# Patient Record
Sex: Male | Born: 1965 | Race: Black or African American | Hispanic: No | Marital: Single | Smoking: Current every day smoker
Health system: Southern US, Community
[De-identification: ages and names within clinical notes are randomized; demographics above are authoritative.]

## PROBLEM LIST (undated history)

## (undated) ENCOUNTER — Emergency Department (HOSPITAL_COMMUNITY): Admission: EM | Payer: Medicare Other | Source: Home / Self Care

## (undated) DIAGNOSIS — J40 Bronchitis, not specified as acute or chronic: Secondary | ICD-10-CM

## (undated) DIAGNOSIS — F1910 Other psychoactive substance abuse, uncomplicated: Secondary | ICD-10-CM

## (undated) DIAGNOSIS — L309 Dermatitis, unspecified: Secondary | ICD-10-CM

## (undated) DIAGNOSIS — K219 Gastro-esophageal reflux disease without esophagitis: Secondary | ICD-10-CM

## (undated) DIAGNOSIS — M1990 Unspecified osteoarthritis, unspecified site: Secondary | ICD-10-CM

## (undated) DIAGNOSIS — L0390 Cellulitis, unspecified: Secondary | ICD-10-CM

## (undated) DIAGNOSIS — A159 Respiratory tuberculosis unspecified: Secondary | ICD-10-CM

## (undated) DIAGNOSIS — R0602 Shortness of breath: Secondary | ICD-10-CM

## (undated) DIAGNOSIS — R05 Cough: Secondary | ICD-10-CM

## (undated) DIAGNOSIS — I82409 Acute embolism and thrombosis of unspecified deep veins of unspecified lower extremity: Secondary | ICD-10-CM

## (undated) DIAGNOSIS — F319 Bipolar disorder, unspecified: Secondary | ICD-10-CM

## (undated) DIAGNOSIS — Z720 Tobacco use: Secondary | ICD-10-CM

## (undated) DIAGNOSIS — F1010 Alcohol abuse, uncomplicated: Secondary | ICD-10-CM

## (undated) DIAGNOSIS — R011 Cardiac murmur, unspecified: Secondary | ICD-10-CM

## (undated) DIAGNOSIS — F32A Depression, unspecified: Secondary | ICD-10-CM

## (undated) DIAGNOSIS — M2550 Pain in unspecified joint: Secondary | ICD-10-CM

## (undated) DIAGNOSIS — I2699 Other pulmonary embolism without acute cor pulmonale: Secondary | ICD-10-CM

## (undated) DIAGNOSIS — R059 Cough, unspecified: Secondary | ICD-10-CM

## (undated) DIAGNOSIS — F329 Major depressive disorder, single episode, unspecified: Secondary | ICD-10-CM

## (undated) HISTORY — DX: Acute embolism and thrombosis of unspecified deep veins of unspecified lower extremity: I82.409

## (undated) HISTORY — DX: Other psychoactive substance abuse, uncomplicated: F19.10

## (undated) HISTORY — DX: Tobacco use: Z72.0

## (undated) HISTORY — DX: Gastro-esophageal reflux disease without esophagitis: K21.9

## (undated) HISTORY — DX: Depression, unspecified: F32.A

## (undated) HISTORY — DX: Major depressive disorder, single episode, unspecified: F32.9

---
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
## 1998-10-08 ENCOUNTER — Emergency Department (HOSPITAL_COMMUNITY): Admission: EM | Admit: 1998-10-08 | Discharge: 1998-10-08 | Payer: Self-pay

## 1999-01-28 ENCOUNTER — Emergency Department (HOSPITAL_COMMUNITY): Admission: EM | Admit: 1999-01-28 | Discharge: 1999-01-28 | Payer: Self-pay | Admitting: Emergency Medicine

## 1999-08-13 ENCOUNTER — Emergency Department (HOSPITAL_COMMUNITY): Admission: EM | Admit: 1999-08-13 | Discharge: 1999-08-13 | Payer: Self-pay | Admitting: Emergency Medicine

## 1999-08-13 ENCOUNTER — Encounter: Payer: Self-pay | Admitting: Emergency Medicine

## 1999-12-09 ENCOUNTER — Emergency Department (HOSPITAL_COMMUNITY): Admission: EM | Admit: 1999-12-09 | Discharge: 1999-12-09 | Payer: Self-pay | Admitting: Emergency Medicine

## 2000-01-31 ENCOUNTER — Emergency Department (HOSPITAL_COMMUNITY): Admission: EM | Admit: 2000-01-31 | Discharge: 2000-01-31 | Payer: Self-pay | Admitting: *Deleted

## 2000-08-23 ENCOUNTER — Emergency Department (HOSPITAL_COMMUNITY): Admission: EM | Admit: 2000-08-23 | Discharge: 2000-08-23 | Payer: Self-pay | Admitting: Emergency Medicine

## 2001-05-29 ENCOUNTER — Emergency Department (HOSPITAL_COMMUNITY): Admission: EM | Admit: 2001-05-29 | Discharge: 2001-05-29 | Payer: Self-pay | Admitting: Emergency Medicine

## 2001-06-10 ENCOUNTER — Emergency Department (HOSPITAL_COMMUNITY): Admission: EM | Admit: 2001-06-10 | Discharge: 2001-06-10 | Payer: Self-pay | Admitting: Emergency Medicine

## 2001-08-14 ENCOUNTER — Emergency Department (HOSPITAL_COMMUNITY): Admission: EM | Admit: 2001-08-14 | Discharge: 2001-08-14 | Payer: Self-pay

## 2002-03-18 ENCOUNTER — Emergency Department (HOSPITAL_COMMUNITY): Admission: EM | Admit: 2002-03-18 | Discharge: 2002-03-18 | Payer: Self-pay | Admitting: Emergency Medicine

## 2002-03-26 ENCOUNTER — Emergency Department (HOSPITAL_COMMUNITY): Admission: EM | Admit: 2002-03-26 | Discharge: 2002-03-26 | Payer: Self-pay | Admitting: Emergency Medicine

## 2003-05-21 ENCOUNTER — Emergency Department (HOSPITAL_COMMUNITY): Admission: EM | Admit: 2003-05-21 | Discharge: 2003-05-21 | Payer: Self-pay | Admitting: Emergency Medicine

## 2004-11-25 ENCOUNTER — Emergency Department (HOSPITAL_COMMUNITY): Admission: EM | Admit: 2004-11-25 | Discharge: 2004-11-25 | Payer: Self-pay | Admitting: Emergency Medicine

## 2005-07-26 ENCOUNTER — Emergency Department (HOSPITAL_COMMUNITY): Admission: EM | Admit: 2005-07-26 | Discharge: 2005-07-26 | Payer: Self-pay | Admitting: Emergency Medicine

## 2005-08-05 ENCOUNTER — Emergency Department (HOSPITAL_COMMUNITY): Admission: EM | Admit: 2005-08-05 | Discharge: 2005-08-06 | Payer: Self-pay | Admitting: Emergency Medicine

## 2006-02-06 ENCOUNTER — Ambulatory Visit: Payer: Self-pay | Admitting: *Deleted

## 2006-02-06 ENCOUNTER — Ambulatory Visit: Payer: Self-pay | Admitting: Internal Medicine

## 2006-02-06 ENCOUNTER — Ambulatory Visit (HOSPITAL_COMMUNITY): Admission: RE | Admit: 2006-02-06 | Discharge: 2006-02-06 | Payer: Self-pay | Admitting: *Deleted

## 2006-02-06 ENCOUNTER — Encounter: Payer: Self-pay | Admitting: Vascular Surgery

## 2006-02-06 ENCOUNTER — Inpatient Hospital Stay (HOSPITAL_COMMUNITY): Admission: EM | Admit: 2006-02-06 | Discharge: 2006-02-14 | Payer: Self-pay | Admitting: *Deleted

## 2006-02-16 ENCOUNTER — Ambulatory Visit: Payer: Self-pay | Admitting: Hospitalist

## 2006-02-21 ENCOUNTER — Ambulatory Visit: Payer: Self-pay | Admitting: Hospitalist

## 2006-03-19 ENCOUNTER — Emergency Department (HOSPITAL_COMMUNITY): Admission: EM | Admit: 2006-03-19 | Discharge: 2006-03-20 | Payer: Self-pay | Admitting: Emergency Medicine

## 2006-03-20 ENCOUNTER — Inpatient Hospital Stay (HOSPITAL_COMMUNITY): Admission: EM | Admit: 2006-03-20 | Discharge: 2006-03-24 | Payer: Self-pay | Admitting: Psychiatry

## 2006-03-20 ENCOUNTER — Ambulatory Visit: Payer: Self-pay | Admitting: Psychiatry

## 2006-03-24 ENCOUNTER — Inpatient Hospital Stay (HOSPITAL_COMMUNITY): Admission: EM | Admit: 2006-03-24 | Discharge: 2006-03-28 | Payer: Self-pay | Admitting: Emergency Medicine

## 2006-04-01 ENCOUNTER — Emergency Department (HOSPITAL_COMMUNITY): Admission: EM | Admit: 2006-04-01 | Discharge: 2006-04-01 | Payer: Self-pay | Admitting: *Deleted

## 2006-04-09 ENCOUNTER — Ambulatory Visit: Payer: Self-pay | Admitting: Hospitalist

## 2006-04-16 ENCOUNTER — Ambulatory Visit: Payer: Self-pay | Admitting: Internal Medicine

## 2006-05-21 ENCOUNTER — Ambulatory Visit: Payer: Self-pay | Admitting: Internal Medicine

## 2006-07-16 ENCOUNTER — Ambulatory Visit: Payer: Self-pay | Admitting: Internal Medicine

## 2006-08-06 ENCOUNTER — Ambulatory Visit: Payer: Self-pay | Admitting: Internal Medicine

## 2006-11-26 ENCOUNTER — Ambulatory Visit: Payer: Self-pay | Admitting: Internal Medicine

## 2006-11-26 DIAGNOSIS — I82409 Acute embolism and thrombosis of unspecified deep veins of unspecified lower extremity: Secondary | ICD-10-CM | POA: Insufficient documentation

## 2006-12-05 ENCOUNTER — Ambulatory Visit: Payer: Self-pay | Admitting: Internal Medicine

## 2006-12-05 DIAGNOSIS — F319 Bipolar disorder, unspecified: Secondary | ICD-10-CM

## 2006-12-10 ENCOUNTER — Ambulatory Visit: Payer: Self-pay | Admitting: Internal Medicine

## 2007-01-07 ENCOUNTER — Encounter (INDEPENDENT_AMBULATORY_CARE_PROVIDER_SITE_OTHER): Payer: Self-pay | Admitting: *Deleted

## 2007-01-07 ENCOUNTER — Ambulatory Visit: Payer: Self-pay | Admitting: Internal Medicine

## 2007-01-07 ENCOUNTER — Ambulatory Visit (HOSPITAL_COMMUNITY): Admission: RE | Admit: 2007-01-07 | Discharge: 2007-01-07 | Payer: Self-pay | Admitting: Internal Medicine

## 2007-01-07 ENCOUNTER — Ambulatory Visit: Payer: Self-pay | Admitting: Vascular Surgery

## 2007-01-07 ENCOUNTER — Encounter: Payer: Self-pay | Admitting: Internal Medicine

## 2007-01-07 DIAGNOSIS — M79609 Pain in unspecified limb: Secondary | ICD-10-CM | POA: Insufficient documentation

## 2007-01-07 LAB — CONVERTED CEMR LAB
Hemoglobin: 16.7 g/dL (ref 13.0–17.0)
INR: 2.2
Lymphocytes Relative: 33 % (ref 12–46)
Lymphs Abs: 1.9 10*3/uL (ref 0.7–3.3)
Monocytes Absolute: 0.6 10*3/uL (ref 0.2–0.7)
Monocytes Relative: 10 % (ref 3–11)
Neutro Abs: 3.2 10*3/uL (ref 1.7–7.7)
RBC: 5.24 M/uL (ref 4.22–5.81)

## 2007-01-14 ENCOUNTER — Ambulatory Visit: Payer: Self-pay | Admitting: Hospitalist

## 2007-02-04 ENCOUNTER — Ambulatory Visit: Payer: Self-pay | Admitting: Hospitalist

## 2007-02-25 ENCOUNTER — Ambulatory Visit: Payer: Self-pay | Admitting: Internal Medicine

## 2007-02-25 LAB — CONVERTED CEMR LAB: INR: 2.9

## 2007-03-28 ENCOUNTER — Ambulatory Visit: Payer: Self-pay | Admitting: Hospitalist

## 2007-03-29 LAB — CONVERTED CEMR LAB: INR: 2.6

## 2007-06-04 ENCOUNTER — Telehealth: Payer: Self-pay | Admitting: Internal Medicine

## 2007-06-10 ENCOUNTER — Ambulatory Visit: Payer: Self-pay | Admitting: Hospitalist

## 2007-06-10 LAB — CONVERTED CEMR LAB

## 2007-07-08 ENCOUNTER — Ambulatory Visit: Payer: Self-pay | Admitting: Internal Medicine

## 2007-07-09 ENCOUNTER — Encounter (INDEPENDENT_AMBULATORY_CARE_PROVIDER_SITE_OTHER): Payer: Self-pay | Admitting: Internal Medicine

## 2007-07-09 ENCOUNTER — Ambulatory Visit: Payer: Self-pay | Admitting: Hospitalist

## 2007-07-29 ENCOUNTER — Ambulatory Visit: Payer: Self-pay | Admitting: Internal Medicine

## 2007-07-29 LAB — CONVERTED CEMR LAB

## 2007-10-07 ENCOUNTER — Ambulatory Visit: Payer: Self-pay | Admitting: Internal Medicine

## 2007-10-07 LAB — CONVERTED CEMR LAB: INR: 2.6

## 2007-12-23 ENCOUNTER — Ambulatory Visit: Payer: Self-pay | Admitting: Internal Medicine

## 2007-12-23 LAB — CONVERTED CEMR LAB

## 2008-02-03 ENCOUNTER — Ambulatory Visit: Payer: Self-pay | Admitting: Internal Medicine

## 2008-02-03 LAB — CONVERTED CEMR LAB: INR: 1.5

## 2008-06-01 ENCOUNTER — Ambulatory Visit: Payer: Self-pay | Admitting: *Deleted

## 2008-06-29 ENCOUNTER — Ambulatory Visit: Payer: Self-pay | Admitting: Infectious Diseases

## 2008-06-29 LAB — CONVERTED CEMR LAB: INR: 1.5

## 2008-07-01 ENCOUNTER — Emergency Department (HOSPITAL_COMMUNITY): Admission: EM | Admit: 2008-07-01 | Discharge: 2008-07-01 | Payer: Self-pay | Admitting: Emergency Medicine

## 2008-07-13 ENCOUNTER — Ambulatory Visit: Payer: Self-pay | Admitting: Internal Medicine

## 2008-07-13 LAB — CONVERTED CEMR LAB: INR: 2.5

## 2008-10-19 ENCOUNTER — Ambulatory Visit: Payer: Self-pay | Admitting: Internal Medicine

## 2008-10-19 ENCOUNTER — Encounter: Payer: Self-pay | Admitting: Internal Medicine

## 2008-10-19 LAB — CONVERTED CEMR LAB: INR: 2.4

## 2008-11-16 ENCOUNTER — Emergency Department (HOSPITAL_COMMUNITY): Admission: EM | Admit: 2008-11-16 | Discharge: 2008-11-16 | Payer: Self-pay | Admitting: Emergency Medicine

## 2008-11-23 ENCOUNTER — Ambulatory Visit: Payer: Self-pay | Admitting: Internal Medicine

## 2009-01-11 ENCOUNTER — Emergency Department (HOSPITAL_COMMUNITY): Admission: EM | Admit: 2009-01-11 | Discharge: 2009-01-12 | Payer: Self-pay | Admitting: Emergency Medicine

## 2009-01-12 ENCOUNTER — Ambulatory Visit: Payer: Self-pay | Admitting: Vascular Surgery

## 2009-01-12 ENCOUNTER — Ambulatory Visit: Payer: Self-pay | Admitting: Infectious Diseases

## 2009-01-12 ENCOUNTER — Encounter (INDEPENDENT_AMBULATORY_CARE_PROVIDER_SITE_OTHER): Payer: Self-pay | Admitting: Emergency Medicine

## 2009-01-12 ENCOUNTER — Ambulatory Visit (HOSPITAL_COMMUNITY): Admission: RE | Admit: 2009-01-12 | Discharge: 2009-01-12 | Payer: Self-pay | Admitting: Emergency Medicine

## 2009-01-12 ENCOUNTER — Telehealth (INDEPENDENT_AMBULATORY_CARE_PROVIDER_SITE_OTHER): Payer: Self-pay | Admitting: Internal Medicine

## 2009-01-14 ENCOUNTER — Encounter: Payer: Self-pay | Admitting: Internal Medicine

## 2009-01-25 ENCOUNTER — Ambulatory Visit: Payer: Self-pay | Admitting: Internal Medicine

## 2009-01-25 LAB — CONVERTED CEMR LAB: INR: 3.6

## 2009-02-16 ENCOUNTER — Emergency Department (HOSPITAL_COMMUNITY): Admission: EM | Admit: 2009-02-16 | Discharge: 2009-02-17 | Payer: Self-pay | Admitting: Emergency Medicine

## 2009-02-22 ENCOUNTER — Ambulatory Visit: Payer: Self-pay | Admitting: Internal Medicine

## 2009-02-22 LAB — CONVERTED CEMR LAB: INR: 5.6

## 2009-03-02 ENCOUNTER — Emergency Department (HOSPITAL_COMMUNITY): Admission: EM | Admit: 2009-03-02 | Discharge: 2009-03-02 | Payer: Self-pay | Admitting: Emergency Medicine

## 2009-03-22 ENCOUNTER — Ambulatory Visit: Payer: Self-pay | Admitting: Infectious Diseases

## 2009-03-22 LAB — CONVERTED CEMR LAB: INR: 3.3

## 2009-04-05 ENCOUNTER — Ambulatory Visit: Payer: Self-pay | Admitting: Infectious Diseases

## 2009-04-05 LAB — CONVERTED CEMR LAB

## 2009-04-26 ENCOUNTER — Ambulatory Visit: Payer: Self-pay | Admitting: Internal Medicine

## 2009-07-12 ENCOUNTER — Emergency Department (HOSPITAL_COMMUNITY): Admission: EM | Admit: 2009-07-12 | Discharge: 2009-07-12 | Payer: Self-pay | Admitting: Emergency Medicine

## 2009-08-02 ENCOUNTER — Ambulatory Visit: Payer: Self-pay | Admitting: Internal Medicine

## 2009-08-23 ENCOUNTER — Ambulatory Visit: Payer: Self-pay | Admitting: Internal Medicine

## 2009-08-23 LAB — CONVERTED CEMR LAB: INR: 3.3

## 2009-09-03 ENCOUNTER — Ambulatory Visit: Payer: Self-pay | Admitting: Internal Medicine

## 2009-09-03 LAB — CONVERTED CEMR LAB
Cholesterol: 241 mg/dL — ABNORMAL HIGH (ref 0–200)
HDL: 40 mg/dL (ref >39)
Triglycerides: 281 mg/dL — ABNORMAL HIGH (ref <150)

## 2009-10-31 ENCOUNTER — Emergency Department (HOSPITAL_COMMUNITY): Admission: EM | Admit: 2009-10-31 | Discharge: 2009-10-31 | Payer: Self-pay | Admitting: Emergency Medicine

## 2009-11-08 ENCOUNTER — Ambulatory Visit: Payer: Self-pay | Admitting: Internal Medicine

## 2009-11-23 ENCOUNTER — Emergency Department (HOSPITAL_COMMUNITY): Admission: EM | Admit: 2009-11-23 | Discharge: 2009-11-24 | Payer: Self-pay | Admitting: Emergency Medicine

## 2009-12-06 ENCOUNTER — Emergency Department (HOSPITAL_COMMUNITY): Admission: EM | Admit: 2009-12-06 | Discharge: 2009-12-06 | Payer: Self-pay | Admitting: Emergency Medicine

## 2009-12-14 ENCOUNTER — Emergency Department (HOSPITAL_COMMUNITY): Admission: EM | Admit: 2009-12-14 | Discharge: 2009-12-14 | Payer: Self-pay | Admitting: Emergency Medicine

## 2010-01-19 ENCOUNTER — Emergency Department (HOSPITAL_COMMUNITY): Admission: EM | Admit: 2010-01-19 | Discharge: 2010-01-19 | Payer: Self-pay | Admitting: Emergency Medicine

## 2010-01-31 ENCOUNTER — Telehealth (INDEPENDENT_AMBULATORY_CARE_PROVIDER_SITE_OTHER): Payer: Self-pay | Admitting: *Deleted

## 2010-02-11 ENCOUNTER — Emergency Department (HOSPITAL_COMMUNITY): Admission: EM | Admit: 2010-02-11 | Discharge: 2010-02-11 | Payer: Self-pay | Admitting: Emergency Medicine

## 2010-02-13 ENCOUNTER — Emergency Department (HOSPITAL_COMMUNITY): Admission: EM | Admit: 2010-02-13 | Discharge: 2010-02-14 | Payer: Self-pay | Admitting: Emergency Medicine

## 2010-02-14 ENCOUNTER — Ambulatory Visit: Payer: Self-pay | Admitting: Internal Medicine

## 2010-02-14 LAB — CONVERTED CEMR LAB: INR: 2.1

## 2010-02-20 ENCOUNTER — Emergency Department (HOSPITAL_COMMUNITY): Admission: EM | Admit: 2010-02-20 | Discharge: 2010-02-20 | Payer: Self-pay | Admitting: Emergency Medicine

## 2010-04-03 ENCOUNTER — Emergency Department (HOSPITAL_COMMUNITY): Admission: EM | Admit: 2010-04-03 | Discharge: 2010-04-03 | Payer: Self-pay | Admitting: Emergency Medicine

## 2010-04-11 ENCOUNTER — Emergency Department (HOSPITAL_COMMUNITY): Admission: EM | Admit: 2010-04-11 | Discharge: 2010-04-11 | Payer: Self-pay | Admitting: Family Medicine

## 2010-04-18 ENCOUNTER — Emergency Department (HOSPITAL_COMMUNITY): Admission: EM | Admit: 2010-04-18 | Discharge: 2010-04-19 | Payer: Self-pay | Admitting: Emergency Medicine

## 2010-04-19 ENCOUNTER — Ambulatory Visit: Payer: Self-pay | Admitting: Internal Medicine

## 2010-04-19 ENCOUNTER — Inpatient Hospital Stay (HOSPITAL_COMMUNITY): Admission: AD | Admit: 2010-04-19 | Discharge: 2010-04-23 | Payer: Self-pay | Admitting: Internal Medicine

## 2010-04-19 ENCOUNTER — Encounter: Payer: Self-pay | Admitting: Internal Medicine

## 2010-04-19 ENCOUNTER — Telehealth: Payer: Self-pay | Admitting: Internal Medicine

## 2010-04-23 ENCOUNTER — Encounter: Payer: Self-pay | Admitting: Internal Medicine

## 2010-04-23 DIAGNOSIS — J45901 Unspecified asthma with (acute) exacerbation: Secondary | ICD-10-CM | POA: Insufficient documentation

## 2010-04-23 DIAGNOSIS — J40 Bronchitis, not specified as acute or chronic: Secondary | ICD-10-CM

## 2010-05-02 ENCOUNTER — Ambulatory Visit: Payer: Self-pay | Admitting: Internal Medicine

## 2010-05-02 DIAGNOSIS — J4540 Moderate persistent asthma, uncomplicated: Secondary | ICD-10-CM

## 2010-05-02 DIAGNOSIS — K219 Gastro-esophageal reflux disease without esophagitis: Secondary | ICD-10-CM | POA: Insufficient documentation

## 2010-05-02 LAB — CONVERTED CEMR LAB: INR: 1.8

## 2010-05-05 ENCOUNTER — Emergency Department (HOSPITAL_COMMUNITY): Admission: EM | Admit: 2010-05-05 | Discharge: 2010-05-05 | Payer: Self-pay | Admitting: Family Medicine

## 2010-05-06 ENCOUNTER — Ambulatory Visit (HOSPITAL_COMMUNITY): Admission: RE | Admit: 2010-05-06 | Discharge: 2010-05-06 | Payer: Self-pay | Admitting: Internal Medicine

## 2010-05-06 ENCOUNTER — Encounter: Payer: Self-pay | Admitting: Internal Medicine

## 2010-05-09 ENCOUNTER — Ambulatory Visit: Payer: Self-pay | Admitting: Internal Medicine

## 2010-05-09 LAB — CONVERTED CEMR LAB: INR: 2.3

## 2010-05-14 ENCOUNTER — Emergency Department (HOSPITAL_COMMUNITY)
Admission: EM | Admit: 2010-05-14 | Discharge: 2010-05-15 | Payer: Self-pay | Source: Home / Self Care | Admitting: Emergency Medicine

## 2010-05-23 ENCOUNTER — Ambulatory Visit: Payer: Self-pay | Admitting: Internal Medicine

## 2010-06-07 ENCOUNTER — Emergency Department (HOSPITAL_COMMUNITY)
Admission: EM | Admit: 2010-06-07 | Discharge: 2010-06-07 | Payer: Self-pay | Source: Home / Self Care | Admitting: Emergency Medicine

## 2010-06-09 ENCOUNTER — Emergency Department (HOSPITAL_COMMUNITY)
Admission: EM | Admit: 2010-06-09 | Discharge: 2010-06-09 | Payer: Self-pay | Source: Home / Self Care | Admitting: Emergency Medicine

## 2010-06-16 ENCOUNTER — Emergency Department (HOSPITAL_COMMUNITY)
Admission: EM | Admit: 2010-06-16 | Discharge: 2010-06-16 | Payer: Self-pay | Source: Home / Self Care | Admitting: Family Medicine

## 2010-06-20 ENCOUNTER — Emergency Department (HOSPITAL_COMMUNITY)
Admission: EM | Admit: 2010-06-20 | Discharge: 2010-06-20 | Payer: Self-pay | Source: Home / Self Care | Admitting: Emergency Medicine

## 2010-06-23 ENCOUNTER — Emergency Department (HOSPITAL_COMMUNITY)
Admission: EM | Admit: 2010-06-23 | Discharge: 2010-06-23 | Payer: Self-pay | Source: Home / Self Care | Admitting: Emergency Medicine

## 2010-06-27 ENCOUNTER — Ambulatory Visit: Payer: Self-pay | Admitting: Internal Medicine

## 2010-06-27 LAB — CONVERTED CEMR LAB

## 2010-06-28 ENCOUNTER — Telehealth: Payer: Self-pay | Admitting: *Deleted

## 2010-06-28 ENCOUNTER — Emergency Department (HOSPITAL_COMMUNITY)
Admission: EM | Admit: 2010-06-28 | Discharge: 2010-06-28 | Payer: Self-pay | Source: Home / Self Care | Admitting: Emergency Medicine

## 2010-06-28 ENCOUNTER — Encounter (INDEPENDENT_AMBULATORY_CARE_PROVIDER_SITE_OTHER): Payer: Self-pay | Admitting: Emergency Medicine

## 2010-07-01 ENCOUNTER — Emergency Department (HOSPITAL_COMMUNITY)
Admission: EM | Admit: 2010-07-01 | Discharge: 2010-07-01 | Payer: Self-pay | Source: Home / Self Care | Admitting: Emergency Medicine

## 2010-07-04 ENCOUNTER — Emergency Department (HOSPITAL_COMMUNITY)
Admission: EM | Admit: 2010-07-04 | Discharge: 2010-07-05 | Disposition: A | Discharge status: Other Acute Inpatient Hospital | Payer: Self-pay | Source: Home / Self Care | Admitting: Emergency Medicine

## 2010-07-05 ENCOUNTER — Ambulatory Visit: Payer: Self-pay

## 2010-07-05 ENCOUNTER — Encounter: Payer: Self-pay | Admitting: Internal Medicine

## 2010-07-05 ENCOUNTER — Observation Stay (HOSPITAL_COMMUNITY)
Admission: EM | Admit: 2010-07-05 | Discharge: 2010-07-09 | Payer: Self-pay | Attending: Internal Medicine | Admitting: Internal Medicine

## 2010-07-09 ENCOUNTER — Encounter: Payer: Self-pay | Admitting: Ophthalmology

## 2010-07-12 ENCOUNTER — Telehealth: Payer: Self-pay | Admitting: Internal Medicine

## 2010-07-13 ENCOUNTER — Ambulatory Visit: Admission: RE | Admit: 2010-07-13 | Discharge: 2010-07-13 | Payer: Self-pay | Source: Home / Self Care

## 2010-07-13 DIAGNOSIS — F1910 Other psychoactive substance abuse, uncomplicated: Secondary | ICD-10-CM | POA: Insufficient documentation

## 2010-07-18 ENCOUNTER — Ambulatory Visit: Admission: RE | Admit: 2010-07-18 | Discharge: 2010-07-18 | Payer: Self-pay | Source: Home / Self Care

## 2010-07-18 LAB — CONVERTED CEMR LAB: INR: 1.5

## 2010-07-29 DIAGNOSIS — Z7901 Long term (current) use of anticoagulants: Secondary | ICD-10-CM | POA: Insufficient documentation

## 2010-07-29 DIAGNOSIS — I82409 Acute embolism and thrombosis of unspecified deep veins of unspecified lower extremity: Secondary | ICD-10-CM

## 2010-08-01 ENCOUNTER — Ambulatory Visit: Admit: 2010-08-01 | Payer: Self-pay

## 2010-08-03 ENCOUNTER — Emergency Department (HOSPITAL_COMMUNITY)
Admission: EM | Admit: 2010-08-03 | Discharge: 2010-08-03 | Payer: Self-pay | Source: Home / Self Care | Admitting: Emergency Medicine

## 2010-08-09 NOTE — Assessment & Plan Note (Signed)
Summary: [name]  Anticoagulant Therapy Managed by: [name]. [name] [name]  PharmD CACP PCP: [name]  DO [facility] Attending: [name] MD, [name] Indication 1: Deep vein thrombus Indication 2: Aftercare long term use Anticoagulants V58.61,V58.83 Start date: 03/21/2006 Duration: 1 year  Patient Assessment Reviewed by: [name] PharmD  August 23, 2009 Medication review: verified warfarin dosage & schedule,verified previous prescription medications, verified doses & any changes, verified new medications, reviewed OTC medications, reviewed OTC health products-vitamins supplements etc Complications: none Dietary changes: none   Health status changes: none   Lifestyle changes: none   Recent/future hospitalizations: none   Recent/future procedures: none   Recent/future dental: none Patient Assessment Part 2:  Have you MISSED ANY DOSES or CHANGED TABLETS?  No missed Warfarin doses or changed tablets.  Have you had any BRUISING or BLEEDING ( nose or gum bleeds,blood in urine or stool)?  No reported bruising or bleeding in nose, gums, urine, stool.  Have you STARTED or STOPPED any MEDICATIONS, including OTC meds,herbals or supplements?  No other medications or herbal supplements were started or stopped.  Have you CHANGED your DIET, especially green vegetables,or ALCOHOL intake?  No changes in diet or alcohol intake.  Have you had any ILLNESSES or HOSPITALIZATIONS?  No reported illnesses or hospitalizations  Have you had any signs of CLOTTING?(chest discomfort,dizziness,shortness of breath,arms tingling,slurred speech,swelling or redness in leg)    No chest discomfort, dizziness, shortness of breath, tingling in arm, slurred speech, swelling, or redness in leg.     Treatment  Target INR: 2.0-3.0 INR: 3.3  Date: 08/23/2009 Regimen In:  95.0mg /week INR reflects regimen in: 3.3  New  Tablet strength: : 5mg  Regimen Out:     Sunday: 2 & 1/2 Tablet     Monday: 2 & 1/2 Tablet  Tuesday: 2 & 1/2 Tablet     Wednesday: 3 Tablet     Thursday: 2 & 1/2 Tablet      Friday: 2 & 1/2 Tablet     Saturday: 2 & 1/2 Tablet Total Weekly: 90.0mg /week mg  Next INR Due: 09/20/2009 Adjusted by: [name]. Alexandria Lodge III PharmD CACP   Return to anticoagulation clinic:  09/20/2009 Time of next visit: 1130    Allergies: No Known Drug Allergies

## 2010-08-09 NOTE — Progress Notes (Signed)
Summary: Refill/gh  Phone Note Refill Request Message from:  Patient on January 31, 2010 3:03 PM  Refills Requested: Medication #1:  WARFARIN SODIUM 5 MG TABS Take as directed. Last vivit to [facility] was 11/08/2009   Method Requested: Electronic Initial call taken by: Angelina Ok RN,  January 31, 2010 3:03 PM    Prescriptions: WARFARIN SODIUM 5 MG TABS (WARFARIN SODIUM) Take as directed.  #150 Each x 1   Entered and Authorized by:   Zoila Shutter MD   Signed by:   Zoila Shutter MD on 01/31/2010   Method used:   Electronically to        [facility] [phone]* (retail)       [address]       [place], [place]  [address]       Ph: [phone]       Fax: [phone]   RxID:   [id number]

## 2010-08-09 NOTE — Assessment & Plan Note (Signed)
Summary: FU VISIT/DS   Vital Signs:  Patient profile:   45 year old male Height:      76 inches (193.04 cm) Weight:      235.8 pounds (107.18 kg) BMI:     28.81 Temp:     97.3 degrees F (36.28 degrees C) Pulse rate:   82 / minute BP sitting:   119 / 71  (left arm) Cuff size:   regular  Vitals Entered By: Krystal Eaton Duncan Dull) (September 03, 2009 2:48 PM) Is Patient Diabetic? No Pain Assessment Patient in pain? yes     Location: right leg Intensity: 3 Type: sharp Onset of pain  Intermittent s/p dvt 43yrs ago  Have you ever been in a relationship where you felt threatened, hurt or afraid?No   Does patient need assistance? Functional Status Self care Ambulation Normal   Primary Care Provider:  [name]  DO   History of Present Illness: This is a 45 year old mane with past medical history of   Anticoagulation therapy DVT x 2 Hx of depression  He is here for check up.  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  No other complaitns.     Depression History:      The patient denies a depressed mood most of the day and a diminished interest in his usual daily activities.        Preventive Screening-Counseling & Management  Alcohol-Tobacco     Alcohol drinks/day: 0     Smoking Status: current     Smoking Cessation Counseling: yes     Packs/Day: 1/2     Year Started: 1990  Current Medications (verified): 1)  Warfarin Sodium 5 Mg Tabs (Warfarin Sodium) .... Take As Directed. 2)  Compression Stockings. . Knee Length  Allergies (verified): No Known Drug Allergies  Social History: Occupation: detailing cars separated Regular exercise-no (leg pain prevents)  Review of Systems       per hpi  Physical Exam  General:  alert and well-developed.   Lungs:  normal respiratory effort and normal breath sounds.   Heart:  normal rate, regular rhythm, and no murmur.   Pulses:  +1 Extremities:  no  edema Neurologic:  alert & oriented X3, cranial nerves II-XII intact, and strength normal in all extremities.     Impression & Recommendations:  Problem # 1:  LEG PAIN, RIGHT (ICD-729.5) post phlebitic syndrome. will write again for compression stockings as these helped in the past. no medications needed.  encouraged as much physical activity as possible.  Problem # 2:  DEPRESSION (ICD-311) no current concerns with this.  Problem # 3:  ANTICOAGULATION THERAPY (ICD-V58.61) follows with dr gross  Complete Medication List: 1)  Warfarin Sodium 5 Mg Tabs (Warfarin sodium) .... Take as directed. 2)  Compression Stockings. . Knee Length   Other Orders: T-Lipid Profile (28315-17616) Admin 1st Vaccine (07371) Flu Vaccine 57yrs + (06269)   Patient Instructions: 1)  You should wear compression socks when standing to prevent leg swelling and pain. 2)  You had labwork done today, we will call you if there is anything that needs to be addressed. 3)  Tobacco is very bad for your health and your loved ones! You Should stop smoking!. Process Orders Check Orders Results:     [facility]: ABN not required for this insurance Tests Sent for requisitioning (September 15, 2009 2:09 PM):     09/03/2009: [facility] -- T-Lipid Profile [phone] (  signed)    Prevention & Chronic Care Immunizations   Influenza vaccine: Fluvax 3+  (09/03/2009)    Tetanus booster: Not documented    Pneumococcal vaccine: Not documented  Other Screening   Smoking status: current  (09/03/2009)   Smoking cessation counseling: yes  (09/03/2009)  Lipids   Total Cholesterol: Not documented   Lipid panel action/deferral: Lipid Panel ordered   LDL: Not documented   LDL Direct: Not documented   HDL: Not documented   Triglycerides: Not documented   Nursing Instructions: Give Flu vaccine today  Flu Vaccine Consent Questions     Do you have a history of severe allergic  reactions to this vaccine? no    Any prior history of allergic reactions to egg and/or gelatin? no    Do you have a sensitivity to the preservative Thimersol? no    Do you have a past history of Guillan-Barre Syndrome? no    Do you currently have an acute febrile illness? no    Have you ever had a severe reaction to latex? no    Vaccine information given and explained to patient? yes    Are you currently pregnant? no    Lot [id number]  Exp Date:10/2009   Manufacturer: [employer]    Site Given  rightDeltoid IM.Krystal Eaton Duncan Dull)  September 03, 2009 3:48 PM    Triglycerides: Not documented   Nursing Instructions: Give Flu vaccine today   .mchsflu

## 2010-08-09 NOTE — Assessment & Plan Note (Signed)
Summary: 10:00am/cfb  Anticoagulant Therapy Managed by: [name]. Janie Morning  PharmD CACP PCP: [name]  DO [facility] Attending: Josem Kaufmann MD, Lawrence Indication 1: Deep vein thrombus Indication 2: Aftercare long term use Anticoagulants V58.61,V58.83 Start date: 03/21/2006 Duration: 1 year  Patient Assessment Reviewed by: [name] PharmD  February 14, 2010 Medication review: verified warfarin dosage & schedule,verified previous prescription medications, verified doses & any changes, verified new medications, reviewed OTC medications, reviewed OTC health products-vitamins supplements etc Complications: none Dietary changes: none   Health status changes: none   Lifestyle changes: none   Recent/future hospitalizations: none   Recent/future procedures: none   Recent/future dental: none Patient Assessment Part 2:  Have you MISSED ANY DOSES or CHANGED TABLETS?  No missed Warfarin doses or changed tablets.  Have you had any BRUISING or BLEEDING ( nose or gum bleeds,blood in urine or stool)?  No reported bruising or bleeding in nose, gums, urine, stool.  Have you STARTED or STOPPED any MEDICATIONS, including OTC meds,herbals or supplements?  No other medications or herbal supplements were started or stopped.  Have you CHANGED your DIET, especially green vegetables,or ALCOHOL intake?  No changes in diet or alcohol intake.  Have you had any ILLNESSES or HOSPITALIZATIONS?  No reported illnesses or hospitalizations  Have you had any signs of CLOTTING?(chest discomfort,dizziness,shortness of breath,arms tingling,slurred speech,swelling or redness in leg)    No chest discomfort, dizziness, shortness of breath, tingling in arm, slurred speech, swelling, or redness in leg.     Treatment  Target INR: 2.0-3.0 INR: 2.1  Date: 02/14/2010 Regimen In:  90.0mg /week INR reflects regimen in: 2.1  New  Tablet strength: : 5mg  Regimen Out:     Sunday: 2 & 1/2 Tablet     Monday: 3 Tablet  Tuesday: 2 & 1/2 Tablet     Wednesday: 3 Tablet     Thursday: 2 & 1/2 Tablet      Friday: 3 Tablet     Saturday: 2 & 1/2 Tablet Total Weekly: 95.0mg /week mg  Next INR Due: 03/07/2010 Adjusted by: [name]. 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  Thus the duration of therapy should be life long rather than 1 year.

## 2010-08-09 NOTE — Progress Notes (Signed)
Summary: f/u ED visit/ hla  Phone Note Outgoing Call   Summary of Call: i have called the ph# listed, got vmail and left a message, he called back at [phone] and refused to go to [facility] stating he would rather come to clinic, he states he is no better but does refuse ED visit Initial call taken by: Marin Roberts RN,  April 19, 2010 10:22 AM  Follow-up for Phone Call        spoke w/ dr [name], will do direct admit, called bed control, they have no beds at present, will call as soon as they do, spoke w/ pt, he seems relieved, resp status sounds worse than when i first spoke w/ him, again he is ask to go to ED and he says he wants to wait, he is cautioned that he may need to call 911, he voices understanding but conts to want to wait Follow-up by: Marin Roberts RN,  April 19, 2010 10:38 AM  Additional Follow-up for Phone Call Additional follow up Details #1::        Discussed with [facility].  Reviewed ED report.  Pt has had issues obtaining medications and per records will not have access until 10/20.  Was d/c'd from ED after continuous nebs and steroids.  Given course of steroids and zithromax along with recommended albuterol inhaler.  However, pt reports not better and concerned about pt actually being able to obtain necessary meds.  Also has h/o DVT and INR not checked since 8/8.  Was supposed to recheck 8/29 but no new values in systems.    Based on above and pt refusing to re-visit ED, will direct admit for observation, steroids and nebs.  Instructed to call 911 if worsening of symptoms before bed available.  Additional Follow-up by: Mariea Stable MD,  April 19, 2010 10:47 AM     Appended Document: f/u ED visit/ hla pt will be admitted to 5530, pt notified he is leaving now

## 2010-08-09 NOTE — Assessment & Plan Note (Signed)
Summary: ER/FU/ SB.   Vital Signs:  Patient profile:   45 year old male Height:      76 inches Weight:      244 pounds BMI:     29.81 O2 Sat:      99 % on Room air Temp:     97.1 degrees F Pulse rate:   83 / minute BP sitting:   114 / 75  (right arm) Cuff size:   regular  Vitals Entered By: Angelina Ok RN (May 23, 2010 9:24 AM)  O2 Flow:  Room air CC: Depression Is Patient Diabetic? No Pain Assessment Patient in pain? yes     Location: neck Intensity: 4 Type: sore Onset of pain  With activity Nutritional Status BMI of 25 - 29 = overweight  Have you ever been in a relationship where you felt threatened, hurt or afraid?No   Does patient need assistance? Functional Status Self care Ambulation Normal Comments ER follow up.  Went for shortness of breath.  Still has some.  Not as bad.  Needs presciption for Advair.   Primary Care Provider:  Whitney Post MD  CC:  Depression.  History of Present Illness: 45 yr old man with pmh of DVT, asthma and tobacco abuse who came here for f/u for recent ED visit. He had nonrpoductive cough and sob a week ago and went ED. He was given azithromycin for 4 days. His symptoms had resolved in 2 days. Now he has been doing well, no c/o, including fever, SOB, CP or active bleeding or dark stool. Current smoker, about 2 cig per day.  His acid refulx also resloved. He has been taking his meds as instructed.   Depression History:      The patient denies a depressed mood most of the day and a diminished interest in his usual daily activities.         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 Take 10mg  Tonight (10/15). Then Take 12.5mg  On M/w/f/sun and 15mg  On T/th/sat. 2)  Compression Stockings. . Knee Length 3)  Pepcid Ac Maximum Strength 20 Mg Tabs (Famotidine) .... Take 2 Tablets By Mouth Two Times A Day 4)  Albuterol Sulfate (2.5 Mg/12ml) 0.083% Nebu (Albuterol Sulfate) .... Inhale Three Times A Day and Every 3 Hours As Needed For Wheezing or Shortness of Breath 5)  Ventolin Hfa 108 (90 Base) Mcg/act Aers (Albuterol Sulfate) .... Inhale 2 Puffs Every 4 To 6 Hours As Needed 6)  Advair Diskus 250-50 Mcg/dose Aepb (Fluticasone-Salmeterol) .Marland Kitchen.. 1 Puff Inhaled Two Times A Day For Asthma 7)  Prilosec Otc 20 Mg Tbec (Omeprazole Magnesium) .... Take 1 Tablet By Mouth Two Times A Day  Current Medications (verified): 1)  Warfarin Sodium 5 Mg Tabs (Warfarin Sodium) .... Take 10mg  Tonight (10/15). Then Take 12.5mg  On M/w/f/sun and 15mg  On T/th/sat. 2)  Compression Stockings. . Knee Length 3)  Pepcid Ac Maximum Strength 20 Mg Tabs (Famotidine) .... Take 2 Tablets By Mouth Two Times A Day 4)  Albuterol  Sulfate (2.5 Mg/14ml) 0.083% Nebu (Albuterol Sulfate) .... Inhale Three Times A Day and Every 3 Hours As Needed For Wheezing or Shortness of Breath 5)  Ventolin Hfa 108 (90 Base) Mcg/act Aers (Albuterol Sulfate) .... Inhale 2 Puffs Every 4 To 6 Hours As Needed 6)  Advair Diskus 250-50 Mcg/dose Aepb (Fluticasone-Salmeterol) .Marland Kitchen.. 1 Puff Inhaled Two Times A Day For Asthma  Allergies (verified): No Known Drug Allergies  Past History:  Past Medical History: Last updated: 07/09/2007 Anticoagulation therapy DVT x 2 Hx of depression  Social History: Last updated: 09/03/2009 Occupation: detailing cars separated Regular exercise-no (leg pain prevents)  Risk Factors: Smoking Status: quit (05/02/2010) Packs/Day: 1/2 (05/02/2010)  Family  History: Reviewed history from 01/12/2009 and no changes required. no family members with clots. no one with CAD, DM or stroke. multiple members with HTN.  Social History: Reviewed history from 09/03/2009 and no changes required. Occupation: detailing cars separated Regular exercise-no (leg pain prevents)  Review of Systems  The patient denies fever, hoarseness, chest pain, syncope, dyspnea on exertion, peripheral edema, prolonged cough, headaches, abdominal pain, and melena.    Physical Exam  General:  alert, well-developed, well-nourished, and well-hydrated.   Head:  normocephalic.   Ears:  ear piercing(s) noted.   Nose:  no nasal discharge.   Mouth:  pharynx pink and moist.   Neck:  supple.   Lungs:  normal respiratory effort, normal breath sounds, no crackles, and no wheezes.   Heart:  normal rate, regular rhythm, no murmur, and no JVD.   Abdomen:  soft, non-tender, normal bowel sounds, and no distention.   Msk:  normal ROM, no joint tenderness, no joint swelling, no joint warmth, and no redness over joints.   Pulses:  2+ Extremities:  No edema.  Neurologic:  alert & oriented X3, cranial nerves II-XII intact, strength normal in all extremities, sensation intact to light touch, and gait normal.     Impression & Recommendations:  Problem # 1:  ASTHMA (ICD-493.90) Assessment Improved He has no SOB, good O2Sat. Will continue current regimen. Advised him to quit smoking, which will decrease recurrence.  His updated medication list for this problem includes:    Albuterol Sulfate (2.5 Mg/76ml) 0.083% Nebu (Albuterol sulfate) ..... Inhale three times a day and every 3 hours as needed for wheezing or shortness of breath    Ventolin Hfa 108 (90 Base) Mcg/act Aers (Albuterol sulfate) ..... Inhale 2 puffs every 4 to 6 hours as needed    Advair Diskus 250-50 Mcg/dose Aepb (Fluticasone-salmeterol) .Marland Kitchen... 1 puff inhaled two times a day for asthma  Pulmonary Functions Reviewed: O2 sat:  99 (05/23/2010)  Problem # 2:  GERD (ICD-530.81) Assessment: Improved Resolved. Continue pepcid.  The following medications were removed from the medication list:    Prilosec Otc 20 Mg Tbec (Omeprazole magnesium) .Marland Kitchen... Take 1 tablet by mouth two times a day His updated medication list for this problem includes:    Pepcid Ac Maximum Strength 20 Mg Tabs (Famotidine) .Marland Kitchen... Take 2 tablets by mouth two times a day  Problem # 3:  DVT (ICD-453.40) Assessment: Comment Only Recent INR 2.3 and at goal on coumadin, has been f/u by Dr. Alexandria Lodge.  Complete Medication List: 1)  Warfarin Sodium 5 Mg Tabs (Warfarin sodium) .... Take 10mg  tonight (10/15). then take 12.5mg  on m/w/f/sun and 15mg  on t/th/sat. 2)  Compression Stockings. . Knee Length  3)  Pepcid Ac Maximum Strength 20 Mg Tabs (Famotidine) .... Take 2 tablets by mouth two times a day  4)  Albuterol Sulfate (2.5 Mg/39ml) 0.083% Nebu (Albuterol sulfate) .... Inhale three times a day and every 3 hours as needed for wheezing or shortness of breath 5)  Ventolin Hfa 108 (90 Base) Mcg/act Aers (Albuterol sulfate) .... Inhale 2 puffs every 4 to 6 hours as needed 6)  Advair Diskus 250-50 Mcg/dose Aepb (Fluticasone-salmeterol) .Marland Kitchen.. 1 puff inhaled two times a day for asthma  Patient Instructions: 1)  Please schedule a follow-up appointment in 4-5 months. 2)  Tobacco is very bad for your health and your loved ones! You Should stop smoking!. 3)  It is important that you exercise regularly at least 20 minutes 5 times a week. If you develop chest pain, have severe difficulty breathing, or feel very tired , stop exercising immediately and seek medical attention.   Orders Added: 1)  Est. Patient Level III [44034]   Immunization History:  Influenza Immunization History:    Influenza:  historical (04/20/2010)   Immunization History:  Influenza Immunization History:    Influenza:  Historical (04/20/2010)  Prevention & Chronic Care Immunizations    Influenza vaccine: Historical  (04/20/2010)    Tetanus booster: Not documented    Pneumococcal vaccine: Not documented  Other Screening   Smoking status: quit  (05/02/2010)  Lipids   Total Cholesterol: 241  (09/03/2009)   Lipid panel action/deferral: Lipid Panel ordered   LDL: 145  (09/03/2009)   LDL Direct: Not documented   HDL: 40  (09/03/2009)   Triglycerides: 281  (09/03/2009)    Vital Signs:  Patient profile:   45 year old male Height:      76 inches Weight:      244 pounds BMI:     29.81 O2 Sat:      99 % Temp:     97.1 degrees F Pulse rate:   83 / minute BP sitting:   114 / 75  (right arm) Cuff size:   regular  Vitals Entered By: Angelina Ok RN (May 23, 2010 9:24 AM)  O2 Flow:  Room air

## 2010-08-09 NOTE — Assessment & Plan Note (Signed)
Summary: 261/cfb  Anticoagulant Therapy Managed by: [name]. [name] [name]  PharmD CACP PCP: [name]  DO [facility] Attending: Rogelia Boga MD, Lanora Manis Indication 1: Deep vein thrombus Indication 2: Aftercare long term use Anticoagulants V58.61,V58.83 Start date: 03/21/2006 Duration: 1 year  Patient Assessment Reviewed by: [name] PharmD  August 02, 2009 Medication review: verified warfarin dosage & schedule,verified previous prescription medications, verified doses & any changes, verified new medications, reviewed OTC medications, reviewed OTC health products-vitamins supplements etc Complications: none Dietary changes: none   Health status changes: none   Lifestyle changes: none   Recent/future hospitalizations: none   Recent/future procedures: none   Recent/future dental: none Patient Assessment Part 2:  Have you MISSED ANY DOSES or CHANGED TABLETS?  YES. States he missed Saturday's dose of last week. Suspect he may have missed more doses as well--as he indicates today that he is going to get his Rx refilled.  Have you had any BRUISING or BLEEDING ( nose or gum bleeds,blood in urine or stool)?  No reported bruising or bleeding in nose, gums, urine, stool.  Have you STARTED or STOPPED any MEDICATIONS, including OTC meds,herbals or supplements?  No other medications or herbal supplements were started or stopped.  Have you CHANGED your DIET, especially green vegetables,or ALCOHOL intake?  No changes in diet or alcohol intake.  Have you had any ILLNESSES or HOSPITALIZATIONS?  No reported illnesses or hospitalizations  Have you had any signs of CLOTTING?(chest discomfort,dizziness,shortness of breath,arms tingling,slurred speech,swelling or redness in leg)    No chest discomfort, dizziness, shortness of breath, tingling in arm, slurred speech, swelling, or redness in leg.     Treatment  Target INR: 2.0-3.0 INR: 1.4  Date: 08/02/2009 Regimen In:  92.5mg /week INR reflects  regimen in: 1.4  New  Tablet strength: : 5mg  Regimen Out:     Sunday: 2 & 1/2 Tablet     Monday: 3 Tablet     Tuesday: 2 & 1/2 Tablet     Wednesday: 3 Tablet     Thursday: 2 & 1/2 Tablet      Friday: 3 Tablet     Saturday: 2 & 1/2 Tablet Total Weekly: 95.0mg /week mg  Next INR Due: 08/23/2009 Adjusted by: [name]. Alexandria Lodge III PharmD CACP   Return to anticoagulation clinic:  08/23/2009 Time of next visit: 1130    Allergies: No Known Drug Allergies

## 2010-08-09 NOTE — Miscellaneous (Signed)
Summary: Hospital admission  INTERNAL MEDICINE ADMISSION HISTORY AND PHYSICAL ***Place in progress notes section of chart***  Attending: Dr. Phillips [name] 1st contact: Dr. Narda [name] [phone] 2nd contact: Dr. Arvilla [name] [phone]  Weekends, holidays and after 5pm weekdays: 1st contact: [phone] 2nd contact: [phone]  PCP: Dr. Odis [name]  CC: Cough and wheezing  HPI:[name] is a 45 y/o M with PMH outlined below who presented to the [facility] ED early on the morning of 04/19/10 with c/o worsening cough and increased wheezing for several months. He reports several episodes of daily coughing spells productive of greenish to white sputum, increasing sob, wheezing, occasional shaking chills, chest pain and lightheadedness with coughing. He denies fevers, n/v, abd pain or urinary symptoms. He also reports a several month history of watery diarrhea, not associated with n/v, abd pain, melena or hematochezia and has not been worked up for this.  Apparently he had initially presented to [facility] last night, was given scripts for Prednisone and Z-Pack, which he couldn't afford to fill. However, was advised to be transfered to the inpatient service today due to worsening of symptoms and his refusal to go back to the ED. He reports using and Albuterol MDI q2 hours without relief, however there is no documentation of this medication in his outpt record. He states he carries an Asthma diagnosis and there are no PFTs on file. He said this diagnosis was made about one year ago in the ED and he has been getting prescriptiosn for Albuterol and Flovent in the ED for the past one year. He states his symptoms improve with short courses of steroids and cough syrup, +/- antibiotics but always recur after completion of his steroid taper.  He reports using Flovent in the past, but has been out of this for months 2/2 inability to afford rx; this medication is also not present in his outpt record.  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 Alexandria [name] - chronic leg pain - last INR 2.1 in 02/2010 Depression  - hx SI Hx asthma: no PFTs available in EMR or E-chart Hx COPD/bronchitis: states he was diagnosed about 2 years ago in [place]   MEDICATIONS: WARFARIN SODIUM 5 MG TABS (WARFARIN SODIUM) Take as directed. * COMPRESSION STOCKINGS. . KNEE LENGTH    SOCIAL HISTORY: Occupation: He is a full time Consulting civil engineer at [employer], [place] where he is learning substance abuse counseling. He has been enrolled since this summer. He is currently separated from his wife but lives with his girlfriend. Uninsured. Smoker: 1/2ppd x 8yrs. Still smokes occassionally but states he is trying to quit. Hx PSA: cocaine, crack. Quit in 2007.  Hx EtOH abuse: started drinking heavily at 16, beer, wine, hard liquor, quit in 2007.    FAMILY HISTORY: No family hx of clotting disorder, CAD, DM or stroke. Multiple members with HTN and hyperlipidemia.   ROS: as per HPI, all other systems reviewed and negative   VITALS: T: 98.2  P: 80  BP: 115/80  R: 16  O2SAT: 92-95%  ON: RA  PHYSICAL EXAM: General:  alert, well-developed, NAD, breathing fine on room air, cooperative, A&Ox3 Head:  normocephalic and atraumatic.   Eyes:  PERRLA, EOMI, vision grossly intact, conjuctive and sclerae within normal limits.   Mouth:  MMM, no erythema, no exudates, or lesions.   Neck:  supple, full ROM, trachea midline, no palp masses, no JVD, no carotid bruits.   Lungs:  diffuse expiratory wheezes and  rhonci  Heart: RRR, no M/R/G Abdomen:  soft, NT, ND, BS present and normoactive, no palpable masses  Msk:  no joint swelling, warmth, or erythema  Neurologic:  CN II-XII intact,+5 strength globally, sensation grossly intact, gait normal.   Skin:  turgor normal and no rashes.   Psych: memory intact for recent and remote, normally interactive, good eye contact, affect as expected  LABS: Pending  IMAGING: CXR: Findings: The heart  size is normal.  Mild perihilar bronchitic changes stable.  No focal airspace disease is evident.  The visualized soft tissues and bony thorax are unremarkable. IMPRESSION: 1.  Stable chronic perihilar bronchitic change. 2.  No acute cardiopulmonary disease.   ASSESSMENT AND PLAN:  (1) Cough and increased wheezing - likely secondary to asthma flare vs mild COPD exercabation. He has not been well treated due to his inability to afford medications. CXR without infiltrates and is not concerning for a pneumonia at this point and he does not appear to be hypoxemic.  - will admit to regular floor  - start him on bronchodilators for symptomatic relief and steroid taper - Albuterol 2.5mg  nebs, Atrovent 553mcg/2.5ml nebs and by mouth Prednisone taper for mild COPD exercabation. - empiric treatment with Doxycycline by mouth for 5 days for CAP in a patient with mild COPD exercabation. - Tussionex 5ml by mouth for cough. - Mucinex for symptomatic relief of congestion. - CCM/Social work for financial counseling and medication assistance. SW for smoking cessation counseling. He declined nicotine patch. - Close outpatient followup with referral to pulmonology for PFTs. - f/u am labs.  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

## 2010-08-09 NOTE — Discharge Summary (Signed)
Summary: Hospital Discharge Update    Hospital Discharge Update:  Date of Admission: 04/19/2010 Date of Discharge: 04/23/2010  Brief Summary:  Pt admitted for asthma exercabation/acute bronchitis. Lung exam significant for diffuse wheezing for most of his hospitalization. He received IV Solumedrol 125mg  x1 dose, Prednisone taper, bronchodilators and nebs. He was afebrile throughout and CXR negative for infiltrates. However, he completed a 5 day course of empiric abx rx with Doxycycline.  He is unable to fill his scripts due to financial difficulty and so was given 2 boxes of Ventolin and 1 box of Advair in the meantime.  Labs needed at follow-up: PT/INR  Other labs needed at follow-up: PFTs  Other follow-up issues:  He needs PFTs. He has a diagnosis of Asthma but no PFTs on file. Pt needs NEB machine. CCM helping arrange payment plan with [employer], pls see how this is going. He also has financial difficulty and may need sample medications from the clinic. Pls ensure that he filled his prescriptions, most of his meds are on the $4 [facility] list.   Problem list changes:  Added new problem of Hospitalized for  ASTHMA UNSPECIFIED WITH EXACERBATION [phone]) - Signed Added new problem of Hospitalized for  BRONCHITIS (ICD-490) - Signed  Medication list changes:  Changed medication from WARFARIN SODIUM 5 MG TABS (WARFARIN SODIUM) Take as directed. to WARFARIN SODIUM 5 MG TABS (WARFARIN SODIUM) Take 10mg  tonight (10/15). Then take 12.5mg  on M/W/F/Sun and 15mg  on T/Th/Sat. - Signed Added new medication of PREDNISONE 20 MG TABS (PREDNISONE) take 3 tablets (60mg ) by mouth daily for 6 days - Signed Added new medication of PEPCID AC MAXIMUM STRENGTH 20 MG TABS (FAMOTIDINE) take 2 tablets by mouth two times a day - Signed Added new medication of ALBUTEROL SULFATE (2.5 MG/3ML) 0.083% NEBU (ALBUTEROL SULFATE) inhale three times a day and every 3 hours as needed for wheezing or shortness of  breath - Signed Added new medication of VENTOLIN HFA 108 (90 BASE) MCG/ACT AERS (ALBUTEROL SULFATE) inhale 2 puffs every 4 to 6 hours as needed - Signed Added new medication of DOXYCYCLINE HYCLATE 100 MG CAPS (DOXYCYCLINE HYCLATE) take one cap by mouth two times a day for 2 days - Signed Added new medication of ADVAIR DISKUS 250-50 MCG/DOSE AEPB (FLUTICASONE-SALMETEROL) 1 puff inhaled two times a day for asthma - Signed Rx of PREDNISONE 20 MG TABS (PREDNISONE) take 3 tablets (60mg ) by mouth daily for 6 days;  #18 x 0;  Signed;  Entered by: Jaci Lazier MD;  Authorized by: Jaci Lazier MD;  Method used: Print then Give to Patient Rx of PEPCID AC MAXIMUM STRENGTH 20 MG TABS (FAMOTIDINE) take 2 tablets by mouth two times a day;  #60 x 3;  Signed;  Entered by: Jaci Lazier MD;  Authorized by: Jaci Lazier MD;  Method used: Print then Give to Patient Rx of ALBUTEROL SULFATE (2.5 MG/3ML) 0.083% NEBU (ALBUTEROL SULFATE) inhale three times a day and every 3 hours as needed for wheezing or shortness of breath;  #3 x 3;  Signed;  Entered by: Jaci Lazier MD;  Authorized by: Jaci Lazier MD;  Method used: Print then Give to Patient Rx of VENTOLIN HFA 108 (90 BASE) MCG/ACT AERS (ALBUTEROL SULFATE) inhale 2 puffs every 4 to 6 hours as needed;  #3 x 3;  Signed;  Entered by: Jaci Lazier MD;  Authorized by: Jaci Lazier MD;  Method used: Print then Give to Patient Rx of DOXYCYCLINE HYCLATE 100 MG CAPS (DOXYCYCLINE HYCLATE) take one  cap by mouth two times a day for 2 days;  #4 x 0;  Signed;  Entered by: Jaci Lazier MD;  Authorized by: Jaci Lazier MD;  Method used: Print then Give to Patient Rx of ADVAIR DISKUS 250-50 MCG/DOSE AEPB (FLUTICASONE-SALMETEROL) 1 puff inhaled two times a day for asthma;  #3 x 3;  Signed;  Entered by: Jaci Lazier MD;  Authorized by: Jaci Lazier MD;  Method used: Print then Give to Patient  The medication, problem, and allergy lists have been updated.  Please see the dictated discharge  summary for details.  Discharge medications:  WARFARIN SODIUM 5 MG TABS (WARFARIN SODIUM) Take 10mg  tonight (10/15). Then take 12.5mg  on M/W/F/Sun and 15mg  on T/Th/Sat. * COMPRESSION STOCKINGS. . KNEE LENGTH  PREDNISONE 20 MG TABS (PREDNISONE) take 3 tablets (60mg ) by mouth daily for 6 days PEPCID AC MAXIMUM STRENGTH 20 MG TABS (FAMOTIDINE) take 2 tablets by mouth two times a day ALBUTEROL SULFATE (2.5 MG/3ML) 0.083% NEBU (ALBUTEROL SULFATE) inhale three times a day and every 3 hours as needed for wheezing or shortness of breath VENTOLIN HFA 108 (90 BASE) MCG/ACT AERS (ALBUTEROL SULFATE) inhale 2 puffs every 4 to 6 hours as needed DOXYCYCLINE HYCLATE 100 MG CAPS (DOXYCYCLINE HYCLATE) take one cap by mouth two times a day for 2 days ADVAIR DISKUS 250-50 MCG/DOSE AEPB (FLUTICASONE-SALMETEROL) 1 puff inhaled two times a day for asthma  Other patient instructions:  Pls take all your medications as prescribed and fill your prescriptions as soon as you leave the hospital. Pls followup with Dr. Cena Benton on the 24th.  Call the clinic or go to your nearest ER if you are having worsening shortness of breath, chest pain, fever to 101 or higher.  Note: Hospital Discharge Medications & Other Instructions handout was printed, one copy for patient and a second copy to be placed in hospital chart.   Appended Document: Hospital Discharge Update Pt was not given scripts for Coumadin, stated he had enough pills at home and has a refill script.

## 2010-08-09 NOTE — Assessment & Plan Note (Signed)
Summary: 261/cfb  Anticoagulant Therapy Managed by: [name]. Janie Morning  PharmD CACP PCP: [name]  DO [facility] Attending: Darl Pikes, Beth Indication 1: Deep vein thrombus Indication 2: Aftercare long term use Anticoagulants V58.61,V58.83 Start date: 03/21/2006 Duration: 1 year  Patient Assessment Reviewed by: [name] PharmD  Nov 08, 2009 Medication review: verified warfarin dosage & schedule,verified previous prescription medications, verified doses & any changes, verified new medications, reviewed OTC medications, reviewed OTC health products-vitamins supplements etc Complications: none Dietary changes: none   Health status changes: none   Lifestyle changes: none   Recent/future hospitalizations: none   Recent/future procedures: none   Recent/future dental: none Patient Assessment Part 2:  Have you MISSED ANY DOSES or CHANGED TABLETS?  No missed Warfarin doses or changed tablets.  Have you had any BRUISING or BLEEDING ( nose or gum bleeds,blood in urine or stool)?  No reported bruising or bleeding in nose, gums, urine, stool.  Have you STARTED or STOPPED any MEDICATIONS, including OTC meds,herbals or supplements?  YES. Seen in ED on approximately 23-Apr-11 with suspicion for CAP for which he was commenced upon a 7 day course of doxycyline 100mg  by mouth two times a day and oral prednisone. He is OFF both of these now.  Have you CHANGED your DIET, especially green vegetables,or ALCOHOL intake?  No changes in diet or alcohol intake.  Have you had any ILLNESSES or HOSPITALIZATIONS?  YES. Seen in ED 23-Apr-11.  Have you had any signs of CLOTTING?(chest discomfort,dizziness,shortness of breath,arms tingling,slurred speech,swelling or redness in leg)    No chest discomfort, dizziness, shortness of breath, tingling in arm, slurred speech, swelling, or redness in leg.     Treatment  Target INR: 2.0-3.0 INR: 2.6  Date: 11/08/2009 Regimen In:  90.0mg /week INR reflects  regimen in: 2.6  New  Tablet strength: : 5mg  Regimen Out:     Sunday: 2 & 1/2 Tablet     Monday: 2 & 1/2 Tablet     Tuesday: 2 & 1/2 Tablet     Wednesday: 3 Tablet     Thursday: 2 & 1/2 Tablet      Friday: 2 & 1/2 Tablet     Saturday: 2 & 1/2 Tablet Total Weekly: 90.0mg /week mg  Next INR Due: 11/29/2009 Adjusted by: [name]. Alexandria Lodge III PharmD CACP   Return to anticoagulation clinic:  11/29/2009 Time of next visit: 1445    Allergies: No Known Drug Allergies

## 2010-08-09 NOTE — Assessment & Plan Note (Signed)
Summary: COU/CH  Anticoagulant Therapy Managed by: [name]. Janie Morning  PharmD CACP PCP: Whitney Post MD [facility] Attending: Lowella Bandy MD Indication 1: Deep vein thrombus Indication 2: Aftercare long term use Anticoagulants V58.61,V58.83 Start date: 03/21/2006 Duration: 1 year  Patient Assessment Reviewed by: [name] PharmD  May 23, 2010 Medication review: verified warfarin dosage & schedule,verified previous prescription medications, verified doses & any changes, verified new medications, reviewed OTC medications, reviewed OTC health products-vitamins supplements etc Complications: none Dietary changes: none   Health status changes: none   Lifestyle changes: none   Recent/future hospitalizations: none   Recent/future procedures: none   Recent/future dental: none Patient Assessment Part 2:  Have you MISSED ANY DOSES or CHANGED TABLETS?  No missed Warfarin doses or changed tablets.  Have you had any BRUISING or BLEEDING ( nose or gum bleeds,blood in urine or stool)?  No reported bruising or bleeding in nose, gums, urine, stool.  Have you STARTED or STOPPED any MEDICATIONS, including OTC meds,herbals or supplements?  No other medications or herbal supplements were started or stopped.  Have you CHANGED your DIET, especially green vegetables,or ALCOHOL intake?  No changes in diet or alcohol intake.  Have you had any ILLNESSES or HOSPITALIZATIONS?  No reported illnesses or hospitalizations  Have you had any signs of CLOTTING?(chest discomfort,dizziness,shortness of breath,arms tingling,slurred speech,swelling or redness in leg)    No chest discomfort, dizziness, shortness of breath, tingling in arm, slurred speech, swelling, or redness in leg.     Treatment  Target INR: 2.0-3.0 INR: 2.1  Date: 05/23/2010 Regimen In:  92.5mg /week INR reflects regimen in: 2.1  New  Tablet strength: : 5mg  Regimen Out:     Sunday: 3 Tablet     Monday: 2 & 1/2 Tablet     Tuesday: 3  Tablet     Wednesday: 2 & 1/2 Tablet     Thursday: 3 Tablet      Friday: 2 & 1/2 Tablet     Saturday: 3 Tablet Total Weekly: 97.5mg /week mg  Next INR Due: 06/13/2010 Adjusted by: [name]. Alexandria Lodge III PharmD CACP   Return to anticoagulation clinic:  06/13/2010 Time of next visit: 0945    Allergies: No Known Drug Allergies

## 2010-08-09 NOTE — Assessment & Plan Note (Signed)
Summary: hfu-per dr isamah/cfb   Vital Signs:  Patient profile:   45 year old male Height:      76 inches (193.04 cm) Weight:      244.0 pounds (107.18 kg) BMI:     28.81 Temp:     96.6 degrees F (35.89 degrees C) oral Pulse rate:   62 / minute BP sitting:   120 / 84  (left arm) Cuff size:   regular  Vitals Entered By: Theotis Barrio NT II (May 02, 2010 9:26 AM) CC: REFLUX -BURNING-SHARP PAIN STARTED THIS MORNING /  HOSPITAL FOLLOW UP APPT Pain Assessment Patient in pain? yes     Location: CHEST/THROAT Intensity:          6 Type: SHARP/BURNF Onset of pain  REFLUS - THIS MORNING Nutritional Status BMI of 25 - 29 = overweight  Have you ever been in a relationship where you felt threatened, hurt or afraid?No   Does patient need assistance? Functional Status Self care Ambulation Normal   Primary Care Provider:  [name]  DO  CC:  REFLUX -BURNING-SHARP PAIN STARTED THIS MORNING /  HOSPITAL FOLLOW UP APPT.  History of Present Illness: 45 yr old man with pmhx as described below comes to clinic for hospital follow up. Patient reports that asthma is well controlled. He received his nebulizer machine. Has also his medications and is awaiting for the Advair to get to him.  Patient is taking coumadin as prescribed. He is supposed to see Dr. Alexandria Lodge today.  Patient describes severe reflux today. He would like to change the pepcid.  Depression History:      The patient denies a depressed mood most of the day and a diminished interest in his usual daily activities.         Preventive Screening-Counseling & Management  Alcohol-Tobacco     Alcohol drinks/day: 0     Smoking Status: quit     Smoking Cessation Counseling: yes     Packs/Day: 1/2     Year Started: 1990     Year Quit: 2011  / OCTOBER  Caffeine-Diet-Exercise     Does Patient Exercise: yes     Type of exercise: WALKING  Problems Prior to Update: 1)  H/F Bronchitis  (ICD-490) 2)  H/F Asthma Unspecified  With Exacerbation  (ICD-493.92) 3)  Leg Pain, Right  (ICD-729.5) 4)  Abuse, Other/mixed/unspecified Drug, Unspc  (ICD-305.90) 5)  Depression  (ICD-311) 6)  Dvt  (ICD-453.40) 7)  Anticoagulation Therapy  (ICD-V58.61)  Medications Prior to Update: 1)  Warfarin Sodium 5 Mg Tabs (Warfarin Sodium) .... Take 10mg  Tonight (10/15). Then Take 12.5mg  On M/w/f/sun and 15mg  On T/th/sat. 2)  Compression Stockings. . Knee Length 3)  Pepcid Ac Maximum Strength 20 Mg Tabs (Famotidine) .... Take 2 Tablets By Mouth Two Times A Day 4)  Albuterol Sulfate (2.5 Mg/1ml) 0.083% Nebu (Albuterol Sulfate) .... Inhale Three Times A Day and Every 3 Hours As Needed For Wheezing or Shortness of Breath 5)  Ventolin Hfa 108 (90 Base) Mcg/act Aers (Albuterol Sulfate) .... Inhale 2 Puffs Every 4 To 6 Hours As Needed 6)  Doxycycline Hyclate 100 Mg Caps (Doxycycline Hyclate) .... Take One Cap By Mouth Two Times A Day For 2 Days 7)  Advair Diskus 250-50 Mcg/dose Aepb (Fluticasone-Salmeterol) .Marland Kitchen.. 1 Puff Inhaled Two Times A Day For Asthma  Current Medications (verified): 1)  Warfarin Sodium 5 Mg Tabs (Warfarin Sodium) .... Take 10mg  Tonight (10/15). Then Take 12.5mg  On M/w/f/sun and 15mg  On T/th/sat.  2)  Compression Stockings. . Knee Length 3)  Pepcid Ac Maximum Strength 20 Mg Tabs (Famotidine) .... Take 2 Tablets By Mouth Two Times A Day 4)  Albuterol Sulfate (2.5 Mg/36ml) 0.083% Nebu (Albuterol Sulfate) .... Inhale Three Times A Day and Every 3 Hours As Needed For Wheezing or Shortness of Breath 5)  Ventolin Hfa 108 (90 Base) Mcg/act Aers (Albuterol Sulfate) .... Inhale 2 Puffs Every 4 To 6 Hours As Needed 6)  Advair Diskus 250-50 Mcg/dose Aepb (Fluticasone-Salmeterol) .Marland Kitchen.. 1 Puff Inhaled Two Times A Day For Asthma  Allergies: No Known Drug Allergies  Past History:  Past Medical History: Last updated: 07/09/2007 Anticoagulation therapy DVT x 2 Hx of depression  Family History: Last updated: 01/12/2009 no  family members with clots. no one with CAD, DM or stroke. multiple members with HTN.  Social History: Last updated: 09/03/2009 Occupation: detailing cars separated Regular exercise-no (leg pain prevents)  Risk Factors: Alcohol Use: 0 (05/02/2010) Exercise: yes (05/02/2010)  Risk Factors: Smoking Status: quit (05/02/2010) Packs/Day: 1/2 (05/02/2010)  Family History: Reviewed history from 01/12/2009 and no changes required. no family members with clots. no one with CAD, DM or stroke. multiple members with HTN.  Social History: Reviewed history from 09/03/2009 and no changes required. Occupation: detailing cars separated Regular exercise-no (leg pain prevents) Smoking Status:  quit Does Patient Exercise:  yes  Review of Systems  The patient denies fever, chest pain, dyspnea on exertion, hemoptysis, abdominal pain, melena, hematochezia, hematuria, muscle weakness, and difficulty walking.    Physical Exam  General:  NAD Mouth:  MMM Neck:  supple.   Lungs:  normal respiratory effort, no intercostal retractions, no accessory muscle use, and normal breath sounds.   Heart:  normal rate, regular rhythm, and no murmur.   Abdomen:  soft, non-tender, and normal bowel sounds.   Msk:  normal ROM.   Extremities:  no edema Neurologic:  Nonfocal    Impression & Recommendations:  Problem # 1:  ASTHMA (ICD-493.90) Stable. No wheezes on exam.  Instructed to continue current regimen. Will order PFTs for formal diagnosis of Asthma.   His updated medication list for this problem includes:    Albuterol Sulfate (2.5 Mg/67ml) 0.083% Nebu (Albuterol sulfate) ..... Inhale three times a day and every 3 hours as needed for wheezing or shortness of breath    Ventolin Hfa 108 (90 Base) Mcg/act Aers (Albuterol sulfate) ..... Inhale 2 puffs every 4 to 6 hours as needed    Advair Diskus 250-50 Mcg/dose Aepb (Fluticasone-salmeterol) .Marland Kitchen... 1 puff inhaled two times a day for asthma  Problem # 2:   DVT (ICD-453.40) On coumadin, reports to be compliant. Patient has appointment scheduled with Dr. Alexandria Lodge today.   Problem # 3:  GERD (ICD-530.81) Not responding well to Pepcid. Will start patient on omeprazole. Will leave pepcid on medication list just in case patient can not afford the ppi.   His updated medication list for this problem includes:    Pepcid Ac Maximum Strength 20 Mg Tabs (Famotidine) .Marland Kitchen... Take 2 tablets by mouth two times a day    Prilosec Otc 20 Mg Tbec (Omeprazole magnesium) .Marland Kitchen... Take 1 tablet by mouth two times a day  Complete Medication List: 1)  Warfarin Sodium 5 Mg Tabs (Warfarin sodium) .... Take 10mg  tonight (10/15). then take 12.5mg  on m/w/f/sun and 15mg  on t/th/sat. 2)  Compression Stockings. . Knee Length  3)  Pepcid Ac Maximum Strength 20 Mg Tabs (Famotidine) .... Take 2 tablets by mouth two times a  day 4)  Albuterol Sulfate (2.5 Mg/83ml) 0.083% Nebu (Albuterol sulfate) .... Inhale three times a day and every 3 hours as needed for wheezing or shortness of breath 5)  Ventolin Hfa 108 (90 Base) Mcg/act Aers (Albuterol sulfate) .... Inhale 2 puffs every 4 to 6 hours as needed 6)  Advair Diskus 250-50 Mcg/dose Aepb (Fluticasone-salmeterol) .Marland Kitchen.. 1 puff inhaled two times a day for asthma 7)  Prilosec Otc 20 Mg Tbec (Omeprazole magnesium) .... Take 1 tablet by mouth two times a day  Other Orders: PFT Baseline-Pre/Post Bronchodiolator (PFT Baseline-Pre/Pos)  Patient Instructions: 1)  Please schedule a follow-up appointment in 2 months. 2)  Take all medication as directed. Prescriptions: PRILOSEC OTC 20 MG TBEC (OMEPRAZOLE MAGNESIUM) Take 1 tablet by mouth two times a day  #60 x 3   Entered and Authorized by:   Laren Everts MD   Signed by:   Laren Everts MD on 05/02/2010   Method used:   Print then Give to Patient   RxID:   [id number]    Orders Added: 1)  PFT Baseline-Pre/Post Bronchodiolator [PFT Baseline-Pre/Pos] 2)  Est.  Patient Level III [17616]    Prevention & Chronic Care Immunizations   Influenza vaccine: Fluvax 3+  (09/03/2009)    Tetanus booster: Not documented    Pneumococcal vaccine: Not documented  Other Screening   Smoking status: quit  (05/02/2010)  Lipids   Total Cholesterol: 241  (09/03/2009)   Lipid panel action/deferral: Lipid Panel ordered   LDL: 145  (09/03/2009)   LDL Direct: Not documented   HDL: 40  (09/03/2009)   Triglycerides: 281  (09/03/2009)

## 2010-08-09 NOTE — Assessment & Plan Note (Addendum)
Summary: 261/cfb  Anticoagulant Therapy Managed by: [name]. [name] [name]  PharmD CACP PCP: [name]  DO [facility] Attending: [name] MD, [name] Indication 1: Deep vein thrombus Indication 2: Aftercare long term use Anticoagulants V58.61,V58.83 Start date: 03/21/2006 Duration: 1 year  Patient Assessment Reviewed by: [name] PharmD  May 02, 2010 Medication review: verified warfarin dosage & schedule,verified previous prescription medications, verified doses & any changes, verified new medications, reviewed OTC medications, reviewed OTC health products-vitamins supplements etc Complications: none Dietary changes: none   Health status changes: none   Lifestyle changes: none   Recent/future hospitalizations: none   Recent/future procedures: none   Recent/future dental: none Patient Assessment Part 2:  Have you MISSED ANY DOSES or CHANGED TABLETS?  No missed Warfarin doses or changed tablets.  Have you had any BRUISING or BLEEDING ( nose or gum bleeds,blood in urine or stool)?  No reported bruising or bleeding in nose, gums, urine, stool.  Have you STARTED or STOPPED any MEDICATIONS, including OTC meds,herbals or supplements?  No other medications or herbal supplements were started or stopped.  Have you CHANGED your DIET, especially green vegetables,or ALCOHOL intake?  No changes in diet or alcohol intake.  Have you had any ILLNESSES or HOSPITALIZATIONS?  No reported illnesses or hospitalizations  Have you had any signs of CLOTTING?(chest discomfort,dizziness,shortness of breath,arms tingling,slurred speech,swelling or redness in leg)    No chest discomfort, dizziness, shortness of breath, tingling in arm, slurred speech, swelling, or redness in leg.     Treatment  Target INR: 2.0-3.0 INR: 1.8  Date: 05/02/2010 Regimen In:  95.0mg /week INR reflects regimen in: 1.8  New  Tablet strength: : 5mg  Regimen Out:     Sunday: 2 & 1/2 Tablet     Monday: 2 & 1/2 Tablet  Tuesday: 2 & 1/2 Tablet     Wednesday: 2 & 1/2 Tablet     Thursday: 2 & 1/2 Tablet      Friday: 2 & 1/2 Tablet     Saturday: 2 & 1/2 Tablet Total Weekly: 87.5mg /week mg  Next INR Due: 05/09/2010 Adjusted by: [name]. Alexandria Lodge III PharmD CACP   Return to anticoagulation clinic:  05/09/2010 Time of next visit: 0930   Comments: Recently hospitalized/discharged with acute bronchitis/exacerbation of reactive airway disease. Completed course of doxcycline and prednisone. Was given 10mg  by mouth warfarin once daily he states. He had been on much higher regimen prior to hospitalization. Will increase back to slightly below previous dosing requirements and recheck INR in 1 week.  Allergies: No Known Drug Allergies  Appended Document: 261/cfb    Clinical Lists Changes        Problem # 13:  DVT (ICD-453.40) Agree with Dr. Alexandria Lodge above.  I am not sure based on breif review of the records if pt truly needs indefinite anticoagulation vs reassessment and stopping at this point.  Please review Dr. [name] note from 01/14/2007 that states he may only need defined time period vs Dr. Gaspar Cola note who felt he may benefit from long term anticoagulation.    Will need to revisit at future visit in more detail and determine risk/benefit of continued treatment.  Complete Medication List: 1)  Warfarin Sodium 5 Mg Tabs (Warfarin sodium) .... Take 10mg  tonight (10/15). then take 12.5mg  on m/w/f/sun and 15mg  on t/th/sat. 2)  Compression Stockings. . Knee Length  3)  Pepcid Ac Maximum Strength 20 Mg Tabs (Famotidine) .... Take 2 tablets by mouth two times a day 4)  Albuterol Sulfate (2.5  Mg/35ml) 0.083% Nebu (Albuterol sulfate) .... Inhale three times a day and every 3 hours as needed for wheezing or shortness of breath 5)  Ventolin Hfa 108 (90 Base) Mcg/act Aers (Albuterol sulfate) .... Inhale 2 puffs every 4 to 6 hours as needed 6)  Advair Diskus 250-50 Mcg/dose Aepb (Fluticasone-salmeterol) .Marland Kitchen.. 1  puff inhaled two times a day for asthma 7)  Prilosec Otc 20 Mg Tbec (Omeprazole magnesium) .... Take 1 tablet by mouth two times a day

## 2010-08-09 NOTE — Assessment & Plan Note (Signed)
Summary: 930 AM COU/CH  Anticoagulant Therapy Managed by: [name]. Janie Morning  PharmD CACP PCP: [name]  DO [facility] Attending: Rogelia Boga MD, Lanora Manis Indication 1: Deep vein thrombus Indication 2: Aftercare long term use Anticoagulants V58.61,V58.83 Start date: 03/21/2006 Duration: 1 year  Patient Assessment Reviewed by: [name] PharmD  May 09, 2010 Medication review: verified warfarin dosage & schedule,verified previous prescription medications, verified doses & any changes, verified new medications, reviewed OTC medications, reviewed OTC health products-vitamins supplements etc Complications: none Dietary changes: none   Health status changes: none   Lifestyle changes: none   Recent/future hospitalizations: none   Recent/future procedures: none   Recent/future dental: none Patient Assessment Part 2:  Have you MISSED ANY DOSES or CHANGED TABLETS?  No missed Warfarin doses or changed tablets.  Have you had any BRUISING or BLEEDING ( nose or gum bleeds,blood in urine or stool)?  No reported bruising or bleeding in nose, gums, urine, stool.  Have you STARTED or STOPPED any MEDICATIONS, including OTC meds,herbals or supplements?  No other medications or herbal supplements were started or stopped.  Have you CHANGED your DIET, especially green vegetables,or ALCOHOL intake?  No changes in diet or alcohol intake.  Have you had any ILLNESSES or HOSPITALIZATIONS?  No reported illnesses or hospitalizations  Have you had any signs of CLOTTING?(chest discomfort,dizziness,shortness of breath,arms tingling,slurred speech,swelling or redness in leg)    No chest discomfort, dizziness, shortness of breath, tingling in arm, slurred speech, swelling, or redness in leg.     Treatment  Target INR: 2.0-3.0 INR: 2.3  Date: 05/09/2010 Regimen In:  87.5mg /week INR reflects regimen in: 2.3  New  Tablet strength: : 5mg  Regimen Out:     Sunday: 2 & 1/2 Tablet     Monday: 3 Tablet  Tuesday: 2 & 1/2 Tablet     Wednesday: 2 & 1/2 Tablet     Thursday: 3 Tablet      Friday: 2 & 1/2 Tablet     Saturday: 2 & 1/2 Tablet Total Weekly: 92.5mg /week mg  Next INR Due: 05/23/2010 Adjusted by: [name]. Alexandria Lodge III PharmD CACP   Return to anticoagulation clinic:  05/23/2010 Time of next visit: 0945    Allergies: No Known Drug Allergies

## 2010-08-09 NOTE — Miscellaneous (Signed)
Summary: ED visit   I got paged from the ED physician assistant Carollee Herter). She reports that [name] [name] presented to [facility] with "Asthma Exacerbation". She reports that they have been treating him with nebulisation and planning to discharge from the ED. She requested an ED follow up appointment on 04/19/2010. I reported that if his breathing does not improve, we are OK to admit him overnight observation. We will make an appointment for 04/19/2010 if possible and inform the patient on his cell phone [phone].

## 2010-08-11 NOTE — Assessment & Plan Note (Addendum)
Summary: COU/CH  Anticoagulant Therapy Managed by: [name]. Janie Morning  PharmD CACP PCP: Whitney Post MD [facility] Attending: Rogelia Boga MD, Lanora Manis Indication 1: Deep vein thrombus Indication 2: Aftercare long term use Anticoagulants V58.61,V58.83 Start date: 03/21/2006 Duration: 1 year  Patient Assessment Reviewed by: [name] PharmD  July 18, 2010 Medication review: verified warfarin dosage & schedule,verified previous prescription medications, verified doses & any changes, verified new medications, reviewed OTC medications, reviewed OTC health products-vitamins supplements etc Complications: none Dietary changes: none   Health status changes: none   Lifestyle changes: none   Recent/future hospitalizations: none   Recent/future procedures: none   Recent/future dental: none Patient Assessment Part 2:  Have you MISSED ANY DOSES or CHANGED TABLETS?  No missed Warfarin doses or changed tablets.  Have you had any BRUISING or BLEEDING ( nose or gum bleeds,blood in urine or stool)?  No reported bruising or bleeding in nose, gums, urine, stool.  Have you STARTED or STOPPED any MEDICATIONS, including OTC meds,herbals or supplements?  No other medications or herbal supplements were started or stopped.  Have you CHANGED your DIET, especially green vegetables,or ALCOHOL intake?  No changes in diet or alcohol intake.  Have you had any ILLNESSES or HOSPITALIZATIONS?  YES. Recent hospitalization for reactive airway exacerbation-treated in hospital with antibiotics and placed on a steroid taper. Additionally, they adjusted his warfarin dose DOWN and he continued to follow these instructions since discharge from hospital.  Have you had any signs of CLOTTING?(chest discomfort,dizziness,shortness of breath,arms tingling,slurred speech,swelling or redness in leg)    No chest discomfort, dizziness, shortness of breath, tingling in arm, slurred speech, swelling, or redness in leg.      Treatment  Target INR: 2.0-3.0 INR: 1.5  Date: 07/18/2010 Regimen In:  105.0mg /week INR reflects regimen in: 1.5  New  Tablet strength: : 5mg  Regimen Out:     Sunday: 3 Tablet     Monday: 3 Tablet     Tuesday: 3 Tablet     Wednesday: 3 Tablet     Thursday: 3 Tablet      Friday: 3 Tablet     Saturday: 3 Tablet Total Weekly: 105.0mg /week mg  Next INR Due: 08/01/2010 Adjusted by: [name]. Alexandria Lodge III PharmD CACP   Return to anticoagulation clinic:  08/01/2010 Time of next visit: 1045    Allergies: No Known Drug Allergies

## 2010-08-11 NOTE — Progress Notes (Signed)
Summary: refill/ hla  Phone Note Refill Request Message from:  Patient on July 12, 2010 2:03 PM  Refills Requested: Medication #1:  WARFARIN SODIUM 5 MG TABS Take 10mg  tonight (10/15). Then take 12.5mg  on M/W/F/Sun and 15mg  on T/Th/Sat.   Dosage confirmed as above?Dosage Confirmed Initial call taken by: Marin Roberts RN,  July 12, 2010 2:03 PM  Follow-up for Phone Call        refilled at appt on 07/13/2010 Follow-up by: Whitney Post MD,  July 14, 2010 9:55 PM

## 2010-08-11 NOTE — Assessment & Plan Note (Signed)
Summary: 261/cfb  Anticoagulant Therapy Managed by: [name]. Janie Morning  PharmD CACP PCP: Whitney Post MD [facility] Attending: Darl Pikes, Beth Indication 1: Deep vein thrombus Indication 2: Aftercare long term use Anticoagulants V58.61,V58.83 Start date: 03/21/2006 Duration: 1 year  Patient Assessment Reviewed by: [name] PharmD  June 27, 2010 Medication review: verified warfarin dosage & schedule,verified previous prescription medications, verified doses & any changes, verified new medications, reviewed OTC medications, reviewed OTC health products-vitamins supplements etc Complications: none Dietary changes: none   Health status changes: none   Lifestyle changes: none   Recent/future hospitalizations: none   Recent/future procedures: none   Recent/future dental: none Patient Assessment Part 2:  Have you MISSED ANY DOSES or CHANGED TABLETS?  No missed Warfarin doses or changed tablets.  Have you had any BRUISING or BLEEDING ( nose or gum bleeds,blood in urine or stool)?  No reported bruising or bleeding in nose, gums, urine, stool.  Have you STARTED or STOPPED any MEDICATIONS, including OTC meds,herbals or supplements?  No other medications or herbal supplements were started or stopped.  Have you CHANGED your DIET, especially green vegetables,or ALCOHOL intake?  No changes in diet or alcohol intake.  Have you had any ILLNESSES or HOSPITALIZATIONS?  No reported illnesses or hospitalizations  Have you had any signs of CLOTTING?(chest discomfort,dizziness,shortness of breath,arms tingling,slurred speech,swelling or redness in leg)    No chest discomfort, dizziness, shortness of breath, tingling in arm, slurred speech, swelling, or redness in leg.     Treatment  Target INR: 2.0-3.0 INR: 1.6  Date: 06/27/2010 Regimen In:  97.5mg /week INR reflects regimen in: 1.6  New  Tablet strength: : 5mg  Regimen Out:     Sunday: 3 Tablet     Monday: 3 Tablet     Tuesday: 3  Tablet     Wednesday: 3 Tablet     Thursday: 3 Tablet      Friday: 3 Tablet     Saturday: 3 Tablet Total Weekly: 105.0mg /week mg  Next INR Due: 07/18/2010 Adjusted by: [name]. Alexandria Lodge III PharmD CACP   Return to anticoagulation clinic:  07/18/2010 Time of next visit: 0915    Allergies: No Known Drug Allergies

## 2010-08-11 NOTE — Assessment & Plan Note (Signed)
Summary: HFU/CH   Vital Signs:  Patient profile:   45 year old male Height:      76 inches (193.04 cm) Weight:      258.3 pounds (117.41 kg) BMI:     31.55 O2 Sat:      97 % on Room air Temp:     97.5 degrees F (36.39 degrees C) oral Pulse rate:   94 / minute BP sitting:   116 / 78  (left arm)  Vitals Entered By: Stanton Kidney Ditzler RN (July 13, 2010 1:47 PM)  O2 Flow:  Room air CC: hospital f/u Is Patient Diabetic? No Pain Assessment Patient in pain? no      Nutritional Status BMI of > 30 = obese Nutritional Status Detail appetite good  Have you ever been in a relationship where you felt threatened, hurt or afraid?denies   Does patient need assistance? Functional Status Self care Ambulation Normal Comments HFU - needs refill on Coumadin - out about 2-3 days. Appt with Dr Alexandria Lodge 07/18/10. Has wheezing and clear cough at night.   Primary Care Provider:  Whitney Post MD  CC:  hospital f/u.  History of Present Illness: 45yo M with PMH of asthma, GERD, and DVT recently admitted on 12/27-31 for acute bronchitis with asthma exacerbation. He is still on prednisone taper (currently taking 50mg  daily). He uses his albuterol nebulizer every 4 hours while at home and his albuterol inhaler when out of the house. He is still wheezing with mild cough but he feels improved since discharge. He still smokes but has cut down to about 6 cigarettes/day. He denies fever/chills, nausea/vomiting, chest pain, or other systemic symptoms.   Depression History:      The patient denies a depressed mood most of the day and a diminished interest in his usual daily activities.         Preventive Screening-Counseling & Management  Alcohol-Tobacco     Alcohol drinks/day: 0     Smoking Status: current     Smoking Cessation Counseling: yes     Packs/Day: 6 cigs per day     Year Started: 1990     Year Quit: 2011  / OCTOBER  Caffeine-Diet-Exercise     Does Patient Exercise: yes     Type of exercise:  WALKING  Current Medications (verified): 1)  Warfarin Sodium 5 Mg Tabs (Warfarin Sodium) .... Take 10mg  Tonight (10/15). Then Take 12.5mg  On M/w/f/sun and 15mg  On T/th/sat. 2)  Compression Stockings. . Knee Length 3)  Pepcid Ac Maximum Strength 20 Mg Tabs (Famotidine) .... Take 2 Tablets By Mouth Two Times A Day 4)  Albuterol Sulfate (2.5 Mg/62ml) 0.083% Nebu (Albuterol Sulfate) .... Inhale Three Times A Day and Every 3 Hours As Needed For Wheezing or Shortness of Breath 5)  Ventolin Hfa 108 (90 Base) Mcg/act Aers (Albuterol Sulfate) .... Inhale 2 Puffs Every 4 To 6 Hours As Needed 6)  Advair Diskus 250-50 Mcg/dose Aepb (Fluticasone-Salmeterol) .Marland Kitchen.. 1 Puff Inhaled Two Times A Day For Asthma 7)  Prednisone 10 Mg Tabs (Prednisone) .... Take 6 Tabs By Mouth Daily X 3 Days, 5 Tabs Daily X 3 Days, 4 Tabs Daily X 3 Days, 3 Tabs Daily X 3 Days, 2 Tabs Daily X 3 Days, 1 Tab Daily X 3 Days Stop. 8)  Tessalon Perles 100 Mg Caps (Benzonatate) .... Take 2 Tabs By Mouth Every 6 Hours As Needed For Cough.  Allergies: No Known Drug Allergies  Past History:  Past Medical History: Last updated:  07/09/2007 Anticoagulation therapy DVT x 2 Hx of depression  Family History: Last updated: 01/12/2009 no family members with clots. no one with CAD, DM or stroke. multiple members with HTN.  Social History: Last updated: 07/13/2010 Occupation: detailing cars separated Regular exercise-no (leg pain prevents) Smokes 6 cigarettes/day  Social History: Occupation: detailing cars separated Regular exercise-no (leg pain prevents) Smokes 6 cigarettes/day Smoking Status:  current Packs/Day:  6 cigs per day  Review of Systems      See HPI General:  Denies chills and fever. ENT:  Denies nasal congestion and sore throat. CV:  Denies chest pain or discomfort and lightheadness. Resp:  Complains of cough and wheezing. GI:  Denies abdominal pain. Heme:  Denies bleeding.  Physical Exam  General:   alert and cooperative to examination.   Head:  normocephalic and atraumatic.   Eyes:  vision grossly intact, pupils equal, pupils round, and pupils reactive to light.   Mouth:  pharynx pink and moist.   Lungs:  Fair to good air movement bilaterally with rare wheezes.  Heart:  normal rate, regular rhythm, no murmur, no gallop, and no rub.   Abdomen:  soft and non-tender.   Msk:  no joint tenderness, no joint swelling, and no joint warmth.   Extremities:  No edema.  Neurologic:  alert & oriented X3 and cranial nerves grossly intact.   Skin:  turgor normal.   Psych:  Oriented X3, memory intact for recent and remote, normally interactive, good eye contact, not anxious appearing, and not depressed appearing.     Impression & Recommendations:  Problem # 1:  Hosp for ASTHMA UNSPECIFIED WITH EXACERBATION (ICD-493.92) Continues to improve since discharge. Continue prednisone taper and inhalers.   His updated medication list for this problem includes:    Albuterol Sulfate (2.5 Mg/74ml) 0.083% Nebu (Albuterol sulfate) ..... Inhale three times a day and every 3 hours as needed for wheezing or shortness of breath    Ventolin Hfa 108 (90 Base) Mcg/act Aers (Albuterol sulfate) ..... Inhale 2 puffs every 4 to 6 hours as needed    Advair Diskus 250-50 Mcg/dose Aepb (Fluticasone-salmeterol) .Marland Kitchen... 1 puff inhaled two times a day for asthma    Prednisone 10 Mg Tabs (Prednisone) .Marland Kitchen... Take 6 tabs by mouth daily x 3 days, 5 tabs daily x 3 days, 4 tabs daily x 3 days, 3 tabs daily x 3 days, 2 tabs daily x 3 days, 1 tab daily x 3 days stop.  Problem # 2:  DVT (ICD-453.40) Ran out of warfarin 2 days ago. Will refill. Has appt to follow with Dr Alexandria Lodge on Monday.   Problem # 3:  TOBACCO ABUSE (ICD-305.1) Counseled on the importance of smoking cessation. Patient says that he is cutting down and has successfully decreased from 1/2 pack daily to 6 cigarettes daily.   Complete Medication List: 1)  Warfarin Sodium 5 Mg  Tabs (Warfarin sodium) .... Take 12.5mg  daily. 2)  Compression Stockings. . Knee Length  3)  Pepcid Ac Maximum Strength 20 Mg Tabs (Famotidine) .... Take 2 tablets by mouth two times a day 4)  Albuterol Sulfate (2.5 Mg/34ml) 0.083% Nebu (Albuterol sulfate) .... Inhale three times a day and every 3 hours as needed for wheezing or shortness of breath 5)  Ventolin Hfa 108 (90 Base) Mcg/act Aers (Albuterol sulfate) .... Inhale 2 puffs every 4 to 6 hours as needed 6)  Advair Diskus 250-50 Mcg/dose Aepb (Fluticasone-salmeterol) .Marland Kitchen.. 1 puff inhaled two times a day for asthma 7)  Prednisone  10 Mg Tabs (Prednisone) .... Take 6 tabs by mouth daily x 3 days, 5 tabs daily x 3 days, 4 tabs daily x 3 days, 3 tabs daily x 3 days, 2 tabs daily x 3 days, 1 tab daily x 3 days stop. 8)  Tessalon Perles 100 Mg Caps (Benzonatate) .... Take 2 tabs by mouth every 6 hours as needed for cough.  Patient Instructions: 1)  Please schedule a follow-up appointment in 1 month. 2)  Tobacco is very bad for your health and your loved ones! You Should stop smoking!. 3)  Stop Smoking Tips: Choose a Quit date. Cut down before the Quit date. decide what you will do as a substitute when you feel the urge to smoke(gum,toothpick,exercise). Prescriptions: WARFARIN SODIUM 5 MG TABS (WARFARIN SODIUM) Take 10mg  tonight (10/15). Then take 12.5mg  on M/W/F/Sun and 15mg  on T/Th/Sat.  #90 x 1   Entered and Authorized by:   Whitney Post MD   Signed by:   Whitney Post MD on 07/13/2010   Method used:   Electronically to        [facility] [phone]* (retail)       [address]       [place], [place]  [address]       Ph: [phone]       Fax: [phone]   RxID:   [id number]    Orders Added: 1)  Est. Patient Level IV [27253]     Prevention & Chronic Care Immunizations   Influenza vaccine: Historical  (04/20/2010)    Tetanus booster: Not documented    Pneumococcal vaccine: Not documented  Other  Screening   Smoking status: current  (07/13/2010)   Smoking cessation counseling: yes  (07/13/2010)  Lipids   Total Cholesterol: 241  (09/03/2009)   Lipid panel action/deferral: Lipid Panel ordered   LDL: 145  (09/03/2009)   LDL Direct: Not documented   HDL: 40  (09/03/2009)   Triglycerides: 281  (09/03/2009)      Resource handout printed.
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
## 2010-08-11 NOTE — Progress Notes (Signed)
Summary: Samples  Phone Note Call from Patient   Caller: Patient Call For: Whitney Post MD Summary of Call: Pt called said that he was recently discharged.  Was given Lovenox went to the pharmacy.  Can not afford the Lovenox wants to know if we have samples for him. Angelina Ok RN  June 28, 2010 11:39 AM  Initial call taken by: Angelina Ok RN,  June 28, 2010 11:39 AM  Follow-up for Phone Call        Pt was given samples of the Lovenox by. Juleen Starr. Follow-up by: Angelina Ok RN,  June 28, 2010 12:56 PM

## 2010-08-11 NOTE — Discharge Summary (Signed)
Summary: Hospital Discharge Update    Hospital Discharge Update:  Date of Admission: 07/04/2010 Date of Discharge: 07/09/2010  Brief Summary:  This is a 45 year old male who presented with acute bronchitis with asthma exacerbation.  Pt was placed on a steroid taper and recieved 5 days of doxy while being treated with Q4 bronchodialators. On the day of discharge, pt was in improved condition though he continued to have significant bilateral wheezes.  Pt agreed that he felt comfortable continueing his treatment at home.   Other follow-up issues:  Ensure that pts clinical condition is improving and that his wheezing is stable.   Pt must meet with Jaynee Eagles to get [employer]. Pt has hx of DVT for which he is on coumadin and should follow up with Dr. Alexandria Lodge.  Medication list changes:  Changed medication from WARFARIN SODIUM 5 MG TABS (WARFARIN SODIUM) Take 10mg  tonight (10/15). Then take 12.5mg  on M/W/F/Sun and 15mg  on T/Th/Sat. to WARFARIN SODIUM 5 MG TABS (WARFARIN SODIUM) Take 12.5mg  daily. Added new medication of PREDNISONE 10 MG TABS (PREDNISONE) Take 6 tabs by mouth daily x 3 days, 5 tabs daily x 3 days, 4 tabs daily x 3 days, 3 tabs daily x 3 days, 2 tabs daily x 3 days, 1 tab daily x 3 days stop. - Signed Added new medication of TESSALON PERLES 100 MG CAPS (BENZONATATE) Take 2 tabs by mouth every 6 hours as needed for cough. - Signed Rx of PREDNISONE 10 MG TABS (PREDNISONE) Take 6 tabs by mouth daily x 3 days, 5 tabs daily x 3 days, 4 tabs daily x 3 days, 3 tabs daily x 3 days, 2 tabs daily x 3 days, 1 tab daily x 3 days stop.;  #63 x 0;  Signed;  Entered by: Sinda Du MD;  Authorized by: Sinda Du MD;  Method used: Print then Give to Patient Rx of TESSALON PERLES 100 MG CAPS (BENZONATATE) Take 2 tabs by mouth every 6 hours as needed for cough.;  #42 x 1;  Signed;  Entered by: Sinda Du MD;  Authorized by: Sinda Du MD;  Method used: Print then Give to Patient  The  medication, problem, and allergy lists have been updated.  Please see the dictated discharge summary for details.  Discharge medications:  WARFARIN SODIUM 5 MG TABS (WARFARIN SODIUM) Take 12.5mg  daily. * COMPRESSION STOCKINGS. . KNEE LENGTH  PEPCID AC MAXIMUM STRENGTH 20 MG TABS (FAMOTIDINE) take 2 tablets by mouth two times a day ALBUTEROL SULFATE (2.5 MG/3ML) 0.083% NEBU (ALBUTEROL SULFATE) inhale three times a day and every 3 hours as needed for wheezing or shortness of breath VENTOLIN HFA 108 (90 BASE) MCG/ACT AERS (ALBUTEROL SULFATE) inhale 2 puffs every 4 to 6 hours as needed ADVAIR DISKUS 250-50 MCG/DOSE AEPB (FLUTICASONE-SALMETEROL) 1 puff inhaled two times a day for asthma PREDNISONE 10 MG TABS (PREDNISONE) Take 6 tabs by mouth daily x 3 days, 5 tabs daily x 3 days, 4 tabs daily x 3 days, 3 tabs daily x 3 days, 2 tabs daily x 3 days, 1 tab daily x 3 days stop. TESSALON PERLES 100 MG CAPS (BENZONATATE) Take 2 tabs by mouth every 6 hours as needed for cough.  Other patient instructions:  You wil be called to schedule a hospital follow up appointment at the outpatient clinic at [facility] for next week.  If you are not called by Monday afternoon, please call [phone] to set up an appointment.   You will also need to  set up a time to see Dr. Alexandria Lodge next week for a coumadin check.  Please inform the receptionist of this when you call to schedule your hospital follow up appointment.   Remember that you must meet with Jaynee Eagles to get an orange card.   Please take your medication as prescribed below.  If you have any problem, Please call the clinic.   In case of an emergency  dial 911 or go to the emergency department.   Note: Hospital Discharge Medications & Other Instructions handout was printed, one copy for patient and a second copy to be placed in hospital chart.

## 2010-08-14 ENCOUNTER — Emergency Department (HOSPITAL_COMMUNITY)
Admission: EM | Admit: 2010-08-14 | Discharge: 2010-08-14 | Disposition: A | Discharge status: Home / Self Care | Payer: Self-pay | Attending: Emergency Medicine | Admitting: Emergency Medicine

## 2010-08-14 ENCOUNTER — Emergency Department (HOSPITAL_COMMUNITY): Payer: Self-pay

## 2010-08-14 DIAGNOSIS — F17200 Nicotine dependence, unspecified, uncomplicated: Secondary | ICD-10-CM | POA: Insufficient documentation

## 2010-08-14 DIAGNOSIS — J45901 Unspecified asthma with (acute) exacerbation: Secondary | ICD-10-CM | POA: Insufficient documentation

## 2010-08-14 DIAGNOSIS — R0602 Shortness of breath: Secondary | ICD-10-CM | POA: Insufficient documentation

## 2010-08-14 DIAGNOSIS — R059 Cough, unspecified: Secondary | ICD-10-CM | POA: Insufficient documentation

## 2010-08-14 DIAGNOSIS — R05 Cough: Secondary | ICD-10-CM | POA: Insufficient documentation

## 2010-08-16 ENCOUNTER — Other Ambulatory Visit: Payer: Self-pay | Admitting: *Deleted

## 2010-08-16 MED ORDER — FLUTICASONE-SALMETEROL 250-50 MCG/DOSE IN AEPB: 1.0000 | INHALATION_SPRAY | Freq: Two times a day (BID) | RESPIRATORY_TRACT | Status: DC

## 2010-09-19 LAB — CBC
HCT: 46 % (ref 39.0–52.0)
HCT: 47.8 % (ref 39.0–52.0)
Hemoglobin: 16.5 g/dL (ref 13.0–17.0)
MCHC: 34.5 g/dL (ref 30.0–36.0)
MCV: 93.5 fL (ref 78.0–100.0)
MCV: 94.1 fL (ref 78.0–100.0)
MCV: 93.9 fL (ref 78.0–100.0)
Platelets: 196 10*3/uL (ref 150–400)
Platelets: 207 10*3/uL (ref 150–400)
Platelets: 221 10*3/uL (ref 150–400)
RBC: 4.74 MIL/uL (ref 4.22–5.81)
RBC: 4.92 MIL/uL (ref 4.22–5.81)
RBC: 5.07 MIL/uL (ref 4.22–5.81)
RDW: 13.5 % (ref 11.5–15.5)
RDW: 13.8 % (ref 11.5–15.5)
WBC: 6.3 10*3/uL (ref 4.0–10.5)
WBC: 9.3 10*3/uL (ref 4.0–10.5)
WBC: 9.5 10*3/uL (ref 4.0–10.5)

## 2010-09-19 LAB — BASIC METABOLIC PANEL
BUN: 12 mg/dL (ref 6–23)
BUN: 16 mg/dL (ref 6–23)
CO2: 23 mEq/L (ref 19–32)
Chloride: 104 mEq/L (ref 96–112)
Chloride: 106 mEq/L (ref 96–112)
Creatinine, Ser: 0.9 mg/dL (ref 0.4–1.5)
Creatinine, Ser: 0.99 mg/dL (ref 0.4–1.5)
GFR calc Af Amer: >60 mL/min (ref >60)
GFR calc Af Amer: >60 mL/min (ref >60)
GFR calc non Af Amer: >60 mL/min (ref >60)
GFR calc non Af Amer: >60 mL/min (ref >60)
GFR calc non Af Amer: >60 mL/min (ref >60)
Glucose, Bld: 120 mg/dL — ABNORMAL HIGH (ref 70–99)
Potassium: 3.9 mEq/L (ref 3.5–5.1)
Potassium: 4.2 mEq/L (ref 3.5–5.1)
Sodium: 139 mEq/L (ref 135–145)

## 2010-09-19 LAB — DIFFERENTIAL
Basophils Absolute: 0 10*3/uL (ref 0.0–0.1)
Basophils Relative: 1 % (ref 0–1)
Basophils Relative: 0 % (ref 0–1)
Eosinophils Relative: 0 % (ref 0–5)
Eosinophils Relative: 1 % (ref 0–5)
Lymphocytes Relative: 35 % (ref 12–46)
Lymphocytes Relative: 7 % — ABNORMAL LOW (ref 12–46)
Lymphs Abs: 0.7 10*3/uL (ref 0.7–4.0)
Monocytes Absolute: 0.2 10*3/uL (ref 0.1–1.0)
Monocytes Absolute: 0.6 10*3/uL (ref 0.1–1.0)
Monocytes Absolute: 0.4 10*3/uL (ref 0.1–1.0)
Monocytes Relative: 9 % (ref 3–12)
Monocytes Relative: 3 % (ref 3–12)
Neutro Abs: 3.2 10*3/uL (ref 1.7–7.7)
Neutro Abs: 8.6 10*3/uL — ABNORMAL HIGH (ref 1.7–7.7)
Neutro Abs: 10 10*3/uL — ABNORMAL HIGH (ref 1.7–7.7)
Neutrophils Relative %: 50 % (ref 43–77)

## 2010-09-19 LAB — PROTIME-INR
INR: 2.16 — ABNORMAL HIGH (ref 0.00–1.49)
INR: 2.2 — ABNORMAL HIGH (ref 0.00–1.49)
INR: 2.96 — ABNORMAL HIGH (ref 0.00–1.49)
INR: 3.84 — ABNORMAL HIGH (ref 0.00–1.49)
Prothrombin Time: 24.2 seconds — ABNORMAL HIGH (ref 11.6–15.2)
Prothrombin Time: 28 seconds — ABNORMAL HIGH (ref 11.6–15.2)
Prothrombin Time: 30.9 seconds — ABNORMAL HIGH (ref 11.6–15.2)
Prothrombin Time: 37.7 seconds — ABNORMAL HIGH (ref 11.6–15.2)

## 2010-09-21 LAB — BASIC METABOLIC PANEL
BUN: 17 mg/dL (ref 6–23)
CO2: 23 mEq/L (ref 19–32)
CO2: 26 mEq/L (ref 19–32)
Calcium: 9 mg/dL (ref 8.4–10.5)
Calcium: 9.1 mg/dL (ref 8.4–10.5)
Chloride: 104 mEq/L (ref 96–112)
Creatinine, Ser: 1.04 mg/dL (ref 0.4–1.5)
Creatinine, Ser: 1.26 mg/dL (ref 0.4–1.5)
GFR calc Af Amer: >60 mL/min (ref >60)
GFR calc Af Amer: >60 mL/min (ref >60)
GFR calc non Af Amer: >60 mL/min (ref >60)
Glucose, Bld: 78 mg/dL (ref 70–99)
Potassium: 4 mEq/L (ref 3.5–5.1)
Sodium: 139 mEq/L (ref 135–145)

## 2010-09-21 LAB — CBC
HCT: 45.3 % (ref 39.0–52.0)
HCT: 46 % (ref 39.0–52.0)
Hemoglobin: 16.1 g/dL (ref 13.0–17.0)
MCH: 32.5 pg (ref 26.0–34.0)
MCHC: 34.2 g/dL (ref 30.0–36.0)
MCHC: 35.5 g/dL (ref 30.0–36.0)
MCV: 91.5 fL (ref 78.0–100.0)
MCV: 94.4 fL (ref 78.0–100.0)
RDW: 13.6 % (ref 11.5–15.5)

## 2010-09-21 LAB — DIFFERENTIAL
Basophils Absolute: 0 10*3/uL (ref 0.0–0.1)
Basophils Relative: 1 % (ref 0–1)
Eosinophils Relative: 5 % (ref 0–5)
Monocytes Absolute: 0.8 10*3/uL (ref 0.1–1.0)

## 2010-09-21 LAB — HIV ANTIBODY (ROUTINE TESTING W REFLEX): HIV: NONREACTIVE

## 2010-09-21 LAB — COMPREHENSIVE METABOLIC PANEL
ALT: 29 U/L (ref 0–53)
AST: 23 U/L (ref 0–37)
Albumin: 3.9 g/dL (ref 3.5–5.2)
Alkaline Phosphatase: 71 U/L (ref 39–117)
BUN: 9 mg/dL (ref 6–23)
CO2: 27 mEq/L (ref 19–32)
Calcium: 9.3 mg/dL (ref 8.4–10.5)
Chloride: 104 mEq/L (ref 96–112)
Creatinine, Ser: 0.86 mg/dL (ref 0.4–1.5)
GFR calc Af Amer: >60 mL/min (ref >60)
GFR calc non Af Amer: >60 mL/min (ref >60)
Glucose, Bld: 90 mg/dL (ref 70–99)
Potassium: 3.4 mEq/L — ABNORMAL LOW (ref 3.5–5.1)
Sodium: 138 mEq/L (ref 135–145)
Total Bilirubin: 0.6 mg/dL (ref 0.3–1.2)
Total Protein: 7.1 g/dL (ref 6.0–8.3)

## 2010-09-21 LAB — LIPID PANEL
Cholesterol: 225 mg/dL — ABNORMAL HIGH (ref 0–200)
HDL: 40 mg/dL (ref >39)
LDL Cholesterol: 155 mg/dL — ABNORMAL HIGH (ref 0–99)
Total CHOL/HDL Ratio: 5.6 RATIO
Triglycerides: 149 mg/dL (ref <150)
VLDL: 30 mg/dL (ref 0–40)

## 2010-09-21 LAB — PROTIME-INR
INR: 1.88 — ABNORMAL HIGH (ref 0.00–1.49)
INR: 2.28 — ABNORMAL HIGH (ref 0.00–1.49)
INR: 2.81 — ABNORMAL HIGH (ref 0.00–1.49)
Prothrombin Time: 25.1 seconds — ABNORMAL HIGH (ref 11.6–15.2)
Prothrombin Time: 25.3 seconds — ABNORMAL HIGH (ref 11.6–15.2)

## 2010-09-23 LAB — PROTIME-INR
INR: 1.61 — ABNORMAL HIGH (ref 0.00–1.49)
Prothrombin Time: 19.3 seconds — ABNORMAL HIGH (ref 11.6–15.2)

## 2010-09-26 LAB — PROTIME-INR
INR: 1.15 (ref 0.00–1.49)
Prothrombin Time: 14.6 seconds (ref 11.6–15.2)

## 2010-10-15 LAB — URINALYSIS, ROUTINE W REFLEX MICROSCOPIC
Glucose, UA: NEGATIVE mg/dL
Protein, ur: NEGATIVE mg/dL
Specific Gravity, Urine: 1.022 (ref 1.005–1.030)
Urobilinogen, UA: 1 mg/dL (ref 0.0–1.0)

## 2010-10-15 LAB — RAPID URINE DRUG SCREEN, HOSP PERFORMED
Barbiturates: NOT DETECTED
Benzodiazepines: NOT DETECTED
Opiates: NOT DETECTED

## 2010-10-15 LAB — DIFFERENTIAL
Basophils Relative: 1 % (ref 0–1)
Lymphocytes Relative: 40 % (ref 12–46)
Monocytes Absolute: 0.6 10*3/uL (ref 0.1–1.0)
Monocytes Relative: 8 % (ref 3–12)
Neutro Abs: 3.8 10*3/uL (ref 1.7–7.7)
Neutrophils Relative %: 50 % (ref 43–77)

## 2010-10-15 LAB — POCT CARDIAC MARKERS
CKMB, poc: <1 ng/mL — ABNORMAL LOW (ref 1.0–8.0)
Myoglobin, poc: 58.7 ng/mL (ref 12–200)
Troponin i, poc: <0.05 ng/mL (ref 0.00–0.09)

## 2010-10-15 LAB — BASIC METABOLIC PANEL
CO2: 27 mEq/L (ref 19–32)
Calcium: 9.1 mg/dL (ref 8.4–10.5)
Creatinine, Ser: 1.41 mg/dL (ref 0.4–1.5)
GFR calc Af Amer: >60 mL/min (ref >60)
GFR calc non Af Amer: 55 mL/min — ABNORMAL LOW (ref >60)
Sodium: 142 mEq/L (ref 135–145)

## 2010-10-15 LAB — CBC
Hemoglobin: 16.4 g/dL (ref 13.0–17.0)
MCHC: 34.8 g/dL (ref 30.0–36.0)
RBC: 4.94 MIL/uL (ref 4.22–5.81)
WBC: 7.7 10*3/uL (ref 4.0–10.5)

## 2010-10-15 LAB — PROTIME-INR: INR: 1.3 (ref 0.00–1.49)

## 2010-10-15 LAB — APTT: aPTT: 31 seconds (ref 24–37)

## 2010-10-16 LAB — DIFFERENTIAL
Basophils Absolute: 0 10*3/uL (ref 0.0–0.1)
Basophils Relative: 1 % (ref 0–1)
Neutro Abs: 3.3 10*3/uL (ref 1.7–7.7)
Neutrophils Relative %: 49 % (ref 43–77)

## 2010-10-16 LAB — PROTIME-INR
INR: 1.7 — ABNORMAL HIGH (ref 0.00–1.49)
Prothrombin Time: 20.6 seconds — ABNORMAL HIGH (ref 11.6–15.2)

## 2010-10-16 LAB — CBC
MCHC: 33.7 g/dL (ref 30.0–36.0)
Platelets: 183 10*3/uL (ref 150–400)
RDW: 13.6 % (ref 11.5–15.5)

## 2010-11-03 ENCOUNTER — Other Ambulatory Visit: Payer: Self-pay | Admitting: Internal Medicine

## 2010-11-03 MED ORDER — ALBUTEROL SULFATE HFA 108 (90 BASE) MCG/ACT IN AERS: 2.0000 | INHALATION_SPRAY | Freq: Four times a day (QID) | RESPIRATORY_TRACT | Status: DC | PRN

## 2010-11-03 MED ORDER — ALBUTEROL SULFATE (2.5 MG/3ML) 0.083% IN NEBU: 2.5000 mg | INHALATION_SOLUTION | Freq: Three times a day (TID) | RESPIRATORY_TRACT | Status: DC

## 2010-11-25 NOTE — Consult Note (Signed)
[name] [name], SALEK                 ACCOUNT NO.:  [id number]   MEDICAL RECORD NO.:  [id number]          PATIENT TYPE:  INP   LOCATION:  1603                         FACILITY:  [facility]   PHYSICIAN:  Antonietta Breach, M.D.  DATE OF BIRTH:  01-24-1966   DATE OF CONSULTATION:  03/28/2006  DATE OF DISCHARGE:  03/28/2006                                   CONSULTATION   REQUESTING PHYSICIAN:  Isidor Holts, M.D.   REASON FOR CONSULTATION:  Depression.   HISTORY OF PRESENT ILLNESS:  [name] is a 45 year old male admitted  to the [facility] on September 15 due to deep venous  thrombosis.   The patient was initially admitted to the [facility] on September 11 due to depression.   The patient developed noncompliance with his Coumadin.  He developed a  depressed mood and low energy as well as decreased concentration.  He was  treated at the [facility] but had to be transferred over to the  [facility] due to deep vein thrombosis  complications.   He has been started on Wellbutrin 150 mg p.o. b.i.d. for antidepression.  On  the day of evaluation, [name] is already showing improvement.  His  Wellbutrin was initially 150 mg and this was increased this week.  He now  reports hope for the future and there are no suicidal thoughts.  He has no  thoughts of harming others, no delusions, no hallucinations.  He has  interests intact.  His appetite is stable.  He does have physical pain which  can disrupt his sleep.  He has intention of being compliant with his  medication.  He also reports that his sister will allow him to stay with her  and he has been in contact with his old job.  He states they will hire him  back.   PAST PSYCHIATRIC HISTORY:  The patient has a history of a prior admission to  [facility].  He also has a history of cocaine and  polysubstance abuse.   The patient's  depression does include a history of suicidal planned  overdose; however, this has resolved, as mentioned above.   The patient first used cocaine at age 89.  He first used alcohol at age 52.  The patient's alcohol has involved 6 beers per week.  He has attained 5  years of sobriety and abstinence during his efforts to remain abstinent.   SOCIAL HISTORY:  Occupation:  Unemployed but reports that his old employer  is planning to take him back.  He has been living homeless but has plans now  to live with is sister.  Marital:  Separated.  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   GENERAL MEDICAL PROBLEMS:  1. Deep venous thrombosis.  2. Hypertension.   LABORATORY DATA:  TSH 0.696.  INR 2.9.  His chest x-ray on September 10 was  negative.  CBC within normal limits.  Chemical panel unremarkable.   REVIEW OF SYSTEMS:  CONSTITUTIONAL:  Afebrile.  HEAD:  No trauma.  NEUROLOGIC:  Unremarkable.  PSYCHIATRIC:  As above.  CARDIOVASCULAR:  No  chest pain, palpitations, or edema.  RESPIRATORY:  No coughing or wheezing.  GASTROINTESTINAL:  No nausea, vomiting, or diarrhea.  GENITOURINARY:  No  dysuria.  SKIN:  Unremarkable.  MUSCULOSKELETAL:  There is chronic right leg  pain at this time, consistent with his DVT.  ENDOCRINE/METABOLIC:  Unremarkable.  HEMATOLOGIC/LYMPHATIC:  Unremarkable.   EXAMINATION:  VITAL SIGNS:  Temperature 98.1.  Pulse 88.  Respirations 20.  Blood pressure 114/84.  Oxygen saturation on room air is 98%.   MENTAL STATUS EXAM:  [name] is an alert male appearing his stated age,  who demonstrates normal reciprocity and good eye contact.  He is oriented to  all spheres.  His memory is intact to immediate, recent, remote.  Thought  process logical, coherent, goal-directed.  No looseness of association  thought content.  No thoughts of harming himself.  No thoughts of harming  others.  No delusions.  No hallucinations.  Fund of knowledge and  intelligence are average.   Mood is within normal limits.  Affect is mildly  constricted at baseline but with a broad, appropriate response.  His  concentration is within normal limits.  Judgment is within normal limits.  Insight is partial.   ASSESSMENT:  Axis I:  1. Mood disorder, not otherwise specified, 293.83, depressed, now stable      (the patient has a history of functional as well as substance abuse and      general medical factors).  2. Polysubstance dependence.  Axis II: Deferred.  Axis III:  See general medical problems.  Axis IV: General medical and primary support group as well as economic.  Axis V: 55.   [name] is not at risk to harm himself or others.  He agrees to use  Emergency Services for any psychiatric emergency symptoms.   RECOMMENDATION:  1. The patient will continue with his Wellbutrin 150 mg b.i.d. for      antidepression.  2. Would recommend 12-Step groups and alcohol drug services downtown.      Other alternatives for relapse prevention include the intensive      outpatient programs at [facility], [facility], or [facility].   Recommended the patient have individual psychotherapy set up as part of his  outpatient psychiatric treatment.      Antonietta Breach, M.D.  Electronically Signed     JW/MEDQ  D:  04/01/2006  T:  04/02/2006  Job:  [id number]

## 2010-11-25 NOTE — H&P (Signed)
[name] [name], [name] [name]                 ACCOUNT NO.:  [id number]   MEDICAL RECORD NO.:  [id number]          PATIENT TYPE:  INP   LOCATION:  0306                          FACILITY:  [facility]   PHYSICIAN:  Anselm Jungling, MD  DATE OF BIRTH:  1965-08-27   DATE OF ADMISSION:  03/20/2006  DATE OF DISCHARGE:                         PSYCHIATRIC ADMISSION ASSESSMENT   IDENTIFYING INFORMATION:  This is a 45 year old African-American male who is  married and currently separated.  This is a voluntary admission.   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  Relapsed on alcohol about 1-2 weeks ago and has been drinking a 6-  pack daily.  He has also resumed abusing cocaine which he has been using  daily and his urine drug screen was positive for cocaine.  His alcohol level  was less than 15 in the emergency room.  He endorses shame, worthlessness  and depressed mood over his relapse on drugs and alcohol, is ashamed of his  failure to abstain from substances.  He is currently homeless and has been  staying in a motel room for the last several days, denies any homicidal  thoughts, denies any IV drug use, denies any auditory hallucinations.   PAST PSYCHIATRIC HISTORY:  This is the patient's second admission to [facility] with his previous discharge being February 16, 2006.  At that time he was discharged after being detoxed also from drugs  and alcohol and went back to live with his wife and pursued outpatient  followup.  Was able to stay clean and sober about 3 weeks.  The patient has  a history of substance abuse, cocaine, alcohol and marijuana to which he  partially attributes failure of his marriage.   SOCIAL HISTORY:   The patient previously was a truck driver for a delivery  company, has currently separated from his wife, has a son and some  stepchildren, had previously been married for 3 years.   FAMILY HISTORY:  Unclear.   ALCOHOL AND DRUG HISTORY:  Is noted above.  The patient's longest period  clean was for 5 years until he initially relapsed on drugs and alcohol  approximately 7 months ago.   PAST MEDICAL HISTORY:  The patient was diagnosed on his last stay here at  [facility] with DVT in his right calf.  Internal  medicine was consulted.  The patient was placed on Lovenox and Coumadin and  reports that he was compliant with his Coumadin 12 mg and outpatient  followup until he relapsed on drugs about a week ago.  Last time he took  Coumadin was about 1 week ago.   MEDICATIONS:  Amantadine 100 mg p.o. b.i.d., Wellbutrin XL 150 mg daily,  Protonix 40 mg daily, Coumadin 12 mg daily, and Lovenox previously at 95  mg  per day, had been discontinued when he became therapeutic on Coumadin.   DRUG ALLERGIES:  None.   POSITIVE PHYSICAL FINDINGS:  The patient's full physical examination was  done in the emergency room.  It is noted in the record.  Generally this is a  healthy-appearing African-American male who is in no acute distress.  On  admission to the unit, he weighed 193 pounds, 6 feet 2.5 inches tall,  temperature 97.9, pulse 74, respirations 109/78, pulse 74, respirations 18.   DIAGNOSTIC STUDIES:  The patient's urine drug screen was positive for  cocaine.  CBC: WBC 7.4, hemoglobin 16.9, hematocrit 48.4, and platelets  175,000.  Chemistries: Sodium 139, potassium 3.2, chloride 102, CO2 30, BUN  7, creatinine 1.2, random glucose was 128.  Liver enzymes: SGOT 15, SGPT 15,  alkaline phosphatase 58, and total bilirubin 1.0.  Calcium was within normal  limits at 9.1.  Alcohol level was less than 5.  His routine urinalysis was  unremarkable although he did have slightly  elevated specific gravity at  1.038.  Urine glucose was 100, and he did show trace of ketones.  The  patient's INR in the emergency room was 1.1.   MENTAL STATUS EXAM:  Disheveled male who is huddled under the covers in bed.  He is in no distress.  He is fully alert, very poor eye contact.  Affect is  blunted.  He is cooperative.  Speech reveals minimal production.  He is  barely audible, can hardly understand anything he says.  He just mumbles.  Offers very little.  Mood is hopeless and helpless.  He is quite ashamed of  his drug use, says he has nowhere to go, nowhere to live, is not sure what  to do next.  Very much ashamed of his failure to be able to abstain from  substances.  Thought process is positive for suicidal ideation with a plan  to overdose, no homicidal thoughts, no psychosis.  Cognitively he is intact  and oriented x3.   ADMISSION DIAGNOSIS:  AXIS I:  Depressive disorder not otherwise specified,  ethyl alcohol abuse rule out dependence, cocaine dependence as evidenced by  his drug cravings and inability to stay clean.  AXIS II:  No diagnosis.  AXIS III:  DVT right leg.  AXIS IV:  Severe problems, homeless and jobless.  The patient's asset is  that he does have some supportive family and does have job skills for re-  employment.  AXIS V:  Current 30, past year 77.   INITIAL PLAN OF CARE:  Plan is to voluntarily admit the patient with q.15  minute checks in place, to safely detox him from alcohol within 5 days and  to alleviate his suicidal thought.  We have enrolled him in dual diagnosis  program and he has been cooperative with staff and peers.  We are going to  ask pharmacy to manage his coag protocol and they will start today.  We will  resume his Lovenox until he is at a therapeutic level, and we will schedule  him for followup in the internal medicine clinic.  We will also start him on Wellbutrin 150 mg XL daily and he is on Librium 25 mg q.6h p.r.n.   withdrawal.   ESTIMATED LENGTH OF STAY:  5 days.      Margaret A. Lorin Picket, N.P.      Anselm Jungling, MD  Electronically Signed    MAS/MEDQ  D:  03/20/2006  T:  03/20/2006  Job:  [id number]

## 2010-11-25 NOTE — H&P (Signed)
[name] [name], [name] [name]                 ACCOUNT NO.:  [id number]   MEDICAL RECORD NO.:  [id number]          PATIENT TYPE:  EMS   LOCATION:  ED                           FACILITY:  [facility]   PHYSICIAN:  Merlene Laughter. Renae Gloss, M.D.DATE OF BIRTH:  Dec 05, 1965   DATE OF ADMISSION:  03/24/2006  DATE OF DISCHARGE:                                HISTORY & PHYSICAL   PRIMARY CARE PHYSICIAN:  He is unassigned.   PSYCHIATRIST:  Anselm Jungling, M.D.   CODE STATUS:  This patient is a full code.   CHIEF COMPLAINT:  Right leg DVT.   HISTORY OF PRESENT ILLNESS:  [name] [name] is a 45 year old gentleman who  presented with depression a month ago and was treated as an inpatient at  [facility].  During that time, he was noted to have a right leg DVT,  which was treated while he was an inpatient at behavioral health with  Lovenox followed by Coumadin.  After discharge, however, he did not take his  Coumadin as directed.  He presented back to [facility] on March 20, 2006 with acute major depression and was again admitted.  His Coumadin  had been restarted; however, over the last 3 days, he has had increasing  right leg pain.  A Doppler study that was done today reveals a right near  occlusive DVT of the proximal mid popliteal vein.  He is admitted to the  medical service for further treatment of and observation of right leg DVT.   PAST MEDICAL HISTORY:  1. Depression.  2. Polysubstance abuse.   FAMILY HISTORY:  Significant for hypertension in mother.   SOCIAL HISTORY:  [name] [name] is unemployed and homeless.  He does report a  long history of tobacco and cocaine use.  He has been abstinent for long  periods of time in the past from cocaine; however, he has recently had a  relapse.  He also has occasional alcohol use, and reports drinking about a 6-  pack per week.   DRUG ALLERGIES:  No known drug allergies.   MEDICATIONS:  1. Nicotine patch.  2. Bupropion 150 mg p.o. daily.  3.  Amantadine 100 mg p.o. b.i.d.  4. Protonix 40 mg p.o. daily.  5. Ambien 10 mg p.o. q.h.s. p.r.n.  6. Librium 25 mg q.6h. p.r.n.   REVIEW OF SYSTEMS:  [name] [name] denies chest pain or shortness of breath,  nausea, vomiting, fever or chills.  The remainder of his review of systems  is as per HPI.  Greater than 10 systems have been reviewed.   PHYSICAL EXAMINATION:  GENERAL APPEARANCE:  Well-developed, well-nourished  male in no acute distress.  VITAL SIGNS:  Blood pressure 125/91, respirations 16, pulse 56, temperature  98.2.  HEENT:  No oropharyngeal lesions.  NECK:  Supple.  No masses.  There were 2+ carotids.  No bruits.  LUNGS:  Clear to auscultation.  HEART:  S1 and S2.  Regular rate and rhythm.  No murmurs, rubs, or gallops.  ABDOMEN:  Soft, nontender, nondistended.  Positive bowel sounds.  EXTREMITIES:  Right calf swelling and  mild erythema.  SKIN:  Warm, intact.  NEUROLOGIC:  Alert and oriented x3.  Cranial nerves intact.   ASSESSMENT AND PLAN:  1. Right leg deep vein thrombosis.  [name] [name]'s symptoms have evidently      progressed due to his noncompliance with Coumadin.  Coumadin has been      restarted and is almost therapeutic with an INR of 1.9 today.  We will      continue medical management.  2. Major depression.  [name] [name] will return to [facility] inpatient      evaluation after he has been deemed medically stable.           ______________________________  Merlene Laughter. Renae Gloss, M.D.     KRS/MEDQ  D:  03/24/2006  T:  03/24/2006  Job:  [id number]

## 2010-11-25 NOTE — Discharge Summary (Signed)
[name], [name]                 ACCOUNT NO.:  [id number]   MEDICAL RECORD NO.:  [id number]          PATIENT TYPE:  INP   LOCATION:  1603                         FACILITY:  [facility]   PHYSICIAN:  Michaelyn Barter, M.D. DATE OF BIRTH:  31-Jan-1966   DATE OF ADMISSION:  03/24/2006  DATE OF DISCHARGE:                                 DISCHARGE SUMMARY   This is an interim discharge summary.   FINAL DIAGNOSES:  1. Right leg deep venous thrombosis.  2. Depression.  3. History of substance abuse.  4. Urine drug screen positive for cocaine.   PRIMARY CARE PHYSICIAN:  Unassigned.   PROCEDURES:  Right lower extremity venous Doppler, completed on March 24, 2006.   HISTORY OF PRESENT ILLNESS:  [name] [name] is a 45 year old gentleman with a  past medical history of right leg DVT, which was diagnosed approximately a  month ago.  He indicated that he had not been compliant with his medications  regarding his DVT.  He presented to behavioral health on March 20, 2006  for treatment of acute onset, major depression.  Over the three days leading  up to this admission, he developed increasing right leg pain.   For past medical history, please see that dictated by Dr. Merlene Laughter.  [place].   ASSESSMENT/PLAN:  1. Right leg deep venous thrombosis.  The patient had a Doppler study      completed on September 15th.  The final impression was that there was      right near occlusive deep venous thrombosis of the proximal mid      popliteal vein, nonocclusive deep venous thrombosis of the calf veins.      All other deep vessels were free of deep venous thrombosis.  No      superficial vein thrombosis or baker's cyst.  As a result of the      finding, Coumadin was started, and the patient was provided p.r.n. pain      medication.  Over the past two days, the patient indicates that his leg      still has pain; however, it appears that at rest, the pain is much more      tolerable than with  activity.  Physical therapy has been consulted      secondary to the patient having some difficulties ambulating secondary      to the pain in his right lower extremity.  It appears that when the      patient partakes in physical therapy his pain becomes more pronounced.      Attempts will be made to try to better control the patient's level of      pain before he is discharged.  2. History of depression:  Again, the patient was transferred from      [facility] to [facility] for admission.  The patient has been      maintained on his antidepressant medications.  The plan will be for the      patient to transfer back to [facility] once his right leg pain  is optimally controlled.  3. Urine positive for cocaine:  The patient does have a history for      substance abuse.  This has been monitored during the course of his      hospitalization.  Hopefully, once he returns to [facility], they may be able to give him some help with regards to his      substance abuse.      Michaelyn Barter, M.D.  Electronically Signed     OR/MEDQ  D:  03/27/2006  T:  03/27/2006  Job:  [id number]

## 2010-11-25 NOTE — Discharge Summary (Signed)
[name] [name], [name] [name]                 ACCOUNT NO.:  [id number]   MEDICAL RECORD NO.:  [id number]          PATIENT TYPE:  INP   LOCATION:  1603                         FACILITY:  [facility]   PHYSICIAN:  Isidor Holts, M.D.  DATE OF BIRTH:  11-Mar-1966   DATE OF ADMISSION:  03/24/2006  DATE OF DISCHARGE:  03/28/2006                                 DISCHARGE SUMMARY   For discharge diagnoses, refer to interim summary dated March 27, 2006  by Dr. Michaelyn Barter.   DISCHARGE MEDICATIONS:  1. Bupropion 150 mg p.o. b.i.d.  2. Amantidine 100 mg p.o. b.i.d.  3. Ambien 10 mg p.o. p.r.n. at bedtime for insomnia.  4. Vicodin 5/500 one p.o. p.r.n. q. 6 hours for pain.  A total of 42 pills      have been dispensed.  5. Coumadin 10 mg daily.  Patient has been instructed to have INR checked      on March 30, 2006 at [facility].   CONSULTATIONS:  None.   For procedures, admission history, and detailed clinical course, refer to  above-mentioned interim discharge summary.  As of March 28, 2006, the  patient's clinical condition remained stable.  There were no new issues.  Right lower extremity swelling had markedly subsided and patient had TED  stocking.  He was still experiencing some difficulty with ambulation,  however, he has been seen by physical therapist/occupational therapy and  walker has been recommended as well as continued PT/OT.  INR is therapeutic  at 2.9.   DISPOSITION:  Patient was considered clinically stable for discharge on  March 28, 2006.   Diet:  No restrictions.  Activity:  Instructed to increase activity slowly.  Also utilize walker.  Otherwise per PT/OT.  Wound care:  Not applicable.  Pain management.  Refer to discharge medication list.   FOLLOWUP INSTRUCTIONS:  Patient is instructed to have his  INR checked, and  Coumadin dosage adjusted appropriately, on March 30, 2006 at the [facility].  Also,  patient appears not have a PMD at  present.  He has therefore been instructed to establish a PMD.  Appropriate  information has been supplied.  Patient was seen by a psychiatrist on  March 28, 2006.  He was deemed clinically stable to be discharged back  home and to follow up with the [facility].  This  has been implemented accordingly.   SPECIAL INSTRUCTIONS:  Home health, PT/OT and walker.  Also, patient  psychiatric followup at [facility].      Isidor Holts, M.D.  Electronically Signed     CO/MEDQ  D:  03/28/2006  T:  03/28/2006  Job:  [id number]

## 2010-11-25 NOTE — Discharge Summary (Signed)
NAMEDELMER, KOWALSKI                 ACCOUNT NO.:  [id number]   MEDICAL RECORD NO.:  [id number]          PATIENT TYPE:  IPS   LOCATION:  0303                          FACILITY:  [facility]   PHYSICIAN:  Jasmine Pang, M.D. DATE OF BIRTH:  [birth year]   DATE OF ADMISSION:  02/06/2006  DATE OF DISCHARGE:  02/14/2006                                 DISCHARGE SUMMARY   IDENTIFYING INFORMATION:  The patient is a 45 year old married African-  American male who was admitted on a voluntary basis to my service on  02/06/2006.   HISTORY OF PRESENT ILLNESS:  The patient presented in the ED after calling  the emergency hot line.  He states he had strong suicidal urge to overdose  on pills the evening prior to admission. He has a history of prior overdose  in 1991. The patient relapsed on cocaine six months ago and after having  five years clean.  He also uses alcohol intermittently.  His alcohol level  in the ED was 147.  He states he is unable to stay sober again. He is  feeling under a lot of stress and anxiety.  Upon admission the patient  stated I felt like dying.  He stated he has been having problems with his  wife.  He was hopeless about being able to stop using drugs and alcohol. He  discussed stressors including job pressures, financial conflict with wife  and his drug and alcohol use.  He has admitted to using alcohol, cocaine and  marijuana.  The patient works at [employer].  He has been married for three years.  He has some step children and a son.   PAST PSYCHIATRIC HISTORY:  There is history of prior overdose in 1991.  At  least one prior detox with rehab at [facility] in the past.  Prior clean for five  years and relapsed six months ago.   SUBSTANCE ABUSE HISTORY:  The patient uses tobacco. The rest of substance  abuse history is above.   PAST MEDICAL HISTORY:  Medical problems:  Right calf pain.   MEDICATIONS:  None.   ALLERGIES:  No known drug allergies.   PHYSICAL EXAMINATION:  The  patient was complaining of right calf pain on  movement. There was edema of his right calf and a positive Homans sign. He  was sent for Doppler and found to have thrombophlebitis in his right leg.  This was managed by our nurse practitioner throughout the hospitalization  with Coumadin.   ADMISSION LABORATORY DATA:  The CBC revealed a WBC of 8.1, hemoglobin 16,  hematocrit 47.7, glucose 161.  The basic chem panel revealed sodium of 141,  potassium of 3.3, chloride 101, CO2 33, BUN 4, creatinine 1.1, glucose 81.  Hepatic profile: SGOT 18, SGPT 20, alkaline phosphatase 43, total bilirubin  0.7.  Urinalysis was negative. Urine drug screen was positive for cocaine.  The TSH was 1.345 (0.35 to 5.5).   HOSPITAL COURSE:  Upon admission the patient was placed on Ambien 2 mg p.o.  q.h.s. p.r.n., may repeat x1 if needed.  On 02/06/2006, the patient  was sent  for Doppler ultrasound to his right calf area.  He was found to have a  thrombophlebitis.  As indicated above this was managed by our nurse  practitioner and pharmacist, who were able to get a PT/INR within normal  limits.  On 02/06/2006, Lovenox 90 mg subcutaneous was started q.12h. for DVT  treatment.  On the same day, warfarin 10 mg p.o. x1 dose was ordered if the  above INR was normal.  On 02/06/2006, the patient was also started on  Protonix 40 mg p.o. daily.  Hemoccult of stools was ordered as well.  On  02/06/2006, multivitamin tablet daily was ordered, thiamine 100 mg p.o. daily  was ordered. On 02/07/2006, Lovenox was changed to 95 mg subcutaneously  q.12h., Coumadin 10 mg today at 6 p.m., daily PT/INR ordered while on  Coumadin. The Coumadin protocol as per the pharmacy was used and his PT/INR  and Coumadin dose were managed by our pharmacist.  On 02/07/2006, the patient  was started on Darvocet-N 100 1 tab p.o. t.i.d. p.r.n. pain of his leg,  first dose now.  On 02/08/2006, the patient was given 8 mg of Coumadin.  The  daily pro times were  ordered to be continued.  On 02/08/2006, the patient was  placed on a nicotine patch 14 mg daily.  On 02/09/2006, Coumadin 15 mg p.o.  was given that day.  On 02/10/2006, Darvocet was discontinued and instead he  was started on Vicoprofen 5/500 1-2 tablets q.6h. p.r.n. pain.  On 02/10/2006,  Coumadin 15 mg x1 was given based on his PT/INR profile.  On 02/10/2006, the  Vicoprofen was changed to 7.5 mg/200 1-2 tabs p.o. q.6h.  p.r.n. pain.  On  02/10/2006, the patient was started on OxyContin 20 mg p.o. q.12h.  On  02/11/2006, OxyContin was decreased to 20 mg p.o. q.h.s.  He was also started  on Percocet 5/325 1-2 tabs p.o. q.6h. p.r.n. breakthrough pain.  The  Vicoprofen was discontinued.  On 02/11/2006, Coumadin 15 mg was ordered as per  the Coumadin protocol.  On 02/11/2006, the patient was given Phenergan 25 mg  now and then q.4h. p.r.n. nausea due to GI upset.  On 02/11/2006, OxyContin  was discontinued.  On 02/12/2006, the patient was given Coumadin 15 mg at 6  p.m.  PT/INR continued to be monitored by the nurse practitioner and our  pharmacist. On 02/13/2006, the patient was given Coumadin 12 mg daily per the  Coumadin protocol.  On 02/14/2006, Lovenox was discontinued, Coumadin was  given 12 mg with continued monitoring of PT/INR.  The patient tolerated his  medications well with no significant side effects.  He was in a fair amount  of pain from his thrombophlebitis.   On 02/06/2006, we met with the patient for the first time. He talked about  his depression and feelings or hopelessness and worthlessness.  He stated  there was conflict with his wife because he could not get off drugs and  alcohol. He stated she was very disappointed and angry with him at this  time. He admitted to using alcohol, cocaine and marijuana.  He stated he had  significant job pressures at [employer], financial pressures and conflict with his wife as well as the stress coming from the drug and alcohol dependence.  On  02/06/2006, an internal  medicine consult was gotten for his right calf DVT.  They helped manage his treatment for the deep venous thrombosis.  On  02/07/2006, the patient talked about  stressors again. He talked about his job,  stating he had just switched jobs making less money. His wife was not happy  with this.  There was less income.  He also stated his wife was very upset  and disappointed about him using drugs.  He stated making payments on bills  was hard.  He was worried about the blood clot in his leg.  He feels like he  has a major ordeal ahead of him because he needs to be very careful on the  Coumadin.  On 02/08/2006, the patient was in a wheelchair due to the blood  clot. He stated his wife was still angry with him and feels that he may have  to go live with his mother. He wants to know when he is going to be  discharged, but feels that the stay here has been beneficial. On 02/09/2006,  the patient was still distraught to some extent because his wife was angry  with him. He stated, She blames my blood clot in my leg on my drug use.  We were still trying to regulate the Coumadin and INR/PT.  I questioned  whether we could get his wife in for a family session but he did not feel  this would be prudent or possible.  On 02/10/2006, the patient was feeling  better. He was cooperative, affect was brighter. On 02/11/2006, there was much  better pain relief on OxyContin which had been started the day before.  His  1+ edema had not changed. He was not having suicidal thoughts.   On 02/12/2006, the patient discussed wanting to go home.  He talked about his  wife visiting.  He stated things are going better with her and he intends to  go home to live with her.  He was disappointed that he could not leave since  his Coumadin and coags were not regulated yet. On 02/13/2006, the patient  talked about the improved relationship with his wife. She is less angry at  him.  His unemployment has started which helps with the bills and  has taken  a lot of stress off of her.  On 02/13/2006, mental status had improved  markedly. The patient was friendly and warm and engaging with good eye  contact, speech normal, rate was slow. Psychomotor activity was somewhat  limited by his need for a wheelchair but overall was within normal limits.  Mood was less depressed and anxious and upon admission affect was wide  ranging. There was no suicidal or homicidal ideations, no self injurious  behavior, no auditory or visual hallucinations, no delusions or paranoia.  Thoughts were logical and goal directed.  Thought content: No predominant  seen.  Cognitive exam was within normal limits.  The patient was felt to be  safe to be discharged home and he left with his wife.   DISCHARGE DIAGNOSES:  Axis I:  Depressive disorder NOS. Cocaine abuse.  Axis II:  None.  Axis III:  Right calf deep venous thrombosis. Axis IV:  Severe (marital conflict, economic problem, occupational problem).  Axis V:  GAF upon discharge was 47.  GAF upon admission was 36.  GAF highest  past year was 70.   DISCHARGE INSTRUCTIONS:  There were no specific dietary restrictions.  His  activity level was restricted by his right calf DVT.  He will see Dr.  Okey Dupre and Dr. Meredith Pel [facility] 02/16/2006  at 1:30 p.m.  They will advise him about  his activity level.   DISCHARGE MEDICATIONS:  1. Amantadine 100 mg p.o. b.i.d.  2. Wellbutrin XL 150 mg daily.  3. Protonix 40 mg daily.  4. Coumadin 12 mg tablets 1 on the day of discharge at 5 p.m. and 1 the      next day at 5 p.m. then he is to return to clinic. The INR upon      discharge was 2.2.  5. Lovenox 95 mg was discontinued.   POST HOSPITAL CARE PLAN:  The patient will see Dr. Lolly Mustache at [facility].  He has an appointment on Wednesday  September 19th at 9 a.m.  He also has an appointment at the [facility]  Wednesday 02/15/2006 at 10 a.m.       Jasmine Pang, M.D.  Electronically Signed     BHS/MEDQ  D:  02/15/2006  T:  02/15/2006  Job:  [id number]

## 2010-11-25 NOTE — Discharge Summary (Signed)
NAMETAIJUAN, [name] [name]                 ACCOUNT NO.:  [id number]   MEDICAL RECORD NO.:  [id number]          PATIENT TYPE:  IPS   LOCATION:  0306                          FACILITY:  [facility]   PHYSICIAN:  Anselm Jungling, MD  DATE OF BIRTH:  03-Sep-1965   DATE OF ADMISSION:  03/20/2006  DATE OF DISCHARGE:  03/24/2006                                 DISCHARGE SUMMARY   IDENTIFYING DATA/REASON FOR ADMISSION:  The patient is a 45 year old married  African American male who was admitted with depression, suicidal ideation,  and cocaine relapse.  He had been admitted to our service one month prior.  He had a history of deep vein thrombosis at that time.  After discharge, he  relapsed on cocaine, two weeks prior to this readmission, and had been  noncompliant with prescribed medications and treatment since then.  His wife  had apparently picked him up at a motel and brought him to [name], and from there he was medically examined at [facility].  He had also been abusing alcohol.  Please refer to the admission  note for further details pertaining to the symptoms, circumstances and  history that led to his hospitalization.   INITIAL DIAGNOSTIC IMPRESSION:  He was given an initial AXIS I diagnoses of  mood disorder not otherwise specified, polysubstance abuse/dependence, and  rule out substance-induced mood disorder.   MEDICAL/LABORATORY:  The patient's psychiatric treatment course this time  was interrupted somewhat by re-emergence of deep vein thrombosis.  On  March 24, 2006, the fifth hospital day, the patient described severe leg  pain, and he had notable lower extremity swelling on that side.  He was sent  to [name] for further evaluation and treatment, and was  admitted medically there, at which time he was administratively discharged  from our psychiatric service.   During his inpatient stay, the patient had been on a Coumadin regimen that  was monitored by the nurse practitioner and the pharmacist.  The patient was  also treated with Protonix 40 mg daily for gastroesophageal reflux disease.   HOSPITAL COURSE:  The patient was admitted to the adult inpatient  psychiatric service.  He presented as a well-nourished, normally developed  [place] male who initially was quite sleepy, having been binging on  cocaine, and having been up all night with the admission process.  He was  fully oriented.  His thoughts and speech were normally organized.  There  were no signs or symptoms of psychosis.  He appeared depressed, and admitted  to suicidal ideation.  He was minimally responsive to efforts at  interviewing him.   The patient was placed on a regimen of Symmetrel 100 mg twice daily to  address cocaine craving.   He was also treated with Wellbutrin XL 150 mg daily to address depressive  symptoms.   On the second hospital day, the patient indicated that he had slept well but  was still tired.  He reported minimal withdrawal symptoms at that time.  He  agreed to attend therapeutic groups and activities.  On the third day, the patient was up, dressed, and well-groomed.  He was  active in the milieu.  He was polite, pleasant, and well-organized.  He  talked of wanting to obtain residential chemical dependency treatment and  was actively pursuing on his own information and necessary applications.  At  that point, he indicated he felt fine physically.  We discussed the  possibility of a family meeting, but he could not identify any family or  friends that he would want to have come in for such a meeting.   On the following day, March 23, 2006, the patient did present with the  above-referenced leg pain, which appeared to be suggestive of a recurrence  of his deep vein thrombosis.  He was transferred to the emergency  department, and administratively discharged the following day.   AFTERCARE:  The patient's aftercare  was to be determined by the medical  social worker at [facility] where he was medically treated for his  deep vein thrombosis.   MEDICATIONS:  Medications at the time of discharge from our service were:  1. Wellbutrin XL 150 mg daily.  2. Symmetrel 100 mg twice daily.  3. Protonix 40 mg daily.  4. Coumadin regimen to be determined by his medical physicians at [facility].   DISCHARGE DIAGNOSES:  AXIS I:  Mood disorder not otherwise specified.  Polysubstance abuse/dependence.  AXIS II:  Deferred.  AXIS III:  History of deep vein thrombosis, gastroesophageal reflux disease.  AXIS IV:  Stressors:  Severe.  AXIS V:  GAF on discharge 60.      Anselm Jungling, MD  Electronically Signed     SPB/MEDQ  D:  04/04/2006  T:  04/05/2006  Job:  [phone]

## 2011-02-16 ENCOUNTER — Encounter: Payer: Self-pay | Admitting: Internal Medicine

## 2011-04-14 LAB — POCT I-STAT, CHEM 8
Glucose, Bld: 92 mg/dL (ref 70–99)
HCT: 54 % — ABNORMAL HIGH (ref 39.0–52.0)
Hemoglobin: 18.4 g/dL — ABNORMAL HIGH (ref 13.0–17.0)
Potassium: 3.7 mEq/L (ref 3.5–5.1)

## 2011-04-14 LAB — DIFFERENTIAL
Basophils Relative: 1 % (ref 0–1)
Eosinophils Absolute: 0.2 10*3/uL (ref 0.0–0.7)
Lymphocytes Relative: 43 % (ref 12–46)
Lymphs Abs: 2.6 10*3/uL (ref 0.7–4.0)
Neutro Abs: 2.8 10*3/uL (ref 1.7–7.7)
Neutrophils Relative %: 46 % (ref 43–77)

## 2011-04-14 LAB — D-DIMER, QUANTITATIVE: D-Dimer, Quant: <0.22 ug/mL-FEU (ref 0.00–0.48)

## 2011-04-14 LAB — CBC
MCV: 95.1 fL (ref 78.0–100.0)
Platelets: 206 10*3/uL (ref 150–400)
WBC: 6.2 10*3/uL (ref 4.0–10.5)

## 2011-04-14 LAB — POCT CARDIAC MARKERS
CKMB, poc: <1 ng/mL — ABNORMAL LOW (ref 1.0–8.0)
Myoglobin, poc: 51.3 ng/mL (ref 12–200)

## 2011-04-14 LAB — PROTIME-INR
INR: 1.4 (ref 0.00–1.49)
Prothrombin Time: 18.1 seconds — ABNORMAL HIGH (ref 11.6–15.2)

## 2011-05-26 ENCOUNTER — Encounter (HOSPITAL_COMMUNITY): Payer: Self-pay | Admitting: Emergency Medicine

## 2011-05-26 ENCOUNTER — Observation Stay (HOSPITAL_COMMUNITY)
Admission: EM | Admit: 2011-05-26 | Discharge: 2011-05-31 | Disposition: A | Discharge status: Home / Self Care | Payer: Medicaid - Out of State | Attending: Internal Medicine | Admitting: Internal Medicine

## 2011-05-26 ENCOUNTER — Emergency Department (HOSPITAL_COMMUNITY): Payer: Medicaid - Out of State

## 2011-05-26 DIAGNOSIS — J45901 Unspecified asthma with (acute) exacerbation: Secondary | ICD-10-CM | POA: Diagnosis present

## 2011-05-26 DIAGNOSIS — F329 Major depressive disorder, single episode, unspecified: Secondary | ICD-10-CM | POA: Insufficient documentation

## 2011-05-26 DIAGNOSIS — F17200 Nicotine dependence, unspecified, uncomplicated: Secondary | ICD-10-CM | POA: Insufficient documentation

## 2011-05-26 DIAGNOSIS — Z7901 Long term (current) use of anticoagulants: Secondary | ICD-10-CM

## 2011-05-26 DIAGNOSIS — J4540 Moderate persistent asthma, uncomplicated: Secondary | ICD-10-CM | POA: Insufficient documentation

## 2011-05-26 DIAGNOSIS — I82409 Acute embolism and thrombosis of unspecified deep veins of unspecified lower extremity: Secondary | ICD-10-CM

## 2011-05-26 DIAGNOSIS — K219 Gastro-esophageal reflux disease without esophagitis: Secondary | ICD-10-CM | POA: Insufficient documentation

## 2011-05-26 DIAGNOSIS — J441 Chronic obstructive pulmonary disease with (acute) exacerbation: Principal | ICD-10-CM | POA: Insufficient documentation

## 2011-05-26 DIAGNOSIS — J45909 Unspecified asthma, uncomplicated: Secondary | ICD-10-CM

## 2011-05-26 DIAGNOSIS — R209 Unspecified disturbances of skin sensation: Secondary | ICD-10-CM | POA: Insufficient documentation

## 2011-05-26 DIAGNOSIS — F1910 Other psychoactive substance abuse, uncomplicated: Secondary | ICD-10-CM | POA: Diagnosis present

## 2011-05-26 DIAGNOSIS — F3289 Other specified depressive episodes: Secondary | ICD-10-CM | POA: Insufficient documentation

## 2011-05-26 DIAGNOSIS — K5900 Constipation, unspecified: Secondary | ICD-10-CM | POA: Insufficient documentation

## 2011-05-26 DIAGNOSIS — Z86718 Personal history of other venous thrombosis and embolism: Secondary | ICD-10-CM | POA: Insufficient documentation

## 2011-05-26 HISTORY — DX: Bronchitis, not specified as acute or chronic: J40

## 2011-05-26 LAB — PROTIME-INR: INR: 1.04 (ref 0.00–1.49)

## 2011-05-26 LAB — COMPREHENSIVE METABOLIC PANEL
AST: 19 U/L (ref 0–37)
BUN: 13 mg/dL (ref 6–23)
CO2: 27 meq/L (ref 19–32)
Calcium: 10 mg/dL (ref 8.4–10.5)
Creatinine, Ser: 1.13 mg/dL (ref 0.50–1.35)
GFR calc Af Amer: 90 mL/min — ABNORMAL LOW (ref >90)
GFR calc non Af Amer: 77 mL/min — ABNORMAL LOW (ref >90)
Glucose, Bld: 116 mg/dL — ABNORMAL HIGH (ref 70–99)
Total Bilirubin: 0.3 mg/dL (ref 0.3–1.2)

## 2011-05-26 LAB — DIFFERENTIAL
Basophils Relative: 0 % (ref 0–1)
Lymphocytes Relative: 16 % (ref 12–46)
Monocytes Absolute: 0.3 10*3/uL (ref 0.1–1.0)
Monocytes Relative: 4 % (ref 3–12)
Neutro Abs: 4.8 10*3/uL (ref 1.7–7.7)

## 2011-05-26 LAB — RAPID URINE DRUG SCREEN, HOSP PERFORMED
Barbiturates: NOT DETECTED
Benzodiazepines: NOT DETECTED
Cocaine: NOT DETECTED
Tetrahydrocannabinol: NOT DETECTED

## 2011-05-26 LAB — CBC
HCT: 49.7 % (ref 39.0–52.0)
Hemoglobin: 17.4 g/dL — ABNORMAL HIGH (ref 13.0–17.0)
MCHC: 35 g/dL (ref 30.0–36.0)
MCV: 94.8 fL (ref 78.0–100.0)

## 2011-05-26 MED ORDER — ALBUTEROL SULFATE (5 MG/ML) 0.5% IN NEBU
INHALATION_SOLUTION | RESPIRATORY_TRACT | Status: AC
  Administered 2011-05-26: 5 mg via RESPIRATORY_TRACT
  Filled 2011-05-26: qty 1

## 2011-05-26 MED ORDER — ALBUTEROL SULFATE (5 MG/ML) 0.5% IN NEBU
10.0000 mg | INHALATION_SOLUTION | Freq: Once | RESPIRATORY_TRACT | Status: AC
  Administered 2011-05-26: 10 mg via RESPIRATORY_TRACT
  Filled 2011-05-26: qty 1

## 2011-05-26 MED ORDER — ALBUTEROL SULFATE (5 MG/ML) 0.5% IN NEBU
2.5000 mg | INHALATION_SOLUTION | Freq: Four times a day (QID) | RESPIRATORY_TRACT | Status: DC
  Administered 2011-05-26: 2.5 mg via RESPIRATORY_TRACT
  Administered 2011-05-26: 5 mg via RESPIRATORY_TRACT
  Administered 2011-05-27 – 2011-05-31 (×18): 2.5 mg via RESPIRATORY_TRACT
  Filled 2011-05-26 (×18): qty 0.5

## 2011-05-26 MED ORDER — PREDNISONE 20 MG PO TABS
40.0000 mg | ORAL_TABLET | Freq: Every day | ORAL | Status: DC
  Administered 2011-05-27: 40 mg via ORAL
  Filled 2011-05-26 (×2): qty 2

## 2011-05-26 MED ORDER — ENOXAPARIN SODIUM 30 MG/0.3ML ~~LOC~~ SOLN: 1.0000 mg/kg | Freq: Two times a day (BID) | SUBCUTANEOUS | Status: DC

## 2011-05-26 MED ORDER — WARFARIN SODIUM 7.5 MG PO TABS
15.0000 mg | ORAL_TABLET | Freq: Once | ORAL | Status: AC
  Administered 2011-05-26: 15 mg via ORAL
  Filled 2011-05-26: qty 2

## 2011-05-26 MED ORDER — IPRATROPIUM BROMIDE 0.02 % IN SOLN
1.0000 mg | Freq: Once | RESPIRATORY_TRACT | Status: AC
  Administered 2011-05-26: 1 mg via RESPIRATORY_TRACT
  Filled 2011-05-26: qty 2.5

## 2011-05-26 MED ORDER — PREDNISONE 20 MG PO TABS
60.0000 mg | ORAL_TABLET | Freq: Once | ORAL | Status: AC
  Administered 2011-05-26: 60 mg via ORAL
  Filled 2011-05-26: qty 3

## 2011-05-26 MED ORDER — ENOXAPARIN SODIUM 100 MG/ML ~~LOC~~ SOLN
100.0000 mg | SUBCUTANEOUS | Status: AC
  Administered 2011-05-26: 100 mg via SUBCUTANEOUS
  Filled 2011-05-26: qty 1

## 2011-05-26 MED ORDER — ENOXAPARIN SODIUM 100 MG/ML ~~LOC~~ SOLN
100.0000 mg | Freq: Two times a day (BID) | SUBCUTANEOUS | Status: DC
Start: 2011-05-27 — End: 2011-05-27
  Administered 2011-05-27: 100 mg via SUBCUTANEOUS
  Filled 2011-05-26 (×4): qty 1

## 2011-05-26 MED ORDER — NICOTINE 14 MG/24HR TD PT24
14.0000 mg | MEDICATED_PATCH | Freq: Every day | TRANSDERMAL | Status: DC
  Administered 2011-05-26 – 2011-05-30 (×5): 14 mg via TRANSDERMAL
  Filled 2011-05-26 (×6): qty 1

## 2011-05-26 MED ORDER — GUAIFENESIN 100 MG/5ML PO SOLN
5.0000 mL | ORAL | Status: DC | PRN
  Administered 2011-05-26: 100 mg via ORAL
  Filled 2011-05-26: qty 5

## 2011-05-26 MED ORDER — ALBUTEROL SULFATE (5 MG/ML) 0.5% IN NEBU
2.5000 mg | INHALATION_SOLUTION | RESPIRATORY_TRACT | Status: DC | PRN
  Administered 2011-05-26: 2.5 mg via RESPIRATORY_TRACT
  Filled 2011-05-26 (×2): qty 0.5

## 2011-05-26 MED ORDER — IPRATROPIUM BROMIDE 0.02 % IN SOLN
RESPIRATORY_TRACT | Status: AC
  Administered 2011-05-26: 0.5 mg
  Filled 2011-05-26: qty 2.5

## 2011-05-26 MED ORDER — SODIUM CHLORIDE 0.9 % IJ SOLN
3.0000 mL | Freq: Two times a day (BID) | INTRAMUSCULAR | Status: DC
  Administered 2011-05-26 – 2011-05-31 (×10): 3 mL via INTRAVENOUS

## 2011-05-26 NOTE — ED Notes (Signed)
Pt states breathing is improved since receiving treatment

## 2011-05-26 NOTE — Progress Notes (Signed)
ANTICOAGULATION CONSULT NOTE - Initial Consult  Pharmacy Consult for warfarin Indication: DVT  No Known Allergies  Patient Measurements: Stated weight of 224lb (101.8kg)  Vital Signs: Temp: 97.7 F (36.5 C) (11/16 0731) Temp src: Oral (11/16 0731) BP: 117/68 mmHg (11/16 1355) Pulse Rate: 71  (11/16 1355)  Labs:  Basename 05/26/11 1312  HGB --  HCT --  PLT --  APTT --  LABPROT 13.8  INR 1.04  HEPARINUNFRC --  CREATININE --  CKTOTAL --  CKMB --  TROPONINI --   The CrCl is unknown because both a height and weight (above a minimum accepted value) are required for this calculation.  Medical History: Past Medical History  Diagnosis Date  . Asthma     hospitalized in past, questionable hx of bronchitis dx  . DVT (deep venous thrombosis)     times 2, on coumadin chronically  . Depression     hx of SI  . Substance abuse     crack, cocaine, last use 2007  . Tobacco abuse   . GERD (gastroesophageal reflux disease)   . DVT (deep venous thrombosis)   . 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 However, Mr. Cauble stopped taking his coumadin, which was his own decision. Admitting MD as ordered coumadin and full-dose lovenox. Today INR is normal at 1.04. Before stopping his coumadin he stated he was taking 12.5mg  PO 6x per week and 15mg  PO 1x per week.   Goal of Therapy:  INR 2-3   Plan:  Coumadin 15mg  PO x 1 at 1800 tonight Lovenox 100mg  SQ x 1 now then will follow-up with inpatient weight and renal function for further dosing  Marcheta Horsey, Drake Leach 05/26/2011,2:12 PM

## 2011-05-26 NOTE — ED Notes (Signed)
Pt c/o SOB, prn albuterol given

## 2011-05-26 NOTE — ED Notes (Signed)
PT. REPORTS PROGRESSING SOB WITH PRODUCTIVE COUGH X4 DAYS , NO FEVER OR CHILLS , NO VOMITTING OR DIARRHEA.

## 2011-05-26 NOTE — ED Notes (Signed)
Pt reports having asthma and having an attack this morning at 1 am and another one PTA.  States that he tried his inhaler and nebulizer without relief.  No extreme wheezing noted.  Pt does have slight wheezing noted in his (R) lobe.  No acute distress.  Pt does report coughing up whitish phlegm recently, having a runny nose and nasal drainage.  Skin warm, dry and intact.  Denies chill/fever.  Neuro intact.

## 2011-05-26 NOTE — ED Provider Notes (Signed)
History     CSN: [id number] Arrival date & time: 05/26/2011  6:31 AM   First MD Initiated Contact with Patient 05/26/11 0715      Chief Complaint  Patient presents with  . Shortness of Breath    HPI Pt was seen at 0720.  Per pt, c/o gradual onset and worsening of persistent SOB, cough and wheezing x4 days, worse since overnight last night.  Pt states he has been using his home MDI/nebs with intermittent relief.  Describes his symptoms as "my asthma attack."  LD prednisone approx 8-10 months ago.  Has had frequent admissions for same.   Denies fevers, no CP/palpitations, no abd pain, no back pain, no fevers, no N/V/D, no rash.     Past Medical History  Diagnosis Date  . Asthma     hospitalized in past, questionable hx of bronchitis dx  . DVT (deep venous thrombosis)     times 2, on coumadin chronically  . Depression     hx of SI  . Substance abuse     crack, cocaine, last use 2007  . Tobacco abuse   . GERD (gastroesophageal reflux disease)   . DVT (deep venous thrombosis)   . Bronchitis     Past Surgical History  Procedure Date  . Skin graft     History  Substance Use Topics  . Smoking status: Current Everyday Smoker  . Smokeless tobacco: Not on file  . Alcohol Use: Yes     OCCASIONAL     Review of Systems ROS: Statement: All systems negative except as marked or noted in the HPI; Constitutional: Negative for fever and chills. ; ; Eyes: Negative for eye pain, redness and discharge. ; ; ENMT: Negative for ear pain, hoarseness, nasal congestion, sinus pressure and sore throat. ; ; Cardiovascular: Negative for chest pain, palpitations, diaphoresis, and peripheral edema. ; ; Respiratory: Positive for cough, wheezing, SOB; and negative for stridor. ; ; Gastrointestinal: Negative for nausea, vomiting, diarrhea and abdominal pain, blood in stool, hematemesis, jaundice and rectal bleeding. . ; ; Genitourinary: Negative for dysuria, flank pain and hematuria. ; ; Musculoskeletal:  Negative for back pain and neck pain. Negative for swelling and trauma.; ; Skin: Negative for pruritus, rash, abrasions, blisters, bruising and skin lesion.; ; Neuro: Negative for headache, lightheadedness and neck stiffness. Negative for weakness, altered level of consciousness , altered mental status, extremity weakness, paresthesias, involuntary movement, seizure and syncope.     Allergies  Review of patient's allergies indicates no known allergies.  Home Medications   Current Outpatient Rx  Name Route Sig Dispense Refill  . ALBUTEROL SULFATE (2.5 MG/3ML) 0.083% IN NEBU Nebulization Take 3 mLs (2.5 mg total) by nebulization 3 (three) times daily. And every 3 hours as needed for wheezing or shortness of breath. 75 mL 11  . ALBUTEROL SULFATE HFA 108 (90 BASE) MCG/ACT IN AERS Inhalation Inhale 2 puffs into the lungs every 6 (six) hours as needed. 1 each 11  . FLUTICASONE-SALMETEROL 250-50 MCG/DOSE IN AEPB Inhalation Inhale 1 puff into the lungs 2 (two) times daily. For asthma 60 each 11  . WARFARIN SODIUM 5 MG PO TABS Oral Take 12.5 mg by mouth as directed.       BP 121/73  Pulse 87  Temp(Src) 97.7 F (36.5 C) (Oral)  Resp 20  SpO2 99%  Physical Exam 0725: Physical examination:  Nursing notes reviewed; Vital signs and O2 SAT reviewed;  Constitutional: Well developed, Well nourished, Well hydrated, In no acute distress;  Head:  Normocephalic, atraumatic; Eyes: EOMI, PERRL, No scleral icterus; ENMT: Mouth and pharynx normal, Mucous membranes moist; Neck: Supple, Full range of motion, No lymphadenopathy; Cardiovascular: Regular rate and rhythm, No murmur, rub, or gallop; Respiratory: Breath sounds coarse with exp wheezes bilat. +frequent non-productive cough during exam. No audible wheezing, Normal respiratory effort/excursion; Chest: Nontender, Movement normal; Abdomen: Soft, Nontender, Nondistended, Normal bowel sounds; Extremities: Pulses normal, No tenderness, No edema, No calf edema or  asymmetry.; Neuro: AA&Ox3, Major CN grossly intact.  No gross focal motor or sensory deficits in extremities.; Skin: Color normal, Warm, Dry, no rash.    ED Course  Procedures    MDM  MDM Reviewed: nursing note and vitals Interpretation: x-ray   Dg Chest 2 View  05/26/2011  *RADIOLOGY REPORT*  Clinical Data: Shortness of breath, productive cough, and left- sided chest pain.  CHEST - 2 VIEW  Comparison: 08/14/2010  Findings: There is peribronchial thickening consistent with bronchitis but the lungs are clear of infiltrates and effusions. Heart size and vascularity are normal.  No osseous abnormality. Nipple shadows at both lung bases.  IMPRESSION: Mild bronchitic changes.  Original Report Authenticated By: Gwynn Burly, M.D.    11:23 AM:  Pt continues to cough, c/o "wheezing."  Lungs coarse bilat with scattered wheeze, and is frequently coughing.  States he "usually has to get admitted when I'm like this."  States he usually "stays for at least 4 or 5 days."  This verified in [place].  T/C to [facility], case discussed, including:  HPI, pertinent PM/SHx, VS/PE, dx testing, ED course and treatment.  Agreeable to admit. They will come to ED for eval.       [facility]       Laray Anger, DO 05/26/11 2132

## 2011-05-26 NOTE — ED Notes (Signed)
Report given to Erin RN

## 2011-05-26 NOTE — H&P (Signed)
Hospital Admission Note Date: 05/26/2011  Patient name: [name] [name] Medical record number: [id number] Date of birth: 27-Feb-1966 Age: 45 y.o. Gender: male PCP: Genella Mech, MD, MD  Medical Service: Internal medicine teaching service - Maurice March  Attending physician:  Dr. Meredith Pel    1st Contact: Dr. Candy Sledge    Pager: [phone] 2nd Contact:  Dr. Tonny Branch    Pager: [phone]  After 5 pm or weekends: 1st Contact:   Pager: [phone] 2nd Contact:   Pager: [phone]  Chief Complaint: "Asthma attack"  History of Present Illness:  Mr. Elk is a 46 year old gentleman with past medical history significant for who presents with 7 days of increasing shortness of breath, wheezing, and cough productive of grey colored phlegm. He notes he has been experiencing 2 asthma attacks per night that are increasingly difficult to control for the past 3 nights; he reports has been experiencing on average 1 asthma exacerbation per night for the past 2 months. Over the past week, he has increased his albuterol use to 6 nebulized treatments daily with at least 10 puffs from his albuterol HFA.  He states he is supposed to be taking Advair however has been out of this medication for the last month after returning to [place] from [place]. He denies fever, chills, not assessed, nausea, vomiting, diarrhea, chest pain, syncope or sick contacts.  He believes his recent attacks have been triggered by his girlfriend increasing the heat to greater than 73 at night as well as using scented aerosol sprays.   He states he is prescribed Coumadin for lifelong anticoagulation after experiencing 2 unprovoked DVTs. He notes he stopped taking this medication 4 months ago; he denies any specific reason for stopping his Coumadin.  He denies any increased leg swelling, unilateral leg pain, or pleuritic chest pain.  Additionally, he reports recent onset of bilateral toe tingling and numbness. He notes the symptoms worsened after her  walking for a prolonged period.  He denies any associated weakness or pain and reports involvement of his first 3 toes on each foot.  He has not noticed any other precipitating or worsening factors and reports the sensation and resolves with rest.   He has no other concerns or complaints and is feeling better after receiving a continuous nebulizer treatment in the emergency room as well as 60 mg of oral prednisone.  Meds: Medications Prior to Admission  Medication Sig Dispense Refill  . albuterol (PROVENTIL) (2.5 MG/3ML) 0.083% nebulizer solution Take 3 mLs (2.5 mg total) by nebulization 3 (three) times daily. And every 3 hours as needed for wheezing or shortness of breath.  75 mL  11  . albuterol (VENTOLIN HFA) 108 (90 BASE) MCG/ACT inhaler Inhale 2 puffs into the lungs every 6 (six) hours as needed.  1 each  11  . Fluticasone-Salmeterol (ADVAIR DISKUS) 250-50 MCG/DOSE AEPB Inhale 1 puff into the lungs 2 (two) times daily. For asthma  60 each  11  . warfarin (COUMADIN) 5 MG tablet Take 12.5 mg by mouth as directed.         Allergies: NKDA  Past Medical History  Diagnosis Date  . Asthma     hospitalized in past, questionable hx of bronchitis dx  . DVT (deep venous thrombosis)     times 2, on coumadin chronically  . Depression     hx of SI  . Substance abuse     crack, cocaine, last use 2007  . Tobacco abuse   . GERD (gastroesophageal reflux disease)   .  DVT (deep venous thrombosis)   . Bronchitis    Past Surgical History  Procedure Date  . Skin graft    Family history: pt denies family history of DM, CAD, HTN, cancer, or other significant meditation.      Social history: Pt lives in [place] with his girlfriend.  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  He is a current smoker and smokes 1/4-1/2 ppd for 22 years. He denies EtOH or illict drug use.   Review of Systems: Pertinent items are noted in HPI.  Physical Exam: Blood  pressure 127/83, pulse 83, temperature 97.7 F (36.5 C), temperature source Oral, resp. rate 20, SpO2 97.00%. GEN: No apparent distress.  Alert and oriented x 3.  Pleasant, conversant, and cooperative to exam. HEENT: head is autraumatic and normocephalic.  Neck is supple without palpable masses or lymphadenopathy.  No JVD or carotid bruits.  Vision intact.  EOMI.  PERRLA.  Sclerae anicteric.  Conjunctivae without pallor or injection. Mucous membranes are moist.  Oropharynx is without erythema, exudates, or other abnormal lesions.  Dentition is fair. RESP:  Minimal wheezing present bilaterally with good air movement.  Prolonged expiratory phase noted.  No accessory muscle use  No  ronchi, or rubs. CARDIOVASCULAR: regular rate, normal rhythm.  Clear S1, S2, no murmurs, gallops, or rubs. ABDOMEN: soft, non-tender, non-distended.  Bowels sounds present in all quadrants and normoactive.  No palpable masses. EXT: warm and dry.  Peripheral pulses equal, intact, and +2 globally.  No clubbing or cyanosis.  No edema in bilateral lower extremities. SKIN: warm and dry with normal turgor.  No rashes or abnormal lesions observed. NEURO: CN II-XII grossly intact.  Muscle strength +5/5 in bilateral upper and lower extremities.  Sensation is grossly intact.  No focal deficit.   Lab results: Coagulation:  Basename 05/26/11 1312  LABPROT 13.8  INR 1.04   Imaging results:  Dg Chest 2 View  05/26/2011  *RADIOLOGY REPORT*  Clinical Data: Shortness of breath, productive cough, and left- sided chest pain.  CHEST - 2 VIEW  Comparison: 08/14/2010  Findings: There is peribronchial thickening consistent with bronchitis but the lungs are clear of infiltrates and effusions. Heart size and vascularity are normal.  No osseous abnormality. Nipple shadows at both lung bases.  IMPRESSION: Mild bronchitic changes.  Original Report Authenticated By: Gwynn Burly, M.D.  .  Assessment & Plan by Problem: #Acute asthma  exacerbation: his symptoms of increased wheezing and sob as well his exam finding are consistent with an acute asthma exacerbation.  The frequency of his symptoms are consistent with severe persistent asthma; ideally he should be maintained on an inhaled long acting beta blocker as well as an inhaled steroid.  We will work on establishing an asthma action plan and optimizing his outpatient regimen during his stay.  Will treat him for an acute asthma exacerbation as outlined below. - Admit to regular bed - Prednisone 40mg  daily with plans to taper - Albuterol nebs q6 scheduled and q6 prn - Will monitor K level and replete if indicated  #Hx of recurrent unprovoked DVT: His revised Geneva scores of 3 placing him at low probability for acute pulmonary embolus.  Initially his symptoms of shortness of breath are much more consistent with acute asthma exacerbation.  He is amenable to resuming Coumadin therapy but will need to be bridged with Lovenox until he is therapeutic. - Check PT/INR - Full dose Lovenox per pharmacy -  Coumadin per pharmacy  #Lower extremity paresthesias: Patient's symptoms of tingling in the first 3 toes each foot is of unclear etiology.  He does not have a diagnosis of diabetes and denies any symptoms suggestive of hyperglycemia.  Will evaluate for other potential causes as outlined below, - Check HIV Ab - Check TSH - Check vitamin B12 - Check A1c  Signed: Aalivia Mcgraw 05/26/2011, 4:19 PM

## 2011-05-27 DIAGNOSIS — J45901 Unspecified asthma with (acute) exacerbation: Secondary | ICD-10-CM | POA: Diagnosis present

## 2011-05-27 LAB — CBC
HCT: 47.5 % (ref 39.0–52.0)
Hemoglobin: 16.1 g/dL (ref 13.0–17.0)
RBC: 4.96 MIL/uL (ref 4.22–5.81)
WBC: 11.7 10*3/uL — ABNORMAL HIGH (ref 4.0–10.5)

## 2011-05-27 LAB — BASIC METABOLIC PANEL
BUN: 14 mg/dL (ref 6–23)
Calcium: 9.3 mg/dL (ref 8.4–10.5)
GFR calc non Af Amer: 84 mL/min — ABNORMAL LOW (ref >90)
Glucose, Bld: 92 mg/dL (ref 70–99)

## 2011-05-27 LAB — EXPECTORATED SPUTUM ASSESSMENT W GRAM STAIN, RFLX TO RESP C: Special Requests: NORMAL

## 2011-05-27 LAB — PROTIME-INR: INR: 1.15 (ref 0.00–1.49)

## 2011-05-27 LAB — HEMOGLOBIN A1C: Mean Plasma Glucose: 117 mg/dL — ABNORMAL HIGH (ref <117)

## 2011-05-27 MED ORDER — DOCUSATE SODIUM 100 MG PO CAPS
100.0000 mg | ORAL_CAPSULE | Freq: Two times a day (BID) | ORAL | Status: DC
  Administered 2011-05-27 – 2011-05-29 (×4): 100 mg via ORAL
  Filled 2011-05-27 (×5): qty 1

## 2011-05-27 MED ORDER — FLUTICASONE-SALMETEROL 250-50 MCG/DOSE IN AEPB
1.0000 | INHALATION_SPRAY | Freq: Two times a day (BID) | RESPIRATORY_TRACT | Status: DC
  Administered 2011-05-28 – 2011-05-31 (×7): 1 via RESPIRATORY_TRACT
  Filled 2011-05-27: qty 14

## 2011-05-27 MED ORDER — HYDROCOD POLST-CHLORPHEN POLST 10-8 MG/5ML PO LQCR
5.0000 mL | Freq: Every evening | ORAL | Status: DC | PRN
  Administered 2011-05-28: 5 mL via ORAL
  Filled 2011-05-27: qty 5

## 2011-05-27 MED ORDER — PREDNISONE 20 MG PO TABS
40.0000 mg | ORAL_TABLET | Freq: Every day | ORAL | Status: DC
  Filled 2011-05-27: qty 2

## 2011-05-27 MED ORDER — METHYLPREDNISOLONE SODIUM SUCC 125 MG IJ SOLR
125.0000 mg | Freq: Four times a day (QID) | INTRAMUSCULAR | Status: AC
  Administered 2011-05-27 – 2011-05-28 (×4): 125 mg via INTRAVENOUS
  Filled 2011-05-27 (×3): qty 2

## 2011-05-27 MED ORDER — GUAIFENESIN-CODEINE 100-10 MG/5ML PO SOLN
5.0000 mL | ORAL | Status: DC | PRN
  Administered 2011-05-27 – 2011-05-28 (×3): 5 mL via ORAL
  Filled 2011-05-27 (×3): qty 5

## 2011-05-27 MED ORDER — ENOXAPARIN SODIUM 120 MG/0.8ML ~~LOC~~ SOLN
105.0000 mg | Freq: Two times a day (BID) | SUBCUTANEOUS | Status: DC
  Administered 2011-05-27 – 2011-05-30 (×6): 105 mg via SUBCUTANEOUS
  Filled 2011-05-27 (×8): qty 0.8

## 2011-05-27 MED ORDER — WARFARIN SODIUM 7.5 MG PO TABS
15.0000 mg | ORAL_TABLET | Freq: Once | ORAL | Status: AC
  Administered 2011-05-27: 15 mg via ORAL
  Filled 2011-05-27: qty 2

## 2011-05-27 MED ORDER — HYDROCOD POLST-CHLORPHEN POLST 10-8 MG/5ML PO LQCR
5.0000 mL | Freq: Once | ORAL | Status: AC
  Administered 2011-05-27: 5 mL via ORAL
  Filled 2011-05-27: qty 5

## 2011-05-27 NOTE — Progress Notes (Signed)
ANTICOAGULATION CONSULT NOTE - Follow Up Consult  Pharmacy Consult for warfarin Indication: DVT  No Known Allergies  Patient Measurements: Stated weight of 224lb (101.8kg)  Vital Signs: Temp: 96.6 F (35.9 C) (11/17 0607) Temp src: Oral (11/17 0607) BP: 109/69 mmHg (11/17 0607) Pulse Rate: 75  (11/17 0607)  Labs:  Basename 05/27/11 0630 05/26/11 1647 05/26/11 1312  HGB 16.1 17.4* --  HCT 47.5 49.7 --  PLT 209 216 --  APTT -- -- --  LABPROT 14.9 -- 13.8  INR 1.15 -- 1.04  HEPARINUNFRC -- -- --  CREATININE 1.06 1.13 --  CKTOTAL -- -- --  CKMB -- -- --  TROPONINI -- -- --   The CrCl is unknown because both a height and weight (above a minimum accepted value) are required for this calculation.  Medical History: Past Medical History  Diagnosis Date  . Asthma     hospitalized in past, questionable hx of bronchitis dx  . DVT (deep venous thrombosis)     times 2, on coumadin chronically  . Depression     hx of SI  . Substance abuse     crack, cocaine, last use 2007  . Tobacco abuse   . GERD (gastroesophageal reflux disease)   . DVT (deep venous thrombosis)   . Bronchitis    Assessment: 45 yo M with h/o DVT. Pt on Coumadin / Lovenox. INR is below-goal.   Goal of Therapy:  INR 2-3   Plan:  1. Coumadin 15 mg PO x 1 2. Lovenox 105 mg SQ Q12H. 3. F/u AM INR.  Emeline Gins 05/27/2011,11:23 AM

## 2011-05-27 NOTE — Progress Notes (Signed)
Subjective: Mr. Beckstead states he is still feeling very tight today. His breathing is improved after he has the albuterol however overall he still feels very tight. He states that he had a lot of coughing last night and only tussionex helped him get to sleep. Denies any fevers or chills. Denies any headache, nausea, vomiting, chest pain abdominal pain or leg pain. He states that his right leg is still chronically swollen to unchanged from baseline. States has not had a bowel movement in 2 days  Objective: Vital signs in last 24 hours: Filed Vitals:   05/26/11 2113 05/27/11 0142 05/27/11 0607 05/27/11 1334  BP: 125/79  109/69 116/76  Pulse: 91  75 70  Temp: 97.9 F (36.6 C)  96.6 F (35.9 C) 97.3 F (36.3 C)  TempSrc: Oral  Oral Oral  Resp: 18  16 18   Height:    6\' 4"  (1.93 m)  Weight:    230 lb 6.4 oz (104.509 kg)  SpO2: 98% 98%  97%   Weight change:  No intake or output data in the 24 hours ending 05/27/11 1614 Physical Exam:  General: NAD sitting on side of hospital in HEENT: PERRL, EOMI, no scleral icterus Cardiac: RRR, no rubs, murmurs or gallops Pulm: Diminished inspiratory capacity. No accessory muscle use. Diffuse expiratory wheezes throughout lung fields. Abd: soft, nontender, nondistended, BS present Ext: warm and well perfused, the right lower extremities is noticeably more swollen than the left however does not have pitting edema. Neuro: alert and oriented X3, cranial nerves II-XII grossly intact, no focal neurologic deficit Lab Results: Basic Metabolic Panel:  Lab 05/27/11 1191 05/26/11 1647  NA 141 139  K 4.4 4.9  CL 102 102  CO2 28 27  GLUCOSE 92 116*  BUN 14 13  CREATININE 1.06 1.13  CALCIUM 9.3 10.0  MG -- --  PHOS -- --   Liver Function Tests:  Lab 05/26/11 1647  AST 19  ALT 24  ALKPHOS 104  BILITOT 0.3  PROT 7.8  ALBUMIN 4.0   CBC:  Lab 05/27/11 0630 05/26/11 1647  WBC 11.7* 6.1  NEUTROABS -- 4.8  HGB 16.1 17.4*  HCT 47.5 49.7  MCV 95.8  94.8  PLT 209 216   Hemoglobin A1C:  Lab 05/26/11 1911  HGBA1C 5.7*   Thyroid Function Tests:  Lab 05/26/11 1647  TSH 0.306*  T4TOTAL --  FREET4 --  T3FREE --  THYROIDAB --   Coagulation:  Lab 05/27/11 0630 05/26/11 1312  LABPROT 14.9 13.8  INR 1.15 1.04   Anemia Panel:  Lab 05/26/11 1647  VITAMINB12 508  FOLATE --  FERRITIN --  TIBC --  IRON --  RETICCTPCT --   Micro Results: 2 sputum cultures inadequate collection.  Studies/Results: Dg Chest 2 View  05/26/2011  *RADIOLOGY REPORT*  Clinical Data: Shortness of breath, productive cough, and left- sided chest pain.  CHEST - 2 VIEW  Comparison: 08/14/2010  Findings: There is peribronchial thickening consistent with bronchitis but the lungs are clear of infiltrates and effusions. Heart size and vascularity are normal.  No osseous abnormality. Nipple shadows at both lung bases.  IMPRESSION: Mild bronchitic changes.  Original Report Authenticated By: Gwynn Burly, M.D.   Medications: I have reviewed the patient's current medications. Scheduled Meds:    . albuterol  2.5 mg Nebulization Q6H  . chlorpheniramine-HYDROcodone  5 mL Oral Once  . enoxaparin (LOVENOX) injection  100 mg Subcutaneous To Minor  . enoxaparin (LOVENOX) injection  105 mg Subcutaneous Q12H  .  Fluticasone-Salmeterol  1 puff Inhalation BID  . methylPREDNISolone (SOLU-MEDROL) injection  125 mg Intravenous Q6H   Followed by  . predniSONE  40 mg Oral QAC breakfast  . nicotine  14 mg Transdermal Daily  . sodium chloride  3 mL Intravenous Q12H  . warfarin  15 mg Oral ONCE-1800  . warfarin  15 mg Oral ONCE-1800  . DISCONTD: enoxaparin  1 mg/kg Subcutaneous Q12H  . DISCONTD: enoxaparin (LOVENOX) injection  100 mg Subcutaneous Q12H  . DISCONTD: predniSONE  40 mg Oral QAC breakfast   Continuous Infusions:  PRN Meds:.albuterol, guaiFENesin-codeine, DISCONTD: guaiFENesin Assessment/Plan:  1) asthma exacerbation: As Mr. Chesnut has not improved with  oral prednisone, we will start Mr. Solu-Medrol 125 mg IV every 6 hours. If he is improved in the morning we will change this to oral prednisone 40 mg by mouth daily for 2 weeks. We will continue with nebulizer treatments and start Advair again. The importance of the oral corticosteroid was again impressed upon Mr. Reetz. -- Solu-Medrol 125 mg IV every 6 hours -- Advair -- Albuterol nebs -- chlorpheniramine-HYDROcodone each bedtime for cough  2) unprovoked deep venous thromboses x2: Mr. Teixeira needs to continue anticoagulation with warfarin as he has had 2 unprovoked DVTs. This was restarted per pharmacy. His goal INR is between 2 and 3. We'll continue with Lovenox bridge.  3) tobacco abuse: Mr. Layson needs to quit smoking otherwise he will only exacerbate his asthma and add emphysema on top of this. -- smoking cessation consultation.  4) constipation: Docusate 100 mg twice a day  5) DVT prophylaxis: Lovenox or heparin.    LOS: 1 day   Lauralynn Loeb 05/27/2011, 4:14 PM

## 2011-05-27 NOTE — H&P (Signed)
Internal Medicine Attending Admission Note Date: 05/27/2011  Patient name: [name] [name] Medical record number: [id number] Date of birth: 26-Oct-1965 Age: 45 y.o. Gender: male  I saw and evaluated the patient. I reviewed the resident's note and I agree with the resident's findings and plan as documented in the resident's note with exceptions as noted below: In the resident's assessment and plan problem #1 beta blocker should read beta agonist.  Chief Complaint(s):one week of increasing shortness of breath wheezing and cough productive of gray sputum.  History - key components related to admission:[name] [name] is a 45 year old man with a history of asthma who presents with a one-week history of wheezing cough and grey colored sputum. He is noted to need to increase his albuterol usage. He admits to being out of Advair for one month. The reason for this is that he could not afford Advair. Fortunately, he just was approved for Medicaid if you days ago and should be able to afford Advair from this point forward. During this exacerbation he denies fevers shakes chills nausea vomiting diarrhea chest pain or recent sick contacts. He has been off of his Coumadin for 4 months. He is a agreeable to restarting the Coumadin. There has been no increase in his chronic right lower extremity swelling or change compared to the left leg. His symptoms were of gradual onset and progressive. As he has had worsening nocturnal cough and difficulty sleeping he decided to present to the emergency department for further evaluation and therapy.  Physical Exam - key components related to admission:  Filed Vitals:   05/26/11 2113 05/27/11 0142 05/27/11 0607 05/27/11 1334  BP: 125/79  109/69 116/76  Pulse: 91  75 70  Temp: 97.9 F (36.6 C)  96.6 F (35.9 C) 97.3 F (36.3 C)  TempSrc: Oral  Oral Oral  Resp: 18  16 18   Height:    6\' 4"  (1.93 m)  Weight:    230 lb 6.4 oz (104.509 kg)  SpO2: 98% 98%  97%   General:  Well-developed, well-nourished, man lying comfortably in bed in no acute distress. Lungs: Diffuse expiratory wheezing with prolonged expiratory phase and no crackles or rhonchi. Heart: Regular rate and rhythm without murmurs, rubs, or gallops. Abdomen: Soft, nontender, active bowel sounds, without masses or hepatosplenomegaly. Extremities: Trace pitting edema to the mid-shin of the right lower extremity.  No edema on the left.  Lab results:   Basic Metabolic Panel:  Basename 05/27/11 0630 05/26/11 1647  NA 141 139  K 4.4 4.9  CL 102 102  CO2 28 27  GLUCOSE 92 116*  BUN 14 13  CREATININE 1.06 1.13  CALCIUM 9.3 10.0  MG -- --  PHOS -- --   Liver Function Tests:  [facility] 05/26/11 1647  AST 19  ALT 24  ALKPHOS 104  BILITOT 0.3  PROT 7.8  ALBUMIN 4.0   CBC:  Basename 05/27/11 0630 05/26/11 1647  WBC 11.7* 6.1  NEUTROABS -- 4.8  HGB 16.1 17.4*  HCT 47.5 49.7  MCV 95.8 94.8  PLT 209 216   Hemoglobin A1C:  Basename 05/26/11 1911  HGBA1C 5.7*   Thyroid Function Tests:  Basename 05/26/11 1647  TSH 0.306*  T4TOTAL --  FREET4 --  T3FREE --  THYROIDAB --   Anemia Panel:  Basename 05/26/11 1647  VITAMINB12 508  FOLATE --  FERRITIN --  TIBC --  IRON --  RETICCTPCT --   Coagulation:  Basename 05/27/11 0630 05/26/11 1312  INR 1.15 1.04  Imaging results:   Chest x-ray: No effusions, infiltrates, or masses.  Assessment & Plan by Problem:  [name] [name] is a 45 year old man with a history of asthma who presents with progressive shortness of breath, cough, and sputum productive of gray phlegm. He has required increased bronchodilator usage. Of note, he has been out of his Advair for one month and has been unable to afford it since then. It is likely that this asthma exacerbation is secondary to a lack of adequate anti-inflammatory therapy with an inhaled steroid. Now that he is on Medicaid he should be able to afford his Advair and take it on a regular  basis.  #1 asthma exacerbation: We will start [name] [name] on Solu-Medrol 125 mg IV every 6 hours. We will continue his aggressive bronchodilator therapy. We will also restart the Advair. If he improves by the morning, we will convert the Solu-Medrol to prednisone 40 mg by mouth daily for 2 weeks. We will also continue the Advair chronically. The importance of chronic Advair therapy was stressed to [name] [name]. He appeared to understand the critical nature of this therapy and chronically controlling his asthma.  #2 unprovoked deep venous thromboses x2: [name] [name] while ice for long-term anticoagulation with Coumadin given his history of 2 unprovoked deep venous thromboses. He is agreeable to restarting the Coumadin. We will begin Coumadin therapy while in the hospital.  #3 tobacco abuse: [name] [name] will be encouraged to quit smoking given his underlying asthma and the likely damage cigarettes or causing his lungs.  #4 disposition: If [name] [name] is symptomatically improved by tomorrow morning, we will discharge him home with follow-up in the outpatient clinic.

## 2011-05-28 LAB — BASIC METABOLIC PANEL
BUN: 15 mg/dL (ref 6–23)
GFR calc non Af Amer: >90 mL/min (ref >90)
Glucose, Bld: 127 mg/dL — ABNORMAL HIGH (ref 70–99)
Potassium: 4.3 mEq/L (ref 3.5–5.1)

## 2011-05-28 LAB — EXPECTORATED SPUTUM ASSESSMENT W REFEX TO RESP CULTURE

## 2011-05-28 LAB — EXPECTORATED SPUTUM ASSESSMENT W GRAM STAIN, RFLX TO RESP C

## 2011-05-28 MED ORDER — WARFARIN SODIUM 2.5 MG PO TABS
12.5000 mg | ORAL_TABLET | Freq: Once | ORAL | Status: AC
  Administered 2011-05-28: 12.5 mg via ORAL
  Filled 2011-05-28: qty 1

## 2011-05-28 MED ORDER — ACETAMINOPHEN 325 MG PO TABS
650.0000 mg | ORAL_TABLET | Freq: Four times a day (QID) | ORAL | Status: DC | PRN
  Administered 2011-05-28 – 2011-05-29 (×2): 650 mg via ORAL
  Filled 2011-05-28 (×2): qty 2

## 2011-05-28 MED ORDER — PREDNISONE 10 MG PO TABS
60.0000 mg | ORAL_TABLET | Freq: Every day | ORAL | Status: DC
  Administered 2011-05-29 – 2011-05-30 (×2): 60 mg via ORAL
  Filled 2011-05-28 (×3): qty 1

## 2011-05-28 NOTE — Progress Notes (Signed)
Subjective: Breathing feels better today.  He is still coughing and bringing up white sputum.  He states he was able to sleep with the cough syrup overnight.  Has been up walking around the room and gets mildly short of breath.  Appetite is good, no BM since admission yet.   Objective: Vital signs in last 24 hours: Filed Vitals:   05/27/11 2315 05/28/11 0209 05/28/11 0506 05/28/11 0822  BP: 115/62  121/70   Pulse: 83  80   Temp: 97 F (36.1 C)  97 F (36.1 C)   TempSrc: Oral  Oral   Resp: 18  18   Height:      Weight:      SpO2: 95% 98% 92% 96%   Weight change:  No intake or output data in the 24 hours ending 05/28/11 1449 Physical Exam: Vitals reviewed. General: resting in bed, NAD HEENT: PERRL, EOMI, no scleral icterus Cardiac: RRR, no rubs, murmurs or gallops Pulm: mild expiratory wheezes in the bases bilaterally. no rales, or rhonchi Abd: soft, nontender, nondistended, BS present Ext: warm and well perfused, no pedal edema Neuro: alert and oriented X3, cranial nerves II-XII grossly intact, strength and sensation to light touch equal in bilateral upper and lower extremities  Lab Results: Basic Metabolic Panel:  Lab 05/28/11 5621 05/27/11 0630  NA 139 141  K 4.3 4.4  CL 102 102  CO2 25 28  GLUCOSE 127* 92  BUN 15 14  CREATININE 0.95 1.06  CALCIUM 9.8 9.3  MG -- --  PHOS -- --   Liver Function Tests:  Lab 05/26/11 1647  AST 19  ALT 24  ALKPHOS 104  BILITOT 0.3  PROT 7.8  ALBUMIN 4.0   CBC:  Lab 05/27/11 0630 05/26/11 1647  WBC 11.7* 6.1  NEUTROABS -- 4.8  HGB 16.1 17.4*  HCT 47.5 49.7  MCV 95.8 94.8  PLT 209 216   Hemoglobin A1C:  Lab 05/26/11 1911  HGBA1C 5.7*   Thyroid Function Tests:  Lab 05/26/11 1647  TSH 0.306*  T4TOTAL --  FREET4 --  T3FREE --  THYROIDAB --   Coagulation:  Lab 05/28/11 0715 05/27/11 0630 05/26/11 1312  LABPROT 19.1* 14.9 13.8  INR 1.57* 1.15 1.04   Anemia Panel:  Lab 05/26/11 1647  VITAMINB12 508  FOLATE  --  FERRITIN --  TIBC --  IRON --  RETICCTPCT --   Micro Results: Recent Results (from the past 240 hour(s))  CULTURE, SPUTUM-ASSESSMENT     Status: Normal   Collection Time   05/26/11  6:32 PM      Component Value Range Status Comment   Specimen Description SPUTUM   Final    Special Requests NONE   Final    Sputum evaluation     Final    Value: MICROSCOPIC FINDINGS SUGGEST THAT THIS SPECIMEN IS NOT REPRESENTATIVE OF LOWER RESPIRATORY SECRETIONS. PLEASE RECOLLECT.     CALLED TO RN S.JAMISON AT 0003 BY L.PITT 05/27/11.   Report Status 05/27/2011 FINAL   Final   CULTURE, SPUTUM-ASSESSMENT     Status: Normal   Collection Time   05/27/11  1:25 AM      Component Value Range Status Comment   Specimen Description SPUTUM   Final    Special Requests Normal   Final    Sputum evaluation     Final    Value: MICROSCOPIC FINDINGS SUGGEST THAT THIS SPECIMEN IS NOT REPRESENTATIVE OF LOWER RESPIRATORY SECRETIONS. PLEASE RECOLLECT.     CALLED TO S.JAMISON,RN  0222 05/28/11 M.CAMPBELL   Report Status 05/27/2011 FINAL   Final   CULTURE, SPUTUM-ASSESSMENT     Status: Normal   Collection Time   05/27/11 10:21 PM      Component Value Range Status Comment   Specimen Description SPUTUM   Final    Special Requests NONE   Final    Sputum evaluation     Final    Value: THIS SPECIMEN IS ACCEPTABLE. RESPIRATORY CULTURE REPORT TO FOLLOW.   Report Status 05/28/2011 FINAL   Final   CULTURE, RESPIRATORY     Status: Normal (Preliminary result)   Collection Time   05/27/11 10:21 PM      Component Value Range Status Comment   Specimen Description SPUTUM   Final    Special Requests NONE   Final    Gram Stain PENDING   Incomplete    Culture Culture reincubated for better growth   Final    Report Status PENDING   Incomplete    Studies/Results: No results found. Medications: I have reviewed the patient's current medications. Scheduled Meds:   . albuterol  2.5 mg Nebulization Q6H  . docusate sodium  100 mg  Oral BID  . enoxaparin (LOVENOX) injection  105 mg Subcutaneous Q12H  . Fluticasone-Salmeterol  1 puff Inhalation BID  . methylPREDNISolone (SOLU-MEDROL) injection  125 mg Intravenous Q6H   Followed by  . predniSONE  60 mg Oral QAC breakfast  . nicotine  14 mg Transdermal Daily  . sodium chloride  3 mL Intravenous Q12H  . warfarin  12.5 mg Oral ONCE-1800  . warfarin  15 mg Oral ONCE-1800  . DISCONTD: predniSONE  40 mg Oral QAC breakfast   Continuous Infusions:  PRN Meds:.acetaminophen, albuterol, chlorpheniramine-HYDROcodone, guaiFENesin-codeine Assessment/Plan: 1) asthma exacerbation: As Mr. Huss is better today after the 24 hours of solumedrol.  Plan to switch to Prednisone 60 mg tomorrow.  We will continue his nebulizers and I stressed the importance again of using his Advair everyday.  -- Prednisone tomorrow  -- Advair  -- Albuterol nebs  -- chlorpheniramine-HYDROcodone each bedtime for cough   2) unprovoked deep venous thromboses x2: Mr. Chiaramonte needs to continue anticoagulation with warfarin as he has had 2 unprovoked DVTs. This was restarted per pharmacy. His goal INR is between 2 and 3. We'll continue with Lovenox bridge. INR 1.57 today.  3) tobacco abuse: Mr. Eckard needs to quit smoking otherwise he will only exacerbate his asthma and add emphysema on top of this.  -- smoking cessation consultation.   4) constipation: Docusate 100 mg twice a day   5) DVT prophylaxis: Lovenox    LOS: 2 days   Daisa Stennis 05/28/2011, 2:49 PM

## 2011-05-28 NOTE — Progress Notes (Signed)
ANTICOAGULATION CONSULT NOTE - Follow Up Consult  Pharmacy Consult for warfarin Indication: DVT  No Known Allergies  Patient Measurements: Stated weight of 224lb (101.8kg)  Vital Signs: Temp: 97 F (36.1 C) (11/18 0506) Temp src: Oral (11/18 0506) BP: 121/70 mmHg (11/18 0506) Pulse Rate: 80  (11/18 0506)  Labs:  Basename 05/28/11 0715 05/27/11 0630 05/26/11 1647 05/26/11 1312  HGB -- 16.1 17.4* --  HCT -- 47.5 49.7 --  PLT -- 209 216 --  APTT -- -- -- --  LABPROT 19.1* 14.9 -- 13.8  INR 1.57* 1.15 -- 1.04  HEPARINUNFRC -- -- -- --  CREATININE 0.95 1.06 1.13 --  CKTOTAL -- -- -- --  CKMB -- -- -- --  TROPONINI -- -- -- --   Estimated Creatinine Clearance: 131.8 ml/min (by C-G formula based on Cr of 0.95).  Medical History: Past Medical History  Diagnosis Date  . Asthma     hospitalized in past, questionable hx of bronchitis dx  . DVT (deep venous thrombosis)     times 2, on coumadin chronically  . Depression     hx of SI  . Substance abuse     crack, cocaine, last use 2007  . Tobacco abuse   . GERD (gastroesophageal reflux disease)   . DVT (deep venous thrombosis)   . Bronchitis    Assessment: 45 yo M with h/o DVT. Pt on Coumadin / Lovenox. INR is below-goal.   Goal of Therapy:  INR 2-3   Plan:  1. Coumadin 12.5 mg PO x 1 2. Lovenox 105 mg SQ Q12H. 3. F/u AM INR.  Thad Ranger, Mellody Drown 05/28/2011,10:52 AM

## 2011-05-29 ENCOUNTER — Inpatient Hospital Stay (HOSPITAL_COMMUNITY): Payer: Medicaid - Out of State

## 2011-05-29 DIAGNOSIS — J45901 Unspecified asthma with (acute) exacerbation: Secondary | ICD-10-CM

## 2011-05-29 LAB — CULTURE, RESPIRATORY W GRAM STAIN

## 2011-05-29 LAB — PROTIME-INR: Prothrombin Time: 22.5 s — ABNORMAL HIGH (ref 11.6–15.2)

## 2011-05-29 LAB — EXPECTORATED SPUTUM ASSESSMENT W GRAM STAIN, RFLX TO RESP C

## 2011-05-29 LAB — EXPECTORATED SPUTUM ASSESSMENT W REFEX TO RESP CULTURE

## 2011-05-29 MED ORDER — PANTOPRAZOLE SODIUM 40 MG PO TBEC
40.0000 mg | DELAYED_RELEASE_TABLET | Freq: Every day | ORAL | Status: DC
  Administered 2011-05-29 – 2011-05-31 (×3): 40 mg via ORAL
  Filled 2011-05-29 (×3): qty 1

## 2011-05-29 MED ORDER — TRAMADOL HCL 50 MG PO TABS
50.0000 mg | ORAL_TABLET | Freq: Four times a day (QID) | ORAL | Status: DC
  Administered 2011-05-29 – 2011-05-31 (×9): 50 mg via ORAL
  Filled 2011-05-29 (×12): qty 1

## 2011-05-29 MED ORDER — IPRATROPIUM BROMIDE 0.02 % IN SOLN
0.5000 mg | Freq: Four times a day (QID) | RESPIRATORY_TRACT | Status: DC | PRN
Start: 2011-05-29 — End: 2011-05-31

## 2011-05-29 MED ORDER — WARFARIN SODIUM 10 MG PO TABS
10.0000 mg | ORAL_TABLET | Freq: Once | ORAL | Status: AC
  Administered 2011-05-29: 10 mg via ORAL
  Filled 2011-05-29: qty 1

## 2011-05-29 NOTE — Progress Notes (Signed)
ANTICOAGULATION CONSULT NOTE - Follow Up Consult  Pharmacy Consult for Coumadin/Lovenox Indication: history of DVT x2  No Known Allergies  Patient Measurements: Height: 6\' 4"  (193 cm) Weight: 230 lb 6.4 oz (104.509 kg) IBW/kg (Calculated) : 86.8   Vital Signs: Temp: 97.7 F (36.5 C) (11/19 0610) Temp src: Oral (11/19 0610) BP: 116/77 mmHg (11/19 0610) Pulse Rate: 82  (11/19 0610)  Labs:  Basename 05/29/11 0725 05/28/11 0715 05/27/11 0630 05/26/11 1647  HGB -- -- 16.1 17.4*  HCT -- -- 47.5 49.7  PLT -- -- 209 216  APTT -- -- -- --  LABPROT 22.5* 19.1* 14.9 --  INR 1.94* 1.57* 1.15 --  HEPARINUNFRC -- -- -- --  CREATININE -- 0.95 1.06 1.13  CKTOTAL -- -- -- --  CKMB -- -- -- --  TROPONINI -- -- -- --   Estimated Creatinine Clearance: 131.8 ml/min (by C-G formula based on Cr of 0.95).   Medications:  Scheduled:    . albuterol  2.5 mg Nebulization Q6H  . docusate sodium  100 mg Oral BID  . enoxaparin (LOVENOX) injection  105 mg Subcutaneous Q12H  . Fluticasone-Salmeterol  1 puff Inhalation BID  . methylPREDNISolone (SOLU-MEDROL) injection  125 mg Intravenous Q6H   Followed by  . predniSONE  60 mg Oral QAC breakfast  . nicotine  14 mg Transdermal Daily  . sodium chloride  3 mL Intravenous Q12H  . warfarin  12.5 mg Oral ONCE-1800  . DISCONTD: predniSONE  40 mg Oral QAC breakfast    Assessment: 44 YOM on coumadin for h/o unprovoked DVT x2.  It appears he stopped taking coumadin on own.  On lovenox bridge to warfarin.  INR is 1.94 today.    Goal of Therapy:  INR 2-3   Plan:  1. Coumadin 10mg  po x 1 tonight 2. INR in am 3. Lovenox remains appropriate for wt and CrCl.  Dannielle Huh 05/29/2011,8:29 AM

## 2011-05-29 NOTE — Progress Notes (Signed)
Subjective: Shortness of breath is unimproved since last I saw him. States cough is increased during the day and is productive of thick sputum.  He was only able to sleep 4 hours overnight. Had a bowel movement this morning x3. States has some acid reflux pain. Denies fevers, chills, nausea, vomiting, chest pain, and abdominal pain other than GERD.  Objective: Vital signs in last 24 hours: Filed Vitals:   05/29/11 0207 05/29/11 0610 05/29/11 0841 05/29/11 0850  BP:  116/77    Pulse: 85 82    Temp:  97.7 F (36.5 C)    TempSrc:  Oral    Resp: 19 18    Height:      Weight:      SpO2: 95% 95% 99% 99%   Weight change:   Intake/Output Summary (Last 24 hours) at 05/29/11 1145 Last data filed at 05/29/11 0500  Gross per 24 hour  Intake    360 ml  Output      0 ml  Net    360 ml   Physical Exam: Vitals reviewed. General: Sitting in bed, NAD HEENT: PERRL, EOMI, no scleral icterus Cardiac: RRR, no rubs, murmurs or gallops Pulm: Very coarse rhonchorous breath sounds throughout left greater than right. Mild tarry wheeze throughout. Abd: soft, nontender, nondistended, BS present Ext: warm and well perfused, right leg is slightly edematous though this is nonpitting. Neuro: alert and oriented X3, cranial nerves II-XII grossly intact, no focal neurologic deficits  Lab Results: Basic Metabolic Panel:  Lab 05/28/11 4098 05/27/11 0630  NA 139 141  K 4.3 4.4  CL 102 102  CO2 25 28  GLUCOSE 127* 92  BUN 15 14  CREATININE 0.95 1.06  CALCIUM 9.8 9.3  MG -- --  PHOS -- --   Liver Function Tests:  Lab 05/26/11 1647  AST 19  ALT 24  ALKPHOS 104  BILITOT 0.3  PROT 7.8  ALBUMIN 4.0   CBC:  Lab 05/27/11 0630 05/26/11 1647  WBC 11.7* 6.1  NEUTROABS -- 4.8  HGB 16.1 17.4*  HCT 47.5 49.7  MCV 95.8 94.8  PLT 209 216   Hemoglobin A1C:  Lab 05/26/11 1911  HGBA1C 5.7*   Thyroid Function Tests:  Lab 05/26/11 1647  TSH 0.306*  T4TOTAL --  FREET4 --  T3FREE --  THYROIDAB  --   Coagulation:  Lab 05/29/11 0725 05/28/11 0715 05/27/11 0630 05/26/11 1312  LABPROT 22.5* 19.1* 14.9 13.8  INR 1.94* 1.57* 1.15 1.04   Anemia Panel:  Lab 05/26/11 1647  VITAMINB12 508  FOLATE --  FERRITIN --  TIBC --  IRON --  RETICCTPCT --   Micro Results: Recent Results (from the past 240 hour(s))  CULTURE, SPUTUM-ASSESSMENT     Status: Normal   Collection Time   05/26/11  6:32 PM      Component Value Range Status Comment   Specimen Description SPUTUM   Final    Special Requests NONE   Final    Sputum evaluation     Final    Value: MICROSCOPIC FINDINGS SUGGEST THAT THIS SPECIMEN IS NOT REPRESENTATIVE OF LOWER RESPIRATORY SECRETIONS. PLEASE RECOLLECT.     CALLED TO RN S.JAMISON AT 0003 BY L.PITT 05/27/11.   Report Status 05/27/2011 FINAL   Final   CULTURE, SPUTUM-ASSESSMENT     Status: Normal   Collection Time   05/27/11  1:25 AM      Component Value Range Status Comment   Specimen Description SPUTUM   Final  Special Requests Normal   Final    Sputum evaluation     Final    Value: MICROSCOPIC FINDINGS SUGGEST THAT THIS SPECIMEN IS NOT REPRESENTATIVE OF LOWER RESPIRATORY SECRETIONS. PLEASE RECOLLECT.     CALLED TO S.JAMISON,RN 0222 05/28/11 M.CAMPBELL   Report Status 05/27/2011 FINAL   Final   CULTURE, SPUTUM-ASSESSMENT     Status: Normal   Collection Time   05/27/11 10:21 PM      Component Value Range Status Comment   Specimen Description SPUTUM   Final    Special Requests NONE   Final    Sputum evaluation     Final    Value: THIS SPECIMEN IS ACCEPTABLE. RESPIRATORY CULTURE REPORT TO FOLLOW.   Report Status 05/28/2011 FINAL   Final   CULTURE, RESPIRATORY     Status: Normal (Preliminary result)   Collection Time   05/27/11 10:21 PM      Component Value Range Status Comment   Specimen Description SPUTUM   Final    Special Requests NONE   Final    Gram Stain PENDING   Incomplete    Culture     Final    Value: NORMAL OROPHARYNGEAL FLORA     Note: FINAL  WITH GS   Report Status PENDING   Incomplete    Studies/Results: No results found. Medications: I have reviewed the patient's current medications. Scheduled Meds:    . albuterol  2.5 mg Nebulization Q6H  . docusate sodium  100 mg Oral BID  . enoxaparin (LOVENOX) injection  105 mg Subcutaneous Q12H  . Fluticasone-Salmeterol  1 puff Inhalation BID  . nicotine  14 mg Transdermal Daily  . pantoprazole  40 mg Oral Q1200  . predniSONE  60 mg Oral QAC breakfast  . sodium chloride  3 mL Intravenous Q12H  . traMADol  50 mg Oral QID  . warfarin  10 mg Oral ONCE-1800  . warfarin  12.5 mg Oral ONCE-1800  . DISCONTD: predniSONE  40 mg Oral QAC breakfast   Continuous Infusions:  PRN Meds:.acetaminophen, albuterol, chlorpheniramine-HYDROcodone, guaiFENesin-codeine Assessment/Plan: 1) asthma exacerbation: Mr. Doane has had a decline in his clinical status, with increased cough and shortness of breath today. I will reorder sputum culture and obtain chest x-ray. I'm concerned that he may have bronchitis. GERD may also be exacerbating cough. -- Prednisone 60 mg this morning -- Advair  -- Albuterol nebs  -- Low-dose tramadol for cough suppression every 6 hours -- Start PPI for GERD -- chlorpheniramine-HYDROcodone each bedtime for cough   2) unprovoked deep venous thromboses x2: Mr. Hobin needs to continue anticoagulation with warfarin as he has had 2 unprovoked DVTs. This was restarted per pharmacy. His goal INR is between 2 and 3. We'll continue with Lovenox bridge. INR 1.57 today. Will need further 2 days of Lovenox.   3) tobacco abuse: Mr. Vasil needs to quit smoking otherwise he will only exacerbate his asthma and add emphysema on top of this.  -- smoking cessation consultation.   4) constipation: Resolved. Though with addition of tramadol may need further stool softeners. -- Docusate 100 mg twice a day   5) DVT prophylaxis: Lovenox    LOS: 3 days   Latrel Szymczak 05/29/2011, 11:45  AM

## 2011-05-30 DIAGNOSIS — J45901 Unspecified asthma with (acute) exacerbation: Secondary | ICD-10-CM

## 2011-05-30 LAB — EXPECTORATED SPUTUM ASSESSMENT W GRAM STAIN, RFLX TO RESP C

## 2011-05-30 LAB — CBC
MCH: 32.5 pg (ref 26.0–34.0)
MCHC: 33.8 g/dL (ref 30.0–36.0)
Platelets: 196 10*3/uL (ref 150–400)
RBC: 4.93 MIL/uL (ref 4.22–5.81)

## 2011-05-30 LAB — PROTIME-INR: Prothrombin Time: 21.1 s — ABNORMAL HIGH (ref 11.6–15.2)

## 2011-05-30 MED ORDER — ENOXAPARIN SODIUM 100 MG/ML ~~LOC~~ SOLN
100.0000 mg | Freq: Two times a day (BID) | SUBCUTANEOUS | Status: DC
  Administered 2011-05-30 – 2011-05-31 (×2): 100 mg via SUBCUTANEOUS
  Filled 2011-05-30 (×4): qty 1

## 2011-05-30 MED ORDER — DOXYCYCLINE HYCLATE 100 MG PO TABS
100.0000 mg | ORAL_TABLET | Freq: Two times a day (BID) | ORAL | Status: DC
  Administered 2011-05-30 – 2011-05-31 (×2): 100 mg via ORAL
  Filled 2011-05-30 (×5): qty 1

## 2011-05-30 MED ORDER — WARFARIN SODIUM 7.5 MG PO TABS
15.0000 mg | ORAL_TABLET | Freq: Once | ORAL | Status: AC
  Administered 2011-05-30: 15 mg via ORAL
  Filled 2011-05-30: qty 2

## 2011-05-30 MED ORDER — PREDNISONE 20 MG PO TABS
40.0000 mg | ORAL_TABLET | Freq: Every day | ORAL | Status: DC
  Administered 2011-05-31: 40 mg via ORAL
  Filled 2011-05-30 (×2): qty 2

## 2011-05-30 NOTE — Progress Notes (Signed)
Internal Medicine Teaching Service Attending Note Date: 05/30/2011  Patient name: BENJAMIM HARNISH  Medical record number: [id number]  Date of birth: Aug 20, 1965    This patient has been seen and discussed with the house staff. Please see their note for complete details. I concur with their findings.  Mr Gettis has been making slow improvement. He has been dx with both COPD and asthma and has a tobacco hx. He has had PFT's but we do not have access to those records. We decided to add an ABX to his regimen since his improvement is slow and he reportedly has a h/o COPD. He now has medicaid and will be able to get Advair as an outpt.   D/C today or tomorrow.   Britnie Colville 05/30/2011, 4:56 PM

## 2011-05-30 NOTE — Progress Notes (Signed)
Subjective: He states his breathing is somewhat improved today. He was able to sleep all night without difficulty. States that he is only coughing a moderate amount during the day. Feels like the albuterol and Atrovent nebs are working. He has gotten 3 doses of Advair. Denies fevers, chills, nausea, vomiting, chest pain, and abdominal pain other than GERD. States had a mild headache this morning it is relieved with Tylenol. He reports 3 bowel movements yesterday.  Objective: Vital signs in last 24 hours: Filed Vitals:   05/29/11 2041 05/29/11 2100 05/30/11 0205 05/30/11 0632  BP:  109/61  115/75  Pulse: 74 63 65 70  Temp:  97.4 F (36.3 C)  97 F (36.1 C)  TempSrc:  Oral  Oral  Resp: 18 18 18 18   Height:      Weight:      SpO2:  100% 93% 96%   Weight change:   Intake/Output Summary (Last 24 hours) at 05/30/11 0829 Last data filed at 05/29/11 1300  Gross per 24 hour  Intake    400 ml  Output      0 ml  Net    400 ml   Physical Exam:  General: Sitting in bed, NAD HEENT: PERRL, EOMI, no scleral icterus.  Posterior oropharynx is slightly erythematous. No exudates appreciated. Cardiac: RRR, no rubs, murmurs or gallops Pulm: End expiratory wheezes present throughout. Coarse rhonchorous breath sounds in the lower left lung field. Otherwise clear to auscultation. Abd: soft, nontender, nondistended, BS present Ext: warm and well perfused, right leg is slightly edematous though this is nonpitting. Neuro: alert and oriented X3, cranial nerves II-XII grossly intact, no focal neurologic deficits  Lab Results: Basic Metabolic Panel:  Lab 05/28/11 4540 05/27/11 0630  NA 139 141  K 4.3 4.4  CL 102 102  CO2 25 28  GLUCOSE 127* 92  BUN 15 14  CREATININE 0.95 1.06  CALCIUM 9.8 9.3  MG -- --  PHOS -- --   Liver Function Tests:  Lab 05/26/11 1647  AST 19  ALT 24  ALKPHOS 104  BILITOT 0.3  PROT 7.8  ALBUMIN 4.0   CBC:  Lab 05/30/11 0620 05/27/11 0630 05/26/11 1647  WBC  15.4* 11.7* --  NEUTROABS -- -- 4.8  HGB 16.0 16.1 --  HCT 47.4 47.5 --  MCV 96.1 95.8 --  PLT 196 209 --   Hemoglobin A1C:  Lab 05/26/11 1911  HGBA1C 5.7*   Thyroid Function Tests:  Lab 05/26/11 1647  TSH 0.306*  T4TOTAL --  FREET4 --  T3FREE --  THYROIDAB --   Coagulation:  Lab 05/30/11 0620 05/29/11 0725 05/28/11 0715 05/27/11 0630  LABPROT 21.1* 22.5* 19.1* 14.9  INR 1.79* 1.94* 1.57* 1.15   Anemia Panel:  Lab 05/26/11 1647  VITAMINB12 508  FOLATE --  FERRITIN --  TIBC --  IRON --  RETICCTPCT --   Micro Results: Sputum cultures have shown either normal oral flora or have been inadequate samples.  Studies/Results: Dg Chest 2 View  05/29/2011  *RADIOLOGY REPORT*  Clinical Data: Shortness of breath, asthma  CHEST - 2 VIEW  Comparison: 05/26/2011  Findings: Normal heart size, mediastinal contours, and pulmonary vascularity. Mild persistent peribronchial thickening. No segmental infiltrate, pleural effusion or pneumothorax. Question right nipple shadow. No acute osseous findings.  IMPRESSION: Mild persistent bronchitic changes. Question right nipple shadow; this is seen on the preceding study but not confirmed on earlier exams. Recommend follow-up upright PA chest radiograph with nipple markers to exclude pulmonary nodule.  Original Report Authenticated By: Lollie Marrow, M.D.   Medications: I have reviewed the patient's current medications. Scheduled Meds:    . albuterol  2.5 mg Nebulization Q6H  . docusate sodium  100 mg Oral BID  . enoxaparin (LOVENOX) injection  105 mg Subcutaneous Q12H  . Fluticasone-Salmeterol  1 puff Inhalation BID  . nicotine  14 mg Transdermal Daily  . pantoprazole  40 mg Oral Q1200  . predniSONE  60 mg Oral QAC breakfast  . sodium chloride  3 mL Intravenous Q12H  . traMADol  50 mg Oral QID  . warfarin  10 mg Oral ONCE-1800   Continuous Infusions:  PRN Meds:.acetaminophen, albuterol, chlorpheniramine-HYDROcodone,  guaiFENesin-codeine, ipratropium Assessment/Plan: 1) asthma exacerbation: [name] [name] is improved with Atrovent, tramadol, and PPI for cough suppression. He states he is able to sleep overnight and his cough is decreased. It does appear that GERD was exacerbating cough. We'll begin prednisone taper today to 40 mg tomorrow morning. -- Prednisone 60 mg this morning with taper to follow. -- Advair twice a day -- Albuterol nebs  -- Low-dose tramadol for cough suppression every 6 hours -- Start PPI for GERD -- chlorpheniramine-HYDROcodone each bedtime for cough  -- Potential discharge this afternoon if clinical status improves  2) unprovoked deep venous thromboses x2: [name] [name] needs to continue anticoagulation with warfarin as he has had 2 unprovoked DVTs. This was restarted per pharmacy. His goal INR is between 2 and 3. We'll continue with Lovenox bridge. INR 1.74 today. Will need further 2 days of Lovenox.  -- Will arrange followup in the Coumadin clinic on Monday  3) tobacco abuse: [name] [name] needs to quit smoking otherwise he will only exacerbate his asthma and add emphysema on top of this.  -- smoking cessation consultation.   4) constipation: Resolved. Though with addition of tramadol may need further stool softeners. -- Docusate 100 mg twice a day   5) DVT prophylaxis: Lovenox    LOS: 4 days   [name] [name] 05/30/2011, 8:29 AM

## 2011-05-30 NOTE — Progress Notes (Signed)
ANTICOAGULATION CONSULT NOTE - Follow Up Consult  Pharmacy Consult for Lovenox->coumadin Indication: DVT  No Known Allergies  Patient Measurements: Height: 6\' 4"  (193 cm) Weight: 230 lb 6.4 oz (104.509 kg) IBW/kg (Calculated) : 86.8    Vital Signs: Temp: 97 F (36.1 C) (11/20 0632) Temp src: Oral (11/20 0632) BP: 115/75 mmHg (11/20 0632) Pulse Rate: 70  (11/20 0632)  Labs:  Basename 05/30/11 0620 05/29/11 0725 05/28/11 0715  HGB 16.0 -- --  HCT 47.4 -- --  PLT 196 -- --  APTT -- -- --  LABPROT 21.1* 22.5* 19.1*  INR 1.79* 1.94* 1.57*  HEPARINUNFRC -- -- --  CREATININE -- -- 0.95  CKTOTAL -- -- --  CKMB -- -- --  TROPONINI -- -- --   Estimated Creatinine Clearance: 131.8 ml/min (by C-G formula based on Cr of 0.95).   Medications:  Scheduled:     . albuterol  2.5 mg Nebulization Q6H  . docusate sodium  100 mg Oral BID  . enoxaparin (LOVENOX) injection  100 mg Subcutaneous Q12H  . Fluticasone-Salmeterol  1 puff Inhalation BID  . nicotine  14 mg Transdermal Daily  . pantoprazole  40 mg Oral Q1200  . predniSONE  40 mg Oral QAC breakfast  . sodium chloride  3 mL Intravenous Q12H  . traMADol  50 mg Oral QID  . warfarin  10 mg Oral ONCE-1800  . warfarin  15 mg Oral ONCE-1800  . DISCONTD: enoxaparin (LOVENOX) injection  105 mg Subcutaneous Q12H  . DISCONTD: predniSONE  60 mg Oral QAC breakfast     Assessment: 44 yom with history of dvt x2, his INR dropped from yesterday and is now 1.7? No bleeding has been noted and he continues on steroid taper. Previous records indicate that he has rather large coumadin requirements between 12.5-15mg  daily.  Goal of Therapy:  INR 2-3   Plan:  Will give coumadin 15mg  tonight Lovenox 100mg  sq q12 hours  [name] [name] 05/30/2011,9:51 AM

## 2011-05-31 MED ORDER — NICOTINE 14 MG/24HR TD PT24: 1.0000 | MEDICATED_PATCH | Freq: Every day | TRANSDERMAL | Status: AC

## 2011-05-31 MED ORDER — OMEPRAZOLE 40 MG PO CPDR: 40.0000 mg | DELAYED_RELEASE_CAPSULE | Freq: Every day | ORAL | Status: DC

## 2011-05-31 MED ORDER — ENOXAPARIN SODIUM 150 MG/ML ~~LOC~~ SOLN: 1.5000 mg/kg | Freq: Two times a day (BID) | SUBCUTANEOUS | Status: DC

## 2011-05-31 MED ORDER — TRAMADOL HCL 50 MG PO TABS: 50.0000 mg | ORAL_TABLET | Freq: Four times a day (QID) | ORAL | Status: AC

## 2011-05-31 MED ORDER — PREDNISONE 10 MG PO TABS: ORAL_TABLET | ORAL | Status: DC

## 2011-05-31 MED ORDER — WARFARIN SODIUM 5 MG PO TABS: ORAL_TABLET | ORAL | Status: DC

## 2011-05-31 MED ORDER — DSS 100 MG PO CAPS: 100.0000 mg | ORAL_CAPSULE | Freq: Two times a day (BID) | ORAL | Status: AC

## 2011-05-31 MED ORDER — WARFARIN SODIUM 7.5 MG PO TABS
15.0000 mg | ORAL_TABLET | Freq: Once | ORAL | Status: DC
  Filled 2011-05-31: qty 2

## 2011-05-31 MED ORDER — DOXYCYCLINE HYCLATE 100 MG PO TABS: 100.0000 mg | ORAL_TABLET | Freq: Two times a day (BID) | ORAL | Status: AC

## 2011-05-31 NOTE — Progress Notes (Signed)
ANTICOAGULATION CONSULT NOTE - Follow Up Consult  Pharmacy Consult for Lovenox/Coumadin Indication: DVT  No Known Allergies  Vital Signs: Temp: 97 F (36.1 C) (11/21 0629) Temp src: Oral (11/21 0629) BP: 110/69 mmHg (11/21 0629) Pulse Rate: 70  (11/21 0629)  Labs INR 1.9 No cbc this am  Estimated Creatinine Clearance: 131.8 ml/min (by C-G formula based on Cr of 0.95).   Assessment: 44yom previous noncompliance with coumadin with hx dvt x 2. Currently on lovenox-coumadin bridge. Pt req'ing large amounts of coumadin in past and again in hospital. INR still subtherapeutic.No bleeding complications have been noted Will repeat high dose with expectations that he will be >2 in am. Doxy should not interfere with INR.  Goal of Therapy:  INR goal 2-3   Plan:  1.Repeat warfarin 15mg  today 2.Continue lovenox 100q12 for now 3.Expect patiet to need 15mg  5-6x/wk and 10-12.5 8-6V per week once stabilized.  [name] [name] 05/31/2011,8:57 AM

## 2011-05-31 NOTE — Progress Notes (Signed)
Wrong patient

## 2011-05-31 NOTE — Progress Notes (Signed)
Discharged to home.  Out via w/c.  Alert, resp unlabored.  No acute distress noted.  D/C instructions given.  Ebony Hail RN

## 2011-05-31 NOTE — Discharge Summary (Signed)
Internal Medicine Teaching [facility] Discharge Note  Name: [name] [name] MRN: [id number] DOB: Apr 25, 1966 45 y.o.  Date of Admission: 05/26/2011  6:31 AM Date of Discharge: 05/31/2011 Attending Physician: Blanch Media  Discharge Diagnosis: *Asthma exacerbation Long-term (current) use of anticoagulants GERD  TOBACCO ABUSE   Discharge Medications: Current Discharge Medication List    START taking these medications   Details  docusate sodium 100 MG CAPS Take 100 mg by mouth 2 (two) times daily. Qty: 20 capsule, Refills: 0    doxycycline (VIBRA-TABS) 100 MG tablet Take 1 tablet (100 mg total) by mouth every 12 (twelve) hours. Qty: 12 tablet, Refills: 0    enoxaparin (LOVENOX) 150 MG/ML injection Inject 1 mL (150 mg total) into the skin every 12 (twelve) hours. Qty: 4 mL, Refills: 0    nicotine (NICODERM CQ - DOSED IN MG/24 HOURS) 14 mg/24hr patch Place 1 patch onto the skin daily. Qty: 28 patch, Refills: 3    omeprazole (PRILOSEC) 40 MG capsule Take 1 capsule (40 mg total) by mouth daily. Qty: 30 capsule, Refills: 0    predniSONE (DELTASONE) 10 MG tablet 3 pills on Thursday and Friday, then take 2 pills on Saturday and Sunday, then take 1 pill on Monday, and Tuesday. Take with breakfast. Qty: 12 tablet, Refills: 0    traMADol (ULTRAM) 50 MG tablet Take 1 tablet (50 mg total) by mouth 4 (four) times daily. Maximum dose= 8 tablets per day Qty: 30 tablet, Refills: 0      CONTINUE these medications which have CHANGED   Details  warfarin (COUMADIN) 5 MG tablet One Month's supply. Take 2.5 tablets daily until Monday, then take as directed by Dr. Alexandria Lodge. Qty: 75 tablet, Refills: 0   Associated Diagnoses: Acute venous embolism and thrombosis of unspecified deep vessels of lower extremity; Long-term (current) use of anticoagulants      CONTINUE these medications which have NOT CHANGED   Details  albuterol (PROVENTIL) (2.5 MG/3ML) 0.083% nebulizer solution Take 3 mLs  (2.5 mg total) by nebulization 3 (three) times daily. And every 3 hours as needed for wheezing or shortness of breath. Qty: 75 mL, Refills: 11    albuterol (VENTOLIN HFA) 108 (90 BASE) MCG/ACT inhaler Inhale 2 puffs into the lungs every 6 (six) hours as needed. Qty: 1 each, Refills: 11    Fluticasone-Salmeterol (ADVAIR DISKUS) 250-50 MCG/DOSE AEPB Inhale 1 puff into the lungs 2 (two) times daily. For asthma Qty: 60 each, Refills: 11        Disposition and follow-up:   [name] [name] was discharged from [facility] in Good condition.    Follow-up Appointments:  at his followup appointment with Dr. Gilford Rile the following item should be addressed: 1) please assess her for resolution of asthma exacerbation  2) please ask about smoking cessation 3) please ask about the patient using his inhaled steroid every day  Discharge Orders    Future Appointments: Provider: Department: Dept Phone: Center:   06/05/2011 9:45 AM [facility] [phone] [facility]   06/13/2011 3:15 PM Darnelle Maffucci [facility] [phone] [facility]     Future Orders Please Complete By Expires   Diet - low sodium heart healthy      Increase activity slowly      Discharge instructions      Comments:   You must stop smoking.   Call MD for:  temperature >100.4      Call MD for:  persistant nausea and  vomiting      Call MD for:  severe uncontrolled pain      Call MD for:  difficulty breathing, headache or visual disturbances      Call MD for:  persistant dizziness or light-headedness      Call MD for:  extreme fatigue         Consultations: NONE  Procedures Performed:  Dg Chest 2 View  05/29/2011  *RADIOLOGY REPORT*  Clinical Data: Shortness of breath, asthma  CHEST - 2 VIEW  Comparison: 05/26/2011  Findings: Normal heart size, mediastinal contours, and pulmonary vascularity. Mild persistent peribronchial thickening. No segmental infiltrate, pleural effusion or  pneumothorax. Question right nipple shadow. No acute osseous findings.  IMPRESSION: Mild persistent bronchitic changes. Question right nipple shadow; this is seen on the preceding study but not confirmed on earlier exams. Recommend follow-up upright PA chest radiograph with nipple markers to exclude pulmonary nodule.  Original Report Authenticated By: Lollie Marrow, M.D.   Dg Chest 2 View  05/26/2011  *RADIOLOGY REPORT*  Clinical Data: Shortness of breath, productive cough, and left- sided chest pain.  CHEST - 2 VIEW  Comparison: 08/14/2010  Findings: There is peribronchial thickening consistent with bronchitis but the lungs are clear of infiltrates and effusions. Heart size and vascularity are normal.  No osseous abnormality. Nipple shadows at both lung bases.  IMPRESSION: Mild bronchitic changes.  Original Report Authenticated By: Gwynn Burly, M.D.    Admission HPI:    [name] [name] is a 46 year old gentleman with past medical history significant for who presents with 7 days of increasing shortness of breath, wheezing, and cough productive of grey colored phlegm. He notes he has been experiencing 2 asthma attacks per night that are increasingly difficult to control for the past 3 nights; he reports has been experiencing on average 1 asthma exacerbation per night for the past 2 months. Over the past week, he has increased his albuterol use to 6 nebulized treatments daily with at least 10 puffs from his albuterol HFA.  He states he is supposed to be taking Advair however has been out of this medication for the last month after returning to [place] from [place]. He denies fever, chills, not assessed, nausea, vomiting, diarrhea, chest pain, syncope or sick contacts.  He believes his recent attacks have been triggered by his girlfriend increasing the heat to greater than 73 at night as well as using scented aerosol sprays.   He states he is prescribed Coumadin for lifelong anticoagulation after  experiencing 2 unprovoked DVTs. He notes he stopped taking this medication 4 months ago; he denies any specific reason for stopping his Coumadin.  He denies any increased leg swelling, unilateral leg pain, or pleuritic chest pain.  Additionally, he reports recent onset of bilateral toe tingling and numbness. He notes the symptoms worsened after her walking for a prolonged period.  He denies any associated weakness or pain and reports involvement of his first 3 toes on each foot.  He has not noticed any other precipitating or worsening factors and reports the sensation and resolves with rest.   He has no other concerns or complaints and is feeling better after receiving a continuous nebulizer treatment in the emergency room as well as 60 mg of oral prednisone.  Admission Physical Exam:  Blood pressure 127/83, pulse 83, temperature 97.7 F (36.5 C), temperature source Oral, resp. rate 20, SpO2 97.00%.  GEN: No apparent distress. Alert and oriented x 3. Pleasant, conversant, and cooperative to exam.  HEENT: head is autraumatic and normocephalic. Neck is supple without palpable masses or lymphadenopathy. No JVD or carotid bruits. Vision intact. EOMI. PERRLA. Sclerae anicteric. Conjunctivae without pallor or injection. Mucous membranes are moist. Oropharynx is without erythema, exudates, or other abnormal lesions. Dentition is fair.  RESP: Minimal wheezing present bilaterally with good air movement. Prolonged expiratory phase noted. No accessory muscle use No ronchi, or rubs.  CARDIOVASCULAR: regular rate, normal rhythm. Clear S1, S2, no murmurs, gallops, or rubs.  ABDOMEN: soft, non-tender, non-distended. Bowels sounds present in all quadrants and normoactive. No palpable masses.  EXT: warm and dry. Peripheral pulses equal, intact, and +2 globally. No clubbing or cyanosis. No edema in bilateral lower extremities.  SKIN: warm and dry with normal turgor. No rashes or abnormal lesions observed.  NEURO: CN  II-XII grossly intact. Muscle strength +5/5 in bilateral upper and lower extremities. Sensation is grossly intact. No focal deficit.   Admission Lab results:  Coagulation:   Basename  05/26/11 1312   LABPROT  13.8   INR  1.04      Hospital Course by problem list: 1) Asthma exacerbation - [name] [name] was treated with IV Solu-Medrol for 2 days. He was then tapered to 60 mg prednisone for 2 days, followed by one day of 40 mg of prednisone. He also was placed on albuterol nebs every 6 hours and restarted on Advair. While he had an initial improvement in his symptoms on hospital day 3 he had increased cough and sputum production as well as persistent shortness of breath. Tramadol and PPI were started for cough suppression as the patient was endorsing epigastric pain consistent with GERD. Hospital day 4 he was started on doxycycline for 7 day course given that he had symptoms consistent with COPD exacerbation. On the date of discharge he states his cough has largely resolved. He still had shortness of breath though this was relieved with albuterol nebs which he states he can provide at home. Blood and sputum cultures were negative.  2) Long-term (current) use of anticoagulants - [name] [name] discontinue warfarin therapy even though he needs lifelong treatment for 2 separate unprovoked DVTs. He was willing to reinitiate warfarin therapy during this hospitalization. His INR was subtherapeutic at discharge. He was given 4 days of Lovenox and an appointment was made in the warfarin clinic with Dr. Alexandria Lodge on November 26.  3) GERD - [name] [name]'s GERD could potentially be causing some of his pulmonary irritation and shortness of breath in addition to abdominal pain. He was maintained on PPI while hospitalized and was discharged with a prescription for omeprazole.  4) TOBACCO ABUSE - [name] [name] absolutely needs to stop smoking. He was given extensive smoking cessation counseling during this hospitalization. He was  discharged with a prescription for nicotine patches.  Discharge Vitals:  BP 110/69  Pulse 70  Temp(Src) 97 F (36.1 C) (Oral)  Resp 20  Ht 6\' 4"  (1.93 m)  Wt 230 lb 6.4 oz (104.509 kg)  BMI 28.05 kg/m2  SpO2 98%  Discharge Labs:  Results for orders placed during the hospital encounter of 05/26/11 (from the past 24 hour(s))  PROTIME-INR     Status: Abnormal   Collection Time   05/31/11  6:35 AM      Component Value Range   Prothrombin Time 22.1 (*) 11.6 - 15.2 (seconds)   INR 1.90 (*) 0.00 - 1.49    Results for orders placed during the hospital encounter of 05/26/11 (from the past 48 hour(s))  PROTIME-INR  Status: Abnormal   Collection Time   05/30/11  6:20 AM      Component Value Range Comment   Prothrombin Time 21.1 (*) 11.6 - 15.2 (seconds)    INR 1.79 (*) 0.00 - 1.49    CBC     Status: Abnormal   Collection Time   05/30/11  6:20 AM      Component Value Range Comment   WBC 15.4 (*) 4.0 - 10.5 (K/uL)    RBC 4.93  4.22 - 5.81 (MIL/uL)    Hemoglobin 16.0  13.0 - 17.0 (g/dL)    HCT 16.1  09.6 - 04.5 (%)    MCV 96.1  78.0 - 100.0 (fL)    MCH 32.5  26.0 - 34.0 (pg)    MCHC 33.8  30.0 - 36.0 (g/dL)    RDW 40.9  81.1 - 91.4 (%)    Platelets 196  150 - 400 (K/uL)   CULTURE, SPUTUM-ASSESSMENT     Status: Normal   Collection Time   05/30/11  7:02 AM      Component Value Range Comment   Specimen Description SPUTUM      Special Requests NONE      Sputum evaluation        Value: MICROSCOPIC FINDINGS SUGGEST THAT THIS SPECIMEN IS NOT REPRESENTATIVE OF LOWER RESPIRATORY SECRETIONS. PLEASE RECOLLECT.     CALLED AND NOTIFIED BROWN,T RN 05/30/2011 0901 [place]ORDANS   Report Status 05/30/2011 FINAL     PROTIME-INR     Status: Abnormal   Collection Time   05/31/11  6:35 AM      Component Value Range Comment   Prothrombin Time 22.1 (*) 11.6 - 15.2 (seconds)    INR 1.90 (*) 0.00 - 1.49     Signed: Baylin Gamblin 05/31/2011, 2:18 PM

## 2011-05-31 NOTE — Progress Notes (Signed)
Subjective: Felling a little better today. States  He was able to sleep all night. Has a slight cough. Mostly had cough when waking up. SOB is improved. Denies fevers, chills, nausea, vomiting, chest pain, and abdominal pain. States had a mild headache this morning it is relieved with Tylenol. He reports last BM was yesterday. Has nebulizer at home and can use it.   Objective: Vital signs in last 24 hours: Filed Vitals:   05/30/11 2129 05/31/11 0241 05/31/11 0629 05/31/11 0920  BP:   110/69   Pulse:   70   Temp:   97 F (36.1 C)   TempSrc:   Oral   Resp:   20   Height:      Weight:      SpO2: 97% 96% 96% 98%   Weight change:   Intake/Output Summary (Last 24 hours) at 05/31/11 0959 Last data filed at 05/30/11 1800  Gross per 24 hour  Intake    340 ml  Output      3 ml  Net    337 ml   Physical Exam:  General: Sitting in bed, NAD HEENT: PERRL, EOMI, no scleral icterus.   Cardiac: RRR, no rubs, murmurs or gallops Pulm: End expiratory wheezes present throughout, slightly improved from yesterday. Coarse rhonchorous breath sounds in the lower left lung field improved from yesterday Otherwise clear to auscultation. Abd: soft, nontender, nondistended, BS present Ext: warm and well perfused, right leg is slightly edematous though this is nonpitting. Neuro: alert and oriented X3, cranial nerves II-XII grossly intact, no focal neurologic deficits  Lab Results: Basic Metabolic Panel:  Lab 05/28/11 1610 05/27/11 0630  NA 139 141  K 4.3 4.4  CL 102 102  CO2 25 28  GLUCOSE 127* 92  BUN 15 14  CREATININE 0.95 1.06  CALCIUM 9.8 9.3  MG -- --  PHOS -- --   Liver Function Tests:  Lab 05/26/11 1647  AST 19  ALT 24  ALKPHOS 104  BILITOT 0.3  PROT 7.8  ALBUMIN 4.0   CBC:  Lab 05/30/11 0620 05/27/11 0630 05/26/11 1647  WBC 15.4* 11.7* --  NEUTROABS -- -- 4.8  HGB 16.0 16.1 --  HCT 47.4 47.5 --  MCV 96.1 95.8 --  PLT 196 209 --   Hemoglobin A1C:  Lab 05/26/11 1911    HGBA1C 5.7*   Thyroid Function Tests:  Lab 05/26/11 1647  TSH 0.306*  T4TOTAL --  FREET4 --  T3FREE --  THYROIDAB --   Coagulation:  Lab 05/31/11 0635 05/30/11 0620 05/29/11 0725 05/28/11 0715  LABPROT 22.1* 21.1* 22.5* 19.1*  INR 1.90* 1.79* 1.94* 1.57*   Anemia Panel:  Lab 05/26/11 1647  VITAMINB12 508  FOLATE --  FERRITIN --  TIBC --  IRON --  RETICCTPCT --   Micro Results: Sputum cultures have shown either normal oral flora or have been inadequate samples.  Studies/Results: Dg Chest 2 View  05/29/2011  *RADIOLOGY REPORT*  Clinical Data: Shortness of breath, asthma  CHEST - 2 VIEW  Comparison: 05/26/2011  Findings: Normal heart size, mediastinal contours, and pulmonary vascularity. Mild persistent peribronchial thickening. No segmental infiltrate, pleural effusion or pneumothorax. Question right nipple shadow. No acute osseous findings.  IMPRESSION: Mild persistent bronchitic changes. Question right nipple shadow; this is seen on the preceding study but not confirmed on earlier exams. Recommend follow-up upright PA chest radiograph with nipple markers to exclude pulmonary nodule.  Original Report Authenticated By: Lollie Marrow, M.D.   Medications: I have  reviewed the patient's current medications. Scheduled Meds:    . albuterol  2.5 mg Nebulization Q6H  . docusate sodium  100 mg Oral BID  . doxycycline  100 mg Oral Q12H  . enoxaparin (LOVENOX) injection  100 mg Subcutaneous Q12H  . Fluticasone-Salmeterol  1 puff Inhalation BID  . nicotine  14 mg Transdermal Daily  . pantoprazole  40 mg Oral Q1200  . predniSONE  40 mg Oral QAC breakfast  . sodium chloride  3 mL Intravenous Q12H  . traMADol  50 mg Oral QID  . warfarin  15 mg Oral ONCE-1800  . warfarin  15 mg Oral ONCE-1800   Continuous Infusions:  PRN Meds:.acetaminophen, albuterol, chlorpheniramine-HYDROcodone, guaiFENesin-codeine, ipratropium Assessment/Plan: 1) asthma exacerbation: Mr. Helmers is improved  with Atrovent, tramadol, and PPI for cough suppression. He states he is able to sleep overnight and his cough is decreased. It does appear that GERD was exacerbating cough. Doxycycline was added yesterday as Mr. Forti was having persistent symptoms and cough with sputum production (day 2/7). Prednisone taper initiated -- Prednisone 40 mg this morning with 1 week taper to follow -- Advair twice a day -- Albuterol nebs  -- Low-dose tramadol for cough suppression every 6 hours -- Start PPI for GERD -- chlorpheniramine-HYDROcodone each bedtime for cough  -- D/C today  2) unprovoked deep venous thromboses x2: Mr. Behnken needs to continue anticoagulation with warfarin as he has had 2 unprovoked DVTs. This was restarted per pharmacy. His goal INR is between 2 and 3. We'll continue with Lovenox bridge. INR 1.9 today.  Lovenox.  -- Will arrange followup in the Coumadin clinic on Monday  3) tobacco abuse: Mr. Conde needs to quit smoking otherwise he will only exacerbate his asthma and add emphysema on top of this.  -- smoking cessation consultation.   4) constipation: Resolved. Though with addition of tramadol may need further stool softeners. -- Docusate 100 mg twice a day   5) DVT prophylaxis: Lovenox    LOS: 5 days   Patrici Minnis 05/31/2011, 9:59 AM

## 2011-06-03 ENCOUNTER — Encounter (HOSPITAL_COMMUNITY): Payer: Self-pay | Admitting: Emergency Medicine

## 2011-06-03 ENCOUNTER — Emergency Department (HOSPITAL_COMMUNITY)
Admission: EM | Admit: 2011-06-03 | Discharge: 2011-06-03 | Disposition: A | Discharge status: Home / Self Care | Payer: Medicaid - Out of State | Attending: Emergency Medicine | Admitting: Emergency Medicine

## 2011-06-03 ENCOUNTER — Emergency Department (HOSPITAL_COMMUNITY): Payer: Medicaid - Out of State

## 2011-06-03 DIAGNOSIS — R059 Cough, unspecified: Secondary | ICD-10-CM | POA: Insufficient documentation

## 2011-06-03 DIAGNOSIS — R0602 Shortness of breath: Secondary | ICD-10-CM | POA: Insufficient documentation

## 2011-06-03 DIAGNOSIS — Z79899 Other long term (current) drug therapy: Secondary | ICD-10-CM | POA: Insufficient documentation

## 2011-06-03 DIAGNOSIS — Z7901 Long term (current) use of anticoagulants: Secondary | ICD-10-CM | POA: Insufficient documentation

## 2011-06-03 DIAGNOSIS — Z86718 Personal history of other venous thrombosis and embolism: Secondary | ICD-10-CM | POA: Insufficient documentation

## 2011-06-03 DIAGNOSIS — J45909 Unspecified asthma, uncomplicated: Secondary | ICD-10-CM | POA: Insufficient documentation

## 2011-06-03 DIAGNOSIS — J3489 Other specified disorders of nose and nasal sinuses: Secondary | ICD-10-CM | POA: Insufficient documentation

## 2011-06-03 DIAGNOSIS — F329 Major depressive disorder, single episode, unspecified: Secondary | ICD-10-CM | POA: Insufficient documentation

## 2011-06-03 DIAGNOSIS — K219 Gastro-esophageal reflux disease without esophagitis: Secondary | ICD-10-CM | POA: Insufficient documentation

## 2011-06-03 DIAGNOSIS — R05 Cough: Secondary | ICD-10-CM | POA: Insufficient documentation

## 2011-06-03 DIAGNOSIS — F3289 Other specified depressive episodes: Secondary | ICD-10-CM | POA: Insufficient documentation

## 2011-06-03 DIAGNOSIS — R070 Pain in throat: Secondary | ICD-10-CM | POA: Insufficient documentation

## 2011-06-03 LAB — POCT I-STAT, CHEM 8
BUN: 21 mg/dL (ref 6–23)
Calcium, Ion: 1.18 mmol/L (ref 1.12–1.32)
Creatinine, Ser: 1.1 mg/dL (ref 0.50–1.35)
TCO2: 27 mmol/L (ref 0–100)

## 2011-06-03 LAB — DIFFERENTIAL
Basophils Absolute: 0.1 10*3/uL (ref 0.0–0.1)
Lymphocytes Relative: 26 % (ref 12–46)
Lymphs Abs: 3.4 10*3/uL (ref 0.7–4.0)
Monocytes Absolute: 1.1 10*3/uL — ABNORMAL HIGH (ref 0.1–1.0)
Monocytes Relative: 9 % (ref 3–12)
Neutro Abs: 8 10*3/uL — ABNORMAL HIGH (ref 1.7–7.7)

## 2011-06-03 LAB — PROTIME-INR: INR: 1.68 — ABNORMAL HIGH (ref 0.00–1.49)

## 2011-06-03 LAB — CBC
HCT: 49 % (ref 39.0–52.0)
Hemoglobin: 16.2 g/dL (ref 13.0–17.0)
WBC: 12.8 10*3/uL — ABNORMAL HIGH (ref 4.0–10.5)

## 2011-06-03 MED ORDER — ALBUTEROL (5 MG/ML) CONTINUOUS INHALATION SOLN
10.0000 mg/h | INHALATION_SOLUTION | Freq: Once | RESPIRATORY_TRACT | Status: AC
  Administered 2011-06-03: 10 mg/h via RESPIRATORY_TRACT

## 2011-06-03 MED ORDER — ALBUTEROL SULFATE (5 MG/ML) 0.5% IN NEBU
5.0000 mg | INHALATION_SOLUTION | Freq: Once | RESPIRATORY_TRACT | Status: AC
  Administered 2011-06-03: 5 mg via RESPIRATORY_TRACT
  Filled 2011-06-03: qty 1

## 2011-06-03 MED ORDER — CEFTRIAXONE SODIUM 1 G IJ SOLR
1.0000 g | Freq: Once | INTRAMUSCULAR | Status: AC
  Administered 2011-06-03: 07:00:00 via INTRAVENOUS
  Filled 2011-06-03: qty 10

## 2011-06-03 MED ORDER — HYDROCOD POLST-CHLORPHEN POLST 10-8 MG/5ML PO LQCR: 5.0000 mL | Freq: Two times a day (BID) | ORAL | Status: DC | PRN

## 2011-06-03 MED ORDER — MAGNESIUM SULFATE 40 MG/ML IJ SOLN
2.0000 g | INTRAMUSCULAR | Status: AC
  Administered 2011-06-03: 2 g via INTRAVENOUS
  Filled 2011-06-03: qty 50

## 2011-06-03 MED ORDER — TRAMADOL HCL 50 MG PO TABS
50.0000 mg | ORAL_TABLET | Freq: Once | ORAL | Status: AC
  Administered 2011-06-03: 50 mg via ORAL
  Filled 2011-06-03: qty 1

## 2011-06-03 MED ORDER — IPRATROPIUM BROMIDE 0.02 % IN SOLN
0.5000 mg | Freq: Once | RESPIRATORY_TRACT | Status: AC
  Administered 2011-06-03: 0.5 mg via RESPIRATORY_TRACT
  Filled 2011-06-03: qty 2.5

## 2011-06-03 MED ORDER — MAGNESIUM SULFATE 50 % IJ SOLN: 2.0000 g | Freq: Once | INTRAMUSCULAR | Status: DC

## 2011-06-03 NOTE — ED Notes (Signed)
PT continues to get breathing tx at this time.

## 2011-06-03 NOTE — ED Notes (Signed)
Patient was up to bathroom and is not resting comfortably, napping with family at bedside.

## 2011-06-03 NOTE — ED Notes (Signed)
First meeting patient. Patient states he developed a cough x 1 day and asthma attach started soon after coughing started.

## 2011-06-03 NOTE — ED Notes (Signed)
Patient sleeping with family at bedside.

## 2011-06-03 NOTE — ED Notes (Addendum)
Family at bedside. Patient states his nose is stuffed up and feels like his breathing is better. Patient resting with NAD at this time.

## 2011-06-03 NOTE — ED Notes (Signed)
Family at bedside. 

## 2011-06-03 NOTE — ED Provider Notes (Signed)
History     CSN: [id number] Arrival date & time: 06/03/2011  4:51 AM   First MD Initiated Contact with Patient 06/03/11 0602      Chief Complaint  Patient presents with  . Shortness of Breath    (Consider location/radiation/quality/duration/timing/severity/associated sxs/prior treatment) HPI Comments: Patient with history of asthma and DVT on chronic Coumadin therapy-presents this morning with worsening asthma symptoms and shortness of breath. The patient states he was recently hospitalized for the same. He is currently taking an antibiotic and oral prednisone. The patient uses albuterol inhaler and nebulizer at home as well as Advair. He has been using these and the symptoms still worsened overnight. He states that he sleeps better sitting up. Otherwise the patient denies fever, upper respiratory symptoms, nausea, vomiting, and diarrhea. Denies lower extremity edema. Patient states that he is taking his Coumadin as prescribed. Patient states that this feels similar to previous asthma flares. Patient denies any worsening leg swelling, unilateral leg pain, or pleuritic chest pain.  Patient is a 45 y.o. male presenting with shortness of breath. The history is provided by the patient.  Shortness of Breath  The current episode started today. The onset was gradual. The symptoms are relieved by beta-agonist inhalers. The symptoms are aggravated by a supine position. Associated symptoms include rhinorrhea, sore throat, cough, shortness of breath and wheezing. Pertinent negatives include no chest pain and no fever. His past medical history is significant for asthma.    Past Medical History  Diagnosis Date  . Asthma     hospitalized in past, questionable hx of bronchitis dx  . DVT (deep venous thrombosis)     times 2, on coumadin chronically  . Depression     hx of SI  . Substance abuse     crack, cocaine, last use 2007  . Tobacco abuse   . GERD (gastroesophageal reflux disease)   . DVT  (deep venous thrombosis)   . Bronchitis     Past Surgical History  Procedure Date  . Skin graft     No family history on file.  History  Substance Use Topics  . Smoking status: Current Everyday Smoker  . Smokeless tobacco: Not on file  . Alcohol Use: Yes     OCCASIONAL      Review of Systems  Constitutional: Negative for fever and chills.  HENT: Positive for sore throat and rhinorrhea.   Eyes: Negative for redness.  Respiratory: Positive for cough, shortness of breath and wheezing.   Cardiovascular: Negative for chest pain.  Gastrointestinal: Negative for nausea, vomiting, abdominal pain, diarrhea and constipation.  Genitourinary: Negative for dysuria.  Musculoskeletal: Negative for myalgias.  Skin: Negative for rash.  Neurological: Negative for dizziness and headaches.    Allergies  Review of patient's allergies indicates no known allergies.  Home Medications   Current Outpatient Rx  Name Route Sig Dispense Refill  . ALBUTEROL SULFATE (2.5 MG/3ML) 0.083% IN NEBU Nebulization Take 3 mLs (2.5 mg total) by nebulization 3 (three) times daily. And every 3 hours as needed for wheezing or shortness of breath. 75 mL 11  . ALBUTEROL SULFATE HFA 108 (90 BASE) MCG/ACT IN AERS Inhalation Inhale 2 puffs into the lungs every 6 (six) hours as needed. 1 each 11  . DSS 100 MG PO CAPS Oral Take 100 mg by mouth 2 (two) times daily. 20 capsule 0  . DOXYCYCLINE HYCLATE 100 MG PO TABS Oral Take 1 tablet (100 mg total) by mouth every 12 (twelve) hours. 12 tablet 0  .  ENOXAPARIN SODIUM 150 MG/ML [place] SOLN Subcutaneous Inject 1 mL (150 mg total) into the skin every 12 (twelve) hours. 4 mL 0  . FLUTICASONE-SALMETEROL 250-50 MCG/DOSE IN AEPB Inhalation Inhale 1 puff into the lungs 2 (two) times daily. For asthma 60 each 11  . NICOTINE 14 MG/24HR TD PT24 Transdermal Place 1 patch onto the skin daily. 28 patch 3  . OMEPRAZOLE 40 MG PO CPDR Oral Take 1 capsule (40 mg total) by mouth daily. 30  capsule 0  . PREDNISONE 10 MG PO TABS  3 pills on Thursday and Friday, then take 2 pills on Saturday and Sunday, then take 1 pill on Monday, and Tuesday. Take with breakfast. 12 tablet 0  . TRAMADOL HCL 50 MG PO TABS Oral Take 1 tablet (50 mg total) by mouth 4 (four) times daily. Maximum dose= 8 tablets per day 30 tablet 0  . WARFARIN SODIUM 5 MG PO TABS  One Month's supply. Take 2.5 tablets daily until Monday, then take as directed by Dr. Alexandria Lodge. 75 tablet 0    BP 126/87  Pulse 78  Temp(Src) 97.6 F (36.4 C) (Oral)  Resp 22  SpO2 100%  Physical Exam  Nursing note and vitals reviewed. Constitutional: He is oriented to person, place, and time. He appears well-developed and well-nourished.  HENT:  Head: Normocephalic and atraumatic.  Eyes: Conjunctivae are normal. Pupils are equal, round, and reactive to light. Right eye exhibits no discharge. Left eye exhibits no discharge.  Neck: Normal range of motion. Neck supple.  Cardiovascular: Normal rate, regular rhythm and normal heart sounds.   Pulmonary/Chest: Effort normal. No respiratory distress. He has wheezes.       Diffuse expiratory wheezing. Crackles in right base.  Abdominal: Soft. Bowel sounds are normal. There is no tenderness. There is no rebound and no guarding.  Musculoskeletal: He exhibits no edema.  Neurological: He is alert and oriented to person, place, and time.  Skin: Skin is warm and dry.  Psychiatric: He has a normal mood and affect.    ED Course  Procedures (including critical care time)  Labs Reviewed - No data to display Dg Chest 2 View  06/03/2011  *RADIOLOGY REPORT*  Clinical Data: Shortness of breath and cough for weeks; sore throat.  History of asthma.  CHEST - 2 VIEW  Comparison: Chest radiograph performed 05/29/2011  Findings: The lungs are well-aerated.  Mild peribronchial thickening is noted.  Minimal right basilar opacity is more apparent than on prior studies and may reflect atelectasis or mild  pneumonia.  There is no evidence of pleural effusion or pneumothorax.  The heart is normal in size; the mediastinal contour is within normal limits.  No acute osseous abnormalities are seen.  IMPRESSION:  1.  Minimal apparent right basilar airspace opacity may reflect atelectasis or mild pneumonia. 2.  Mild peribronchial thickening noted.  Original Report Authenticated By: Tonia Ghent, M.D.     No diagnosis found.  6:22 AM Patient seen and examined. Pt currently on doxycycline, oral prednisone, warfarin.  O2 sat 99% on RA. No resp distress.   7:36 AM Patient was discussed with Sunnie Nielsen, MD will give Rocephin in the emergency department due to possible pneumonia on x-ray as well as magnesium to help with bronchial constriction.  8:04 AM patient reexamined. Patient has finished albuterol nebulizer treatment and is getting antibiotics and magnesium. He states that his breathing is unchanged since arrival. Will continue to monitor.  10:34 AM patient reexamined. He is sleeping comfortably in room.  O2 sat is 99% on room air. Patient still complaining about shortness of breath and cough. He states he is feeling slightly better than when he came in. Will give another breathing treatment and reevaluate. If no improvement will consider contacting primary care doctor for consult.  11:36 AM patient reexamined after her latest breathing treatment. His wheezing is resolved. He states that he is feeling better. He is willing for discharge to home. He will continue home breathing treatments. He has follow-up for INR check in 2 days. He has recheck of asthma on 12/04. Pt informed of INR.   MDM  Patient with history of asthma and recent hospitalization for the same. He is currently taking Advair, albuterol, prednisone, and doxycycline. His breathing and wheezing is improved in the emergency department after several nebulizers and magnesium. Patient continues to have an O2 saturation in the upper 90% on room air.  The patient's INR is subtherapeutic however he denies any symptoms of PE including leg swelling, unilateral leg pain, pleuritic chest pain. Patient is not tachycardic. I have a very low suspicion for pulmonary embolism. Patient has close PCP follow up. Vital signs are stable and patient is stable for discharge home. He appears well. X-ray shows possible mild pneumonia. Patient is orally on doxycycline. The x-ray is not impressive. The patient is afebrile. Will continue on doxycycline.      Carolee Rota, [place] 06/03/11 1145  Medical screening examination/treatment/procedure(s) were performed by non-physician practitioner and as supervising physician I was immediately available for consultation/collaboration.  Sunnie Nielsen, MD 06/03/11 [phone]

## 2011-06-03 NOTE — ED Notes (Signed)
Patient transported to X-ray 

## 2011-06-03 NOTE — ED Notes (Signed)
PT. REPORTS SOB / PRODUCTIVE COUGH ONSET LAST NIGHT ,  DENIES FEVER OR CHILLS . ALSO REPORTS SORE THROAT AND RUNNY NOSE.

## 2011-06-03 NOTE — ED Notes (Signed)
Patient discharged home with family.

## 2011-06-05 ENCOUNTER — Ambulatory Visit (INDEPENDENT_AMBULATORY_CARE_PROVIDER_SITE_OTHER): Payer: Medicaid - Out of State | Admitting: Pharmacist

## 2011-06-05 DIAGNOSIS — Z7901 Long term (current) use of anticoagulants: Secondary | ICD-10-CM

## 2011-06-05 DIAGNOSIS — I82409 Acute embolism and thrombosis of unspecified deep veins of unspecified lower extremity: Secondary | ICD-10-CM

## 2011-06-05 NOTE — Progress Notes (Signed)
Anti-Coagulation Progress Note  [name] [name] is a 45 y.o. male who is currently on an anti-coagulation regimen.    RECENT RESULTS: Recent results are below, the most recent result is correlated with a dose of 87.5 mg. per week: Lab Results  Component Value Date   INR 1.90 06/05/2011   INR 1.68* 06/03/2011   INR 1.90* 05/31/2011    ANTI-COAG DOSE:   Latest dosing instructions   Total Sun Mon Tue Wed Thu Fri Sat   95 12.5 mg 15 mg 12.5 mg 15 mg 12.5 mg 15 mg 12.5 mg    (5 mg2.5) (5 mg3) (5 mg2.5) (5 mg3) (5 mg2.5) (5 mg3) (5 mg2.5)         ANTICOAG SUMMARY: Anticoagulation Episode Summary              Current INR goal  Next INR check 06/19/2011   INR from last check 1.90 (06/05/2011)     Weekly max dose (mg)  Target end date    Indications DVT (Resolved), Long-term (current) use of anticoagulants   INR check location  Preferred lab    Send INR reminders to [facility]   Comments        Provider Role Specialty Phone number   Blanch Media  Internal Medicine [phone]        ANTICOAG TODAY: Anticoagulation Summary as of 06/05/2011              INR goal      Selected INR 1.90 (06/05/2011) Next INR check 06/19/2011   Weekly max dose (mg)  Target end date    Indications DVT (Resolved), Long-term (current) use of anticoagulants    Anticoagulation Episode Summary              INR check location  Preferred lab    Send INR reminders to ANTICOAG [facility]   Comments        Provider Role Specialty Phone number   Blanch Media  Internal Medicine [phone]        PATIENT INSTRUCTIONS: Patient Instructions  Patient instructed to take medications as defined in the Anti-coagulation Track section of this encounter.  Patient instructed to take today's dose.  Patient verbalized understanding of these instructions.  Patient instructed to take THREE (3) warfarin tablets on Mondays/Wednesdays/Fridays;  Take TWO and ONE-HALF (2 and 1/2) tablets on all other  days (Sundays/Tuesdays/Thursdays/Saturdays).       FOLLOW-UP Return in 2 weeks (on 06/19/2011) for Follow up INR at 4:15PM.  Hulen Luster, III Pharm.D., CACP

## 2011-06-05 NOTE — Patient Instructions (Signed)
Patient instructed to take medications as defined in the Anti-coagulation Track section of this encounter.  Patient instructed to take today's dose.  Patient verbalized understanding of these instructions.  Patient instructed to take THREE (3) warfarin tablets on Mondays/Wednesdays/Fridays;  Take TWO and ONE-HALF (2 and 1/2) tablets on all other days (Sundays/Tuesdays/Thursdays/Saturdays).

## 2011-06-07 ENCOUNTER — Other Ambulatory Visit: Payer: Self-pay | Admitting: Pharmacist

## 2011-06-07 DIAGNOSIS — I82409 Acute embolism and thrombosis of unspecified deep veins of unspecified lower extremity: Secondary | ICD-10-CM

## 2011-06-07 DIAGNOSIS — Z7901 Long term (current) use of anticoagulants: Secondary | ICD-10-CM

## 2011-06-07 MED ORDER — WARFARIN SODIUM 5 MG PO TABS: ORAL_TABLET | ORAL | Status: DC

## 2011-06-09 ENCOUNTER — Emergency Department (HOSPITAL_COMMUNITY): Payer: Medicaid - Out of State

## 2011-06-09 ENCOUNTER — Emergency Department (HOSPITAL_COMMUNITY)
Admission: EM | Admit: 2011-06-09 | Discharge: 2011-06-09 | Disposition: A | Discharge status: Home / Self Care | Payer: Medicaid - Out of State | Attending: Emergency Medicine | Admitting: Emergency Medicine

## 2011-06-09 ENCOUNTER — Encounter: Payer: Self-pay | Admitting: Internal Medicine

## 2011-06-09 ENCOUNTER — Encounter (HOSPITAL_COMMUNITY): Payer: Self-pay | Admitting: Emergency Medicine

## 2011-06-09 DIAGNOSIS — Z79899 Other long term (current) drug therapy: Secondary | ICD-10-CM | POA: Insufficient documentation

## 2011-06-09 DIAGNOSIS — R05 Cough: Secondary | ICD-10-CM | POA: Insufficient documentation

## 2011-06-09 DIAGNOSIS — J45901 Unspecified asthma with (acute) exacerbation: Secondary | ICD-10-CM | POA: Insufficient documentation

## 2011-06-09 DIAGNOSIS — Z7901 Long term (current) use of anticoagulants: Secondary | ICD-10-CM | POA: Insufficient documentation

## 2011-06-09 DIAGNOSIS — R059 Cough, unspecified: Secondary | ICD-10-CM | POA: Insufficient documentation

## 2011-06-09 DIAGNOSIS — R0602 Shortness of breath: Secondary | ICD-10-CM | POA: Insufficient documentation

## 2011-06-09 DIAGNOSIS — J40 Bronchitis, not specified as acute or chronic: Secondary | ICD-10-CM | POA: Insufficient documentation

## 2011-06-09 DIAGNOSIS — F3289 Other specified depressive episodes: Secondary | ICD-10-CM | POA: Insufficient documentation

## 2011-06-09 DIAGNOSIS — F329 Major depressive disorder, single episode, unspecified: Secondary | ICD-10-CM | POA: Insufficient documentation

## 2011-06-09 DIAGNOSIS — Z86718 Personal history of other venous thrombosis and embolism: Secondary | ICD-10-CM | POA: Insufficient documentation

## 2011-06-09 DIAGNOSIS — F17200 Nicotine dependence, unspecified, uncomplicated: Secondary | ICD-10-CM | POA: Insufficient documentation

## 2011-06-09 DIAGNOSIS — J45909 Unspecified asthma, uncomplicated: Secondary | ICD-10-CM | POA: Insufficient documentation

## 2011-06-09 LAB — CBC
HCT: 46.6 % (ref 39.0–52.0)
MCV: 95.5 fL (ref 78.0–100.0)
Platelets: 189 10*3/uL (ref 150–400)
RBC: 4.88 MIL/uL (ref 4.22–5.81)
WBC: 8.2 10*3/uL (ref 4.0–10.5)

## 2011-06-09 LAB — DIFFERENTIAL
Eosinophils Relative: 5 % (ref 0–5)
Lymphocytes Relative: 35 % (ref 12–46)
Lymphs Abs: 2.9 10*3/uL (ref 0.7–4.0)
Monocytes Absolute: 0.6 10*3/uL (ref 0.1–1.0)

## 2011-06-09 LAB — POCT I-STAT, CHEM 8
BUN: 21 mg/dL (ref 6–23)
Chloride: 106 mEq/L (ref 96–112)
Creatinine, Ser: 0.9 mg/dL (ref 0.50–1.35)
Sodium: 140 mEq/L (ref 135–145)
TCO2: 23 mmol/L (ref 0–100)

## 2011-06-09 MED ORDER — ALBUTEROL SULFATE (5 MG/ML) 0.5% IN NEBU
5.0000 mg | INHALATION_SOLUTION | Freq: Once | RESPIRATORY_TRACT | Status: AC
  Administered 2011-06-09: 5 mg via RESPIRATORY_TRACT
  Filled 2011-06-09: qty 1

## 2011-06-09 MED ORDER — PREDNISONE 20 MG PO TABS: 20.0000 mg | ORAL_TABLET | Freq: Every day | ORAL | Status: DC

## 2011-06-09 MED ORDER — IOHEXOL 300 MG/ML  SOLN
100.0000 mL | Freq: Once | INTRAMUSCULAR | Status: AC | PRN
  Administered 2011-06-09: 100 mL via INTRAVENOUS

## 2011-06-09 MED ORDER — ALBUTEROL SULFATE (5 MG/ML) 0.5% IN NEBU: 2.5000 mg | INHALATION_SOLUTION | RESPIRATORY_TRACT | Status: DC | PRN

## 2011-06-09 MED ORDER — IPRATROPIUM BROMIDE 0.02 % IN SOLN
0.5000 mg | Freq: Once | RESPIRATORY_TRACT | Status: AC
  Administered 2011-06-09: 0.5 mg via RESPIRATORY_TRACT
  Filled 2011-06-09: qty 2.5

## 2011-06-09 NOTE — Progress Notes (Signed)
  Subjective:    Patient ID: [name] [name], male    DOB: 07-25-1965, 45 y.o.   MRN: [id number]  HPI Mr.    Review of Systems     Objective:   Physical Exam        Assessment & Plan:

## 2011-06-09 NOTE — ED Notes (Signed)
Pt states has used his home nebulizer but not clearing this morning, neb started and pt tolerating well. No distress. Scattered wheezes bilaterally.

## 2011-06-09 NOTE — ED Provider Notes (Signed)
Medical screening examination/treatment/procedure(s) were conducted as a shared visit with non-physician practitioner(s) and myself.  I personally evaluated the patient during the encounter Devoria Albe, MD, Franz Dell, MD 06/09/11 [phone]

## 2011-06-09 NOTE — ED Notes (Signed)
Pt eating lunch and tolerating well. 

## 2011-06-09 NOTE — ED Provider Notes (Signed)
History     CSN: [id number] Arrival date & time: 06/09/2011  5:29 AM   First MD Initiated Contact with Patient 06/09/11 0606      Chief Complaint  Patient presents with  . Shortness of Breath   HPI The patient presents to emergency room with complaint of shortness of breath and wheezing for several days. Patient reports that he's been coughing at this time. Patient reports a productive cough. Patient reports that he's been and doxycycline for her bronchitis. Patient reports no improvement in symptoms. Patient states that since he is followed by outpatient clinics and has been taking his albuterol inhaler as directed. Patient denies any chest pain. Patient on Coumadin for DVT prophylaxis. Patient states he's been taking his Coumadin as directed. Patient denies missing any doses. Patient denies any inspiration chest pain. Patient denies any exertional chest pain or dyspnea.  Past Medical History  Diagnosis Date  . Asthma     hospitalized in past, questionable hx of bronchitis dx  . DVT (deep venous thrombosis)     times 2, on coumadin chronically  . Depression     hx of SI  . Substance abuse     crack, cocaine, last use 2007  . Tobacco abuse   . GERD (gastroesophageal reflux disease)   . DVT (deep venous thrombosis)   . Bronchitis     Past Surgical History  Procedure Date  . Skin graft     No family history on file.  History  Substance Use Topics  . Smoking status: Current Everyday Smoker  . Smokeless tobacco: Not on file  . Alcohol Use: Yes     OCCASIONAL      Review of Systems  Constitutional: Negative for fever, chills, diaphoresis and appetite change.  HENT: Negative for neck pain.   Eyes: Negative for photophobia and visual disturbance.  Respiratory: Positive for cough and shortness of breath. Negative for chest tightness.   Cardiovascular: Negative for chest pain.  Gastrointestinal: Negative for nausea, vomiting and abdominal pain.  Genitourinary: Negative  for flank pain.  Musculoskeletal: Negative for back pain.  Skin: Negative for rash.  Neurological: Negative for weakness and numbness.  All other systems reviewed and are negative.     Allergies  Review of patient's allergies indicates no known allergies.  Home Medications   Current Outpatient Rx  Name Route Sig Dispense Refill  . ALBUTEROL SULFATE HFA 108 (90 BASE) MCG/ACT IN AERS Inhalation Inhale 2 puffs into the lungs every 6 (six) hours as needed. For breathing     . ALBUTEROL SULFATE (2.5 MG/3ML) 0.083% IN NEBU Nebulization Take 3 mLs (2.5 mg total) by nebulization 3 (three) times daily. And every 3 hours as needed for wheezing or shortness of breath. 75 mL 11  . HYDROCOD POLST-CHLORPHEN POLST 10-8 MG/5ML PO LQCR Oral Take 5 mLs by mouth every 12 (twelve) hours as needed. For cough     . DSS 100 MG PO CAPS Oral Take 100 mg by mouth 2 (two) times daily. 20 capsule 0  . DOXYCYCLINE HYCLATE 100 MG PO TABS Oral Take 1 tablet (100 mg total) by mouth every 12 (twelve) hours. 12 tablet 0  . FLUTICASONE-SALMETEROL 250-50 MCG/DOSE IN AEPB Inhalation Inhale 1 puff into the lungs 2 (two) times daily. For asthma 60 each 11  . NICOTINE 14 MG/24HR TD PT24 Transdermal Place 1 patch onto the skin daily. 28 patch 3  . OMEPRAZOLE 40 MG PO CPDR Oral Take 1 capsule (40 mg total) by mouth  daily. 30 capsule 0  . PREDNISONE 10 MG PO TABS  3 pills on Thursday and Friday, then take 2 pills on Saturday and Sunday, then take 1 pill on Monday, and Tuesday. Take with breakfast. 12 tablet 0  . TRAMADOL HCL 50 MG PO TABS Oral Take 1 tablet (50 mg total) by mouth 4 (four) times daily. Maximum dose= 8 tablets per day 30 tablet 0  . WARFARIN SODIUM 5 MG PO TABS Oral Take 12.5-15 mg by mouth daily. Take 2 and 1/2 tablets on Mon, Wed, Fri and Sat, then take 3 tablets all other days       BP 108/77  Pulse 53  Temp(Src) 98.8 F (37.1 C) (Oral)  Resp 18  SpO2 98%  Physical Exam  Nursing note and vitals  reviewed. Constitutional: He is oriented to person, place, and time. He appears well-developed and well-nourished. No distress.  HENT:  Head: Normocephalic.  Mouth/Throat: Oropharynx is clear and moist.  Eyes: Pupils are equal, round, and reactive to light. Right eye exhibits no discharge. Left eye exhibits no discharge. No scleral icterus.  Neck: Normal range of motion. Neck supple.  Cardiovascular: Normal rate and regular rhythm.   Pulmonary/Chest: Effort normal. No stridor. No respiratory distress. He has wheezes. He has no rales. He exhibits no tenderness.  Abdominal: Soft. Bowel sounds are normal. There is no tenderness.  Musculoskeletal: Normal range of motion. He exhibits no edema and no tenderness.  Lymphadenopathy:    He has no cervical adenopathy.  Neurological: He is alert and oriented to person, place, and time.  Skin: Skin is warm and dry. No rash noted. He is not diaphoretic.  Psychiatric: He has a normal mood and affect. His behavior is normal. Judgment and thought content normal.    ED Course  Procedures (including critical care time)  Patient VSS reviewed. . Nursing notes reviewed. Initial testing ordered. Will monitor the patient closely. They agree with the treatment plan and diagnosis.   Results for orders placed during the hospital encounter of 06/09/11  TROPONIN I      Component Value Range   Troponin I <0.30  <0.30 (ng/mL)  PROTIME-INR      Component Value Range   Prothrombin Time 17.2 (*) 11.6 - 15.2 (seconds)   INR 1.38  0.00 - 1.49   CBC      Component Value Range   WBC 8.2  4.0 - 10.5 (K/uL)   RBC 4.88  4.22 - 5.81 (MIL/uL)   Hemoglobin 15.8  13.0 - 17.0 (g/dL)   HCT 16.1  09.6 - 04.5 (%)   MCV 95.5  78.0 - 100.0 (fL)   MCH 32.4  26.0 - 34.0 (pg)   MCHC 33.9  30.0 - 36.0 (g/dL)   RDW 40.9  81.1 - 91.4 (%)   Platelets 189  150 - 400 (K/uL)  DIFFERENTIAL      Component Value Range   Neutrophils Relative 52  43 - 77 (%)   Neutro Abs 4.3  1.7 - 7.7  (K/uL)   Lymphocytes Relative 35  12 - 46 (%)   Lymphs Abs 2.9  0.7 - 4.0 (K/uL)   Monocytes Relative 7  3 - 12 (%)   Monocytes Absolute 0.6  0.1 - 1.0 (K/uL)   Eosinophils Relative 5  0 - 5 (%)   Eosinophils Absolute 0.4  0.0 - 0.7 (K/uL)   Basophils Relative 1  0 - 1 (%)   Basophils Absolute 0.1  0.0 - 0.1 (K/uL)  POCT I-STAT, CHEM 8      Component Value Range   Sodium 140  135 - 145 (mEq/L)   Potassium 3.9  3.5 - 5.1 (mEq/L)   Chloride 106  96 - 112 (mEq/L)   BUN 21  6 - 23 (mg/dL)   Creatinine, Ser 9.14  0.50 - 1.35 (mg/dL)   Glucose, Bld 97  70 - 99 (mg/dL)   Calcium, Ion 7.82 (*) 1.12 - 1.32 (mmol/L)   TCO2 23  0 - 100 (mmol/L)   Hemoglobin 16.7  13.0 - 17.0 (g/dL)   HCT 95.6  21.3 - 08.6 (%)   Dg Chest 2 View  06/09/2011  *RADIOLOGY REPORT*  Clinical Data: Shortness of breath, cough, chest pain  CHEST - 2 VIEW  Comparison: 06/03/2011  Findings: Cardiomediastinal silhouette is within normal limits. The lungs are clear. No pleural effusion.  No pneumothorax.  No acute osseous abnormality.  IMPRESSION: Normal chest.  Original Report Authenticated By: Harrel Lemon, M.D.   06/09/2011  *RADIOLOGY REPORT*  Clinical Data:  Shortness of breath.  Subtherapeutic INR.  The patient is on Coumadin.  Bilateral chest pain.  CHEST CT WITHOUT CONTRAST  Technique:  Multidetector CT imaging of the chest was performed following the standard protocol without intravenous contrast.  Comparison:  Two-view chest x-ray 06/09/2011.  Findings:  Pulmonary arterial opacification is excellent.  The study is mildly degraded by patient breathing motion.  No focal filling defect is evident to suggest pulmonary emboli.  The heart size is normal.  No significant pleural or pericardial effusion is present.  There is no significant mediastinal or axillary adenopathy.  Limited imaging of the upper abdomen is unremarkable.  Mild dependent atelectasis is evident within the lungs bilaterally. No focal nodule, mass, or  airspace disease is evident otherwise.  The bone windows are unremarkable.  IMPRESSION:  1.  No evidence for pulmonary embolus. 2.  Mild dependent atelectasis in the lungs. 3.  Otherwise negative chest.  Original Report Authenticated By: Jamesetta Orleans. MATTERN, M.D.   11:33 AM Has an appointment for next Tuesday and coumadin apt. Take 15 mg today, 15 mg on Saturday, 15 mg on Sunday, resume normal dose on Monday per outpatient clinics. Stressed this to the patient. Patient stated agreement and understanding.   Date: 06/09/2011  Rate: 60  Rhythm: normal sinus rhythm  QRS Axis: normal  Intervals: normal  ST/T Wave abnormalities: normal  Conduction Disutrbances:none  Narrative Interpretation:   Old EKG Reviewed: unchanged11/5/12     MDM  Bronchitis, patient is currently on doxycylcine. Will d/c with prednisone taper and albuterol. No wheezing on re-examination. Patient is 100% on RA. Normal respiration rate. No apparent distress. No further management needed at this time.     Demetrius Charity, PA 06/09/11 1146  Demetrius Charity, [place] 06/09/11 1207

## 2011-06-09 NOTE — Progress Notes (Signed)
  Subjective:    Patient ID: [name] [name], male    DOB: 01-Nov-1965, 45 y.o.   MRN: [id number]  HPI [name] [name] is a 45 year old gentleman with past medical history significant for asthma, and GERD and recurrent DVT who presents to the emergency department for shortness of breath. Patient was recently admitted for asthma exacerbation and was prescribed doxycycline. Patient has 2 tablets of doxycycline remaining. Patient's INR was checked and was noted to be 1.38 which is subtherapeutic. Patient has history of being noncompliant with Coumadin therapy. There was concern for pulmonary embolism given subtherapeutic INR as well as shortness of breath and a CT angiogram was performed. CT angios negative for pulmonary embolism. Internal medicine teaching service was consult for further management of anticoagulation and arranging followup appointment.   Review of Systems  All other systems reviewed and are negative.       Objective:   Physical Exam  Nursing note and vitals reviewed.         Assessment & Plan:  1.) Shortness of breath-likely related to residual asthma related illness. Asthma exacerbation versus upper respiratory infection. Pulmonary embolism ruled out with negative CT angio. Coumadin regimen was reviewed. Coumadin was increased to: Fri - 15 mg Sat- 15 mg Sunday - 15 mg  Patient instructed to resume regular Coumadin dose on Monday. Patient has a followup appointment with Dr. Gilford Rile Tuesday, 06/13/2011.

## 2011-06-09 NOTE — ED Notes (Signed)
PT. REPORTS SOB WITH WHEEZING FOR SEVERAL DAYS UNRELIEVED BY [facility]I .

## 2011-06-13 ENCOUNTER — Encounter: Payer: Self-pay | Admitting: Internal Medicine

## 2011-06-13 ENCOUNTER — Encounter: Payer: Medicaid - Out of State | Admitting: Internal Medicine

## 2011-06-13 ENCOUNTER — Ambulatory Visit (INDEPENDENT_AMBULATORY_CARE_PROVIDER_SITE_OTHER): Payer: Self-pay | Admitting: Internal Medicine

## 2011-06-13 DIAGNOSIS — J45909 Unspecified asthma, uncomplicated: Secondary | ICD-10-CM

## 2011-06-13 DIAGNOSIS — Z23 Encounter for immunization: Secondary | ICD-10-CM

## 2011-06-13 DIAGNOSIS — J45901 Unspecified asthma with (acute) exacerbation: Secondary | ICD-10-CM

## 2011-06-13 DIAGNOSIS — F17200 Nicotine dependence, unspecified, uncomplicated: Secondary | ICD-10-CM

## 2011-06-13 MED ORDER — DOXYCYCLINE HYCLATE 100 MG PO TABS: 100.0000 mg | ORAL_TABLET | Freq: Two times a day (BID) | ORAL | Status: AC

## 2011-06-13 MED ORDER — PREDNISONE 10 MG PO TABS: ORAL_TABLET | ORAL | Status: DC

## 2011-06-13 NOTE — Assessment & Plan Note (Signed)
Patient was counseled on smoking cessation strategies including medications and behavior modification options. Patient said she was not ready to stop smoking at this time.    

## 2011-06-13 NOTE — Progress Notes (Signed)
  Subjective:    Patient ID: [name] [name], male    DOB: 22-Jan-1966, 45 y.o.   MRN: [id number]  HPI  Patient is a 45 yo male with a recent hospitalization for asthma exacerbation, presents to clinic for hospital followup, patient reports slight improvement of his symptoms however given financial situation he is unable to get his Advair and therefore is only using albuterol. Patient also notes that he is finished a dose of doxycycline but still is coughing and having upper restrict symptoms. Denies any fever or chills, denies any accessory muscle use, however does report increased wheezing. Reports that he still smokes about cigarettes per day.   Patient Active Problem List  Diagnoses  . ABUSE, OTHER/MIXED/UNSPECIFIED DRUG, UNSPC  . DEPRESSION  . ASTHMA  . GERD  . Long-term (current) use of anticoagulants  . TOBACCO ABUSE  . Asthma exacerbation   Current Outpatient Prescriptions on File Prior to Visit  Medication Sig Dispense Refill  . albuterol (PROVENTIL HFA;VENTOLIN HFA) 108 (90 BASE) MCG/ACT inhaler Inhale 2 puffs into the lungs every 6 (six) hours as needed. For breathing       . albuterol (PROVENTIL) (2.5 MG/3ML) 0.083% nebulizer solution Take 3 mLs (2.5 mg total) by nebulization 3 (three) times daily. And every 3 hours as needed for wheezing or shortness of breath.  75 mL  11  . albuterol (PROVENTIL) (5 MG/ML) 0.5% nebulizer solution Take 0.5 mLs (2.5 mg total) by nebulization every 4 (four) hours as needed for wheezing or shortness of breath.  20 mL  12  . chlorpheniramine-HYDROcodone (TUSSIONEX) 10-8 MG/5ML LQCR Take 5 mLs by mouth every 12 (twelve) hours as needed. For cough       . Fluticasone-Salmeterol (ADVAIR DISKUS) 250-50 MCG/DOSE AEPB Inhale 1 puff into the lungs 2 (two) times daily. For asthma  60 each  11  . nicotine (NICODERM CQ - DOSED IN MG/24 HOURS) 14 mg/24hr patch Place 1 patch onto the skin daily.  28 patch  3  . omeprazole (PRILOSEC) 40 MG capsule Take 1 capsule (40  mg total) by mouth daily.  30 capsule  0  . warfarin (COUMADIN) 5 MG tablet Take 12.5-15 mg by mouth daily. Take 2 and 1/2 tablets on Mon, Wed, Fri and Sat, then take 3 tablets all other days          Review of Systems  Respiratory: Positive for wheezing. Negative for apnea, chest tightness and shortness of breath.   All other systems reviewed and are negative.       Objective:   Physical Exam  Nursing note and vitals reviewed. Constitutional: He is oriented to person, place, and time. He appears well-developed and well-nourished.  HENT:  Head: Normocephalic and atraumatic.  Eyes: Pupils are equal, round, and reactive to light.  Neck: Normal range of motion. No JVD present. No thyromegaly present.  Cardiovascular: Normal rate, regular rhythm and normal heart sounds.   Pulmonary/Chest: Effort normal. No respiratory distress. He has wheezes. He has rales. He exhibits no tenderness.  Abdominal: Soft. Bowel sounds are normal. There is no tenderness. There is no rebound.  Musculoskeletal: Normal range of motion. He exhibits no edema.  Neurological: He is alert and oriented to person, place, and time.  Skin: Skin is warm and dry.          Assessment & Plan:

## 2011-06-13 NOTE — Assessment & Plan Note (Signed)
Patient breathing status is still not baseline, this is due to the patient's inability to afford his medication mainly Advair. Patient's O2 saturation was 97% on room air in the clinic therefore did not feel the need to admit him or keep him under close observation, I have given the patient to Advair samples until he is able to talk her financial officer for obtaining an [place] card, and also place the patient on a prednisone taper along with 7 days of doxycycline. I have advised the patient to return to the clinic or go to emergency room if his breathing status is worse

## 2011-06-13 NOTE — Patient Instructions (Signed)
Please take prednisone taper and doxycycline for 7 days for worsening asthma.

## 2011-06-13 NOTE — Progress Notes (Signed)
Addended by: Neomia Dear on: 06/13/2011 04:36 PM   Modules accepted: Orders

## 2011-06-19 ENCOUNTER — Ambulatory Visit: Payer: Medicaid - Out of State

## 2011-07-10 ENCOUNTER — Encounter (HOSPITAL_COMMUNITY): Payer: Self-pay | Admitting: Nurse Practitioner

## 2011-07-10 ENCOUNTER — Emergency Department (HOSPITAL_COMMUNITY)
Admission: EM | Admit: 2011-07-10 | Discharge: 2011-07-10 | Disposition: A | Discharge status: Home / Self Care | Payer: Medicaid - Out of State | Attending: Emergency Medicine | Admitting: Emergency Medicine

## 2011-07-10 DIAGNOSIS — J45901 Unspecified asthma with (acute) exacerbation: Secondary | ICD-10-CM | POA: Insufficient documentation

## 2011-07-10 DIAGNOSIS — Z79899 Other long term (current) drug therapy: Secondary | ICD-10-CM | POA: Insufficient documentation

## 2011-07-10 DIAGNOSIS — R0602 Shortness of breath: Secondary | ICD-10-CM | POA: Insufficient documentation

## 2011-07-10 DIAGNOSIS — F329 Major depressive disorder, single episode, unspecified: Secondary | ICD-10-CM | POA: Insufficient documentation

## 2011-07-10 DIAGNOSIS — F3289 Other specified depressive episodes: Secondary | ICD-10-CM | POA: Insufficient documentation

## 2011-07-10 DIAGNOSIS — Z7901 Long term (current) use of anticoagulants: Secondary | ICD-10-CM | POA: Insufficient documentation

## 2011-07-10 DIAGNOSIS — Z86718 Personal history of other venous thrombosis and embolism: Secondary | ICD-10-CM | POA: Insufficient documentation

## 2011-07-10 DIAGNOSIS — K219 Gastro-esophageal reflux disease without esophagitis: Secondary | ICD-10-CM | POA: Insufficient documentation

## 2011-07-10 LAB — PROTIME-INR: INR: 1.33 (ref 0.00–1.49)

## 2011-07-10 MED ORDER — PREDNISONE 20 MG PO TABS
60.0000 mg | ORAL_TABLET | Freq: Once | ORAL | Status: AC
  Administered 2011-07-10: 60 mg via ORAL
  Filled 2011-07-10: qty 3

## 2011-07-10 MED ORDER — ALBUTEROL SULFATE HFA 108 (90 BASE) MCG/ACT IN AERS
2.0000 | INHALATION_SPRAY | RESPIRATORY_TRACT | Status: DC | PRN
  Administered 2011-07-10: 2 via RESPIRATORY_TRACT
  Filled 2011-07-10: qty 6.7

## 2011-07-10 MED ORDER — PREDNISONE 20 MG PO TABS: 20.0000 mg | ORAL_TABLET | Freq: Every day | ORAL | Status: AC

## 2011-07-10 MED ORDER — IPRATROPIUM BROMIDE 0.02 % IN SOLN
0.5000 mg | Freq: Once | RESPIRATORY_TRACT | Status: AC
  Administered 2011-07-10: 0.5 mg via RESPIRATORY_TRACT
  Filled 2011-07-10: qty 2.5

## 2011-07-10 MED ORDER — ALBUTEROL SULFATE (5 MG/ML) 0.5% IN NEBU
5.0000 mg | INHALATION_SOLUTION | Freq: Once | RESPIRATORY_TRACT | Status: AC
  Administered 2011-07-10: 5 mg via RESPIRATORY_TRACT
  Filled 2011-07-10: qty 1

## 2011-07-10 NOTE — ED Provider Notes (Signed)
History     CSN: [id number]  Arrival date & time 07/10/11  1732   First MD Initiated Contact with Patient 07/10/11 2056      Chief Complaint  Patient presents with  . Asthma    (Consider location/radiation/quality/duration/timing/severity/associated sxs/prior treatment) HPI Increasingly sob over past week. Hx asthma. Taking inhalers with no relief. No cold symptoms. Can speak in full sentences.   Past Medical History  Diagnosis Date  . Asthma     hospitalized in past, questionable hx of bronchitis dx  . DVT (deep venous thrombosis)     times 2, on coumadin chronically  . Depression     hx of SI  . Substance abuse     crack, cocaine, last use 2007  . Tobacco abuse   . GERD (gastroesophageal reflux disease)   . DVT (deep venous thrombosis)   . Bronchitis     Past Surgical History  Procedure Date  . Skin graft     History reviewed. No pertinent family history.  History  Substance Use Topics  . Smoking status: Current Everyday Smoker -- 0.1 packs/day    Types: Cigarettes  . Smokeless tobacco: Not on file  . Alcohol Use: Yes     OCCASIONAL      Review of Systems  All other systems reviewed and are negative.    Allergies  Review of patient's allergies indicates no known allergies.  Home Medications   Current Outpatient Rx  Name Route Sig Dispense Refill  . ALBUTEROL SULFATE HFA 108 (90 BASE) MCG/ACT IN AERS Inhalation Inhale 2 puffs into the lungs every 6 (six) hours as needed. For breathing     . ALBUTEROL SULFATE (5 MG/ML) 0.5% IN NEBU Nebulization Take 2.5 mg by nebulization every 4 (four) hours as needed. For shortness of breath     . FLUTICASONE-SALMETEROL 250-50 MCG/DOSE IN AEPB Inhalation Inhale 1 puff into the lungs 2 (two) times daily. For asthma 60 each 11  . OMEPRAZOLE 40 MG PO CPDR Oral Take 1 capsule (40 mg total) by mouth daily. 30 capsule 0  . WARFARIN SODIUM 5 MG PO TABS Oral Take 12.5-15 mg by mouth daily. Take 2 and 1/2 tablets on Mon,  Wed, Fri and Sat, then take 3 tablets all other days    . PREDNISONE 20 MG PO TABS Oral Take 1 tablet (20 mg total) by mouth daily. 5 tablet 0    BP 120/78  Pulse 92  Temp(Src) 98.6 F (37 C) (Oral)  Resp 18  Ht 6\' 1"  (1.854 m)  Wt 240 lb (108.863 kg)  BMI 31.66 kg/m2  SpO2 96%  Physical Exam  Nursing note and vitals reviewed. Constitutional: He is oriented to person, place, and time. He appears well-developed and well-nourished. No distress.  HENT:  Head: Normocephalic and atraumatic.  Eyes: Pupils are equal, round, and reactive to light.  Neck: Normal range of motion.  Cardiovascular: Normal rate and intact distal pulses.   Pulmonary/Chest: No respiratory distress. He has wheezes.  Abdominal: Normal appearance. He exhibits no distension.  Musculoskeletal: Normal range of motion.  Neurological: He is alert and oriented to person, place, and time. No cranial nerve deficit.  Skin: Skin is warm and dry. No rash noted.  Psychiatric: He has a normal mood and affect. His behavior is normal.    ED Course  Procedures (including critical care time)  Labs Reviewed  PROTIME-INR - Abnormal; Notable for the following:    Prothrombin Time 16.7 (*)    All other  components within normal limits  LAB REPORT - SCANNED   No results found.   1. Asthma exacerbation       MDM          Nelia Shi, MD 07/11/11 1110

## 2011-07-10 NOTE — ED Notes (Addendum)
Increasingly sob over past week. Hx asthma. Taking inhalers with no relief. No cold symptoms. Can speak in full sentences.

## 2011-07-27 ENCOUNTER — Encounter (HOSPITAL_COMMUNITY): Payer: Self-pay | Admitting: Emergency Medicine

## 2011-07-27 ENCOUNTER — Emergency Department (HOSPITAL_COMMUNITY)
Admission: EM | Admit: 2011-07-27 | Discharge: 2011-07-27 | Disposition: A | Discharge status: Home / Self Care | Payer: Medicaid - Out of State | Attending: Emergency Medicine | Admitting: Emergency Medicine

## 2011-07-27 DIAGNOSIS — R0602 Shortness of breath: Secondary | ICD-10-CM | POA: Insufficient documentation

## 2011-07-27 DIAGNOSIS — R059 Cough, unspecified: Secondary | ICD-10-CM | POA: Insufficient documentation

## 2011-07-27 DIAGNOSIS — J45901 Unspecified asthma with (acute) exacerbation: Secondary | ICD-10-CM

## 2011-07-27 DIAGNOSIS — R05 Cough: Secondary | ICD-10-CM | POA: Insufficient documentation

## 2011-07-27 DIAGNOSIS — J3489 Other specified disorders of nose and nasal sinuses: Secondary | ICD-10-CM | POA: Insufficient documentation

## 2011-07-27 MED ORDER — PREDNISONE 20 MG PO TABS: 40.0000 mg | ORAL_TABLET | Freq: Every day | ORAL | Status: AC

## 2011-07-27 MED ORDER — PREDNISONE 20 MG PO TABS
60.0000 mg | ORAL_TABLET | Freq: Once | ORAL | Status: AC
  Administered 2011-07-27: 60 mg via ORAL
  Filled 2011-07-27: qty 3

## 2011-07-27 MED ORDER — ALBUTEROL SULFATE HFA 108 (90 BASE) MCG/ACT IN AERS
2.0000 | INHALATION_SPRAY | RESPIRATORY_TRACT | Status: DC | PRN
  Administered 2011-07-27 (×2): 2 via RESPIRATORY_TRACT
  Filled 2011-07-27: qty 6.7

## 2011-07-27 MED ORDER — ALBUTEROL SULFATE (5 MG/ML) 0.5% IN NEBU
5.0000 mg | INHALATION_SOLUTION | Freq: Once | RESPIRATORY_TRACT | Status: AC
  Administered 2011-07-27: 5 mg via RESPIRATORY_TRACT
  Filled 2011-07-27: qty 1

## 2011-07-27 MED ORDER — IPRATROPIUM BROMIDE 0.02 % IN SOLN
0.5000 mg | Freq: Once | RESPIRATORY_TRACT | Status: AC
  Administered 2011-07-27: 0.5 mg via RESPIRATORY_TRACT
  Filled 2011-07-27: qty 2.5

## 2011-07-27 MED ORDER — IPRATROPIUM-ALBUTEROL 18-103 MCG/ACT IN AERO: 2.0000 | INHALATION_SPRAY | Freq: Four times a day (QID) | RESPIRATORY_TRACT | Status: DC | PRN

## 2011-07-27 NOTE — ED Notes (Signed)
C/o sob and wheezing x 1 week (worse over past 3 days).  Ran out of inhaler today.

## 2011-07-27 NOTE — ED Notes (Signed)
Pt resting quietly with eyes closed, no s/s of any pain or distress observed at this time.

## 2011-07-27 NOTE — ED Provider Notes (Signed)
History     CSN: [id number]  Arrival date & time 07/27/11  4098   First MD Initiated Contact with Patient 07/27/11 0315      Chief Complaint  Patient presents with  . Shortness of Breath    (Consider location/radiation/quality/duration/timing/severity/associated sxs/prior treatment) Patient is a 46 y.o. male presenting with wheezing. The history is provided by the patient.  Wheezing  The current episode started today. The problem has been rapidly worsening. The problem is severe. The symptoms are relieved by beta-agonist inhalers and one or more prescription drugs. The symptoms are aggravated by activity and smoke exposure. Associated symptoms include rhinorrhea, cough, shortness of breath and wheezing. Pertinent negatives include no chest pain and no fever. The cough is productive. The cough is relieved by beta-agonist inhalers and prescription drugs. The cough is worsened by activity. He has had intermittent steroid use. He has had prior hospitalizations. He has had no prior ICU admissions. He has had no prior intubations. His past medical history is significant for asthma. Urine output has been normal. There were no sick contacts. Recently, medical care has been given at this facility.    Past Medical History  Diagnosis Date  . Asthma     hospitalized in past, questionable hx of bronchitis dx  . DVT (deep venous thrombosis)     times 2, on coumadin chronically  . Depression     hx of SI  . Substance abuse     crack, cocaine, last use 2007  . Tobacco abuse   . GERD (gastroesophageal reflux disease)   . DVT (deep venous thrombosis)   . Bronchitis     Past Surgical History  Procedure Date  . Skin graft     No family history on file.  History  Substance Use Topics  . Smoking status: Current Everyday Smoker -- 0.1 packs/day    Types: Cigarettes  . Smokeless tobacco: Not on file  . Alcohol Use: Yes     OCCASIONAL      Review of Systems  Constitutional: Negative for  fever.  HENT: Positive for rhinorrhea.   Respiratory: Positive for cough, shortness of breath and wheezing.   Cardiovascular: Negative for chest pain.  All other systems reviewed and are negative.    Allergies  Review of patient's allergies indicates no known allergies.  Home Medications   Current Outpatient Rx  Name Route Sig Dispense Refill  . ALBUTEROL SULFATE HFA 108 (90 BASE) MCG/ACT IN AERS Inhalation Inhale 2 puffs into the lungs every 6 (six) hours as needed. For breathing     . ALBUTEROL SULFATE (5 MG/ML) 0.5% IN NEBU Nebulization Take 2.5 mg by nebulization every 4 (four) hours as needed. For shortness of breath     . FLUTICASONE-SALMETEROL 250-50 MCG/DOSE IN AEPB Inhalation Inhale 1 puff into the lungs 2 (two) times daily. For asthma 60 each 11  . OMEPRAZOLE 40 MG PO CPDR Oral Take 1 capsule (40 mg total) by mouth daily. 30 capsule 0  . WARFARIN SODIUM 5 MG PO TABS Oral Take 12.5-15 mg by mouth daily. Take 2 and 1/2 tablets on Mon, Wed, Fri and Sat, then take 3 tablets all other days      BP 128/83  Pulse 85  Temp(Src) 97.6 F (36.4 C) (Oral)  Resp 24  SpO2 94%  Physical Exam  Vitals reviewed. Constitutional: He is oriented to person, place, and time. He appears well-developed and well-nourished. No distress.  HENT:  Head: Normocephalic and atraumatic.  Right Ear: External  ear normal.  Left Ear: External ear normal.  Mouth/Throat: Oropharynx is clear and moist.  Eyes: Conjunctivae and EOM are normal. Pupils are equal, round, and reactive to light. Right eye exhibits no discharge.  Neck: Normal range of motion. Neck supple.  Cardiovascular: Normal rate, regular rhythm, normal heart sounds and intact distal pulses.   No murmur heard. Pulmonary/Chest: Tachypnea noted. No respiratory distress. He has no decreased breath sounds. He has wheezes. He has no rhonchi. He has no rales.  Abdominal: Soft. There is no tenderness.  Musculoskeletal: Normal range of motion. He  exhibits no edema and no tenderness.  Neurological: He is alert and oriented to person, place, and time.  Skin: Skin is warm and dry. No rash noted.  Psychiatric: He has a normal mood and affect.    ED Course  Procedures (including critical care time)  Labs Reviewed - No data to display No results found.   1. Asthma attack       MDM  Pt with typical asthma exacerbation  symptoms.  No infectious sx, productive cough or other complaints.  Wheezing on exam.  will give steroids, albuterol/atrovent and recheck.  5:17 AM On reevaluation the wheezing has completely resolved on discharge patient home     Gwyneth Sprout, MD 07/27/11 [id number]

## 2011-08-15 ENCOUNTER — Emergency Department (HOSPITAL_COMMUNITY): Payer: Self-pay

## 2011-08-15 ENCOUNTER — Inpatient Hospital Stay (HOSPITAL_COMMUNITY)
Admission: EM | Admit: 2011-08-15 | Discharge: 2011-08-16 | DRG: 202 | Disposition: A | Discharge status: Home / Self Care | Payer: Medicaid - Out of State | Attending: Internal Medicine | Admitting: Internal Medicine

## 2011-08-15 ENCOUNTER — Encounter (HOSPITAL_COMMUNITY): Payer: Self-pay | Admitting: Emergency Medicine

## 2011-08-15 DIAGNOSIS — J45902 Unspecified asthma with status asthmaticus: Secondary | ICD-10-CM

## 2011-08-15 DIAGNOSIS — I825Y9 Chronic embolism and thrombosis of unspecified deep veins of unspecified proximal lower extremity: Secondary | ICD-10-CM | POA: Diagnosis present

## 2011-08-15 DIAGNOSIS — F1910 Other psychoactive substance abuse, uncomplicated: Secondary | ICD-10-CM | POA: Diagnosis present

## 2011-08-15 DIAGNOSIS — M7989 Other specified soft tissue disorders: Secondary | ICD-10-CM

## 2011-08-15 DIAGNOSIS — K219 Gastro-esophageal reflux disease without esophagitis: Secondary | ICD-10-CM | POA: Diagnosis present

## 2011-08-15 DIAGNOSIS — M79609 Pain in unspecified limb: Secondary | ICD-10-CM

## 2011-08-15 DIAGNOSIS — I82501 Chronic embolism and thrombosis of unspecified deep veins of right lower extremity: Secondary | ICD-10-CM | POA: Diagnosis present

## 2011-08-15 DIAGNOSIS — R058 Other specified cough: Secondary | ICD-10-CM | POA: Diagnosis present

## 2011-08-15 DIAGNOSIS — J45901 Unspecified asthma with (acute) exacerbation: Principal | ICD-10-CM | POA: Diagnosis present

## 2011-08-15 DIAGNOSIS — J309 Allergic rhinitis, unspecified: Secondary | ICD-10-CM | POA: Diagnosis present

## 2011-08-15 DIAGNOSIS — I82509 Chronic embolism and thrombosis of unspecified deep veins of unspecified lower extremity: Secondary | ICD-10-CM

## 2011-08-15 DIAGNOSIS — R05 Cough: Secondary | ICD-10-CM | POA: Diagnosis present

## 2011-08-15 DIAGNOSIS — Z7901 Long term (current) use of anticoagulants: Secondary | ICD-10-CM | POA: Diagnosis present

## 2011-08-15 DIAGNOSIS — F17200 Nicotine dependence, unspecified, uncomplicated: Secondary | ICD-10-CM | POA: Diagnosis present

## 2011-08-15 HISTORY — DX: Respiratory tuberculosis unspecified: A15.9

## 2011-08-15 HISTORY — DX: Cardiac murmur, unspecified: R01.1

## 2011-08-15 HISTORY — DX: Shortness of breath: R06.02

## 2011-08-15 LAB — CBC
MCV: 91.4 fL (ref 78.0–100.0)
Platelets: 201 10*3/uL (ref 150–400)
RBC: 5.02 MIL/uL (ref 4.22–5.81)
WBC: 6.3 10*3/uL (ref 4.0–10.5)

## 2011-08-15 LAB — RAPID URINE DRUG SCREEN, HOSP PERFORMED
Amphetamines: NOT DETECTED
Barbiturates: NOT DETECTED
Benzodiazepines: NOT DETECTED

## 2011-08-15 LAB — DIFFERENTIAL
Eosinophils Relative: 4 % (ref 0–5)
Lymphocytes Relative: 48 % — ABNORMAL HIGH (ref 12–46)
Lymphs Abs: 3 10*3/uL (ref 0.7–4.0)
Neutrophils Relative %: 41 % — ABNORMAL LOW (ref 43–77)

## 2011-08-15 LAB — POCT I-STAT, CHEM 8
BUN: 16 mg/dL (ref 6–23)
Creatinine, Ser: 1 mg/dL (ref 0.50–1.35)
Potassium: 4 mEq/L (ref 3.5–5.1)
Sodium: 142 mEq/L (ref 135–145)

## 2011-08-15 MED ORDER — HYDROCODONE-ACETAMINOPHEN 5-325 MG PO TABS
1.0000 | ORAL_TABLET | ORAL | Status: DC | PRN
  Administered 2011-08-15 – 2011-08-16 (×3): 2 via ORAL
  Filled 2011-08-15 (×3): qty 2

## 2011-08-15 MED ORDER — NICOTINE 7 MG/24HR TD PT24
7.0000 mg | MEDICATED_PATCH | TRANSDERMAL | Status: AC
  Administered 2011-08-15: 7 mg via TRANSDERMAL
  Filled 2011-08-15: qty 1

## 2011-08-15 MED ORDER — FLUTICASONE-SALMETEROL 250-50 MCG/DOSE IN AEPB
1.0000 | INHALATION_SPRAY | Freq: Two times a day (BID) | RESPIRATORY_TRACT | Status: DC
  Administered 2011-08-15 – 2011-08-16 (×2): 1 via RESPIRATORY_TRACT
  Filled 2011-08-15: qty 14

## 2011-08-15 MED ORDER — ALBUTEROL SULFATE (5 MG/ML) 0.5% IN NEBU
2.5000 mg | INHALATION_SOLUTION | Freq: Once | RESPIRATORY_TRACT | Status: AC
  Administered 2011-08-15: 5 mg via RESPIRATORY_TRACT
  Filled 2011-08-15: qty 1

## 2011-08-15 MED ORDER — DOCUSATE SODIUM 100 MG PO CAPS
100.0000 mg | ORAL_CAPSULE | Freq: Two times a day (BID) | ORAL | Status: DC
  Filled 2011-08-15 (×2): qty 1

## 2011-08-15 MED ORDER — PREDNISONE 20 MG PO TABS
60.0000 mg | ORAL_TABLET | Freq: Once | ORAL | Status: AC
  Administered 2011-08-15: 60 mg via ORAL
  Filled 2011-08-15: qty 3

## 2011-08-15 MED ORDER — WARFARIN SODIUM 5 MG PO TABS
5.0000 mg | ORAL_TABLET | Freq: Once | ORAL | Status: AC
  Administered 2011-08-15: 5 mg via ORAL
  Filled 2011-08-15: qty 1

## 2011-08-15 MED ORDER — PANTOPRAZOLE SODIUM 40 MG PO TBEC
40.0000 mg | DELAYED_RELEASE_TABLET | Freq: Every day | ORAL | Status: DC
  Administered 2011-08-15 – 2011-08-16 (×2): 40 mg via ORAL
  Filled 2011-08-15 (×2): qty 1

## 2011-08-15 MED ORDER — ENOXAPARIN SODIUM 120 MG/0.8ML ~~LOC~~ SOLN
105.0000 mg | Freq: Two times a day (BID) | SUBCUTANEOUS | Status: DC
  Administered 2011-08-15: 105 mg via SUBCUTANEOUS
  Filled 2011-08-15 (×5): qty 0.8

## 2011-08-15 MED ORDER — ONDANSETRON HCL 4 MG/2ML IJ SOLN: 4.0000 mg | Freq: Four times a day (QID) | INTRAMUSCULAR | Status: DC | PRN

## 2011-08-15 MED ORDER — ONDANSETRON HCL 4 MG PO TABS: 4.0000 mg | ORAL_TABLET | Freq: Four times a day (QID) | ORAL | Status: DC | PRN

## 2011-08-15 MED ORDER — ENOXAPARIN SODIUM 120 MG/0.8ML ~~LOC~~ SOLN
110.0000 mg | SUBCUTANEOUS | Status: AC
  Administered 2011-08-15: 110 mg via SUBCUTANEOUS
  Filled 2011-08-15: qty 0.8

## 2011-08-15 MED ORDER — ALBUTEROL SULFATE (5 MG/ML) 0.5% IN NEBU
15.0000 mg | INHALATION_SOLUTION | Freq: Once | RESPIRATORY_TRACT | Status: AC
  Administered 2011-08-15: 15 mg via RESPIRATORY_TRACT
  Filled 2011-08-15: qty 3
  Filled 2011-08-15: qty 0.5

## 2011-08-15 MED ORDER — IPRATROPIUM BROMIDE 0.02 % IN SOLN
1.0000 mg | Freq: Once | RESPIRATORY_TRACT | Status: AC
  Administered 2011-08-15: 1 mg via RESPIRATORY_TRACT
  Filled 2011-08-15 (×2): qty 2.5

## 2011-08-15 MED ORDER — METHYLPREDNISOLONE SODIUM SUCC 125 MG IJ SOLR
125.0000 mg | Freq: Four times a day (QID) | INTRAMUSCULAR | Status: DC
  Administered 2011-08-15 – 2011-08-16 (×5): 125 mg via INTRAVENOUS
  Filled 2011-08-15 (×8): qty 2

## 2011-08-15 MED ORDER — IPRATROPIUM BROMIDE 0.02 % IN SOLN
0.5000 mg | Freq: Four times a day (QID) | RESPIRATORY_TRACT | Status: DC
  Administered 2011-08-15 – 2011-08-16 (×4): 0.5 mg via RESPIRATORY_TRACT
  Filled 2011-08-15 (×4): qty 2.5

## 2011-08-15 MED ORDER — NICOTINE 7 MG/24HR TD PT24
7.0000 mg | MEDICATED_PATCH | Freq: Every day | TRANSDERMAL | Status: DC
  Administered 2011-08-16: 7 mg via TRANSDERMAL
  Filled 2011-08-15 (×2): qty 1

## 2011-08-15 MED ORDER — WARFARIN SODIUM 10 MG PO TABS
10.0000 mg | ORAL_TABLET | ORAL | Status: AC
  Administered 2011-08-15: 10 mg via ORAL
  Filled 2011-08-15: qty 1

## 2011-08-15 MED ORDER — ALBUTEROL SULFATE (5 MG/ML) 0.5% IN NEBU
2.5000 mg | INHALATION_SOLUTION | RESPIRATORY_TRACT | Status: DC | PRN
  Administered 2011-08-15 – 2011-08-16 (×4): 2.5 mg via RESPIRATORY_TRACT
  Filled 2011-08-15 (×4): qty 0.5

## 2011-08-15 NOTE — Progress Notes (Signed)
07/15/2011 patient had a Doppler done this moning. RN notified Dr Dierdre Searles at [phone] the result of the doppler. Biochemist, clinical.

## 2011-08-15 NOTE — ED Notes (Signed)
Resp therapy called regarding breathing treatment

## 2011-08-15 NOTE — ED Provider Notes (Signed)
History     CSN: [id number]  Arrival date & time 08/15/11  0043   First MD Initiated Contact with Patient 08/15/11 0139      Chief Complaint  Patient presents with  . Shortness of Breath    (Consider location/radiation/quality/duration/timing/severity/associated sxs/prior treatment) HPI Patient reports she's having an asthma attack onset yesterday afternoon. Treated with albuterol with minimal relief has also had one albuterol treatment here, nebulized with partial relief breathing not at baseline. No other complaint. No cough no fever. Admits to noncompliance with warfarin for the past 2 days. Past Medical History  Diagnosis Date  . Asthma     hospitalized in past, questionable hx of bronchitis dx  . DVT (deep venous thrombosis)     times 2, on coumadin chronically  . Depression     hx of SI  . Substance abuse     crack, cocaine, last use 2007  . Tobacco abuse   . GERD (gastroesophageal reflux disease)   . DVT (deep venous thrombosis)   . Bronchitis     Past Surgical History  Procedure Date  . Skin graft     No family history on file.  History  Substance Use Topics  . Smoking status: Current Everyday Smoker -- 0.1 packs/day    Types: Cigarettes  . Smokeless tobacco: Not on file  . Alcohol Use: Yes     OCCASIONAL      Review of Systems  Constitutional: Negative.   HENT: Negative.   Respiratory: Positive for shortness of breath and wheezing.   Cardiovascular: Negative.   Gastrointestinal: Negative.   Musculoskeletal: Negative.   Skin: Negative.   Neurological: Negative.   Hematological: Negative.   Psychiatric/Behavioral: Negative.   All other systems reviewed and are negative.    Allergies  Review of patient's allergies indicates no known allergies.  Home Medications   Current Outpatient Rx  Name Route Sig Dispense Refill  . ALBUTEROL SULFATE HFA 108 (90 BASE) MCG/ACT IN AERS Inhalation Inhale 2 puffs into the lungs every 6 (six) hours as  needed. For breathing     . ALBUTEROL SULFATE (5 MG/ML) 0.5% IN NEBU Nebulization Take 2.5 mg by nebulization every 4 (four) hours as needed. For shortness of breath     . IPRATROPIUM-ALBUTEROL 18-103 MCG/ACT IN AERO Inhalation Inhale 2 puffs into the lungs every 6 (six) hours as needed for wheezing or shortness of breath. 14.7 g 2  . FLUTICASONE-SALMETEROL 250-50 MCG/DOSE IN AEPB Inhalation Inhale 1 puff into the lungs 2 (two) times daily. For asthma 60 each 11  . OMEPRAZOLE 40 MG PO CPDR Oral Take 1 capsule (40 mg total) by mouth daily. 30 capsule 0  . WARFARIN SODIUM 5 MG PO TABS Oral Take 12.5-15 mg by mouth daily. Take 2 and 1/2 tablets on Mon, Wed, Fri and Sat, then take 3 tablets all other days      BP 120/78  Pulse 91  Temp(Src) 98.2 F (36.8 C) (Oral)  Resp 18  SpO2 93%  Physical Exam  Nursing note and vitals reviewed. Constitutional: He appears well-developed and well-nourished.  HENT:  Head: Normocephalic and atraumatic.  Eyes: Conjunctivae are normal. Pupils are equal, round, and reactive to light.  Neck: Neck supple. No tracheal deviation present. No thyromegaly present.  Cardiovascular: Normal rate and regular rhythm.   No murmur heard. Pulmonary/Chest: No respiratory distress. He has wheezes.       Speaks in paragraphs prolonged expiratory phase with expiratory wheezes  Abdominal: Soft. Bowel sounds are  normal. He exhibits no distension. There is no tenderness.  Musculoskeletal: Normal range of motion. He exhibits no edema and no tenderness.  Neurological: He is alert. Coordination normal.  Skin: Skin is warm and dry. No rash noted.  Psychiatric: He has a normal mood and affect.    ED Course  Procedures (including critical care time)  Labs Reviewed  CBC - Abnormal; Notable for the following:    MCHC 36.4 (*)    All other components within normal limits  DIFFERENTIAL - Abnormal; Notable for the following:    Neutrophils Relative 41 (*)    Lymphocytes Relative  48 (*)    All other components within normal limits  PROTIME-INR  POCT I-STAT, CHEM 8   No results found.  Results for orders placed during the hospital encounter of 08/15/11  PROTIME-INR      Component Value Range   Prothrombin Time 13.7  11.6 - 15.2 (seconds)   INR 1.03  0.00 - 1.49   CBC      Component Value Range   WBC 6.3  4.0 - 10.5 (K/uL)   RBC 5.02  4.22 - 5.81 (MIL/uL)   Hemoglobin 16.7  13.0 - 17.0 (g/dL)   HCT 86.5  78.4 - 69.6 (%)   MCV 91.4  78.0 - 100.0 (fL)   MCH 33.3  26.0 - 34.0 (pg)   MCHC 36.4 (*) 30.0 - 36.0 (g/dL)   RDW 29.5  28.4 - 13.2 (%)   Platelets 201  150 - 400 (K/uL)  DIFFERENTIAL      Component Value Range   Neutrophils Relative 41 (*) 43 - 77 (%)   Neutro Abs 2.5  1.7 - 7.7 (K/uL)   Lymphocytes Relative 48 (*) 12 - 46 (%)   Lymphs Abs 3.0  0.7 - 4.0 (K/uL)   Monocytes Relative 6  3 - 12 (%)   Monocytes Absolute 0.4  0.1 - 1.0 (K/uL)   Eosinophils Relative 4  0 - 5 (%)   Eosinophils Absolute 0.3  0.0 - 0.7 (K/uL)   Basophils Relative 1  0 - 1 (%)   Basophils Absolute 0.1  0.0 - 0.1 (K/uL)  POCT I-STAT, CHEM 8      Component Value Range   Sodium 142  135 - 145 (mEq/L)   Potassium 4.0  3.5 - 5.1 (mEq/L)   Chloride 107  96 - 112 (mEq/L)   BUN 16  6 - 23 (mg/dL)   Creatinine, Ser 4.40  0.50 - 1.35 (mg/dL)   Glucose, Bld 102 (*) 70 - 99 (mg/dL)   Calcium, Ion 7.25  3.66 - 1.32 (mmol/L)   TCO2 26  0 - 100 (mmol/L)   Hemoglobin 17.0  13.0 - 17.0 (g/dL)   HCT 44.0  34.7 - 42.5 (%)   No results found.  No diagnosis found.  5 AM after one hour of continuous nebulization patient states breathing is improved however not at baseline he speaks in paragraphs. Use accessory muscles to breathe lungs with prolonged expiratory phase  MDM  Lovenox ordered as patient has history of DVT, subtherapeutic INR due to medication noncompliance he states he's not taken any Coumadin in the past 2 days Outpatient clinic called will direct patient for  admission Will also order Coumadin 10 mg as patient is subtherapeutic Diagnosis #1 status asthmaticus #2 subtherapeutic INR        Doug Sou, MD 08/15/11 [phone]

## 2011-08-15 NOTE — ED Notes (Signed)
Patient states for the past 2 days he has been getting more SOB, +cough.  Insp and exp wheezing noted

## 2011-08-15 NOTE — Progress Notes (Signed)
08/15/2011 Patient came from the emergency room to 6700 around 0915. Patient is alert and oriented, also ambulates. Patient have a non-productive cough. Patient legs are dry, little discoloration on lower leg. Right side of abdomen, scratch area. The right legs is swollen, compare to left. Patient stated he have a dvt in the right leg. Biochemist, clinical.

## 2011-08-15 NOTE — H&P (Signed)
Hospital Admission Note Date: 08/15/2011  Patient name: [name] [name] Medical record number: [id number] Date of birth: 09-27-1965 Age: 46 y.o. Gender: male PCP: Genella Mech, MD, MD  Medical Service: [facility]  Attending physician: Dr. Mariana Arn    1st Contact:  Dr. Kristie Cowman   Pager:[phone] 2nd Contact:  Dr. Bard Herbert   [phone]  After 5 pm or weekends: 1st Contact:      Pager: [phone] 2nd Contact:      Pager: [phone]  Chief Complaint: shortness of breath and wheezing  History of Present Illness: This is 46 year old male with PMH of Asthma,  DVT on chronic coumadin and Polysubtances abusewho presents with progressive shortness of breath and wheezing. Patient states that he started to feel shortness of breath accompanied with productive cough 4 days ago and required more frequent usage of his inhalers. Her sputum is small amount and whitish color.  Patient reports that his symptoms have been progressively worsening, and he started to have wheezing and extreme SOB yesterday which were not relieved by his home inhalers and Neb. Patient presents to the ED for further evaluation. He received more neb treatment in the ED with only partial relief. Denies sick contact or recent travel. Denies hx of intubation.  Denies headache, fever, or sore throat.  No chest pain, chest pressure or palpitation. No nausea, vomiting, or abdominal pain. No melena, diarrhea or incontinence. No muscle weakness.                    Denies depression. No appetite or weight changes.   Of note, patient states that he has not taken his coumadin for 4-5 days because he is out of medications. He reports right leg calf pain for 2 days. He has chronic left LE swelling which has been unchanged.   Meds: Medications Prior to Admission  Medication Dose Route Frequency Provider Last Rate Last Dose  . albuterol (PROVENTIL) (5 MG/ML) 0.5% nebulizer solution 15 mg  15 mg  Nebulization Once Doug Sou, MD   15 mg at 08/15/11 0257  . albuterol (PROVENTIL) (5 MG/ML) 0.5% nebulizer solution 2.5 mg  2.5 mg Nebulization Once Doug Sou, MD   5 mg at 08/15/11 0058  . enoxaparin (LOVENOX) injection 110 mg  110 mg Subcutaneous To Major Doug Sou, MD   110 mg at 08/15/11 0557  . ipratropium (ATROVENT) nebulizer solution 1 mg  1 mg Nebulization Once Doug Sou, MD   1 mg at 08/15/11 0257  . predniSONE (DELTASONE) tablet 60 mg  60 mg Oral Once Doug Sou, MD   60 mg at 08/15/11 0211  . warfarin (COUMADIN) tablet 10 mg  10 mg Oral To Major Doug Sou, MD   10 mg at 08/15/11 0557   Medications Prior to Admission  Medication Sig Dispense Refill  . albuterol (PROVENTIL HFA;VENTOLIN HFA) 108 (90 BASE) MCG/ACT inhaler Inhale 2 puffs into the lungs every 6 (six) hours as needed. For breathing       . albuterol (PROVENTIL) (5 MG/ML) 0.5% nebulizer solution Take 2.5 mg by nebulization every 4 (four) hours as needed. For shortness of breath       . albuterol-ipratropium (COMBIVENT) 18-103 MCG/ACT inhaler Inhale 2 puffs into the lungs every 6 (six) hours as needed for wheezing or shortness of breath.  14.7 g  2  . Fluticasone-Salmeterol (ADVAIR DISKUS) 250-50 MCG/DOSE AEPB Inhale 1 puff into the lungs 2 (two) times daily. For asthma  60  each  11  . omeprazole (PRILOSEC) 40 MG capsule Take 1 capsule (40 mg total) by mouth daily.  30 capsule  0  . warfarin (COUMADIN) 5 MG tablet Take 12.5-15 mg by mouth daily. Take 2 and 1/2 tablets on Mon, Wed, Fri and Sat, then take 3 tablets all other days        Allergies: Review of patient's allergies indicates no known allergies. Past Medical History  Diagnosis Date  . Asthma     hospitalized in past, questionable hx of bronchitis dx  . DVT (deep venous thrombosis)     times 2, on coumadin chronically  . Depression     hx of SI  . Substance abuse     crack, cocaine, last use 2007  . Tobacco abuse   . GERD  (gastroesophageal reflux disease)   . DVT (deep venous thrombosis)   . Bronchitis    Past Surgical History  Procedure Date  . Skin graft    No family history on file. History   Social History  . Marital Status: Single    Spouse Name: N/A    Number of Children: N/A  . Years of Education: N/A   Occupational History  . Details Cars    Social History Main Topics  . Smoking status: Current Everyday Smoker -- 0.1 packs/day    Types: Cigarettes  . Smokeless tobacco: Not on file  . Alcohol Use: Yes     OCCASIONAL  . Drug Use: Not on file  . Sexually Active: Not on file   Other Topics Concern  . Not on file   Social History Narrative   Regular exercise-no(leg pain prevents)    Review of Systems: See above HPI  Physical Exam: Blood pressure 113/77, pulse 112, temperature 98.2 F (36.8 C), temperature source Oral, resp. rate 16, SpO2 91.00%. General: alert, well-developed, and cooperative to examination.  Head: normocephalic and atraumatic.  Eyes: vision grossly intact, pupils equal, pupils round, pupils reactive to light, no injection and anicteric.  Mouth: pharynx pink and moist, no erythema, and no exudates.  Neck: supple, full ROM, no thyromegaly, no JVD, and no carotid bruits.  Lungs: normal respiratory effort, no accessory muscle use, coarse breath sounds, B/L ant. And post. Wheezing noted. no crackles. Heart: normal rate, regular rhythm, no murmur, no gallop, and no rub.  Abdomen: soft, non-tender, normal bowel sounds, no distention, no guarding, no rebound tenderness, no hepatomegaly, and no splenomegaly.  Msk: no joint swelling, no joint warmth, and no redness over joints.  Pulses: 2+ DP/PT pulses bilaterally Extremities: No cyanosis or clubbing. Right LE mild edema and tender to palpation. No erythema. Neurologic: alert & oriented X3, cranial nerves II-XII intact, strength normal in all extremities, sensation intact to light touch, and gait normal.  Skin: turgor  normal and no rashes.  Psych: Oriented X3, memory intact for recent and remote, normally interactive, good eye contact, not anxious appearing, and not depressed appearing.    Lab results: Basic Metabolic Panel:  [facility] 08/15/11 0213  Taelyn Broecker 142  K 4.0  CL 107  CO2 --  GLUCOSE 100*  BUN 16  CREATININE 1.00  CALCIUM --  MG --  PHOS --   CBC:  Basename 08/15/11 0213 08/15/11 0205  WBC -- 6.3  NEUTROABS -- 2.5  HGB 17.0 16.7  HCT 50.0 45.9  MCV -- 91.4  PLT -- 201   Coagulation:  Basename 08/15/11 0205  LABPROT 13.7  INR 1.03   Urine Drug Screen: Drugs of  Abuse     Component Value Date/Time   LABOPIA NONE DETECTED 05/26/2011 2000   COCAINSCRNUR NONE DETECTED 05/26/2011 2000   LABBENZ NONE DETECTED 05/26/2011 2000   AMPHETMU NONE DETECTED 05/26/2011 2000   THCU NONE DETECTED 05/26/2011 2000   LABBARB NONE DETECTED 05/26/2011 2000    Assessment & Plan by Problem: # Asthma exacerbation   The clinical manifestation is consistent with the DX. Patient is afebrile without leukocytosis. Denies any cold or flu like symptoms. Denies sick contact.  Influenza infection or PNA is unlikely.  Plan: - admit to regular floor - monitor O2 Sat - IS - IV steroids - Neb treatment (albuterol and atrovent) - continue home dose Advair inhaler   # DVT. Patient has been on chronic coumadin therapy and he reports that he missed the doses for 4-5 days because he ran out of medications. He reports 2- day hx of new right LE calf pain. Revised Geneva score is 15, which placed him at high risk for DVT /PE.   Plan - will give him full dose lovenox bridging - coumadin per pharmacy - Right LE doppler r/o new DVT - follow up PT/INR  # Polysubstance abuse Plan - will check UDS - Nicotine patch  # VTE: lovenox and coumadin   Signed: Aarin Bluett 08/15/2011, 6:02 AM

## 2011-08-15 NOTE — H&P (Signed)
Internal Medicine Attending Admission Note Date: 08/15/2011  Patient name: JOANNA HALL Medical record number: [id number] Date of birth: [birth year] Age: 46 y.o. Gender: male  I saw and evaluated the patient. I also reviewed the resident's oral presentation will review the note once it is signed.  Chief Complaint(s):  Asthma exacerbation.  History - key components related to admission:  Mr. Kisiel is a 46 year old man with a history of asthma, recurrent deep venous thromboses, and polysubstance abuse who presents with a four-day history of progressive shortness of breath, wheezing, and nasal drainage. He ran out of his Advair inhaler approximately one month ago and wasn't able to afford a refill. Of note, he's got [place] Medicaid and is in the process of switching it to [facility]. Unfortunately, this process takes time and he has not had access to his Advair. He did have his albuterol nebulizer and MDI and has been using that more frequently than usual during this exacerbation. He ran out of his MDI yesterday. He therefore presented to the [facility] emergency department for further evaluation. He denies any sick contacts, recent travel, ever being intubated, fevers, shakes, or chills. He has had a history of sinus congestion with postnasal drip in the past. Unfortunately he has also been unable to afford his nasal steroid which he has used with some success in the past. He is without other complaints at this time.  Physical Exam - key components related to admission:  Filed Vitals:   08/15/11 0259 08/15/11 0549 08/15/11 0912 08/15/11 1339  BP:  113/77 102/60 108/59  Pulse:  112 74 101  Temp:   97.4 F (36.3 C) 98.1 F (36.7 C)  TempSrc:   Oral Oral  Resp:  16 19 20   Height:   6\' 4"  (1.93 m)   Weight:   234 lb 2.1 oz (106.2 kg)   SpO2: 94% 91% 97% 92%   Gen.: Well-developed, well-nourished, man lying comfortably in bed eating chicken. Lungs: Moderate decrement in air  movement with mild expiratory wheezes and prolonged expiratory phase. No crackles or consolidation. Heart: Tachycardic, regular rhythm, no murmurs, rubs, or gallops. Abdomen: Soft, non tender, active bowel sounds. Extremities: Right lower extremity slightly more swollen than the left lower extremity. Minimal edema.  Lab results:  Basic Metabolic Panel:  [facility] 08/15/11 0213  NA 142  K 4.0  CL 107  CO2 --  GLUCOSE 100*  BUN 16  CREATININE 1.00  CALCIUM --  MG --  PHOS --   CBC:  Basename 08/15/11 0213 08/15/11 0205  WBC -- 6.3  NEUTROABS -- 2.5  HGB 17.0 16.7  HCT 50.0 45.9  MCV -- 91.4  PLT -- 201   Coagulation:  Basename 08/15/11 0205  INR 1.03   Urine Drug Screen: Negative  Imaging results:  Dg Chest 2 View  08/15/2011  *RADIOLOGY REPORT*  Clinical Data: Shortness of breath with productive cough.  Asthma.  CHEST - 2 VIEW  Comparison: Radiographs and CT 06/09/2011.  Findings: The heart size and mediastinal contours are stable. There is stable mild chronic central airway thickening.  No hyperinflation, confluent airspace opacity or pleural effusion is identified.  Osseous structures appear unchanged.  IMPRESSION: Chronic central airway thickening consistent with bronchitis or reactive airways disease.  No acute process identified.  Original Report Authenticated By: Gerrianne Scale, M.D.   Assessment & Plan by Problem:  Mr. Zwilling is a 46 year old gentleman who presents with a history of progressive shortness of breath and wheezing  over the past 4 days. The symptoms began one month after running out of his inhaled steroid. The likely cause of his progressive dyspnea is an exacerbation of his asthma. The reason for the asthma exacerbation is a recent lack of anti-inflammatory therapy. He is symptomatically much improved after admission IV steroids and bronchodilators. We will restart his albuterol, Advair, and nasal steroids. I suspect he will quickly improve and be ready  for discharge within the next 48 hours. In the meantime he will continue to pursue conversion of his [place] Medicaid to [facility]. He will also be advised to quit smoking as this also may exacerbate his asthma.  Finally, he will be put on a Lovenox bridge and restarted on his Coumadin since he also ran out of this recently. Even if this exacerbation was secondary to a venous thromboembolism, therapy remains unchanged and he requires lifelong anticoagulation given his history of recurrent DVTs. Therefore we will not subject the patient to the costs and risks associated with further testing since the management will not be altered by this information.

## 2011-08-15 NOTE — Progress Notes (Signed)
ANTICOAGULATION CONSULT NOTE - Initial Consult  Pharmacy Consult for [facility] and Coumadin Indication: Hx DVTs  No Known Allergies  Patient Measurements: Height: 6\' 4"  (193 cm) Weight: 234 lb 2.1 oz (106.2 kg) IBW/kg (Calculated) : 86.8  Heparin Dosing Weight:   Vital Signs: Temp: 97.4 F (36.3 C) (02/05 0912) Temp src: Oral (02/05 0912) BP: 102/60 mmHg (02/05 0912) Pulse Rate: 74  (02/05 0912)  Labs:  Basename 08/15/11 0213 08/15/11 0205  HGB 17.0 16.7  HCT 50.0 45.9  PLT -- 201  APTT -- --  LABPROT -- 13.7  INR -- 1.03  HEPARINUNFRC -- --  CREATININE 1.00 --  CKTOTAL -- --  CKMB -- --  TROPONINI -- --   Estimated Creatinine Clearance: 124.8 ml/min (by C-G formula based on Cr of 1).  Medical History: Past Medical History  Diagnosis Date  . Asthma     hospitalized in past, questionable hx of bronchitis dx  . DVT (deep venous thrombosis)     times 2, on coumadin chronically  . Depression     hx of SI  . Substance abuse     crack, cocaine, last use 2007  . Tobacco abuse   . GERD (gastroesophageal reflux disease)   . DVT (deep venous thrombosis)   . Bronchitis     Medications:  Prescriptions prior to admission  Medication Sig Dispense Refill  . albuterol (PROVENTIL HFA;VENTOLIN HFA) 108 (90 BASE) MCG/ACT inhaler Inhale 2 puffs into the lungs every 6 (six) hours as needed. For breathing       . albuterol (PROVENTIL) (5 MG/ML) 0.5% nebulizer solution Take 2.5 mg by nebulization every 4 (four) hours as needed. For shortness of breath       . albuterol-ipratropium (COMBIVENT) 18-103 MCG/ACT inhaler Inhale 2 puffs into the lungs every 6 (six) hours as needed for wheezing or shortness of breath.  14.7 g  2  . Fluticasone-Salmeterol (ADVAIR DISKUS) 250-50 MCG/DOSE AEPB Inhale 1 puff into the lungs 2 (two) times daily. For asthma  60 each  11  . omeprazole (PRILOSEC) 40 MG capsule Take 1 capsule (40 mg total) by mouth daily.  30 capsule  0  . warfarin (COUMADIN) 5 MG  tablet Take 12.5-15 mg by mouth daily. Takes 15mg  daily except 12.5mg  on Tues, Thurs, and Sat        Assessment: Patient is a 46 y.o M who was admitted to the ED earlier today with c/o SOB.  He is on Coumadin PTA for hx DVTs. Patient reported that he ran out of his Coumadin supply and has not taken it in 4-5 days.  His home regimen was 15 mg daily except 12.5 mg on Tues, Thurs, Sat. INR is subtherapeutic at 1.00.  He received Lovenox 110 mg and Coumadin 10 mg in the ED at about 6 AM today.  Goal of Therapy:  INR 2-3   Plan:  1) Lovenox 105 mg SQ q12h 2) Will give an additional coumadin 5 mg to get 15mg  total today  Arnetia Bronk P 08/15/2011,9:37 AM

## 2011-08-15 NOTE — Progress Notes (Signed)
Right lower extremity venous duplex completed.  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

## 2011-08-15 NOTE — ED Notes (Signed)
Pt. Reports progressing sob with productive cough and wheezing onset today.

## 2011-08-16 ENCOUNTER — Other Ambulatory Visit: Payer: Self-pay | Admitting: Internal Medicine

## 2011-08-16 DIAGNOSIS — I82501 Chronic embolism and thrombosis of unspecified deep veins of right lower extremity: Secondary | ICD-10-CM | POA: Diagnosis present

## 2011-08-16 DIAGNOSIS — I82509 Chronic embolism and thrombosis of unspecified deep veins of unspecified lower extremity: Secondary | ICD-10-CM

## 2011-08-16 LAB — PROTIME-INR: Prothrombin Time: 16.1 seconds — ABNORMAL HIGH (ref 11.6–15.2)

## 2011-08-16 MED ORDER — PREDNISONE 20 MG PO TABS: 20.0000 mg | ORAL_TABLET | Freq: Every day | ORAL | Status: DC

## 2011-08-16 MED ORDER — PREDNISONE 10 MG PO TABS: 30.0000 mg | ORAL_TABLET | Freq: Every day | ORAL | Status: AC

## 2011-08-16 MED ORDER — WARFARIN SODIUM 7.5 MG PO TABS
15.0000 mg | ORAL_TABLET | Freq: Once | ORAL | Status: AC
  Administered 2011-08-16: 15 mg via ORAL
  Filled 2011-08-16: qty 2

## 2011-08-16 MED ORDER — PREDNISONE 20 MG PO TABS: 40.0000 mg | ORAL_TABLET | Freq: Every day | ORAL | Status: DC

## 2011-08-16 MED ORDER — PREDNISONE 20 MG PO TABS: 30.0000 mg | ORAL_TABLET | Freq: Every day | ORAL | Status: DC

## 2011-08-16 MED ORDER — PREDNISONE 10 MG PO TABS: 10.0000 mg | ORAL_TABLET | Freq: Every day | ORAL | Status: DC

## 2011-08-16 MED ORDER — ENOXAPARIN SODIUM 150 MG/ML ~~LOC~~ SOLN: 110.0000 mg | Freq: Two times a day (BID) | SUBCUTANEOUS | Status: DC

## 2011-08-16 MED ORDER — PREDNISONE 5 MG PO TABS: 5.0000 mg | ORAL_TABLET | Freq: Every day | ORAL | Status: AC

## 2011-08-16 MED ORDER — PREDNISONE 5 MG PO TABS: 5.0000 mg | ORAL_TABLET | Freq: Every day | ORAL | Status: DC

## 2011-08-16 MED ORDER — WARFARIN SODIUM 5 MG PO TABS: 15.0000 mg | ORAL_TABLET | Freq: Every day | ORAL | Status: DC

## 2011-08-16 MED ORDER — NICOTINE 7 MG/24HR TD PT24: 1.0000 | MEDICATED_PATCH | Freq: Every day | TRANSDERMAL | Status: AC

## 2011-08-16 MED ORDER — PREDNISONE 20 MG PO TABS: 20.0000 mg | ORAL_TABLET | Freq: Every day | ORAL | Status: AC

## 2011-08-16 MED ORDER — PREDNISONE 20 MG PO TABS: 40.0000 mg | ORAL_TABLET | Freq: Every day | ORAL | Status: AC

## 2011-08-16 MED ORDER — PREDNISONE 20 MG PO TABS
40.0000 mg | ORAL_TABLET | Freq: Every day | ORAL | Status: DC
  Filled 2011-08-16: qty 2

## 2011-08-16 MED ORDER — ALBUTEROL SULFATE HFA 108 (90 BASE) MCG/ACT IN AERS: 2.0000 | INHALATION_SPRAY | Freq: Four times a day (QID) | RESPIRATORY_TRACT | Status: DC | PRN

## 2011-08-16 MED ORDER — ENOXAPARIN SODIUM 120 MG/0.8ML ~~LOC~~ SOLN
105.0000 mg | Freq: Two times a day (BID) | SUBCUTANEOUS | Status: DC
  Filled 2011-08-16: qty 0.8

## 2011-08-16 MED ORDER — ALBUTEROL SULFATE (5 MG/ML) 0.5% IN NEBU: 2.5000 mg | INHALATION_SOLUTION | RESPIRATORY_TRACT | Status: DC | PRN

## 2011-08-16 MED ORDER — ENOXAPARIN SODIUM 150 MG/ML ~~LOC~~ SOLN: 105.0000 mg | Freq: Two times a day (BID) | SUBCUTANEOUS | Status: DC

## 2011-08-16 MED ORDER — ENOXAPARIN (LOVENOX) PATIENT EDUCATION KIT
PACK | Freq: Once | Status: AC
  Administered 2011-08-16: 17:00:00
  Filled 2011-08-16: qty 1

## 2011-08-16 MED ORDER — ENOXAPARIN (LOVENOX) PATIENT EDUCATION KIT: 105.0000 | PACK | Freq: Once | Status: DC

## 2011-08-16 MED ORDER — LORATADINE 10 MG PO TABS: 10.0000 mg | ORAL_TABLET | Freq: Every day | ORAL | Status: DC

## 2011-08-16 MED ORDER — LORATADINE 10 MG PO TABS
10.0000 mg | ORAL_TABLET | Freq: Every day | ORAL | Status: DC
  Administered 2011-08-16: 10 mg via ORAL
  Filled 2011-08-16: qty 1

## 2011-08-16 MED ORDER — ENOXAPARIN SODIUM 120 MG/0.8ML ~~LOC~~ SOLN
105.0000 mg | SUBCUTANEOUS | Status: AC
  Administered 2011-08-16: 105 mg via SUBCUTANEOUS
  Filled 2011-08-16: qty 0.8

## 2011-08-16 NOTE — Discharge Summary (Signed)
Internal Medicine Teaching [facility] Discharge Note  Name: [name] [name] MRN: [id number] DOB: 1966-03-01 46 y.o.  Date of Admission: 08/15/2011 12:52 AM Date of Discharge: 08/16/2011 Attending Physician: Doneen Poisson, MD  Discharge Diagnosis: Principal Problem:  *Asthma exacerbation Active Problems:  GERD  Long-term (current) use of anticoagulants  TOBACCO ABUSE  Leg DVT (deep venous thromboembolism), chronic  Upper Airway Cough Syndrome  Allergic Rhinitis  Discharge Medications: Medication List  As of 08/16/2011 10:01 PM   STOP taking these medications         albuterol-ipratropium 18-103 MCG/ACT inhaler         TAKE these medications            albuterol (5 MG/ML) 0.5% nebulizer solution   Commonly known as: PROVENTIL   Take 0.5 mLs (2.5 mg total) by nebulization every 4 (four) hours as needed for wheezing or shortness of breath.      albuterol 108 (90 BASE) MCG/ACT inhaler   Commonly known as: PROVENTIL HFA;VENTOLIN HFA   Inhale 2 puffs into the lungs every 6 (six) hours as needed. For breathing      enoxaparin 150 MG/ML injection   Commonly known as: LOVENOX   Inject 0.73 mLs (110 mg total) into the skin every 12 (twelve) hours.      enoxaparin Kit   Commonly known as: LOVENOX   105 each by Does not apply route once.      Fluticasone-Salmeterol 250-50 MCG/DOSE Aepb   Commonly known as: ADVAIR   Inhale 1 puff into the lungs 2 (two) times daily. For asthma      loratadine 10 MG tablet   Commonly known as: CLARITIN   Take 1 tablet (10 mg total) by mouth daily.      nicotine 7 mg/24hr patch   Commonly known as: NICODERM CQ - dosed in mg/24 hr   Place 1 patch onto the skin daily.      omeprazole 40 MG capsule   Commonly known as: PRILOSEC   Take 1 capsule (40 mg total) by mouth daily.      predniSONE 10 MG tablet   Commonly known as: DELTASONE   Take 3 tablets (30 mg total) by mouth daily with breakfast.      predniSONE 20 MG tablet   Commonly  known as: DELTASONE   Take 1 tablet (20 mg total) by mouth daily with breakfast.      predniSONE 10 MG tablet   Commonly known as: DELTASONE   Take 1 tablet (10 mg total) by mouth daily with breakfast.      predniSONE 5 MG tablet   Commonly known as: DELTASONE   Take 1 tablet (5 mg total) by mouth daily with breakfast.      predniSONE 20 MG tablet   Commonly known as: DELTASONE   Take 2 tablets (40 mg total) by mouth daily with breakfast.      warfarin 5 MG tablet   Commonly known as: COUMADIN   Take 3 tablets (15 mg total) by mouth daily. Takes 15mg  daily until instructed otherwise per Dr. [facility]            Disposition and follow-up:   [name] [name] was discharged from [facility] in Stable condition.    Follow-up Appointments: Follow-up Information    Follow up with SAWHNEY,MEGHA, MD. (08/21/11 2:15 INR check & 3:15 appt Internal Medicine Clinic)    Contact information:   [address] [place]  [place] [address] [phone]       Follow up with Janie Morning, Ricke Hey, PHARMD. (08/21/11 2:15 INR check in Internal Medicine Clinic)    Contact information:   - [address] [facility] [place] [address] [phone]         Discharge Orders    Future Appointments: Provider: Department: Dept Phone: Center:   08/21/2011 2:00 PM [facility] [phone] [facility]   08/21/2011 3:15 PM Elyse Jarvis, MD [facility] [phone] [facility]     Future Orders Please Complete By Expires   Diet - low sodium heart healthy      Increase activity slowly      Discharge instructions      Comments:   Follow up for INR check and evaluation in Internal Medicine Clinic 08/21/11 2:15pm.      Consultations: Case Management    Procedures Performed:  Dg Chest 2 View  08/15/2011  *RADIOLOGY REPORT*  Clinical Data: Shortness of breath with productive cough.  Asthma.  CHEST - 2 VIEW  Comparison: Radiographs and CT  06/09/2011.  Findings: The heart size and mediastinal contours are stable. There is stable mild chronic central airway thickening.  No hyperinflation, confluent airspace opacity or pleural effusion is identified.  Osseous structures appear unchanged.  IMPRESSION: Chronic central airway thickening consistent with bronchitis or reactive airways disease.  No acute process identified.  Original Report Authenticated By: Gerrianne Scale, M.D.    Admission LKG:MWNU is 46 year old male with PMH of Asthma, DVT on chronic coumadin and Polysubtances abuse who presented with progressive shortness of breath and wheezing. Patient stated that he ran out of his inhaled steroid a month ago and started to feel shortness of breath accompanied with productive cough 4 days prior to admission and required more frequent usage of his inhalers. He reported small amount of whitish sputum.  Patient reported that his symptoms had been progressively worsening, and he started to have wheezing and extreme SOB 1 day prior to presentation which were not relieved by his home inhalers and Neb thus he came to the ED for further evaluation. He received more neb treatment in the ED with only partial relief. Denied sick contact or recent travel. Denied hx of intubation.  Denies headache, fever, or sore throat. No chest pain, chest pressure or palpitation.  No nausea, vomiting, or abdominal pain. No melena, diarrhea or incontinence. No muscle weakness.  Denied depression. No appetite or weight changes.  Of note, patient stated that he has not taken his coumadin for 4-5 days because he is out of medications. He reported right leg calf pain for 2 days. He has chronic right LE swelling which has been unchanged.    Physical Exam:  Blood pressure 113/77, pulse 112, temperature 98.2 F (36.8 C), temperature source Oral, resp. rate 16, SpO2 91.00%.  General: alert, well-developed, and cooperative to examination.  Head: normocephalic and  atraumatic.  Eyes: vision grossly intact, pupils equal, pupils round, pupils reactive to light, no injection and anicteric.  Mouth: pharynx pink and moist, no erythema, and no exudates.  Neck: supple, full ROM, no thyromegaly, no JVD, and no carotid bruits.  Lungs: normal respiratory effort, no accessory muscle use, coarse breath sounds, B/L ant. And post. Wheezing noted. no crackles. Heart: normal rate, regular rhythm, no murmur, no gallop, and no rub.  Abdomen: soft, non-tender, normal bowel sounds, no distention, no guarding, no rebound tenderness, no hepatomegaly, and no splenomegaly.  Msk: no joint swelling,  no joint warmth, and no redness over joints.  Pulses: 2+ DP/PT pulses bilaterally Extremities: No cyanosis or clubbing. Right LE mild edema and tender to palpation. No erythema. Neurologic: alert & oriented X3, cranial nerves II-XII intact, strength normal in all extremities, sensation intact to light touch, and gait normal.  Skin: turgor normal and no rashes.  Psych: Oriented X3, memory intact for recent and remote, normally interactive, good eye contact, not anxious appearing, and not depressed appearing.  Hospital Course by problem list: #1 Asthma exacerbation: [name] [name]'s symptoms which included progressive shortness of breath and wheezing for the past 4 days was controlled and improved with IV steroids, supplemental oxygen, nebulized ipratropium and albuterol which was converted to anti-inflammatory inhalers and his home regimen of bronchodilators.  He remained afebrile without leukocytosis throughout his admission.  By time of discharge he was saturating oxygen at 99% on room air with improvement in his respiratory status on lung exam. He was supplied bronchodilators by the pharmacy for short term use on discharge as well as given a prescription for nebulized albuterol due to cost concerns for the patient and a two-week prednisone steroid taper which should be completed by  ~08/31/11.  #2 Chronic Cough likely due to Upper Airway Cough Syndrome. He has a persistent cough which is likely secondary to allergic rhinitis and post nasal secretions.  As this could also be a variant to his asthma, [name] [name] was advised of the benefit to using an antihistamine such as chlorpheniramine as well as continuing a proton pump inhibitor for his GERD.  He was discharged with loratidine and omeprazole.   #3 Deep Venous Thromboembolism: On admission [name] [name] INR was subtherapeutic at 1.03.  He reported that he has missed ~4-5 doses of Coumadin due to inability to afford a refill and complained of new right side lower extremity pain.  Ultrasound of the LE was conducted which demonstrated chronic right popliteal DVT.  Pharmacy was consulted to assist in bridging with Lovenox until a therapeutic level of Coumadin is achieved.  [name] [name] reports that this has occurred in the past, most recently in Nov 2012 which required ~2 weeks before his INR level reached 2-3. By time of discharge he was on Coumadin 15 mg daily and Lovenox 105 mg [place] q12h with INR 1.26.  He was provided a two week supply of Lovenox filled syringes and Coumadin by the hospital pharmacy.  He is to follow-up in the Internal Medicine Coumadin Clinic at [facility] with INR check Monday, 08/21/11 at 2:15pm.   #4 Polysubstance abuse: Urine drug screen was negative for cocaine on this admission. He was counseled against smoking to improve his health status especially his lungs. He was maintained on Nicotine patch during his stay and was interested in quitting smoking upon discharge. He was informed of 1-800-QUIT-NOW for further support as an outpatient.  At his follow-up with PCP 08/21/11 the titration and affordability of Nicotine patches should be re-addressed if patient is still interested in smoking cessation.   #5 Disposition: improved and stable from respiratory standpoint, information given to patient for mail prescriptions through  [facility], Case Manager was assisting with obtaining medications.  Of note the patient reports having Medicaid of [place] but has been treated at this hospital since 2007 thus it is very likely that he in fact no longer has Medicaid coverage.  He was discharged home with follow up 08/21/2011 with INR/Dr. Alexandria Lodge at 2:15 for further adjustment of his Coumadin therapy and to be evaluated  by Dr. Dorthula Rue 08/21/11 at 3:15pm.    Discharge Vitals:  BP 128/72  Pulse 86  Temp(Src) 98.2 F (36.8 C) (Oral)  Resp 20  Ht 6\' 4"  (1.93 m)  Wt 238 lb 15.7 oz (108.4 kg)  BMI 29.09 kg/m2  SpO2 96%  Discharge Labs:  Results for orders placed during the hospital encounter of 08/15/11 (from the past 24 hour(s))  PROTIME-INR     Status: Abnormal   Collection Time   08/16/11  9:53 AM      Component Value Range   Prothrombin Time 16.1 (*) 11.6 - 15.2 (seconds)   INR 1.26  0.00 - 1.49     Lab 08/15/11 0213  NA 142  K 4.0  CL 107  CO2 --  GLUCOSE 100*  BUN 16  CREATININE 1.00  CALCIUM --  MG --  PHOS --    Lab 08/15/11 0213 08/15/11 0205  HGB 17.0 16.7  HCT 50.0 45.9  WBC -- 6.3  PLT -- 201     Signed: Zackory Pudlo 08/16/2011, 10:01 PM

## 2011-08-16 NOTE — Progress Notes (Signed)
   CARE MANAGEMENT NOTE 08/16/2011  Patient:  [facility]   Account Number:  [id number]  Date Initiated:  08/16/2011  Documentation initiated by:  Anikah Hogge  Subjective/Objective Assessment:   Pt registered as self pay, now with multiple prescriptions for medicatins including Lovenox.     Action/Plan:   Application for Lovenox completed and left for MD to complete. CM has asked MD for duplicate prescriptions. Will sent to pharmacy when complete.   Anticipated DC Date:  08/16/2011   Anticipated DC Plan:  HOME/SELF CARE      DC Planning Services  Medication Assistance      Choice offered to / List presented to:             Status of service:  Completed, signed off Medicare Important Message given?   (If response is "NO", the following Medicare IM given date fields will be blank) Date Medicare IM given:   Date Additional Medicare IM given:    Discharge Disposition:  HOME/SELF CARE  Per UR Regulation:  Reviewed for med. necessity/level of care/duration of stay  Comments:  08/16/2011 Application for Lovenox program started and await MD to complete , will need duplicate prescriptions in order to assist pt with medication upon discharge. Johny Shock RN MPH Case Manager [phone]

## 2011-08-16 NOTE — Progress Notes (Signed)
ANTICOAGULATION CONSULT NOTE - Follow Up Consult  Pharmacy Consult for Coumadin, lovenox Indication: Hx DVTs  No Known Allergies  Patient Measurements: Height: 6\' 4"  (193 cm) Weight: 238 lb 15.7 oz (108.4 kg) IBW/kg (Calculated) : 86.8  Heparin Dosing Weight:   Vital Signs: Temp: 97.8 F (36.6 C) (02/06 0512) Temp src: Oral (02/06 0512) BP: 131/63 mmHg (02/06 0512) Pulse Rate: 88  (02/06 0512)  Labs:  Basename 08/15/11 0213 08/15/11 0205  HGB 17.0 16.7  HCT 50.0 45.9  PLT -- 201  APTT -- --  LABPROT -- 13.7  INR -- 1.03  HEPARINUNFRC -- --  CREATININE 1.00 --  CKTOTAL -- --  CKMB -- --  TROPONINI -- --   Estimated Creatinine Clearance: 125.9 ml/min (by C-G formula based on Cr of 1).  Assessment: Patient is a 46 y.o M on Lovenox and Coumadin for hx DVTs.  INR is subtherapeutic but is trending up after 15 mg dose given yesterday. No bleeding noted.  Goal of Therapy:  INR 2-3   Plan:  1)  Continue lovenox 105 mg SQ q12h 2)  Coumadin 15 mg PO x1 today  Delano Frate P 08/16/2011,8:32 AM

## 2011-08-16 NOTE — Progress Notes (Signed)
Internal Medicine Attending  Date: 08/16/2011  Patient name: [name] [name] Medical record number: [id number] Date of birth: 1965/12/31 Age: 46 y.o. Gender: male  I saw and evaluated the patient. I reviewed the resident's note by Dr. Bosie Clos and I agree with the resident's findings and plans as documented in her progress note.  Mr. Dorey is a 46 year old man admitted for an asthma exacerbation secondary to running out of his anti-inflammatory medication. He also requires bridging for recurrent DVTs as he ran out of his Coumadin and is no longer in the therapeutic window. He has been restarted on his anti-inflammatory inhalers and his bronchodilators were continued.  We are in the process of trying to get him to transfer his Medicaid from [place] to [place] where he can continue to get appropriate medications. In the meantime, his asthma will be treated with prednisone and will be tapered slowly over a period of 2 weeks. He will also be put on a Lovenox bridge until his INR is therapeutic. I agree with discharge today and follow up in the Coumadin clinic as well as in the Internal Medicine Center.

## 2011-08-16 NOTE — Progress Notes (Addendum)
Chart reviewed, noted pt on Lovenox and Coumadin and is registered as self pay. Noted that pt had [place] Medicaid and is attempting to obtain [place] Medicaid. Interesting as pt has record of hospitalization in [facility] since 2007. Unclear when he was in [place] and qualified for Medicaid, however we can most likely assist with Lovenox if this is the prescribed medication at d/c and may be able to assist with inhalers. Please notify CM of any d/c needs.  Johny Shock RN MPH Case Manager [phone] Call by RN re Lovenox  For this pt, however no CM order as yet, this CM called MD who states that pt does need assistance. Application completed and left on shadow chart for MD signature and completion. MD informed of need for duplicate prescriptions. Met with pt who request assistance with medications, signed application.  Await completion of form, will send to pharmacy at that point.  Johny Shock RN MPH Case manager [phone]

## 2011-08-16 NOTE — Progress Notes (Signed)
Subjective: No acute events overnight, Improvement in shortness of breath and wheezing  Objective: Vital signs in last 24 hours: Filed Vitals:   08/15/11 2231 08/16/11 0512 08/16/11 0837 08/16/11 0952  BP: 123/70 131/63  120/65  Pulse: 92 88  91  Temp: 97.5 F (36.4 C) 97.8 F (36.6 C)  98.1 F (36.7 C)  TempSrc: Oral Oral  Oral  Resp: 20 16  20   Height:      Weight: 238 lb 15.7 oz (108.4 kg)     SpO2: 95% 97% 99% 98%   Weight change:   Intake/Output Summary (Last 24 hours) at 08/16/11 1248 Last data filed at 08/16/11 0700  Gross per 24 hour  Intake    960 ml  Output    750 ml  Net    210 ml   Physical Exam: Gen.: Well-developed, well-nourished, sitting on side of bed on telephone Lungs: coughing fit on initial deep inspiration, improvement in air movement with occ mild expiratory wheezes R>L, No crackles or rhonchi Heart: RRR, no murmurs, rubs, or gallops appreciated Abdomen: Soft, non tender, active bowel sounds.  Extremities: Right lower extremity slightly more swollen than the left lower extremity. Minimal edema.  Lab Results: Basic Metabolic Panel:  Lab 08/15/11 9604  NA 142  K 4.0  CL 107  CO2 --  GLUCOSE 100*  BUN 16  CREATININE 1.00  CALCIUM --  MG --  PHOS --    CBC:  Lab 08/15/11 0213 08/15/11 0205  WBC -- 6.3  NEUTROABS -- 2.5  HGB 17.0 16.7  HCT 50.0 45.9  MCV -- 91.4  PLT -- 201    Coagulation:  Lab 08/16/11 0953 08/15/11 0205  LABPROT 16.1* 13.7  INR 1.26 1.03    Urine Drug Screen: Drugs of Abuse     Component Value Date/Time   LABOPIA NONE DETECTED 08/15/2011 1655   COCAINSCRNUR NONE DETECTED 08/15/2011 1655   LABBENZ NONE DETECTED 08/15/2011 1655   AMPHETMU NONE DETECTED 08/15/2011 1655   THCU NONE DETECTED 08/15/2011 1655   LABBARB NONE DETECTED 08/15/2011 1655    Studies/Results: Dg Chest 2 View  08/15/2011  *RADIOLOGY REPORT*  Clinical Data: Shortness of breath with productive cough.  Asthma.  CHEST - 2 VIEW  Comparison:  Radiographs and CT 06/09/2011.  Findings: The heart size and mediastinal contours are stable. There is stable mild chronic central airway thickening.  No hyperinflation, confluent airspace opacity or pleural effusion is identified.  Osseous structures appear unchanged.  IMPRESSION: Chronic central airway thickening consistent with bronchitis or reactive airways disease.  No acute process identified.  Original Report Authenticated By: Gerrianne Scale, M.D.   Medications: I have reviewed the patient's current medications. Scheduled Meds:    . docusate sodium  100 mg Oral BID  . enoxaparin (LOVENOX) injection  105 mg Subcutaneous NOW  . enoxaparin (LOVENOX) injection  105 mg Subcutaneous Q12H  . Fluticasone-Salmeterol  1 puff Inhalation BID  . loratadine  10 mg Oral Daily  . nicotine  7 mg Transdermal Daily  . nicotine  7 mg Transdermal To [place]  . pantoprazole  40 mg Oral Q1200  . predniSONE  40 mg Oral Q breakfast   Followed by  . predniSONE  30 mg Oral Q breakfast   Followed by  . predniSONE  20 mg Oral Q breakfast   Followed by  . predniSONE  10 mg Oral Q breakfast   Followed by  . predniSONE  5 mg Oral Q breakfast  . warfarin  15 mg Oral ONCE-1800  . DISCONTD: enoxaparin (LOVENOX) injection  105 mg Subcutaneous Q12H  . DISCONTD: ipratropium  0.5 mg Nebulization Q6H  . DISCONTD: methylPREDNISolone (SOLU-MEDROL) injection  125 mg Intravenous Q6H  . DISCONTD: predniSONE  10 mg Oral Q breakfast  . DISCONTD: predniSONE  20 mg Oral Q breakfast  . DISCONTD: predniSONE  30 mg Oral Q breakfast  . DISCONTD: predniSONE  40 mg Oral Q breakfast  . DISCONTD: predniSONE  40 mg Oral Q breakfast  . DISCONTD: predniSONE  5 mg Oral Q breakfast   Continuous Infusions:  PRN Meds:.albuterol, HYDROcodone-acetaminophen, ondansetron (ZOFRAN) IV, ondansetron Assessment/Plan: 46yo male admitted with asthma exacerbation in setting of chronic DVT and tobacco abuse. #1 Asthma exacerbation: controlled and  improved with bronchodilators, saturating O2  99% room air Plan:  - continue to monitor O2 Sat  - change IV steroids to oral - Neb treatment (albuterol and atrovent) prn - continue home dose Advair inhaler   #2 DVT: Chronic R popliteal DVT on Dopplers of LE, currently bridging with Lovenox and at Coumadin 15 mg, INR 1.26, pt reports h/o taking ~2 weeks to become therapeutic on Coumadin Plan  - cont coumadin  -INR check Monday in [facility] clinic with Dr. Alexandria Lodge   Lab 08/16/11 1610 08/15/11 0205  INR 1.26 1.03    #3 Polysubstance abuse: negative UDS, pt counseled against smoking, he would like to continue with patch on d/c Plan  - cont Nicotine patch and titrate as outpatient  #4 VTE: on lovenox and coumadin, pt reports knowing how to give Lovenox injections to himself from prior admission in Nov 2012 PLAN -cont Lovenox bridge -d/c on Coumadin 15 mg qd until evaluation in clinic   #5 Disposition: improved and stable from respiratory standpoint, information given to patient for mail prescriptions through [facility], PLAN -d/c home today   LOS: 1 day   Blue Ruggerio 08/16/2011, 12:48 PM

## 2011-08-17 DIAGNOSIS — J309 Allergic rhinitis, unspecified: Secondary | ICD-10-CM | POA: Diagnosis present

## 2011-08-17 DIAGNOSIS — R05 Cough: Secondary | ICD-10-CM | POA: Diagnosis present

## 2011-08-21 ENCOUNTER — Ambulatory Visit (INDEPENDENT_AMBULATORY_CARE_PROVIDER_SITE_OTHER): Payer: Medicaid - Out of State | Admitting: Internal Medicine

## 2011-08-21 ENCOUNTER — Telehealth: Payer: Self-pay | Admitting: Pharmacist

## 2011-08-21 ENCOUNTER — Encounter: Payer: Self-pay | Admitting: Internal Medicine

## 2011-08-21 ENCOUNTER — Ambulatory Visit (INDEPENDENT_AMBULATORY_CARE_PROVIDER_SITE_OTHER): Payer: Medicaid - Out of State | Admitting: Pharmacist

## 2011-08-21 DIAGNOSIS — I82509 Chronic embolism and thrombosis of unspecified deep veins of unspecified lower extremity: Secondary | ICD-10-CM

## 2011-08-21 DIAGNOSIS — J45909 Unspecified asthma, uncomplicated: Secondary | ICD-10-CM

## 2011-08-21 DIAGNOSIS — F17200 Nicotine dependence, unspecified, uncomplicated: Secondary | ICD-10-CM

## 2011-08-21 DIAGNOSIS — I82409 Acute embolism and thrombosis of unspecified deep veins of unspecified lower extremity: Secondary | ICD-10-CM

## 2011-08-21 DIAGNOSIS — Z7901 Long term (current) use of anticoagulants: Secondary | ICD-10-CM

## 2011-08-21 MED ORDER — WARFARIN SODIUM 5 MG PO TABS: ORAL_TABLET | ORAL | Status: DC

## 2011-08-21 MED ORDER — FLUTICASONE-SALMETEROL 250-50 MCG/DOSE IN AEPB: 1.0000 | INHALATION_SPRAY | Freq: Two times a day (BID) | RESPIRATORY_TRACT | Status: DC

## 2011-08-21 NOTE — Progress Notes (Signed)
  Subjective:    Patient ID: [name] [name], male    DOB: 28-Feb-1966, 46 y.o.   MRN: [id number]  HPI: 46 year old man with past medical history significant for asthma, chronic right popliteal DVT on long-term Coumadin comes to the clinic as a  hospital follow up.  1) Chronic right popliteal DVT: He continues to complain of pain in his right leg where has the blood clot. He was seen by Dr. Alexandria Lodge with today's visit.  2) Asthma excacerberation: He still reports some chest tightness and congestion but states that it's much better than what he has at the admission. He states that he has been using his albuterol and Advair inhalers as directed. He is still taking his prednisone but tomorrow being the last day.  He was requesting a doctor's letter for filing for the disability.      Review of Systems  Constitutional: Negative for fever.  HENT: Positive for congestion. Negative for sneezing and postnasal drip.   Respiratory: Positive for chest tightness and shortness of breath.   Musculoskeletal: Negative for arthralgias.  Neurological: Negative for tremors and weakness.  Hematological: Negative for adenopathy.  Psychiatric/Behavioral: Negative for agitation.       Objective:   Physical Exam  Constitutional: He is oriented to person, place, and time. He appears well-developed and well-nourished. No distress.  HENT:  Head: Normocephalic and atraumatic.  Mouth/Throat: No oropharyngeal exudate.  Eyes: Conjunctivae and EOM are normal. Pupils are equal, round, and reactive to light.  Neck: Normal range of motion. Neck supple. No JVD present. No tracheal deviation present. No thyromegaly present.  Cardiovascular: Normal rate, regular rhythm, normal heart sounds and intact distal pulses.  Exam reveals no gallop and no friction rub.   No murmur heard. Pulmonary/Chest: Effort normal and breath sounds normal. No stridor. No respiratory distress. He has no wheezes. He has no rales.  Abdominal: Soft.  Bowel sounds are normal. He exhibits no distension. There is no tenderness. There is no rebound.  Musculoskeletal: Normal range of motion. He exhibits no edema and no tenderness.  Lymphadenopathy:    He has no cervical adenopathy.  Neurological: He is alert and oriented to person, place, and time. He has normal reflexes. No cranial nerve deficit.  Skin: He is not diaphoretic.          Assessment & Plan:

## 2011-08-21 NOTE — Progress Notes (Signed)
Anti-Coagulation Progress Note  [name] [name] is a 46 y.o. male who is currently on an anti-coagulation regimen.    RECENT RESULTS: Recent results are below, the most recent result is correlated with a dose of 95 mg. per week: Lab Results  Component Value Date   INR 1.90 08/21/2011   INR 1.26 08/16/2011   INR 1.03 08/15/2011    ANTI-COAG DOSE:   Latest dosing instructions   Total Sun Mon Tue Wed Thu Fri Sat   105 15 mg 15 mg 15 mg 15 mg 15 mg 15 mg 15 mg    (5 mg3) (5 mg3) (5 mg3) (5 mg3) (5 mg3) (5 mg3) (5 mg3)         ANTICOAG SUMMARY: Anticoagulation Episode Summary              Current INR goal  Next INR check 09/04/2011   INR from last check 1.90 (08/21/2011)     Weekly max dose (mg)  Target end date    Indications Long-term (current) use of anticoagulants, DVT (Resolved), Leg DVT (deep venous thromboembolism), chronic   INR check location  Preferred lab    Send INR reminders to ANTICOAG [facility]   Comments        Provider Role Specialty Phone number   Blanch Media, MD  Internal Medicine [phone]        ANTICOAG TODAY: Anticoagulation Summary as of 08/21/2011              INR goal      Selected INR 1.90 (08/21/2011) Next INR check 09/04/2011   Weekly max dose (mg)  Target end date    Indications Long-term (current) use of anticoagulants, DVT (Resolved), Leg DVT (deep venous thromboembolism), chronic    Anticoagulation Episode Summary              INR check location  Preferred lab    Send INR reminders to ANTICOAG [facility]   Comments        Provider Role Specialty Phone number   Blanch Media, MD  Internal Medicine [phone]        PATIENT INSTRUCTIONS: Patient Instructions  Patient instructed to take medications as defined in the Anti-coagulation Track section of this encounter.  Patient instructed to take today's dose. Continue administering Lovenox syringes as given at discharged--as instructed until complete. Patient verbalized understanding  of these instructions.        FOLLOW-UP Return in 2 weeks (on 09/04/2011) for Follow up INR.  Hulen Luster, III Pharm.D., CACP

## 2011-08-21 NOTE — Assessment & Plan Note (Signed)
He still reports some chest tightness and congestion but states that it's getting better. He is about to complete his course for his prednisone. He states that he has had PFTs in the past but never got a report - Continue albuterol and Advair. - I was unable to see his PFT' s in the chart. Would try to obtain records. - He was advised to followup with financial counselor , who could help him with orange card and further medical assistance.

## 2011-08-21 NOTE — Assessment & Plan Note (Signed)
His INR is still subtherapeutic. He was seen by Dr. Alexandria Lodge at today's visit. - Continue Lovenox and Coumadin dosed by Dr. Alexandria Lodge.

## 2011-08-21 NOTE — Patient Instructions (Signed)
Please schedule a follow up appointment in 1-2 months . Please bring your medication bottles with your next appointment. Please take your medicines as prescribed. I will call you with your lab results if anything will be abnormal. 

## 2011-08-21 NOTE — Patient Instructions (Signed)
Patient instructed to take medications as defined in the Anti-coagulation Track section of this encounter.  Patient instructed to take today's dose. Continue administering Lovenox syringes as given at discharged--as instructed until complete. Patient verbalized understanding of these instructions.

## 2011-08-21 NOTE — Assessment & Plan Note (Signed)
Counseling on tobacco cessation and discussed various aids. It seems like he's not interested at this point

## 2011-09-04 ENCOUNTER — Ambulatory Visit (INDEPENDENT_AMBULATORY_CARE_PROVIDER_SITE_OTHER): Payer: Medicaid - Out of State | Admitting: Pharmacist

## 2011-09-04 DIAGNOSIS — I82409 Acute embolism and thrombosis of unspecified deep veins of unspecified lower extremity: Secondary | ICD-10-CM

## 2011-09-04 DIAGNOSIS — I82509 Chronic embolism and thrombosis of unspecified deep veins of unspecified lower extremity: Secondary | ICD-10-CM

## 2011-09-04 DIAGNOSIS — Z7901 Long term (current) use of anticoagulants: Secondary | ICD-10-CM

## 2011-09-04 LAB — POCT INR: INR: 2.3

## 2011-09-04 NOTE — Progress Notes (Signed)
Anti-Coagulation Progress Note  [name] [name] is a 46 y.o. male who is currently on an anti-coagulation regimen.    RECENT RESULTS: Recent results are below, the most recent result is correlated with a dose of 105 mg. per week: Lab Results  Component Value Date   INR 2.30 09/04/2011   INR 1.90 08/21/2011   INR 1.26 08/16/2011    ANTI-COAG DOSE:   Latest dosing instructions   Total Sun Mon Tue Wed Thu Fri Sat   105 15 mg 15 mg 15 mg 15 mg 15 mg 15 mg 15 mg    (5 mg3) (5 mg3) (5 mg3) (5 mg3) (5 mg3) (5 mg3) (5 mg3)         ANTICOAG SUMMARY: Anticoagulation Episode Summary              Current INR goal  Next INR check 09/25/2011   INR from last check 2.30 (09/04/2011)     Weekly max dose (mg)  Target end date    Indications Long-term (current) use of anticoagulants, DVT (Resolved), Leg DVT (deep venous thromboembolism), chronic   INR check location  Preferred lab    Send INR reminders to ANTICOAG [facility]   Comments        Provider Role Specialty Phone number   Blanch Media, MD  Internal Medicine [phone]        ANTICOAG TODAY: Anticoagulation Summary as of 09/04/2011              INR goal      Selected INR 2.30 (09/04/2011) Next INR check 09/25/2011   Weekly max dose (mg)  Target end date    Indications Long-term (current) use of anticoagulants, DVT (Resolved), Leg DVT (deep venous thromboembolism), chronic    Anticoagulation Episode Summary              INR check location  Preferred lab    Send INR reminders to ANTICOAG [facility]   Comments        Provider Role Specialty Phone number   Blanch Media, MD  Internal Medicine [phone]        PATIENT INSTRUCTIONS: Patient Instructions  Patient instructed to take medications as defined in the Anti-coagulation Track section of this encounter.  Patient instructed to taqke today's dose.  Patient verbalized understanding of these instructions.        FOLLOW-UP Return in 3 weeks (on 09/25/2011) for Follow  up INR.  Hulen Luster, III Pharm.D., CACP

## 2011-09-04 NOTE — Patient Instructions (Signed)
Patient instructed to take medications as defined in the Anti-coagulation Track section of this encounter.  Patient instructed to taqke today's dose.  Patient verbalized understanding of these instructions.     

## 2011-09-06 ENCOUNTER — Telehealth: Payer: Self-pay | Admitting: *Deleted

## 2011-09-06 NOTE — Telephone Encounter (Addendum)
Pt needs a back to school  note or letter, it must list all diagnosis

## 2011-09-08 NOTE — Telephone Encounter (Signed)
I can get the letter prepared by Monday.  Thank you, Aubrea Meixner

## 2011-09-09 ENCOUNTER — Encounter: Payer: Self-pay | Admitting: Internal Medicine

## 2011-09-25 ENCOUNTER — Ambulatory Visit: Payer: Medicaid - Out of State

## 2011-11-14 ENCOUNTER — Encounter (HOSPITAL_COMMUNITY): Payer: Self-pay | Admitting: Emergency Medicine

## 2011-11-14 ENCOUNTER — Emergency Department (HOSPITAL_COMMUNITY)
Admission: EM | Admit: 2011-11-14 | Discharge: 2011-11-14 | Disposition: A | Discharge status: Home / Self Care | Payer: Self-pay | Attending: Emergency Medicine | Admitting: Emergency Medicine

## 2011-11-14 DIAGNOSIS — F17200 Nicotine dependence, unspecified, uncomplicated: Secondary | ICD-10-CM | POA: Insufficient documentation

## 2011-11-14 DIAGNOSIS — J45909 Unspecified asthma, uncomplicated: Secondary | ICD-10-CM | POA: Insufficient documentation

## 2011-11-14 DIAGNOSIS — Z86718 Personal history of other venous thrombosis and embolism: Secondary | ICD-10-CM | POA: Insufficient documentation

## 2011-11-14 DIAGNOSIS — R05 Cough: Secondary | ICD-10-CM | POA: Insufficient documentation

## 2011-11-14 DIAGNOSIS — J40 Bronchitis, not specified as acute or chronic: Secondary | ICD-10-CM | POA: Insufficient documentation

## 2011-11-14 DIAGNOSIS — K219 Gastro-esophageal reflux disease without esophagitis: Secondary | ICD-10-CM | POA: Insufficient documentation

## 2011-11-14 DIAGNOSIS — R059 Cough, unspecified: Secondary | ICD-10-CM | POA: Insufficient documentation

## 2011-11-14 MED ORDER — ALBUTEROL SULFATE HFA 108 (90 BASE) MCG/ACT IN AERS
2.0000 | INHALATION_SPRAY | Freq: Once | RESPIRATORY_TRACT | Status: AC
  Administered 2011-11-14: 2 via RESPIRATORY_TRACT
  Filled 2011-11-14: qty 6.7

## 2011-11-14 MED ORDER — AZITHROMYCIN 250 MG PO TABS: ORAL_TABLET | ORAL | Status: AC

## 2011-11-14 MED ORDER — DEXAMETHASONE SODIUM PHOSPHATE 10 MG/ML IJ SOLN
10.0000 mg | Freq: Once | INTRAMUSCULAR | Status: AC
  Administered 2011-11-14: 10 mg via INTRAMUSCULAR
  Filled 2011-11-14: qty 1

## 2011-11-14 MED ORDER — AZITHROMYCIN 250 MG PO TABS
500.0000 mg | ORAL_TABLET | Freq: Once | ORAL | Status: AC
  Administered 2011-11-14: 500 mg via ORAL
  Filled 2011-11-14: qty 2

## 2011-11-14 NOTE — ED Notes (Signed)
Pt reports having chest congestion year round but has worsen over the last week clear / white productive cough

## 2011-11-14 NOTE — ED Provider Notes (Signed)
Medical screening examination/treatment/procedure(s) were performed by non-physician practitioner and as supervising physician I was immediately available for consultation/collaboration.    Vida Roller, MD 11/14/11 [phone]

## 2011-11-14 NOTE — Discharge Instructions (Signed)

## 2011-11-14 NOTE — ED Provider Notes (Signed)
History     CSN: [id number]  Arrival date & time 11/14/11  0008   First MD Initiated Contact with Patient 11/14/11 [phone]      Chief Complaint  Patient presents with  . Nasal Congestion    chest congestion chronic    (Consider location/radiation/quality/duration/timing/severity/associated sxs/prior treatment) HPI Comments: Patient here with a several day history of cough with green sputum production - reports is a smoker and normally has a smoker's cough- reports increase in this and nasal congestion as well - denies fever, chills, chest pain, shortness of breath, nausea or vomiting - denies sore throat but reports change in voice.  Patient is a 46 y.o. male presenting with cough. The history is provided by the patient. No language interpreter was used.  Cough This is a new problem. The current episode started more than 2 days ago. The problem occurs constantly. The problem has not changed since onset.The cough is productive of sputum. There has been no fever. Associated symptoms include rhinorrhea. Pertinent negatives include no chest pain, no chills, no sweats, no weight loss, no ear congestion, no ear pain, no headaches, no sore throat, no myalgias, no shortness of breath, no wheezing and no eye redness. He has tried nothing for the symptoms. The treatment provided no relief. He is a smoker. His past medical history is significant for bronchitis.    Past Medical History  Diagnosis Date  . Asthma     hospitalized in past, questionable hx of bronchitis dx  . DVT (deep venous thrombosis)     times 2, on coumadin chronically  . Depression     hx of SI  . Substance abuse     crack, cocaine, last use 2007  . Tobacco abuse   . GERD (gastroesophageal reflux disease)   . DVT (deep venous thrombosis)   . Bronchitis   . Heart murmur   . Shortness of breath   . Tuberculosis     ' I test Positive "    Past Surgical History  Procedure Date  . Skin graft     History reviewed. No  pertinent family history.  History  Substance Use Topics  . Smoking status: Current Everyday Smoker -- 0.1 packs/day for 23 years    Types: Cigarettes  . Smokeless tobacco: Former Neurosurgeon  . Alcohol Use: Yes     OCCASIONAL      Review of Systems  Constitutional: Negative for chills and weight loss.  HENT: Positive for rhinorrhea. Negative for ear pain and sore throat.   Eyes: Negative for redness.  Respiratory: Positive for cough. Negative for shortness of breath and wheezing.   Cardiovascular: Negative for chest pain.  Musculoskeletal: Negative for myalgias.  Neurological: Negative for headaches.  All other systems reviewed and are negative.    Allergies  Review of patient's allergies indicates no known allergies.  Home Medications   Current Outpatient Rx  Name Route Sig Dispense Refill  . ALBUTEROL SULFATE HFA 108 (90 BASE) MCG/ACT IN AERS Inhalation Inhale 2 puffs into the lungs every 6 (six) hours as needed. For breathing 1 Inhaler 6  . ALBUTEROL SULFATE (5 MG/ML) 0.5% IN NEBU Nebulization Take 2.5 mg by nebulization every 4 (four) hours as needed. For shortness of breath     . FLUTICASONE-SALMETEROL 250-50 MCG/DOSE IN AEPB Inhalation Inhale 1 puff into the lungs 2 (two) times daily. For asthma 1 each 5  . LORATADINE 10 MG PO TABS Oral Take 1 tablet (10 mg total) by mouth daily. 30  tablet 0  . OMEPRAZOLE 40 MG PO CPDR Oral Take 1 capsule (40 mg total) by mouth daily. 30 capsule 0  . WARFARIN SODIUM 5 MG PO TABS  Take as directed by anticoagulation clinic provider. Currently taking 3x5mg  warfarin daily. 90 tablet 3    This is a 1 month supply  . ENOXAPARIN SODIUM 150 MG/ML [place] SOLN Subcutaneous Inject 1 mL (150 mg total) into the skin every 12 (twelve) hours. 4 mL 0    BP 120/81  Pulse 87  Temp(Src) 97.8 F (36.6 C) (Oral)  Resp 19  SpO2 97%  Physical Exam  Nursing note and vitals reviewed. Constitutional: He is oriented to person, place, and time. He appears  well-developed and well-nourished. No distress.  HENT:  Head: Normocephalic and atraumatic.  Right Ear: External ear normal.  Left Ear: External ear normal.  Mouth/Throat: Oropharynx is clear and moist. No oropharyngeal exudate.       Boggy nasal mucosa  Eyes: Conjunctivae are normal. Pupils are equal, round, and reactive to light. No scleral icterus.  Neck: Normal range of motion. Neck supple.  Cardiovascular: Normal rate, regular rhythm and normal heart sounds.  Exam reveals no gallop and no friction rub.   No murmur heard. Pulmonary/Chest: Effort normal and breath sounds normal. No respiratory distress. He has no wheezes. He has no rales. He exhibits no tenderness.  Abdominal: Soft. Bowel sounds are normal. He exhibits no distension. There is no tenderness.  Musculoskeletal: Normal range of motion. He exhibits no edema and no tenderness.  Lymphadenopathy:    He has no cervical adenopathy.  Neurological: He is alert and oriented to person, place, and time. No cranial nerve deficit.  Skin: Skin is warm and dry. No rash noted. No erythema. No pallor.  Psychiatric: He has a normal mood and affect. His behavior is normal. Judgment and thought content normal.    ED Course  Procedures (including critical care time)  Labs Reviewed - No data to display No results found.   Bronchitis    MDM  Patient here with symptoms consistent with bronchitis - as he is a smoker I will place him on zithromax and inhaler - also started on steriods as well - he will follow up with PCP to make sure he is improved.        Izola Price N[place], [place] 11/14/11 [phone]

## 2011-11-28 ENCOUNTER — Encounter: Payer: Medicaid - Out of State | Admitting: Internal Medicine

## 2011-11-30 ENCOUNTER — Other Ambulatory Visit: Payer: Self-pay | Admitting: *Deleted

## 2011-11-30 MED ORDER — ALBUTEROL SULFATE (2.5 MG/3ML) 0.083% IN NEBU: 2.5000 mg | INHALATION_SOLUTION | Freq: Four times a day (QID) | RESPIRATORY_TRACT | Status: DC | PRN

## 2011-11-30 NOTE — Telephone Encounter (Signed)
Refill is for albuterol 0.083%  Can you change???

## 2011-12-17 ENCOUNTER — Emergency Department (HOSPITAL_COMMUNITY)
Admission: EM | Admit: 2011-12-17 | Discharge: 2011-12-17 | Disposition: A | Discharge status: Home / Self Care | Payer: Self-pay | Attending: Emergency Medicine | Admitting: Emergency Medicine

## 2011-12-17 ENCOUNTER — Encounter (HOSPITAL_COMMUNITY): Payer: Self-pay | Admitting: Emergency Medicine

## 2011-12-17 DIAGNOSIS — Z86718 Personal history of other venous thrombosis and embolism: Secondary | ICD-10-CM | POA: Insufficient documentation

## 2011-12-17 DIAGNOSIS — F17200 Nicotine dependence, unspecified, uncomplicated: Secondary | ICD-10-CM | POA: Insufficient documentation

## 2011-12-17 DIAGNOSIS — K219 Gastro-esophageal reflux disease without esophagitis: Secondary | ICD-10-CM | POA: Insufficient documentation

## 2011-12-17 DIAGNOSIS — J45909 Unspecified asthma, uncomplicated: Secondary | ICD-10-CM | POA: Insufficient documentation

## 2011-12-17 MED ORDER — AEROCHAMBER Z-STAT PLUS/MEDIUM MISC
1.0000 | Freq: Once | Status: AC
  Administered 2011-12-17: 1

## 2011-12-17 MED ORDER — ALBUTEROL SULFATE HFA 108 (90 BASE) MCG/ACT IN AERS
2.0000 | INHALATION_SPRAY | RESPIRATORY_TRACT | Status: DC | PRN
  Administered 2011-12-17: 2 via RESPIRATORY_TRACT
  Filled 2011-12-17 (×2): qty 6.7

## 2011-12-17 MED ORDER — AEROCHAMBER PLUS W/MASK MISC
Status: AC
  Filled 2011-12-17: qty 1

## 2011-12-17 MED ORDER — PREDNISONE 10 MG PO TABS: ORAL_TABLET | ORAL | Status: DC

## 2011-12-17 MED ORDER — PREDNISONE 20 MG PO TABS
60.0000 mg | ORAL_TABLET | Freq: Once | ORAL | Status: AC
  Administered 2011-12-17: 60 mg via ORAL
  Filled 2011-12-17: qty 3

## 2011-12-17 NOTE — Discharge Instructions (Signed)
Asthma Attack Prevention HOW CAN ASTHMA BE PREVENTED? Currently, there is no way to prevent asthma from starting. However, you can take steps to control the disease and prevent its symptoms after you have been diagnosed. Learn about your asthma and how to control it. Take an active role to control your asthma by working with your caregiver to create and follow an asthma action plan. An asthma action plan guides you in taking your medicines properly, avoiding factors that make your asthma worse, tracking your level of asthma control, responding to worsening asthma, and seeking emergency care when needed. To track your asthma, keep records of your symptoms, check your peak flow number using a peak flow meter (handheld device that shows how well air moves out of your lungs), and get regular asthma checkups.  Other ways to prevent asthma attacks include:  Use medicines as your caregiver directs.   Identify and avoid things that make your asthma worse (as much as you can).   Keep track of your asthma symptoms and level of control.   Get regular checkups for your asthma.   With your caregiver, write a detailed plan for taking medicines and managing an asthma attack. Then be sure to follow your action plan. Asthma is an ongoing condition that needs regular monitoring and treatment.   Identify and avoid asthma triggers. A number of outdoor allergens and irritants (pollen, mold, cold air, air pollution) can trigger asthma attacks. Find out what causes or makes your asthma worse, and take steps to avoid those triggers (see below).   Monitor your breathing. Learn to recognize warning signs of an attack, such as slight coughing, wheezing or shortness of breath. However, your lung function may already decrease before you notice any signs or symptoms, so regularly measure and record your peak airflow with a home peak flow meter.   Identify and treat attacks early. If you act quickly, you're less likely to have  a severe attack. You will also need less medicine to control your symptoms. When your peak flow measurements decrease and alert you to an upcoming attack, take your medicine as instructed, and immediately stop any activity that may have triggered the attack. If your symptoms do not improve, get medical help.   Pay attention to increasing quick-relief inhaler use. If you find yourself relying on your quick-relief inhaler (such as albuterol), your asthma is not under control. See your caregiver about adjusting your treatment.  IDENTIFY AND CONTROL FACTORS THAT MAKE YOUR ASTHMA WORSE A number of common things can set off or make your asthma symptoms worse (asthma triggers). Keep track of your asthma symptoms for several weeks, detailing all the environmental and emotional factors that are linked with your asthma. When you have an asthma attack, go back to your asthma diary to see which factor, or combination of factors, might have contributed to it. Once you know what these factors are, you can take steps to control many of them.  Allergies: If you have allergies and asthma, it is important to take asthma prevention steps at home. Asthma attacks (worsening of asthma symptoms) can be triggered by allergies, which can cause temporary increased inflammation of your airways. Minimizing contact with the substance to which you are allergic will help prevent an asthma attack. Animal Dander:   Some people are allergic to the flakes of skin or dried saliva from animals with fur or feathers. Keep these pets out of your home.   If you can't keep a pet outdoors, keep the   pet out of your bedroom and other sleeping areas at all times, and keep the door closed.   Remove carpets and furniture covered with cloth from your home. If that is not possible, keep the pet away from fabric-covered furniture and carpets.  Dust Mites:  Many people with asthma are allergic to dust mites. Dust mites are tiny bugs that are found in  every home, in mattresses, pillows, carpets, fabric-covered furniture, bedcovers, clothes, stuffed toys, fabric, and other fabric-covered items.   Cover your mattress in a special dust-proof cover.   Cover your pillow in a special dust-proof cover, or wash the pillow each week in hot water. Water must be hotter than 130 F to kill dust mites. Cold or warm water used with detergent and bleach can also be effective.   Wash the sheets and blankets on your bed each week in hot water.   Try not to sleep or lie on cloth-covered cushions.   Call ahead when traveling and ask for a smoke-free hotel room. Bring your own bedding and pillows, in case the hotel only supplies feather pillows and down comforters, which may contain dust mites and cause asthma symptoms.   Remove carpets from your bedroom and those laid on concrete, if you can.   Keep stuffed toys out of the bed, or wash the toys weekly in hot water or cooler water with detergent and bleach.  Cockroaches:  Many people with asthma are allergic to the droppings and remains of cockroaches.   Keep food and garbage in closed containers. Never leave food out.   Use poison baits, traps, powders, gels, or paste (for example, boric acid).   If a spray is used to kill cockroaches, stay out of the room until the odor goes away.  Indoor Mold:  Fix leaky faucets, pipes, or other sources of water that have mold around them.   Clean moldy surfaces with a cleaner that has bleach in it.  Pollen and Outdoor Mold:  When pollen or mold spore counts are high, try to keep your windows closed.   Stay indoors with windows closed from late morning to afternoon, if you can. Pollen and some mold spore counts are highest at that time.   Ask your caregiver whether you need to take or increase anti-inflammatory medicine before your allergy season starts.  Irritants:   Tobacco smoke is an irritant. If you smoke, ask your caregiver how you can quit. Ask family  members to quit smoking, too. Do not allow smoking in your home or car.   If possible, do not use a wood-burning stove, kerosene heater, or fireplace. Minimize exposure to all sources of smoke, including incense, candles, fires, and fireworks.   Try to stay away from strong odors and sprays, such as perfume, talcum powder, hair spray, and paints.   Decrease humidity in your home and use an indoor air cleaning device. Reduce indoor humidity to below 60 percent. Dehumidifiers or central air conditioners can do this.   Try to have someone else vacuum for you once or twice a week, if you can. Stay out of rooms while they are being vacuumed and for a short while afterward.   If you vacuum, use a dust mask from a hardware store, a double-layered or microfilter vacuum cleaner bag, or a vacuum cleaner with a HEPA filter.   Sulfites in foods and beverages can be irritants. Do not drink beer or wine, or eat dried fruit, processed potatoes, or shrimp if they cause asthma   symptoms.   Cold air can trigger an asthma attack. Cover your nose and mouth with a scarf on cold or windy days.   Several health conditions can make asthma more difficult to manage, including runny nose, sinus infections, reflux disease, psychological stress, and sleep apnea. Your caregiver will treat these conditions, as well.   Avoid close contact with people who have a cold or the flu, since your asthma symptoms may get worse if you catch the infection from them. Wash your hands thoroughly after touching items that may have been handled by people with a respiratory infection.   Get a flu shot every year to protect against the flu virus, which often makes asthma worse for days or weeks. Also get a pneumonia shot once every five to 10 years.  Drugs:  Aspirin and other painkillers can cause asthma attacks. 10% to 20% of people with asthma have sensitivity to aspirin or a group of painkillers called non-steroidal anti-inflammatory drugs  (NSAIDS), such as ibuprofen and naproxen. These drugs are used to treat pain and reduce fevers. Asthma attacks caused by any of these medicines can be severe and even fatal. These drugs must be avoided in people who have known aspirin sensitive asthma. Products with acetaminophen are considered safe for people who have asthma. It is important that people with aspirin sensitivity read labels of all over-the-counter drugs used to treat pain, colds, coughs, and fever.   Beta blockers and ACE inhibitors are other drugs which you should discuss with your caregiver, in relation to your asthma.  ALLERGY SKIN TESTING  Ask your asthma caregiver about allergy skin testing or blood testing (RAST test) to identify the allergens to which you are sensitive. If you are found to have allergies, allergy shots (immunotherapy) for asthma may help prevent future allergies and asthma. With allergy shots, small doses of allergens (substances to which you are allergic) are injected under your skin on a regular schedule. Over a period of time, your body may become used to the allergen and less responsive with asthma symptoms. You can also take measures to minimize your exposure to those allergens. EXERCISE  If you have exercise-induced asthma, or are planning vigorous exercise, or exercise in cold, humid, or dry environments, prevent exercise-induced asthma by following your caregiver's advice regarding asthma treatment before exercising. Document Released: 06/14/2009 Document Revised: 06/15/2011 Document Reviewed: 06/14/2009 [employer] Patient Information 2012 [employer]. 

## 2011-12-17 NOTE — ED Notes (Signed)
C/o productive cough with clear sputum x 1 month that has been worse over the last 2 weeks.  Also c/o sore throat.  Using inhalers at home.

## 2011-12-17 NOTE — ED Provider Notes (Signed)
History   This chart was scribed for [name] Melter, MD scribed by Magnus Sinning. The patient was seen in room STRE5/STRE5 seen at 17:51.    CSN: [id number]  Arrival date & time 12/17/11  1658   First MD Initiated Contact with Patient 12/17/11 1747      Chief Complaint  Patient presents with  . Cough  . Sore Throat    (Consider location/radiation/quality/duration/timing/severity/associated sxs/prior treatment) HPI [name] [name] is a 46 y.o. male who presents to the Emergency Department complaining of constant moderate cough with associated SOB and mild CP induced by cough, onset 2 weeks.Patient states that he has used a 45 min 2.5 mg albuterol nebulizer 8-10 times daily every 2-3 hours for approximately 2 weeks. States he only has been using the nebulizer and that he use to use the albuterol fast acting inhaler, but reports that it ran out. Patient also has hx of DVT, which he states he use to treat with Coumadin, which he discontinued use one month ago. Past Medical History  Diagnosis Date  . Asthma     hospitalized in past, questionable hx of bronchitis dx  . DVT (deep venous thrombosis)     times 2, on coumadin chronically  . Depression     hx of SI  . Substance abuse     crack, cocaine, last use 2007  . Tobacco abuse   . GERD (gastroesophageal reflux disease)   . DVT (deep venous thrombosis)   . Bronchitis   . Heart murmur   . Shortness of breath   . Tuberculosis     ' I test Positive "    Past Surgical History  Procedure Date  . Skin graft     No family history on file.  History  Substance Use Topics  . Smoking status: Current Everyday Smoker -- 0.1 packs/day for 23 years    Types: Cigarettes  . Smokeless tobacco: Former Neurosurgeon  . Alcohol Use: Yes     OCCASIONAL      Review of Systems 10 Systems reviewed and are negative for acute change except as noted in the HPI. Allergies  Review of patient's allergies indicates no known allergies.  Home Medications    Current Outpatient Rx  Name Route Sig Dispense Refill  . ALBUTEROL SULFATE HFA 108 (90 BASE) MCG/ACT IN AERS Inhalation Inhale 2 puffs into the lungs every 6 (six) hours as needed. For breathing 1 Inhaler 6  . ALBUTEROL SULFATE (2.5 MG/3ML) 0.083% IN NEBU Nebulization Take 2.5 mg by nebulization every 6 (six) hours as needed.    Marland Kitchen FLUTICASONE-SALMETEROL 250-50 MCG/DOSE IN AEPB Inhalation Inhale 1 puff into the lungs 2 (two) times daily. For asthma 1 each 5  . LORATADINE 10 MG PO TABS Oral Take 1 tablet (10 mg total) by mouth daily. 30 tablet 0  . OMEPRAZOLE 40 MG PO CPDR Oral Take 1 capsule (40 mg total) by mouth daily. 30 capsule 0  . WARFARIN SODIUM 5 MG PO TABS  Take as directed by anticoagulation clinic provider. Currently taking 3x5mg  warfarin daily. 90 tablet 3    This is a 1 month supply  . ENOXAPARIN SODIUM 150 MG/ML [place] SOLN Subcutaneous Inject 1 mL (150 mg total) into the skin every 12 (twelve) hours. 4 mL 0  . PREDNISONE 10 MG PO TABS  Take q day 6,5,4,3,2,1 21 tablet 0    BP 116/77  Pulse 79  Temp(Src) 98 F (36.7 C) (Oral)  Resp 18  SpO2 96%  Physical Exam  Nursing note and vitals reviewed. Constitutional: He is oriented to person, place, and time. He appears well-developed and well-nourished. No distress.  HENT:  Head: Normocephalic and atraumatic.  Right Ear: Tympanic membrane normal.  Left Ear: Tympanic membrane normal.  Eyes: EOM are normal.  Neck: Neck supple. No tracheal deviation present.  Cardiovascular: Normal rate, regular rhythm and normal heart sounds.   No murmur heard. Pulmonary/Chest: Effort normal. No respiratory distress. He has no wheezes. He has no rales.       Lungs have good air movement. No rhonchi  Musculoskeletal: Normal range of motion.  Lymphadenopathy:    Cervical adenopathy: em.  Neurological: He is alert and oriented to person, place, and time.  Skin: Skin is warm and dry.  Psychiatric: He has a normal mood and affect. His behavior is  normal.    ED Course  Procedures (including critical care time) DIAGNOSTIC STUDIES: Oxygen Saturation is 96% on room air, normal by my interpretation.    COORDINATION OF CARE:  Labs Reviewed - No data to display No results found.   1. Asthma       MDM  Exacerbation of chronic asthma with essentially normal physical exam, and vital signs. Doubt PE, pneumonia, or pneumothorax. Doubt metabolic instability, serious bacterial infection or impending vascular collapse; the patient is stable for discharge.    I personally performed the services described in this documentation, which was scribed in my presence. The recorded information has been reviewed and considered.    Plan: Home Medications- Inhaler to go, Prednisone Taper; Home Treatments- rest; Recommended follow up- PCP prn        [name] Melter, MD 12/17/11 1842

## 2012-04-03 ENCOUNTER — Encounter (HOSPITAL_COMMUNITY): Payer: Self-pay | Admitting: Family Medicine

## 2012-04-03 ENCOUNTER — Emergency Department (HOSPITAL_COMMUNITY): Payer: Self-pay

## 2012-04-03 ENCOUNTER — Emergency Department (HOSPITAL_COMMUNITY)
Admission: EM | Admit: 2012-04-03 | Discharge: 2012-04-03 | Disposition: A | Discharge status: Home / Self Care | Payer: Self-pay | Attending: Emergency Medicine | Admitting: Emergency Medicine

## 2012-04-03 DIAGNOSIS — J40 Bronchitis, not specified as acute or chronic: Secondary | ICD-10-CM | POA: Insufficient documentation

## 2012-04-03 DIAGNOSIS — F17200 Nicotine dependence, unspecified, uncomplicated: Secondary | ICD-10-CM | POA: Insufficient documentation

## 2012-04-03 DIAGNOSIS — Z86718 Personal history of other venous thrombosis and embolism: Secondary | ICD-10-CM | POA: Insufficient documentation

## 2012-04-03 DIAGNOSIS — J45909 Unspecified asthma, uncomplicated: Secondary | ICD-10-CM | POA: Insufficient documentation

## 2012-04-03 DIAGNOSIS — Z79899 Other long term (current) drug therapy: Secondary | ICD-10-CM | POA: Insufficient documentation

## 2012-04-03 LAB — BASIC METABOLIC PANEL
BUN: 9 mg/dL (ref 6–23)
Chloride: 100 mEq/L (ref 96–112)
GFR calc Af Amer: 89 mL/min — ABNORMAL LOW (ref >90)
GFR calc non Af Amer: 77 mL/min — ABNORMAL LOW (ref >90)
Glucose, Bld: 76 mg/dL (ref 70–99)
Potassium: 4 mEq/L (ref 3.5–5.1)
Sodium: 140 mEq/L (ref 135–145)

## 2012-04-03 LAB — CBC WITH DIFFERENTIAL/PLATELET
HCT: 48 % (ref 39.0–52.0)
Hemoglobin: 17 g/dL (ref 13.0–17.0)
Lymphs Abs: 2.6 10*3/uL (ref 0.7–4.0)
MCHC: 35.4 g/dL (ref 30.0–36.0)
Monocytes Absolute: 0.7 10*3/uL (ref 0.1–1.0)
Monocytes Relative: 8 % (ref 3–12)
Neutro Abs: 4.6 10*3/uL (ref 1.7–7.7)
Neutrophils Relative %: 58 % (ref 43–77)
RBC: 5.17 MIL/uL (ref 4.22–5.81)

## 2012-04-03 LAB — PROTIME-INR: INR: 1.11 (ref 0.00–1.49)

## 2012-04-03 LAB — RAPID STREP SCREEN (MED CTR MEBANE ONLY): Streptococcus, Group A Screen (Direct): NEGATIVE

## 2012-04-03 MED ORDER — FAMOTIDINE 20 MG PO TABS
20.0000 mg | ORAL_TABLET | Freq: Once | ORAL | Status: AC
  Administered 2012-04-03: 20 mg via ORAL
  Filled 2012-04-03: qty 1

## 2012-04-03 MED ORDER — ALBUTEROL SULFATE HFA 108 (90 BASE) MCG/ACT IN AERS: 2.0000 | INHALATION_SPRAY | RESPIRATORY_TRACT | Status: DC | PRN

## 2012-04-03 MED ORDER — IPRATROPIUM BROMIDE 0.02 % IN SOLN
0.5000 mg | Freq: Once | RESPIRATORY_TRACT | Status: AC
  Administered 2012-04-03: 0.5 mg via RESPIRATORY_TRACT
  Filled 2012-04-03: qty 2.5

## 2012-04-03 MED ORDER — ALBUTEROL SULFATE (5 MG/ML) 0.5% IN NEBU
5.0000 mg | INHALATION_SOLUTION | Freq: Once | RESPIRATORY_TRACT | Status: DC
  Filled 2012-04-03: qty 1

## 2012-04-03 MED ORDER — GI COCKTAIL ~~LOC~~
30.0000 mL | Freq: Once | ORAL | Status: AC
  Administered 2012-04-03: 30 mL via ORAL
  Filled 2012-04-03: qty 30

## 2012-04-03 MED ORDER — PREDNISONE 50 MG PO TABS: ORAL_TABLET | ORAL | Status: DC

## 2012-04-03 MED ORDER — IOHEXOL 350 MG/ML SOLN
80.0000 mL | Freq: Once | INTRAVENOUS | Status: AC | PRN
  Administered 2012-04-03: 80 mL via INTRAVENOUS

## 2012-04-03 MED ORDER — DOXYCYCLINE HYCLATE 100 MG PO CAPS: 100.0000 mg | ORAL_CAPSULE | Freq: Two times a day (BID) | ORAL | Status: DC

## 2012-04-03 MED ORDER — ALBUTEROL SULFATE (5 MG/ML) 0.5% IN NEBU
5.0000 mg | INHALATION_SOLUTION | Freq: Once | RESPIRATORY_TRACT | Status: AC
  Administered 2012-04-03: 5 mg via RESPIRATORY_TRACT

## 2012-04-03 MED ORDER — PREDNISONE 20 MG PO TABS
60.0000 mg | ORAL_TABLET | Freq: Once | ORAL | Status: AC
  Administered 2012-04-03: 60 mg via ORAL
  Filled 2012-04-03: qty 3

## 2012-04-03 NOTE — ED Notes (Signed)
Pt d/c home in NAD. PT voiced understanding of d/c instructions and follow up care.

## 2012-04-03 NOTE — ED Provider Notes (Signed)
[name] [name] S 8:00 PM patient discussed in sign out with Dr. Lajean Saver.  Patient coming in with productive cough chest tightness and shortness of breath symptoms. Patient does have prior history of DVT and PE was a poor Coumadin compliance. Clinically bronchitis is suspected however patient having CT and your chest to rule out PE.   CT and your chest is not show any PE. At this time will treat for bronchitis type symptoms. Patient instructed to followup with PCP and to take medications as prescribed.  Angus Seller, [place] 04/03/12 2204

## 2012-04-03 NOTE — ED Notes (Signed)
Pt sts sore throat, wheezing, SOB. Pt can hardly talk. Voice hoarse.

## 2012-04-03 NOTE — ED Provider Notes (Signed)
History  This chart was scribed for Glynn Octave, MD by Ardeen Jourdain. This patient was seen in room TR05C/TR05C and the patient's care was started at 1822.  CSN: [id number]  Arrival date & time 04/03/12  1720   First MD Initiated Contact with Patient 04/03/12 1822      Chief Complaint  Patient presents with  . Shortness of Breath  . Wheezing  . Sore Throat     HPI  [name] [name] is a 46 y.o. male who presents to the Emergency Department complaining of sore throat with associated wheezing, hoarse voice and dizziness that started this morning. He states he has pain on his right side of his chest, non-productive cough and pain swallowing. Pt denies fever, neck pain, visual disturbance, CP, SOB, abdominal pain, nausea, emesis, diarrhea, urinary symptoms, back pain, HA, weakness, numbness and rash as associated symptoms. He denies any sick contact. He has a h/o DVT, asthma and substance abuse. He reports being prescribed Coumadin for his DVT, but has not been taking it for the past three weeks.  He is a current everyday smoker and uses alcohol occasionally.    Past Medical History  Diagnosis Date  . Asthma     hospitalized in past, questionable hx of bronchitis dx  . DVT (deep venous thrombosis)     times 2, on coumadin chronically  . Depression     hx of SI  . Substance abuse     crack, cocaine, last use 2007  . Tobacco abuse   . GERD (gastroesophageal reflux disease)   . DVT (deep venous thrombosis)   . Bronchitis   . Heart murmur   . Shortness of breath   . Tuberculosis     ' I test Positive "    Past Surgical History  Procedure Date  . Skin graft     History reviewed. No pertinent family history.  History  Substance Use Topics  . Smoking status: Current Every Day Smoker -- 0.1 packs/day for 23 years    Types: Cigarettes  . Smokeless tobacco: Former Neurosurgeon  . Alcohol Use: Yes     OCCASIONAL      Review of Systems  A complete 10 system review of systems  was obtained and all systems are negative except as noted in the HPI and PMH.    Allergies  Review of patient's allergies indicates no known allergies.  Home Medications   Current Outpatient Rx  Name Route Sig Dispense Refill  . ALBUTEROL SULFATE HFA 108 (90 BASE) MCG/ACT IN AERS Inhalation Inhale 2 puffs into the lungs every 6 (six) hours as needed. For breathing 1 Inhaler 6  . ALBUTEROL SULFATE (2.5 MG/3ML) 0.083% IN NEBU Nebulization Take 2.5 mg by nebulization every 6 (six) hours as needed.    Marland Kitchen FLUTICASONE-SALMETEROL 250-50 MCG/DOSE IN AEPB Inhalation Inhale 1 puff into the lungs 2 (two) times daily. For asthma 1 each 5  . LORATADINE 10 MG PO TABS Oral Take 1 tablet (10 mg total) by mouth daily. 30 tablet 0  . OMEPRAZOLE 40 MG PO CPDR Oral Take 1 capsule (40 mg total) by mouth daily. 30 capsule 0  . PREDNISONE 10 MG PO TABS  Take q day 6,5,4,3,2,1 21 tablet 0  . WARFARIN SODIUM 5 MG PO TABS  Take as directed by anticoagulation clinic provider. Currently taking 3x5mg  warfarin daily. 90 tablet 3    This is a 1 month supply    Triage Vitals: BP 112/88  Pulse 113  Temp  98.5 F (36.9 C) (Oral)  Resp 18  SpO2 100%  Physical Exam  Nursing note and vitals reviewed. Constitutional: He is oriented to person, place, and time. He appears well-developed and well-nourished. No distress.  HENT:  Head: Normocephalic and atraumatic.  Mouth/Throat: Oropharynx is clear and moist. No oropharyngeal exudate.  Eyes: EOM are normal.  Neck: Normal range of motion. Neck supple. No tracheal deviation present.  Cardiovascular: Normal rate, regular rhythm and normal heart sounds.   Pulmonary/Chest: Effort normal. No respiratory distress. He exhibits tenderness.       Coarse breath sounds, hoarse voice, tenderness on left side of chest wall  Abdominal: Soft. Bowel sounds are normal. There is no tenderness.  Musculoskeletal: Normal range of motion.  Neurological: He is alert and oriented to person,  place, and time.  Skin: Skin is warm and dry.  Psychiatric: He has a normal mood and affect. His behavior is normal.    ED Course  Procedures (including critical care time)  DIAGNOSTIC STUDIES: Oxygen Saturation is 100% on room air, normal by my interpretation.    COORDINATION OF CARE:  1830- Discussed treatment plan with pt at bedside and pt agreed to plan. A CXR and chest CT were both ordered.   1845- Medication Order- albuterol (PROVENTIL) (5 MG/ML) 0.5% nebulizer solution 5 mg once; ipratropium (ATROVENT) nebulizer solution 0.5 mg once; predniSONE (DELTASONE) tablet 60 mg once   Results for orders placed during the hospital encounter of 04/03/12  RAPID STREP SCREEN      Component Value Range   Streptococcus, Group A Screen (Direct) NEGATIVE  NEGATIVE  BASIC METABOLIC PANEL      Component Value Range   Sodium 140  135 - 145 mEq/L   Potassium 4.0  3.5 - 5.1 mEq/L   Chloride 100  96 - 112 mEq/L   CO2 21  19 - 32 mEq/L   Glucose, Bld 76  70 - 99 mg/dL   BUN 9  6 - 23 mg/dL   Creatinine, Ser 4.09  0.50 - 1.35 mg/dL   Calcium 81.1  8.4 - 91.4 mg/dL   GFR calc non Af Amer 77 (*) >90 mL/min   GFR calc Af Amer 89 (*) >90 mL/min  CBC WITH DIFFERENTIAL      Component Value Range   WBC 8.0  4.0 - 10.5 K/uL   RBC 5.17  4.22 - 5.81 MIL/uL   Hemoglobin 17.0  13.0 - 17.0 g/dL   HCT 78.2  95.6 - 21.3 %   MCV 92.8  78.0 - 100.0 fL   MCH 32.9  26.0 - 34.0 pg   MCHC 35.4  30.0 - 36.0 g/dL   RDW 08.6  57.8 - 46.9 %   Platelets 199  150 - 400 K/uL   Neutrophils Relative 58  43 - 77 %   Neutro Abs 4.6  1.7 - 7.7 K/uL   Lymphocytes Relative 32  12 - 46 %   Lymphs Abs 2.6  0.7 - 4.0 K/uL   Monocytes Relative 8  3 - 12 %   Monocytes Absolute 0.7  0.1 - 1.0 K/uL   Eosinophils Relative 1  0 - 5 %   Eosinophils Absolute 0.1  0.0 - 0.7 K/uL   Basophils Relative 1  0 - 1 %   Basophils Absolute 0.0  0.0 - 0.1 K/uL  PROTIME-INR      Component Value Range   Prothrombin Time 14.2  11.6 -  15.2 seconds   INR 1.11  0.00 - 1.49      Dg Chest 2 View  04/03/2012  *RADIOLOGY REPORT*  Clinical Data: 46 year old male wheezing shortness of breath.  Sore throat.  History of treated tuberculosis.  CHEST - 2 VIEW  Comparison: 08/15/2011 and earlier.  Findings: Better lung volumes.  Cardiac size and mediastinal contours are within normal limits.  Visualized tracheal air column is within normal limits.  No pneumothorax, pulmonary edema, pleural effusion, consolidation or acute pulmonary opacity. No acute osseous abnormality identified.  IMPRESSION: No acute cardiopulmonary abnormality.   Original Report Authenticated By: Harley Hallmark, M.D.      No diagnosis found.    MDM  2 days of cough, sore throat, SOB, chest pain, wheezing, hoarseness. Hx DVT noncompliant with coumadin.  Coarse breath sounds bilaterally with scattered wheezing. No distress. CXR neg. Nebs, steroids.  Suspect bronchitis but will obtain CTPE given tachycardia, CP, SOB, hx DVT.      I personally performed the services described in this documentation, which was scribed in my presence.  The recorded information has been reviewed and considered.    Glynn Octave, MD 04/03/12 2024

## 2012-04-03 NOTE — ED Notes (Signed)
Pt 99% while ambulating

## 2012-04-04 NOTE — ED Provider Notes (Signed)
Medical screening examination/treatment/procedure(s) were performed by non-physician practitioner and as supervising physician I was immediately available for consultation/collaboration.   Shilpa Bushee, MD 04/04/12 0039 

## 2012-07-10 DIAGNOSIS — I2699 Other pulmonary embolism without acute cor pulmonale: Secondary | ICD-10-CM

## 2012-07-10 HISTORY — DX: Other pulmonary embolism without acute cor pulmonale: I26.99

## 2012-07-16 ENCOUNTER — Emergency Department (HOSPITAL_COMMUNITY)
Admission: EM | Admit: 2012-07-16 | Discharge: 2012-07-17 | Disposition: A | Discharge status: Home / Self Care | Payer: Self-pay | Attending: Emergency Medicine | Admitting: Emergency Medicine

## 2012-07-16 ENCOUNTER — Encounter (HOSPITAL_COMMUNITY): Payer: Self-pay | Admitting: Emergency Medicine

## 2012-07-16 DIAGNOSIS — Y939 Activity, unspecified: Secondary | ICD-10-CM | POA: Insufficient documentation

## 2012-07-16 DIAGNOSIS — W540XXA Bitten by dog, initial encounter: Secondary | ICD-10-CM | POA: Insufficient documentation

## 2012-07-16 DIAGNOSIS — S91009A Unspecified open wound, unspecified ankle, initial encounter: Secondary | ICD-10-CM | POA: Insufficient documentation

## 2012-07-16 DIAGNOSIS — Z86718 Personal history of other venous thrombosis and embolism: Secondary | ICD-10-CM | POA: Insufficient documentation

## 2012-07-16 DIAGNOSIS — IMO0002 Reserved for concepts with insufficient information to code with codable children: Secondary | ICD-10-CM | POA: Insufficient documentation

## 2012-07-16 DIAGNOSIS — F1110 Opioid abuse, uncomplicated: Secondary | ICD-10-CM | POA: Insufficient documentation

## 2012-07-16 DIAGNOSIS — Y929 Unspecified place or not applicable: Secondary | ICD-10-CM | POA: Insufficient documentation

## 2012-07-16 DIAGNOSIS — R011 Cardiac murmur, unspecified: Secondary | ICD-10-CM | POA: Insufficient documentation

## 2012-07-16 DIAGNOSIS — F329 Major depressive disorder, single episode, unspecified: Secondary | ICD-10-CM | POA: Insufficient documentation

## 2012-07-16 DIAGNOSIS — Z8709 Personal history of other diseases of the respiratory system: Secondary | ICD-10-CM | POA: Insufficient documentation

## 2012-07-16 DIAGNOSIS — Z79899 Other long term (current) drug therapy: Secondary | ICD-10-CM | POA: Insufficient documentation

## 2012-07-16 DIAGNOSIS — J45909 Unspecified asthma, uncomplicated: Secondary | ICD-10-CM | POA: Insufficient documentation

## 2012-07-16 DIAGNOSIS — F1410 Cocaine abuse, uncomplicated: Secondary | ICD-10-CM | POA: Insufficient documentation

## 2012-07-16 DIAGNOSIS — S81859A Open bite, unspecified lower leg, initial encounter: Secondary | ICD-10-CM

## 2012-07-16 DIAGNOSIS — Z8611 Personal history of tuberculosis: Secondary | ICD-10-CM | POA: Insufficient documentation

## 2012-07-16 DIAGNOSIS — S81009A Unspecified open wound, unspecified knee, initial encounter: Secondary | ICD-10-CM | POA: Insufficient documentation

## 2012-07-16 DIAGNOSIS — Z7901 Long term (current) use of anticoagulants: Secondary | ICD-10-CM | POA: Insufficient documentation

## 2012-07-16 DIAGNOSIS — F17200 Nicotine dependence, unspecified, uncomplicated: Secondary | ICD-10-CM | POA: Insufficient documentation

## 2012-07-16 DIAGNOSIS — F3289 Other specified depressive episodes: Secondary | ICD-10-CM | POA: Insufficient documentation

## 2012-07-16 DIAGNOSIS — K219 Gastro-esophageal reflux disease without esophagitis: Secondary | ICD-10-CM | POA: Insufficient documentation

## 2012-07-16 MED ORDER — AMOXICILLIN-POT CLAVULANATE 875-125 MG PO TABS: 1.0000 | ORAL_TABLET | Freq: Two times a day (BID) | ORAL | Status: DC

## 2012-07-16 MED ORDER — RABIES IMMUNE GLOBULIN 150 UNIT/ML IM INJ
2100.0000 [IU] | INJECTION | Freq: Once | INTRAMUSCULAR | Status: AC
  Administered 2012-07-16: 2100 [IU]
  Filled 2012-07-16: qty 14

## 2012-07-16 MED ORDER — HYDROCODONE-ACETAMINOPHEN 5-325 MG PO TABS: 1.0000 | ORAL_TABLET | Freq: Four times a day (QID) | ORAL | Status: DC | PRN

## 2012-07-16 MED ORDER — RABIES VACCINE, PCEC IM SUSR
1.0000 mL | Freq: Once | INTRAMUSCULAR | Status: AC
  Administered 2012-07-16: 1 mL via INTRAMUSCULAR
  Filled 2012-07-16: qty 1

## 2012-07-16 MED ORDER — TETANUS-DIPHTH-ACELL PERTUSSIS 5-2.5-18.5 LF-MCG/0.5 IM SUSP
0.5000 mL | Freq: Once | INTRAMUSCULAR | Status: AC
  Administered 2012-07-16: 0.5 mL via INTRAMUSCULAR
  Filled 2012-07-16: qty 0.5

## 2012-07-16 MED ORDER — RABIES IMMUNE GLOBULIN 150 UNIT/ML IM INJ: 20.0000 [IU]/kg | INJECTION | Freq: Once | INTRAMUSCULAR | Status: DC

## 2012-07-16 NOTE — ED Provider Notes (Signed)
History  This chart was scribed for non-physician practitioner working with Gerhard Munch, MD by Erskine Emery, ED Scribe. This patient was seen in room TR07C/TR07C and the patient's care was started at 22:10.   CSN: [id number]  Arrival date & time 07/16/12  2030   None     Chief Complaint  Patient presents with  . Animal Bite    (Consider location/radiation/quality/duration/timing/severity/associated sxs/prior treatment) The history is provided by the patient. No language interpreter was used.  [name] [name] is a 47 y.o. male who presents to the Emergency Department complaining of lacerations and puncture wounds to the left calf and shin area since being bitten by an unrecognized pit bull at 15:00 this afternoon (over 6 hours ago). Pt denies ever having seen the dog before or knowing if it was a stray or neighborhood dog. Pt does not know when his last Tetanus shot was. Pt denies talking to PD or animal control. Pt has no known drug allergies and takes no regular medications.  Past Medical History  Diagnosis Date  . Asthma     hospitalized in past, questionable hx of bronchitis dx  . DVT (deep venous thrombosis)     times 2, on coumadin chronically  . Depression     hx of SI  . Substance abuse     crack, cocaine, last use 2007  . Tobacco abuse   . GERD (gastroesophageal reflux disease)   . DVT (deep venous thrombosis)   . Bronchitis   . Heart murmur   . Shortness of breath   . Tuberculosis     ' I test Positive "    Past Surgical History  Procedure Date  . Skin graft     History reviewed. No pertinent family history.  History  Substance Use Topics  . Smoking status: Current Every Day Smoker -- 0.1 packs/day for 23 years    Types: Cigarettes  . Smokeless tobacco: Former Neurosurgeon  . Alcohol Use: Yes     Comment: OCCASIONAL      Review of Systems  Constitutional: Negative for fever and chills.  HENT: Negative for congestion.   Eyes: Negative for visual  disturbance.  Respiratory: Negative for shortness of breath.   Skin: Positive for wound.       Lacerations and puncture wounds to the left shin and calf  All other systems reviewed and are negative.    Allergies  Review of patient's allergies indicates no known allergies.  Home Medications   Current Outpatient Rx  Name  Route  Sig  Dispense  Refill  . ALBUTEROL SULFATE HFA 108 (90 BASE) MCG/ACT IN AERS   Inhalation   Inhale 2 puffs into the lungs every 6 (six) hours as needed. For breathing   1 Inhaler   6   . ALBUTEROL SULFATE HFA 108 (90 BASE) MCG/ACT IN AERS   Inhalation   Inhale 2 puffs into the lungs every 4 (four) hours as needed for wheezing.   1 Inhaler   0   . ALBUTEROL SULFATE (2.5 MG/3ML) 0.083% IN NEBU   Nebulization   Take 2.5 mg by nebulization every 6 (six) hours as needed. For shortness of breath or wheezing         . DOXYCYCLINE HYCLATE 100 MG PO CAPS   Oral   Take 1 capsule (100 mg total) by mouth 2 (two) times daily.   20 capsule   0   . LORATADINE 10 MG PO TABS   Oral   Take  1 tablet (10 mg total) by mouth daily.   30 tablet   0   . PREDNISONE 50 MG PO TABS      1 tablet PO daily   5 tablet   0   . WARFARIN SODIUM 5 MG PO TABS   Oral   Take 12.5 mg by mouth daily.           Triage Vitals: BP 135/92  Pulse 87  Temp 97.6 F (36.4 C) (Oral)  Resp 18  SpO2 99%  Physical Exam  Nursing note and vitals reviewed. Constitutional: He is oriented to person, place, and time. He appears well-developed and well-nourished. No distress.  HENT:  Head: Normocephalic and atraumatic.  Eyes: EOM are normal. Pupils are equal, round, and reactive to light.  Neck: Neck supple. No tracheal deviation present.  Cardiovascular: Normal rate.   Pulmonary/Chest: Effort normal. No respiratory distress.  Abdominal: Soft. He exhibits no distension.  Musculoskeletal: Normal range of motion. He exhibits no edema.       Legs: Neurological: He is alert and  oriented to person, place, and time.  Skin: Skin is warm and dry.  Psychiatric: He has a normal mood and affect.    ED Course  Procedures (including critical care time) DIAGNOSTIC STUDIES: Oxygen Saturation is 99% on room air, normal by my interpretation.    COORDINATION OF CARE: 22:10--I evaluated the patient and we discussed a treatment plan including Tetanus shot and possibly rabies shot to which the pt agreed.    Labs Reviewed - No data to display No results found.   No diagnosis found.  Discussed with Dr. Jeraldine Loots.  Dog bite to left lower leg.  Wound care, rabies prophylaxis, antibiotics.  Follow-up with [facility] for continuation of vaccine administration (days 3, 7, 14)..  MDM    I personally performed the services described in this documentation, which was scribed in my presence. The recorded information has been reviewed and is accurate.       Jimmye Norman, NP 07/16/12 2351

## 2012-07-16 NOTE — ED Notes (Signed)
Patient reports that he was bit on his left leg by a dog (pit bull) around 1500 this afternoon.  Patient reports that he does not know the dog; unsure whether stray or neighborhood dog.  Lacerations/punture wounds noted to left calf/shin area and calf area.

## 2012-07-17 NOTE — ED Provider Notes (Signed)
Medical screening examination/treatment/procedure(s) were performed by non-physician practitioner and as supervising physician I was immediately available for consultation/collaboration.  Dowell Hoon, MD 07/17/12 0002 

## 2012-12-19 ENCOUNTER — Inpatient Hospital Stay (HOSPITAL_COMMUNITY)
Admission: EM | Admit: 2012-12-19 | Discharge: 2012-12-22 | DRG: 203 | Disposition: A | Discharge status: Home / Self Care | Payer: 59 | Attending: Internal Medicine | Admitting: Internal Medicine

## 2012-12-19 ENCOUNTER — Inpatient Hospital Stay (HOSPITAL_COMMUNITY): Payer: 59

## 2012-12-19 ENCOUNTER — Encounter (HOSPITAL_COMMUNITY): Payer: Self-pay | Admitting: Internal Medicine

## 2012-12-19 ENCOUNTER — Emergency Department (HOSPITAL_COMMUNITY): Payer: 59

## 2012-12-19 DIAGNOSIS — Z91199 Patient's noncompliance with other medical treatment and regimen due to unspecified reason: Secondary | ICD-10-CM

## 2012-12-19 DIAGNOSIS — R05 Cough: Secondary | ICD-10-CM

## 2012-12-19 DIAGNOSIS — J069 Acute upper respiratory infection, unspecified: Secondary | ICD-10-CM

## 2012-12-19 DIAGNOSIS — R079 Chest pain, unspecified: Secondary | ICD-10-CM | POA: Diagnosis present

## 2012-12-19 DIAGNOSIS — Z7901 Long term (current) use of anticoagulants: Secondary | ICD-10-CM

## 2012-12-19 DIAGNOSIS — Z79899 Other long term (current) drug therapy: Secondary | ICD-10-CM

## 2012-12-19 DIAGNOSIS — Z86718 Personal history of other venous thrombosis and embolism: Secondary | ICD-10-CM

## 2012-12-19 DIAGNOSIS — J4531 Mild persistent asthma with (acute) exacerbation: Secondary | ICD-10-CM

## 2012-12-19 DIAGNOSIS — K219 Gastro-esophageal reflux disease without esophagitis: Secondary | ICD-10-CM | POA: Diagnosis present

## 2012-12-19 DIAGNOSIS — J4540 Moderate persistent asthma, uncomplicated: Secondary | ICD-10-CM | POA: Diagnosis present

## 2012-12-19 DIAGNOSIS — Z8611 Personal history of tuberculosis: Secondary | ICD-10-CM

## 2012-12-19 DIAGNOSIS — J45901 Unspecified asthma with (acute) exacerbation: Principal | ICD-10-CM

## 2012-12-19 DIAGNOSIS — I82501 Chronic embolism and thrombosis of unspecified deep veins of right lower extremity: Secondary | ICD-10-CM | POA: Diagnosis present

## 2012-12-19 DIAGNOSIS — F329 Major depressive disorder, single episode, unspecified: Secondary | ICD-10-CM | POA: Diagnosis present

## 2012-12-19 DIAGNOSIS — R011 Cardiac murmur, unspecified: Secondary | ICD-10-CM

## 2012-12-19 DIAGNOSIS — F1410 Cocaine abuse, uncomplicated: Secondary | ICD-10-CM | POA: Diagnosis present

## 2012-12-19 DIAGNOSIS — IMO0002 Reserved for concepts with insufficient information to code with codable children: Secondary | ICD-10-CM

## 2012-12-19 DIAGNOSIS — F17200 Nicotine dependence, unspecified, uncomplicated: Secondary | ICD-10-CM | POA: Diagnosis present

## 2012-12-19 DIAGNOSIS — Z9119 Patient's noncompliance with other medical treatment and regimen: Secondary | ICD-10-CM

## 2012-12-19 DIAGNOSIS — F3289 Other specified depressive episodes: Secondary | ICD-10-CM | POA: Diagnosis present

## 2012-12-19 DIAGNOSIS — R0602 Shortness of breath: Secondary | ICD-10-CM

## 2012-12-19 DIAGNOSIS — I82509 Chronic embolism and thrombosis of unspecified deep veins of unspecified lower extremity: Secondary | ICD-10-CM

## 2012-12-19 LAB — BASIC METABOLIC PANEL
BUN: 9 mg/dL (ref 6–23)
CO2: 28 mEq/L (ref 19–32)
Chloride: 99 mEq/L (ref 96–112)
Creatinine, Ser: 1.02 mg/dL (ref 0.50–1.35)
Glucose, Bld: 126 mg/dL — ABNORMAL HIGH (ref 70–99)

## 2012-12-19 LAB — CBC WITH DIFFERENTIAL/PLATELET
Basophils Absolute: 0 10*3/uL (ref 0.0–0.1)
Eosinophils Relative: 0 % (ref 0–5)
HCT: 46.4 % (ref 39.0–52.0)
Hemoglobin: 16.6 g/dL (ref 13.0–17.0)
Lymphocytes Relative: 11 % — ABNORMAL LOW (ref 12–46)
Lymphs Abs: 1.4 10*3/uL (ref 0.7–4.0)
MCV: 95.1 fL (ref 78.0–100.0)
Monocytes Absolute: 0.8 10*3/uL (ref 0.1–1.0)
Monocytes Relative: 6 % (ref 3–12)
Neutro Abs: 11 10*3/uL — ABNORMAL HIGH (ref 1.7–7.7)
RBC: 4.88 MIL/uL (ref 4.22–5.81)
WBC: 13.3 10*3/uL — ABNORMAL HIGH (ref 4.0–10.5)

## 2012-12-19 LAB — TROPONIN I
Troponin I: <0.3 ng/mL (ref <0.30)
Troponin I: <0.3 ng/mL (ref <0.30)
Troponin I: <0.3 ng/mL (ref <0.30)

## 2012-12-19 LAB — PROTIME-INR
INR: 1.07 (ref 0.00–1.49)
Prothrombin Time: 13.8 seconds (ref 11.6–15.2)

## 2012-12-19 LAB — MAGNESIUM: Magnesium: 2.3 mg/dL (ref 1.5–2.5)

## 2012-12-19 MED ORDER — PREDNISONE 50 MG PO TABS
60.0000 mg | ORAL_TABLET | Freq: Every day | ORAL | Status: DC
  Administered 2012-12-20 – 2012-12-22 (×3): 60 mg via ORAL
  Filled 2012-12-19 (×4): qty 1

## 2012-12-19 MED ORDER — ALBUTEROL SULFATE (5 MG/ML) 0.5% IN NEBU
2.5000 mg | INHALATION_SOLUTION | RESPIRATORY_TRACT | Status: DC | PRN
  Filled 2012-12-19 (×2): qty 0.5

## 2012-12-19 MED ORDER — IPRATROPIUM BROMIDE 0.02 % IN SOLN
0.5000 mg | Freq: Once | RESPIRATORY_TRACT | Status: AC
  Administered 2012-12-19: 0.5 mg via RESPIRATORY_TRACT
  Filled 2012-12-19: qty 2.5

## 2012-12-19 MED ORDER — MORPHINE SULFATE 2 MG/ML IJ SOLN: 2.0000 mg | INTRAMUSCULAR | Status: DC | PRN

## 2012-12-19 MED ORDER — ONDANSETRON HCL 4 MG/2ML IJ SOLN: 4.0000 mg | Freq: Four times a day (QID) | INTRAMUSCULAR | Status: DC | PRN

## 2012-12-19 MED ORDER — PREDNISONE 20 MG PO TABS
60.0000 mg | ORAL_TABLET | Freq: Once | ORAL | Status: AC
  Administered 2012-12-19: 60 mg via ORAL
  Filled 2012-12-19: qty 3

## 2012-12-19 MED ORDER — WARFARIN SODIUM 7.5 MG PO TABS
15.0000 mg | ORAL_TABLET | Freq: Once | ORAL | Status: AC
  Administered 2012-12-19: 15 mg via ORAL
  Filled 2012-12-19: qty 2

## 2012-12-19 MED ORDER — MAGNESIUM SULFATE 40 MG/ML IJ SOLN
2.0000 g | Freq: Once | INTRAMUSCULAR | Status: AC
  Administered 2012-12-19: 2 g via INTRAVENOUS
  Filled 2012-12-19: qty 50

## 2012-12-19 MED ORDER — ACETAMINOPHEN 325 MG PO TABS
650.0000 mg | ORAL_TABLET | Freq: Four times a day (QID) | ORAL | Status: DC | PRN
  Administered 2012-12-19: 650 mg via ORAL
  Filled 2012-12-19: qty 2

## 2012-12-19 MED ORDER — PNEUMOCOCCAL VAC POLYVALENT 25 MCG/0.5ML IJ INJ
0.5000 mL | INJECTION | INTRAMUSCULAR | Status: AC
  Administered 2012-12-20: 0.5 mL via INTRAMUSCULAR
  Filled 2012-12-19: qty 0.5

## 2012-12-19 MED ORDER — ONDANSETRON HCL 4 MG PO TABS: 4.0000 mg | ORAL_TABLET | Freq: Four times a day (QID) | ORAL | Status: DC | PRN

## 2012-12-19 MED ORDER — ALBUTEROL SULFATE (5 MG/ML) 0.5% IN NEBU
2.5000 mg | INHALATION_SOLUTION | Freq: Four times a day (QID) | RESPIRATORY_TRACT | Status: DC
  Administered 2012-12-19 – 2012-12-21 (×8): 2.5 mg via RESPIRATORY_TRACT
  Filled 2012-12-19 (×8): qty 0.5

## 2012-12-19 MED ORDER — ACETAMINOPHEN 650 MG RE SUPP: 650.0000 mg | Freq: Four times a day (QID) | RECTAL | Status: DC | PRN

## 2012-12-19 MED ORDER — ALBUTEROL (5 MG/ML) CONTINUOUS INHALATION SOLN
10.0000 mg/h | INHALATION_SOLUTION | RESPIRATORY_TRACT | Status: DC
  Administered 2012-12-19: 10 mg/h via RESPIRATORY_TRACT
  Filled 2012-12-19: qty 20

## 2012-12-19 MED ORDER — ALBUTEROL SULFATE (5 MG/ML) 0.5% IN NEBU
INHALATION_SOLUTION | RESPIRATORY_TRACT | Status: AC
  Filled 2012-12-19: qty 1

## 2012-12-19 MED ORDER — ALBUTEROL SULFATE (5 MG/ML) 0.5% IN NEBU
5.0000 mg | INHALATION_SOLUTION | Freq: Once | RESPIRATORY_TRACT | Status: AC
  Administered 2012-12-19: 5 mg via RESPIRATORY_TRACT

## 2012-12-19 MED ORDER — IPRATROPIUM BROMIDE 0.02 % IN SOLN
0.5000 mg | RESPIRATORY_TRACT | Status: DC | PRN
  Filled 2012-12-19: qty 2.5

## 2012-12-19 MED ORDER — SODIUM CHLORIDE 0.9 % IJ SOLN
3.0000 mL | Freq: Two times a day (BID) | INTRAMUSCULAR | Status: DC
  Administered 2012-12-19 – 2012-12-21 (×6): 3 mL via INTRAVENOUS

## 2012-12-19 MED ORDER — IPRATROPIUM BROMIDE 0.02 % IN SOLN
0.5000 mg | Freq: Four times a day (QID) | RESPIRATORY_TRACT | Status: DC
  Administered 2012-12-19 – 2012-12-21 (×8): 0.5 mg via RESPIRATORY_TRACT
  Filled 2012-12-19 (×8): qty 2.5

## 2012-12-19 MED ORDER — SODIUM CHLORIDE 0.9 % IJ SOLN: 3.0000 mL | INTRAMUSCULAR | Status: DC | PRN

## 2012-12-19 MED ORDER — WARFARIN - PHARMACIST DOSING INPATIENT: Freq: Every day | Status: DC

## 2012-12-19 MED ORDER — PANTOPRAZOLE SODIUM 40 MG PO TBEC
40.0000 mg | DELAYED_RELEASE_TABLET | Freq: Every day | ORAL | Status: DC
  Administered 2012-12-19 – 2012-12-22 (×4): 40 mg via ORAL
  Filled 2012-12-19 (×3): qty 1

## 2012-12-19 MED ORDER — HYDROCOD POLST-CHLORPHEN POLST 10-8 MG/5ML PO LQCR
5.0000 mL | Freq: Two times a day (BID) | ORAL | Status: DC | PRN
  Administered 2012-12-20 – 2012-12-21 (×4): 5 mL via ORAL
  Filled 2012-12-19 (×4): qty 5

## 2012-12-19 MED ORDER — THIAMINE HCL 100 MG/ML IJ SOLN
Freq: Once | INTRAVENOUS | Status: AC
  Administered 2012-12-19: 12:00:00 via INTRAVENOUS
  Filled 2012-12-19: qty 1000

## 2012-12-19 MED ORDER — ENOXAPARIN SODIUM 100 MG/ML ~~LOC~~ SOLN
1.0000 mg/kg | Freq: Two times a day (BID) | SUBCUTANEOUS | Status: DC
  Administered 2012-12-19 – 2012-12-20 (×2): 95 mg via SUBCUTANEOUS
  Filled 2012-12-19 (×5): qty 1

## 2012-12-19 MED ORDER — SODIUM CHLORIDE 0.9 % IV SOLN: 250.0000 mL | INTRAVENOUS | Status: DC | PRN

## 2012-12-19 MED ORDER — IOHEXOL 350 MG/ML SOLN
100.0000 mL | Freq: Once | INTRAVENOUS | Status: AC | PRN
  Administered 2012-12-19: 100 mL via INTRAVENOUS

## 2012-12-19 NOTE — ED Provider Notes (Signed)
Physical Exam  BP 110/66  Pulse 103  Temp(Src) 97.4 F (36.3 C) (Oral)  Resp 22  SpO2 100%  Physical Exam  Nursing note and vitals reviewed. Constitutional: He is oriented to person, place, and time. He appears well-developed and well-nourished. No distress.  HENT:  Head: Normocephalic and atraumatic.  Right Ear: External ear normal.  Left Ear: External ear normal.  Nose: Nose normal.  Eyes: Conjunctivae are normal.  Neck: Normal range of motion. No tracheal deviation present.  Cardiovascular: Normal rate, regular rhythm and normal heart sounds.   Pulmonary/Chest: Accessory muscle usage present. No stridor. He has wheezes.  Abdominal: Soft. He exhibits no distension. There is no tenderness.  Musculoskeletal: Normal range of motion.  Neurological: He is alert and oriented to person, place, and time.  Skin: Skin is warm and dry. He is not diaphoretic.  Psychiatric: He has a normal mood and affect. His behavior is normal.    ED Course  Procedures  Results for orders placed during the hospital encounter of 12/19/12  CBC WITH DIFFERENTIAL      Result Value Range   WBC 13.3 (*) 4.0 - 10.5 K/uL   RBC 4.88  4.22 - 5.81 MIL/uL   Hemoglobin 16.6  13.0 - 17.0 g/dL   HCT 40.9  81.1 - 91.4 %   MCV 95.1  78.0 - 100.0 fL   MCH 34.0  26.0 - 34.0 pg   MCHC 35.8  30.0 - 36.0 g/dL   RDW 78.2  95.6 - 21.3 %   Platelets 164  150 - 400 K/uL   Neutrophils Relative % 82 (*) 43 - 77 %   Neutro Abs 11.0 (*) 1.7 - 7.7 K/uL   Lymphocytes Relative 11 (*) 12 - 46 %   Lymphs Abs 1.4  0.7 - 4.0 K/uL   Monocytes Relative 6  3 - 12 %   Monocytes Absolute 0.8  0.1 - 1.0 K/uL   Eosinophils Relative 0  0 - 5 %   Eosinophils Absolute 0.1  0.0 - 0.7 K/uL   Basophils Relative 0  0 - 1 %   Basophils Absolute 0.0  0.0 - 0.1 K/uL  BASIC METABOLIC PANEL      Result Value Range   Sodium 137  135 - 145 mEq/L   Potassium 3.5  3.5 - 5.1 mEq/L   Chloride 99  96 - 112 mEq/L   CO2 28  19 - 32 mEq/L   Glucose, Bld 126 (*) 70 - 99 mg/dL   BUN 9  6 - 23 mg/dL   Creatinine, Ser 0.86  0.50 - 1.35 mg/dL   Calcium 8.7  8.4 - 57.8 mg/dL   GFR calc non Af Amer 86 (*) >90 mL/min   GFR calc Af Amer >90  >90 mL/min   Dg Chest 2 View  12/19/2012   *RADIOLOGY REPORT*  Clinical Data: Shortness of breath and asthma.  CHEST - 2 VIEW  Comparison: 04/03/2012  Findings: Shallow inspiration with elevation of the left hemidiaphragm.  Linear atelectasis in both lung bases, new since previous study.  No focal consolidation or airspace disease.  No blunting of costophrenic angles.  No pneumothorax.  Mediastinal contours appear intact.  Normal heart size and pulmonary vascularity.  IMPRESSION: Shallow inspiration with linear atelectasis in the lung bases.   Original Report Authenticated By: Burman Nieves, M.D.       MDM CBC and BMP pending. Will attempt additional nebulizer treatment. At this point discussed possible admission.   After  additional neb treatment patient still with accessory muscle use and wheezes on exhalation. Consulted internal med who agree to admit. Temporary admission orders placed.       Mora Bellman, PA-C 12/19/12 [phone]

## 2012-12-19 NOTE — ED Provider Notes (Signed)
History     CSN: [id number]  Arrival date & time 12/19/12  0403   First MD Initiated Contact with Patient 12/19/12 0411      Chief Complaint  Patient presents with  . Asthma    (Consider location/radiation/quality/duration/timing/severity/associated sxs/prior treatment) HPI Comments: Patient presents emergency department with chief complaint of asthma exacerbation. States that he has a history of asthma, but has not had an exacerbation over year. He does not have any inhalers studies currently using. He has not tried anything to alleviate his symptoms. Nothing makes his symptoms better or worse. He denies fevers, chills, nausea, or vomiting. Does endorse sore throat.  The history is provided by the patient. No language interpreter was used.    Past Medical History  Diagnosis Date  . Asthma     hospitalized in past, questionable hx of bronchitis dx  . DVT (deep venous thrombosis)     times 2, on coumadin chronically  . Depression     hx of SI  . Substance abuse     crack, cocaine, last use 2007  . Tobacco abuse   . GERD (gastroesophageal reflux disease)   . DVT (deep venous thrombosis)   . Bronchitis   . Heart murmur   . Shortness of breath   . Tuberculosis     ' I test Positive "    Past Surgical History  Procedure Laterality Date  . Skin graft      No family history on file.  History  Substance Use Topics  . Smoking status: Current Every Day Smoker -- 0.10 packs/day for 23 years    Types: Cigarettes  . Smokeless tobacco: Former Neurosurgeon  . Alcohol Use: Yes     Comment: OCCASIONAL      Review of Systems  All other systems reviewed and are negative.    Allergies  Review of patient's allergies indicates no known allergies.  Home Medications   Current Outpatient Rx  Name  Route  Sig  Dispense  Refill  . albuterol (PROVENTIL HFA;VENTOLIN HFA) 108 (90 BASE) MCG/ACT inhaler   Inhalation   Inhale 2 puffs into the lungs every 6 (six) hours as needed. For  breathing   1 Inhaler   6   . amoxicillin-clavulanate (AUGMENTIN) 875-125 MG per tablet   Oral   Take 1 tablet by mouth every 12 (twelve) hours.   14 tablet   0   . HYDROcodone-acetaminophen (NORCO/VICODIN) 5-325 MG per tablet   Oral   Take 1 tablet by mouth every 6 (six) hours as needed for pain.   10 tablet   0     BP 124/89  Pulse 95  Temp(Src) 97.4 F (36.3 C) (Oral)  Resp 26  SpO2 96%  Physical Exam  Nursing note and vitals reviewed. Constitutional: He is oriented to person, place, and time. He appears well-developed and well-nourished.  HENT:  Head: Normocephalic and atraumatic.  Eyes: Conjunctivae and EOM are normal. Right eye exhibits no discharge. Left eye exhibits no discharge. No scleral icterus.  Neck: Normal range of motion. Neck supple. No JVD present.  Cardiovascular: Normal rate, regular rhythm and normal heart sounds.  Exam reveals no gallop and no friction rub.   No murmur heard. Pulmonary/Chest: Effort normal. No respiratory distress. He has wheezes. He has no rales. He exhibits no tenderness.  Abdominal: Soft. Bowel sounds are normal. He exhibits no distension and no mass. There is no tenderness. There is no rebound and no guarding.  Musculoskeletal: Normal range of  motion. He exhibits no edema and no tenderness.  Neurological: He is alert and oriented to person, place, and time. He has normal reflexes.  CN 3-12 intact  Skin: Skin is warm and dry.  Psychiatric: He has a normal mood and affect. His behavior is normal. Judgment and thought content normal.    ED Course  Procedures (including critical care time)  Labs Reviewed - No data to display No results found.   No diagnosis found.    MDM  Patient with asthma exacerbation.  Will give nebs, check CXR, an re-evaluate.   Patient signed out to [place], [place].   Plan:  Follow up on CXR.  Ambulate with pulse ox.  If maintains >95% discharge home with mdi and prednisone.     Roxy Horseman, PA-C 12/19/12 [phone]

## 2012-12-19 NOTE — ED Provider Notes (Addendum)
47 year old male with history of asthma comes in with exacerbation of his asthma this evening. He is receiving prednisone and 2 breathing treatments in the ED but he still is using some accessory muscles of respiration and cause significant wheezing. He'll be given a third treatment. Of note, he does have a history of being hospitalized for asthma in the past as well as history of being on a ventilator.  Medical screening examination/treatment/procedure(s) were conducted as a shared visit with non-physician practitioner(s) and myself.  I personally evaluated the patient during the encounter\  Dione Booze, MD 12/19/12 0865  Dione Booze, MD 12/19/12 [phone]

## 2012-12-19 NOTE — ED Notes (Signed)
PT sts he is having left sided chest pain. EKG ordered and performed

## 2012-12-19 NOTE — ED Notes (Signed)
Pt returned from X-ray.  

## 2012-12-19 NOTE — ED Notes (Signed)
Pt presents with asthma exacerbation that began tonight. Audible wheezes, pt c/o throat pain

## 2012-12-19 NOTE — ED Notes (Signed)
Patient transported to CT 

## 2012-12-19 NOTE — Progress Notes (Signed)
ANTICOAGULATION CONSULT NOTE - Initial Consult  Pharmacy Consult for Coumadin Indication: h/o chronic DVT  No Known Allergies  Patient Measurements: Height: 6' 2.02" (188 cm) Weight: 210 lb 6.4 oz (95.437 kg) IBW/kg (Calculated) : 82.24  Vital Signs: Temp: 97.9 F (36.6 C) (06/12 1025) Temp src: Oral (06/12 1025) BP: 128/83 mmHg (06/12 1025) Pulse Rate: 95 (06/12 1025)  Labs:  Recent Labs  12/19/12 0658 12/19/12 0944  HGB 16.6  --   HCT 46.4  --   PLT 164  --   CREATININE 1.02  --   TROPONINI  --  <0.30    Estimated Creatinine Clearance: 105.2 ml/min (by C-G formula based on Cr of 1.02).   Medical History: Past Medical History  Diagnosis Date  . Asthma     hospitalized in past, questionable hx of bronchitis dx  . DVT (deep venous thrombosis)     times 2, on coumadin chronically  . Depression     hx of SI  . Substance abuse     crack, cocaine, last use 2007  . Tobacco abuse   . GERD (gastroesophageal reflux disease)   . DVT (deep venous thrombosis)   . Bronchitis   . Heart murmur   . Shortness of breath   . Tuberculosis     ' I test Positive "    Medications:  Prescriptions prior to admission  Medication Sig Dispense Refill  . albuterol (PROVENTIL) (2.5 MG/3ML) 0.083% nebulizer solution Take 2.5 mg by nebulization every 6 (six) hours as needed for wheezing.      Marland Kitchen albuterol (PROVENTIL HFA;VENTOLIN HFA) 108 (90 BASE) MCG/ACT inhaler Inhale 2 puffs into the lungs every 6 (six) hours as needed. For breathing  1 Inhaler  6    Assessment: 47 yo M with history of multiple chronic leg DVTs, supposed to be on life-long coumadin but per MD, has not been coming to outpatient appointments for monitoring or getting refills.  INR today reflects no Coumadin on board.  Plan is for therapeutic lovenox per MD and resume Coumadin per pharmacy.  MD also considering starting Xarelto (patient investigating co-pay to see if this is feasible).  Per previous medication  histories, patients historical coumadin doses have ranged from 12.5-15 mg po daily.  Also, warfarin dosing points score predicts high mg dosing needs to achieve goal INR.  Goal of Therapy:  INR 2-3 Monitor platelets by anticoagulation protocol: Yes   Plan:  - Warfarin 15 mg po x 1 - Daily INR  Rainee Sweatt L. Illene Bolus, PharmD, BCPS Clinical Pharmacist Pager: [phone] Pharmacy: [phone] 12/19/2012 10:49 AM

## 2012-12-19 NOTE — H&P (Signed)
Date: 12/19/2012               Patient Name:  [name] [name] MRN: [id number]  DOB: 27-Mar-1966 Age / Sex: 47 y.o., male   PCP: Dr. Suszanne Conners         Medical Service: Internal Medicine Teaching Service         Attending Physician: Dr. Farley Ly, MD    First Contact: Dr. Denton Ar Pager: [phone]  Second Contact: Dr. Suszanne Conners Pager: [phone]       After Hours (After 5p/  First Contact Pager: [phone]  weekends / holidays): Second Contact Pager: [phone]   Chief Complaint: Shortness of breath  History of Present Illness: [name] [name] is a 47 year old man with a PMH of asthma, chronic DVT, GERD, and upper respiratory cough syndrome who presents to the [facility] ED complaining of worsening SOB that he states started yesterday afternoon about 4 pm.  He states that a week prior he was exposed to a co-worker who was sick and he developed a scratchy throat, runny nose, and worsening cough.  He has been out of all of his inhalers for at least 3 months including advair and albuterol.  He currently denies fevers, chills, nausea, vomiting, or abdominal pain.  He has a non-productive cough, rhinorrhea, and mild chest pain when he coughs.  The chest pain started after arrival to the ED and is only present when he has a coughing fit.    He has a history of chronic DVT and is supposed to be on life long coumadin.  He has not followed up in the clinic since February of 2013 and states that he has been out of his coumadin for "a long time." He states that he made an appointment to follow up that is scheduled next Wednesday.  He denies swelling of the legs, pain in the legs, or chronic shortness of breath.    Meds: Current Outpatient Prescriptions  Medication Sig Dispense Refill  . albuterol (PROVENTIL) (2.5 MG/3ML) 0.083% nebulizer solution Take 2.5 mg by nebulization every 6 (six) hours as needed for wheezing.      Marland Kitchen albuterol (PROVENTIL HFA;VENTOLIN HFA) 108 (90 BASE) MCG/ACT inhaler Inhale 2 puffs into  the lungs every 6 (six) hours as needed. For breathing  1 Inhaler  6   Allergies: Allergies as of 12/19/2012  . (No Known Allergies)   Past Medical History  Diagnosis Date  . Asthma     hospitalized in past, questionable hx of bronchitis dx  . DVT (deep venous thrombosis)     times 2, on coumadin chronically  . Depression     hx of SI  . Substance abuse     crack, cocaine, last use 2007  . Tobacco abuse   . GERD (gastroesophageal reflux disease)   . DVT (deep venous thrombosis)   . Bronchitis   . Heart murmur   . Shortness of breath   . Tuberculosis     ' I test Positive "   Past Surgical History  Procedure Laterality Date  . Skin graft     No family history on file. History   Social History  . Marital Status: Single    Spouse Name: N/A    Number of Children: N/A  . Years of Education: N/A   Occupational History  . Details Cars    Social History Main Topics  . Smoking status: Current Every Day Smoker -- 0.10 packs/day for 23 years  Types: Cigarettes  . Smokeless tobacco: Former Neurosurgeon  . Alcohol Use: Yes     Comment: OCCASIONAL  . Drug Use: No  . Sexually Active: Yes   Other Topics Concern  . Not on file   Social History Narrative   Working at [employer] currently.  States he has insurance through them now.             Review of Systems: Constitutional: Denies fever, chills, diaphoresis, appetite change and fatigue.  HEENT: Denies photophobia, eye pain, redness, hearing loss, ear pain, congestion, sore throat, rhinorrhea, sneezing, mouth sores, trouble swallowing, neck pain, neck stiffness and tinnitus.   Respiratory: Positive for SOB, cough, chest tightness,  and wheezing.  Denies DOE Cardiovascular: Positive for chest pain.  Denies palpitations and leg swelling.  Gastrointestinal: Denies nausea, vomiting, abdominal pain, diarrhea, constipation, blood in stool and abdominal distention.  Genitourinary: Denies dysuria, urgency, frequency,  hematuria, flank pain and difficulty urinating.  Endocrine: Denies: hot or cold intolerance, sweats, changes in hair or nails, polyuria, polydipsia. Musculoskeletal: Denies myalgias, back pain, joint swelling, arthralgias and gait problem.  Skin: Denies pallor, rash and wound.  Neurological: Denies dizziness, seizures, syncope, weakness, light-headedness, numbness and headaches.  Hematological: Denies adenopathy. Easy bruising, personal or family bleeding history  Psychiatric/Behavioral: Denies suicidal ideation, mood changes, confusion, nervousness, sleep disturbance and agitation  Physical Exam: Blood pressure 113/72, pulse 96, temperature 97.4 F (36.3 C), temperature source Oral, resp. rate 22, SpO2 97.00%. Constitutional: Vital signs reviewed.  Patient is a well-developed and well-nourished man in mild respiratory distress and cooperative with exam. Alert and oriented x3.  Head: Normocephalic and atraumatic Ear: TM normal bilaterally Nose: No erythema or drainage noted.  Turbinates normal Mouth: no erythema or exudates, MMM Eyes: PERRL, EOMI, conjunctivae normal, No scleral icterus.  Neck: Supple, Trachea midline normal ROM, No JVD, mass, thyromegaly, or carotid bruit present.  Cardiovascular: tachycardic rate and regular rhythm, S1 normal, S2 normal, no MRG, pulses symmetric and intact bilaterally Pulmonary/Chest: mild respiratory distress, sitting up in bed, using accessory chest muscles to breath.  No retractions noted.  Diffuse expiratory wheezes in all lung fields.  No inspiratory sounds noted.  Abdominal: Soft. Non-tender, non-distended, bowel sounds are normal, no masses, organomegaly, or guarding present.  GU: no CVA tenderness Musculoskeletal: No joint deformities, erythema, or stiffness, ROM full and no nontender Hematology: no cervical, inginal, or axillary adenopathy.  Neurological: A&O x3, Strength is normal and symmetric bilaterally, cranial nerve II-XII are grossly intact,  no focal motor deficit, sensory intact to light touch bilaterally.  Skin: Warm, dry and intact. No rash, cyanosis, or clubbing.  Psychiatric: Normal mood and affect. speech and behavior is normal. Judgment and thought content normal. Cognition and memory are normal.   Lab results: Basic Metabolic Panel:  Recent Labs  28/41/32 0658  NA 137  K 3.5  CL 99  CO2 28  GLUCOSE 126*  BUN 9  CREATININE 1.02  CALCIUM 8.7   CBC:  Recent Labs  12/19/12 0658  WBC 13.3*  NEUTROABS 11.0*  HGB 16.6  HCT 46.4  MCV 95.1  PLT 164   Drugs of Abuse  Pending  Imaging results:  Dg Chest 2 View  12/19/2012   *RADIOLOGY REPORT*  Clinical Data: Shortness of breath and asthma.  CHEST - 2 VIEW  Comparison: 04/03/2012  Findings: Shallow inspiration with elevation of the left hemidiaphragm.  Linear atelectasis in both lung bases, new since previous study.  No focal consolidation or airspace disease.  No blunting of costophrenic angles.  No pneumothorax.  Mediastinal contours appear intact.  Normal heart size and pulmonary vascularity.  IMPRESSION: Shallow inspiration with linear atelectasis in the lung bases.   Original Report Authenticated By: Burman Nieves, M.D.   Other results: EKG: sinus tachycardia, left atrial enlargement noted.  Flattened T waves in V5 and V6, no ST segment changes.   Assessment & Plan by Problem: Mr. Hansson is a 47 year old man who presents to the [facility] ED in an asthma exacerbation after being out of his medications and getting a likely viral URI.   1.  Acute Asthma Exacerbation:  Mr. Suit presents in an asthma exacerbation today.  Triggering events include a viral URI vs allergic rhinitis vs medication non-compliance.  He has been without his medication for at least 3 months he states.  He is happy to report that he has insurance now through [employer] and he can now get his medications.  In the ED he was given 60 mg of prednisone and 4 nebs and he continues to have  prominent wheezes in all lung fields.  He has no fever and his chest x-ray is clear so there is no indication for antibiotic therapy at this time.   - Admit to med surg  - Continuous pulse ox and O2 PRN  - Prednisone 60 mg daily  - duonebs Q6 scheduled and q4 PRN.    - Protonix to control his GERD and chlorpheniramine to control his post nasal drip and limit his coughing  - Tylenol and morphine for pain.  - Nicotine patch  2.  Chest pain:  He has endorsed chest pain that started after presentation to the ED that is worse with coughing.  He has no diagnosed risk factors for ACS other then smoking.  Other differential diagnosis include PE and cough secondary to asthma exacerbation.  He has a history of a chronic DVT and has been off his coumadin for sometime.  Modified Geneva score is 8 for his previous VTE and tachycardia.    - CT angio of the chest  - Start Lovenox 1 mg/kg q12  - cycle troponin to rule out ACS  - morphine for pain and cough suppression  3.  History of a chronic leg DVT:  He has a history of multiple chronic DVT and is supposed to be on life long coumadin.  He has not been on the medication for sometime due to not coming for monitoring and getting refills.  We discussed today the indications for chronic anticoagulation and the new novel anticoagulants.  He will check with his insurance to see what the copay for Xarelto is and then we will decide what medication to start.  - Lovenox 1 mg/kg BID for now  - Consider starting Xarelto vs coumadin once he finds out the copay.  4.  VTE: Full dose Lovenox  Dispo: Disposition is deferred at this time, awaiting improvement of current medical problems. Anticipated discharge in approximately 2-3 day(s).   The patient does have a current PCP (Dr. Suszanne Conners), therefore will be requiring OPC follow-up after discharge.   The patient does not have transportation limitations that hinder transportation to clinic  appointments.  Signed: Leodis Sias, MD 12/19/2012, 9:11 AM

## 2012-12-19 NOTE — ED Notes (Signed)
Respiratory contacted for continuous neb.  

## 2012-12-19 NOTE — ED Notes (Signed)
Pt ambulated without difficulty. Pt O2 stats stayed at 97% during ambulation.

## 2012-12-19 NOTE — H&P (Signed)
Internal Medicine Attending Admission Note Date: 12/19/2012  Patient name: [name] [name] Medical record number: [id number] Date of birth: 03-Jul-1966 Age: 47 y.o. Gender: male  I saw and evaluated the patient. I reviewed the resident's note and I agree with the resident's findings and plan as documented in the resident's note, with the following additional comments.  Chief Complaint(s): Shortness of breath, wheezing  History - key components related to admission: Patient is a 47 year old man with history of asthma, recurrent DVT, GERD, and other problems as outlined in medical history, admitted with complaint of shortness of breath and wheezing that began after he developed URI symptoms yesterday morning.  Patient reports that he has been out of his inhalers for several months.     Physical Exam - key components related to admission:  Filed Vitals:   12/19/12 1025 12/19/12 1137 12/19/12 1355 12/19/12 1502  BP: 128/83  128/72   Pulse: 95  93   Temp: 97.9 F (36.6 C)  98.2 F (36.8 C)   TempSrc: Oral  Oral   Resp: 20  20   Height:      Weight: 210 lb 6.4 oz (95.437 kg)     SpO2: 97% 96% 97% 96%    General: Alert, oriented, no acute distress Lungs: Moderate diffuse expiratory wheezing bilaterally Heart: Regular; 2/6 systolic murmur at the upper right and left sternal borders Abdomen: Bowel sounds present, soft, nontender Extremities: No edema; no calf tenderness   Lab results:   Basic Metabolic Panel:  Recent Labs  65/78/46 0658 12/19/12 1136  NA 137  --   K 3.5  --   CL 99  --   CO2 28  --   GLUCOSE 126*  --   BUN 9  --   CREATININE 1.02  --   CALCIUM 8.7  --   MG  --  2.3    CBC:  Recent Labs  12/19/12 0658  WBC 13.3*  NEUTROABS 11.0*  HGB 16.6  HCT 46.4  MCV 95.1  PLT 164    Cardiac Enzymes:  Recent Labs  12/19/12 0944 12/19/12 1550  TROPONINI <0.30 <0.30     Thyroid Function Tests:  Recent Labs  12/19/12 1136  TSH 0.469     Coagulation:  Recent Labs  12/19/12 1136  INR 1.07    Drugs of Abuse     Component Value Date/Time   LABOPIA NONE DETECTED 12/19/2012 1340   COCAINSCRNUR NONE DETECTED 12/19/2012 1340   LABBENZ NONE DETECTED 12/19/2012 1340   AMPHETMU NONE DETECTED 12/19/2012 1340   THCU POSITIVE* 12/19/2012 1340   LABBARB NONE DETECTED 12/19/2012 1340      Imaging results:  Dg Chest 2 View  12/19/2012   *RADIOLOGY REPORT*  Clinical Data: Shortness of breath and asthma.  CHEST - 2 VIEW  Comparison: 04/03/2012  Findings: Shallow inspiration with elevation of the left hemidiaphragm.  Linear atelectasis in both lung bases, new since previous study.  No focal consolidation or airspace disease.  No blunting of costophrenic angles.  No pneumothorax.  Mediastinal contours appear intact.  Normal heart size and pulmonary vascularity.  IMPRESSION: Shallow inspiration with linear atelectasis in the lung bases.   Original Report Authenticated By: Burman Nieves, M.D.   Ct Angio Chest Pe W/cm &/or Wo Cm  12/19/2012   *RADIOLOGY REPORT*  Clinical Data: 47 year old male with shortness of breath and left chest pain.  History of recurrent DVT.  CT ANGIOGRAPHY CHEST  Technique:  Multidetector CT imaging of the chest using  the standard protocol during bolus administration of intravenous contrast. Multiplanar reconstructed images including MIPs were obtained and reviewed to evaluate the vascular anatomy.  Contrast: OMNIPAQUE IOHEXOL 350 MG/ML SOLN  Comparison: 04/03/2012 and prior CTs  Findings: This is a technically adequate study.  No pulmonary emboli are identified. There is no evidence of thoracic aortic aneurysm. The heart and great vessels are within normal limits.  There are no pleural or pericardial effusions present. No enlarged lymph nodes are identified.  The lungs are clear. There is no evidence of nodule, mass, airspace disease, consolidation or endobronchial/endotracheal lesion.  No acute or suspicious bony  abnormalities are identified. The visualized upper abdomen is unremarkable.  IMPRESSION: Unremarkable exam - no evidence of pulmonary emboli.   Original Report Authenticated By: Harmon Pier, M.D.    Other results: EKG: sinus tachycardia; borderline T wave abnormalities   Assessment & Plan by Problem:  1.  Asthma exacerbation.  Patient presents with shortness of breath and wheezing consistent with an acute asthma exacerbation, likely precipitated by an upper respiratory infection.  The plan is treat with steroids and inhaled bronchodilators; follow peak flows; supplement oxygen and follow saturations.  Would review chart to see if he has had prior pulmonary function tests done; if not, then these would be appropriate on outpatient follow-up.  2.  Upper respiratory infection.  Patient has symptoms including runny nose and a previous sore throat which has resolved, consistent with a viral URI.  The plan is symptomatic treatment.  3.  History of chronic DVT.  Patient reports a history of recurrent DVT, and lower extremity duplex study in February of 2013 showed findings consistent with chronic DVT.  He was previously on long-term anticoagulation, but he has been out of warfarin for a long time and has not followed up in the anticoagulation clinic.  Plan is treat with Lovenox and restart warfarin, with possible transition to a novel oral anticoagulant if patient's insurance will cover or if he can afford.  4.  Systolic murmur.  Patient reports that this is chronic and he was informed of the murmur several years ago.  It appears to be an asymptomatic murmur.  Plan is to try to determine whether he has had a prior 2-D echocardiogram.  5.  Other problems and plans as per the resident physician's note.

## 2012-12-20 ENCOUNTER — Inpatient Hospital Stay (HOSPITAL_COMMUNITY): Payer: 59

## 2012-12-20 DIAGNOSIS — R079 Chest pain, unspecified: Secondary | ICD-10-CM

## 2012-12-20 DIAGNOSIS — I82409 Acute embolism and thrombosis of unspecified deep veins of unspecified lower extremity: Secondary | ICD-10-CM

## 2012-12-20 LAB — BASIC METABOLIC PANEL
Chloride: 105 mEq/L (ref 96–112)
Creatinine, Ser: 0.89 mg/dL (ref 0.50–1.35)
GFR calc Af Amer: >90 mL/min (ref >90)
GFR calc non Af Amer: >90 mL/min (ref >90)
Potassium: 3.9 mEq/L (ref 3.5–5.1)

## 2012-12-20 LAB — PROTIME-INR
INR: 1.07 (ref 0.00–1.49)
Prothrombin Time: 13.8 seconds (ref 11.6–15.2)

## 2012-12-20 LAB — CBC
HCT: 47.4 % (ref 39.0–52.0)
Hemoglobin: 16.1 g/dL (ref 13.0–17.0)
RDW: 14.3 % (ref 11.5–15.5)
WBC: 11.1 10*3/uL — ABNORMAL HIGH (ref 4.0–10.5)

## 2012-12-20 MED ORDER — METHYLPREDNISOLONE SODIUM SUCC 40 MG IJ SOLR
40.0000 mg | Freq: Once | INTRAMUSCULAR | Status: AC
  Administered 2012-12-20: 40 mg via INTRAVENOUS
  Filled 2012-12-20: qty 1

## 2012-12-20 MED ORDER — ENOXAPARIN SODIUM 40 MG/0.4ML ~~LOC~~ SOLN
40.0000 mg | SUBCUTANEOUS | Status: DC
  Administered 2012-12-20 – 2012-12-22 (×3): 40 mg via SUBCUTANEOUS
  Filled 2012-12-20 (×3): qty 0.4

## 2012-12-20 MED ORDER — DOXYCYCLINE HYCLATE 100 MG PO TABS
100.0000 mg | ORAL_TABLET | Freq: Two times a day (BID) | ORAL | Status: DC
Start: 2012-12-20 — End: 2012-12-22
  Administered 2012-12-20 – 2012-12-22 (×5): 100 mg via ORAL
  Filled 2012-12-20 (×6): qty 1

## 2012-12-20 MED ORDER — WARFARIN SODIUM 7.5 MG PO TABS
15.0000 mg | ORAL_TABLET | Freq: Once | ORAL | Status: AC
  Administered 2012-12-20: 15 mg via ORAL
  Filled 2012-12-20: qty 2

## 2012-12-20 NOTE — Progress Notes (Signed)
Instructed on needed sputum speciman.  Try after next RT TX at 1400.

## 2012-12-20 NOTE — Progress Notes (Signed)
Subjective: Pt reports that he only got 30 minutes of sleep last night due to his cough. He feels that his cough is worse today than yesterday and feels tired from lack of sleep over the past few days. He also reports congestion and continued wheezing. He had a headache last night brought on by coughing but it improved with tylenol. His chest pain occurs only when he coughs.   Objective: Vital signs in last 24 hours: Filed Vitals:   12/19/12 1502 12/19/12 2156 12/20/12 0546 12/20/12 0811  BP:  127/81 124/84   Pulse:  93 73   Temp:  97.9 F (36.6 C) 98 F (36.7 C)   TempSrc:  Oral Oral   Resp:  20 20   Height:      Weight:      SpO2: 96% 96% 96% 96%   Intake/Output Summary (Last 24 hours) at 12/20/12 0944 Last data filed at 12/19/12 1800  Gross per 24 hour  Intake   1518 ml  Output    200 ml  Net   1318 ml    Constitutional: Vital signs reviewed. Well appearing man sitting up in bed in mild distress from recurrent cough.  Head: Normocephalic and atraumatic  Eyes: EOMI, conjunctivae normal, No scleral icterus.  Cardiovascular: RRR, 1/6 systolic murmur at RUSB and LUSB, pulses symmetric and intact bilaterally  Pulmonary/Chest: Normal work of breathing. No retractions noted. Diffuse expiratory wheezes and coarse breath sounds in all lung fields unchanged from previous exam yesterday. No inspiratory sounds noted.  Abdominal: Soft. Non-tender, non-distended, bowel sounds are normal, no masses or organomegaly appreciated Skin: Warm, dry and intact. No visible rashes.   Lab Results: Basic Metabolic Panel:  Recent Labs Lab 12/19/12 0658 12/19/12 1136 12/20/12 0600  NA 137  --  139  K 3.5  --  3.9  CL 99  --  105  CO2 28  --  26  GLUCOSE 126*  --  87  BUN 9  --  9  CREATININE 1.02  --  0.89  CALCIUM 8.7  --  9.0  MG  --  2.3  --    CBC:  Recent Labs Lab 12/19/12 0658 12/20/12 0600  WBC 13.3* 11.1*  NEUTROABS 11.0*  --   HGB 16.6 16.1  HCT 46.4 47.4  MCV 95.1  96.1  PLT 164 180   Cardiac Enzymes:  Recent Labs Lab 12/19/12 0944 12/19/12 1550 12/19/12 2215  TROPONINI <0.30 <0.30 <0.30   Thyroid Function Tests:  Recent Labs Lab 12/19/12 1136  TSH 0.469   Coagulation:  Recent Labs Lab 12/19/12 1136 12/20/12 0600  LABPROT 13.8 13.8  INR 1.07 1.07   Urine Drug Screen: Drugs of Abuse     Component Value Date/Time   LABOPIA NONE DETECTED 12/19/2012 1340   COCAINSCRNUR NONE DETECTED 12/19/2012 1340   LABBENZ NONE DETECTED 12/19/2012 1340   AMPHETMU NONE DETECTED 12/19/2012 1340   THCU POSITIVE* 12/19/2012 1340   LABBARB NONE DETECTED 12/19/2012 1340    Studies/Results: EKG (12/19/12): sinus tachycardia, possible LAE, flat T waves in V4-V6.  Dg Chest 2 View  12/19/2012   *RADIOLOGY REPORT*  Clinical Data: Shortness of breath and asthma.  CHEST - 2 VIEW  Comparison: 04/03/2012  Findings: Shallow inspiration with elevation of the left hemidiaphragm.  Linear atelectasis in both lung bases, new since previous study.  No focal consolidation or airspace disease.  No blunting of costophrenic angles.  No pneumothorax.  Mediastinal contours appear intact.  Normal heart size and  pulmonary vascularity.  IMPRESSION: Shallow inspiration with linear atelectasis in the lung bases.   Original Report Authenticated By: Burman Nieves, M.D.   Ct Angio Chest Pe W/cm &/or Wo Cm  12/19/2012   *RADIOLOGY REPORT*  Clinical Data: 47 year old male with shortness of breath and left chest pain.  History of recurrent DVT.  CT ANGIOGRAPHY CHEST  Technique:  Multidetector CT imaging of the chest using the standard protocol during bolus administration of intravenous contrast. Multiplanar reconstructed images including MIPs were obtained and reviewed to evaluate the vascular anatomy.  Contrast: OMNIPAQUE IOHEXOL 350 MG/ML SOLN  Comparison: 04/03/2012 and prior CTs  Findings: This is a technically adequate study.  No pulmonary emboli are identified. There is no evidence  of thoracic aortic aneurysm. The heart and great vessels are within normal limits.  There are no pleural or pericardial effusions present. No enlarged lymph nodes are identified.  The lungs are clear. There is no evidence of nodule, mass, airspace disease, consolidation or endobronchial/endotracheal lesion.  No acute or suspicious bony abnormalities are identified. The visualized upper abdomen is unremarkable.  IMPRESSION: Unremarkable exam - no evidence of pulmonary emboli.   Original Report Authenticated By: Harmon Pier, M.D.   Medications: I have reviewed the patient's current medications. Scheduled Meds: . albuterol  2.5 mg Nebulization Q6H   And  . ipratropium  0.5 mg Nebulization Q6H  . enoxaparin (LOVENOX) injection  1 mg/kg Subcutaneous Q12H  . methylPREDNISolone (SOLU-MEDROL) injection  40 mg Intravenous Once  . pantoprazole  40 mg Oral Daily  . pneumococcal 23 valent vaccine  0.5 mL Intramuscular Tomorrow-1000  . predniSONE  60 mg Oral Q breakfast  . sodium chloride  3 mL Intravenous Q12H  . Warfarin - Pharmacist Dosing Inpatient   Does not apply q1800   Continuous Infusions:  PRN Meds:.sodium chloride, acetaminophen, acetaminophen, albuterol, chlorpheniramine-HYDROcodone, ipratropium, morphine injection, ondansetron (ZOFRAN) IV, ondansetron, sodium chloride   Assessment/Plan: Mr. Fitzgibbon is a 47 year old man who presented with an asthma exacerbation likely secondary to medication non-adherence and precipitated by a viral URI.  1. Acute Asthma Exacerbation: Possible triggering events include recent viral URI vs allergic rhinitis vs medication non-adherence for several months. Chest pain is likely secondary to chronic cough as pt reports pain only occurs with cough, EKG is w/o ST changes, and trops are negative x3. Pt is currently stable but there is not much improvement in signs and symptoms compared to yesterday. - Continuous pulse ox - Prednisone 60 mg daily  - Prednisone 40 mg IV  today - Repeat CXR today - Start doxycycline - Continue duonebs albuterol and ipratropium q6 scheduled and q4 prn     - Follow peak flows - Pneumovax today - PFTs as outpatient  2. URI: Symptoms include congestion, rhinorrhea, and cough. - Symptomatic treatment with chlorpheniramine - Pantoprazole 40mg  qd for GERD  - Tylenol for pain and morphine for cough suppression  3. History of chronic DVT: He has had 2 DVTs in the past and has been off coumadin for several months although he is supposed to be on life long coumadin. LE duplex study in Feb 2013 showed findings consistent with chronic DVT. CT angio negative for PE. - Therapeutic lovenox at 1 mg/kg q12hr - Restarted warfarin 15mg  qd - Follow INR - Possibly may be a candidate for Xarelto depending on his insurance and what the copay will be.   4. Systolic Murmur: Appears to be asymptomatic. He was informed about the murmur in his 62s when  he was in the [employer]. He never received further workup or evaluation by a cardiologist. - Continue to monitor. No indication for intervention at this time.  5. Tobacco Use: Around 3 pack year tobacco use. - Will need counseling to quit - Nicotine patch  6. Dispo: Disposition is deferred at this time, awaiting improvement of current medical problems. Anticipated discharge in approximately 2-3 day(s).  The patient does have a current PCP (Dr. Suszanne Conners), therefore will be requiring OPC follow-up after discharge.  The patient does not have transportation limitations that hinder transportation to clinic appointments.   This is a Psychologist, occupational Note.  The care of the patient was discussed with Dr. Collier Bullock, Dr. Dorise Hiss, and Dr. Meredith Pel and the assessment and plan formulated with their assistance.  Please see their attached note for official documentation of the daily encounter.   LOS: 1 day   Kerrie Pleasure, Med Student 12/20/2012, 9:44 AM

## 2012-12-20 NOTE — Progress Notes (Signed)
Internal Medicine Attending  Date: 12/20/2012  Patient name: [name] [name] Medical record number: [id number] Date of birth: [birth year] Age: 47 y.o. Gender: male  I saw and evaluated the patient. I reviewed the resident's note by Dr. Collier Bullock and I agree with the resident's findings and plans as documented in her note.  Dr. Rogelia Boga will cover as the on-call attending physician this weekend, and Dr. Dalphine Handing will take over as attending physician on Monday 12/23/2012.

## 2012-12-20 NOTE — Progress Notes (Signed)
Subjective: Patient states that he didn't sleep well last night. Cough and sputum production kept him awake. Has some SOB with walking around but has not been out of the room too much.  Give additional history that his last asthma exacerbation was last year, characterized by SOB, cold symptoms. He has never been intubated or in the ICU because of his asthma.  Denies fever, chills, chest pain.  Objective: Vital signs in last 24 hours: Filed Vitals:   12/19/12 1355 12/19/12 1502 12/19/12 2156 12/20/12 0546  BP: 128/72  127/81 124/84  Pulse: 93  93 73  Temp: 98.2 F (36.8 C)  97.9 F (36.6 C) 98 F (36.7 C)  TempSrc: Oral  Oral Oral  Resp: 20  20 20   Height:      Weight:      SpO2: 97% 96% 96% 96%   Weight change:   Intake/Output Summary (Last 24 hours) at 12/20/12 0725 Last data filed at 12/19/12 1800  Gross per 24 hour  Intake   1518 ml  Output    200 ml  Net   1318 ml    Physical Exam Blood pressure 124/84, pulse 73, temperature 98 F (36.7 C), temperature source Oral, resp. rate 20, height 6' 2.02" (1.88 m), weight 210 lb 6.4 oz (95.437 kg), SpO2 96.00%. General:  No acute distress, alert and oriented x 3, well-appearing AAM HEENT:  PERRL, EOMI, moist mucous membranes Cardiovascular:  Regular rate and rhythm, no murmurs, rubs or gallops Respiratory:  Increased RR, course breath sounds b/l, wheezing throughout, crackles Abdomen:  Soft, nondistended, nontender, bowel sounds present Extremities:  Warm and well-perfused, no edema.  Skin: Warm, dry, no rashes Neuro: Not anxious appearing, no depressed mood, normal affect  Lab Results: Basic Metabolic Panel:  Recent Labs Lab 12/19/12 0658 12/19/12 1136 12/20/12 0600  NA 137  --  139  K 3.5  --  3.9  CL 99  --  105  CO2 28  --  26  GLUCOSE 126*  --  87  BUN 9  --  9  CREATININE 1.02  --  0.89  CALCIUM 8.7  --  9.0  MG  --  2.3  --    CBC:  Recent Labs Lab 12/19/12 0658 12/20/12 0600  WBC 13.3* 11.1*   NEUTROABS 11.0*  --   HGB 16.6 16.1  HCT 46.4 47.4  MCV 95.1 96.1  PLT 164 180   Cardiac Enzymes:  Recent Labs Lab 12/19/12 0944 12/19/12 1550 12/19/12 2215  TROPONINI <0.30 <0.30 <0.30   Thyroid Function Tests:  Recent Labs Lab 12/19/12 1136  TSH 0.469   Coagulation:  Recent Labs Lab 12/19/12 1136 12/20/12 0600  LABPROT 13.8 13.8  INR 1.07 1.07   Urine Drug Screen: Drugs of Abuse     Component Value Date/Time   LABOPIA NONE DETECTED 12/19/2012 1340   COCAINSCRNUR NONE DETECTED 12/19/2012 1340   LABBENZ NONE DETECTED 12/19/2012 1340   AMPHETMU NONE DETECTED 12/19/2012 1340   THCU POSITIVE* 12/19/2012 1340   LABBARB NONE DETECTED 12/19/2012 1340      Studies/Results: Dg Chest 2 View  12/19/2012   *RADIOLOGY REPORT*  Clinical Data: Shortness of breath and asthma.  CHEST - 2 VIEW  Comparison: 04/03/2012  Findings: Shallow inspiration with elevation of the left hemidiaphragm.  Linear atelectasis in both lung bases, new since previous study.  No focal consolidation or airspace disease.  No blunting of costophrenic angles.  No pneumothorax.  Mediastinal contours appear intact.  Normal heart  size and pulmonary vascularity.  IMPRESSION: Shallow inspiration with linear atelectasis in the lung bases.   Original Report Authenticated By: Burman Nieves, M.D.   Ct Angio Chest Pe W/cm &/or Wo Cm  12/19/2012   *RADIOLOGY REPORT*  Clinical Data: 47 year old male with shortness of breath and left chest pain.  History of recurrent DVT.  CT ANGIOGRAPHY CHEST  Technique:  Multidetector CT imaging of the chest using the standard protocol during bolus administration of intravenous contrast. Multiplanar reconstructed images including MIPs were obtained and reviewed to evaluate the vascular anatomy.  Contrast: OMNIPAQUE IOHEXOL 350 MG/ML SOLN  Comparison: 04/03/2012 and prior CTs  Findings: This is a technically adequate study.  No pulmonary emboli are identified. There is no evidence of  thoracic aortic aneurysm. The heart and great vessels are within normal limits.  There are no pleural or pericardial effusions present. No enlarged lymph nodes are identified.  The lungs are clear. There is no evidence of nodule, mass, airspace disease, consolidation or endobronchial/endotracheal lesion.  No acute or suspicious bony abnormalities are identified. The visualized upper abdomen is unremarkable.  IMPRESSION: Unremarkable exam - no evidence of pulmonary emboli.   Original Report Authenticated By: Harmon Pier, M.D.   Medications:  Medications reviewed  Scheduled Meds: . albuterol  2.5 mg Nebulization Q6H   And  . ipratropium  0.5 mg Nebulization Q6H  . enoxaparin (LOVENOX) injection  1 mg/kg Subcutaneous Q12H  . pantoprazole  40 mg Oral Daily  . pneumococcal 23 valent vaccine  0.5 mL Intramuscular Tomorrow-1000  . predniSONE  60 mg Oral Q breakfast  . sodium chloride  3 mL Intravenous Q12H  . Warfarin - Pharmacist Dosing Inpatient   Does not apply q1800   Continuous Infusions:  PRN Meds:.sodium chloride, acetaminophen, acetaminophen, albuterol, chlorpheniramine-HYDROcodone, ipratropium, morphine injection, ondansetron (ZOFRAN) IV, ondansetron, sodium chloride  Assessment/Plan:  Asthma Exacerbation CTA negative for PE, no airspace disease. CXR negative on admission.  Possible triggers include viral URI, medication nonadherence. Pt remains afebrile. Pt does have some associated mild chest pain with coughing spells. Doubt CHF as diagnosis as patient does not have LE edema, no cardiac history besides benign heart murmur. Also considered: sarcoid (though no evidence on CT scan), pertussis, URI, PNA. 6/13: Patient with continued wheezing, cough, no improvement in exam. +sputum production. Peak flow is over 400 this morning. Expected based on age/ht (6'4'') is 653  -repeat CXR, 2 view -sputum culture/gram stain -continue duonebs -prednisone - will give 40mg  IV -start doxycycline  100mg  BID -O2 as needed  Recurrent DVT non compliant with coumadin Patient noncompliant with coumadin over last few months, should be on chronic anticoagulation. Patient open to starting newer agent without need for monitoring, will look into cost with insurance company. -continue warfarin for now, dosing per pharmacy  FEN -regular diet -NSL  DVT ppx -lovenox  Dispo -deferred until further improvement -will need OPC follow up (Dr. Dorise Hiss is PCP)   LOS: 1 day   [place] Ar 12/20/2012, 7:25 AM

## 2012-12-20 NOTE — Progress Notes (Signed)
ANTICOAGULATION CONSULT NOTE - Follow Up Consult  Pharmacy Consult for Warfarin Indication: atrial fibrillation  No Known Allergies  Patient Measurements: Height: 6' 2.02" (188 cm) Weight: 210 lb 6.4 oz (95.437 kg) IBW/kg (Calculated) : 82.24  Vital Signs: Temp: 98 F (36.7 C) (06/13 0546) Temp src: Oral (06/13 0546) BP: 124/84 mmHg (06/13 0546) Pulse Rate: 73 (06/13 0546)  Labs:  Recent Labs  12/19/12 0658 12/19/12 0944 12/19/12 1136 12/19/12 1550 12/19/12 2215 12/20/12 0600  HGB 16.6  --   --   --   --  16.1  HCT 46.4  --   --   --   --  47.4  PLT 164  --   --   --   --  180  LABPROT  --   --  13.8  --   --  13.8  INR  --   --  1.07  --   --  1.07  CREATININE 1.02  --   --   --   --  0.89  TROPONINI  --  <0.30  --  <0.30 <0.30  --     Estimated Creatinine Clearance: 120.6 ml/min (by C-G formula based on Cr of 0.89).   Assessment: 47 y.o. M who continues on warfarin for hx DVT. Full dose lovenox bridging discontinued this morning after CTA negative for PE and switched to VTE px dosing. INR this morning remains SUBtherapeutic (INR 1.07 << 1.07, goal of 2-3). Hgb/Hct/Plt stable. It is noted that the patient is concurrently on steroids and antibiotics (doxycycline) which can increase warfarin sensitivity -- will monitor closely.  This patient has a known history of warfarin noncompliance (was last seen in the IM anticoagulation clinic on 09/04/11). It is noted that the rounding team is considering alternative agents for this patient. Will hold off on warfarin re-education at this time until final plans are made for this patient's oral anticoagulation.   Goal of Therapy:  INR 2-3   Plan:  1. Warfarin 15 mg x 1 dose at 1800 today 2. Will continue to monitor for any signs/symptoms of bleeding and will follow up with PT/INR in the a.m.  3. Will follow-up with any plans to change oral anticoagulants  Georgina Pillion, PharmD, BCPS Clinical Pharmacist Pager:  [phone] 12/20/2012 11:29 AM

## 2012-12-21 LAB — EXPECTORATED SPUTUM ASSESSMENT W GRAM STAIN, RFLX TO RESP C

## 2012-12-21 MED ORDER — ALBUTEROL SULFATE (5 MG/ML) 0.5% IN NEBU
2.5000 mg | INHALATION_SOLUTION | Freq: Four times a day (QID) | RESPIRATORY_TRACT | Status: DC
  Administered 2012-12-21 – 2012-12-22 (×5): 2.5 mg via RESPIRATORY_TRACT
  Filled 2012-12-21 (×4): qty 0.5

## 2012-12-21 MED ORDER — IPRATROPIUM BROMIDE 0.02 % IN SOLN
0.5000 mg | Freq: Four times a day (QID) | RESPIRATORY_TRACT | Status: DC
  Administered 2012-12-21 – 2012-12-22 (×5): 0.5 mg via RESPIRATORY_TRACT
  Filled 2012-12-21 (×5): qty 2.5

## 2012-12-21 MED ORDER — WARFARIN SODIUM 7.5 MG PO TABS
17.5000 mg | ORAL_TABLET | Freq: Once | ORAL | Status: AC
  Administered 2012-12-21: 17.5 mg via ORAL
  Filled 2012-12-21: qty 1

## 2012-12-21 NOTE — Progress Notes (Signed)
Subjective: Patient states that he slept better and was coughing less last night. Cough without much sputum production. He is still having some breathing difficulties and states that he was able to walk around but more slowly than usual because of the breathing. He does feel as though he is getting better and especially since yesterday.   Denies fever, chills, chest pain. Denies nausea and vomiting and complains that the food trays are too small portions and he likes to eat more than that.   Objective: Vital signs in last 24 hours: Filed Vitals:   12/20/12 1333 12/20/12 1447 12/20/12 2100 12/21/12 0508  BP: 123/74  114/69 125/87  Pulse: 62  78 64  Temp: 98.1 F (36.7 C)  98.1 F (36.7 C) 97.8 F (36.6 C)  TempSrc: Oral  Oral Oral  Resp: 18  18 18   Height:      Weight:      SpO2: 98% 98% 98% 96%   Weight change:   Intake/Output Summary (Last 24 hours) at 12/21/12 0931 Last data filed at 12/21/12 0100  Gross per 24 hour  Intake    600 ml  Output      0 ml  Net    600 ml    Physical Exam Blood pressure 125/87, pulse 64, temperature 97.8 F (36.6 C), temperature source Oral, resp. rate 18, height 6' 2.02" (1.88 m), weight 210 lb 6.4 oz (95.437 kg), SpO2 96.00%. General:  No acute distress, alert and oriented x 3, well-appearing AAM HEENT:  PERRL, EOMI, moist mucous membranes Cardiovascular:  Regular rate and rhythm, no murmurs, rubs or gallops Respiratory:  normal RR, minimal coarse breath sounds b/l, expiratory wheezing throughout, less crackles and respiratory sounds than yesterday Abdomen:  Soft, nondistended, nontender, bowel sounds present Extremities:  Warm and well-perfused, no edema.  Skin: Warm, dry, no rashes Neuro: Not anxious appearing, no depressed mood, normal affect  Lab Results: Basic Metabolic Panel:  Recent Labs Lab 12/19/12 0658 12/19/12 1136 12/20/12 0600  NA 137  --  139  K 3.5  --  3.9  CL 99  --  105  CO2 28  --  26  GLUCOSE 126*  --  87  BUN  9  --  9  CREATININE 1.02  --  0.89  CALCIUM 8.7  --  9.0  MG  --  2.3  --    CBC:  Recent Labs Lab 12/19/12 0658 12/20/12 0600  WBC 13.3* 11.1*  NEUTROABS 11.0*  --   HGB 16.6 16.1  HCT 46.4 47.4  MCV 95.1 96.1  PLT 164 180   Cardiac Enzymes:  Recent Labs Lab 12/19/12 0944 12/19/12 1550 12/19/12 2215  TROPONINI <0.30 <0.30 <0.30   Thyroid Function Tests:  Recent Labs Lab 12/19/12 1136  TSH 0.469   Coagulation:  Recent Labs Lab 12/19/12 1136 12/20/12 0600 12/21/12 0550  LABPROT 13.8 13.8 14.1  INR 1.07 1.07 1.10   Studies/Results: Dg Chest 2 View  12/20/2012   *RADIOLOGY REPORT*  Clinical Data: Cough, wheezing, history asthma, smoking, follow-up  CHEST - 2 VIEW  Comparison: 12/19/2012 Correlation:  CT chest 12/19/2012  Findings: Normal heart size, mediastinal contours and pulmonary vascularity. Diffuse peribronchial thickening. Questionable nodular density at the lower left chest without evidence of a pulmonary nodule/mass on preceding CT, suspect summation artifact. No definite acute infiltrate, pleural effusion or pneumothorax. Bones unremarkable.  IMPRESSION: Bronchitic changes. Probable summation artifact lower left chest, with no pulmonary mass/nodule seen at this site on CT exam of  12/19/2012. No acute infiltrate.   Original Report Authenticated By: Ulyses Southward, M.D.   Medications:  Medications reviewed  Scheduled Meds: . albuterol  2.5 mg Nebulization Q6H  . doxycycline  100 mg Oral Q12H  . enoxaparin (LOVENOX) injection  40 mg Subcutaneous Q24H  . ipratropium  0.5 mg Nebulization Q6H  . pantoprazole  40 mg Oral Daily  . predniSONE  60 mg Oral Q breakfast  . sodium chloride  3 mL Intravenous Q12H  . Warfarin - Pharmacist Dosing Inpatient   Does not apply q1800   Continuous Infusions:  PRN Meds:.sodium chloride, acetaminophen, acetaminophen, albuterol, chlorpheniramine-HYDROcodone, ipratropium, morphine injection, ondansetron (ZOFRAN) IV,  ondansetron, sodium chloride  Assessment/Plan:  Asthma Exacerbation CTA negative for PE, no airspace disease. CXR negative on admission.  Possible triggers include viral URI, medication nonadherence. Pt remains afebrile. Also considered: sarcoid (though no evidence on CT scan) Doing better today. Peak flow is over 400 this morning. Expected based on age/ht (6'4'') is 653 -continue duonebs -prednisone  -doxycycline 100mg  BID -O2 as needed, encourage ambulation  Recurrent DVT non compliant with coumadin Patient noncompliant with coumadin over last few months, should be on chronic anticoagulation. Patient open to starting newer agent without need for monitoring, will look into cost with insurance company. -continue warfarin for now, dosing per pharmacy  FEN -regular diet -NSL  DVT ppx -lovenox  Dispo -Likely tomorrow -will need OPC follow up (Dr. Dorise Hiss is PCP)   LOS: 2 days   Genella Mech 12/21/2012, 9:31 AM

## 2012-12-21 NOTE — Progress Notes (Signed)
Resident Co-sign Daily Note: I have seen the patient and reviewed the daily progress note by Kerrie Pleasure, MS 3, and discussed the care of the patient with them.  See my progress note from 12/20/12 for full documentation of my findings, assessment, and plans.   [name] [name] 12/21/2012, 8:41 PM

## 2012-12-21 NOTE — Progress Notes (Signed)
Sputum obtained by RT and sent to lab for c/s

## 2012-12-21 NOTE — Progress Notes (Addendum)
Respiratory therpay note- peak flow this AM-320 ml/min

## 2012-12-21 NOTE — Progress Notes (Signed)
ANTICOAGULATION CONSULT NOTE - Follow Up Consult  Pharmacy Consult for Warfarin Indication: atrial fibrillation  No Known Allergies  Patient Measurements: Height: 6' 2.02" (188 cm) Weight: 210 lb 6.4 oz (95.437 kg) IBW/kg (Calculated) : 82.24  Vital Signs: Temp: 97.8 F (36.6 C) (06/14 0508) Temp src: Oral (06/14 0508) BP: 125/87 mmHg (06/14 0508) Pulse Rate: 64 (06/14 0508)  Labs:  Recent Labs  12/19/12 0658 12/19/12 0944 12/19/12 1136 12/19/12 1550 12/19/12 2215 12/20/12 0600 12/21/12 0550  HGB 16.6  --   --   --   --  16.1  --   HCT 46.4  --   --   --   --  47.4  --   PLT 164  --   --   --   --  180  --   LABPROT  --   --  13.8  --   --  13.8 14.1  INR  --   --  1.07  --   --  1.07 1.10  CREATININE 1.02  --   --   --   --  0.89  --   TROPONINI  --  <0.30  --  <0.30 <0.30  --   --     Estimated Creatinine Clearance: 120.6 ml/min (by C-G formula based on Cr of 0.89).   Assessment: 47 y.o. M who continues on warfarin for hx DVT. The patient is concurrently on low-dose lovenox for VTE prophylaxis while INR <2. INR this morning remains SUBtherapeutic (INR 1.1 << 1.07, goal of 2-3). Hgb/Hct/Plt stable. It is noted that the patient is concurrently on steroids and antibiotics (doxycycline) which can increase warfarin sensitivity -- will monitor closely.  This patient has a known history of warfarin noncompliance (was last seen in the IM anticoagulation clinic on 09/04/11). It is noted that the rounding team is considering alternative agents for this patient. The patient was educated on warfarin today.  Goal of Therapy:  INR 2-3   Plan:  1. Warfarin 17.5 mg x 1 dose at 1800 today 2. Will continue to monitor for any signs/symptoms of bleeding and will follow up with PT/INR in the a.m.  3. Will follow-up with any plans to change oral anticoagulants  Georgina Pillion, PharmD, BCPS Clinical Pharmacist Pager: [phone] 12/21/2012 11:55 AM

## 2012-12-22 MED ORDER — WARFARIN SODIUM 5 MG PO TABS: 15.0000 mg | ORAL_TABLET | Freq: Every day | ORAL | Status: DC

## 2012-12-22 MED ORDER — PREDNISONE 20 MG PO TABS: ORAL_TABLET | ORAL | Status: DC

## 2012-12-22 MED ORDER — MORPHINE SULFATE 10 MG/5ML PO SOLN
2.5000 mg | Freq: Once | ORAL | Status: AC
  Administered 2012-12-22: 2.5 mg via ORAL
  Filled 2012-12-22: qty 5

## 2012-12-22 MED ORDER — BUDESONIDE 0.25 MG/2ML IN SUSP
0.2500 mg | Freq: Once | RESPIRATORY_TRACT | Status: AC
  Administered 2012-12-22: 0.25 mg via RESPIRATORY_TRACT
  Filled 2012-12-22: qty 2

## 2012-12-22 MED ORDER — LORATADINE 5 MG/5ML PO SYRP
10.0000 mg | ORAL_SOLUTION | Freq: Every day | ORAL | Status: DC
  Administered 2012-12-22: 10 mg via ORAL
  Filled 2012-12-22: qty 10

## 2012-12-22 MED ORDER — WARFARIN SODIUM 7.5 MG PO TABS
17.5000 mg | ORAL_TABLET | Freq: Once | ORAL | Status: DC
  Filled 2012-12-22: qty 1

## 2012-12-22 MED ORDER — DOXYCYCLINE HYCLATE 100 MG PO TABS: 100.0000 mg | ORAL_TABLET | Freq: Two times a day (BID) | ORAL | Status: DC

## 2012-12-22 MED ORDER — HYDROCOD POLST-CHLORPHEN POLST 10-8 MG/5ML PO LQCR: 5.0000 mL | Freq: Two times a day (BID) | ORAL | Status: DC | PRN

## 2012-12-22 NOTE — Progress Notes (Signed)
ANTICOAGULATION CONSULT NOTE - Follow Up Consult  Pharmacy Consult for Warfarin Indication: atrial fibrillation  No Known Allergies  Patient Measurements: Height: 6' 2.02" (188 cm) Weight: 210 lb 6.4 oz (95.437 kg) IBW/kg (Calculated) : 82.24  Vital Signs: Temp: 98.6 F (37 C) (06/15 0550) Temp src: Oral (06/15 0550) BP: 112/77 mmHg (06/15 0550) Pulse Rate: 64 (06/15 0550)  Labs:  Recent Labs  12/19/12 0944  12/19/12 1550 12/19/12 2215 12/20/12 0600 12/21/12 0550 12/22/12 0510  HGB  --   --   --   --  16.1  --   --   HCT  --   --   --   --  47.4  --   --   PLT  --   --   --   --  180  --   --   LABPROT  --   < >  --   --  13.8 14.1 17.1*  INR  --   < >  --   --  1.07 1.10 1.43  CREATININE  --   --   --   --  0.89  --   --   TROPONINI <0.30  --  <0.30 <0.30  --   --   --   < > = values in this interval not displayed.  Estimated Creatinine Clearance: 120.6 ml/min (by C-G formula based on Cr of 0.89).   Assessment: SOB  Anticoagulation: Enox 40 for VTE px + Coumadin for h/o DVT. Noted noncompliance. Last known dose to achieve therapeutic INR was 15 mg daily. INR up to 1.43 today.   Goal of Therapy:  INR 2-3   Plan:  1. Warfarin 17.5 mg x 1 dose at 1800 today again 2. Will continue to monitor for any signs/symptoms of bleeding and will follow up with PT/INR in the a.m.  3. Will follow-up with any plans to change oral anticoagulants  Shariya Gaster S. Merilynn Finland, PharmD, [facility] Clinical Staff Pharmacist Pager [phone]  12/22/2012 9:17 AM

## 2012-12-22 NOTE — Discharge Summary (Signed)
Internal Medicine Teaching [facility] Discharge Note  Name: [name] [name] MRN: [id number] DOB: [birth year] 47 y.o.  Date of Admission: 12/19/2012  4:05 AM Date of Discharge: 12/22/2012 Attending Physician: Farley Ly, MD  Discharge Diagnosis: Principal Problem:   Acute asthma exacerbation Active Problems:   Leg DVT (deep venous thromboembolism), chronic   Chest pain   Discharge Medications:   Medication List    TAKE these medications       albuterol 108 (90 BASE) MCG/ACT inhaler  Commonly known as:  PROVENTIL HFA;VENTOLIN HFA  Inhale 2 puffs into the lungs every 6 (six) hours as needed. For breathing     albuterol (2.5 MG/3ML) 0.083% nebulizer solution  Commonly known as:  PROVENTIL  Take 2.5 mg by nebulization every 6 (six) hours as needed for wheezing.     chlorpheniramine-HYDROcodone 10-8 MG/5ML Lqcr  Commonly known as:  TUSSIONEX  Take 5 mLs by mouth every 12 (twelve) hours as needed.     doxycycline 100 MG tablet  Commonly known as:  VIBRA-TABS  Take 1 tablet (100 mg total) by mouth every 12 (twelve) hours.     predniSONE 20 MG tablet  Commonly known as:  DELTASONE  Take 3 tabs in the morning for 3 days, then take 2 pills for 3 days, then take 1 pill for 3 days, then stop.     warfarin 5 MG tablet  Commonly known as:  COUMADIN  Take 3 tablets (15 mg total) by mouth daily.        Disposition and follow-up:   [name] [name] was discharged from [facility] in Stable condition.  At the hospital follow up visit please address breathing status and try to get pre-approval for xarelto. If needed can place papers in PCP's box and Dr. Dorise Hiss will fill them out.  Follow-up Appointments:     Follow-up Information   Follow up with Janalyn Harder, MD On 12/25/2012. (Be at the clinic at 8:40 AM for your appointment)    Contact information:   [address] [place] [address] [phone]      Discharge Orders   Future  Appointments Provider Department Dept Phone   12/25/2012 8:45 AM Linward Headland, MD [facility] [phone]   Future Orders Complete By Expires     Call MD for:  difficulty breathing, headache or visual disturbances  As directed     Diet general  As directed     Increase activity slowly  As directed        Consultations:  None  Procedures Performed:  Dg Chest 2 View  12/20/2012   *RADIOLOGY REPORT*  Clinical Data: Cough, wheezing, history asthma, smoking, follow-up  CHEST - 2 VIEW  Comparison: 12/19/2012 Correlation:  CT chest 12/19/2012  Findings: Normal heart size, mediastinal contours and pulmonary vascularity. Diffuse peribronchial thickening. Questionable nodular density at the lower left chest without evidence of a pulmonary nodule/mass on preceding CT, suspect summation artifact. No definite acute infiltrate, pleural effusion or pneumothorax. Bones unremarkable.  IMPRESSION: Bronchitic changes. Probable summation artifact lower left chest, with no pulmonary mass/nodule seen at this site on CT exam of 12/19/2012. No acute infiltrate.   Original Report Authenticated By: Ulyses Southward, M.D.   Dg Chest 2 View  12/19/2012   *RADIOLOGY REPORT*  Clinical Data: Shortness of breath and asthma.  CHEST - 2 VIEW  Comparison: 04/03/2012  Findings: Shallow inspiration with elevation of the left hemidiaphragm.  Linear atelectasis in  both lung bases, new since previous study.  No focal consolidation or airspace disease.  No blunting of costophrenic angles.  No pneumothorax.  Mediastinal contours appear intact.  Normal heart size and pulmonary vascularity.  IMPRESSION: Shallow inspiration with linear atelectasis in the lung bases.   Original Report Authenticated By: Burman Nieves, M.D.   Ct Angio Chest Pe W/cm &/or Wo Cm  12/19/2012   *RADIOLOGY REPORT*  Clinical Data: 47 year old male with shortness of breath and left chest pain.  History of recurrent DVT.  CT ANGIOGRAPHY CHEST   Technique:  Multidetector CT imaging of the chest using the standard protocol during bolus administration of intravenous contrast. Multiplanar reconstructed images including MIPs were obtained and reviewed to evaluate the vascular anatomy.  Contrast: OMNIPAQUE IOHEXOL 350 MG/ML SOLN  Comparison: 04/03/2012 and prior CTs  Findings: This is a technically adequate study.  No pulmonary emboli are identified. There is no evidence of thoracic aortic aneurysm. The heart and great vessels are within normal limits.  There are no pleural or pericardial effusions present. No enlarged lymph nodes are identified.  The lungs are clear. There is no evidence of nodule, mass, airspace disease, consolidation or endobronchial/endotracheal lesion.  No acute or suspicious bony abnormalities are identified. The visualized upper abdomen is unremarkable.  IMPRESSION: Unremarkable exam - no evidence of pulmonary emboli.   Original Report Authenticated By: Harmon Pier, M.D.   Admission HPI:  [name] [name] is a 47 year old man with a PMH of asthma, chronic DVT, GERD, and upper respiratory cough syndrome who presents to the [facility] ED complaining of worsening SOB that he states started yesterday afternoon about 4 pm. He states that a week prior he was exposed to a co-worker who was sick and he developed a scratchy throat, runny nose, and worsening cough. He has been out of all of his inhalers for at least 3 months including advair and albuterol. He currently denies fevers, chills, nausea, vomiting, or abdominal pain. He has a non-productive cough, rhinorrhea, and mild chest pain when he coughs. The chest pain started after arrival to the ED and is only present when he has a coughing fit.  He has a history of chronic DVT and is supposed to be on life long coumadin. He has not followed up in the clinic since February of 2013 and states that he has been out of his coumadin for "a long time." He states that he made an appointment to follow up that  is scheduled next Wednesday. He denies swelling of the legs, pain in the legs, or chronic shortness of breath.   Hospital Course by problem list:  Acute asthma exacerbation - The patient did come in with acute exacerbation and did require oxygen therapy and steroids while in the hospital. Given the length of his symptoms and cough we did elect to treat him as a bronchitis and gave course of doxycycline. He had CT chest to rule out PE given his history of PE. He improved over the course of the stay and was given prednisone taper with doxycycline on discharge. He was resumed on his advair and albuterol on discharge.   Leg DVT (deep venous thromboembolism), chronic - Patient was non-complaint with coumadin and was restarted without bridge. He did get CT chest and was not found to have PE and no acute DVT episode. He would like to switch to xarelto and will start the prior authorization process as out-patient. He does not like regular clinic visits.   Chest pain -  CT chest to rule out PE given history of DVT and non-compliance with coumadin. He was ruled out for ACS although the possibility was unlikely. He was having pleuritic pain with cough and resolved with tussinex for cough and improvement of his asthma symptoms. He did not have recurrence while he was in the hospital.   Discharge Vitals:  BP 112/77  Pulse 64  Temp(Src) 98.6 F (37 C) (Oral)  Resp 18  Ht 6' 2.02" (1.88 m)  Wt 210 lb 6.4 oz (95.437 kg)  BMI 27 kg/m2  SpO2 96%  Discharge Labs:  Results for orders placed during the hospital encounter of 12/19/12 (from the past 24 hour(s))  CULTURE, EXPECTORATED SPUTUM-ASSESSMENT     Status: None   Collection Time    12/21/12  4:02 PM      Result Value Range   Specimen Description SPUTUM     Special Requests NONE     Sputum evaluation       Value: MICROSCOPIC FINDINGS SUGGEST THAT THIS SPECIMEN IS NOT REPRESENTATIVE OF LOWER RESPIRATORY SECRETIONS. PLEASE RECOLLECT.     CALLED TO Lisette Grinder RN AT 1610 12/21/12 BY WOOLLENK   Report Status 12/21/2012 FINAL    PROTIME-INR     Status: Abnormal   Collection Time    12/22/12  5:10 AM      Result Value Range   Prothrombin Time 17.1 (*) 11.6 - 15.2 seconds   INR 1.43  0.00 - 1.49    Signed: Genella Mech 12/22/2012, 9:44 AM   Time Spent on Discharge: 22 minutes Services Ordered on Discharge: none Equipment Ordered on Discharge: none

## 2012-12-22 NOTE — Progress Notes (Signed)
Subjective: Patient states that he slept better and did have a coughing fit last night. Cough without much sputum production. He is back to his baseline breathing with some minimal SOB with exertion. He states that he needs to go back to work and would like to go home today. His peak flows are improving as well.   Denies fever, chills, chest pain. Denies nausea and vomiting and complains that the food trays are too small portions and he likes to eat more than that.   Objective: Vital signs in last 24 hours: Filed Vitals:   12/21/12 0508 12/21/12 1320 12/21/12 2217 12/22/12 0550  BP: 125/87 123/80 113/67 112/77  Pulse: 64 84 80 64  Temp: 97.8 F (36.6 C) 98.1 F (36.7 C) 97.9 F (36.6 C) 98.6 F (37 C)  TempSrc: Oral Oral Oral Oral  Resp: 18 20 18 18   Height:      Weight:      SpO2: 96% 96% 94% 96%   Weight change:   Intake/Output Summary (Last 24 hours) at 12/22/12 0943 Last data filed at 12/21/12 1054  Gross per 24 hour  Intake      2 ml  Output      0 ml  Net      2 ml    Physical Exam Blood pressure 112/77, pulse 64, temperature 98.6 F (37 C), temperature source Oral, resp. rate 18, height 6' 2.02" (1.88 m), weight 210 lb 6.4 oz (95.437 kg), SpO2 96.00%. General:  No acute distress, alert and oriented x 3, well-appearing AAM HEENT:  PERRL, EOMI, moist mucous membranes Cardiovascular:  Regular rate and rhythm, no murmurs, rubs or gallops Respiratory:  normal RR, minimal coarse breath sounds b/l, expiratory wheezing throughout, less crackles and respiratory sounds than yesterday Abdomen:  Soft, nondistended, nontender, bowel sounds present Extremities:  Warm and well-perfused, no edema.  Skin: Warm, dry, no rashes Neuro: Not anxious appearing, no depressed mood, normal affect  Lab Results: Basic Metabolic Panel:  Recent Labs Lab 12/19/12 0658 12/19/12 1136 12/20/12 0600  NA 137  --  139  K 3.5  --  3.9  CL 99  --  105  CO2 28  --  26  GLUCOSE 126*  --  87   BUN 9  --  9  CREATININE 1.02  --  0.89  CALCIUM 8.7  --  9.0  MG  --  2.3  --    CBC:  Recent Labs Lab 12/19/12 0658 12/20/12 0600  WBC 13.3* 11.1*  NEUTROABS 11.0*  --   HGB 16.6 16.1  HCT 46.4 47.4  MCV 95.1 96.1  PLT 164 180   Cardiac Enzymes:  Recent Labs Lab 12/19/12 0944 12/19/12 1550 12/19/12 2215  TROPONINI <0.30 <0.30 <0.30   Thyroid Function Tests:  Recent Labs Lab 12/19/12 1136  TSH 0.469   Coagulation:  Recent Labs Lab 12/19/12 1136 12/20/12 0600 12/21/12 0550 12/22/12 0510  LABPROT 13.8 13.8 14.1 17.1*  INR 1.07 1.07 1.10 1.43   Studies/Results: Dg Chest 2 View  12/20/2012   *RADIOLOGY REPORT*  Clinical Data: Cough, wheezing, history asthma, smoking, follow-up  CHEST - 2 VIEW  Comparison: 12/19/2012 Correlation:  CT chest 12/19/2012  Findings: Normal heart size, mediastinal contours and pulmonary vascularity. Diffuse peribronchial thickening. Questionable nodular density at the lower left chest without evidence of a pulmonary nodule/mass on preceding CT, suspect summation artifact. No definite acute infiltrate, pleural effusion or pneumothorax. Bones unremarkable.  IMPRESSION: Bronchitic changes. Probable summation artifact lower left chest,  with no pulmonary mass/nodule seen at this site on CT exam of 12/19/2012. No acute infiltrate.   Original Report Authenticated By: Ulyses Southward, M.D.   Medications:  Medications reviewed  Scheduled Meds: . albuterol  2.5 mg Nebulization Q6H  . doxycycline  100 mg Oral Q12H  . enoxaparin (LOVENOX) injection  40 mg Subcutaneous Q24H  . ipratropium  0.5 mg Nebulization Q6H  . loratadine  10 mg Oral Daily  . pantoprazole  40 mg Oral Daily  . predniSONE  60 mg Oral Q breakfast  . sodium chloride  3 mL Intravenous Q12H  . warfarin  17.5 mg Oral ONCE-1800  . Warfarin - Pharmacist Dosing Inpatient   Does not apply q1800   Continuous Infusions:  PRN Meds:.sodium chloride, acetaminophen, acetaminophen,  albuterol, chlorpheniramine-HYDROcodone, ipratropium, morphine injection, ondansetron (ZOFRAN) IV, ondansetron, sodium chloride  Assessment/Plan:  Asthma Exacerbation CTA negative for PE, no airspace disease. CXR negative on admission.  Possible triggers include viral URI, medication nonadherence. Pt remains afebrile. Also considered: sarcoid (though no evidence on CT scan) Doing better today. Peak flow is over 400 this morning. Expected based on age/ht (6'4'') is 653 -continue duonebs -prednisone  -doxycycline 100mg  BID -O2 as needed, encourage ambulation  Recurrent DVT non compliant with coumadin Patient noncompliant with coumadin over last few months, should be on chronic anticoagulation. Patient open to starting newer agent without need for monitoring, will look into cost with insurance company. -continue warfarin for now, dosing per pharmacy  FEN -regular diet -NSL  DVT ppx -lovenox  Dispo Home today. -[facility] clinic visit on Wed 6/18 at 8:45   LOS: 3 days   Genella Mech 12/22/2012, 9:43 AM

## 2012-12-25 ENCOUNTER — Ambulatory Visit: Payer: Medicaid - Out of State | Admitting: Internal Medicine

## 2013-01-02 ENCOUNTER — Encounter: Payer: Self-pay | Admitting: Internal Medicine

## 2013-01-02 ENCOUNTER — Ambulatory Visit (INDEPENDENT_AMBULATORY_CARE_PROVIDER_SITE_OTHER): Payer: 59 | Admitting: Internal Medicine

## 2013-01-02 VITALS — BP 118/77 | HR 78 | Ht 76.0 in | Wt 218.7 lb

## 2013-01-02 DIAGNOSIS — I82501 Chronic embolism and thrombosis of unspecified deep veins of right lower extremity: Secondary | ICD-10-CM

## 2013-01-02 DIAGNOSIS — J45909 Unspecified asthma, uncomplicated: Secondary | ICD-10-CM

## 2013-01-02 DIAGNOSIS — F17200 Nicotine dependence, unspecified, uncomplicated: Secondary | ICD-10-CM

## 2013-01-02 DIAGNOSIS — I82509 Chronic embolism and thrombosis of unspecified deep veins of unspecified lower extremity: Secondary | ICD-10-CM

## 2013-01-02 MED ORDER — ALBUTEROL SULFATE HFA 108 (90 BASE) MCG/ACT IN AERS: 2.0000 | INHALATION_SPRAY | Freq: Four times a day (QID) | RESPIRATORY_TRACT | Status: DC | PRN

## 2013-01-02 MED ORDER — RIVAROXABAN 20 MG PO TABS: 20.0000 mg | ORAL_TABLET | Freq: Every day | ORAL | Status: DC

## 2013-01-02 MED ORDER — FLUTICASONE-SALMETEROL 250-50 MCG/DOSE IN AEPB: 1.0000 | INHALATION_SPRAY | Freq: Two times a day (BID) | RESPIRATORY_TRACT | Status: DC

## 2013-01-02 MED ORDER — ALBUTEROL SULFATE (2.5 MG/3ML) 0.083% IN NEBU: 2.5000 mg | INHALATION_SOLUTION | Freq: Four times a day (QID) | RESPIRATORY_TRACT | Status: DC | PRN

## 2013-01-02 MED ORDER — DOXYCYCLINE HYCLATE 100 MG PO TABS: 100.0000 mg | ORAL_TABLET | Freq: Two times a day (BID) | ORAL | Status: DC

## 2013-01-02 NOTE — Patient Instructions (Signed)
General Instructions: Your asthma exacerbation appears to have improved. -we have re-sent the prescription for doxycycline, to finish your course of this medication -instead of prednisone, we are prescribing Advair, an inhaler to use twice per day, which will improve your lung health in the long-term -we have refilled your prescriptions for Albuterol inhaler and nebulizer  For your leg clots in the past, it is important to continue to take a blood thinner.  Since you were unable to take Warfarin, we are starting Xarelto, 1 tablet once per day.  We are obtaining prior authorization from your pharmacy within the next few days.  Quitting smoking is still the best thing you can do for your health.  When you are ready to quit, we can try one of the medications we discussed today.  Please return for a follow-up visit in 1-2 months.   Treatment Goals:  Goals (1 Years of Data) as of 01/02/13   None      Progress Toward Treatment Goals:  Treatment Goal 01/02/2013  Stop smoking smoking the same amount    Self Care Goals & Plans:  Self Care Goal 01/02/2013  Manage my medications take my medicines as prescribed; refill my medications on time  Eat healthy foods drink diet soda or water instead of juice or soda; eat foods that are low in salt  Be physically active take a walk every day  Stop smoking (No Data)       Care Management & Community Referrals:  Referral 01/02/2013  Referrals made for care management support none needed

## 2013-01-02 NOTE — Assessment & Plan Note (Signed)
The patient has a long-standing history of asthma.  He was recently hospitalized for an acute asthma exacerbation.  He notes that his breathing has improved, and he is using albuterol <once/day.  He never filled doxycycline, prednisone, or albuterol -represcribed doxy to complete course, since patient still has symptoms of cough, horseness, which could represent only partially treated bronchitis/atypical pneumonia -will not re-prescribe prednisone, since breathing is back to normal -refilled albuterol -started Advair, which patient states he has taken in the past

## 2013-01-02 NOTE — Progress Notes (Signed)
HPI The patient is a 47 y.o. male with a history of asthma, tobacco abuse, prior DVT, presenting for a follow-up visit.  The patient has a history of 2 prior DVT's in his right leg, which happened 8-10 years ago.  The patient notes compliance with warfarin for 8 years, but stopped taking this last year, because he was tired of coming to clinic visits for INR checks.  The patient notes no leg pain presently.  He hasn't taken warfarin since hospital discharge; prior to that his last dose was 6 months ago.  The patient notes that his breathing has improved since hospital discharge.  He has been out of albuterol since hospital discharge.  He has been using his step-daughter's inhaler, once yesterday, none today.  He notes that his breathing is better, but he still notes cough productive of clear sputum.  Yes congestion, horseness.  No fevers, no wheezing.  The patient is an active smoker, smokes 1/2 pdd for the last 24 years.  The patient has thought about quitting.  He has tried the patches in the past, as well as electronic cigarettes, with no success.  He is not ready to quit at this time, but continues to think about quitting frequently.  ROS: General: no fevers, chills, changes in weight, changes in appetite Skin: no rash HEENT: no blurry vision, hearing changes, sore throat Pulm: no dyspnea, coughing, wheezing CV: no chest pain, palpitations, shortness of breath Abd: no abdominal pain, nausea/vomiting, diarrhea/constipation GU: no dysuria, hematuria, polyuria Ext: no arthralgias, myalgias Neuro: no weakness, numbness, or tingling  Filed Vitals:   01/02/13 0828  BP: 118/77  Pulse: 78    PEX General: alert, cooperative, and in no apparent distress HEENT: pupils equal round and reactive to light, vision grossly intact, oropharynx clear and non-erythematous  Neck: supple, no lymphadenopathy Lungs: clear to ascultation bilaterally, normal work of respiration, no wheezes, rales,  ronchi Heart: regular rate and rhythm, no murmurs, gallops, or rubs Abdomen: soft, non-tender, non-distended, normal bowel sounds Extremities: no cyanosis, clubbing, or edema Neurologic: alert & oriented X3, cranial nerves II-XII intact, strength grossly intact, sensation intact to light touch  Current Outpatient Prescriptions on File Prior to Visit  Medication Sig Dispense Refill  . albuterol (PROVENTIL HFA;VENTOLIN HFA) 108 (90 BASE) MCG/ACT inhaler Inhale 2 puffs into the lungs every 6 (six) hours as needed. For breathing  1 Inhaler  6  . albuterol (PROVENTIL) (2.5 MG/3ML) 0.083% nebulizer solution Take 2.5 mg by nebulization every 6 (six) hours as needed for wheezing.      . chlorpheniramine-HYDROcodone (TUSSIONEX) 10-8 MG/5ML LQCR Take 5 mLs by mouth every 12 (twelve) hours as needed.  115 mL  0  . doxycycline (VIBRA-TABS) 100 MG tablet Take 1 tablet (100 mg total) by mouth every 12 (twelve) hours.  8 tablet  0  . predniSONE (DELTASONE) 20 MG tablet Take 3 tabs in the morning for 3 days, then take 2 pills for 3 days, then take 1 pill for 3 days, then stop.  18 tablet  0  . warfarin (COUMADIN) 5 MG tablet Take 3 tablets (15 mg total) by mouth daily.  30 tablet  0   No current facility-administered medications on file prior to visit.    Assessment/Plan

## 2013-01-02 NOTE — Assessment & Plan Note (Signed)
The patient had 2 prior R leg DVT's in the past, for which he is on lifelong anticoagulation.  He tried Warfarin, but was non-compliant with INR checks after several years of compliance.  We will try to get Xarelto approved. -prescribed Xarelto, will pursue prior authorization

## 2013-01-02 NOTE — Progress Notes (Signed)
Case discussed with Dr. Brown at the time of the visit.  We reviewed the resident's history and exam and pertinent patient test results.  I agree with the assessment, diagnosis, and plan of care documented in the resident's note. 

## 2013-01-02 NOTE — Assessment & Plan Note (Signed)
  Assessment: Progress toward smoking cessation:  smoking the same amount Barriers to progress toward smoking cessation:  lack of motivation to quit;withdrawal symptoms Comments: Patient states he thinks about quitting, but isn't ready to try quitting at this time  Plan: Instruction/counseling given:  I counseled patient on the dangers of tobacco use, advised patient to stop smoking, and reviewed strategies to maximize success. Educational resources provided:    Self management tools provided:    Medications to assist with smoking cessation:  None Patient agreed to the following self-care plans for smoking cessation:  (NOT READY TO QUIT AT THIS TIME)  Other plans: Can re-address at next visit.  Patient given handouts on smoking cessation and quitline.  Patient may benefit from Chantix in the future.

## 2013-03-28 DIAGNOSIS — Z91199 Patient's noncompliance with other medical treatment and regimen due to unspecified reason: Secondary | ICD-10-CM | POA: Insufficient documentation

## 2013-03-28 DIAGNOSIS — I2699 Other pulmonary embolism without acute cor pulmonale: Principal | ICD-10-CM | POA: Insufficient documentation

## 2013-03-28 DIAGNOSIS — I82409 Acute embolism and thrombosis of unspecified deep veins of unspecified lower extremity: Secondary | ICD-10-CM | POA: Insufficient documentation

## 2013-03-28 DIAGNOSIS — Z9119 Patient's noncompliance with other medical treatment and regimen: Secondary | ICD-10-CM | POA: Insufficient documentation

## 2013-03-28 DIAGNOSIS — R05 Cough: Secondary | ICD-10-CM | POA: Insufficient documentation

## 2013-03-28 DIAGNOSIS — R0602 Shortness of breath: Secondary | ICD-10-CM | POA: Insufficient documentation

## 2013-03-28 DIAGNOSIS — R059 Cough, unspecified: Secondary | ICD-10-CM | POA: Insufficient documentation

## 2013-03-28 DIAGNOSIS — Z7901 Long term (current) use of anticoagulants: Secondary | ICD-10-CM | POA: Insufficient documentation

## 2013-03-28 DIAGNOSIS — R079 Chest pain, unspecified: Secondary | ICD-10-CM | POA: Insufficient documentation

## 2013-03-28 DIAGNOSIS — R071 Chest pain on breathing: Secondary | ICD-10-CM | POA: Insufficient documentation

## 2013-03-28 DIAGNOSIS — F17200 Nicotine dependence, unspecified, uncomplicated: Secondary | ICD-10-CM | POA: Insufficient documentation

## 2013-03-29 ENCOUNTER — Emergency Department (HOSPITAL_COMMUNITY): Payer: 59

## 2013-03-29 ENCOUNTER — Observation Stay (HOSPITAL_COMMUNITY)
Admission: EM | Admit: 2013-03-29 | Discharge: 2013-03-30 | Disposition: A | Discharge status: Home / Self Care | Payer: 59 | Attending: Internal Medicine | Admitting: Internal Medicine

## 2013-03-29 ENCOUNTER — Encounter (HOSPITAL_COMMUNITY): Payer: Self-pay | Admitting: Emergency Medicine

## 2013-03-29 ENCOUNTER — Emergency Department (HOSPITAL_COMMUNITY)
Admit: 2013-03-29 | Discharge: 2013-03-29 | Disposition: A | Discharge status: Home / Self Care | Payer: 59 | Attending: Emergency Medicine | Admitting: Emergency Medicine

## 2013-03-29 DIAGNOSIS — I2699 Other pulmonary embolism without acute cor pulmonale: Secondary | ICD-10-CM | POA: Diagnosis present

## 2013-03-29 DIAGNOSIS — J4540 Moderate persistent asthma, uncomplicated: Secondary | ICD-10-CM | POA: Diagnosis present

## 2013-03-29 DIAGNOSIS — R0781 Pleurodynia: Secondary | ICD-10-CM | POA: Diagnosis present

## 2013-03-29 DIAGNOSIS — F1410 Cocaine abuse, uncomplicated: Secondary | ICD-10-CM

## 2013-03-29 DIAGNOSIS — F17200 Nicotine dependence, unspecified, uncomplicated: Secondary | ICD-10-CM

## 2013-03-29 DIAGNOSIS — F1910 Other psychoactive substance abuse, uncomplicated: Secondary | ICD-10-CM | POA: Diagnosis present

## 2013-03-29 DIAGNOSIS — Z7901 Long term (current) use of anticoagulants: Secondary | ICD-10-CM

## 2013-03-29 LAB — CBC
HCT: 49.6 % (ref 39.0–52.0)
Hemoglobin: 18 g/dL — ABNORMAL HIGH (ref 13.0–17.0)
MCH: 34.2 pg — ABNORMAL HIGH (ref 26.0–34.0)
MCHC: 36.3 g/dL — ABNORMAL HIGH (ref 30.0–36.0)
RBC: 5.26 MIL/uL (ref 4.22–5.81)

## 2013-03-29 LAB — IRON AND TIBC
Saturation Ratios: 21 % (ref 20–55)
TIBC: 156 ug/dL — ABNORMAL LOW (ref 215–435)

## 2013-03-29 LAB — RETICULOCYTES
RBC.: 4.98 MIL/uL (ref 4.22–5.81)
Retic Count, Absolute: 49.8 10*3/uL (ref 19.0–186.0)

## 2013-03-29 LAB — POCT I-STAT, CHEM 8
BUN: 15 mg/dL (ref 6–23)
Creatinine, Ser: 1.2 mg/dL (ref 0.50–1.35)
Glucose, Bld: 94 mg/dL (ref 70–99)
Hemoglobin: 18.4 g/dL — ABNORMAL HIGH (ref 13.0–17.0)
Potassium: 3.4 mEq/L — ABNORMAL LOW (ref 3.5–5.1)
TCO2: 23 mmol/L (ref 0–100)

## 2013-03-29 LAB — RAPID URINE DRUG SCREEN, HOSP PERFORMED
Barbiturates: NOT DETECTED
Tetrahydrocannabinol: NOT DETECTED

## 2013-03-29 MED ORDER — PREDNISONE 20 MG PO TABS
60.0000 mg | ORAL_TABLET | Freq: Once | ORAL | Status: AC
  Administered 2013-03-29: 60 mg via ORAL
  Filled 2013-03-29: qty 3

## 2013-03-29 MED ORDER — SODIUM CHLORIDE 0.9 % IJ SOLN
3.0000 mL | Freq: Two times a day (BID) | INTRAMUSCULAR | Status: DC
  Administered 2013-03-29: 10:00:00 3 mL via INTRAVENOUS

## 2013-03-29 MED ORDER — RIVAROXABAN 15 MG PO TABS
15.0000 mg | ORAL_TABLET | Freq: Two times a day (BID) | ORAL | Status: DC
  Administered 2013-03-29 – 2013-03-30 (×3): 15 mg via ORAL
  Filled 2013-03-29 (×4): qty 1

## 2013-03-29 MED ORDER — HYDROMORPHONE HCL PF 1 MG/ML IJ SOLN
INTRAMUSCULAR | Status: AC
  Filled 2013-03-29: qty 1

## 2013-03-29 MED ORDER — ALBUTEROL SULFATE (5 MG/ML) 0.5% IN NEBU: 2.5000 mg | INHALATION_SOLUTION | Freq: Four times a day (QID) | RESPIRATORY_TRACT | Status: DC | PRN

## 2013-03-29 MED ORDER — HEPARIN (PORCINE) IN NACL 100-0.45 UNIT/ML-% IJ SOLN
1500.0000 [IU]/h | INTRAMUSCULAR | Status: DC
  Filled 2013-03-29: qty 250

## 2013-03-29 MED ORDER — HEPARIN BOLUS VIA INFUSION
5500.0000 [IU] | Freq: Once | INTRAVENOUS | Status: DC
  Filled 2013-03-29: qty 5500

## 2013-03-29 MED ORDER — ALBUTEROL SULFATE (5 MG/ML) 0.5% IN NEBU
5.0000 mg | INHALATION_SOLUTION | Freq: Once | RESPIRATORY_TRACT | Status: AC
  Administered 2013-03-29: 5 mg via RESPIRATORY_TRACT
  Filled 2013-03-29: qty 1

## 2013-03-29 MED ORDER — KETOROLAC TROMETHAMINE 30 MG/ML IJ SOLN
30.0000 mg | Freq: Four times a day (QID) | INTRAMUSCULAR | Status: DC | PRN
  Administered 2013-03-29 – 2013-03-30 (×3): 30 mg via INTRAVENOUS
  Filled 2013-03-29 (×3): qty 1

## 2013-03-29 MED ORDER — HYDROMORPHONE HCL PF 1 MG/ML IJ SOLN
1.0000 mg | Freq: Once | INTRAMUSCULAR | Status: AC
  Administered 2013-03-29: 1 mg via INTRAVENOUS
  Filled 2013-03-29: qty 1

## 2013-03-29 MED ORDER — SODIUM CHLORIDE 0.9 % IJ SOLN: 3.0000 mL | INTRAMUSCULAR | Status: DC | PRN

## 2013-03-29 MED ORDER — HEPARIN SODIUM (PORCINE) 5000 UNIT/ML IJ SOLN: 60.0000 [IU]/kg | Freq: Once | INTRAMUSCULAR | Status: DC

## 2013-03-29 MED ORDER — SODIUM CHLORIDE 0.9 % IJ SOLN
3.0000 mL | Freq: Two times a day (BID) | INTRAMUSCULAR | Status: DC
  Administered 2013-03-29 – 2013-03-30 (×3): 3 mL via INTRAVENOUS

## 2013-03-29 MED ORDER — SODIUM CHLORIDE 0.9 % IV SOLN: 250.0000 mL | INTRAVENOUS | Status: DC | PRN

## 2013-03-29 MED ORDER — IOHEXOL 350 MG/ML SOLN
100.0000 mL | Freq: Once | INTRAVENOUS | Status: AC | PRN
  Administered 2013-03-29: 100 mL via INTRAVENOUS

## 2013-03-29 MED ORDER — HYDROMORPHONE HCL PF 1 MG/ML IJ SOLN
1.0000 mg | Freq: Once | INTRAMUSCULAR | Status: AC
  Administered 2013-03-29: 1 mg via INTRAVENOUS

## 2013-03-29 MED ORDER — POTASSIUM CHLORIDE CRYS ER 20 MEQ PO TBCR
20.0000 meq | EXTENDED_RELEASE_TABLET | Freq: Once | ORAL | Status: AC
  Administered 2013-03-29: 20 meq via ORAL
  Filled 2013-03-29: qty 1

## 2013-03-29 MED ORDER — ONDANSETRON HCL 4 MG/2ML IJ SOLN
4.0000 mg | Freq: Once | INTRAMUSCULAR | Status: AC
  Administered 2013-03-29: 4 mg via INTRAVENOUS
  Filled 2013-03-29: qty 2

## 2013-03-29 NOTE — H&P (Signed)
  Date: 03/29/2013  Patient name: [name] [name]  Medical record number: [id number]  Date of birth: 1965/10/20   I have seen and evaluated [name] [name] and discussed their care with the Residency Team. [name] was admitted for recurrent, acute PE after stopping his Xarelto for no particular reason. He is hemodynamically stable and oxygenating well.   Assessment and Plan: I have seen and evaluated the patient as outlined above. I agree with the formulated Assessment and Plan as detailed in the residents' admission note, with the following changes:   1. Acute, hemodynamically stable PE - pt was placed back on appropriate dosing of Xarelto. He will need to cont this as an outpt, [facility] consult - can they help improve compliance?   Possible D/C in AM.  Burns Spain, MD 9/20/20141:04 PM

## 2013-03-29 NOTE — ED Notes (Signed)
Nebulizer treatment in progress, O2 sat= 100% .

## 2013-03-29 NOTE — ED Notes (Signed)
Pt is still in pain

## 2013-03-29 NOTE — ED Notes (Addendum)
Pt. reports SOB with productive cough /wheezing onset today unrelieved by home nebulizer treatment , at also reports chest congestion / left lateral ribcage pain with deep inspiration and palpation .

## 2013-03-29 NOTE — ED Provider Notes (Signed)
CSN: [id number]     Arrival date & time 03/28/13  2359 History   First MD Initiated Contact with Patient 03/29/13 0157     Chief Complaint  Patient presents with  . Shortness of Breath  . Cough   (Consider location/radiation/quality/duration/timing/severity/associated sxs/prior Treatment) HPI History provided by patient. History of DVT, quit taking his medications about a month ago. He is now developed left-sided chest pain and shortness of breath. He feels like he has some wheezing also with history of asthma and tobacco use. No fevers. No leg pain or leg swelling. Symptoms moderate to severe and worse with exertion. Pain is sharp in quality and not radiating, worse with deep inspiration Past Medical History  Diagnosis Date  . Asthma     hospitalized in past, questionable hx of bronchitis dx  . DVT (deep venous thrombosis)     times 2, on coumadin chronically  . Depression     hx of SI  . Substance abuse     crack, cocaine, last use 2007  . Tobacco abuse   . GERD (gastroesophageal reflux disease)   . DVT (deep venous thrombosis)   . Bronchitis   . Heart murmur   . Shortness of breath   . Tuberculosis     ' I test Positive "   Past Surgical History  Procedure Laterality Date  . Skin graft     No family history on file. History  Substance Use Topics  . Smoking status: Current Every Day Smoker -- 0.10 packs/day for 23 years    Types: Cigarettes  . Smokeless tobacco: Former Neurosurgeon  . Alcohol Use: Yes     Comment: OCCASIONAL    Review of Systems  Constitutional: Negative for fever and chills.  HENT: Negative for neck pain and neck stiffness.   Eyes: Negative for pain.  Respiratory: Positive for cough and shortness of breath.   Cardiovascular: Positive for chest pain.  Gastrointestinal: Negative for abdominal pain.  Genitourinary: Negative for dysuria.  Musculoskeletal: Negative for back pain.  Skin: Negative for rash.  Neurological: Negative for headaches.  All other  systems reviewed and are negative.    Allergies  Review of patient's allergies indicates no known allergies.  Home Medications   Current Outpatient Rx  Name  Route  Sig  Dispense  Refill  . albuterol (PROVENTIL) (2.5 MG/3ML) 0.083% nebulizer solution   Nebulization   Take 3 mLs (2.5 mg total) by nebulization every 6 (six) hours as needed for wheezing.   75 mL   11   . Rivaroxaban (XARELTO) 20 MG TABS   Oral   Take 1 tablet (20 mg total) by mouth daily.   30 tablet   11    BP 113/85  Pulse 81  Temp(Src) 98.1 F (36.7 C) (Oral)  Resp 18  SpO2 100% Physical Exam  Constitutional: He is oriented to person, place, and time. He appears well-developed and well-nourished.  HENT:  Head: Normocephalic and atraumatic.  Eyes: EOM are normal. Pupils are equal, round, and reactive to light.  Neck: Neck supple.  Cardiovascular: Normal rate, regular rhythm and intact distal pulses.   Pulmonary/Chest: Effort normal. He exhibits no tenderness.  Tachypnea with coarse bilateral breath sounds  Abdominal: Soft. Bowel sounds are normal. He exhibits no distension.  Musculoskeletal: Normal range of motion. He exhibits no edema.  No calf tenderness or unilateral swelling  Neurological: He is alert and oriented to person, place, and time.  Skin: Skin is warm and dry.  ED Course  Procedures (including critical care time) Labs Review Labs Reviewed  CBC - Abnormal; Notable for the following:    Hemoglobin 18.0 (*)    MCH 34.2 (*)    MCHC 36.3 (*)    All other components within normal limits  POCT I-STAT, CHEM 8 - Abnormal; Notable for the following:    Potassium 3.4 (*)    Calcium, Ion 1.10 (*)    Hemoglobin 18.4 (*)    HCT 54.0 (*)    All other components within normal limits   Imaging Review Dg Ribs Unilateral W/chest Left  03/29/2013   CLINICAL DATA:  Left chest pain.  EXAM: LEFT RIBS AND CHEST - 3+ VIEW  COMPARISON:  12/20/2012  FINDINGS: No pneumothorax. No effusion. Linear  scarring or subsegmental atelectasis laterally at the right lung base. Detailed views of left ribs reveal no displaced fracture or other focal lesion.  IMPRESSION: Negative.   Electronically Signed   By: Oley Balm M.D.   On: 03/29/2013 01:17   Ct Angio Chest Pe W/cm &/or Wo Cm  03/29/2013   CLINICAL DATA:  Left chest pain, shortness of Breath.  EXAM: CT ANGIOGRAPHY CHEST WITH CONTRAST  TECHNIQUE: Multidetector CT imaging of the chest was performed using the standard protocol during bolus administration of intravenous contrast. Multiplanar CT image reconstructions including MIPs were obtained to evaluate the vascular anatomy.  CONTRAST:  OMNIPAQUE IOHEXOL 350 MG/ML SOLN  COMPARISON:  12/19/2012  FINDINGS: New pulmonary emboli in the medial and posterior right lower lobe segmental pulmonary artery branches, and on the left more centrally partially obstructing anterior and lateral basal segment branches of the pulmonary artery. Incomplete opacification of the thoracic aorta which is grossly unremarkable. No pleural or pericardial effusion. No hilar or mediastinal adenopathy. Dependent atelectasis in both lower lobes. No confluent airspace consolidation. Thoracic spine and sternum intact.  Review of the MIP images confirms the above findings.  IMPRESSION: Bilateral pulmonary emboli, new  since previous exam.  I telephoned the critical test results to Dr. Dierdre Highman at the time of interpretation.   Electronically Signed   By: Oley Balm M.D.   On: 03/29/2013 04:08   IV Dilaudid. Prednisone and albuterol.  CT scan reviewed as above and heparin initiated. Discussed with outpatient clinics resident on call, Dr. Shirlee Latch - Will admit MDM  Diagnosis: Pulmonary embolism  Chest x-ray, CT scan, labs IV narcotics IV heparin Medical admission    Sunnie Nielsen, MD 03/29/13 [phone]

## 2013-03-29 NOTE — Progress Notes (Signed)
ANTICOAGULATION CONSULT NOTE - Initial Consult  Pharmacy Consult for heparin Indication: pulmonary embolus  No Known Allergies  Patient Measurements:   Heparin Dosing Weight: 90 kg  Vital Signs: Temp: 98.1 F (36.7 C) (09/20 0005) Temp src: Oral (09/20 0005) BP: 98/61 mmHg (09/20 0402) Pulse Rate: 77 (09/20 0402)  Labs:  Recent Labs  03/29/13 0228 03/29/13 0236  HGB 18.4* 18.0*  HCT 54.0* 49.6  PLT  --  177  CREATININE 1.20  --     The CrCl is unknown because both a height and weight (above a minimum accepted value) are required for this calculation.   Medical History: Past Medical History  Diagnosis Date  . Asthma     hospitalized in past, questionable hx of bronchitis dx  . DVT (deep venous thrombosis)     times 2, on coumadin chronically  . Depression     hx of SI  . Substance abuse     crack, cocaine, last use 2007  . Tobacco abuse   . GERD (gastroesophageal reflux disease)   . DVT (deep venous thrombosis)   . Bronchitis   . Heart murmur   . Shortness of breath   . Tuberculosis     ' I test Positive "    Medications:   (Not in a hospital admission)  Assessment: 47 yo man with h/o DVT on chronic AC who stopped meds about a month ago admitted with SOB and cough found to have PE. Goal of Therapy:  Heparin level 0.3-0.7 units/ml Monitor platelets by anticoagulation protocol: Yes   Plan:  Heparin bolus 5500 units and drip at 1500 units/hr Check heparin level 6 hours after bolus. Daily HL and CBC. F/u start oral anticoagulation  Lester Platas Poteet 03/29/2013,4:21 AM

## 2013-03-29 NOTE — ED Notes (Signed)
Introduced self to the pt.  Pt is relaxing waiting to go to CT

## 2013-03-29 NOTE — Progress Notes (Signed)
SATURATION QUALIFICATIONS: (This note is used to comply with regulatory documentation for home oxygen)  Patient Saturations on Room Air at Rest = 100%  Patient Saturations on Room Air while Ambulating = 99%  Patient Saturations on 0 Liters of oxygen while Ambulating = 99%  Please briefly explain why patient needs home oxygen: 

## 2013-03-29 NOTE — H&P (Signed)
Date: 03/29/2013               Patient Name:  [name] [name] MRN: [id number]  DOB: 09-May-1966 Age / Sex: 47 y.o., male   PCP: No Pcp Per PatientDr. Dorise Hiss?         Medical Service: Internal Medicine Teaching Service         Attending Physician: Dr. Rogelia Boga    First Contact: Dr. Darci Needle Pager: [phone]  Second Contact: Dr. Sherrine Maples Pager: [phone]       After Hours (After 5p/  First Contact Pager: [phone]  weekends / holidays): Second Contact Pager: [phone]   Chief Complaint: shortness of breath  History of Present Illness:  Mr. 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 GERD, tobacco abuse who presents with shortness of breath and left sided chest pain that began at 11am yesterday (9/19).  Pain is sharp, non-radiating, worse with deep inspiration.  Patient states he can not take deep breaths or cough due to pain.  He has had DVTs in right leg x 2 with now chronic DVT in that leg.  He was previously on Coumadin for anticoagulation with history of medication noncompliance thus was switched to Xarelto on 01/02/13.  However, about one month ago, he stopped taking Xarelto "because he is hard headed."  No financial constraints as his insurance pays in full for this medication.  He has cut down his tobacco use, currently smoking about 3 cigarettes/day.  Denies hemoptysis.   No chest pain other than left sided pleuritic pain.  He has chronic swelling of his right lower leg due to chronic DVT but no increase from baseline, no pain or erythema.   No fevers, headache, lightheadedness, weakness, nausea/vomiting, no change in bowel or bladder habits.   CTA in ED showed bilateral PEs.  Pt also received breathing treatment x2 in ED.  Meds: No current facility-administered medications for this encounter.   Current Outpatient Prescriptions  Medication Sig Dispense Refill  . albuterol (PROVENTIL) (2.5 MG/3ML) 0.083% nebulizer solution Take  3 mLs (2.5 mg total) by nebulization every 6 (six) hours as needed for wheezing.  75 mL  11  . Rivaroxaban (XARELTO) 20 MG TABS Take 1 tablet (20 mg total) by mouth daily.  30 tablet  11   Facility-Administered Medications Ordered in Other Encounters  Medication Dose Route Frequency Provider Last Rate Last Dose  . HYDROmorphone (DILAUDID) 1 MG/ML injection           Proventil nebulizer, Xarelto  Allergies: Allergies as of 03/28/2013  . (No Known Allergies)   Past Medical History  Diagnosis Date  . Asthma     hospitalized in past, questionable hx of bronchitis dx  . DVT (deep venous thrombosis)     times 2, on coumadin chronically  . Depression     hx of SI  . Substance abuse     crack, cocaine, last use 2007  . Tobacco abuse   . GERD (gastroesophageal reflux disease)   . DVT (deep venous thrombosis)   . Bronchitis   . Heart murmur   . Shortness of breath   . Tuberculosis     ' I test Positive "   Past Surgical History  Procedure Laterality Date  . Skin graft     No family history on file. History   Social History  . Marital Status: Single    Spouse Name: N/A    Number  of Children: N/A  . Years of Education: N/A   Occupational History  . [employer]    Social History Main Topics  . Smoking status: Current Every Day Smoker -- 0.10 packs/day for 23 years    Types: Cigarettes  . Smokeless tobacco: Former Neurosurgeon  . Alcohol Use: Yes     Comment: OCCASIONAL  . Drug Use: No  . Sexual Activity: Yes   Other Topics Concern  . Not on file   Social History Narrative   Working at [employer] currently.  States he has insurance through them now.              Review of Systems: Review of Systems  Constitutional: Negative for fever, chills, weight loss and malaise/fatigue.  Eyes: Negative for blurred vision.  Respiratory: Positive for shortness of breath. Negative for cough, hemoptysis and wheezing.   Cardiovascular: Positive for chest pain and leg  swelling. Negative for palpitations.  Gastrointestinal: Negative for nausea, vomiting, abdominal pain, diarrhea, constipation and blood in stool.  Genitourinary: Negative for dysuria.  Musculoskeletal: Negative for falls.  Neurological: Negative for dizziness, tingling, focal weakness, loss of consciousness, weakness and headaches.    Physical Exam: Blood pressure 109/72, pulse 73, temperature 98.1 F (36.7 C), temperature source Oral, resp. rate 18, SpO2 98.00%. General: alert, cooperative, and in distress due to pain  HEENT: vision grossly intact, oropharynx clear and non-erythematous  Neck: supple, no lymphadenopathy, JVD, or carotid bruits Lungs: mildly tachypneic, clear to ascultation bilaterally, no wheezes, rales, ronchi Heart: regular rate and rhythm, no murmurs, gallops, or rubs Abdomen: soft, non-tender, non-distended, normal bowel sounds Extremities: no calf tenderness or erythema, no cyanosis, clubbing, or edema Neurologic: alert & oriented X3, cranial nerves II-XII intact, strength grossly intact, sensation intact to light touch   Lab results: Basic Metabolic Panel:  Recent Labs  16/10/96 0228  NA 138  K 3.4*  CL 105  GLUCOSE 94  BUN 15  CREATININE 1.20   CBC:  Recent Labs  03/29/13 0228 03/29/13 0236  WBC  --  8.8  HGB 18.4* 18.0*  HCT 54.0* 49.6  MCV  --  94.3  PLT  --  177    Imaging results:  Dg Ribs Unilateral W/chest Left  03/29/2013   CLINICAL DATA:  Left chest pain.  EXAM: LEFT RIBS AND CHEST - 3+ VIEW  COMPARISON:  12/20/2012  FINDINGS: No pneumothorax. No effusion. Linear scarring or subsegmental atelectasis laterally at the right lung base. Detailed views of left ribs reveal no displaced fracture or other focal lesion.  IMPRESSION: Negative.   Electronically Signed   By: Oley Balm M.D.   On: 03/29/2013 01:17   Ct Angio Chest Pe W/cm &/or Wo Cm  03/29/2013   CLINICAL DATA:  Left chest pain, shortness of Breath.  EXAM: CT ANGIOGRAPHY  CHEST WITH CONTRAST  TECHNIQUE: Multidetector CT imaging of the chest was performed using the standard protocol during bolus administration of intravenous contrast. Multiplanar CT image reconstructions including MIPs were obtained to evaluate the vascular anatomy.  CONTRAST:  OMNIPAQUE IOHEXOL 350 MG/ML SOLN  COMPARISON:  12/19/2012  FINDINGS: New pulmonary emboli in the medial and posterior right lower lobe segmental pulmonary artery branches, and on the left more centrally partially obstructing anterior and lateral basal segment branches of the pulmonary artery. Incomplete opacification of the thoracic aorta which is grossly unremarkable. No pleural or pericardial effusion. No hilar or mediastinal adenopathy. Dependent atelectasis in both lower lobes. No confluent airspace consolidation.  Thoracic spine and sternum intact.  Review of the MIP images confirms the above findings.  IMPRESSION: Bilateral pulmonary emboli, new  since previous exam.  I telephoned the critical test results to Dr. Dierdre Highman at the time of interpretation.   Electronically Signed   By: Oley Balm M.D.   On: 03/29/2013 04:08    Other results: EKG: pending  Assessment & Plan by Problem: #Pulmonary embolism- Pt with sudden onset SOB and pleuritic pain since 11a yesterday.  Tachycardic to low 90s.  Known history of right lower extremity DVTs.  Recent switch from Coumadin to Xarelto in 12/2012 due to medication noncompliance. However, pt noncompliant with Xarelto for past month as well.  Revised Geneva score 6 (moderate risk). CTA with bilateral pulmonary emboli new since prior.  Xray of left ribs negative.  -telemetry -re-start Xarelto 15 mg PO BID x 21 days then resume 20 mg daily with outpatient follow-up -emphasized importance of compliance with Xarelto for anticoagulation -toradol 30 mg IV q6h prn pain  #Tobacco abuse- Pt smoking about 3 cigarettes/day, not interested in patch.  -smoking cessation counseling  #DVT PPX-  per above  #Code status- Full code.    Dispo: Disposition is deferred at this time, awaiting improvement of current medical problems. Anticipated discharge in approximately 2-3 day(s).   The patient does not have a current PCP (No Pcp Per Patient) and does need an [facility] hospital follow-up appointment after discharge.   Signed: Rocco Serene, MD 03/29/2013, 6:24 AM

## 2013-03-30 DIAGNOSIS — D45 Polycythemia vera: Secondary | ICD-10-CM

## 2013-03-30 LAB — CBC
HCT: 44.2 % (ref 39.0–52.0)
Hemoglobin: 16.1 g/dL (ref 13.0–17.0)
MCH: 34.4 pg — ABNORMAL HIGH (ref 26.0–34.0)
RBC: 4.68 MIL/uL (ref 4.22–5.81)
WBC: 9.7 10*3/uL (ref 4.0–10.5)

## 2013-03-30 LAB — BASIC METABOLIC PANEL
Chloride: 103 mEq/L (ref 96–112)
GFR calc Af Amer: >90 mL/min (ref >90)
Potassium: 4 mEq/L (ref 3.5–5.1)
Sodium: 136 mEq/L (ref 135–145)

## 2013-03-30 MED ORDER — KETOROLAC TROMETHAMINE 10 MG PO TABS: 10.0000 mg | ORAL_TABLET | Freq: Four times a day (QID) | ORAL | Status: DC | PRN

## 2013-03-30 NOTE — Discharge Summary (Signed)
Name: [name] [name] MRN: [id number] DOB: 25-Jul-1965 47 y.o. PCP: No Pcp Per Patient  Date of Admission: 03/29/2013  1:22 AM Date of Discharge: 03/30/2013 Attending Physician: Burns Spain, MD  Discharge Diagnosis: Principal Problem:   Pulmonary embolism, bilateral- 2/2 medication noncompliance with chronic DVT Active Problems:   Asthma, chronic   Tobacco abuse- patient down to smoking 3 cigarettes per day   Pleuritic chest pain- w/ bilateral PEs  Discharge Medications:   Medication List         albuterol (2.5 MG/3ML) 0.083% nebulizer solution  Commonly known as:  PROVENTIL  Take 3 mLs (2.5 mg total) by nebulization every 6 (six) hours as needed for wheezing.     ketorolac 10 MG tablet  Commonly known as:  TORADOL  Take 1 tablet (10 mg total) by mouth every 6 (six) hours as needed for pain.     Rivaroxaban 20 MG Tabs tablet  Commonly known as:  XARELTO  Take 1 tablet (20 mg total) by mouth daily.       Disposition and follow-up:   [name] [name] was discharged from [facility] in Stable condition.  At the hospital follow up visit please address:  1.  Polycythemia- unclear etiology; may be 2/2 chronic hypoxia given patient is smoker in the setting of chronic DVTs and recent bilateral PEs; may need further work up with CBC and EPO level; anemia panel done this admission, but ferritin level still pending 2. Bilateral Pulmonary Emboli- patient with h/o medication noncompliance, please make sure patient continues to take his xarelto; we re-started Xarelto 15 mg PO BID x 21 days (On day 2 now, 04/18/2013 will be day 21) then will resume 20 mg daily  2.  Labs / imaging needed at time of follow-up: CBC, EPO level  3.  Pending labs/ test needing follow-up: ferritin level  Follow-up Appointments:     Follow-up Information   Follow up with Genella Mech, MD. Schedule an appointment as soon as possible for a visit in 1 week.   Specialty:  Internal  Medicine   Contact information:   [address] [place] [address] [phone]       Discharge Instructions: Discharge Orders   Future Orders Complete By Expires   Call MD for:  difficulty breathing, headache or visual disturbances  As directed    Call MD for:  severe uncontrolled pain  As directed    Call MD for:  temperature >100.4  As directed    Diet - low sodium heart healthy  As directed    Increase activity slowly  As directed       Consultations:  none  Procedures Performed:  Dg Ribs Unilateral W/chest Left  03/29/2013   CLINICAL DATA:  Left chest pain.  EXAM: LEFT RIBS AND CHEST - 3+ VIEW  COMPARISON:  12/20/2012  FINDINGS: No pneumothorax. No effusion. Linear scarring or subsegmental atelectasis laterally at the right lung base. Detailed views of left ribs reveal no displaced fracture or other focal lesion.  IMPRESSION: Negative.   Electronically Signed   By: Oley Balm M.D.   On: 03/29/2013 01:17   Ct Angio Chest Pe W/cm &/or Wo Cm  03/29/2013   CLINICAL DATA:  Left chest pain, shortness of Breath.  EXAM: CT ANGIOGRAPHY CHEST WITH CONTRAST  TECHNIQUE: Multidetector CT imaging of the chest was performed using the standard protocol during bolus administration of intravenous contrast. Multiplanar CT image reconstructions including MIPs were obtained to evaluate  the vascular anatomy.  CONTRAST:  OMNIPAQUE IOHEXOL 350 MG/ML SOLN  COMPARISON:  12/19/2012  FINDINGS: New pulmonary emboli in the medial and posterior right lower lobe segmental pulmonary artery branches, and on the left more centrally partially obstructing anterior and lateral basal segment branches of the pulmonary artery. Incomplete opacification of the thoracic aorta which is grossly unremarkable. No pleural or pericardial effusion. No hilar or mediastinal adenopathy. Dependent atelectasis in both lower lobes. No confluent airspace consolidation. Thoracic spine and sternum intact.  Review of the MIP  images confirms the above findings.  IMPRESSION: Bilateral pulmonary emboli, new  since previous exam.  I telephoned the critical test results to Dr. Dierdre Highman at the time of interpretation.   Electronically Signed   By: Oley Balm M.D.   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 GERD, tobacco abuse who presents with shortness of breath and left sided chest pain that began at 11am yesterday (9/19). Pain is sharp, non-radiating, worse with deep inspiration. Patient states he can not take deep breaths or cough due to pain. He has had DVTs in right leg x 2 with now chronic DVT in that leg. He was previously on Coumadin for anticoagulation with history of medication noncompliance thus was switched to Xarelto on 01/02/13. However, about one month ago, he stopped taking Xarelto "because he is hard headed." No financial constraints as his insurance pays in full for this medication. He has cut down his tobacco use, currently smoking about 3 cigarettes/day. Denies hemoptysis.   No chest pain other than left sided pleuritic pain. He has chronic swelling of his right lower leg due to chronic DVT but no increase from baseline, no pain or erythema.   No fevers, headache, lightheadedness, weakness, nausea/vomiting, no change in bowel or bladder habits.  CTA in ED showed bilateral PEs. Pt also received breathing treatment x2 in ED.  Hospital Course by problem list:  #Pulmonary embolism- Pt with sudden onset SOB and pleuritic chest pain on morning of 9/19. Patient was afebrile and not tachycardic on admission. Patient with known history of right lower extremity DVTs. Recent switch from Coumadin to Xarelto in 12/2012 due to medication noncompliance. However, pt noncompliant with Xarelto for past month as well. CTA done in ED revealed new bilateral pulmonary emboli. Xray of left ribs negative. Patient was monitored  on telemetry. We re-started Xarelto 15 mg PO BID x 21 days (On day 2 now, 04/18/2013 will be day 21) then will resume 20 mg daily with outpatient follow-up in [facility]. I personally emphasized the importance of compliance with Xarelto for anticoagulation to the patient. He voiced understanding. Patient managed on toradol 30 mg IV q6h prn pain. I discharged patient with toradol PO.  #Tobacco and cocaine abuse- Pt smoking about 3 cigarettes/day, not interested in patch. He reports he is trying to quit. I emphasized the importance of quitting for his history of blood clots, but also his general health. UDS positive for cocaine this admission.   #Polycythemia- Hb 18 yesterday, down to 16.1 on morning of discharge. Per chart review, it appears that patient's baseline Hb is 16-17. This is likely reactive 2/2 chronic hypoxia from patient's smoking combined with chronic DVTs w/ his current PE. However, he may benefit from outpatient work up with EPO level. Patient also iron deficient (Fe level 32) with low TIBC and normal reticulocyte percent, unclear etiology.  Ferritin pending at time of discharge. Will need outpatient follow up.   Discharge Vitals:   BP 99/59  Pulse 60  Temp(Src) 98.1 F (36.7 C) (Oral)  Resp 18  Ht 6\' 4"  (1.93 m)  Wt 97 kg (213 lb 13.5 oz)  BMI 26.04 kg/m2  SpO2 98%  Discharge Labs:  Results for orders placed during the hospital encounter of 03/29/13 (from the past 24 hour(s))  IRON AND TIBC     Status: Abnormal   Collection Time    03/29/13  4:15 PM      Result Value Range   Iron 32 (*) 42 - 135 ug/dL   TIBC 161 (*) 096 - 045 ug/dL   Saturation Ratios 21  20 - 55 %   UIBC 124 (*) 125 - 400 ug/dL  RETICULOCYTES     Status: None   Collection Time    03/29/13  4:15 PM      Result Value Range   Retic Ct Pct 1.0  0.4 - 3.1 %   RBC. 4.98  4.22 - 5.81 MIL/uL   Retic Count, Manual 49.8  19.0 - 186.0 K/uL  BASIC METABOLIC PANEL     Status: None   Collection Time    03/30/13  7:45  AM      Result Value Range   Sodium 136  135 - 145 mEq/L   Potassium 4.0  3.5 - 5.1 mEq/L   Chloride 103  96 - 112 mEq/L   CO2 22  19 - 32 mEq/L   Glucose, Bld 96  70 - 99 mg/dL   BUN 14  6 - 23 mg/dL   Creatinine, Ser 4.09  0.50 - 1.35 mg/dL   Calcium 8.6  8.4 - 81.1 mg/dL   GFR calc non Af Amer >90  >90 mL/min   GFR calc Af Amer >90  >90 mL/min  CBC     Status: Abnormal   Collection Time    03/30/13  7:45 AM      Result Value Range   WBC 9.7  4.0 - 10.5 K/uL   RBC 4.68  4.22 - 5.81 MIL/uL   Hemoglobin 16.1  13.0 - 17.0 g/dL   HCT 91.4  78.2 - 95.6 %   MCV 94.4  78.0 - 100.0 fL   MCH 34.4 (*) 26.0 - 34.0 pg   MCHC 36.4 (*) 30.0 - 36.0 g/dL   RDW 21.3  08.6 - 57.8 %   Platelets 197  150 - 400 K/uL    Signed: Windell Hummingbird, MD 03/30/2013, 11:18 AM   Time Spent on Discharge: 35 minutes Services Ordered on Discharge: none Equipment Ordered on Discharge: none

## 2013-03-30 NOTE — Progress Notes (Signed)
Verbalized understanding of discharge instructions.  Personnel items returned, see belongings note.  Information regarding pulmonary embolus given to client to read, information reinforced verbally with teach back method.

## 2013-03-30 NOTE — Progress Notes (Signed)
Pt ambulated in hallway, both sides and BP after--109/62. Denies any complaints

## 2013-03-30 NOTE — Progress Notes (Signed)
Utilization Review completed.  

## 2013-03-30 NOTE — Progress Notes (Signed)
Subjective: Patient feels as though is CP is much better than it was on admission. He has some "tightness" to his L lateral chest that is worse with deep inspiration, but the rest of the CP has resolved. Denies CP. Does have cough, but reports this is not new and is due to "smoking." I discussed with patient the importance of medication compliance as well as smoking cessation. He reports good understanding.   Objective: Vital signs in last 24 hours: Filed Vitals:   03/29/13 1300 03/29/13 2121 03/30/13 0202 03/30/13 0604  BP: 97/55 102/67 107/61 99/59  Pulse: 54 69 51 60  Temp: 97.8 F (36.6 C) 98.5 F (36.9 C) 98.3 F (36.8 C) 98.1 F (36.7 C)  TempSrc: Oral Oral Oral Oral  Resp: 20 19 18 18   Height:      Weight:    97 kg (213 lb 13.5 oz)  SpO2: 100% 97% 97% 98%   Weight change:   Intake/Output Summary (Last 24 hours) at 03/30/13 0957 Last data filed at 03/30/13 0925  Gross per 24 hour  Intake    840 ml  Output    900 ml  Net    -60 ml   Physical Exam General: alert, cooperative, and in no apparent distress HEENT: NCAT, vision grossly intact, MMM Neck: supple Lungs: clear to ascultation bilaterally, normal work of respiration, no wheezes, rales, ronchi; L lateral chest wall  Soft tissue TTP  Heart: regular rate and rhythm, no murmurs, gallops, or rubs Abdomen: soft, non-tender, non-distended, normal bowel sounds Extremities: warm extremities; no pitting edema to BLE though R leg appears larger than L leg; no TTP to calves Neurologic: alert & oriented X3, cranial nerves II-XII intact, strength grossly intact, sensation intact to light touch  Lab Results: Basic Metabolic Panel:  Recent Labs Lab 03/29/13 0228 03/30/13 0745  NA 138 136  K 3.4* 4.0  CL 105 103  CO2  --  22  GLUCOSE 94 96  BUN 15 14  CREATININE 1.20 0.93  CALCIUM  --  8.6   CBC:  Recent Labs Lab 03/29/13 0236 03/30/13 0745  WBC 8.8 9.7  HGB 18.0* 16.1  HCT 49.6 44.2  MCV 94.3 94.4  PLT  177 197   Anemia Panel:  Recent Labs Lab 03/29/13 1615  TIBC 156*  IRON 32*  RETICCTPCT 1.0   Urine Drug Screen: Drugs of Abuse     Component Value Date/Time   LABOPIA NONE DETECTED 03/29/2013 0638   COCAINSCRNUR POSITIVE* 03/29/2013 0638   LABBENZ NONE DETECTED 03/29/2013 0638   AMPHETMU NONE DETECTED 03/29/2013 0638   THCU NONE DETECTED 03/29/2013 0638   LABBARB NONE DETECTED 03/29/2013 1610     Micro Results: No results found for this or any previous visit (from the past 240 hour(s)). Studies/Results: Dg Ribs Unilateral W/chest Left  03/29/2013   CLINICAL DATA:  Left chest pain.  EXAM: LEFT RIBS AND CHEST - 3+ VIEW  COMPARISON:  12/20/2012  FINDINGS: No pneumothorax. No effusion. Linear scarring or subsegmental atelectasis laterally at the right lung base. Detailed views of left ribs reveal no displaced fracture or other focal lesion.  IMPRESSION: Negative.   Electronically Signed   By: Oley Balm M.D.   On: 03/29/2013 01:17   Ct Angio Chest Pe W/cm &/or Wo Cm  03/29/2013   CLINICAL DATA:  Left chest pain, shortness of Breath.  EXAM: CT ANGIOGRAPHY CHEST WITH CONTRAST  TECHNIQUE: Multidetector CT imaging of the chest was performed using the standard protocol  during bolus administration of intravenous contrast. Multiplanar CT image reconstructions including MIPs were obtained to evaluate the vascular anatomy.  CONTRAST:  OMNIPAQUE IOHEXOL 350 MG/ML SOLN  COMPARISON:  12/19/2012  FINDINGS: New pulmonary emboli in the medial and posterior right lower lobe segmental pulmonary artery branches, and on the left more centrally partially obstructing anterior and lateral basal segment branches of the pulmonary artery. Incomplete opacification of the thoracic aorta which is grossly unremarkable. No pleural or pericardial effusion. No hilar or mediastinal adenopathy. Dependent atelectasis in both lower lobes. No confluent airspace consolidation. Thoracic spine and sternum intact.  Review of  the MIP images confirms the above findings.  IMPRESSION: Bilateral pulmonary emboli, new  since previous exam.  I telephoned the critical test results to Dr. Dierdre Highman at the time of interpretation.   Electronically Signed   By: Oley Balm M.D.   On: 03/29/2013 04:08   Medications: I have reviewed the patient's current medications. Scheduled Meds: . rivaroxaban  15 mg Oral BID  . sodium chloride  3 mL Intravenous Q12H  . sodium chloride  3 mL Intravenous Q12H   Continuous Infusions:  PRN Meds:.sodium chloride, albuterol, ketorolac, sodium chloride  Assessment/Plan:  #Pulmonary embolism- Pt with sudden onset SOB and pleuritic pain on morning of 9/19. Patient was afebrile and not tachycardic on admission. Patient with known history of right lower extremity DVTs. Recent switch from Coumadin to Xarelto in 12/2012 due to medication noncompliance. However, pt noncompliant with Xarelto for past month as well. CTA revealed bilateral pulmonary emboli new since prior. Xray of left ribs negative.  -telemetry  -re-started Xarelto 15 mg PO BID x 21 days (On day 2 now, 04/18/2013 will be day 21) then resume 20 mg daily with outpatient follow-up in [facility] -emphasized importance of compliance with Xarelto for anticoagulation  -toradol 30 mg IV q6h prn pain   #Tobacco and cocaine abuse- Pt smoking about 3 cigarettes/day, not interested in patch. He reports he is trying to quit. I emphasized the importance of quitting.  -UDS + cocaine  #Polycythemia- Hb 18 yesterday, down to 16.1 today. Per chart review, it appears that patient's baseline Hb is 16-17. This is likely reactive, though would benefit from outpatient work up with EPO level. Patient also iron deficient (Fe level 32) with low TIBC and normal reticulocyte percent. Ferritin pending. Unclear etiology at this time. Will need outpatient follow up.   #DVT PPX- per above   #Code status- Full code.   Dispo: Likely discharge today.  The patient does not  have a current PCP (No Pcp Per Patient) and does need an [facility] hospital follow-up appointment after discharge.  The patient does not have transportation limitations that hinder transportation to clinic appointments.  .Services Needed at time of discharge: Y = Yes, Blank = No PT:   OT:   RN:   Equipment:   Other:     LOS: 1 day   Windell Hummingbird, MD 03/30/2013, 9:57 AM

## 2013-03-31 LAB — VITAMIN B12: Vitamin B-12: 654 pg/mL (ref 211–911)

## 2013-03-31 LAB — FERRITIN: Ferritin: 327 ng/mL — ABNORMAL HIGH (ref 22–322)

## 2013-04-04 ENCOUNTER — Ambulatory Visit: Payer: 59 | Admitting: Internal Medicine

## 2013-04-07 ENCOUNTER — Encounter: Payer: Self-pay | Admitting: Internal Medicine

## 2013-04-07 ENCOUNTER — Ambulatory Visit (INDEPENDENT_AMBULATORY_CARE_PROVIDER_SITE_OTHER): Payer: 59 | Admitting: Internal Medicine

## 2013-04-07 VITALS — BP 117/74 | HR 74 | Temp 98.2°F | Ht 76.0 in | Wt 223.5 lb

## 2013-04-07 DIAGNOSIS — F17200 Nicotine dependence, unspecified, uncomplicated: Secondary | ICD-10-CM

## 2013-04-07 DIAGNOSIS — I2699 Other pulmonary embolism without acute cor pulmonale: Secondary | ICD-10-CM

## 2013-04-07 DIAGNOSIS — Z0000 Encounter for general adult medical examination without abnormal findings: Secondary | ICD-10-CM

## 2013-04-07 DIAGNOSIS — D45 Polycythemia vera: Secondary | ICD-10-CM

## 2013-04-07 DIAGNOSIS — J45909 Unspecified asthma, uncomplicated: Secondary | ICD-10-CM

## 2013-04-07 DIAGNOSIS — D751 Secondary polycythemia: Secondary | ICD-10-CM

## 2013-04-07 DIAGNOSIS — Z23 Encounter for immunization: Secondary | ICD-10-CM

## 2013-04-07 NOTE — Patient Instructions (Signed)
1. You have done great job in taking all your medications. I appreciate it very much. Please continue doing that. 2. Please take all medications as prescribed.  3. If you have worsening of your symptoms or new symptoms arise, please call the clinic ([phone]), or go to the ER immediately if symptoms are severe.     

## 2013-04-07 NOTE — Assessment & Plan Note (Signed)
-  will give flu shot today.  

## 2013-04-07 NOTE — Assessment & Plan Note (Signed)
It is stable. Patient does not have cough, chest pain, shortness of breath.  Lung auscultation is clear bilaterally. Will continue current regimen.

## 2013-04-07 NOTE — Progress Notes (Signed)
Patient ID: [name] [name], male   DOB: 1966/07/05, 47 y.o.   MRN: [id number] Subjective:   Patient ID: [name] [name] male   DOB: [birth year] 47 y.o.   MRN: [id number]  CC:   Hospital followup visit.    HPI:  [name] is a 47 y.o. man  with past medical history as outlined below, who presents for a hospital followup visit today.  1. PE: Patient was hospitalized from 9/2 to 9/21 because of pulmonary embolism. Patient has chronic DVT X 2 in right leg (on Rivaroxiban since 12/2012) He was previously on Coumadin for anticoagulation with history of medication noncompliance thus was switched to Xarelto on 01/02/13. However, he stopped taking Xarelto. He developed SOB and chest pain. CTA showed PE, new pulmonary emboli in the medial and posterior right lower lobe segmental pulmonary artery branches, and on the left more centrally partially obstructing anterior and lateral basal segment branches of the pulmonary artery. Patient was discharged on Xarelto. The patient reports that he has been doing well up to he was discharged. He does not have chest pain, shortness of breath. His oxygen saturation is 99% on room air.  # Polycythemia- Recent Hgb was 18-->16. It appears that patient's baseline Hb is 16-17. This is likely reactive 2/2 chronic hypoxia from patient's smoking combined with chronic DVTs w/ his current PE. Patient also has iron deficient (Fe level 32) with low TIBC and normal reticulocyte percent. His ferritin was 327.   #: Asthma: Patient is using albuterol nebulizer. He does not have chest pain, cough, shortness of breath or wheezing.  ROS:  Denies fever, chills, fatigue, headaches, cough, chest pain, SOB, abdominal pain, diarrhea, constipation, dysuria, urgency, frequency, hematuria, joint pain or leg swelling.  Past Medical History  Diagnosis Date  . Asthma     hospitalized in past, questionable hx of bronchitis dx  . DVT (deep venous thrombosis)     times 2, on coumadin chronically  .  Depression     hx of SI  . Substance abuse     crack, cocaine, last use 2007  . Tobacco abuse   . GERD (gastroesophageal reflux disease)   . DVT (deep venous thrombosis)   . Bronchitis   . Heart murmur   . Shortness of breath   . Tuberculosis     ' I test Positive "  . Anginal pain   . Pneumonia    Current Outpatient Prescriptions  Medication Sig Dispense Refill  . albuterol (PROVENTIL) (2.5 MG/3ML) 0.083% nebulizer solution Take 3 mLs (2.5 mg total) by nebulization every 6 (six) hours as needed for wheezing.  75 mL  11  . ketorolac (TORADOL) 10 MG tablet Take 1 tablet (10 mg total) by mouth every 6 (six) hours as needed for pain.  30 tablet  0  . Rivaroxaban (XARELTO) 20 MG TABS Take 1 tablet (20 mg total) by mouth daily.  30 tablet  11   No current facility-administered medications for this visit.   No family history on file. History   Social History  . Marital Status: Single    Spouse Name: N/A    Number of Children: N/A  . Years of Education: N/A   Occupational History  . Details Cars    Social History Main Topics  . Smoking status: Current Every Day Smoker -- 0.10 packs/day for 23 years    Types: Cigarettes  . Smokeless tobacco: Current User  . Alcohol Use: Yes     Comment: OCCASIONAL  .  Drug Use: No  . Sexual Activity: Yes   Other Topics Concern  . None   Social History Narrative   Working at [employer] currently.  States he has insurance through them now.              Review of Systems: Full 14-point review of systems otherwise negative. See HPI.   Objective:  Physical Exam: Filed Vitals:   04/07/13 1529  BP: 117/74  Pulse: 74  Temp: 98.2 F (36.8 C)  TempSrc: Oral  Height: 6\' 4"  (1.93 m)  Weight: 223 lb 8 oz (101.379 kg)  SpO2: 99%   Constitutional: Vital signs reviewed.  Patient is a well-developed and well-nourished, in no acute distress and cooperative with exam.   HEENT:  Head: Normocephalic and atraumatic Mouth: no  erythema or exudates, MMM Eyes: PERRL, EOMI, conjunctivae normal, No scleral icterus.  Neck: Supple, Trachea midline normal ROM, No JVD  Cardiovascular: RRR, S1 normal, S2 normal, no MRG, pulses symmetric and intact bilaterally Pulmonary/Chest: CTAB, no wheezes, rales, or rhonchi Abdominal: Soft. Non-tender, non-distended, bowel sounds are normal, no masses, organomegaly, or guarding present.  GU: no CVA tenderness Musculoskeletal: No joint deformities, erythema, or stiffness, ROM full and non-tender Extremities: No leg edema Hematology: no cervical, inginal, or axillary adenopathy.  Neurological: A&O x3, Strength is normal and symmetric bilaterally, cranial nerve II-XII are grossly intact, no focal motor deficit, sensory intact to light touch bilaterally. Brachial reflex 2+ bilaterally.  Skin: Warm, dry and intact. No rash, cyanosis, or clubbing.  Psychiatric: Normal mood and affect. No suicidal or homicidal ideation.  Assessment & Plan:

## 2013-04-07 NOTE — Assessment & Plan Note (Signed)
It is most likely secondary to his smoking. Patient's anemia panel showed chronic disease iron deficiency pattern. His iron was 32, ferritin 327, low TIBC and a normal saturation. Patient used to use cocaine, which may explain the chronic inflammation. His recent hemoglobin was 16, which does not meet the criteria for polycythemia vera. Will not check EPO level today. Will repeat CBC in 3 month, if hemoglobin still significantly elevated after he quit smoking, may consider check EPO level.

## 2013-04-07 NOTE — Assessment & Plan Note (Signed)
It is stable. Patient is compliant to Xarelto. He does not have shortness of breath and chest pain. His oxygen saturation is 99% at room air today. Will continue current regimen.

## 2013-04-07 NOTE — Assessment & Plan Note (Signed)
  Assessment: Progress toward smoking cessation:  smoking less Barriers to progress toward smoking cessation:    Comments:   Plan: Instruction/counseling given:  I counseled patient on the dangers of tobacco use, advised patient to stop smoking, and reviewed strategies to maximize success. Educational resources provided:  [employer]) brochure Self management tools provided:    Medications to assist with smoking cessation:   Patient agreed to the following self-care plans for smoking cessation:    Other plans: Patient smokes less. He used to smoke half-pack a day for 5 years. He cut down his smoking to one cigarette per day. He is currently using electronic cigarettes.

## 2013-04-09 NOTE — Progress Notes (Signed)
Case discussed with Dr. Niu at the time of the visit.  We reviewed the resident's history and exam and pertinent patient test results.  I agree with the assessment, diagnosis, and plan of care documented in the resident's note.    

## 2013-05-30 ENCOUNTER — Encounter (HOSPITAL_COMMUNITY): Payer: Self-pay | Admitting: Emergency Medicine

## 2013-05-30 ENCOUNTER — Emergency Department (HOSPITAL_COMMUNITY)
Admission: EM | Admit: 2013-05-30 | Discharge: 2013-05-30 | Disposition: A | Discharge status: Home / Self Care | Payer: 59 | Attending: Emergency Medicine | Admitting: Emergency Medicine

## 2013-05-30 ENCOUNTER — Emergency Department (HOSPITAL_COMMUNITY): Payer: 59

## 2013-05-30 DIAGNOSIS — I209 Angina pectoris, unspecified: Secondary | ICD-10-CM | POA: Insufficient documentation

## 2013-05-30 DIAGNOSIS — Z86718 Personal history of other venous thrombosis and embolism: Secondary | ICD-10-CM | POA: Insufficient documentation

## 2013-05-30 DIAGNOSIS — Z8701 Personal history of pneumonia (recurrent): Secondary | ICD-10-CM | POA: Insufficient documentation

## 2013-05-30 DIAGNOSIS — R011 Cardiac murmur, unspecified: Secondary | ICD-10-CM | POA: Insufficient documentation

## 2013-05-30 DIAGNOSIS — Z79899 Other long term (current) drug therapy: Secondary | ICD-10-CM | POA: Insufficient documentation

## 2013-05-30 DIAGNOSIS — Z8719 Personal history of other diseases of the digestive system: Secondary | ICD-10-CM | POA: Insufficient documentation

## 2013-05-30 DIAGNOSIS — M25529 Pain in unspecified elbow: Secondary | ICD-10-CM | POA: Insufficient documentation

## 2013-05-30 DIAGNOSIS — F17200 Nicotine dependence, unspecified, uncomplicated: Secondary | ICD-10-CM | POA: Insufficient documentation

## 2013-05-30 DIAGNOSIS — J45909 Unspecified asthma, uncomplicated: Secondary | ICD-10-CM | POA: Insufficient documentation

## 2013-05-30 DIAGNOSIS — M25511 Pain in right shoulder: Secondary | ICD-10-CM

## 2013-05-30 DIAGNOSIS — Z8611 Personal history of tuberculosis: Secondary | ICD-10-CM | POA: Insufficient documentation

## 2013-05-30 DIAGNOSIS — Z8659 Personal history of other mental and behavioral disorders: Secondary | ICD-10-CM | POA: Insufficient documentation

## 2013-05-30 MED ORDER — HYDROCODONE-ACETAMINOPHEN 5-325 MG PO TABS: 1.0000 | ORAL_TABLET | Freq: Four times a day (QID) | ORAL | Status: DC | PRN

## 2013-05-30 MED ORDER — HYDROCODONE-ACETAMINOPHEN 5-325 MG PO TABS
1.0000 | ORAL_TABLET | Freq: Once | ORAL | Status: AC
  Administered 2013-05-30: 1 via ORAL
  Filled 2013-05-30: qty 1

## 2013-05-30 NOTE — ED Notes (Signed)
Pt states that he has been having right shoulder pain for awhile now. Pt states that when he moves his shoulder out to the side his pain radiates to his neck. Pt states that he can only lay down with his shoulder above his head. Pt alert and oriented able to move all extremities and follow commands.

## 2013-05-30 NOTE — ED Provider Notes (Signed)
CSN: [id number]     Arrival date & time 05/30/13  4098 History   First MD Initiated Contact with Patient 05/30/13 [phone]     Chief Complaint  Patient presents with  . Shoulder Pain   (Consider location/radiation/quality/duration/timing/severity/associated sxs/prior Treatment) HPI Comments: Patient is a 47 year old male past medical history significant for asthma, depression, polysubstance abuse, GERD presenting to the ED for 1-1/2 months of severe intermittently sharp right shoulder pain with radiation to scapular area. Patient states he does not remember any precipitating event or factors that brought his shoulder pain on. His arm or any falls or trauma. He states his shoulder has been bothering him more and more recently. He denies any alleviating factors. He states most movements aggravate his shoulder. He denies any previous shoulder injuries. Patient is right-handed. He denies any numbness or weakness to the extremity, chest pain, shortness of breath, nausea, vomiting, diaphoresis. He denies any exertional CP or SOB.    Past Medical History  Diagnosis Date  . Asthma     hospitalized in past, questionable hx of bronchitis dx  . DVT (deep venous thrombosis)     times 2, on coumadin chronically  . Depression     hx of SI  . Substance abuse     crack, cocaine, last use 2007  . Tobacco abuse   . GERD (gastroesophageal reflux disease)   . DVT (deep venous thrombosis)   . Bronchitis   . Heart murmur   . Shortness of breath   . Tuberculosis     ' I test Positive "  . Anginal pain   . Pneumonia    Past Surgical History  Procedure Laterality Date  . Skin graft     History reviewed. No pertinent family history. History  Substance Use Topics  . Smoking status: Current Every Day Smoker -- 0.10 packs/day for 23 years    Types: Cigarettes  . Smokeless tobacco: Current User  . Alcohol Use: Yes     Comment: OCCASIONAL    Review of Systems  Constitutional: Negative for fever and  chills.  HENT: Negative.   Eyes: Negative.   Respiratory: Negative for cough, chest tightness and shortness of breath.   Cardiovascular: Negative for chest pain.  Gastrointestinal: Negative for nausea and vomiting.  Genitourinary: Negative.   Musculoskeletal: Positive for arthralgias and myalgias.  Skin: Negative.   Neurological: Negative for weakness and numbness.    Allergies  Review of patient's allergies indicates no known allergies.  Home Medications   Current Outpatient Rx  Name  Route  Sig  Dispense  Refill  . albuterol (PROVENTIL) (2.5 MG/3ML) 0.083% nebulizer solution   Nebulization   Take 3 mLs (2.5 mg total) by nebulization every 6 (six) hours as needed for wheezing.   75 mL   11   . loratadine (CLARITIN) 10 MG tablet   Oral   Take 10 mg by mouth daily.         . Rivaroxaban (XARELTO) 20 MG TABS   Oral   Take 1 tablet (20 mg total) by mouth daily.   30 tablet   11   . HYDROcodone-acetaminophen (NORCO/VICODIN) 5-325 MG per tablet   Oral   Take 1 tablet by mouth every 6 (six) hours as needed for severe pain.   10 tablet   0    BP 121/84  Pulse 82  Temp(Src) 97 F (36.1 C) (Oral)  Resp 16  SpO2 99% Physical Exam  Constitutional: He is oriented to person, place, and  time. He appears well-developed and well-nourished. No distress.  HENT:  Head: Normocephalic and atraumatic.  Right Ear: External ear normal.  Left Ear: External ear normal.  Nose: Nose normal.  Mouth/Throat: Oropharynx is clear and moist.  Eyes: Conjunctivae are normal.  Neck: Normal range of motion. Neck supple.  Cardiovascular: Normal rate, regular rhythm, normal heart sounds and intact distal pulses.   Pulmonary/Chest: Effort normal and breath sounds normal. No respiratory distress. He exhibits no tenderness.  Abdominal: Soft.  Musculoskeletal: Normal range of motion.       Right shoulder: He exhibits tenderness and spasm. He exhibits normal range of motion, no bony tenderness, no  swelling, no effusion, no crepitus, no deformity, normal pulse and normal strength.       Left shoulder: Normal.       Right elbow: Normal.      Left elbow: Normal.       Right wrist: Normal.       Left wrist: Normal.       Cervical back: He exhibits tenderness. He exhibits normal range of motion, no bony tenderness, no swelling, no edema, no deformity, no laceration, no pain, no spasm and normal pulse.       Back:       Arms:      Right hand: Normal.       Left hand: Normal.  Negative Adson's maneuver. Negative Apprehension test. Negative Empty Can Test. ROM intact w/ Apley scratch test.   Neurological: He is alert and oriented to person, place, and time.  Skin: Skin is warm and dry. He is not diaphoretic.  Psychiatric: He has a normal mood and affect.    ED Course  Procedures (including critical care time) Labs Review Labs Reviewed - No data to display Imaging Review Dg Shoulder Right  05/30/2013   CLINICAL DATA:  Pain  EXAM: RIGHT SHOULDER - 2+ VIEW  COMPARISON:  None.  FINDINGS: Frontal, Y scapular, and axillary images were obtained. No fracture or dislocation. Joint spaces appear intact. No erosive change or intra-articular calcification.  IMPRESSION: No abnormality noted.   Electronically Signed   By: Bretta Bang M.D.   On: 05/30/2013 09:21    EKG Interpretation   None       MDM   1. Right shoulder pain     Afebrile, NAD, non-toxic appearing, AAOx4. No CP, SOB, diaphoresis, emesis. Pain not exertional. PE shows no instability, tenderness, or deformity of acromioclavicular and sternoclavicular joints, the cervical spine, glenohumeral joint, coracoid process, acromion, or scapula. Good shoulder strength during empty can test. Good ROM during scratch test. No signs of impingement on Adson's manuever. No shoulder instability during Apprehension test. Advised PCP f/u. Advised orthopedic f/u. Pain management and RICE method indicated and discussed. Return precautions  discussed. Patient is agreeable to plan. Patient is stable at time of discharge.         Jeannetta Ellis, PA-C 05/30/13 1224

## 2013-06-01 NOTE — ED Provider Notes (Signed)
Medical screening examination/treatment/procedure(s) were performed by non-physician practitioner and as supervising physician I was immediately available for consultation/collaboration.  EKG Interpretation   None         Audree Camel, MD 06/01/13 1505

## 2013-06-25 ENCOUNTER — Emergency Department (HOSPITAL_COMMUNITY)
Admission: EM | Admit: 2013-06-25 | Discharge: 2013-06-25 | Disposition: A | Discharge status: Home / Self Care | Payer: 59 | Attending: Emergency Medicine | Admitting: Emergency Medicine

## 2013-06-25 ENCOUNTER — Encounter (HOSPITAL_COMMUNITY): Payer: Self-pay | Admitting: Emergency Medicine

## 2013-06-25 ENCOUNTER — Emergency Department (HOSPITAL_COMMUNITY): Payer: 59

## 2013-06-25 DIAGNOSIS — M25522 Pain in left elbow: Secondary | ICD-10-CM

## 2013-06-25 DIAGNOSIS — Z79899 Other long term (current) drug therapy: Secondary | ICD-10-CM | POA: Diagnosis not present

## 2013-06-25 DIAGNOSIS — Y9389 Activity, other specified: Secondary | ICD-10-CM | POA: Diagnosis not present

## 2013-06-25 DIAGNOSIS — Z8719 Personal history of other diseases of the digestive system: Secondary | ICD-10-CM | POA: Diagnosis not present

## 2013-06-25 DIAGNOSIS — Z7901 Long term (current) use of anticoagulants: Secondary | ICD-10-CM | POA: Diagnosis not present

## 2013-06-25 DIAGNOSIS — Y9289 Other specified places as the place of occurrence of the external cause: Secondary | ICD-10-CM | POA: Diagnosis not present

## 2013-06-25 DIAGNOSIS — Y990 Civilian activity done for income or pay: Secondary | ICD-10-CM | POA: Insufficient documentation

## 2013-06-25 DIAGNOSIS — I209 Angina pectoris, unspecified: Secondary | ICD-10-CM | POA: Insufficient documentation

## 2013-06-25 DIAGNOSIS — S6990XA Unspecified injury of unspecified wrist, hand and finger(s), initial encounter: Secondary | ICD-10-CM | POA: Insufficient documentation

## 2013-06-25 DIAGNOSIS — J45909 Unspecified asthma, uncomplicated: Secondary | ICD-10-CM | POA: Diagnosis not present

## 2013-06-25 DIAGNOSIS — F17200 Nicotine dependence, unspecified, uncomplicated: Secondary | ICD-10-CM | POA: Diagnosis not present

## 2013-06-25 DIAGNOSIS — Z86718 Personal history of other venous thrombosis and embolism: Secondary | ICD-10-CM | POA: Diagnosis not present

## 2013-06-25 DIAGNOSIS — X500XXA Overexertion from strenuous movement or load, initial encounter: Secondary | ICD-10-CM | POA: Insufficient documentation

## 2013-06-25 DIAGNOSIS — S59909A Unspecified injury of unspecified elbow, initial encounter: Secondary | ICD-10-CM | POA: Diagnosis present

## 2013-06-25 DIAGNOSIS — Z8659 Personal history of other mental and behavioral disorders: Secondary | ICD-10-CM | POA: Insufficient documentation

## 2013-06-25 DIAGNOSIS — R011 Cardiac murmur, unspecified: Secondary | ICD-10-CM | POA: Diagnosis not present

## 2013-06-25 DIAGNOSIS — Z8619 Personal history of other infectious and parasitic diseases: Secondary | ICD-10-CM | POA: Insufficient documentation

## 2013-06-25 DIAGNOSIS — Z8701 Personal history of pneumonia (recurrent): Secondary | ICD-10-CM | POA: Insufficient documentation

## 2013-06-25 MED ORDER — HYDROCODONE-ACETAMINOPHEN 5-325 MG PO TABS: 1.0000 | ORAL_TABLET | ORAL | Status: DC | PRN

## 2013-06-25 MED ORDER — HYDROCODONE-ACETAMINOPHEN 5-325 MG PO TABS
1.0000 | ORAL_TABLET | Freq: Once | ORAL | Status: AC
  Administered 2013-06-25: 1 via ORAL
  Filled 2013-06-25: qty 1

## 2013-06-25 NOTE — ED Notes (Signed)
Patient transported to X-ray 

## 2013-06-25 NOTE — ED Provider Notes (Signed)
Medical screening examination/treatment/procedure(s) were performed by non-physician practitioner and as supervising physician I was immediately available for consultation/collaboration.  William Schake L Gopal Malter, MD 06/25/13 1637 

## 2013-06-25 NOTE — ED Provider Notes (Signed)
CSN: [id number]     Arrival date & time 06/25/13  1141 History  This chart was scribed for non-physician practitioner, Trixie Dredge, PA-C working with Flint Melter, MD by Greggory Stallion, ED scribe. This patient was seen in room TR08C/TR08C and the patient's care was started at 12:39 PM.   Chief Complaint  Patient presents with  . Arm Pain   The history is provided by the patient. No language interpreter was used.   HPI Comments: [name] [name] is a 47 y.o. male who presents to the Emergency Department complaining of left elbow injury that occurred earlier today. He states he was moving racks at work, twisted his elbow and felt a [name]. Pt has sudden onset left elbow pain. Rates the pain 7/10. Denies neck pain, back pain, weakness, numbness. Pt is right hand dominant.   Past Medical History  Diagnosis Date  . Asthma     hospitalized in past, questionable hx of bronchitis dx  . DVT (deep venous thrombosis)     times 2, on coumadin chronically  . Depression     hx of SI  . Substance abuse     crack, cocaine, last use 2007  . Tobacco abuse   . GERD (gastroesophageal reflux disease)   . DVT (deep venous thrombosis)   . Bronchitis   . Heart murmur   . Shortness of breath   . Tuberculosis     ' I test Positive "  . Anginal pain   . Pneumonia    Past Surgical History  Procedure Laterality Date  . Skin graft     History reviewed. No pertinent family history. History  Substance Use Topics  . Smoking status: Current Every Day Smoker -- 0.10 packs/day for 23 years    Types: Cigarettes  . Smokeless tobacco: Current User  . Alcohol Use: Yes     Comment: OCCASIONAL    Review of Systems  Musculoskeletal: Positive for arthralgias. Negative for back pain and neck pain.  Neurological: Negative for weakness and numbness.  All other systems reviewed and are negative.    Allergies  Review of patient's allergies indicates no known allergies.  Home Medications   Current Outpatient Rx   Name  Route  Sig  Dispense  Refill  . albuterol (PROVENTIL) (2.5 MG/3ML) 0.083% nebulizer solution   Nebulization   Take 3 mLs (2.5 mg total) by nebulization every 6 (six) hours as needed for wheezing.   75 mL   11   . HYDROcodone-acetaminophen (NORCO/VICODIN) 5-325 MG per tablet   Oral   Take 1 tablet by mouth every 6 (six) hours as needed for severe pain.   10 tablet   0   . loratadine (CLARITIN) 10 MG tablet   Oral   Take 10 mg by mouth daily.         . Rivaroxaban (XARELTO) 20 MG TABS   Oral   Take 1 tablet (20 mg total) by mouth daily.   30 tablet   11    BP 112/77  Pulse 71  Temp(Src) 98 F (36.7 C) (Oral)  Resp 12  SpO2 98%  Physical Exam  Nursing note and vitals reviewed. Constitutional: He appears well-developed and well-nourished. No distress.  HENT:  Head: Normocephalic and atraumatic.  Neck: Neck supple.  Pulmonary/Chest: Effort normal.  Musculoskeletal:  No bony tenderness on elbow posteriorly. Tenderness on proximal forearm bilaterally. Left wrist is non tender. Left shoulder is tender anteriorly but without bony tenderness. No AC joint tenderness. Distal  sensation and pulses intact.   Neurological: He is alert.  Skin: He is not diaphoretic.    ED Course  Procedures (including critical care time)  DIAGNOSTIC STUDIES: Oxygen Saturation is 98% on RA, normal by my interpretation.    COORDINATION OF CARE: 12:41 PM-Discussed treatment plan which includes xray and pain medication with pt at bedside and pt agreed to plan.   Labs Review Labs Reviewed - No data to display Imaging Review Dg Elbow Complete Left  06/25/2013   CLINICAL DATA:  Traumatic injury with pain  EXAM: LEFT ELBOW - COMPLETE 3+ VIEW  COMPARISON:  None.  FINDINGS: There is no evidence of fracture, dislocation, or joint effusion. There is no evidence of arthropathy or other focal bone abnormality. Soft tissues are unremarkable.  IMPRESSION: No acute abnormality noted.    Electronically Signed   By: Alcide Clever M.D.   On: 06/25/2013 13:03    EKG Interpretation   None       MDM   1. Left elbow pain    Left elbow pain after injury at work.  He was pulling racks and felt a [name].  No bony tenderness ,xray negative.  Neurovascularly intact.  D/C home with norco and ortho follow up.  Discussed result, findings, treatment, and follow up  with patient.  Pt given return precautions.  Pt verbalizes understanding and agrees with plan.      I personally performed the services described in this documentation, which was scribed in my presence. The recorded information has been reviewed and is accurate.   Trixie Dredge, PA-C 06/25/13 1524

## 2013-06-25 NOTE — ED Notes (Signed)
Pt c/o left elbow pain after moving racks at work today and felt "pop" in elbow

## 2013-06-26 ENCOUNTER — Encounter (HOSPITAL_COMMUNITY): Payer: Self-pay | Admitting: Emergency Medicine

## 2013-06-26 ENCOUNTER — Emergency Department (INDEPENDENT_AMBULATORY_CARE_PROVIDER_SITE_OTHER)
Admission: EM | Admit: 2013-06-26 | Discharge: 2013-06-26 | Disposition: A | Discharge status: Home / Self Care | Payer: Worker's Compensation | Source: Home / Self Care | Attending: Family Medicine | Admitting: Family Medicine

## 2013-06-26 DIAGNOSIS — S59902D Unspecified injury of left elbow, subsequent encounter: Secondary | ICD-10-CM

## 2013-06-26 DIAGNOSIS — M25529 Pain in unspecified elbow: Secondary | ICD-10-CM

## 2013-06-26 NOTE — ED Provider Notes (Signed)
CSN: [id number]     Arrival date & time 06/26/13  1609 History   First MD Initiated Contact with Patient 06/26/13 1722     Chief Complaint  Patient presents with  . Follow-up    left elbow inj   (Consider location/radiation/quality/duration/timing/severity/associated sxs/prior Treatment) HPI Comments: Was seen for same at [facility] ER on 06/25/2013 (day of injury). Records reviewed and radiographs of injured area all normal. Patient was advised regarding pain management, sling use and referred to orthopedics for follow up. He comes to the UC tonight stating that his insurance provider told him that he needed to be seen at an urgent care to begin the workman's compensation claim.   Patient is a 47 y.o. male presenting with arm injury.  Arm Injury Location:  Elbow Time since incident:  1 day Injury: yes   Mechanism of injury comment:  Reports injury to left elbow area when attempting to move a rack at work.  Elbow location:  L elbow Pain details:    Quality:  Sharp   Severity:  Moderate   Duration:  1 day Dislocation: no   Foreign body present:  No foreign bodies   Past Medical History  Diagnosis Date  . Asthma     hospitalized in past, questionable hx of bronchitis dx  . DVT (deep venous thrombosis)     times 2, on coumadin chronically  . Depression     hx of SI  . Substance abuse     crack, cocaine, last use 2007  . Tobacco abuse   . GERD (gastroesophageal reflux disease)   . DVT (deep venous thrombosis)   . Bronchitis   . Heart murmur   . Shortness of breath   . Tuberculosis     ' I test Positive "  . Anginal pain   . Pneumonia    Past Surgical History  Procedure Laterality Date  . Skin graft     No family history on file. History  Substance Use Topics  . Smoking status: Current Every Day Smoker -- 0.10 packs/day for 23 years    Types: Cigarettes  . Smokeless tobacco: Current User  . Alcohol Use: Yes     Comment: OCCASIONAL    Review of Systems  All  other systems reviewed and are negative.    Allergies  Review of patient's allergies indicates no known allergies.  Home Medications   Current Outpatient Rx  Name  Route  Sig  Dispense  Refill  . albuterol (PROVENTIL) (2.5 MG/3ML) 0.083% nebulizer solution   Nebulization   Take 3 mLs (2.5 mg total) by nebulization every 6 (six) hours as needed for wheezing.   75 mL   11   . HYDROcodone-acetaminophen (NORCO/VICODIN) 5-325 MG per tablet   Oral   Take 1 tablet by mouth every 4 (four) hours as needed.   15 tablet   0   . Rivaroxaban (XARELTO) 20 MG TABS   Oral   Take 1 tablet (20 mg total) by mouth daily.   30 tablet   11    There were no vitals taken for this visit. Physical Exam  Nursing note reviewed. Constitutional: He is oriented to person, place, and time. He appears well-developed and well-nourished. No distress.  HENT:  Head: Normocephalic and atraumatic.  Cardiovascular: Normal rate.   Pulmonary/Chest: Effort normal.  Musculoskeletal:       Left elbow: He exhibits decreased range of motion. He exhibits no swelling, no effusion, no deformity and no laceration. Tenderness  found.       Arms: ROM at left elbow only limited by discomfort  Neurological: He is alert and oriented to person, place, and time.  Skin: Skin is warm and dry. No rash noted. No erythema.  +intact  Psychiatric: He has a normal mood and affect. His behavior is normal.    ED Course  Procedures (including critical care time) Labs Review Labs Reviewed - No data to display Imaging Review Dg Elbow Complete Left  06/25/2013   CLINICAL DATA:  Traumatic injury with pain  EXAM: LEFT ELBOW - COMPLETE 3+ VIEW  COMPARISON:  None.  FINDINGS: There is no evidence of fracture, dislocation, or joint effusion. There is no evidence of arthropathy or other focal bone abnormality. Soft tissues are unremarkable.  IMPRESSION: No acute abnormality noted.   Electronically Signed   By: Alcide Clever M.D.   On:  06/25/2013 13:03    EKG Interpretation    Date/Time:    Ventricular Rate:    PR Interval:    QRS Duration:   QT Interval:    QTC Calculation:   R Axis:     Text Interpretation:              MDM  Advised patient regarding use of sling, pain medication and RICE therapy. Referred to [facility] for follow up and management of workman's compensation claim.   Jess Barters [place], [place] 06/26/13 1947  Jess Barters [place], [place] 06/26/13 [phone]

## 2013-06-26 NOTE — ED Notes (Signed)
Pt is here for a f/u for left elbow inj... Seen at [facility] ED but workers comp want pt to come through [facility] first??? Given hydrocodone 5/325... inj elbow while moving racks at work.  He is alert and voices no new concerns w/no signs of acute distress.

## 2013-06-26 NOTE — ED Provider Notes (Signed)
Medical screening examination/treatment/procedure(s) were performed by resident physician or non-physician practitioner and as supervising physician I was immediately available for consultation/collaboration.   Barkley Bruns MD.   Linna Hoff, MD 06/26/13 2052

## 2013-07-18 ENCOUNTER — Encounter (HOSPITAL_COMMUNITY): Payer: Self-pay | Admitting: Respiratory Therapy

## 2013-07-18 NOTE — Pre-Procedure Instructions (Signed)
BOLDEN HAGERMAN  07/18/2013   Your procedure is scheduled on:  Wed, Jan 14 @ 2:00 PM  Report to [facility] Short Stay Entrance A  at 12:00 PM.  Call this number if you have problems the morning of surgery: [phone]   Remember:   Do not eat food or drink liquids after midnight.   Take these medicines the morning of surgery with A SIP OF WATER: Albuterol<Bring Your Inhaler With You> and Pain Pill(if needed)              Stop taking your Xarelto. No Goody's,BC's,Aleve,Aspirin,Ibuprofen,Fish Oil,or any Herbal Medications   Do not wear jewelry  Do not wear lotions, powders, or colognes. You may wear deodorant.  Men may shave face and neck.  Do not bring valuables to the hospital.  [facility] is not responsible                  for any belongings or valuables.               Contacts, dentures or bridgework may not be worn into surgery.  Leave suitcase in the car. After surgery it may be brought to your room.  For patients admitted to the hospital, discharge time is determined by your                treatment team.               Patients discharged the day of surgery will not be allowed to drive  home.    Special Instructions: Shower using CHG 2 nights before surgery and the night before surgery.  If you shower the day of surgery use CHG.  Use special wash - you have one bottle of CHG for all showers.  You should use approximately 1/3 of the bottle for each shower.   Please read over the following fact sheets that you were given: Pain Booklet, Coughing and Deep Breathing and Surgical Site Infection Prevention

## 2013-07-21 ENCOUNTER — Encounter (HOSPITAL_COMMUNITY)
Admission: RE | Admit: 2013-07-21 | Discharge: 2013-07-21 | Disposition: A | Discharge status: Home / Self Care | Payer: 59 | Source: Ambulatory Visit | Attending: Orthopedic Surgery | Admitting: Orthopedic Surgery

## 2013-07-21 ENCOUNTER — Encounter (HOSPITAL_COMMUNITY): Payer: Self-pay

## 2013-07-21 HISTORY — DX: Cough, unspecified: R05.9

## 2013-07-21 HISTORY — DX: Cough: R05

## 2013-07-21 HISTORY — DX: Unspecified osteoarthritis, unspecified site: M19.90

## 2013-07-21 HISTORY — DX: Pain in unspecified joint: M25.50

## 2013-07-21 LAB — CBC
HCT: 48.9 % (ref 39.0–52.0)
Hemoglobin: 16.4 g/dL (ref 13.0–17.0)
MCH: 31.7 pg (ref 26.0–34.0)
MCHC: 33.5 g/dL (ref 30.0–36.0)
MCV: 94.6 fL (ref 78.0–100.0)
PLATELETS: 176 10*3/uL (ref 150–400)
RBC: 5.17 MIL/uL (ref 4.22–5.81)
RDW: 13.5 % (ref 11.5–15.5)
WBC: 8.6 10*3/uL (ref 4.0–10.5)

## 2013-07-21 LAB — BASIC METABOLIC PANEL
BUN: 14 mg/dL (ref 6–23)
CO2: 26 mEq/L (ref 19–32)
Calcium: 8.6 mg/dL (ref 8.4–10.5)
Chloride: 105 mEq/L (ref 96–112)
Creatinine, Ser: 1.23 mg/dL (ref 0.50–1.35)
GFR, EST AFRICAN AMERICAN: 79 mL/min — AB (ref >90)
GFR, EST NON AFRICAN AMERICAN: 68 mL/min — AB (ref >90)
Glucose, Bld: 94 mg/dL (ref 70–99)
POTASSIUM: 4 meq/L (ref 3.7–5.3)
Sodium: 142 mEq/L (ref 137–147)

## 2013-07-21 NOTE — Progress Notes (Addendum)
Pt doesn't have a cardiologist  Denies ever having an echo/stress test/heart cath  [facility]     EKG and CXR in epic from 12-27-12

## 2013-07-22 ENCOUNTER — Other Ambulatory Visit: Payer: Self-pay | Admitting: Internal Medicine

## 2013-07-22 ENCOUNTER — Telehealth: Payer: Self-pay | Admitting: Internal Medicine

## 2013-07-22 MED ORDER — ALBUTEROL SULFATE (2.5 MG/3ML) 0.083% IN NEBU: 2.5000 mg | INHALATION_SOLUTION | Freq: Four times a day (QID) | RESPIRATORY_TRACT | Status: DC | PRN

## 2013-07-22 NOTE — Telephone Encounter (Signed)
Needs Rx refill Albuterol inhaler.  Refilled.   Aundra Dubin MD

## 2013-07-23 ENCOUNTER — Encounter (HOSPITAL_COMMUNITY)
Admission: RE | Disposition: A | Discharge status: Home / Self Care | Payer: Self-pay | Source: Ambulatory Visit | Attending: Orthopedic Surgery

## 2013-07-23 ENCOUNTER — Ambulatory Visit (HOSPITAL_COMMUNITY)
Admission: RE | Admit: 2013-07-23 | Discharge: 2013-07-23 | Disposition: A | Discharge status: Home / Self Care | Payer: 59 | Source: Ambulatory Visit | Attending: Orthopedic Surgery | Admitting: Orthopedic Surgery

## 2013-07-23 ENCOUNTER — Encounter (HOSPITAL_COMMUNITY): Payer: Self-pay | Admitting: *Deleted

## 2013-07-23 ENCOUNTER — Ambulatory Visit (HOSPITAL_COMMUNITY): Payer: 59 | Admitting: Anesthesiology

## 2013-07-23 ENCOUNTER — Encounter (HOSPITAL_COMMUNITY): Payer: 59 | Admitting: Anesthesiology

## 2013-07-23 DIAGNOSIS — R011 Cardiac murmur, unspecified: Secondary | ICD-10-CM | POA: Insufficient documentation

## 2013-07-23 DIAGNOSIS — S43499A Other sprain of unspecified shoulder joint, initial encounter: Secondary | ICD-10-CM | POA: Insufficient documentation

## 2013-07-23 DIAGNOSIS — S46819A Strain of other muscles, fascia and tendons at shoulder and upper arm level, unspecified arm, initial encounter: Principal | ICD-10-CM

## 2013-07-23 DIAGNOSIS — X58XXXA Exposure to other specified factors, initial encounter: Secondary | ICD-10-CM | POA: Insufficient documentation

## 2013-07-23 DIAGNOSIS — Z7901 Long term (current) use of anticoagulants: Secondary | ICD-10-CM | POA: Insufficient documentation

## 2013-07-23 DIAGNOSIS — F17200 Nicotine dependence, unspecified, uncomplicated: Secondary | ICD-10-CM | POA: Insufficient documentation

## 2013-07-23 DIAGNOSIS — J45909 Unspecified asthma, uncomplicated: Secondary | ICD-10-CM | POA: Insufficient documentation

## 2013-07-23 DIAGNOSIS — K219 Gastro-esophageal reflux disease without esophagitis: Secondary | ICD-10-CM | POA: Insufficient documentation

## 2013-07-23 DIAGNOSIS — Z86718 Personal history of other venous thrombosis and embolism: Secondary | ICD-10-CM | POA: Insufficient documentation

## 2013-07-23 HISTORY — PX: DISTAL BICEPS TENDON REPAIR: SHX1461

## 2013-07-23 SURGERY — REPAIR, TENDON, BICEPS, DISTAL
Anesthesia: General | Site: Arm Upper | Laterality: Left

## 2013-07-23 MED ORDER — LACTATED RINGERS IV SOLN
INTRAVENOUS | Status: DC
  Administered 2013-07-23: 12:00:00 via INTRAVENOUS

## 2013-07-23 MED ORDER — FENTANYL CITRATE 0.05 MG/ML IJ SOLN
INTRAMUSCULAR | Status: AC
  Administered 2013-07-23: 100 ug
  Filled 2013-07-23: qty 2

## 2013-07-23 MED ORDER — CEFAZOLIN SODIUM-DEXTROSE 2-3 GM-% IV SOLR: 2.0000 g | INTRAVENOUS | Status: DC

## 2013-07-23 MED ORDER — LACTATED RINGERS IV SOLN
INTRAVENOUS | Status: DC | PRN
  Administered 2013-07-23 (×3): via INTRAVENOUS

## 2013-07-23 MED ORDER — FENTANYL CITRATE 0.05 MG/ML IJ SOLN
INTRAMUSCULAR | Status: DC | PRN
  Administered 2013-07-23: 100 ug via INTRAVENOUS

## 2013-07-23 MED ORDER — DEXAMETHASONE SODIUM PHOSPHATE 10 MG/ML IJ SOLN
INTRAMUSCULAR | Status: DC | PRN
  Administered 2013-07-23: 6 mg

## 2013-07-23 MED ORDER — METHOCARBAMOL 500 MG PO TABS: 500.0000 mg | ORAL_TABLET | Freq: Three times a day (TID) | ORAL | Status: DC | PRN

## 2013-07-23 MED ORDER — MIDAZOLAM HCL 2 MG/2ML IJ SOLN
INTRAMUSCULAR | Status: AC
  Administered 2013-07-23: 2 mg
  Filled 2013-07-23: qty 2

## 2013-07-23 MED ORDER — ARTIFICIAL TEARS OP OINT
TOPICAL_OINTMENT | OPHTHALMIC | Status: DC | PRN
  Administered 2013-07-23: 1 via OPHTHALMIC

## 2013-07-23 MED ORDER — EPHEDRINE SULFATE 50 MG/ML IJ SOLN
INTRAMUSCULAR | Status: DC | PRN
  Administered 2013-07-23 (×3): 10 mg via INTRAVENOUS

## 2013-07-23 MED ORDER — CHLORHEXIDINE GLUCONATE 4 % EX LIQD: 60.0000 mL | Freq: Once | CUTANEOUS | Status: DC

## 2013-07-23 MED ORDER — OXYCODONE HCL 5 MG/5ML PO SOLN: 5.0000 mg | Freq: Once | ORAL | Status: DC | PRN

## 2013-07-23 MED ORDER — GLYCOPYRROLATE 0.2 MG/ML IJ SOLN
INTRAMUSCULAR | Status: DC | PRN
  Administered 2013-07-23 (×2): 0.2 mg via INTRAVENOUS

## 2013-07-23 MED ORDER — PROMETHAZINE HCL 25 MG/ML IJ SOLN: 6.2500 mg | INTRAMUSCULAR | Status: DC | PRN

## 2013-07-23 MED ORDER — HYDROMORPHONE HCL PF 1 MG/ML IJ SOLN: 0.2500 mg | INTRAMUSCULAR | Status: DC | PRN

## 2013-07-23 MED ORDER — OXYCODONE HCL 5 MG PO TABS: 5.0000 mg | ORAL_TABLET | Freq: Once | ORAL | Status: DC | PRN

## 2013-07-23 MED ORDER — BUPIVACAINE-EPINEPHRINE 0.5% -1:200000 IJ SOLN
INTRAMUSCULAR | Status: DC | PRN
  Administered 2013-07-23: 30 mL

## 2013-07-23 MED ORDER — CHLORHEXIDINE GLUCONATE CLOTH 2 % EX PADS: 6.0000 | MEDICATED_PAD | Freq: Once | CUTANEOUS | Status: DC

## 2013-07-23 MED ORDER — BUPIVACAINE-EPINEPHRINE PF 0.25-1:200000 % IJ SOLN
INTRAMUSCULAR | Status: AC
  Filled 2013-07-23: qty 30

## 2013-07-23 MED ORDER — CEFAZOLIN SODIUM-DEXTROSE 2-3 GM-% IV SOLR
INTRAVENOUS | Status: AC
  Administered 2013-07-23: 2 g via INTRAVENOUS
  Filled 2013-07-23: qty 50

## 2013-07-23 MED ORDER — PROPOFOL 10 MG/ML IV BOLUS
INTRAVENOUS | Status: DC | PRN
  Administered 2013-07-23: 200 mg via INTRAVENOUS

## 2013-07-23 MED ORDER — OXYCODONE-ACETAMINOPHEN 5-325 MG PO TABS: 1.0000 | ORAL_TABLET | ORAL | Status: DC | PRN

## 2013-07-23 MED ORDER — BUPIVACAINE-EPINEPHRINE PF 0.5-1:200000 % IJ SOLN
INTRAMUSCULAR | Status: DC | PRN
  Administered 2013-07-23: 150 mg via PERINEURAL

## 2013-07-23 MED ORDER — 0.9 % SODIUM CHLORIDE (POUR BTL) OPTIME
TOPICAL | Status: DC | PRN
  Administered 2013-07-23: 1000 mL

## 2013-07-23 SURGICAL SUPPLY — 68 items
BANDAGE ELASTIC 4 VELCRO ST LF (GAUZE/BANDAGES/DRESSINGS) ×6 IMPLANT
BIT DRILL CANN ENDO 4.5 STRL (BIT) ×3 IMPLANT
BLADE AVERAGE 25MMX9MM (BLADE) ×1
BLADE AVERAGE 25X9 (BLADE) ×2 IMPLANT
BNDG GAUZE ELAST 4 BULKY (GAUZE/BANDAGES/DRESSINGS) ×3 IMPLANT
BUR EGG ELITE 4.0 (BURR) ×2 IMPLANT
BUR EGG ELITE 4.0MM (BURR) ×1
CLEANER TIP ELECTROSURG 2X2 (MISCELLANEOUS) IMPLANT
CLIP TI MEDIUM 6 (CLIP) ×9 IMPLANT
CLOSURE WOUND 1/2 X4 (GAUZE/BANDAGES/DRESSINGS)
CORDS BIPOLAR (ELECTRODE) ×3 IMPLANT
COVER SURGICAL LIGHT HANDLE (MISCELLANEOUS) ×3 IMPLANT
DRAPE INCISE IOBAN 66X45 STRL (DRAPES) IMPLANT
DRAPE OEC MINIVIEW 54X84 (DRAPES) ×3 IMPLANT
DRAPE ORTHO SPLIT 77X108 STRL (DRAPES)
DRAPE PROXIMA HALF (DRAPES) ×3 IMPLANT
DRAPE STERI 35X30 U-POUCH (DRAPES) IMPLANT
DRAPE SURG ORHT 6 SPLT 77X108 (DRAPES) IMPLANT
DRAPE U-SHAPE 47X51 STRL (DRAPES) IMPLANT
DRSG ADAPTIC 3X8 NADH LF (GAUZE/BANDAGES/DRESSINGS) ×3 IMPLANT
DRSG EMULSION OIL 3X3 NADH (GAUZE/BANDAGES/DRESSINGS) IMPLANT
DRSG PAD ABDOMINAL 8X10 ST (GAUZE/BANDAGES/DRESSINGS) IMPLANT
DURAPREP 26ML APPLICATOR (WOUND CARE) ×3 IMPLANT
ELECT NEEDLE TIP 2.8 STRL (NEEDLE) IMPLANT
ELECT REM PT RETURN 9FT ADLT (ELECTROSURGICAL) ×3
ELECTRODE REM PT RTRN 9FT ADLT (ELECTROSURGICAL) ×1 IMPLANT
ENDOBUTTON CL ULTRA 25MM ×3 IMPLANT
FIXATION ENDBTTN CL ULTR 25MM ×1 IMPLANT
GLOVE BIO SURGEON STRL SZ7.5 (GLOVE) ×3 IMPLANT
GLOVE BIO SURGEON STRL SZ8 (GLOVE) ×3 IMPLANT
GLOVE BIOGEL PI IND STRL 6.5 (GLOVE) ×1 IMPLANT
GLOVE BIOGEL PI IND STRL 8.5 (GLOVE) ×1 IMPLANT
GLOVE BIOGEL PI INDICATOR 6.5 (GLOVE) ×2
GLOVE BIOGEL PI INDICATOR 8.5 (GLOVE) ×2
GLOVE BIOGEL PI ORTHO PRO 7.5 (GLOVE) ×4
GLOVE BIOGEL PI ORTHO PRO SZ8 (GLOVE) ×4
GLOVE ORTHO TXT STRL SZ7.5 (GLOVE) ×3 IMPLANT
GLOVE PI ORTHO PRO STRL 7.5 (GLOVE) ×2 IMPLANT
GLOVE PI ORTHO PRO STRL SZ8 (GLOVE) ×2 IMPLANT
GLOVE SS BIOGEL STRL SZ 7 (GLOVE) ×1 IMPLANT
GLOVE SUPERSENSE BIOGEL SZ 7 (GLOVE) ×2
GLOVE SURG ORTHO 8.0 STRL STRW (GLOVE) ×3 IMPLANT
GLOVE SURG ORTHO 8.5 STRL (GLOVE) ×3 IMPLANT
GLOVE SURG SS PI 6.5 STRL IVOR (GLOVE) ×3 IMPLANT
GOWN STRL NON-REIN LRG LVL3 (GOWN DISPOSABLE) ×3 IMPLANT
GOWN STRL REIN XL XLG (GOWN DISPOSABLE) ×6 IMPLANT
KIT BASIN OR (CUSTOM PROCEDURE TRAY) ×3 IMPLANT
KIT ROOM TURNOVER OR (KITS) ×3 IMPLANT
NDL SUT .5 MAYO 1.404X.05X (NEEDLE) IMPLANT
NDL SUT 6 .5 CRC .975X.05 MAYO (NEEDLE) IMPLANT
NEEDLE HYPO 25GX1X1/2 BEV (NEEDLE) ×3 IMPLANT
NEEDLE MAYO TAPER (NEEDLE)
NS IRRIG 1000ML POUR BTL (IV SOLUTION) ×3 IMPLANT
PACK ORTHO EXTREMITY (CUSTOM PROCEDURE TRAY) ×3 IMPLANT
PAD ARMBOARD 7.5X6 YLW CONV (MISCELLANEOUS) ×6 IMPLANT
SPLINT FIBERGLASS 4X30 (CAST SUPPLIES) ×6 IMPLANT
SPONGE GAUZE 4X4 12PLY (GAUZE/BANDAGES/DRESSINGS) IMPLANT
SPONGE GAUZE 4X4 12PLY STER LF (GAUZE/BANDAGES/DRESSINGS) ×3 IMPLANT
SPONGE LAP 4X18 X RAY DECT (DISPOSABLE) ×3 IMPLANT
STENT POLYFLEX AIRWAY 18X80MM (STENTS) ×3 IMPLANT
STRIP CLOSURE SKIN 1/2X4 (GAUZE/BANDAGES/DRESSINGS) IMPLANT
SUT MNCRL AB 3-0 PS2 18 (SUTURE) IMPLANT
SUT VIC AB 2-0 CT1 27 (SUTURE)
SUT VIC AB 2-0 CT1 TAPERPNT 27 (SUTURE) IMPLANT
SYR CONTROL 10ML LL (SYRINGE) ×3 IMPLANT
TOWEL OR 17X24 6PK STRL BLUE (TOWEL DISPOSABLE) ×3 IMPLANT
TOWEL OR 17X26 10 PK STRL BLUE (TOWEL DISPOSABLE) ×3 IMPLANT
TUBE SUCT ARGYLE STRL (TUBING) ×3 IMPLANT

## 2013-07-23 NOTE — Transfer of Care (Signed)
Immediate Anesthesia Transfer of Care Note  Patient: [name] [name]  Procedure(s) Performed: Procedure(s): LEFT DISTAL BICEPS TENDON REPAIR (Left)  Patient Location: PACU  Anesthesia Type:GA combined with regional for post-op pain  Level of Consciousness: awake, alert  and oriented  Airway & Oxygen Therapy: Patient Spontanous Breathing and Patient connected to nasal cannula oxygen  Post-op Assessment: Report given to PACU RN and Post -op Vital signs reviewed and stable  Post vital signs: Reviewed and stable  Complications: No apparent anesthesia complications

## 2013-07-23 NOTE — Interval H&P Note (Signed)
History and Physical Interval Note:  07/23/2013 4:50 PM  [name] [name]  has presented today for surgery, with the diagnosis of LEFT DISTAL BICEP RUPTURE  The various methods of treatment have been discussed with the patient and family. After consideration of risks, benefits and other options for treatment, the patient has consented to  Procedure(s): LEFT DISTAL BICEPS TENDON REPAIR (Left) as a surgical intervention .  The patient's history has been reviewed, patient examined, no change in status, stable for surgery.  I have reviewed the patient's chart and labs.  Questions were answered to the patient's satisfaction.     Camisha Srey,STEVEN R

## 2013-07-23 NOTE — Brief Op Note (Signed)
07/23/2013  4:50 PM  PATIENT:  [name] [name]  48 y.o. male  PRE-OPERATIVE DIAGNOSIS:  LEFT DISTAL BICEP RUPTURE  POST-OPERATIVE DIAGNOSIS:  LEFT DISTAL BICEP RUPTURE  PROCEDURE:  Procedure(s): LEFT DISTAL BICEPS TENDON REPAIR (Left) Endo Button technique  SURGEON:  Surgeon(s) and Role:    * Augustin Schooling, MD - Primary    * Linna Hoff, MD - Assisting  PHYSICIAN ASSISTANT:   ASSISTANTS: Iran Planas MD   ANESTHESIA:   Regional and General  EBL:  Total I/O In: 2000 [I.V.:2000] Out: -   BLOOD ADMINISTERED:none  DRAINS: none   LOCAL MEDICATIONS USED:  MARCAINE     SPECIMEN:  No Specimen  DISPOSITION OF SPECIMEN:  N/A  COUNTS:  YES  TOURNIQUET:  * No tourniquets in log *  DICTATION: .Other Dictation: Dictation Number [id number]  PLAN OF CARE: Discharge to home after PACU  PATIENT DISPOSITION:  PACU - hemodynamically stable.   Delay start of Pharmacological VTE agent (>24hrs) due to surgical blood loss or risk of bleeding: not applicable

## 2013-07-23 NOTE — Anesthesia Preprocedure Evaluation (Signed)
Anesthesia Evaluation    Reviewed: Allergy & Precautions, H&P , NPO status , Patient's Chart, lab work & pertinent test results  History of Anesthesia Complications Negative for: history of anesthetic complications  Airway       Dental   Pulmonary asthma , Current Smoker,          Cardiovascular + Peripheral Vascular Disease     Neuro/Psych PSYCHIATRIC DISORDERS Depression negative neurological ROS     GI/Hepatic GERD-  Medicated,(+)     substance abuse  cocaine use,   Endo/Other  negative endocrine ROS  Renal/GU negative Renal ROS     Musculoskeletal   Abdominal   Peds  Hematology   Anesthesia Other Findings   Reproductive/Obstetrics                           Anesthesia Physical Anesthesia Plan  ASA: III  Anesthesia Plan: General   Post-op Pain Management:    Induction:   Airway Management Planned: Oral ETT  Additional Equipment:   Intra-op Plan:   Post-operative Plan:   Informed Consent: I have reviewed the patients History and Physical, chart, labs and discussed the procedure including the risks, benefits and alternatives for the proposed anesthesia with the patient or authorized representative who has indicated his/her understanding and acceptance.   Dental advisory given  Plan Discussed with: CRNA, Anesthesiologist and Surgeon  Anesthesia Plan Comments:         Anesthesia Quick Evaluation

## 2013-07-23 NOTE — Preoperative (Signed)
Beta Blockers   Reason not to administer Beta Blockers:Not Applicable 

## 2013-07-23 NOTE — Transfer of Care (Signed)
Immediate Anesthesia Transfer of Care Note  Patient: [name] [name]  Procedure(s) Performed: Procedure(s): LEFT DISTAL BICEPS TENDON REPAIR (Left)  Patient Location: PACU  Anesthesia Type:GA combined with regional for post-op pain  Level of Consciousness: awake, alert  and oriented  Airway & Oxygen Therapy: Patient Spontanous Breathing and Patient connected to nasal cannula oxygen  Post-op Assessment: Report given to PACU RN  Post vital signs: Reviewed and stable  Complications: No apparent anesthesia complications

## 2013-07-23 NOTE — H&P (Signed)
  [name] [name] is an 48 y.o. male.    Chief Complaint: left elbow and arm pain  HPI: Pt is a 48 y.o. male complaining of left elbow and arm pain for several weeks. Pain had continually increased since the beginning. Mri and exam suggest distal biceps rupture to left elbow. Various options are discussed with the patient. Risks, benefits and expectations were discussed with the patient. Patient understand the risks, benefits and expectations and wishes to proceed with surgery.   PCP:  No PCP Per Patient  D/C Plans:  Home   PMH: Past Medical History  Diagnosis Date  . DVT (deep venous thrombosis)     takes Xarelto daily--- in right leg x 2 and lung x 1  . Substance abuse     crack, cocaine, last use 2007  . Tobacco abuse   . DVT (deep venous thrombosis)   . Bronchitis     last time 2 yrs ago  . Heart murmur   . Shortness of breath   . Tuberculosis     ' I test Positive "  . Asthma     uses Albuterol daily as needed  . Cough     smokers  . Arthritis     knees  . Joint pain   . GERD (gastroesophageal reflux disease)     only takes something about 2 times a yr    PSH: Past Surgical History  Procedure Laterality Date  . Skin graft  1971    Social History:  reports that he has been smoking Cigarettes.  He has a 6.25 pack-year smoking history. He uses smokeless tobacco. He reports that he drinks alcohol. He reports that he uses illicit drugs (Cocaine).  Allergies:  No Known Allergies  Medications: No current facility-administered medications for this encounter.   Current Outpatient Prescriptions  Medication Sig Dispense Refill  . HYDROcodone-acetaminophen (NORCO/VICODIN) 5-325 MG per tablet Take 1 tablet by mouth every 4 (four) hours as needed.  15 tablet  0  . Rivaroxaban (XARELTO) 20 MG TABS Take 1 tablet (20 mg total) by mouth daily.  30 tablet  11  . albuterol (PROVENTIL) (2.5 MG/3ML) 0.083% nebulizer solution Take 3 mLs (2.5 mg total) by nebulization every 6 (six)  hours as needed for wheezing.  75 mL  11    No results found for this or any previous visit (from the past 48 hour(s)). No results found.  ROS: Pain with rom of the left upper extremity  Physical Exam:  Alert and oriented 48 y.o. male in no acute distress Cranial nerves 2-12 intact Cervical spine: full rom with no tenderness, nv intact distally Chest: active breath sounds bilaterally, no wheeze rhonchi or rales Heart: regular rate and rhythm, no murmur Abd: non tender non distended with active bowel sounds Hip is stable with rom  Left upper extremity shows moderate guarding Pain and minimal edema to left antecubital area nv intact distally Pain with pronation/supination of left upper extremity  Assessment/Plan Assessment: left distal biceps ruptures   Plan: Patient will undergo a left distal biceps open repair by Dr. Veverly Fells at [facility]. Risks benefits and expectations were discussed with the patient. Patient understand risks, benefits and expectations and wishes to proceed.

## 2013-07-23 NOTE — Discharge Instructions (Signed)
Keep the sling on at all times and elevate the left arm when possible. Must keep the splint clean and dry. Ok to wiggle fingers but otherwise no use of the left arm or hand.  Follow up next week with Dr Veverly Fells  And Occupational Therapy  Call [phone] to schedule both appointments on the same day next week.  First Appointment will be for OT for a removable splint and dressing change.  Next appointment will be to come upstairs to see Dr Veverly Fells.  Thank you

## 2013-07-23 NOTE — Anesthesia Procedure Notes (Addendum)
Anesthesia Regional Block:  Supraclavicular block  Pre-Anesthetic Checklist: ,, timeout performed, Correct Patient, Correct Site, Correct Laterality, Correct Procedure, Correct Position, site marked, Risks and benefits discussed,  Surgical consent,  Pre-op evaluation,  At surgeon's request and post-op pain management  Laterality: Left  Prep: chloraprep       Needles:  Injection technique: Single-shot  Needle Type: Echogenic Stimulator Needle     Needle Length: 5cm 5 cm Needle Gauge: 22 and 22 G    Additional Needles:  Procedures: ultrasound guided (picture in chart) and nerve stimulator Supraclavicular block  Nerve Stimulator or Paresthesia:  Response: bicep contraction, 0.45 mA,   Additional Responses:   Narrative:  Start time: 07/23/2013 1:11 PM End time: 07/23/2013 1:21 PM Injection made incrementally with aspirations every 5 mL.  Performed by: Personally  Anesthesiologist: J. Tamela Gammon, MD  Additional Notes: Functioning IV was confirmed and monitors applied.  A 79mm 22ga echogenic arrow stimulator was used. Sterile prep and drape,hand hygiene and sterile gloves were used.Ultrasound guidance: relevant anatomy identified, needle position confirmed, local anesthetic spread visualized around nerve(s)., vascular puncture avoided.  Image printed for medical record.  Negative aspiration and negative test dose prior to incremental administration of local anesthetic. The patient tolerated the procedure well.   Anesthesia Regional Block:   Narrative:

## 2013-07-24 NOTE — Op Note (Signed)
[name] [name], [name] [name]                 ACCOUNT NO.:  [id number]  MEDICAL RECORD NO.:  [id number]  LOCATION:  [facility]                         FACILITY:  [facility]  PHYSICIAN:  Doran Heater. Veverly Fells, M.D. DATE OF BIRTH:  March 22, 1966  DATE OF PROCEDURE:  07/23/2013 DATE OF DISCHARGE:  07/23/2013                              OPERATIVE REPORT   PREOPERATIVE DIAGNOSIS:  Left distal biceps rupture.  POSTOPERATIVE DIAGNOSIS:  Left distal biceps rupture.  PROCEDURE PERFORMED:  Open repair of left distal biceps rupture using single incision EndoButton technique.  ATTENDING SURGEON:  Doran Heater. Veverly Fells, M.D.  ASSISTANT:  Melrose Nakayama, MD  ANESTHESIA:  Regional anesthesia plus general was used.  ESTIMATED BLOOD LOSS:  Minimal.  FLUID REPLACEMENT:  1500 mL of crystalloid.  INSTRUMENT COUNTS:  Correct.  COMPLICATIONS:  There were no complications.  ANTIBIOTICS:  Perioperative antibiotics were given.  INDICATIONS:  The patient is a 48 year old male with a injury to his left distal biceps.  Presented with clinical findings consistent with a distal biceps rupture including weakness with supination and pain.  The patient has an MRI documenting a complete torn distal biceps.  Due to the patient's loss of function and continued pain, we discussed options for management.  The patient elected to proceed with surgical repair to relieve pain and restore function in his arm.  Informed consent obtained.  DESCRIPTION OF PROCEDURE:  After adequate level of anesthesia achieved, the patient was positioned supine on the operating room table.  Left arm sterilely prepped and draped in the usual manner.  Time-out was called. The tourniquet was available on the field but not utilized.  We made a longitudinal forearm incision overlying the radial tuberosity, loupe magnification was utilized, dissection down through subcutaneous tissues using blunt dissection, crossing blood vessels were either cauterized or the  larger vessels were ligated using medium hemoclips.  We gained access bluntly down to the radial tuberosity with max supination.  I was able to do subperiosteal dissection.  The tendon was still attached by a scar tissue to the radial tuberosity and the lacertus fibrosus.  We freed up the tendon from the surrounding soft tissue.  I carefully protected the median nerve and all other neurovascular structures.  We whipstitch the distal biceps tendon with #2 FiberWire suture in a locking baseball stitch fashion and then we tied the EndoButton directly onto the end of the FiberWire suture with about a 2-mm gap between the end of the tendon and the EndoButton itself.  Next, we went ahead and further gained soft tissue exposure, so we could see the radial tuberosity in its entirety.  We used a 4-mm Acorn bur to bur a hole big enough to allow access for the tendon into the radial tuberosity verifying the position of that hole with multiplanar C-arm.  We irrigated while we were doing the burring to remove extraneous bony debris.  We were careful not to dissect to the ulnar side of the radius and keep everything subperiosteal.  We then drilled out the back side of the cortex on the far side of the greater tuberosity with the Beath pin and then over drilled the 4.5 EndoButton drill bit  by Tamala Julian and Alanson Puls. We then passed the sutures which would transport the EndoButton through the hole and on to the far side of the radius.  Once we had gone ahead and flipped the EndoButton and engaged the tendon fully in the hole, we imaged with C-arm to make sure we were flipped in the appropriate position up against bone subperiosteal.  Once we were, we removed our tag sutures prior to our passing sutures and then ranged the elbow.  We had a nice repair with no tension on the repair site itself and nice fill on the tunnel.  We thoroughly irrigated and then closed the subcu with 2-0 Vicryl, followed by a 4-0 running  Monocryl for skin.  Sterile dressing and a long-arm splint were applied.  The patient tolerated the surgery well.     Doran Heater. Veverly Fells, M.D.     SRN/MEDQ  D:  07/23/2013  T:  07/24/2013  Job:  [id number]

## 2013-07-25 ENCOUNTER — Encounter (HOSPITAL_COMMUNITY): Payer: Self-pay | Admitting: Orthopedic Surgery

## 2013-07-25 NOTE — Anesthesia Postprocedure Evaluation (Signed)
Anesthesia Post Note  Patient: [name] [name]  Procedure(s) Performed: Procedure(s) (LRB): LEFT DISTAL BICEPS TENDON REPAIR (Left)  Anesthesia type: general  Patient location: PACU  Post pain: Pain level controlled  Post assessment: Patient's Cardiovascular Status Stable  Last Vitals:  Filed Vitals:   07/23/13 1739  BP: 135/83  Pulse: 80  Temp: 36.1 C  Resp:     Post vital signs: Reviewed and stable  Level of consciousness: sedated  Complications: No apparent anesthesia complications

## 2013-08-27 ENCOUNTER — Encounter (HOSPITAL_COMMUNITY): Payer: Self-pay | Admitting: Emergency Medicine

## 2013-08-27 ENCOUNTER — Emergency Department (HOSPITAL_COMMUNITY)
Admission: EM | Admit: 2013-08-27 | Discharge: 2013-08-27 | Disposition: A | Discharge status: Home / Self Care | Payer: 59 | Attending: Emergency Medicine | Admitting: Emergency Medicine

## 2013-08-27 DIAGNOSIS — K219 Gastro-esophageal reflux disease without esophagitis: Secondary | ICD-10-CM | POA: Insufficient documentation

## 2013-08-27 DIAGNOSIS — R011 Cardiac murmur, unspecified: Secondary | ICD-10-CM | POA: Insufficient documentation

## 2013-08-27 DIAGNOSIS — M129 Arthropathy, unspecified: Secondary | ICD-10-CM | POA: Insufficient documentation

## 2013-08-27 DIAGNOSIS — F1911 Other psychoactive substance abuse, in remission: Secondary | ICD-10-CM | POA: Insufficient documentation

## 2013-08-27 DIAGNOSIS — J45901 Unspecified asthma with (acute) exacerbation: Secondary | ICD-10-CM | POA: Insufficient documentation

## 2013-08-27 DIAGNOSIS — J209 Acute bronchitis, unspecified: Secondary | ICD-10-CM | POA: Insufficient documentation

## 2013-08-27 DIAGNOSIS — F17200 Nicotine dependence, unspecified, uncomplicated: Secondary | ICD-10-CM | POA: Insufficient documentation

## 2013-08-27 DIAGNOSIS — Z86718 Personal history of other venous thrombosis and embolism: Secondary | ICD-10-CM | POA: Insufficient documentation

## 2013-08-27 DIAGNOSIS — M2550 Pain in unspecified joint: Secondary | ICD-10-CM | POA: Insufficient documentation

## 2013-08-27 DIAGNOSIS — A150 Tuberculosis of lung: Secondary | ICD-10-CM | POA: Insufficient documentation

## 2013-08-27 DIAGNOSIS — Z79899 Other long term (current) drug therapy: Secondary | ICD-10-CM | POA: Insufficient documentation

## 2013-08-27 DIAGNOSIS — Z720 Tobacco use: Secondary | ICD-10-CM

## 2013-08-27 MED ORDER — ALBUTEROL (5 MG/ML) CONTINUOUS INHALATION SOLN
7.5000 mg/h | INHALATION_SOLUTION | Freq: Once | RESPIRATORY_TRACT | Status: AC
  Administered 2013-08-27: 7.5 mg/h via RESPIRATORY_TRACT
  Filled 2013-08-27: qty 20

## 2013-08-27 MED ORDER — IPRATROPIUM BROMIDE 0.02 % IN SOLN
0.5000 mg | Freq: Once | RESPIRATORY_TRACT | Status: AC
  Administered 2013-08-27: 0.5 mg via RESPIRATORY_TRACT
  Filled 2013-08-27: qty 2.5

## 2013-08-27 MED ORDER — PREDNISONE 20 MG PO TABS
60.0000 mg | ORAL_TABLET | Freq: Once | ORAL | Status: AC
  Administered 2013-08-27: 60 mg via ORAL
  Filled 2013-08-27: qty 3

## 2013-08-27 MED ORDER — TRAMADOL HCL 50 MG PO TABS
50.0000 mg | ORAL_TABLET | Freq: Once | ORAL | Status: AC
  Administered 2013-08-27: 50 mg via ORAL
  Filled 2013-08-27: qty 1

## 2013-08-27 MED ORDER — ALBUTEROL SULFATE (2.5 MG/3ML) 0.083% IN NEBU
5.0000 mg | INHALATION_SOLUTION | Freq: Once | RESPIRATORY_TRACT | Status: AC
  Administered 2013-08-27: 5 mg via RESPIRATORY_TRACT
  Filled 2013-08-27: qty 6

## 2013-08-27 MED ORDER — PREDNISONE 20 MG PO TABS: ORAL_TABLET | ORAL | Status: DC

## 2013-08-27 NOTE — ED Notes (Signed)
Pt states hx of asthma, this current attack started 5 hours ago. Pt has home inhalers and nebulizers that he used an hour long treatment prior to arrival when the difficulty breathing started. Pt voice is horse, pt able to ambulate.

## 2013-08-27 NOTE — Discharge Instructions (Signed)
Acute Bronchitis Bronchitis is inflammation of the airways that extend from the windpipe into the lungs (bronchi). The inflammation often causes mucus to develop. This leads to a cough, which is the most common symptom of bronchitis.  In acute bronchitis, the condition usually develops suddenly and goes away over time, usually in a couple weeks. Smoking, allergies, and asthma can make bronchitis worse. Repeated episodes of bronchitis may cause further lung problems.  CAUSES Acute bronchitis is most often caused by the same virus that causes a cold. The virus can spread from person to person (contagious).  SIGNS AND SYMPTOMS   Cough.   Fever.   Coughing up mucus.   Body aches.   Chest congestion.   Chills.   Shortness of breath.   Sore throat.  DIAGNOSIS  Acute bronchitis is usually diagnosed through a physical exam. Tests, such as chest X-rays, are sometimes done to rule out other conditions.  TREATMENT  Acute bronchitis usually goes away in a couple weeks. Often times, no medical treatment is necessary. Medicines are sometimes given for relief of fever or cough. Antibiotics are usually not needed but may be prescribed in certain situations. In some cases, an inhaler may be recommended to help reduce shortness of breath and control the cough. A cool mist vaporizer may also be used to help thin bronchial secretions and make it easier to clear the chest.  HOME CARE INSTRUCTIONS  Get plenty of rest.   Drink enough fluids to keep your urine clear or pale yellow (unless you have a medical condition that requires fluid restriction). Increasing fluids may help thin your secretions and will prevent dehydration.   Only take over-the-counter or prescription medicines as directed by your health care provider.   Avoid smoking and secondhand smoke. Exposure to cigarette smoke or irritating chemicals will make bronchitis worse. If you are a smoker, consider using nicotine gum or skin  patches to help control withdrawal symptoms. Quitting smoking will help your lungs heal faster.   Reduce the chances of another bout of acute bronchitis by washing your hands frequently, avoiding people with cold symptoms, and trying not to touch your hands to your mouth, nose, or eyes.   Follow up with your health care provider as directed.  SEEK MEDICAL CARE IF: Your symptoms do not improve after 1 week of treatment.  SEEK IMMEDIATE MEDICAL CARE IF:  You develop an increased fever or chills.   You have chest pain.   You have severe shortness of breath.  You have bloody sputum.   You develop dehydration.  You develop fainting.  You develop repeated vomiting.  You develop a severe headache. MAKE SURE YOU:   Understand these instructions.  Will watch your condition.  Will get help right away if you are not doing well or get worse. Document Released: 08/03/2004 Document Revised: 02/26/2013 Document Reviewed: 12/17/2012 [facility] Patient Information 2014 [place].  Asthma Attack Prevention Although there is no way to prevent asthma from starting, you can take steps to control the disease and reduce its symptoms. Learn about your asthma and how to control it. Take an active role to control your asthma by working with your health care provider to create and follow an asthma action plan. An asthma action plan guides you in:  Taking your medicines properly.  Avoiding things that set off your asthma or make your asthma worse (asthma triggers).  Tracking your level of asthma control.  Responding to worsening asthma.  Seeking emergency care when needed. To  track your asthma, keep records of your symptoms, check your peak flow number using a handheld device that shows how well air moves out of your lungs (peak flow meter), and get regular asthma checkups.  WHAT ARE SOME WAYS TO PREVENT AN ASTHMA ATTACK?  Take medicines as directed by your health care  provider.  Keep track of your asthma symptoms and level of control.  With your health care provider, write a detailed plan for taking medicines and managing an asthma attack. Then be sure to follow your action plan. Asthma is an ongoing condition that needs regular monitoring and treatment.  Identify and avoid asthma triggers. Many outdoor allergens and irritants (such as pollen, mold, cold air, and air pollution) can trigger asthma attacks. Find out what your asthma triggers are and take steps to avoid them.  Monitor your breathing. Learn to recognize warning signs of an attack, such as coughing, wheezing, or shortness of breath. Your lung function may decrease before you notice any signs or symptoms, so regularly measure and record your peak airflow with a home peak flow meter.  Identify and treat attacks early. If you act quickly, you are less likely to have a severe attack. You will also need less medicine to control your symptoms. When your peak flow measurements decrease and alert you to an upcoming attack, take your medicine as instructed and immediately stop any activity that may have triggered the attack. If your symptoms do not improve, get medical help.  Pay attention to increasing quick-relief inhaler use. If you find yourself relying on your quick-relief inhaler, your asthma is not under control. See your health care provider about adjusting your treatment. WHAT CAN MAKE MY SYMPTOMS WORSE? A number of common things can set off or make your asthma symptoms worse and cause temporary increased inflammation of your airways. Keep track of your asthma symptoms for several weeks, detailing all the environmental and emotional factors that are linked with your asthma. When you have an asthma attack, go back to your asthma diary to see which factor, or combination of factors, might have contributed to it. Once you know what these factors are, you can take steps to control many of them. If you have  allergies and asthma, it is important to take asthma prevention steps at home. Minimizing contact with the substance to which you are allergic will help prevent an asthma attack. Some triggers and ways to avoid these triggers are: Animal Dander:  Some people are allergic to the flakes of skin or dried saliva from animals with fur or feathers.   There is no such thing as a hypoallergenic dog or cat breed. All dogs or cats can cause allergies, even if they don't shed.  Keep these pets out of your home.  If you are not able to keep a pet outdoors, keep the pet out of your bedroom and other sleeping areas at all times, and keep the door closed.  Remove carpets and furniture covered with cloth from your home. If that is not possible, keep the pet away from fabric-covered furniture and carpets. Dust Mites: Many people with asthma are allergic to dust mites. Dust mites are tiny bugs that are found in every home in mattresses, pillows, carpets, fabric-covered furniture, bedcovers, clothes, stuffed toys, and other fabric-covered items.   Cover your mattress in a special dust-proof cover.  Cover your pillow in a special dust-proof cover, or wash the pillow each week in hot water. Water must be hotter than 130 F (  54.4 C) to kill dust mites. Cold or warm water used with detergent and bleach can also be effective.  Wash the sheets and blankets on your bed each week in hot water.  Try not to sleep or lie on cloth-covered cushions.  Call ahead when traveling and ask for a smoke-free hotel room. Bring your own bedding and pillows in case the hotel only supplies feather pillows and down comforters, which may contain dust mites and cause asthma symptoms.  Remove carpets from your bedroom and those laid on concrete, if you can.  Keep stuffed toys out of the bed, or wash the toys weekly in hot water or cooler water with detergent and bleach. Cockroaches: Many people with asthma are allergic to the  droppings and remains of cockroaches.   Keep food and garbage in closed containers. Never leave food out.  Use poison baits, traps, powders, gels, or paste (for example, boric acid).  If a spray is used to kill cockroaches, stay out of the room until the odor goes away. Indoor Mold:  Fix leaky faucets, pipes, or other sources of water that have mold around them.  Clean floors and moldy surfaces with a fungicide or diluted bleach.  Avoid using humidifiers, vaporizers, or swamp coolers. These can spread molds through the air. Pollen and Outdoor Mold:  When pollen or mold spore counts are high, try to keep your windows closed.  Stay indoors with windows closed from late morning to afternoon. Pollen and some mold spore counts are highest at that time.  Ask your health care provider whether you need to take anti-inflammatory medicine or increase your dose of the medicine before your allergy season starts. Other Irritants to Avoid:  Tobacco smoke is an irritant. If you smoke, ask your health care provider how you can quit. Ask family members to quit smoking too. Do not allow smoking in your home or car.  If possible, do not use a wood-burning stove, kerosene heater, or fireplace. Minimize exposure to all sources of smoke, including to incense, candles, fires, and fireworks.  Try to stay away from strong odors and sprays, such as perfume, talcum powder, hair spray, and paints.  Decrease humidity in your home and use an indoor air cleaning device. Reduce indoor humidity to below 60%. Dehumidifiers or central air conditioners can do this.  Decrease house dust exposure by changing furnace and air cooler filters frequently.  Try to have someone else vacuum for you once or twice a week. Stay out of rooms while they are being vacuumed and for a short while afterward.  If you vacuum, use a dust mask from a hardware store, a double-layered or microfilter vacuum cleaner bag, or a vacuum cleaner  with a HEPA filter.  Sulfites in foods and beverages can be irritants. Do not drink beer or wine or eat dried fruit, processed potatoes, or shrimp if they cause asthma symptoms.  Cold air can trigger an asthma attack. Cover your nose and mouth with a scarf on cold or windy days.  Several health conditions can make asthma more difficult to manage, including a runny nose, sinus infections, reflux disease, psychological stress, and sleep apnea. Work with your health care provider to manage these conditions.  Avoid close contact with people who have a respiratory infection such as a cold or the flu, since your asthma symptoms may get worse if you catch the infection. Wash your hands thoroughly after touching items that may have been handled by people with a respiratory infection.  Get a flu shot every year to protect against the flu virus, which often makes asthma worse for days or weeks. Also get a pneumonia shot if you have not previously had one. Unlike the flu shot, the pneumonia shot does not need to be given yearly. Medicines:  Talk to your health care provider about whether it is safe for you to take aspirin or non-steroidal anti-inflammatory medicines (NSAIDs). In a small number of people with asthma, aspirin and NSAIDs can cause asthma attacks. These medicines must be avoided by people who have known aspirin-sensitive asthma. It is important that people with aspirin-sensitive asthma read labels of all over-the-counter medicines used to treat pain, colds, coughs, and fever.  Beta blockers and ACE inhibitors are other medicines you should discuss with your health care provider. HOW CAN I FIND OUT WHAT I AM ALLERGIC TO? Ask your asthma health care provider about allergy skin testing or blood testing (the RAST test) to identify the allergens to which you are sensitive. If you are found to have allergies, the most important thing to do is to try to avoid exposure to any allergens that you are  sensitive to as much as possible. Other treatments for allergies, such as medicines and allergy shots (immunotherapy) are available.  CAN I EXERCISE? Follow your health care provider's advice regarding asthma treatment before exercising. It is important to maintain a regular exercise program, but vigorous exercise, or exercise in cold, humid, or dry environments can cause asthma attacks, especially for those people who have exercise-induced asthma. Document Released: 06/14/2009 Document Revised: 02/26/2013 Document Reviewed: 01/01/2013 [facility] Patient Information 2014 [place].

## 2013-08-27 NOTE — ED Notes (Signed)
Pt states productive cough as well prior to nebulizer use.

## 2013-08-27 NOTE — ED Provider Notes (Signed)
CSN: [id number]     Arrival date & time 08/27/13  0102 History   First MD Initiated Contact with Patient 08/27/13 0320     Chief Complaint  Patient presents with  . Asthma  . Shortness of Breath     (Consider location/radiation/quality/duration/timing/severity/associated sxs/prior Treatment) HPI  This patient is a 48 yo man with a history of asthma who smokes 1/2 ppd of cigarettes and also has a history of DVT for which he is treated with Xarelto.   He presents with 2 days of a productive cough with nasal congestion and runny nose with sore throat. He has had progressively severe wheezing over the past 24 hrs without adequate response to Albuterol HFA.  Patient denies fever and chest pain. He reports that he has been hospitalized 6-7 times in the past for acute asthma excacerbation.   Past Medical History  Diagnosis Date  . DVT (deep venous thrombosis)     takes Xarelto daily--- in right leg x 2 and lung x 1  . Substance abuse     crack, cocaine, last use 2007  . Tobacco abuse   . DVT (deep venous thrombosis)   . Bronchitis     last time 2 yrs ago  . Heart murmur   . Shortness of breath   . Tuberculosis     ' I test Positive "  . Asthma     uses Albuterol daily as needed  . Cough     smokers  . Arthritis     knees  . Joint pain   . GERD (gastroesophageal reflux disease)     only takes something about 2 times a yr   Past Surgical History  Procedure Laterality Date  . Skin graft  1971  . Distal biceps tendon repair Left 07/23/2013    Procedure: LEFT DISTAL BICEPS TENDON REPAIR;  Surgeon: Augustin Schooling, MD;  Location: [facility];  Service: Orthopedics;  Laterality: Left;   History reviewed. No pertinent family history. History  Substance Use Topics  . Smoking status: Current Every Day Smoker -- 0.25 packs/day for 25 years    Types: Cigarettes  . Smokeless tobacco: Current User  . Alcohol Use: Yes     Comment: occasionally    Review of Systems    Ten point review of  symptoms performed and is negative with the exception of symptoms noted above.  Allergies  Review of patient's allergies indicates no known allergies.  Home Medications   Current Outpatient Rx  Name  Route  Sig  Dispense  Refill  . albuterol (PROVENTIL) (2.5 MG/3ML) 0.083% nebulizer solution   Nebulization   Take 3 mLs (2.5 mg total) by nebulization every 6 (six) hours as needed for wheezing.   75 mL   11   . methocarbamol (ROBAXIN) 500 MG tablet   Oral   Take 1 tablet (500 mg total) by mouth 3 (three) times daily as needed for muscle spasms.   60 tablet   1   . oxyCODONE-acetaminophen (ROXICET) 5-325 MG per tablet   Oral   Take 1-2 tablets by mouth every 4 (four) hours as needed for severe pain.   60 tablet   0   . Rivaroxaban (XARELTO) 20 MG TABS   Oral   Take 1 tablet (20 mg total) by mouth daily.   30 tablet   11    BP 108/75  Pulse 90  Temp(Src) 98.2 F (36.8 C) (Oral)  Resp 16  SpO2 96% Physical Exam Gen: well  developed and well nourished appearing Head: NCAT Eyes: PERL, EOMI Nose: no epistaixis or rhinorrhea Mouth/throat: mucosa is moist and pink Neck: supple, no stridor Lungs: RR 20/min, good air exchange bilaterally, scattered bilateral wheezing, no rhonchi or rales CV: RRR, no murmur, extremities appear well perfused.  Abd: soft, notender, nondistended Back: no ttp, no cva ttp Skin: warm and dry Ext: normal to inspection, no dependent edema Neuro: CN ii-xii grossly intact, no focal deficits Psyche; normal affect,  calm and cooperative.   ED Course  Procedures (including critical care time) Labs Review  MDM   Smoker with asthma here with acute excacerbation. We are treating with albuterol and atrovent nebs and oral steroid. Will re-evaluate for disposition.   Patient feeling better on re-evaluation. Wheezing resolved. Stable for discharge with plan for close outpatient f/u, scheduled Albuterol nebs, prednisone burst.   Elyn Peers,  MD 08/28/13 [phone]

## 2013-09-29 ENCOUNTER — Encounter (HOSPITAL_COMMUNITY): Payer: Self-pay | Admitting: Emergency Medicine

## 2013-09-29 ENCOUNTER — Emergency Department (HOSPITAL_COMMUNITY): Payer: 59

## 2013-09-29 DIAGNOSIS — Z8719 Personal history of other diseases of the digestive system: Secondary | ICD-10-CM | POA: Insufficient documentation

## 2013-09-29 DIAGNOSIS — IMO0002 Reserved for concepts with insufficient information to code with codable children: Secondary | ICD-10-CM | POA: Insufficient documentation

## 2013-09-29 DIAGNOSIS — Z86718 Personal history of other venous thrombosis and embolism: Secondary | ICD-10-CM | POA: Insufficient documentation

## 2013-09-29 DIAGNOSIS — Z7901 Long term (current) use of anticoagulants: Secondary | ICD-10-CM | POA: Insufficient documentation

## 2013-09-29 DIAGNOSIS — Z79899 Other long term (current) drug therapy: Secondary | ICD-10-CM | POA: Insufficient documentation

## 2013-09-29 DIAGNOSIS — J45901 Unspecified asthma with (acute) exacerbation: Secondary | ICD-10-CM | POA: Insufficient documentation

## 2013-09-29 DIAGNOSIS — R011 Cardiac murmur, unspecified: Secondary | ICD-10-CM | POA: Insufficient documentation

## 2013-09-29 DIAGNOSIS — F17200 Nicotine dependence, unspecified, uncomplicated: Secondary | ICD-10-CM | POA: Insufficient documentation

## 2013-09-29 DIAGNOSIS — Z8611 Personal history of tuberculosis: Secondary | ICD-10-CM | POA: Insufficient documentation

## 2013-09-29 DIAGNOSIS — M1710 Unilateral primary osteoarthritis, unspecified knee: Secondary | ICD-10-CM | POA: Insufficient documentation

## 2013-09-29 MED ORDER — ALBUTEROL SULFATE (2.5 MG/3ML) 0.083% IN NEBU
5.0000 mg | INHALATION_SOLUTION | Freq: Once | RESPIRATORY_TRACT | Status: AC
  Administered 2013-09-29: 5 mg via RESPIRATORY_TRACT
  Filled 2013-09-29: qty 6

## 2013-09-29 NOTE — ED Notes (Signed)
Pt. reports SOB with wheezing , productive cough and chest tightness onset this evening unrelieved by home nebulizer treatment .

## 2013-09-30 ENCOUNTER — Emergency Department (HOSPITAL_COMMUNITY)
Admission: EM | Admit: 2013-09-30 | Discharge: 2013-09-30 | Disposition: A | Discharge status: Home / Self Care | Payer: 59 | Attending: Emergency Medicine | Admitting: Emergency Medicine

## 2013-09-30 DIAGNOSIS — Z720 Tobacco use: Secondary | ICD-10-CM

## 2013-09-30 DIAGNOSIS — J45901 Unspecified asthma with (acute) exacerbation: Secondary | ICD-10-CM

## 2013-09-30 LAB — COMPREHENSIVE METABOLIC PANEL
ALT: 24 U/L (ref 0–53)
AST: 21 U/L (ref 0–37)
Albumin: 4 g/dL (ref 3.5–5.2)
Alkaline Phosphatase: 77 U/L (ref 39–117)
BILIRUBIN TOTAL: 0.5 mg/dL (ref 0.3–1.2)
BUN: 9 mg/dL (ref 6–23)
CALCIUM: 9.4 mg/dL (ref 8.4–10.5)
CHLORIDE: 100 meq/L (ref 96–112)
CO2: 19 meq/L (ref 19–32)
CREATININE: 1.09 mg/dL (ref 0.50–1.35)
GFR calc Af Amer: >90 mL/min (ref >90)
GFR, EST NON AFRICAN AMERICAN: 79 mL/min — AB (ref >90)
GLUCOSE: 119 mg/dL — AB (ref 70–99)
Potassium: 3.5 mEq/L — ABNORMAL LOW (ref 3.7–5.3)
Sodium: 141 mEq/L (ref 137–147)
Total Protein: 6.9 g/dL (ref 6.0–8.3)

## 2013-09-30 LAB — CBC WITH DIFFERENTIAL/PLATELET
BASOS ABS: 0 10*3/uL (ref 0.0–0.1)
Basophils Relative: 1 % (ref 0–1)
Eosinophils Absolute: 0.2 10*3/uL (ref 0.0–0.7)
Eosinophils Relative: 2 % (ref 0–5)
HCT: 48.1 % (ref 39.0–52.0)
HEMOGLOBIN: 17 g/dL (ref 13.0–17.0)
LYMPHS ABS: 3.1 10*3/uL (ref 0.7–4.0)
Lymphocytes Relative: 35 % (ref 12–46)
MCH: 32.9 pg (ref 26.0–34.0)
MCHC: 35.3 g/dL (ref 30.0–36.0)
MCV: 93.2 fL (ref 78.0–100.0)
MONO ABS: 0.5 10*3/uL (ref 0.1–1.0)
MONOS PCT: 6 % (ref 3–12)
NEUTROS ABS: 5 10*3/uL (ref 1.7–7.7)
Neutrophils Relative %: 57 % (ref 43–77)
Platelets: 216 10*3/uL (ref 150–400)
RBC: 5.16 MIL/uL (ref 4.22–5.81)
RDW: 13.4 % (ref 11.5–15.5)
WBC: 8.8 10*3/uL (ref 4.0–10.5)

## 2013-09-30 LAB — I-STAT TROPONIN, ED: TROPONIN I, POC: 0.01 ng/mL (ref 0.00–0.08)

## 2013-09-30 MED ORDER — DM-GUAIFENESIN ER 30-600 MG PO TB12
1.0000 | ORAL_TABLET | ORAL | Status: AC
  Administered 2013-09-30: 1 via ORAL
  Filled 2013-09-30: qty 1

## 2013-09-30 MED ORDER — PREDNISONE 20 MG PO TABS: 60.0000 mg | ORAL_TABLET | Freq: Every day | ORAL | Status: DC

## 2013-09-30 MED ORDER — GUAIFENESIN ER 600 MG PO TB12: 1200.0000 mg | ORAL_TABLET | Freq: Two times a day (BID) | ORAL | Status: DC

## 2013-09-30 MED ORDER — PREDNISONE 20 MG PO TABS
60.0000 mg | ORAL_TABLET | Freq: Once | ORAL | Status: AC
  Administered 2013-09-30: 60 mg via ORAL
  Filled 2013-09-30: qty 3

## 2013-09-30 NOTE — ED Notes (Signed)
Waiting on medication from the pharmacy 

## 2013-09-30 NOTE — Discharge Instructions (Signed)
Please stop smoking!  Take medications as prescribed.  Follow up with outpatient clinics for recheck and possible adjustment of your medications.   Asthma, Adult Asthma is a recurring condition in which the airways tighten and narrow. Asthma can make it difficult to breathe. It can cause coughing, wheezing, and shortness of breath. Asthma episodes (also called asthma attacks) range from minor to life-threatening. Asthma cannot be cured, but medicines and lifestyle changes can help control it. CAUSES Asthma is believed to be caused by inherited (genetic) and environmental factors, but its exact cause is unknown. Asthma may be triggered by allergens, lung infections, or irritants in the air. Asthma triggers are different for each person. Common triggers include:   Animal dander.  Dust mites.  Cockroaches.  Pollen from trees or grass.  Mold.  Smoke.  Air pollutants such as dust, household cleaners, hair sprays, aerosol sprays, paint fumes, strong chemicals, or strong odors.  Cold air, weather changes, and winds (which increase molds and pollens in the air).  Strong emotional expressions such as crying or laughing hard.  Stress.  Certain medicines (such as aspirin) or types of drugs (such as beta-blockers).  Sulfites in foods and drinks. Foods and drinks that may contain sulfites include dried fruit, potato chips, and sparkling grape juice.  Infections or inflammatory conditions such as the flu, a cold, or an inflammation of the nasal membranes (rhinitis).  Gastroesophageal reflux disease (GERD).  Exercise or strenuous activity. SYMPTOMS Symptoms may occur immediately after asthma is triggered or many hours later. Symptoms include:  Wheezing.  Excessive nighttime or early morning coughing.  Frequent or severe coughing with a common cold.  Chest tightness.  Shortness of breath. DIAGNOSIS  The diagnosis of asthma is made by a review of your medical history and a physical  exam. Tests may also be performed. These may include:  Lung function studies. These tests show how much air you breath in and out.  Allergy tests.  Imaging tests such as X-rays. TREATMENT  Asthma cannot be cured, but it can usually be controlled. Treatment involves identifying and avoiding your asthma triggers. It also involves medicines. There are 2 classes of medicine used for asthma treatment:   Controller medicines. These prevent asthma symptoms from occurring. They are usually taken every day.  Reliever or rescue medicines. These quickly relieve asthma symptoms. They are used as needed and provide short-term relief. Your health care provider will help you create an asthma action plan. An asthma action plan is a written plan for managing and treating your asthma attacks. It includes a list of your asthma triggers and how they may be avoided. It also includes information on when medicines should be taken and when their dosage should be changed. An action plan may also involve the use of a device called a peak flow meter. A peak flow meter measures how well the lungs are working. It helps you monitor your condition. HOME CARE INSTRUCTIONS   Take medicine as directed by your health care provider. Speak with your health care provider if you have questions about how or when to take the medicines.  Use a peak flow meter as directed by your health care provider. Record and keep track of readings.  Understand and use the action plan to help minimize or stop an asthma attack without needing to seek medical care.  Control your home environment in the following ways to help prevent asthma attacks:  Do not smoke. Avoid being exposed to secondhand smoke.  Change your  heating and air conditioning filter regularly.  Limit your use of fireplaces and wood stoves.  Get rid of pests (such as roaches and mice) and their droppings.  Throw away plants if you see mold on them.  Clean your floors and  dust regularly. Use unscented cleaning products.  Try to have someone else vacuum for you regularly. Stay out of rooms while they are being vacuumed and for a short while afterward. If you vacuum, use a dust mask from a hardware store, a double-layered or microfilter vacuum cleaner bag, or a vacuum cleaner with a HEPA filter.  Replace carpet with wood, tile, or vinyl flooring. Carpet can trap dander and dust.  Use allergy-proof pillows, mattress covers, and box spring covers.  Wash bed sheets and blankets every week in hot water and dry them in a dryer.  Use blankets that are made of polyester or cotton.  Clean bathrooms and kitchens with bleach. If possible, have someone repaint the walls in these rooms with mold-resistant paint. Keep out of the rooms that are being cleaned and painted.  Wash hands frequently. SEEK MEDICAL CARE IF:   You have wheezing, shortness of breath, or a cough even if taking medicine to prevent attacks.  The colored mucus you cough up (sputum) is thicker than usual.  Your sputum changes from clear or white to yellow, green, gray, or bloody.  You have any problems that may be related to the medicines you are taking (such as a rash, itching, swelling, or trouble breathing).  You are using a reliever medicine more than 2 3 times per week.  Your peak flow is still at 50 79% of you personal best after following your action plan for 1 hour. SEEK IMMEDIATE MEDICAL CARE IF:   You seem to be getting worse and are unresponsive to treatment during an asthma attack.  You are short of breath even at rest.  You get short of breath when doing very little physical activity.  You have difficulty eating, drinking, or talking due to asthma symptoms.  You develop chest pain.  You develop a fast heartbeat.  You have a bluish color to your lips or fingernails.  You are lightheaded, dizzy, or faint.  Your peak flow is less than 50% of your personal best.  You have a  fever or persistent symptoms for more than 2 3 days.  You have a fever and symptoms suddenly get worse. MAKE SURE YOU:   Understand these instructions.  Will watch your condition.  Will get help right away if you are not doing well or get worse. Document Released: 06/26/2005 Document Revised: 02/26/2013 Document Reviewed: 01/23/2013 [facility] Patient Information 2014 [place], [place].  Smoking Cessation Quitting smoking is important to your health and has many advantages. However, it is not always easy to quit since nicotine is a very addictive drug. Often times, people try 3 times or more before being able to quit. This document explains the best ways for you to prepare to quit smoking. Quitting takes hard work and a lot of effort, but you can do it. ADVANTAGES OF QUITTING SMOKING  You will live longer, feel better, and live better.  Your body will feel the impact of quitting smoking almost immediately.  Within 20 minutes, blood pressure decreases. Your pulse returns to its normal level.  After 8 hours, carbon monoxide levels in the blood return to normal. Your oxygen level increases.  After 24 hours, the chance of having a heart attack starts to decrease. Your  breath, hair, and body stop smelling like smoke.  After 48 hours, damaged nerve endings begin to recover. Your sense of taste and smell improve.  After 72 hours, the body is virtually free of nicotine. Your bronchial tubes relax and breathing becomes easier.  After 2 to 12 weeks, lungs can hold more air. Exercise becomes easier and circulation improves.  The risk of having a heart attack, stroke, cancer, or lung disease is greatly reduced.  After 1 year, the risk of coronary heart disease is cut in half.  After 5 years, the risk of stroke falls to the same as a nonsmoker.  After 10 years, the risk of lung cancer is cut in half and the risk of other cancers decreases significantly.  After 15 years, the risk of coronary  heart disease drops, usually to the level of a nonsmoker.  If you are pregnant, quitting smoking will improve your chances of having a healthy baby.  The people you live with, especially any children, will be healthier.  You will have extra money to spend on things other than cigarettes. QUESTIONS TO THINK ABOUT BEFORE ATTEMPTING TO QUIT You may want to talk about your answers with your caregiver.  Why do you want to quit?  If you tried to quit in the past, what helped and what did not?  What will be the most difficult situations for you after you quit? How will you plan to handle them?  Who can help you through the tough times? Your family? Friends? A caregiver?  What pleasures do you get from smoking? What ways can you still get pleasure if you quit? Here are some questions to ask your caregiver:  How can you help me to be successful at quitting?  What medicine do you think would be best for me and how should I take it?  What should I do if I need more help?  What is smoking withdrawal like? How can I get information on withdrawal? GET READY  Set a quit date.  Change your environment by getting rid of all cigarettes, ashtrays, matches, and lighters in your home, car, or work. Do not let people smoke in your home.  Review your past attempts to quit. Think about what worked and what did not. GET SUPPORT AND ENCOURAGEMENT You have a better chance of being successful if you have help. You can get support in many ways.  Tell your family, friends, and co-workers that you are going to quit and need their support. Ask them not to smoke around you.  Get individual, group, or telephone counseling and support. Programs are available at [employer] and health centers. Call your local health department for information about programs in your area.  Spiritual beliefs and practices may help some smokers quit.  Download a "quit meter" on your computer to keep track of quit  statistics, such as how long you have gone without smoking, cigarettes not smoked, and money saved.  Get a self-help book about quitting smoking and staying off of tobacco. [place] yourself from urges to smoke. Talk to someone, go for a walk, or occupy your time with a task.  Change your normal routine. Take a different route to work. Drink tea instead of coffee. Eat breakfast in a different place.  Reduce your stress. Take a hot bath, exercise, or read a book.  Plan something enjoyable to do every day. Reward yourself for not smoking.  Explore interactive web-based programs that specialize  in helping you quit. GET MEDICINE AND USE IT CORRECTLY Medicines can help you stop smoking and decrease the urge to smoke. Combining medicine with the above behavioral methods and support can greatly increase your chances of successfully quitting smoking.  Nicotine replacement therapy helps deliver nicotine to your body without the negative effects and risks of smoking. Nicotine replacement therapy includes nicotine gum, lozenges, inhalers, nasal sprays, and skin patches. Some may be available over-the-counter and others require a prescription.  Antidepressant medicine helps people abstain from smoking, but how this works is unknown. This medicine is available by prescription.  Nicotinic receptor partial agonist medicine simulates the effect of nicotine in your brain. This medicine is available by prescription. Ask your caregiver for advice about which medicines to use and how to use them based on your health history. Your caregiver will tell you what side effects to look out for if you choose to be on a medicine or therapy. Carefully read the information on the package. Do not use any other product containing nicotine while using a nicotine replacement product.  RELAPSE OR DIFFICULT SITUATIONS Most relapses occur within the first 3 months after quitting. Do not be  discouraged if you start smoking again. Remember, most people try several times before finally quitting. You may have symptoms of withdrawal because your body is used to nicotine. You may crave cigarettes, be irritable, feel very hungry, cough often, get headaches, or have difficulty concentrating. The withdrawal symptoms are only temporary. They are strongest when you first quit, but they will go away within 10 14 days. To reduce the chances of relapse, try to:  Avoid drinking alcohol. Drinking lowers your chances of successfully quitting.  Reduce the amount of caffeine you consume. Once you quit smoking, the amount of caffeine in your body increases and can give you symptoms, such as a rapid heartbeat, sweating, and anxiety.  Avoid smokers because they can make you want to smoke.  Do not let weight gain distract you. Many smokers will gain weight when they quit, usually less than 10 pounds. Eat a healthy diet and stay active. You can always lose the weight gained after you quit.  Find ways to improve your mood other than smoking. FOR MORE INFORMATION  www.smokefree.gov  Document Released: 06/20/2001 Document Revised: 12/26/2011 Document Reviewed: 10/05/2011 [facility] Patient Information 2014 [place].  Smoking Cessation, Tips for Success If you are ready to quit smoking, congratulations! You have chosen to help yourself be healthier. Cigarettes bring nicotine, tar, carbon monoxide, and other irritants into your body. Your lungs, heart, and blood vessels will be able to work better without these poisons. There are many different ways to quit smoking. Nicotine gum, nicotine patches, a nicotine inhaler, or nicotine nasal spray can help with physical craving. Hypnosis, support groups, and medicines help break the habit of smoking. WHAT THINGS CAN I DO TO MAKE QUITTING EASIER?  Here are some tips to help you quit for good:  Pick a date when you will quit smoking completely. Tell all of your  friends and family about your plan to quit on that date.  Do not try to slowly cut down on the number of cigarettes you are smoking. Pick a quit date and quit smoking completely starting on that day.  Throw away all cigarettes.   Clean and remove all ashtrays from your home, work, and car.   On a card, write down your reasons for quitting. Carry the card with you and read it when you get  the urge to smoke.   Cleanse your body of nicotine. Drink enough water and fluids to keep your urine clear or pale yellow. Do this after quitting to flush the nicotine from your body.   Learn to predict your moods. Do not let a bad situation be your excuse to have a cigarette. Some situations in your life might tempt you into wanting a cigarette.   Never have "just one" cigarette. It leads to wanting another and another. Remind yourself of your decision to quit.   Change habits associated with smoking. If you smoked while driving or when feeling stressed, try other activities to replace smoking. Stand up when drinking your coffee. Brush your teeth after eating. Sit in a different chair when you read the paper. Avoid alcohol while trying to quit, and try to drink fewer caffeinated beverages. Alcohol and caffeine may urge you to smoke.   Avoid foods and drinks that can trigger a desire to smoke, such as sugary or spicy foods and alcohol.   Ask people who smoke not to smoke around you.   Have something planned to do right after eating or having a cup of coffee. For example, plan to take a walk or exercise.   Try a relaxation exercise to calm you down and decrease your stress. Remember, you may be tense and nervous for the first 2 weeks after you quit, but this will pass.   Find new activities to keep your hands busy. Play with a pen, coin, or rubber band. Doodle or draw things on paper.   Brush your teeth right after eating. This will help cut down on the craving for the taste of tobacco after  meals. You can also try mouthwash.   Use oral substitutes in place of cigarettes. Try using lemon drops, carrots, cinnamon sticks, or chewing gum. Keep them handy so they are available when you have the urge to smoke.   When you have the urge to smoke, try deep breathing.   Designate your home as a nonsmoking area.   If you are a heavy smoker, ask your health care provider about a prescription for nicotine chewing gum. It can ease your withdrawal from nicotine.   Reward yourself. Set aside the cigarette money you save and buy yourself something nice.   Look for support from others. Join a support group or smoking cessation program. Ask someone at home or at work to help you with your plan to quit smoking.   Always ask yourself, "Do I need this cigarette or is this just a reflex?" Tell yourself, "Today, I choose not to smoke," or "I do not want to smoke." You are reminding yourself of your decision to quit.  Do not replace cigarette smoking with electronic cigarettes (commonly called e-cigarettes). The safety of e-cigarettes is unknown, and some may contain harmful chemicals.  If you relapse, do not give up! Plan ahead and think about what you will do the next time you get the urge to smoke.  HOW WILL I FEEL WHEN I QUIT SMOKING? You may have symptoms of withdrawal because your body is used to nicotine (the addictive substance in cigarettes). You may crave cigarettes, be irritable, feel very hungry, cough often, get headaches, or have difficulty concentrating. The withdrawal symptoms are only temporary. They are strongest when you first quit but will go away within 10 14 days. When withdrawal symptoms occur, stay in control. Think about your reasons for quitting. Remind yourself that these are signs that your body  is healing and getting used to being without cigarettes. Remember that withdrawal symptoms are easier to treat than the major diseases that smoking can cause.  Even after the  withdrawal is over, expect periodic urges to smoke. However, these cravings are generally short lived and will go away whether you smoke or not. Do not smoke!  WHAT RESOURCES ARE AVAILABLE TO HELP ME QUIT SMOKING? Your health care provider can direct you to community resources or hospitals for support, which may include:  Group support.  Education.  Hypnosis.  Therapy. Document Released: 03/24/2004 Document Revised: 04/16/2013 Document Reviewed: 12/12/2012 [facility] Patient Information 2014 [place], [place].  Smoking Hazards Smoking cigarettes is extremely bad for your health. Tobacco smoke has over 200 known poisons in it. It contains the poisonous gases nitrogen oxide and carbon monoxide. There are over 60 chemicals in tobacco smoke that cause cancer. Some of the chemicals found in cigarette smoke include:   Cyanide.   Benzene.   Formaldehyde.   Methanol (wood alcohol).   Acetylene (fuel used in welding torches).   Ammonia.  Even smoking lightly shortens your life expectancy by several years. You can greatly reduce the risk of medical problems for you and your family by stopping now. Smoking is the most preventable cause of death and disease in our society. Within days of quitting smoking, your circulation improves, you decrease the risk of having a heart attack, and your lung capacity improves. There may be some increased phlegm in the first few days after quitting, and it may take months for your lungs to clear up completely. Quitting for 10 years reduces your risk of developing lung cancer to almost that of a nonsmoker.  WHAT ARE THE RISKS OF SMOKING? Cigarette smokers have an increased risk of many serious medical problems, including:  Lung cancer.   Lung disease (such as pneumonia, bronchitis, and emphysema).   Heart attack and chest pain due to the heart not getting enough oxygen (angina).   Heart disease and peripheral blood vessel disease.   Hypertension.    Stroke.   Oral cancer (cancer of the lip, mouth, or voice box).   Bladder cancer.   Pancreatic cancer.   Cervical cancer.   Pregnancy complications, including premature birth.   Stillbirths and smaller newborn babies, birth defects, and genetic damage to sperm.   Early menopause.   Lower estrogen level for women.   Infertility.   Facial wrinkles.   Blindness.   Increased risk of broken bones (fractures).   Senile dementia.   Stomach ulcers and internal bleeding.   Delayed wound healing and increased risk of complications during surgery. Because of secondhand smoke exposure, children of smokers have an increased risk of the following:   Sudden infant death syndrome (SIDS).   Respiratory infections.   Lung cancer.   Heart disease.   Ear infections.  WHY IS SMOKING ADDICTIVE? Nicotine is the chemical agent in tobacco that is capable of causing addiction or dependence. When you smoke and inhale, nicotine is absorbed rapidly into the bloodstream through your lungs. Both inhaled and noninhaled nicotine may be addictive.  WHAT ARE THE BENEFITS OF QUITTING?  There are many health benefits to quitting smoking. Some are:   The likelihood of developing cancer and heart disease decreases. Health improvements are seen almost immediately.   Blood pressure, pulse rate, and breathing patterns start returning to normal soon after quitting.   People who quit may see an improvement in their overall quality of life.  HOW DO YOU  QUIT SMOKING? Smoking is an addiction with both physical and psychological effects, and longtime habits can be hard to change. Your health care provider can recommend:  Programs and community resources, which may include group support, education, or therapy.  Replacement products, such as patches, gum, and nasal sprays. Use these products only as directed. Do not replace cigarette smoking with electronic cigarettes (commonly  called e-cigarettes). The safety of e-cigarettes is unknown, and some may contain harmful chemicals. FOR MORE INFORMATION  American Lung Association: www.lung.org  American Cancer Society: www.cancer.org Document Released: 08/03/2004 Document Revised: 04/16/2013 Document Reviewed: 12/16/2012 [facility] Patient Information 2014 [place], [place].

## 2013-09-30 NOTE — ED Provider Notes (Signed)
CSN: [id number]     Arrival date & time 09/29/13  2304 History   First MD Initiated Contact with Patient 09/30/13 [phone]     Chief Complaint  Patient presents with  . Shortness of Breath     (Consider location/radiation/quality/duration/timing/severity/associated sxs/prior Treatment) HPI 48 year old male presents to emergency department from home with complaint of shortness of breath, wheezing, cough, and chest tightness.  He reports that he has been using his nebulizer machine, without improvement.  He reports symptoms are worse tonight, but have been ongoing for last 2 weeks.  Patient is still smoking.  Patient is followed by outpatient clinics, has not followed up with them recently.  He denies any fever, no chills.  He estimates that he is taking breathing treatments every 6-8 hours.  Worse at night. Past Medical History  Diagnosis Date  . DVT (deep venous thrombosis)     takes Xarelto daily--- in right leg x 2 and lung x 1  . Substance abuse     crack, cocaine, last use 2007  . Tobacco abuse   . DVT (deep venous thrombosis)   . Bronchitis     last time 2 yrs ago  . Heart murmur   . Shortness of breath   . Tuberculosis     ' I test Positive "  . Asthma     uses Albuterol daily as needed  . Cough     smokers  . Arthritis     knees  . Joint pain   . GERD (gastroesophageal reflux disease)     only takes something about 2 times a yr   Past Surgical History  Procedure Laterality Date  . Skin graft  1971  . Distal biceps tendon repair Left 07/23/2013    Procedure: LEFT DISTAL BICEPS TENDON REPAIR;  Surgeon: Augustin Schooling, MD;  Location: [facility];  Service: Orthopedics;  Laterality: Left;   No family history on file. History  Substance Use Topics  . Smoking status: Current Every Day Smoker -- 0.25 packs/day for 25 years    Types: Cigarettes  . Smokeless tobacco: Current User  . Alcohol Use: Yes     Comment: occasionally    Review of Systems  See History of Present  Illness; otherwise all other systems are reviewed and negative   Allergies  Review of patient's allergies indicates no known allergies.  Home Medications   Current Outpatient Rx  Name  Route  Sig  Dispense  Refill  . albuterol (PROVENTIL) (2.5 MG/3ML) 0.083% nebulizer solution   Nebulization   Take 3 mLs (2.5 mg total) by nebulization every 6 (six) hours as needed for wheezing.   75 mL   11   . HYDROcodone-acetaminophen (NORCO/VICODIN) 5-325 MG per tablet   Oral   Take 1-2 tablets by mouth every 6 (six) hours as needed for moderate pain.         . methocarbamol (ROBAXIN) 500 MG tablet   Oral   Take 1 tablet (500 mg total) by mouth 3 (three) times daily as needed for muscle spasms.   60 tablet   1   . Rivaroxaban (XARELTO) 20 MG TABS   Oral   Take 1 tablet (20 mg total) by mouth daily.   30 tablet   11   . guaiFENesin (MUCINEX) 600 MG 12 hr tablet   Oral   Take 2 tablets (1,200 mg total) by mouth 2 (two) times daily.   30 tablet   0   . predniSONE (DELTASONE) 20  MG tablet   Oral   Take 3 tablets (60 mg total) by mouth daily.   15 tablet   0    BP 96/72  Pulse 77  Temp(Src) 97.9 F (36.6 C) (Oral)  Resp 20  SpO2 99% Physical Exam  Nursing note and vitals reviewed. Constitutional: He is oriented to person, place, and time. He appears well-developed and well-nourished. No distress.  HENT:  Head: Normocephalic and atraumatic.  Right Ear: External ear normal.  Left Ear: External ear normal.  Nose: Nose normal.  Mouth/Throat: Oropharynx is clear and moist.  Eyes: Conjunctivae and EOM are normal. Pupils are equal, round, and reactive to light.  Neck: Normal range of motion. Neck supple. No JVD present. No tracheal deviation present. No thyromegaly present.  Cardiovascular: Normal rate, regular rhythm, normal heart sounds and intact distal pulses.  Exam reveals no gallop and no friction rub.   No murmur heard. Pulmonary/Chest: Effort normal and breath sounds  normal. No stridor. No respiratory distress. He has no wheezes. He has no rales. He exhibits no tenderness.  Patient has received pretreatment prior to my evaluation, no wheezing at this time  Abdominal: Soft. Bowel sounds are normal. He exhibits no distension and no mass. There is no tenderness. There is no rebound and no guarding.  Musculoskeletal: Normal range of motion. He exhibits no edema and no tenderness.  Lymphadenopathy:    He has no cervical adenopathy.  Neurological: He is alert and oriented to person, place, and time. He exhibits normal muscle tone. Coordination normal.  Skin: Skin is warm and dry. No rash noted. No erythema. No pallor.  Psychiatric: He has a normal mood and affect. His behavior is normal. Judgment and thought content normal.    ED Course  Procedures (including critical care time) Labs Review Labs Reviewed  COMPREHENSIVE METABOLIC PANEL - Abnormal; Notable for the following:    Potassium 3.5 (*)    Glucose, Bld 119 (*)    GFR calc non Af Amer 79 (*)    All other components within normal limits  CBC WITH DIFFERENTIAL  Randolm Idol, ED   Imaging Review Dg Chest 2 View  09/30/2013   CLINICAL DATA:  Cough and shortness of breath.  History of asthma.  EXAM: CHEST  2 VIEW  COMPARISON:  Chest x-ray 03/29/2013.  FINDINGS: Lung volumes are normal. No consolidative airspace disease. No pleural effusions. No pneumothorax. No pulmonary nodule or mass noted. Pulmonary vasculature and the cardiomediastinal silhouette are within normal limits.  IMPRESSION: 1.  No radiographic evidence of acute cardiopulmonary disease.   Electronically Signed   By: Vinnie Langton M.D.   On: 09/30/2013 00:04     EKG Interpretation   Date/Time:  Monday September 29 2013 23:20:02 EDT Ventricular Rate:  92 PR Interval:  138 QRS Duration: 84 QT Interval:  352 QTC Calculation: 435 R Axis:   77 Text Interpretation:  Normal sinus rhythm Right atrial enlargement  Borderline ECG Confirmed  by Ahron Hulbert  MD, Harold Mattes ([id number]) on 09/30/2013 2:26:55  AM      MDM   Final diagnoses:  Asthma exacerbation  Tobacco abuse    48 year old male with mild asthma exacerbation.  Patient is actively smoking.  He's been counseled strongly against this and given resources to quit.  He is to followup with  his primary care Dr., at outpatient clinics.    [name], MD 10/01/13 [phone]

## 2014-03-09 ENCOUNTER — Encounter: Payer: Self-pay | Admitting: Internal Medicine

## 2014-03-09 ENCOUNTER — Ambulatory Visit: Payer: Self-pay

## 2014-03-09 ENCOUNTER — Ambulatory Visit: Payer: Self-pay | Admitting: Internal Medicine

## 2014-03-11 ENCOUNTER — Ambulatory Visit: Payer: Self-pay | Admitting: Internal Medicine

## 2014-03-11 ENCOUNTER — Encounter: Payer: Self-pay | Admitting: General Practice

## 2014-07-21 ENCOUNTER — Encounter (HOSPITAL_COMMUNITY): Payer: Self-pay | Admitting: Emergency Medicine

## 2014-07-21 ENCOUNTER — Inpatient Hospital Stay (HOSPITAL_COMMUNITY)
Admission: EM | Admit: 2014-07-21 | Discharge: 2014-07-26 | DRG: 603 | Disposition: A | Discharge status: Home / Self Care | Payer: Self-pay | Attending: Internal Medicine | Admitting: Internal Medicine

## 2014-07-21 DIAGNOSIS — Z7952 Long term (current) use of systemic steroids: Secondary | ICD-10-CM

## 2014-07-21 DIAGNOSIS — I82531 Chronic embolism and thrombosis of right popliteal vein: Secondary | ICD-10-CM | POA: Diagnosis present

## 2014-07-21 DIAGNOSIS — M13862 Other specified arthritis, left knee: Secondary | ICD-10-CM | POA: Diagnosis present

## 2014-07-21 DIAGNOSIS — R52 Pain, unspecified: Secondary | ICD-10-CM

## 2014-07-21 DIAGNOSIS — K219 Gastro-esophageal reflux disease without esophagitis: Secondary | ICD-10-CM | POA: Diagnosis present

## 2014-07-21 DIAGNOSIS — L03115 Cellulitis of right lower limb: Principal | ICD-10-CM | POA: Diagnosis present

## 2014-07-21 DIAGNOSIS — F17200 Nicotine dependence, unspecified, uncomplicated: Secondary | ICD-10-CM | POA: Diagnosis present

## 2014-07-21 DIAGNOSIS — Z7901 Long term (current) use of anticoagulants: Secondary | ICD-10-CM

## 2014-07-21 DIAGNOSIS — M13861 Other specified arthritis, right knee: Secondary | ICD-10-CM | POA: Diagnosis present

## 2014-07-21 DIAGNOSIS — F1910 Other psychoactive substance abuse, uncomplicated: Secondary | ICD-10-CM | POA: Diagnosis present

## 2014-07-21 DIAGNOSIS — I2699 Other pulmonary embolism without acute cor pulmonale: Secondary | ICD-10-CM | POA: Diagnosis present

## 2014-07-21 DIAGNOSIS — F17210 Nicotine dependence, cigarettes, uncomplicated: Secondary | ICD-10-CM | POA: Diagnosis present

## 2014-07-21 DIAGNOSIS — L26 Exfoliative dermatitis: Secondary | ICD-10-CM | POA: Diagnosis present

## 2014-07-21 DIAGNOSIS — R011 Cardiac murmur, unspecified: Secondary | ICD-10-CM | POA: Diagnosis present

## 2014-07-21 DIAGNOSIS — Z86711 Personal history of pulmonary embolism: Secondary | ICD-10-CM

## 2014-07-21 DIAGNOSIS — I82511 Chronic embolism and thrombosis of right femoral vein: Secondary | ICD-10-CM | POA: Diagnosis present

## 2014-07-21 DIAGNOSIS — F1410 Cocaine abuse, uncomplicated: Secondary | ICD-10-CM | POA: Diagnosis present

## 2014-07-21 DIAGNOSIS — J4540 Moderate persistent asthma, uncomplicated: Secondary | ICD-10-CM | POA: Diagnosis present

## 2014-07-21 DIAGNOSIS — J45998 Other asthma: Secondary | ICD-10-CM | POA: Diagnosis present

## 2014-07-21 DIAGNOSIS — I82501 Chronic embolism and thrombosis of unspecified deep veins of right lower extremity: Secondary | ICD-10-CM | POA: Diagnosis present

## 2014-07-21 LAB — CBC WITH DIFFERENTIAL/PLATELET
BASOS PCT: 0 % (ref 0–1)
Basophils Absolute: 0 10*3/uL (ref 0.0–0.1)
EOS ABS: 0.3 10*3/uL (ref 0.0–0.7)
Eosinophils Relative: 3 % (ref 0–5)
HCT: 46.6 % (ref 39.0–52.0)
HEMOGLOBIN: 16 g/dL (ref 13.0–17.0)
LYMPHS ABS: 1.4 10*3/uL (ref 0.7–4.0)
Lymphocytes Relative: 18 % (ref 12–46)
MCH: 32 pg (ref 26.0–34.0)
MCHC: 34.3 g/dL (ref 30.0–36.0)
MCV: 93.2 fL (ref 78.0–100.0)
Monocytes Absolute: 0.5 10*3/uL (ref 0.1–1.0)
Monocytes Relative: 7 % (ref 3–12)
NEUTROS PCT: 72 % (ref 43–77)
Neutro Abs: 5.3 10*3/uL (ref 1.7–7.7)
PLATELETS: 222 10*3/uL (ref 150–400)
RBC: 5 MIL/uL (ref 4.22–5.81)
RDW: 13.3 % (ref 11.5–15.5)
WBC: 7.5 10*3/uL (ref 4.0–10.5)

## 2014-07-21 LAB — BASIC METABOLIC PANEL
ANION GAP: 9 (ref 5–15)
BUN: 6 mg/dL (ref 6–23)
CALCIUM: 8.8 mg/dL (ref 8.4–10.5)
CO2: 26 mmol/L (ref 19–32)
CREATININE: 1.05 mg/dL (ref 0.50–1.35)
Chloride: 102 mEq/L (ref 96–112)
GFR calc Af Amer: >90 mL/min (ref >90)
GFR, EST NON AFRICAN AMERICAN: 82 mL/min — AB (ref >90)
Glucose, Bld: 77 mg/dL (ref 70–99)
Potassium: 3.7 mmol/L (ref 3.5–5.1)
SODIUM: 137 mmol/L (ref 135–145)

## 2014-07-21 MED ORDER — MORPHINE SULFATE 4 MG/ML IJ SOLN
4.0000 mg | Freq: Once | INTRAMUSCULAR | Status: AC
  Administered 2014-07-22: 4 mg via INTRAVENOUS
  Filled 2014-07-21: qty 1

## 2014-07-21 MED ORDER — VANCOMYCIN HCL IN DEXTROSE 1-5 GM/200ML-% IV SOLN
1000.0000 mg | Freq: Once | INTRAVENOUS | Status: AC
  Administered 2014-07-22: 1000 mg via INTRAVENOUS
  Filled 2014-07-21: qty 200
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
## 2014-07-21 NOTE — ED Notes (Signed)
Pt reports increased swelling to R lower leg and rash to upper R Leg.  Leg appears red and warm to touch, skin is peeling- pt sts it is weeping as well. Pt also reports skin on bilateral hands are peeling- hands appear red- no swelling. Denies fevers/chills. Hx cellulitis 2 weeks ago. Pt reports is completely healed then came back.

## 2014-07-21 NOTE — ED Notes (Addendum)
Pt reports that he was seen at a hospital in [place] and dx with cellulitis about 2 weeks ago after receiving a cut to his toe, he received iv antibiotics and went home on po augmentin, states skin had cleared up and came back about 4 days ago. Pt states that he also has had swelling in his rt leg x 1 week, has hx of clots, known clot in rt leg and is on xarelto but pt states leg does not normally swell like it is at present. Pt was admitted to hospital in [place] again yesterday, was only kept for a few hours then released home with more medications. Also has redness and scaling on bilateral hands, dx with eczema and given steroid cream.

## 2014-07-22 ENCOUNTER — Encounter (HOSPITAL_COMMUNITY): Payer: Self-pay | Admitting: Internal Medicine

## 2014-07-22 DIAGNOSIS — L03115 Cellulitis of right lower limb: Principal | ICD-10-CM

## 2014-07-22 DIAGNOSIS — I82501 Chronic embolism and thrombosis of unspecified deep veins of right lower extremity: Secondary | ICD-10-CM

## 2014-07-22 DIAGNOSIS — Z720 Tobacco use: Secondary | ICD-10-CM

## 2014-07-22 DIAGNOSIS — F17200 Nicotine dependence, unspecified, uncomplicated: Secondary | ICD-10-CM | POA: Diagnosis present

## 2014-07-22 DIAGNOSIS — L0390 Cellulitis, unspecified: Secondary | ICD-10-CM | POA: Insufficient documentation

## 2014-07-22 DIAGNOSIS — J4520 Mild intermittent asthma, uncomplicated: Secondary | ICD-10-CM

## 2014-07-22 LAB — BASIC METABOLIC PANEL
ANION GAP: 4 — AB (ref 5–15)
BUN: 8 mg/dL (ref 6–23)
CALCIUM: 8.4 mg/dL (ref 8.4–10.5)
CHLORIDE: 103 meq/L (ref 96–112)
CO2: 28 mmol/L (ref 19–32)
Creatinine, Ser: 0.97 mg/dL (ref 0.50–1.35)
GFR calc Af Amer: >90 mL/min (ref >90)
GFR calc non Af Amer: >90 mL/min (ref >90)
Glucose, Bld: 82 mg/dL (ref 70–99)
Potassium: 3.6 mmol/L (ref 3.5–5.1)
SODIUM: 135 mmol/L (ref 135–145)

## 2014-07-22 LAB — CBC
HCT: 45.3 % (ref 39.0–52.0)
Hemoglobin: 15.4 g/dL (ref 13.0–17.0)
MCH: 31.8 pg (ref 26.0–34.0)
MCHC: 34 g/dL (ref 30.0–36.0)
MCV: 93.6 fL (ref 78.0–100.0)
Platelets: 227 10*3/uL (ref 150–400)
RBC: 4.84 MIL/uL (ref 4.22–5.81)
RDW: 13.4 % (ref 11.5–15.5)
WBC: 5.2 10*3/uL (ref 4.0–10.5)

## 2014-07-22 LAB — RAPID URINE DRUG SCREEN, HOSP PERFORMED
Amphetamines: NOT DETECTED
Barbiturates: NOT DETECTED
Benzodiazepines: NOT DETECTED
Cocaine: POSITIVE — AB
Opiates: POSITIVE — AB
TETRAHYDROCANNABINOL: NOT DETECTED

## 2014-07-22 LAB — PROTIME-INR
INR: 2.61 — ABNORMAL HIGH (ref 0.00–1.49)
Prothrombin Time: 28.1 seconds — ABNORMAL HIGH (ref 11.6–15.2)

## 2014-07-22 LAB — LACTIC ACID, PLASMA: LACTIC ACID, VENOUS: 1.1 mmol/L (ref 0.5–2.2)

## 2014-07-22 LAB — APTT: aPTT: 56 seconds — ABNORMAL HIGH (ref 24–37)

## 2014-07-22 MED ORDER — ALBUTEROL SULFATE (2.5 MG/3ML) 0.083% IN NEBU: 2.5000 mg | INHALATION_SOLUTION | Freq: Four times a day (QID) | RESPIRATORY_TRACT | Status: DC | PRN

## 2014-07-22 MED ORDER — NICOTINE 14 MG/24HR TD PT24
14.0000 mg | MEDICATED_PATCH | Freq: Every day | TRANSDERMAL | Status: DC
  Administered 2014-07-22 – 2014-07-25 (×4): 14 mg via TRANSDERMAL
  Filled 2014-07-22 (×6): qty 1

## 2014-07-22 MED ORDER — SODIUM CHLORIDE 0.9 % IV SOLN
500.0000 mg | Freq: Once | INTRAVENOUS | Status: AC
  Administered 2014-07-22: 500 mg via INTRAVENOUS
  Filled 2014-07-22: qty 500

## 2014-07-22 MED ORDER — RIVAROXABAN 20 MG PO TABS
20.0000 mg | ORAL_TABLET | Freq: Every day | ORAL | Status: DC
  Administered 2014-07-22 – 2014-07-26 (×5): 20 mg via ORAL
  Filled 2014-07-22 (×6): qty 1

## 2014-07-22 MED ORDER — SODIUM CHLORIDE 0.9 % IV SOLN
INTRAVENOUS | Status: DC
  Administered 2014-07-22: 02:00:00 via INTRAVENOUS

## 2014-07-22 MED ORDER — HYDROCERIN EX CREA
TOPICAL_CREAM | Freq: Two times a day (BID) | CUTANEOUS | Status: DC
  Filled 2014-07-22: qty 113

## 2014-07-22 MED ORDER — VANCOMYCIN HCL IN DEXTROSE 1-5 GM/200ML-% IV SOLN
1000.0000 mg | Freq: Three times a day (TID) | INTRAVENOUS | Status: DC
  Administered 2014-07-22 – 2014-07-26 (×13): 1000 mg via INTRAVENOUS
  Filled 2014-07-22 (×15): qty 200

## 2014-07-22 MED ORDER — METHOCARBAMOL 500 MG PO TABS
500.0000 mg | ORAL_TABLET | Freq: Three times a day (TID) | ORAL | Status: DC | PRN
  Administered 2014-07-22 – 2014-07-26 (×6): 500 mg via ORAL
  Filled 2014-07-22 (×6): qty 1

## 2014-07-22 MED ORDER — HYDROCODONE-ACETAMINOPHEN 5-325 MG PO TABS
1.0000 | ORAL_TABLET | ORAL | Status: DC | PRN
  Administered 2014-07-22 – 2014-07-26 (×15): 2 via ORAL
  Filled 2014-07-22 (×14): qty 2

## 2014-07-22 MED ORDER — VANCOMYCIN HCL IN DEXTROSE 1-5 GM/200ML-% IV SOLN: 1000.0000 mg | Freq: Once | INTRAVENOUS | Status: DC

## 2014-07-22 MED ORDER — HYDROCODONE-ACETAMINOPHEN 5-325 MG PO TABS
ORAL_TABLET | ORAL | Status: AC
Start: 2014-07-22 — End: 2014-07-22
  Administered 2014-07-22: 2
  Filled 2014-07-22: qty 2

## 2014-07-22 MED ORDER — SODIUM CHLORIDE 0.9 % IV SOLN
500.0000 mg | Freq: Four times a day (QID) | INTRAVENOUS | Status: DC
  Administered 2014-07-22 – 2014-07-24 (×7): 500 mg via INTRAVENOUS
  Filled 2014-07-22 (×10): qty 500

## 2014-07-22 MED ORDER — SODIUM CHLORIDE 0.9 % IV SOLN
INTRAVENOUS | Status: DC
Start: 2014-07-22 — End: 2014-07-26
  Administered 2014-07-22 – 2014-07-25 (×3): via INTRAVENOUS

## 2014-07-22 MED ORDER — HYDROCERIN EX CREA
TOPICAL_CREAM | Freq: Every day | CUTANEOUS | Status: DC
  Administered 2014-07-22: 11:00:00 via TOPICAL
  Administered 2014-07-23 – 2014-07-24 (×2): 1 via TOPICAL
  Administered 2014-07-25 – 2014-07-26 (×2): via TOPICAL
  Filled 2014-07-22 (×4): qty 113

## 2014-07-22 NOTE — Progress Notes (Addendum)
Patient seen and examined  Agree with Ivor Costa, MD assessment and plan  Cellulitis Patient still has redness and swelling We'll extend coverage to add imipenem in addition to vancomycin Follow-up blood cultures Patient eventually would need a dermatology consult and a skin biopsy as soon as possible to evaluate his eczema dermatitis, patient has been on systemic steroids in [place] with minimal improvement Will order wound care consultation   History of DVT Continue Xarelto.

## 2014-07-22 NOTE — Consult Note (Signed)
WOC wound consult note Reason for Consult: RLE cellulitis.  Pt states this has been ongoing for the past month.   Wound type: Cellulitis Pressure Ulcer POA: N/A Measurement: Affected area encompasses right calf area, right foot Wound bed: No open areas; skin is intact but reddened with severe dryness and flaking noted to area. Drainage (amount, consistency, odor) No drainage noted at this time, however, patient endorses heavy drainage prior to admit. Periwound: Intact; Dry, flaky skin noted. Area is reddened. Dressing procedure/placement/frequency:  Topical treatment is not indicated at this time.  Leave area open to air. Apply Eucerin cream to area after bathing and rough towel drying to remove dry skin.   Orson Gear, RN-BSN, Graduate Student Please re-consult if further assistance is needed.  Thank-you,  Julien Girt MSN, [place], [place], [place], [place]

## 2014-07-22 NOTE — H&P (Signed)
[facility] History and Physical  AMBROSIO REUTER IDP:[id number] DOB: August 08, 1965 DOA: 07/21/2014  Referring physician: ED physician PCP: No PCP Per Patient  Specialists:   Chief Complaint:  right leg swelling, redness and tenderness.  HPI: [name] [name] is a 49 y.o. male with PMH of right leg DVT (on Xarelto), smoking, asthma, GERD, eczema, who presents with right leg swelling, redness and tenderness.  Patient reports that his right leg swelling and tenderness started about month ago. He was evaluated and treated with antibiotics in a hospital in [place] for 3 times. He reports that after he took the antibiotics, he developed eczema in his feet and hands. He could not remember exactly when was his first visit to [facility]. The last 2 times were 2 weeks ago and yesterday, respectively. He reports that he was treated with IV antibiotics and discharged home with Augmentin in [facility] yesterday. He states that he has been compliant with his medication, but his R leg swelling and tenderness have been progressively getting worse. His R leg becomes red and very tender. He does not have fever, but has chills. Patient denies headaches, cough, chest pain, SOB, abdominal pain, diarrhea, constipation, dysuria, urgency, frequency, hematuria.  In ED, patient was found to be afebrile, no leukocytosis, no tachycardia. Patient is admitted to inpatient for further evaluation and treatment.  Review of Systems: As presented in the history of presenting illness, rest negative.  Where does patient live?  At home Can patient participate in ADLs? Yes  Allergy: No Known Allergies  Past Medical History  Diagnosis Date  . DVT (deep venous thrombosis)     takes Xarelto daily--- in right leg x 2 and lung x 1  . Substance abuse     crack, cocaine, last use 2007  . Tobacco abuse   . DVT (deep venous thrombosis)   . Bronchitis     last time 2 yrs ago  . Heart murmur   . Shortness of breath    . Tuberculosis     ' I test Positive "  . Asthma     uses Albuterol daily as needed  . Cough     smokers  . Arthritis     knees  . Joint pain   . GERD (gastroesophageal reflux disease)     only takes something about 2 times a yr    Past Surgical History  Procedure Laterality Date  . Skin graft  1971  . Distal biceps tendon repair Left 07/23/2013    Procedure: LEFT DISTAL BICEPS TENDON REPAIR;  Surgeon: Augustin Schooling, MD;  Location: [facility];  Service: Orthopedics;  Laterality: Left;    Social History:  reports that he has been smoking Cigarettes.  He has a 6.25 pack-year smoking history. He uses smokeless tobacco. He reports that he drinks alcohol. He reports that he uses illicit drugs (Cocaine).  Family History:  Family History  Problem Relation Age of Onset  . Asthma Mother   . Throat cancer Father      Prior to Admission medications   Medication Sig Start Date End Date Taking? Authorizing Provider  albuterol (PROVENTIL) (2.5 MG/3ML) 0.083% nebulizer solution Take 3 mLs (2.5 mg total) by nebulization every 6 (six) hours as needed for wheezing. 07/22/13  Yes Cresenciano Genre, MD  Rivaroxaban (XARELTO) 20 MG TABS Take 1 tablet (20 mg total) by mouth daily. 01/02/13  Yes Hester Mates, MD  guaiFENesin (MUCINEX) 600 MG 12 hr tablet Take 2 tablets (1,200  mg total) by mouth 2 (two) times daily. Patient not taking: Reported on 07/21/2014 09/30/13   [name], MD  methocarbamol (ROBAXIN) 500 MG tablet Take 1 tablet (500 mg total) by mouth 3 (three) times daily as needed for muscle spasms. Patient not taking: Reported on 07/21/2014 07/23/13   Augustin Schooling, MD  predniSONE (DELTASONE) 20 MG tablet Take 3 tablets (60 mg total) by mouth daily. Patient not taking: Reported on 07/21/2014 09/30/13   [name], MD    Physical Exam: Filed Vitals:   07/21/14 2105 07/22/14 0010 07/22/14 0502  BP: 125/94 118/77 113/74  Pulse: 99 70 85  Temp: 97.2 F (36.2 C)  97.9 F (36.6 C)  TempSrc:  Oral  Oral  Resp: 16 14 17   Height: 6\' 4"  (1.93 m)    Weight: 103.42 kg (228 lb)    SpO2: 98% 100% 99%   General: Not in acute distress HEENT:       Eyes: PERRL, EOMI, no scleral icterus       ENT: No discharge from the ears and nose, no pharynx injection, no tonsillar enlargement.        Neck: No JVD, no bruit, no mass felt. Cardiac: S1/S2, RRR, No murmurs, No gallops or rubs Pulm: Good air movement bilaterally. Clear to auscultation bilaterally. No rales, wheezing, rhonchi or rubs. Abd: Soft, nondistended, nontender, no rebound pain, no organomegaly, BS present Ext: Right lower extremity 2+ edema from foot to the knee. There is tenderness to palpation and erythema in this distribution. Compartments soft. 2+DP/PT pulse bilaterally Musculoskeletal: No joint deformities, Skin:  scaly dry rash to bilateral lower extremities extending from feet up to lower leg. Patient has a same rash bilateral hands without erythema or warmth. ft  Neuro: Alert and oriented X3, cranial nerves II-XII grossly intact, muscle strength 5/5 in all extremeties, sensation to light touch intact.  Psych: Patient is not psychotic, no suicidal or hemocidal ideation.  Labs on Admission:  Basic Metabolic Panel:  Recent Labs Lab 07/21/14 2111  NA 137  K 3.7  CL 102  CO2 26  GLUCOSE 77  BUN 6  CREATININE 1.05  CALCIUM 8.8   Liver Function Tests: No results for input(s): AST, ALT, ALKPHOS, BILITOT, PROT, ALBUMIN in the last 168 hours. No results for input(s): LIPASE, AMYLASE in the last 168 hours. No results for input(s): AMMONIA in the last 168 hours. CBC:  Recent Labs Lab 07/21/14 2111  WBC 7.5  NEUTROABS 5.3  HGB 16.0  HCT 46.6  MCV 93.2  PLT 222   Cardiac Enzymes: No results for input(s): CKTOTAL, CKMB, CKMBINDEX, TROPONINI in the last 168 hours.  BNP (last 3 results) No results for input(s): PROBNP in the last 8760 hours. CBG: No results for input(s): GLUCAP in the last 168  hours.  Radiological Exams on Admission: No results found.  Assessment/Plan Principal Problem:   Cellulitis of right leg Active Problems:   Asthma, chronic   GERD   Long-term (current) use of anticoagulants   Polysubstance abuse (tobacco and +UDS for cocaine)   Leg DVT (deep venous thromboembolism), chronic   Pulmonary embolism, bilateral   Tobacco abuse   Cellulitis: Patient's presentation of right leg swelling, redness and tenderness are consistent cellulitis. Currently patient is not septic. No leukocytosis and tachycardia on admission. Patient does not have history of diabetes. -will admit to MedSurg bed -IV vancomycin -Blood cultures 2 -Consult [name] to wound care  -check HIV antibody and uds -check lactic acid level -  IVF: NS 100 cc/h -Pain control  Asthma: Stable. No signs of acute exacerbation. Lung auscultation is clear bilaterally. -Albuterol nebs prn  Smoking: Patient smokes 3-4 cigarettes per day for 26 years. -did counseling about the importance of quitting smoking. Patient would like to consider it seriously. -Nicotine patch  DVT in right leg and hx of PE: on Xarelto. Patient reports that he has been compliant to his medications except for occasional missing dose (he missed 1 dose in the last week). -continue Xarelto  Eczema: Patient has severe eczema in his hands and feet, feet are worse than hands. -May follow up with dermatologist after discharge -consult to wound care team   DVT ppx: SCD  Code Status: Full code Family Communication: None at bed side.     Disposition Plan: Admit to inpatient   Date of Service 07/22/2014    Ivor Costa [facility] Pager [phone]  If 7PM-7AM, please contact night-coverage [email or web address] Password [facility] 07/22/2014, 5:44 AM

## 2014-07-22 NOTE — Progress Notes (Signed)
Utilization review completed. Conception Doebler, RN, BSN. 

## 2014-07-22 NOTE — Progress Notes (Addendum)
ANTIBIOTIC CONSULT NOTE - INITIAL  Pharmacy Consult for vancomycin Indication: recurrent cellulitis  No Known Allergies  Patient Measurements: Height: 6\' 4"  (193 cm) Weight: 228 lb (103.42 kg) IBW/kg (Calculated) : 86.8   Vital Signs: Temp: 97.9 F (36.6 C) (01/13 0502) Temp Source: Oral (01/13 0502) BP: 113/74 mmHg (01/13 0502) Pulse Rate: 85 (01/13 0502) Intake/Output from previous day: 01/12 0701 - 01/13 0700 In: 120 [P.O.:120] Out: 210 [Urine:210] Intake/Output from this shift:    Labs:  Recent Labs  07/21/14 2111 07/22/14 0545  WBC 7.5 5.2  HGB 16.0 15.4  PLT 222 227  CREATININE 1.05 0.97   Estimated Creatinine Clearance: 114.3 mL/min (by C-G formula based on Cr of 0.97). No results for input(s): VANCOTROUGH, VANCOPEAK, VANCORANDOM, GENTTROUGH, GENTPEAK, GENTRANDOM, TOBRATROUGH, TOBRAPEAK, TOBRARND, AMIKACINPEAK, AMIKACINTROU, AMIKACIN in the last 72 hours.   Microbiology: No results found for this or any previous visit (from the past 720 hour(s)).  Medical History: Past Medical History  Diagnosis Date  . DVT (deep venous thrombosis)     takes Xarelto daily--- in right leg x 2 and lung x 1  . Substance abuse     crack, cocaine, last use 2007  . Tobacco abuse   . DVT (deep venous thrombosis)   . Bronchitis     last time 2 yrs ago  . Heart murmur   . Shortness of breath   . Tuberculosis     ' I test Positive "  . Asthma     uses Albuterol daily as needed  . Cough     smokers  . Arthritis     knees  . Joint pain   . GERD (gastroesophageal reflux disease)     only takes something about 2 times a yr    Medications:  Prescriptions prior to admission  Medication Sig Dispense Refill Last Dose  . albuterol (PROVENTIL) (2.5 MG/3ML) 0.083% nebulizer solution Take 3 mLs (2.5 mg total) by nebulization every 6 (six) hours as needed for wheezing. 75 mL 11 Past Week at Unknown time  . Rivaroxaban (XARELTO) 20 MG TABS Take 1 tablet (20 mg total) by mouth  daily. 30 tablet 11 07/20/2014 at Unknown time  . guaiFENesin (MUCINEX) 600 MG 12 hr tablet Take 2 tablets (1,200 mg total) by mouth 2 (two) times daily. (Patient not taking: Reported on 07/21/2014) 30 tablet 0 Not Taking at Unknown time  . methocarbamol (ROBAXIN) 500 MG tablet Take 1 tablet (500 mg total) by mouth 3 (three) times daily as needed for muscle spasms. (Patient not taking: Reported on 07/21/2014) 60 tablet 1 Not Taking at Unknown time  . predniSONE (DELTASONE) 20 MG tablet Take 3 tablets (60 mg total) by mouth daily. (Patient not taking: Reported on 07/21/2014) 15 tablet 0 Not Taking at Unknown time   Assessment: Pharmacy consulted to dose vancomycin in this 49 yo M with cellulitis RLE, recent RLE DVT.  He was evaluated and treated at a [place] hospitial x 3; last hospital visit 2 weeks ago and yesterday;  I interviewed pt; he said he is allergic to 2 of his recent oral abx. He called home to find out the name of those abx but his girlfriend works 3rd shift and was asleep.  He says he does tolerate doxy and augmentin OK.  He does not know the name of the abx he got at [facility], but thinks he got vancomycin.  Wt 103.4 kg;  vanc 1 gm given in ED at [place]; WCB WNL at 5.2,  afebrile, creat 0.97. Lactic acid WNL.  RLE: no open areas. Dry, flaky skin. WOC rec no topical treatment except Eucerin cream for dry skin.  vanc 1/13>> 1/13 BC X2>>ip 1/13 HIV ip  Goal of Therapy:  Vancomycin trough level 10-15 mcg/ml  Plan:  -vanc 1 gm q8h - anticipate short course IV abx, so hold off on VT - monitor wbc, temp, renal function, culture data, clinical course -f/u w/ pt's girlfriend about what PO abx he is allergic to to guide discharge treatment plan  Eudelia Bunch, Pharm.D. [phone] 07/22/2014 10:09 AM  Pharmacy consulted to add imipenem to vancomycin for cellulitis.  Plan: Imipenem 500 mg IV q6h  Eudelia Bunch, Pharm.D. [phone] 07/22/2014 1:05 PM

## 2014-07-23 ENCOUNTER — Inpatient Hospital Stay (HOSPITAL_COMMUNITY): Payer: Self-pay

## 2014-07-23 LAB — HIV ANTIBODY (ROUTINE TESTING W REFLEX)
HIV 1/HIV 2 AB: NONREACTIVE
HIV 1/O/2 Abs-Index Value: <1 (ref <1.00)

## 2014-07-23 NOTE — Progress Notes (Signed)
[facility] PROGRESS NOTE  AUSTON HALFMANN JFH:[id number] DOB: [birth year] DOA: 07/21/2014 PCP: No PCP Per Patient  Assessment/Plan: Principal Problem:   Cellulitis of right leg Active Problems:   Asthma, chronic   GERD   Long-term (current) use of anticoagulants   Polysubstance abuse (tobacco and +UDS for cocaine)   Leg DVT (deep venous thromboembolism), chronic   Pulmonary embolism, bilateral   Tobacco abuse    Cellulitis: Patient's presentation of right leg swelling, redness and tenderness are consistent cellulitis. Patient does not have history of diabetes. Right leg significantly swollen compared to the left Doppler ultrasound to rule out DVT, also order MRI of the right leg to rule out underlying myositis, abscess -IV vancomycin, IV Primaxin -Blood cultures 2, no growth so far wound care , they recommend application of Eucerin HIV nonreactive Pain control  History of cocaine abuse Discussed with the patient, he cannot recall how long it has been since he last used it  Asthma: Stable. No signs of acute exacerbation. Lung auscultation is clear bilaterally. -Albuterol nebs prn  Smoking: Patient smokes 3-4 cigarettes per day for 26 years. -did counseling about the importance of quitting smoking. Patient would like to consider it seriously. -Nicotine patch  DVT in right leg and hx of PE: on Xarelto. Patient reports that he has been compliant to his medications except for occasional missing dose (he missed 1 dose in the last week). -continue Xarelto  Eczema: Patient has severe eczema in his hands and feet, feet are worse than hands. -May follow up with dermatologist after discharge -consult to wound care team   Code Status: full Family Communication: family updated about patient's clinical progress Disposition Plan:  As above    Brief narrative: RAKIN LEMELLE is a 49 y.o. male with PMH of right leg DVT (on Xarelto), smoking, asthma, GERD, eczema, who presents  with right leg swelling, redness and tenderness.  Patient reports that his right leg swelling and tenderness started about month ago. He was evaluated and treated with antibiotics in a hospital in [place] for 3 times. He reports that after he took the antibiotics, he developed eczema in his feet and hands. He could not remember exactly when was his first visit to [facility]. The last 2 times were 2 weeks ago and yesterday, respectively. He reports that he was treated with IV antibiotics and discharged home with Augmentin in [facility] yesterday. He states that he has been compliant with his medication, but his R leg swelling and tenderness have been progressively getting worse. His R leg becomes red and very tender. He does not have fever, but has chills. Patient denies headaches, cough, chest pain, SOB, abdominal pain, diarrhea, constipation, dysuria, urgency, frequency, hematuria.  In ED, patient was found to be afebrile, no leukocytosis, no tachycardia. Patient is admitted to inpatient for further evaluation and treatment.  Consultants:  None  Procedures:  None  Antibiotics: Primaxin and vancomycin  HPI/Subjective: Patient concerned about DVT, history of DVT in the past, complaining of right leg pain, decreased itching  Objective: Filed Vitals:   07/22/14 0133 07/22/14 0502 07/22/14 2036 07/23/14 0510  BP: 121/77 113/74 106/66 100/64  Pulse: 77 85 89 93  Temp: 98.2 F (36.8 C) 97.9 F (36.6 C) 98.2 F (36.8 C) 98.4 F (36.9 C)  TempSrc: Oral Oral Oral Oral  Resp: 18 17 18 14   Height:      Weight:      SpO2: 100% 99% 97% 98%    Intake/Output Summary (  Last 24 hours) at 07/23/14 1304 Last data filed at 07/23/14 0700  Gross per 24 hour  Intake    240 ml  Output    300 ml  Net    -60 ml    Exam:  General: alert & oriented x 3 In NAD  Cardiovascular: RRR, nl S1 s2  Respiratory: Decreased breath sounds at the bases, scattered rhonchi, no crackles   Abdomen: soft +BS NT/ND, no masses palpable  Extremities: Right lower extremity significantly swollen compared to the left      Data Reviewed: Basic Metabolic Panel:  Recent Labs Lab 07/21/14 2111 07/22/14 0545  NA 137 135  K 3.7 3.6  CL 102 103  CO2 26 28  GLUCOSE 77 82  BUN 6 8  CREATININE 1.05 0.97  CALCIUM 8.8 8.4    Liver Function Tests: No results for input(s): AST, ALT, ALKPHOS, BILITOT, PROT, ALBUMIN in the last 168 hours. No results for input(s): LIPASE, AMYLASE in the last 168 hours. No results for input(s): AMMONIA in the last 168 hours.  CBC:  Recent Labs Lab 07/21/14 2111 07/22/14 0545  WBC 7.5 5.2  NEUTROABS 5.3  --   HGB 16.0 15.4  HCT 46.6 45.3  MCV 93.2 93.6  PLT 222 227    Cardiac Enzymes: No results for input(s): CKTOTAL, CKMB, CKMBINDEX, TROPONINI in the last 168 hours. BNP (last 3 results) No results for input(s): PROBNP in the last 8760 hours.   CBG: No results for input(s): GLUCAP in the last 168 hours.  Recent Results (from the past 240 hour(s))  Culture, blood (routine x 2)     Status: None (Preliminary result)   Collection Time: 07/22/14  5:45 AM  Result Value Ref Range Status   Specimen Description BLOOD LEFT WRIST  Final   Special Requests BOTTLES DRAWN AEROBIC AND ANAEROBIC 10CC EA  Final   Culture   Final           BLOOD CULTURE RECEIVED NO GROWTH TO DATE CULTURE WILL BE HELD FOR 5 DAYS BEFORE ISSUING A FINAL NEGATIVE REPORT Performed at [facility]    Report Status PENDING  Incomplete  Culture, blood (routine x 2)     Status: None (Preliminary result)   Collection Time: 07/22/14  5:58 AM  Result Value Ref Range Status   Specimen Description BLOOD RIGHT ANTECUBITAL  Final   Special Requests BOTTLES DRAWN AEROBIC AND ANAEROBIC 10CC EA  Final   Culture   Final           BLOOD CULTURE RECEIVED NO GROWTH TO DATE CULTURE WILL BE HELD FOR 5 DAYS BEFORE ISSUING A FINAL NEGATIVE REPORT Note: Culture results may be  compromised due to an excessive volume of blood received in culture bottles. Performed at [facility]    Report Status PENDING  Incomplete     Studies: No results found.  Scheduled Meds: . hydrocerin   Topical Daily  . imipenem-cilastatin  500 mg Intravenous Q6H  . nicotine  14 mg Transdermal Daily  . rivaroxaban  20 mg Oral Daily  . vancomycin  1,000 mg Intravenous Q8H   Continuous Infusions: . sodium chloride 100 mL/hr at 07/22/14 0221    Principal Problem:   Cellulitis of right leg Active Problems:   Asthma, chronic   GERD   Long-term (current) use of anticoagulants   Polysubstance abuse (tobacco and +UDS for cocaine)   Leg DVT (deep venous thromboembolism), chronic   Pulmonary embolism, bilateral   Tobacco abuse  Time spent: 40 minutes   [facility] Pager [phone]. If 7PM-7AM, please contact night-coverage at [email or web address], password [facility] 07/23/2014, 1:04 PM  LOS: 2 days

## 2014-07-24 DIAGNOSIS — M7989 Other specified soft tissue disorders: Secondary | ICD-10-CM

## 2014-07-24 MED ORDER — HYDROXYZINE HCL 25 MG PO TABS
25.0000 mg | ORAL_TABLET | Freq: Three times a day (TID) | ORAL | Status: DC | PRN
Start: 2014-07-24 — End: 2014-07-25
  Administered 2014-07-24 – 2014-07-25 (×3): 25 mg via ORAL
  Filled 2014-07-24 (×4): qty 1

## 2014-07-24 NOTE — Progress Notes (Signed)
VASCULAR LAB PRELIMINARY  PRELIMINARY  PRELIMINARY  PRELIMINARY  Bilateral lower extremity venous duplex  completed.    Preliminary report:  Bilateral:  No evidence of acute DVT, superficial thrombosis, or Baker's Cyst.  Chronic DVT noted in the right FV, Pop v.    Melodee Lupe, RVT 07/24/2014, 11:56 AM

## 2014-07-24 NOTE — Progress Notes (Signed)
[facility] PROGRESS NOTE  DAYMEON FISCHMAN UQJ:[id number] DOB: [birth year] DOA: 07/21/2014 PCP: No PCP Per Patient  Assessment/Plan: Principal Problem:   Cellulitis of right leg Active Problems:   Asthma, chronic   GERD   Long-term (current) use of anticoagulants   Polysubstance abuse (tobacco and +UDS for cocaine)   Leg DVT (deep venous thromboembolism), chronic   Pulmonary embolism, bilateral   Tobacco abuse    Cellulitis: Continue vancomycin but discontinue Primaxin Cultures show no growth so far  Right leg significantly swollen compared to the left Doppler ultrasound negative for DVT,  MRI does not show any abscess or osteomyelitis -Blood cultures 2, no growth so far wound care , they recommend application of Eucerin HIV nonreactive Pain control Hydroxyzine for itching Patient feels that his eczema started being treated with clindamycin, has also been on ciprofloxacin and Keflex outpatient with no improvement     History of cocaine abuse Discussed with the patient, he cannot recall how long it has been since he last used it  Asthma: Stable. No signs of acute exacerbation. Lung auscultation is clear bilaterally. -Albuterol nebs prn  Smoking: Patient smokes 3-4 cigarettes per day for 26 years. -did counseling about the importance of quitting smoking. Patient would like to consider it seriously. -Nicotine patch  DVT in right leg and hx of PE: on Xarelto.  -continue Xarelto  Eczema: Patient has severe eczema in his hands and feet, feet are worse than hands. Definitely needs follow up with dermatologist after discharge for skin biopsy Continue topical care Compression stockings on the right leg when cellulitis improves   Code Status: full Family Communication: family updated about patient's clinical progress Disposition Plan: Anticipate discharge tomorrow  Brief narrative: [name] [name] is a 49 y.o. male with PMH of right leg DVT (on Xarelto), smoking,  asthma, GERD, eczema, who presents with right leg swelling, redness and tenderness.  Patient reports that his right leg swelling and tenderness started about month ago. He was evaluated and treated with antibiotics in a hospital in [place] for 3 times. He reports that after he took the antibiotics, he developed eczema in his feet and hands. He could not remember exactly when was his first visit to [facility]. The last 2 times were 2 weeks ago and yesterday, respectively. He reports that he was treated with IV antibiotics and discharged home with Augmentin in [facility] yesterday. He states that he has been compliant with his medication, but his R leg swelling and tenderness have been progressively getting worse. His R leg becomes red and very tender. He does not have fever, but has chills. Patient denies headaches, cough, chest pain, SOB, abdominal pain, diarrhea, constipation, dysuria, urgency, frequency, hematuria.  In ED, patient was found to be afebrile, no leukocytosis, no tachycardia. Patient is admitted to inpatient for further evaluation and treatment.  Consultants:  None  Procedures:  None  Antibiotics: Primaxin and vancomycin  HPI/Subjective: Patient concerned about DVT, history of DVT in the past, complaining of right leg pain, decreased itching  Objective: Filed Vitals:   07/23/14 0510 07/23/14 1300 07/24/14 0053 07/24/14 0523  BP: 100/64 105/67 113/74 114/79  Pulse: 93 95 95 100  Temp: 98.4 F (36.9 C) 98 F (36.7 C) 98.2 F (36.8 C) 98.2 F (36.8 C)  TempSrc: Oral  Oral Oral  Resp: 14 16 17 16   Height:      Weight:      SpO2: 98% 100% 98% 97%    Intake/Output Summary (Last 24  hours) at 07/24/14 1258 Last data filed at 07/24/14 0900  Gross per 24 hour  Intake    840 ml  Output      0 ml  Net    840 ml    Exam:  General: alert & oriented x 3 In NAD  Cardiovascular: RRR, nl S1 s2  Respiratory: Decreased breath sounds at the bases,  scattered rhonchi, no crackles  Abdomen: soft +BS NT/ND, no masses palpable  Extremities: Right lower extremity significantly swollen compared to the left      Data Reviewed: Basic Metabolic Panel:  Recent Labs Lab 07/21/14 2111 07/22/14 0545  NA 137 135  K 3.7 3.6  CL 102 103  CO2 26 28  GLUCOSE 77 82  BUN 6 8  CREATININE 1.05 0.97  CALCIUM 8.8 8.4    Liver Function Tests: No results for input(s): AST, ALT, ALKPHOS, BILITOT, PROT, ALBUMIN in the last 168 hours. No results for input(s): LIPASE, AMYLASE in the last 168 hours. No results for input(s): AMMONIA in the last 168 hours.  CBC:  Recent Labs Lab 07/21/14 2111 07/22/14 0545  WBC 7.5 5.2  NEUTROABS 5.3  --   HGB 16.0 15.4  HCT 46.6 45.3  MCV 93.2 93.6  PLT 222 227    Cardiac Enzymes: No results for input(s): CKTOTAL, CKMB, CKMBINDEX, TROPONINI in the last 168 hours. BNP (last 3 results) No results for input(s): PROBNP in the last 8760 hours.   CBG: No results for input(s): GLUCAP in the last 168 hours.  Recent Results (from the past 240 hour(s))  Culture, blood (routine x 2)     Status: None (Preliminary result)   Collection Time: 07/22/14  5:45 AM  Result Value Ref Range Status   Specimen Description BLOOD LEFT WRIST  Final   Special Requests BOTTLES DRAWN AEROBIC AND ANAEROBIC 10CC EA  Final   Culture   Final           BLOOD CULTURE RECEIVED NO GROWTH TO DATE CULTURE WILL BE HELD FOR 5 DAYS BEFORE ISSUING A FINAL NEGATIVE REPORT Performed at [facility]    Report Status PENDING  Incomplete  Culture, blood (routine x 2)     Status: None (Preliminary result)   Collection Time: 07/22/14  5:58 AM  Result Value Ref Range Status   Specimen Description BLOOD RIGHT ANTECUBITAL  Final   Special Requests BOTTLES DRAWN AEROBIC AND ANAEROBIC 10CC EA  Final   Culture   Final           BLOOD CULTURE RECEIVED NO GROWTH TO DATE CULTURE WILL BE HELD FOR 5 DAYS BEFORE ISSUING A FINAL NEGATIVE  REPORT Note: Culture results may be compromised due to an excessive volume of blood received in culture bottles. Performed at [facility]    Report Status PENDING  Incomplete     Studies: Mr Tibia Fibula Right Wo Contrast  07/24/2014   CLINICAL DATA:  Right leg swelling and tenderness started about a month ago. Treated with antibiotics in [place]. Eczema on hands and feet. Pain around the ankle and foot. Right leg is now red and tender.  EXAM: MRI OF LOWER RIGHT EXTREMITY WITHOUT CONTRAST  TECHNIQUE: Multiplanar, multisequence MR imaging of the right was performed. No intravenous contrast was administered.  COMPARISON:  None.  FINDINGS: There is no marrow signal abnormality. There is no fracture or dislocation. Mild periostitis of the distal right fibular diaphysis. There is no muscle edema. No focal fluid collection or hematoma. Generalized  soft tissue edema in the subcutaneous fat of the right lower leg and ankle. The neurovascular bundles are normal.  IMPRESSION: 1. Generalized soft tissue edema of the right lower leg and ankle which may be reactive versus secondary to cellulitis. No drainable fluid collection. 2. Mild distal right fibular diaphyseal periostitis without marrow signal abnormality or fracture. This appears can be seen with stress reaction.   Electronically Signed   By: Kathreen Devoid   On: 07/24/2014 08:08    Scheduled Meds: . hydrocerin   Topical Daily  . nicotine  14 mg Transdermal Daily  . rivaroxaban  20 mg Oral Daily  . vancomycin  1,000 mg Intravenous Q8H   Continuous Infusions: . sodium chloride 100 mL/hr at 07/23/14 2014    Principal Problem:   Cellulitis of right leg Active Problems:   Asthma, chronic   GERD   Long-term (current) use of anticoagulants   Polysubstance abuse (tobacco and +UDS for cocaine)   Leg DVT (deep venous thromboembolism), chronic   Pulmonary embolism, bilateral   Tobacco abuse    Time spent: 40  minutes   [place] [facility] Pager [phone]. If 7PM-7AM, please contact night-coverage at [email or web address], password [facility] 07/24/2014, 12:58 PM  LOS: 3 days

## 2014-07-24 NOTE — Progress Notes (Signed)
Orthopedic Tech Progress Note Patient Details:  [name] [name] July 07, 1966 [id number]  Ortho Devices Type of Ortho Device: Crutches Ortho Device/Splint Interventions: Application   Errol Ala 07/24/2014, 2:10 PM

## 2014-07-24 NOTE — Evaluation (Signed)
Physical Therapy Evaluation and Discharge  Patient Details Name: [name] [name] MRN: [id number] DOB: 1966/07/05 Today's Date: 07/24/2014   History of Present Illness  49 y.o. male with PMH of right leg DVT (on Xarelto), smoking, asthma, GERD, eczema, who presents with right leg swelling, redness and tenderness. Doppler _(-) for acute DVT.  Clinical Impression  Patient evaluated by Physical Therapy with no further acute PT needs identified. All education has been completed and the patient has no further questions. See below for any follow-up Physial Therapy or equipment needs. PT is signing off. Thank you for this referral.     Follow Up Recommendations No PT follow up;Supervision - Intermittent    Equipment Recommendations  Crutches    Recommendations for Other Services       Precautions / Restrictions Precautions Precautions: None Restrictions Weight Bearing Restrictions: No      Mobility  Bed Mobility Overal bed mobility: Independent                Transfers Overall transfer level: Independent Equipment used: None             General transfer comment: c/o pain with Rt LE WB'ing   Ambulation/Gait Ambulation/Gait assistance: Modified independent (Device/Increase time) Ambulation Distance (Feet): 6 Feet Assistive device: None Gait Pattern/deviations: Step-to pattern;Decreased step length - left;Decreased stance time - right;Antalgic Gait velocity: decr Gait velocity interpretation: Below normal speed for age/gender General Gait Details: pivotal steps around to chair; antalgic gt due to pain; verbally reviewed technique with crutches to off weight Rt LE when in pain; pt verbalized back understanding and stated he has used crutches previously   Science writer    Modified Rankin (Stroke Patients Only)       Balance Overall balance assessment: No apparent balance deficits (not formally assessed)                                            Pertinent Vitals/Pain Pain Assessment: 0-10 Pain Score: 7  Pain Location: Rt LE with WB'ing  Pain Descriptors / Indicators: Aching;Burning Pain Intervention(s): Monitored during session;Premedicated before session;Repositioned    Home Living Family/patient expects to be discharged to:: Private residence Living Arrangements: Spouse/significant other;Children Available Help at Discharge: Family;Available PRN/intermittently Type of Home: House Home Access: Level entry     Home Layout: One level Home Equipment: None      Prior Function Level of Independence: Independent               Hand Dominance        Extremity/Trunk Assessment   Upper Extremity Assessment: Defer to OT evaluation           Lower Extremity Assessment: Overall WFL for tasks assessed (Rt LE pain and edema noted)      Cervical / Trunk Assessment: Normal  Communication   Communication: No difficulties  Cognition Arousal/Alertness: Awake/alert Behavior During Therapy: WFL for tasks assessed/performed Overall Cognitive Status: Within Functional Limits for tasks assessed                      General Comments General comments (skin integrity, edema, etc.): educated on edema control- to keep LEs elevated    Exercises        Assessment/Plan    PT Assessment Patent does not need any further  PT services  PT Diagnosis Difficulty walking;Acute pain   PT Problem List    PT Treatment Interventions     PT Goals (Current goals can be found in the Care Plan section) Acute Rehab PT Goals Patient Stated Goal: to go home soon PT Goal Formulation: All assessment and education complete, DC therapy    Frequency     Barriers to discharge        Co-evaluation               End of Session   Activity Tolerance: Patient tolerated treatment well Patient left: in chair;with call bell/phone within reach Nurse Communication: Mobility status          Time: 9169-4503 PT Time Calculation (min) (ACUTE ONLY): 13 min   Charges:   PT Evaluation $Initial PT Evaluation Tier I: 1 Procedure PT Treatments $Therapeutic Activity: 8-22 mins   PT G CodesGustavus Bryant, [place]  [phone] 07/24/2014, 2:14 PM

## 2014-07-25 LAB — CBC
HEMATOCRIT: 45.7 % (ref 39.0–52.0)
Hemoglobin: 15.1 g/dL (ref 13.0–17.0)
MCH: 31.7 pg (ref 26.0–34.0)
MCHC: 33 g/dL (ref 30.0–36.0)
MCV: 96 fL (ref 78.0–100.0)
PLATELETS: 253 10*3/uL (ref 150–400)
RBC: 4.76 MIL/uL (ref 4.22–5.81)
RDW: 13.3 % (ref 11.5–15.5)
WBC: 6.1 10*3/uL (ref 4.0–10.5)

## 2014-07-25 LAB — COMPREHENSIVE METABOLIC PANEL
ALT: 22 U/L (ref 0–53)
ANION GAP: 3 — AB (ref 5–15)
AST: 21 U/L (ref 0–37)
Albumin: 2.7 g/dL — ABNORMAL LOW (ref 3.5–5.2)
Alkaline Phosphatase: 67 U/L (ref 39–117)
BILIRUBIN TOTAL: 0.3 mg/dL (ref 0.3–1.2)
BUN: 12 mg/dL (ref 6–23)
CO2: 30 mmol/L (ref 19–32)
CREATININE: 1.03 mg/dL (ref 0.50–1.35)
Calcium: 8.6 mg/dL (ref 8.4–10.5)
Chloride: 108 mEq/L (ref 96–112)
GFR calc non Af Amer: 84 mL/min — ABNORMAL LOW (ref >90)
Glucose, Bld: 92 mg/dL (ref 70–99)
Potassium: 4.4 mmol/L (ref 3.5–5.1)
Sodium: 141 mmol/L (ref 135–145)
Total Protein: 5.3 g/dL — ABNORMAL LOW (ref 6.0–8.3)

## 2014-07-25 MED ORDER — HYDROXYZINE HCL 25 MG PO TABS: 25.0000 mg | ORAL_TABLET | Freq: Three times a day (TID) | ORAL | Status: DC | PRN

## 2014-07-25 MED ORDER — HYDROCODONE-ACETAMINOPHEN 5-325 MG PO TABS: 1.0000 | ORAL_TABLET | ORAL | Status: DC | PRN

## 2014-07-25 MED ORDER — METHOCARBAMOL 500 MG PO TABS: 500.0000 mg | ORAL_TABLET | Freq: Three times a day (TID) | ORAL | Status: DC | PRN

## 2014-07-25 MED ORDER — HYDROXYZINE HCL 25 MG PO TABS
50.0000 mg | ORAL_TABLET | Freq: Three times a day (TID) | ORAL | Status: DC | PRN
  Administered 2014-07-25 – 2014-07-26 (×4): 50 mg via ORAL
  Filled 2014-07-25 (×4): qty 2

## 2014-07-25 NOTE — Progress Notes (Signed)
[facility] PROGRESS NOTE  [name] [name] WVP:[id number] DOB: [birth year] DOA: 07/21/2014 PCP: No PCP Per Patient  Assessment/Plan: Principal Problem:   Cellulitis of right leg Active Problems:   Asthma, chronic   GERD   Long-term (current) use of anticoagulants   Polysubstance abuse (tobacco and +UDS for cocaine)   Leg DVT (deep venous thromboembolism), chronic   Pulmonary embolism, bilateral   Tobacco abuse    Cellulitis: Continue vancomycin but discontinued Primaxin 1/15 Cultures show no growth so far  Right leg significantly swollen compared to the left Doppler ultrasound negative for DVT,  MRI does not show any abscess or osteomyelitis -Blood cultures 2, no growth so far wound care , they recommend application of Eucerin HIV nonreactive Pain control Hydroxyzine for itching Generalized body eczema started after the patient took clindamycin, has also been on ciprofloxacin and Keflex outpatient with no improvement Patient needs to be evaluated by dermatologist after discharge Slow to improve, anticipate discharge in one to 2 days Difficulty with weightbearing, patient now has crutches and is able to ambulate  History of cocaine abuse Discussed with the patient, he cannot recall how long it has been since he last used it  Asthma: Stable. No signs of acute exacerbation. Lung auscultation is clear bilaterally. -Albuterol nebs prn  Smoking: Patient smokes 3-4 cigarettes per day for 26 years. -did counseling about the importance of quitting smoking. Patient would like to consider it seriously. -Nicotine patch  DVT in right leg and hx of PE: on Xarelto.  -continue Xarelto  Eczema: Patient has severe eczema in his hands and feet, feet are worse than hands. Definitely needs follow up with dermatologist after discharge for skin biopsy Continue topical care Compression stockings on the right leg when cellulitis improves   Code Status: full Family Communication:  family updated about patient's clinical progress Disposition Plan: Anticipate discharge tomorrow  Brief narrative: [name] [name] is a 49 y.o. male with PMH of right leg DVT (on Xarelto), smoking, asthma, GERD, eczema, who presents with right leg swelling, redness and tenderness.  Patient reports that his right leg swelling and tenderness started about month ago. He was evaluated and treated with antibiotics in a hospital in [place] for 3 times. He reports that after he took the antibiotics, he developed eczema in his feet and hands. He could not remember exactly when was his first visit to [facility]. The last 2 times were 2 weeks ago and yesterday, respectively. He reports that he was treated with IV antibiotics and discharged home with Augmentin in [facility] yesterday. He states that he has been compliant with his medication, but his R leg swelling and tenderness have been progressively getting worse. His R leg becomes red and very tender. He does not have fever, but has chills. Patient denies headaches, cough, chest pain, SOB, abdominal pain, diarrhea, constipation, dysuria, urgency, frequency, hematuria.  In ED, patient was found to be afebrile, no leukocytosis, no tachycardia. Patient is admitted to inpatient for further evaluation and treatment.  Consultants:  None  Procedures:  None  Antibiotics: Primaxin and vancomycin  HPI/Subjective: Patient concerned about DVT, history of DVT in the past, complaining of right leg pain, decreased itching  Objective: Filed Vitals:   07/24/14 0523 07/24/14 1300 07/24/14 1925 07/25/14 0534  BP: 114/79 118/76 104/68 117/80  Pulse: 100 100 81 96  Temp: 98.2 F (36.8 C) 97.9 F (36.6 C) 98.1 F (36.7 C) 98 F (36.7 C)  TempSrc: Oral  Oral Oral  Resp: 16  16 16 16   Height:      Weight:      SpO2: 97% 98% 100% 100%    Intake/Output Summary (Last 24 hours) at 07/25/14 1132 Last data filed at 07/25/14 0534  Gross per 24  hour  Intake   9045 ml  Output      0 ml  Net   9045 ml    Exam:  General: alert & oriented x 3 In NAD  Cardiovascular: RRR, nl S1 s2  Respiratory: Decreased breath sounds at the bases, scattered rhonchi, no crackles  Abdomen: soft +BS NT/ND, no masses palpable  Extremities: Right lower extremity significantly swollen compared to the left      Data Reviewed: Basic Metabolic Panel:  Recent Labs Lab 07/21/14 2111 07/22/14 0545 07/25/14 0440  NA 137 135 141  K 3.7 3.6 4.4  CL 102 103 108  CO2 26 28 30   GLUCOSE 77 82 92  BUN 6 8 12   CREATININE 1.05 0.97 1.03  CALCIUM 8.8 8.4 8.6    Liver Function Tests:  Recent Labs Lab 07/25/14 0440  AST 21  ALT 22  ALKPHOS 67  BILITOT 0.3  PROT 5.3*  ALBUMIN 2.7*   No results for input(s): LIPASE, AMYLASE in the last 168 hours. No results for input(s): AMMONIA in the last 168 hours.  CBC:  Recent Labs Lab 07/21/14 2111 07/22/14 0545 07/25/14 0440  WBC 7.5 5.2 6.1  NEUTROABS 5.3  --   --   HGB 16.0 15.4 15.1  HCT 46.6 45.3 45.7  MCV 93.2 93.6 96.0  PLT 222 227 253    Cardiac Enzymes: No results for input(s): CKTOTAL, CKMB, CKMBINDEX, TROPONINI in the last 168 hours. BNP (last 3 results) No results for input(s): PROBNP in the last 8760 hours.   CBG: No results for input(s): GLUCAP in the last 168 hours.  Recent Results (from the past 240 hour(s))  Culture, blood (routine x 2)     Status: None (Preliminary result)   Collection Time: 07/22/14  5:45 AM  Result Value Ref Range Status   Specimen Description BLOOD LEFT WRIST  Final   Special Requests BOTTLES DRAWN AEROBIC AND ANAEROBIC 10CC EA  Final   Culture   Final           BLOOD CULTURE RECEIVED NO GROWTH TO DATE CULTURE WILL BE HELD FOR 5 DAYS BEFORE ISSUING A FINAL NEGATIVE REPORT Performed at [facility]    Report Status PENDING  Incomplete  Culture, blood (routine x 2)     Status: None (Preliminary result)   Collection Time: 07/22/14  5:58  AM  Result Value Ref Range Status   Specimen Description BLOOD RIGHT ANTECUBITAL  Final   Special Requests BOTTLES DRAWN AEROBIC AND ANAEROBIC 10CC EA  Final   Culture   Final           BLOOD CULTURE RECEIVED NO GROWTH TO DATE CULTURE WILL BE HELD FOR 5 DAYS BEFORE ISSUING A FINAL NEGATIVE REPORT Note: Culture results may be compromised due to an excessive volume of blood received in culture bottles. Performed at [facility]    Report Status PENDING  Incomplete     Studies: Mr Tibia Fibula Right Wo Contrast  07/24/2014   CLINICAL DATA:  Right leg swelling and tenderness started about a month ago. Treated with antibiotics in [place]. Eczema on hands and feet. Pain around the ankle and foot. Right leg is now red and tender.  EXAM: MRI OF LOWER RIGHT  EXTREMITY WITHOUT CONTRAST  TECHNIQUE: Multiplanar, multisequence MR imaging of the right was performed. No intravenous contrast was administered.  COMPARISON:  None.  FINDINGS: There is no marrow signal abnormality. There is no fracture or dislocation. Mild periostitis of the distal right fibular diaphysis. There is no muscle edema. No focal fluid collection or hematoma. Generalized soft tissue edema in the subcutaneous fat of the right lower leg and ankle. The neurovascular bundles are normal.  IMPRESSION: 1. Generalized soft tissue edema of the right lower leg and ankle which may be reactive versus secondary to cellulitis. No drainable fluid collection. 2. Mild distal right fibular diaphyseal periostitis without marrow signal abnormality or fracture. This appears can be seen with stress reaction.   Electronically Signed   By: Kathreen Devoid   On: 07/24/2014 08:08    Scheduled Meds: . hydrocerin   Topical Daily  . nicotine  14 mg Transdermal Daily  . rivaroxaban  20 mg Oral Daily  . vancomycin  1,000 mg Intravenous Q8H   Continuous Infusions: . sodium chloride 100 mL/hr at 07/25/14 8850    Principal Problem:   Cellulitis of right  leg Active Problems:   Asthma, chronic   GERD   Long-term (current) use of anticoagulants   Polysubstance abuse (tobacco and +UDS for cocaine)   Leg DVT (deep venous thromboembolism), chronic   Pulmonary embolism, bilateral   Tobacco abuse    Time spent: 40 minutes   [facility] Pager [phone]. If 7PM-7AM, please contact night-coverage at [email or web address], password [facility] 07/25/2014, 11:32 AM  LOS: 4 days

## 2014-07-25 NOTE — Progress Notes (Signed)
OT Cancellation Note  Patient Details Name: [name] [name] MRN: [id number] DOB: 1966/01/24   Cancelled Treatment:    Reason Eval/Treat Not Completed: OT screened, no needs identified, will sign off . Pt declines need for OT at this time and states he has been getting around on crutches without difficulty. Pt able to get on/off toilet without problems. Acute OT to sign off at this time.    [facility] M   Cyndie Chime, OTR/L Occupational Therapist [phone] (pager)  07/25/2014, 10:37 AM

## 2014-07-25 NOTE — Progress Notes (Signed)
ANTIBIOTIC CONSULT NOTE - FOLLOW UP  Pharmacy Consult for vancomycin Indication: LE cellulitis  Allergies  Allergen Reactions  . Clindamycin/Lincomycin Dermatitis    Severe rash/dermatitis from November 2015 still present Jan 2016 from clinda    Patient Measurements: Height: 6\' 4"  (193 cm) Weight: 228 lb (103.42 kg) IBW/kg (Calculated) : 86.8   Vital Signs: Temp: 98 F (36.7 C) (01/16 0534) Temp Source: Oral (01/16 0534) BP: 117/80 mmHg (01/16 0534) Pulse Rate: 96 (01/16 0534) Intake/Output from previous day: 01/15 0701 - 01/16 0700 In: 9285 [P.O.:1920; I.V.:7365] Out: -  Intake/Output from this shift:    Labs:  Recent Labs  07/25/14 0440  WBC 6.1  HGB 15.1  PLT 253  CREATININE 1.03   Estimated Creatinine Clearance: 107.7 mL/min (by C-G formula based on Cr of 1.03). No results for input(s): VANCOTROUGH, VANCOPEAK, VANCORANDOM, GENTTROUGH, GENTPEAK, GENTRANDOM, TOBRATROUGH, TOBRAPEAK, TOBRARND, AMIKACINPEAK, AMIKACINTROU, AMIKACIN in the last 72 hours.   Microbiology: Recent Results (from the past 720 hour(s))  Culture, blood (routine x 2)     Status: None (Preliminary result)   Collection Time: 07/22/14  5:45 AM  Result Value Ref Range Status   Specimen Description BLOOD LEFT WRIST  Final   Special Requests BOTTLES DRAWN AEROBIC AND ANAEROBIC 10CC EA  Final   Culture   Final           BLOOD CULTURE RECEIVED NO GROWTH TO DATE CULTURE WILL BE HELD FOR 5 DAYS BEFORE ISSUING A FINAL NEGATIVE REPORT Performed at [facility]    Report Status PENDING  Incomplete  Culture, blood (routine x 2)     Status: None (Preliminary result)   Collection Time: 07/22/14  5:58 AM  Result Value Ref Range Status   Specimen Description BLOOD RIGHT ANTECUBITAL  Final   Special Requests BOTTLES DRAWN AEROBIC AND ANAEROBIC 10CC EA  Final   Culture   Final           BLOOD CULTURE RECEIVED NO GROWTH TO DATE CULTURE WILL BE HELD FOR 5 DAYS BEFORE ISSUING A FINAL NEGATIVE  REPORT Note: Culture results may be compromised due to an excessive volume of blood received in culture bottles. Performed at [facility]    Report Status PENDING  Incomplete    Anti-infectives    Start     Dose/Rate Route Frequency Ordered Stop   07/22/14 2000  imipenem-cilastatin (PRIMAXIN) 500 mg in sodium chloride 0.9 % 100 mL IVPB  Status:  Discontinued     500 mg200 mL/hr over 30 Minutes Intravenous Every 6 hours 07/22/14 1304 07/24/14 1258   07/22/14 1400  imipenem-cilastatin (PRIMAXIN) 500 mg in sodium chloride 0.9 % 100 mL IVPB     500 mg200 mL/hr over 30 Minutes Intravenous  Once 07/22/14 1246 07/22/14 1416   07/22/14 1300  vancomycin (VANCOCIN) IVPB 1000 mg/200 mL premix  Status:  Discontinued     1,000 mg200 mL/hr over 60 Minutes Intravenous  Once 07/22/14 1246 07/22/14 1254   07/22/14 1200  vancomycin (VANCOCIN) IVPB 1000 mg/200 mL premix     1,000 mg200 mL/hr over 60 Minutes Intravenous Every 8 hours 07/22/14 0956     07/21/14 2330  vancomycin (VANCOCIN) IVPB 1000 mg/200 mL premix     1,000 mg200 mL/hr over 60 Minutes Intravenous  Once 07/21/14 2328 07/22/14 0109      Assessment: Patient is a 49 y.o M on antibiotic for LE cellulitis.  He remains afebrile, wbc wnl, and all cultures have been negative thus far.  vanc 1/13>> Imipenem 1/13>>11/15  1/13 bcx x2>> ngtd  Goal of Therapy:  Vancomycin trough level 10-15 mcg/ml  Plan:  - continue vancomycin 1gm IV q8h for now.  Will hold off on getting level as patient may be discharged in the next couple of days.   Katherine Tout P 07/25/2014,2:23 PM

## 2014-07-26 MED ORDER — HYDROCERIN EX CREA: 1.0000 "application " | TOPICAL_CREAM | Freq: Two times a day (BID) | CUTANEOUS | Status: DC

## 2014-07-26 MED ORDER — DOXYCYCLINE HYCLATE 100 MG PO CAPS: 100.0000 mg | ORAL_CAPSULE | Freq: Two times a day (BID) | ORAL | Status: DC
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
## 2014-07-26 NOTE — Discharge Instructions (Signed)
° ° ° °  Quindarrius Joplin was admitted to the Hospital on 07/21/2014 and Discharged  07/26/2014 and should be excused from work/school   for 14  days starting 07/21/2014 , may return to work/school without any restrictions.  Call Lala Lund MD, [facility] [phone] with questions.  Thurnell Lose M.D on 07/26/2014,at 10:53 AM  [facility]   Office  [phone]    Follow with Primary MD and a Skin MD in 7 days   Get CBC, CMP, 2 view Chest X ray checked  by Primary MD next visit.    Activity: As tolerated with Full fall precautions use walker/cane & assistance as needed   Disposition Home     Diet: Heart Healthy    For Heart failure patients - Check your Weight same time everyday, if you gain over 2 pounds, or you develop in leg swelling, experience more shortness of breath or chest pain, call your Primary MD immediately. Follow Cardiac Low Salt Diet and 1.8 lit/day fluid restriction.   On your next visit with your primary care physician please Get Medicines reviewed and adjusted.   Please request your Prim.MD to go over all Hospital Tests and Procedure/Radiological results at the follow up, please get all Hospital records sent to your Prim MD by signing hospital release before you go home.   If you experience worsening of your admission symptoms, develop shortness of breath, life threatening emergency, suicidal or homicidal thoughts you must seek medical attention immediately by calling 911 or calling your MD immediately  if symptoms less severe.  You Must read complete instructions/literature along with all the possible adverse reactions/side effects for all the Medicines you take and that have been prescribed to you. Take any new Medicines after you have completely understood and accpet all the possible adverse reactions/side effects.   Do not drive, operating heavy machinery, perform activities at heights, swimming  or participation in water activities or provide baby sitting services if your were admitted for syncope or siezures until you have seen by Primary MD or a Neurologist and advised to do so again.  Do not drive when taking Pain medications.    Do not take more than prescribed Pain, Sleep and Anxiety Medications  Special Instructions: If you have smoked or chewed Tobacco  in the last 2 yrs please stop smoking, stop any regular Alcohol  and or any Recreational drug use.  Wear Seat belts while driving.   Please note  You were cared for by a hospitalist during your hospital stay. If you have any questions about your discharge medications or the care you received while you were in the hospital after you are discharged, you can call the unit and asked to speak with the hospitalist on call if the hospitalist that took care of you is not available. Once you are discharged, your primary care physician will handle any further medical issues. Please note that NO REFILLS for any discharge medications will be authorized once you are discharged, as it is imperative that you return to your primary care physician (or establish a relationship with a primary care physician if you do not have one) for your aftercare needs so that they can reassess your need for medications and monitor your lab values.

## 2014-07-28 LAB — CULTURE, BLOOD (ROUTINE X 2)
CULTURE: NO GROWTH
Culture: NO GROWTH

## 2014-07-30 ENCOUNTER — Emergency Department (HOSPITAL_COMMUNITY)
Admission: EM | Admit: 2014-07-30 | Discharge: 2014-07-31 | Disposition: A | Discharge status: Home / Self Care | Payer: MEDICAID | Attending: Emergency Medicine | Admitting: Emergency Medicine

## 2014-07-30 ENCOUNTER — Encounter (HOSPITAL_COMMUNITY): Payer: Self-pay | Admitting: *Deleted

## 2014-07-30 DIAGNOSIS — Z720 Tobacco use: Secondary | ICD-10-CM | POA: Insufficient documentation

## 2014-07-30 DIAGNOSIS — R609 Edema, unspecified: Secondary | ICD-10-CM

## 2014-07-30 DIAGNOSIS — J45909 Unspecified asthma, uncomplicated: Secondary | ICD-10-CM | POA: Insufficient documentation

## 2014-07-30 DIAGNOSIS — L309 Dermatitis, unspecified: Secondary | ICD-10-CM

## 2014-07-30 DIAGNOSIS — M13861 Other specified arthritis, right knee: Secondary | ICD-10-CM | POA: Insufficient documentation

## 2014-07-30 DIAGNOSIS — Z86718 Personal history of other venous thrombosis and embolism: Secondary | ICD-10-CM | POA: Insufficient documentation

## 2014-07-30 DIAGNOSIS — R600 Localized edema: Secondary | ICD-10-CM | POA: Insufficient documentation

## 2014-07-30 DIAGNOSIS — R011 Cardiac murmur, unspecified: Secondary | ICD-10-CM | POA: Insufficient documentation

## 2014-07-30 DIAGNOSIS — M13862 Other specified arthritis, left knee: Secondary | ICD-10-CM | POA: Insufficient documentation

## 2014-07-30 DIAGNOSIS — T368X5D Adverse effect of other systemic antibiotics, subsequent encounter: Secondary | ICD-10-CM | POA: Insufficient documentation

## 2014-07-30 DIAGNOSIS — Z7901 Long term (current) use of anticoagulants: Secondary | ICD-10-CM | POA: Insufficient documentation

## 2014-07-30 DIAGNOSIS — Z792 Long term (current) use of antibiotics: Secondary | ICD-10-CM | POA: Insufficient documentation

## 2014-07-30 DIAGNOSIS — X58XXXD Exposure to other specified factors, subsequent encounter: Secondary | ICD-10-CM | POA: Insufficient documentation

## 2014-07-30 DIAGNOSIS — Z8611 Personal history of tuberculosis: Secondary | ICD-10-CM | POA: Insufficient documentation

## 2014-07-30 DIAGNOSIS — Z8719 Personal history of other diseases of the digestive system: Secondary | ICD-10-CM | POA: Insufficient documentation

## 2014-07-30 DIAGNOSIS — Z79899 Other long term (current) drug therapy: Secondary | ICD-10-CM | POA: Insufficient documentation

## 2014-07-30 DIAGNOSIS — T50905S Adverse effect of unspecified drugs, medicaments and biological substances, sequela: Secondary | ICD-10-CM

## 2014-07-30 LAB — COMPREHENSIVE METABOLIC PANEL
ALBUMIN: 3 g/dL — AB (ref 3.5–5.2)
ALK PHOS: 65 U/L (ref 39–117)
ALT: 29 U/L (ref 0–53)
AST: 20 U/L (ref 0–37)
Anion gap: 9 (ref 5–15)
BUN: 8 mg/dL (ref 6–23)
CALCIUM: 8.6 mg/dL (ref 8.4–10.5)
CHLORIDE: 106 meq/L (ref 96–112)
CO2: 25 mmol/L (ref 19–32)
Creatinine, Ser: 0.93 mg/dL (ref 0.50–1.35)
GFR calc Af Amer: >90 mL/min (ref >90)
Glucose, Bld: 81 mg/dL (ref 70–99)
POTASSIUM: 4.4 mmol/L (ref 3.5–5.1)
SODIUM: 140 mmol/L (ref 135–145)
TOTAL PROTEIN: 6 g/dL (ref 6.0–8.3)
Total Bilirubin: 0.3 mg/dL (ref 0.3–1.2)

## 2014-07-30 LAB — CBC WITH DIFFERENTIAL/PLATELET
Basophils Absolute: 0 10*3/uL (ref 0.0–0.1)
Basophils Relative: 1 % (ref 0–1)
Eosinophils Absolute: 0.3 10*3/uL (ref 0.0–0.7)
Eosinophils Relative: 4 % (ref 0–5)
HCT: 42.7 % (ref 39.0–52.0)
Hemoglobin: 14.6 g/dL (ref 13.0–17.0)
LYMPHS PCT: 19 % (ref 12–46)
Lymphs Abs: 1.5 10*3/uL (ref 0.7–4.0)
MCH: 32.1 pg (ref 26.0–34.0)
MCHC: 34.2 g/dL (ref 30.0–36.0)
MCV: 93.8 fL (ref 78.0–100.0)
Monocytes Absolute: 0.6 10*3/uL (ref 0.1–1.0)
Monocytes Relative: 8 % (ref 3–12)
NEUTROS PCT: 68 % (ref 43–77)
Neutro Abs: 5.4 10*3/uL (ref 1.7–7.7)
PLATELETS: 297 10*3/uL (ref 150–400)
RBC: 4.55 MIL/uL (ref 4.22–5.81)
RDW: 13.5 % (ref 11.5–15.5)
WBC: 7.9 10*3/uL (ref 4.0–10.5)

## 2014-07-30 NOTE — ED Notes (Signed)
Pt c/o recurrent right foot infection. Pt states he was given an antibiotic weeks ago that he was allergic too. Since then his entire body is covered in eczema, blisters to legs, arms and hands. Pt states she just left the hospital four days ago and nothing is getting better. Pt was admitted for similar symptoms. Denies fever, chills, n/v/d.

## 2014-07-31 MED ORDER — HYDROXYZINE HCL 25 MG PO TABS: 25.0000 mg | ORAL_TABLET | Freq: Three times a day (TID) | ORAL | Status: DC | PRN

## 2014-07-31 MED ORDER — HYDROCERIN EX CREA: 1.0000 "application " | TOPICAL_CREAM | Freq: Two times a day (BID) | CUTANEOUS | Status: DC

## 2014-07-31 NOTE — ED Notes (Signed)
Dr Otter at bedside  

## 2014-07-31 NOTE — ED Notes (Signed)
MD at bedside. 

## 2014-07-31 NOTE — Discharge Instructions (Signed)
Your lab work today did not show any abnormalities.  It is extremely important for you to follow-up with the dermatologist to get to the root cause of your ongoing skin issues and get proper treatment.  It is also important for you to find a local primary care doctor who can manage her medical issues.  In the meantime, after showering use Eucerin cream to help with dry skin from eczema.  Continue using steroid cream as prescribed.  Keep legs elevated when possible.  Use compression stockings to help with swelling of lower extremities.  This can be found in the pharmacy area of most large stores and individual pharmacies.   Eczema Eczema, also called atopic dermatitis, is a skin disorder that causes inflammation of the skin. It causes a red rash and dry, scaly skin. The skin becomes very itchy. Eczema is generally worse during the cooler winter months and often improves with the warmth of summer. Eczema usually starts showing signs in infancy. Some children outgrow eczema, but it may last through adulthood.  CAUSES  The exact cause of eczema is not known, but it appears to run in families. People with eczema often have a family history of eczema, allergies, asthma, or hay fever. Eczema is not contagious. Flare-ups of the condition may be caused by:   Contact with something you are sensitive or allergic to.   Stress. SIGNS AND SYMPTOMS  Dry, scaly skin.   Red, itchy rash.   Itchiness. This may occur before the skin rash and may be very intense.  DIAGNOSIS  The diagnosis of eczema is usually made based on symptoms and medical history. TREATMENT  Eczema cannot be cured, but symptoms usually can be controlled with treatment and other strategies. A treatment plan might include:  Controlling the itching and scratching.   Use over-the-counter antihistamines as directed for itching. This is especially useful at night when the itching tends to be worse.   Use over-the-counter steroid creams  as directed for itching.   Avoid scratching. Scratching makes the rash and itching worse. It may also result in a skin infection (impetigo) due to a break in the skin caused by scratching.   Keeping the skin well moisturized with creams every day. This will seal in moisture and help prevent dryness. Lotions that contain alcohol and water should be avoided because they can dry the skin.   Limiting exposure to things that you are sensitive or allergic to (allergens).   Recognizing situations that cause stress.   Developing a plan to manage stress.  HOME CARE INSTRUCTIONS   Only take over-the-counter or prescription medicines as directed by your health care provider.   Do not use anything on the skin without checking with your health care provider.   Keep baths or showers short (5 minutes) in warm (not hot) water. Use mild cleansers for bathing. These should be unscented. You may add nonperfumed bath oil to the bath water. It is best to avoid soap and bubble bath.   Immediately after a bath or shower, when the skin is still damp, apply a moisturizing ointment to the entire body. This ointment should be a petroleum ointment. This will seal in moisture and help prevent dryness. The thicker the ointment, the better. These should be unscented.   Keep fingernails cut short. Children with eczema may need to wear soft gloves or mittens at night after applying an ointment.   Dress in clothes made of cotton or cotton blends. Dress lightly, because heat increases itching.  A child with eczema should stay away from anyone with fever blisters or cold sores. The virus that causes fever blisters (herpes simplex) can cause a serious skin infection in children with eczema. SEEK MEDICAL CARE IF:   Your itching interferes with sleep.   Your rash gets worse or is not better within 1 week after starting treatment.   You see pus or soft yellow scabs in the rash area.   You have a fever.    You have a rash flare-up after contact with someone who has fever blisters.  Document Released: 06/23/2000 Document Revised: 04/16/2013 Document Reviewed: 01/27/2013 [facility] Patient Information 2015 [place], [place]. This information is not intended to replace advice given to you by your health care provider. Make sure you discuss any questions you have with your health care provider.  Peripheral Edema You have swelling in your legs (peripheral edema). This swelling is due to excess accumulation of salt and water in your body. Edema may be a sign of heart, kidney or liver disease, or a side effect of a medication. It may also be due to problems in the leg veins. Elevating your legs and using special support stockings may be very helpful, if the cause of the swelling is due to poor venous circulation. Avoid long periods of standing, whatever the cause. Treatment of edema depends on identifying the cause. Chips, pretzels, pickles and other salty foods should be avoided. Restricting salt in your diet is almost always needed. Water pills (diuretics) are often used to remove the excess salt and water from your body via urine. These medicines prevent the kidney from reabsorbing sodium. This increases urine flow. Diuretic treatment may also result in lowering of potassium levels in your body. Potassium supplements may be needed if you have to use diuretics daily. Daily weights can help you keep track of your progress in clearing your edema. You should call your caregiver for follow up care as recommended. SEEK IMMEDIATE MEDICAL CARE IF:   You have increased swelling, pain, redness, or heat in your legs.  You develop shortness of breath, especially when lying down.  You develop chest or abdominal pain, weakness, or fainting.  You have a fever. Document Released: 08/03/2004 Document Revised: 09/18/2011 Document Reviewed: 07/14/2009 [facility] Patient Information 2015 [place], [place]. This information is  not intended to replace advice given to you by your health care provider. Make sure you discuss any questions you have with your health care provider.
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
## 2014-08-25 ENCOUNTER — Encounter (HOSPITAL_BASED_OUTPATIENT_CLINIC_OR_DEPARTMENT_OTHER): Payer: Self-pay | Attending: General Surgery

## 2014-08-25 DIAGNOSIS — Z86718 Personal history of other venous thrombosis and embolism: Secondary | ICD-10-CM | POA: Insufficient documentation

## 2014-08-25 DIAGNOSIS — Z7901 Long term (current) use of anticoagulants: Secondary | ICD-10-CM | POA: Insufficient documentation

## 2014-08-25 DIAGNOSIS — I87091 Postthrombotic syndrome with other complications of right lower extremity: Secondary | ICD-10-CM | POA: Insufficient documentation

## 2014-08-26 NOTE — H&P (Signed)
[name] [name], [name] [name]                 ACCOUNT NO.:  [id number]  MEDICAL RECORD NO.:  [id number]  LOCATION:  FOOT                         FACILITY:  [facility]  PHYSICIAN:  Elesa Hacker, M.D.        DATE OF BIRTH:  02/02/1966  DATE OF ADMISSION:  08/25/2014 DATE OF DISCHARGE:                             HISTORY & PHYSICAL   CHIEF COMPLAINT:  Swollen right leg.  HISTORY OF PRESENT ILLNESS:  This is a 49 year old male who had a deep venous thrombosis approximately 13 years ago.  He has been on anticoagulants since that time.  He has had several hospitalizations for pulmonary emboli.  His leg is chronically swollen.  Several weeks ago, it became very very swollen.  He came to the emergency room, was treated for cellulitis and had several draining areas in the leg at that time. Since then, the swelling has gone down a great deal and the drainage has stopped and he thinks he has no open wound.  PAST MEDICAL HISTORY:  As above with a long history of difficulty with venous stasis disease.  In addition, he has had asthma.  He is a cigarette smoker.  He has used cocaine, but he stopped in 2007.  He has had multiple pulmonary embolism and a history of a heart murmur.  PAST SURGICAL HISTORY:  He had a left biceps tendon repair and a skin graft of the left ankle.  SOCIAL HISTORY:  Cigarettes, he continues to smoke.  Alcohol occasionally.  MEDICATIONS: 1. Proventil. 2. Amoxicillin. 3. Doxepin. 4. Hydroxyzine. 5. Xarelto 20 mg daily.  ALLERGIES:  CLINDAMYCIN CAUSES A RASH.  REVIEW OF SYSTEMS:  As above.  PHYSICAL EXAMINATION:  VITAL SIGNS:  Temperature 98.1, pulse 98, respirations 18, blood pressure 112/74. GENERAL APPEARANCE:  Well developed, well nourished. CHEST:  Clear. HEART:  Regular rhythm to me and I hear no murmurs. EXTREMITIES:  Examination of the extremities revealed a right ankle- brachial index of 1.18.  The right leg is considerably more swollen than the left.  Calf measures 42  on right to 38 on left; 28.5 on right to 24 on left at the ankle.  There is no tenderness and no areas of drainage or wounds.  The leg is generally blue in color.  IMPRESSION:  Deep venous thrombosis with multiple areas of thrombus present, no open wounds at present.  PLAN OF TREATMENT:  We will recommend elevation and external pressure in terms of support stockings.  He is going to clinic in [place] and there is where he wants to continue his treatment.     Elesa Hacker, M.D.     RA/MEDQ  D:  08/25/2014  T:  08/26/2014  Job:  [id number]

## 2014-09-27 DIAGNOSIS — L03115 Cellulitis of right lower limb: Secondary | ICD-10-CM | POA: Diagnosis present

## 2014-09-27 DIAGNOSIS — A419 Sepsis, unspecified organism: Principal | ICD-10-CM | POA: Diagnosis present

## 2014-09-27 DIAGNOSIS — Z79899 Other long term (current) drug therapy: Secondary | ICD-10-CM

## 2014-09-27 DIAGNOSIS — Z881 Allergy status to other antibiotic agents status: Secondary | ICD-10-CM

## 2014-09-27 DIAGNOSIS — J45909 Unspecified asthma, uncomplicated: Secondary | ICD-10-CM | POA: Diagnosis present

## 2014-09-27 DIAGNOSIS — Z7901 Long term (current) use of anticoagulants: Secondary | ICD-10-CM

## 2014-09-27 DIAGNOSIS — Z87898 Personal history of other specified conditions: Secondary | ICD-10-CM

## 2014-09-27 DIAGNOSIS — L03116 Cellulitis of left lower limb: Secondary | ICD-10-CM | POA: Diagnosis present

## 2014-09-27 DIAGNOSIS — M13861 Other specified arthritis, right knee: Secondary | ICD-10-CM | POA: Diagnosis present

## 2014-09-27 DIAGNOSIS — T45515A Adverse effect of anticoagulants, initial encounter: Secondary | ICD-10-CM | POA: Diagnosis present

## 2014-09-27 DIAGNOSIS — M13862 Other specified arthritis, left knee: Secondary | ICD-10-CM | POA: Diagnosis present

## 2014-09-27 DIAGNOSIS — L270 Generalized skin eruption due to drugs and medicaments taken internally: Secondary | ICD-10-CM | POA: Diagnosis present

## 2014-09-27 DIAGNOSIS — R011 Cardiac murmur, unspecified: Secondary | ICD-10-CM | POA: Diagnosis present

## 2014-09-27 DIAGNOSIS — I82501 Chronic embolism and thrombosis of unspecified deep veins of right lower extremity: Secondary | ICD-10-CM | POA: Diagnosis present

## 2014-09-27 DIAGNOSIS — K219 Gastro-esophageal reflux disease without esophagitis: Secondary | ICD-10-CM | POA: Diagnosis present

## 2014-09-27 DIAGNOSIS — Z888 Allergy status to other drugs, medicaments and biological substances status: Secondary | ICD-10-CM

## 2014-09-27 DIAGNOSIS — F17210 Nicotine dependence, cigarettes, uncomplicated: Secondary | ICD-10-CM | POA: Diagnosis present

## 2014-09-27 DIAGNOSIS — R600 Localized edema: Secondary | ICD-10-CM | POA: Diagnosis present

## 2014-09-28 ENCOUNTER — Inpatient Hospital Stay (HOSPITAL_COMMUNITY): Payer: Self-pay

## 2014-09-28 ENCOUNTER — Encounter (HOSPITAL_COMMUNITY): Payer: Self-pay | Admitting: Emergency Medicine

## 2014-09-28 ENCOUNTER — Inpatient Hospital Stay (HOSPITAL_COMMUNITY)
Admission: EM | Admit: 2014-09-28 | Discharge: 2014-10-04 | DRG: 872 | Disposition: A | Discharge status: Home / Self Care | Payer: Self-pay | Attending: Family Medicine | Admitting: Family Medicine

## 2014-09-28 DIAGNOSIS — I82501 Chronic embolism and thrombosis of unspecified deep veins of right lower extremity: Secondary | ICD-10-CM | POA: Diagnosis present

## 2014-09-28 DIAGNOSIS — I82509 Chronic embolism and thrombosis of unspecified deep veins of unspecified lower extremity: Secondary | ICD-10-CM

## 2014-09-28 DIAGNOSIS — L0390 Cellulitis, unspecified: Secondary | ICD-10-CM | POA: Diagnosis present

## 2014-09-28 DIAGNOSIS — A419 Sepsis, unspecified organism: Secondary | ICD-10-CM | POA: Diagnosis present

## 2014-09-28 DIAGNOSIS — J4540 Moderate persistent asthma, uncomplicated: Secondary | ICD-10-CM | POA: Diagnosis present

## 2014-09-28 DIAGNOSIS — L03119 Cellulitis of unspecified part of limb: Secondary | ICD-10-CM

## 2014-09-28 HISTORY — DX: Cellulitis, unspecified: L03.90

## 2014-09-28 LAB — COMPREHENSIVE METABOLIC PANEL
ALK PHOS: 48 U/L (ref 39–117)
ALT: 19 U/L (ref 0–53)
ALT: 15 U/L (ref 0–53)
AST: 16 U/L (ref 0–37)
AST: 18 U/L (ref 0–37)
Albumin: 3.3 g/dL — ABNORMAL LOW (ref 3.5–5.2)
Albumin: 2.5 g/dL — ABNORMAL LOW (ref 3.5–5.2)
Alkaline Phosphatase: 55 U/L (ref 39–117)
Anion gap: 14 (ref 5–15)
Anion gap: 6 (ref 5–15)
BILIRUBIN TOTAL: 0.7 mg/dL (ref 0.3–1.2)
BUN: 7 mg/dL (ref 6–23)
BUN: 8 mg/dL (ref 6–23)
CALCIUM: 8.8 mg/dL (ref 8.4–10.5)
CALCIUM: 7.6 mg/dL — AB (ref 8.4–10.5)
CHLORIDE: 101 mmol/L (ref 96–112)
CHLORIDE: 106 mmol/L (ref 96–112)
CO2: 21 mmol/L (ref 19–32)
CO2: 25 mmol/L (ref 19–32)
Creatinine, Ser: 1.23 mg/dL (ref 0.50–1.35)
Creatinine, Ser: 1.14 mg/dL (ref 0.50–1.35)
GFR calc Af Amer: 79 mL/min — ABNORMAL LOW (ref >90)
GFR calc Af Amer: 86 mL/min — ABNORMAL LOW (ref >90)
GFR calc non Af Amer: 74 mL/min — ABNORMAL LOW (ref >90)
GFR, EST NON AFRICAN AMERICAN: 68 mL/min — AB (ref >90)
Glucose, Bld: 111 mg/dL — ABNORMAL HIGH (ref 70–99)
Glucose, Bld: 137 mg/dL — ABNORMAL HIGH (ref 70–99)
Potassium: 3.5 mmol/L (ref 3.5–5.1)
Potassium: 3.3 mmol/L — ABNORMAL LOW (ref 3.5–5.1)
SODIUM: 136 mmol/L (ref 135–145)
Sodium: 137 mmol/L (ref 135–145)
Total Bilirubin: 0.9 mg/dL (ref 0.3–1.2)
Total Protein: 5.7 g/dL — ABNORMAL LOW (ref 6.0–8.3)
Total Protein: 5 g/dL — ABNORMAL LOW (ref 6.0–8.3)

## 2014-09-28 LAB — CBC WITH DIFFERENTIAL/PLATELET
BASOS PCT: 1 % (ref 0–1)
Basophils Absolute: 0 10*3/uL (ref 0.0–0.1)
Basophils Absolute: 0 10*3/uL (ref 0.0–0.1)
Basophils Relative: 1 % (ref 0–1)
EOS ABS: 0.7 10*3/uL (ref 0.0–0.7)
Eosinophils Absolute: 0.3 10*3/uL (ref 0.0–0.7)
Eosinophils Relative: 5 % (ref 0–5)
Eosinophils Relative: 10 % — ABNORMAL HIGH (ref 0–5)
HCT: 48.9 % (ref 39.0–52.0)
HEMATOCRIT: 40.7 % (ref 39.0–52.0)
HEMOGLOBIN: 16.8 g/dL (ref 13.0–17.0)
HEMOGLOBIN: 13.7 g/dL (ref 13.0–17.0)
LYMPHS ABS: 0.8 10*3/uL (ref 0.7–4.0)
Lymphocytes Relative: 12 % (ref 12–46)
Lymphocytes Relative: 19 % (ref 12–46)
Lymphs Abs: 1.3 10*3/uL (ref 0.7–4.0)
MCH: 32 pg (ref 26.0–34.0)
MCH: 31.9 pg (ref 26.0–34.0)
MCHC: 34.4 g/dL (ref 30.0–36.0)
MCHC: 33.7 g/dL (ref 30.0–36.0)
MCV: 93.1 fL (ref 78.0–100.0)
MCV: 94.7 fL (ref 78.0–100.0)
MONO ABS: 0.6 10*3/uL (ref 0.1–1.0)
MONOS PCT: 9 % (ref 3–12)
Monocytes Absolute: 0.5 10*3/uL (ref 0.1–1.0)
Monocytes Relative: 8 % (ref 3–12)
NEUTROS ABS: 4.9 10*3/uL (ref 1.7–7.7)
NEUTROS PCT: 74 % (ref 43–77)
Neutro Abs: 4 10*3/uL (ref 1.7–7.7)
Neutrophils Relative %: 61 % (ref 43–77)
Platelets: 197 10*3/uL (ref 150–400)
Platelets: 232 10*3/uL (ref 150–400)
RBC: 5.25 MIL/uL (ref 4.22–5.81)
RBC: 4.3 MIL/uL (ref 4.22–5.81)
RDW: 13.4 % (ref 11.5–15.5)
RDW: 13.6 % (ref 11.5–15.5)
WBC: 6.6 10*3/uL (ref 4.0–10.5)
WBC: 6.5 10*3/uL (ref 4.0–10.5)

## 2014-09-28 LAB — I-STAT CHEM 8, ED
BUN: 7 mg/dL (ref 6–23)
CHLORIDE: 101 mmol/L (ref 96–112)
Calcium, Ion: 1.1 mmol/L — ABNORMAL LOW (ref 1.12–1.23)
Creatinine, Ser: 1.2 mg/dL (ref 0.50–1.35)
Glucose, Bld: 112 mg/dL — ABNORMAL HIGH (ref 70–99)
HCT: 34 % — ABNORMAL LOW (ref 39.0–52.0)
Hemoglobin: 11.6 g/dL — ABNORMAL LOW (ref 13.0–17.0)
Potassium: 3.3 mmol/L — ABNORMAL LOW (ref 3.5–5.1)
SODIUM: 137 mmol/L (ref 135–145)
TCO2: 18 mmol/L (ref 0–100)

## 2014-09-28 LAB — LACTIC ACID, PLASMA: Lactic Acid, Venous: 1.6 mmol/L (ref 0.5–2.0)

## 2014-09-28 LAB — PROTIME-INR
INR: 3.78 — AB (ref 0.00–1.49)
PROTHROMBIN TIME: 37.6 s — AB (ref 11.6–15.2)

## 2014-09-28 LAB — MRSA PCR SCREENING: MRSA by PCR: POSITIVE — AB

## 2014-09-28 LAB — APTT: aPTT: 64 seconds — ABNORMAL HIGH (ref 24–37)

## 2014-09-28 LAB — I-STAT CG4 LACTIC ACID, ED: Lactic Acid, Venous: 3.6 mmol/L (ref 0.5–2.0)

## 2014-09-28 LAB — CORTISOL: CORTISOL PLASMA: 3.7 ug/dL

## 2014-09-28 LAB — PROCALCITONIN: Procalcitonin: <0.1 ng/mL

## 2014-09-28 LAB — LIPASE, BLOOD: Lipase: 99 U/L — ABNORMAL HIGH (ref 11–59)

## 2014-09-28 LAB — TSH: TSH: 0.877 u[IU]/mL (ref 0.350–4.500)

## 2014-09-28 MED ORDER — VANCOMYCIN HCL IN DEXTROSE 1-5 GM/200ML-% IV SOLN: 1000.0000 mg | Freq: Once | INTRAVENOUS | Status: DC

## 2014-09-28 MED ORDER — HYDROMORPHONE HCL 1 MG/ML IJ SOLN
1.0000 mg | INTRAMUSCULAR | Status: DC | PRN
  Administered 2014-09-28 – 2014-10-04 (×31): 1 mg via INTRAVENOUS
  Filled 2014-09-28 (×31): qty 1

## 2014-09-28 MED ORDER — CLOBETASOL PROPIONATE 0.05 % EX CREA
TOPICAL_CREAM | Freq: Two times a day (BID) | CUTANEOUS | Status: DC
  Administered 2014-09-28: 12:00:00 via TOPICAL
  Administered 2014-09-28: 1 via TOPICAL
  Filled 2014-09-28 (×2): qty 15

## 2014-09-28 MED ORDER — MORPHINE SULFATE 2 MG/ML IJ SOLN
INTRAMUSCULAR | Status: AC
  Administered 2014-09-28: 1 mg via INTRAVENOUS
  Filled 2014-09-28: qty 1

## 2014-09-28 MED ORDER — ONDANSETRON HCL 4 MG/2ML IJ SOLN: 4.0000 mg | Freq: Four times a day (QID) | INTRAMUSCULAR | Status: DC | PRN

## 2014-09-28 MED ORDER — SODIUM CHLORIDE 0.9 % IV BOLUS (SEPSIS)
2000.0000 mL | Freq: Once | INTRAVENOUS | Status: AC
  Administered 2014-09-28: 2000 mL via INTRAVENOUS

## 2014-09-28 MED ORDER — ACETAMINOPHEN 650 MG RE SUPP: 650.0000 mg | Freq: Four times a day (QID) | RECTAL | Status: DC | PRN

## 2014-09-28 MED ORDER — VANCOMYCIN HCL 10 G IV SOLR
1250.0000 mg | Freq: Three times a day (TID) | INTRAVENOUS | Status: DC
  Administered 2014-09-28 – 2014-09-30 (×7): 1250 mg via INTRAVENOUS
  Filled 2014-09-28 (×11): qty 1250

## 2014-09-28 MED ORDER — DIPHENHYDRAMINE HCL 50 MG/ML IJ SOLN
25.0000 mg | Freq: Four times a day (QID) | INTRAMUSCULAR | Status: DC | PRN
  Administered 2014-09-28 – 2014-10-01 (×9): 25 mg via INTRAVENOUS
  Filled 2014-09-28 (×9): qty 1

## 2014-09-28 MED ORDER — VANCOMYCIN HCL 10 G IV SOLR
1500.0000 mg | Freq: Once | INTRAVENOUS | Status: AC
  Administered 2014-09-28: 1500 mg via INTRAVENOUS
  Filled 2014-09-28: qty 1500

## 2014-09-28 MED ORDER — HYDROMORPHONE HCL 1 MG/ML IJ SOLN
1.0000 mg | Freq: Once | INTRAMUSCULAR | Status: AC
  Administered 2014-09-28: 1 mg via INTRAVENOUS
  Filled 2014-09-28: qty 1

## 2014-09-28 MED ORDER — DIPHENHYDRAMINE HCL 50 MG/ML IJ SOLN
INTRAMUSCULAR | Status: AC
  Filled 2014-09-28: qty 1

## 2014-09-28 MED ORDER — SODIUM CHLORIDE 0.9 % IV SOLN
INTRAVENOUS | Status: DC
  Administered 2014-09-28 – 2014-09-29 (×2): via INTRAVENOUS

## 2014-09-28 MED ORDER — DIPHENHYDRAMINE HCL 50 MG/ML IJ SOLN
50.0000 mg | Freq: Once | INTRAMUSCULAR | Status: AC
  Administered 2014-09-28: 50 mg via INTRAVENOUS

## 2014-09-28 MED ORDER — PIPERACILLIN-TAZOBACTAM 3.375 G IVPB
3.3750 g | Freq: Three times a day (TID) | INTRAVENOUS | Status: DC
  Administered 2014-09-28 – 2014-10-04 (×19): 3.375 g via INTRAVENOUS
  Filled 2014-09-28 (×22): qty 50

## 2014-09-28 MED ORDER — POTASSIUM CHLORIDE CRYS ER 20 MEQ PO TBCR
40.0000 meq | EXTENDED_RELEASE_TABLET | Freq: Four times a day (QID) | ORAL | Status: AC
  Administered 2014-09-28 (×2): 40 meq via ORAL
  Filled 2014-09-28 (×2): qty 2

## 2014-09-28 MED ORDER — PIPERACILLIN-TAZOBACTAM 3.375 G IVPB 30 MIN
3.3750 g | Freq: Once | INTRAVENOUS | Status: DC
Start: 2014-09-28 — End: 2014-09-28
  Filled 2014-09-28: qty 50

## 2014-09-28 MED ORDER — ONDANSETRON HCL 4 MG PO TABS: 4.0000 mg | ORAL_TABLET | Freq: Four times a day (QID) | ORAL | Status: DC | PRN

## 2014-09-28 MED ORDER — WARFARIN - PHARMACIST DOSING INPATIENT: Freq: Every day | Status: DC

## 2014-09-28 MED ORDER — ACETAMINOPHEN 325 MG PO TABS
650.0000 mg | ORAL_TABLET | Freq: Four times a day (QID) | ORAL | Status: DC | PRN
  Administered 2014-10-03: 650 mg via ORAL
  Filled 2014-09-28: qty 2

## 2014-09-28 MED ORDER — LEVALBUTEROL HCL 0.63 MG/3ML IN NEBU: 0.6300 mg | INHALATION_SOLUTION | Freq: Four times a day (QID) | RESPIRATORY_TRACT | Status: DC | PRN

## 2014-09-28 MED ORDER — MUPIROCIN 2 % EX OINT
1.0000 "application " | TOPICAL_OINTMENT | Freq: Two times a day (BID) | CUTANEOUS | Status: AC
  Administered 2014-09-28 – 2014-10-02 (×10): 1 via NASAL
  Filled 2014-09-28 (×3): qty 22

## 2014-09-28 MED ORDER — CHLORHEXIDINE GLUCONATE CLOTH 2 % EX PADS: 6.0000 | MEDICATED_PAD | Freq: Every day | CUTANEOUS | Status: AC

## 2014-09-28 MED ORDER — METHYLPREDNISOLONE SODIUM SUCC 40 MG IJ SOLR
40.0000 mg | Freq: Once | INTRAMUSCULAR | Status: AC
  Administered 2014-09-28: 40 mg via INTRAVENOUS
  Filled 2014-09-28: qty 1

## 2014-09-28 MED ORDER — FENTANYL CITRATE 0.05 MG/ML IJ SOLN
50.0000 ug | Freq: Once | INTRAMUSCULAR | Status: AC
  Administered 2014-09-28: 50 ug via INTRAVENOUS
  Filled 2014-09-28: qty 2

## 2014-09-28 NOTE — ED Notes (Addendum)
Pt reports cellulitis to bilateral upper and lower extremities x 5 months.  States he was discharged from [facility] 2 1/2 weeks ago for same.  Reports drainage from bilateral lower legs.

## 2014-09-28 NOTE — ED Notes (Signed)
Pt reports intense itching.
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
## 2014-09-28 NOTE — Progress Notes (Signed)
ANTIBIOTIC CONSULT NOTE - INITIAL  Pharmacy Consult for Vancomycin and Zosyn  Indication: cellulitis  Allergies  Allergen Reactions  . Xarelto [Rivaroxaban] Dermatitis    Blisters skin  . Clindamycin/Lincomycin Dermatitis    Severe rash/dermatitis from November 2015 still present Jan 2016 from clinda    Patient Measurements: Height: 6\' 4"  (193 cm) Weight: 228 lb (103.42 kg) IBW/kg (Calculated) : 86.8  Vital Signs: Temp: 98.4 F (36.9 C) (03/21 0300) Temp Source: Oral (03/21 0300) BP: 118/79 mmHg (03/21 0300) Pulse Rate: 118 (03/21 0300) Intake/Output from previous day:   Intake/Output from this shift:    Labs:  Recent Labs  09/28/14 0050 09/28/14 0124  WBC 6.6  --   HGB 16.8 11.6*  PLT 197  --   CREATININE 1.23 1.20   Estimated Creatinine Clearance: 92.4 mL/min (by C-G formula based on Cr of 1.2). No results for input(s): VANCOTROUGH, VANCOPEAK, VANCORANDOM, GENTTROUGH, GENTPEAK, GENTRANDOM, TOBRATROUGH, TOBRAPEAK, TOBRARND, AMIKACINPEAK, AMIKACINTROU, AMIKACIN in the last 72 hours.   Microbiology: No results found for this or any previous visit (from the past 720 hour(s)).  Medical History: Past Medical History  Diagnosis Date  . DVT (deep venous thrombosis)     takes Xarelto daily--- in right leg x 2 and lung x 1  . Substance abuse     crack, cocaine, last use 2007  . Tobacco abuse   . DVT (deep venous thrombosis)   . Bronchitis     last time 2 yrs ago  . Heart murmur   . Shortness of breath   . Tuberculosis     ' I test Positive "  . Asthma     uses Albuterol daily as needed  . Cough     smokers  . Arthritis     knees  . Joint pain   . GERD (gastroesophageal reflux disease)     only takes something about 2 times a yr  . Cellulitis     Medications:  Prescriptions prior to admission  Medication Sig Dispense Refill Last Dose  . albuterol (PROVENTIL HFA;VENTOLIN HFA) 108 (90 BASE) MCG/ACT inhaler Inhale 2 puffs into the lungs every 6 (six)  hours as needed for wheezing or shortness of breath.   Past Month at Unknown time  . albuterol (PROVENTIL) (2.5 MG/3ML) 0.083% nebulizer solution Take 3 mLs (2.5 mg total) by nebulization every 6 (six) hours as needed for wheezing. 75 mL 11 several months  . enoxaparin (LOVENOX) 100 MG/ML injection Inject 100 mg into the skin every 12 (twelve) hours.   09/25/2014  . hydrocerin (EUCERIN) CREA Apply 1 application topically 2 (two) times daily. 228 g 0 Past Week at Unknown time  . hydrOXYzine (ATARAX/VISTARIL) 25 MG tablet Take 1 tablet (25 mg total) by mouth 3 (three) times daily as needed for itching. 30 tablet 0 Past Week at Unknown time  . warfarin (COUMADIN) 5 MG tablet Take 12.5-15 mg by mouth daily. Take 15mg  on Monday, Tuesday, Wednesday, Saturday and Sunday.  12.5mg  on Thursday and Friday.   09/27/2014 at Unknown time  . amoxicillin-clavulanate (AUGMENTIN) 875-125 MG per tablet Take 1 tablet by mouth every 12 (twelve) hours. For 14 days   07/30/2014 at Unknown time  . doxepin (SINEQUAN) 25 MG capsule Take 25 mg by mouth at bedtime as needed (sleep).   07/29/2014 at Unknown time  . oxyCODONE-acetaminophen (PERCOCET/ROXICET) 5-325 MG per tablet Take 1-2 tablets by mouth every 4 (four) hours as needed for moderate pain or severe pain.   07/30/2014 at  Unknown time  . Rivaroxaban (XARELTO) 20 MG TABS Take 1 tablet (20 mg total) by mouth daily. (Patient not taking: Reported on 09/28/2014) 30 tablet 11 07/30/2014 at Unknown time  . terbinafine (LAMISIL) 1 % cream Apply 1 application topically 2 (two) times daily.   Past Month at Unknown time   Assessment: 49 yo male with B lower extremity cellulitis, possible sepsis, for empiric antibiotics.  Vancomycin 1500 mg IV given in ED at 0130    Goal of Therapy:  Vancomycin trough level 15-20 mcg/ml  Plan:  Vancomycin 1250 g IV q8h Zosyn 3.375 g IV q8h   Danielle Mink, Bronson Curb 09/28/2014,3:55 AM

## 2014-09-28 NOTE — Progress Notes (Signed)
[facility] PROGRESS NOTE   [name] [name] FKC:[id number] DOB: 1965-10-01 DOA: 09/28/2014 PCP: No PCP Per Patient  HPI/Subjective: Feels okay, appears to be frustrated about his legs and his condition.  Assessment/Plan: Principal Problem:   Sepsis Active Problems:   Asthma, chronic   Leg DVT (deep venous thromboembolism), chronic   Cellulitis   Patient seen earlier today by my colleague Dr. Raliegh Ip. Patient seen and examined, data base reviewed, please see pictures below for exam. Chronic right lower extremity DVT and extensive eczematous rash involving both lower extremities, groin and torso. Cellulitis superimposing the eczematous rash with swelling, erythema and tenderness in both lower extremities. Pharmacy to dose the Coumadin  Code Status: Full Code Family Communication: Plan discussed with the patient. Disposition Plan: Remains inpatient Diet: Diet regular  Consultants:  None  Procedures:  None  Antibiotics:  None   Objective: Filed Vitals:   09/28/14 1148  BP: 113/50  Pulse: 101  Temp: 98 F (36.7 C)  Resp: 16    Intake/Output Summary (Last 24 hours) at 09/28/14 1338 Last data filed at 09/28/14 1319  Gross per 24 hour  Intake   1500 ml  Output    700 ml  Net    800 ml   Filed Weights   09/28/14 0003 09/28/14 0300  Weight: 103.42 kg (228 lb) 103.42 kg (228 lb)    Exam: General: Alert and awake, oriented x3, not in any acute distress. HEENT: anicteric sclera, pupils reactive to light and accommodation, EOMI CVS: S1-S2 clear, no murmur rubs or gallops Chest: clear to auscultation bilaterally, no wheezing, rales or rhonchi Abdomen: soft nontender, nondistended, normal bowel sounds, no organomegaly Extremities: Extensive eczematous rash with redness and swelling involving both lower extremities, groin and torso. Neuro: Cranial nerves II-XII intact, no focal neurological deficits           Data Reviewed: Basic Metabolic  Panel:  Recent Labs Lab 09/28/14 0050 09/28/14 0124 09/28/14 0425  NA 136 137 137  K 3.5 3.3* 3.3*  CL 101 101 106  CO2 21  --  25  GLUCOSE 111* 112* 137*  BUN 7 7 8   CREATININE 1.23 1.20 1.14  CALCIUM 8.8  --  7.6*   Liver Function Tests:  Recent Labs Lab 09/28/14 0050 09/28/14 0425  AST 16 18  ALT 19 15  ALKPHOS 55 48  BILITOT 0.7 0.9  PROT 5.7* 5.0*  ALBUMIN 3.3* 2.5*    Recent Labs Lab 09/28/14 0050  LIPASE 99*   No results for input(s): AMMONIA in the last 168 hours. CBC:  Recent Labs Lab 09/28/14 0050 09/28/14 0124 09/28/14 0425  WBC 6.6  --  6.5  NEUTROABS 4.9  --  4.0  HGB 16.8 11.6* 13.7  HCT 48.9 34.0* 40.7  MCV 93.1  --  94.7  PLT 197  --  232   Cardiac Enzymes: No results for input(s): CKTOTAL, CKMB, CKMBINDEX, TROPONINI in the last 168 hours. BNP (last 3 results) No results for input(s): BNP in the last 8760 hours.  ProBNP (last 3 results) No results for input(s): PROBNP in the last 8760 hours.  CBG: No results for input(s): GLUCAP in the last 168 hours.  Micro Recent Results (from the past 240 hour(s))  MRSA PCR Screening     Status: Abnormal   Collection Time: 09/28/14  3:17 AM  Result Value Ref Range Status   MRSA by PCR POSITIVE (A) NEGATIVE Final    Comment:        The  GeneXpert MRSA Assay (FDA approved for NASAL specimens only), is one component of a comprehensive MRSA colonization surveillance program. It is not intended to diagnose MRSA infection nor to guide or monitor treatment for MRSA infections. RESULT CALLED TO, READ BACK BY AND VERIFIED WITH: STOWE,S RN [phone] ON 09/28/14 MITCHELL,L      Studies: Dg Chest Port 1 View  09/28/2014   CLINICAL DATA:  Sepsis, dyspnea  EXAM: PORTABLE CHEST - 1 VIEW  COMPARISON:  09/29/2013  FINDINGS: A single AP portable view of the chest demonstrates no focal airspace consolidation or alveolar edema. The lungs are grossly clear. Left nipple shadow noted, similar to the 12/20/2012  exam. There is no large effusion or pneumothorax. Cardiac and mediastinal contours appear unremarkable.  IMPRESSION: No active disease.   Electronically Signed   By: Andreas Newport M.D.   On: 09/28/2014 04:14    Scheduled Meds: . Chlorhexidine Gluconate Cloth  6 each Topical Q0600  . clobetasol cream   Topical BID  . mupirocin ointment  1 application Nasal BID  . piperacillin-tazobactam (ZOSYN)  IV  3.375 g Intravenous 3 times per day  . vancomycin  1,250 mg Intravenous Q8H  . Warfarin - Pharmacist Dosing Inpatient   Does not apply q1800   Continuous Infusions: . sodium chloride 150 mL/hr at 09/28/14 0437       Time spent: 35 minutes    [facility]  [facility] Pager [phone] If 7PM-7AM, please contact night-coverage at [email or web address], password [facility] 09/28/2014, 1:38 PM  LOS: 0 days

## 2014-09-28 NOTE — Progress Notes (Signed)
ANTICOAGULATION CONSULT NOTE - Initial Consult  Pharmacy Consult for Coumadin Indication: h/o PE/DVT  Allergies  Allergen Reactions  . Xarelto [Rivaroxaban] Dermatitis    Blisters skin  . Clindamycin/Lincomycin Dermatitis    Severe rash/dermatitis from November 2015 still present Jan 2016 from clinda    Patient Measurements: Height: 6\' 4"  (193 cm) Weight: 228 lb (103.42 kg) IBW/kg (Calculated) : 86.8  Labs:  Recent Labs  09/28/14 0050 09/28/14 0124  HGB 16.8 11.6*  HCT 48.9 34.0*  PLT 197  --   LABPROT 37.6*  --   INR 3.78*  --   CREATININE 1.23 1.20    Estimated Creatinine Clearance: 92.4 mL/min (by C-G formula based on Cr of 1.2).  Medications:  Coumadin 15mg  on MTWSS 12.5mg  Thu/Fri   Assessment: 49 y.o. male admitted with cellulitis, h/o PE/DVT, to continue Coumadin  Goal of Therapy:  INR 2-3 Monitor platelets by anticoagulation protocol: Yes   Plan:  No Coumadin today F/U daily INR  Jannette Cotham, Bronson Curb 09/28/2014,4:10 AM

## 2014-09-28 NOTE — Care Management Note (Signed)
  Page 1 of 1   10/01/2014     5:36:29 PM CARE MANAGEMENT NOTE 09/28/2014  Patient:  [facility]   Account Number:  [id number]  Date Initiated:  09/28/2014  Documentation initiated by:  Elissa Hefty  Subjective/Objective Assessment:   adm w sepsis     Action/Plan:   lives w brother   Anticipated DC Date:     Anticipated DC Plan:  [place]  CM consult  [facility]      Choice offered to / List presented to:             Status of service:   Medicare Important Message given?   (If response is "NO", the following Medicare IM given date fields will be blank) Date Medicare IM given:   Medicare IM given by:   Date Additional Medicare IM given:   Additional Medicare IM given by:    Discharge Disposition:    Per UR Regulation:  Reviewed for med. necessity/level of care/duration of stay  If discussed at [place] of Stay Meetings, dates discussed:    Comments:  3/21 1458 debbie dowell rn,bsn gave pt inform on [facility]. explained hrs for [place] [facility] and pt interested in going there after disch. he's aware new pt's can go by between 9a and 4pm mon thru frid.

## 2014-09-28 NOTE — ED Notes (Signed)
Dr Claudine Mouton given a copy of lactic acid results 3.60

## 2014-09-28 NOTE — Consult Note (Signed)
WOC wound consult note Reason for Consult: diffuse rash over entire body from the neck down.  He reports started when he started taking Xarelto.  Has improved and worsened over time.  At its worst now which is reason for this admission.  He has not taken any other new meds except for Clindamycin for foot infection.  He was last seen by dermatology in [place] 2 weeks ago.  He reports at that time skin biopsy or scrapings taken, he is not aware of these results.  Significant LE edema with doppler pulses bilaterally.  I can faintly feel the right LE pulses.  He has some weeping noted on the bilateral lateral LE and posteriorly, however with the amount of erythema I do not feel comfortable wrapping him at this time and it is not clear the etiology of this rash.   Wound type: rash, presumed drug related.  Pressure Ulcer POA:No Measurement: diffuse dry, scaling patches over the entire body Wound bed: see above Drainage (amount, consistency, odor) serous weeping on the LEs, pt reports increased weeping when he is ambulatory.   Dressing procedure/placement/frequency: no topical care recommended at this time.  Would recommend complete work up by dermatology at this point.  Unclear etiology.  Due to the amount of erythema and discomfort with any palpation of the the legs I also do not feel like the patient can tolerate any topical care at this time.  May benefit from topical steroid lotions but would be decision of medical practitioner.   Discussed POC with patient and bedside nurse.  Re consult if needed, will not follow at this time. Thanks  Camyah Pultz [place], [place] [phone])

## 2014-09-28 NOTE — Progress Notes (Signed)
MRSA PCR positive. Patient placed on contact precautions and order set initiated. Primary RN notified.

## 2014-09-29 LAB — BASIC METABOLIC PANEL
ANION GAP: 8 (ref 5–15)
BUN: 9 mg/dL (ref 6–23)
CO2: 24 mmol/L (ref 19–32)
CREATININE: 1.14 mg/dL (ref 0.50–1.35)
Calcium: 8.3 mg/dL — ABNORMAL LOW (ref 8.4–10.5)
Chloride: 109 mmol/L (ref 96–112)
GFR calc Af Amer: 86 mL/min — ABNORMAL LOW (ref >90)
GFR calc non Af Amer: 74 mL/min — ABNORMAL LOW (ref >90)
Glucose, Bld: 105 mg/dL — ABNORMAL HIGH (ref 70–99)
Potassium: 4.5 mmol/L (ref 3.5–5.1)
SODIUM: 141 mmol/L (ref 135–145)

## 2014-09-29 LAB — PROTIME-INR
INR: 2.09 — ABNORMAL HIGH (ref 0.00–1.49)
Prothrombin Time: 23.7 seconds — ABNORMAL HIGH (ref 11.6–15.2)

## 2014-09-29 MED ORDER — VANCOMYCIN HCL 10 G IV SOLR: 1250.0000 mg | Freq: Three times a day (TID) | INTRAVENOUS | Status: DC

## 2014-09-29 MED ORDER — AQUAPHOR EX OINT
TOPICAL_OINTMENT | Freq: Two times a day (BID) | CUTANEOUS | Status: DC | PRN
  Filled 2014-09-29 (×4): qty 50

## 2014-09-29 MED ORDER — WARFARIN SODIUM 7.5 MG PO TABS
15.0000 mg | ORAL_TABLET | Freq: Once | ORAL | Status: AC
Start: 2014-09-29 — End: 2014-09-29
  Administered 2014-09-29: 15 mg via ORAL
  Filled 2014-09-29: qty 2

## 2014-09-29 MED ORDER — CHLORHEXIDINE GLUCONATE 4 % EX LIQD
Freq: Every day | CUTANEOUS | Status: DC | PRN
  Filled 2014-09-29: qty 15

## 2014-09-29 MED ORDER — PIPERACILLIN-TAZOBACTAM 3.375 G IVPB: 3.3750 g | Freq: Three times a day (TID) | INTRAVENOUS | Status: DC

## 2014-09-29 NOTE — Discharge Summary (Signed)
Physician Discharge Summary  [name] [name]:[id number] DOB: 31-May-1966 DOA: 09/28/2014  PCP: No PCP Per Patient  Admit date: 09/28/2014 Discharge date: 09/29/2014  Time spent: 40 minutes  Recommendations for Outpatient Follow-up:  1. Transfer to [facility] for further evaluation  Discharge Diagnoses:  Principal Problem:   Sepsis Active Problems:   Asthma, chronic   Leg DVT (deep venous thromboembolism), chronic   Cellulitis   Discharge Condition:  Stable  Diet recommendation: Heart healthy  Filed Weights   09/28/14 0003 09/28/14 0300  Weight: 103.42 kg (228 lb) 103.42 kg (228 lb)    History of present illness:  [name] [name] is a 49 y.o. male with history of DVT was on xarelto on October which was discontinued after one month after patient was found to be allergic to Xarelto and has been placed on Coumadin presents to the ER because of worsening skin rash and blistering with skin discharge of the lower extremities. Patient states that he has had skin reaction involving his lower extremity back hands over the last 5 months. He was recently diagnosed by dermatologist at [facility] of allergic reaction to Xarelto and was discontinued last month and patient was placed on Coumadin for his known DVT. Patient also was placed on clobetasol cream for his rash which patient states ran out one week ago. Patient just recently moved to [place] 3 days ago. Patient noticed increased erythema and blistering of his skin of both his lower extremities more on the left side with increasing swelling. Denies any fever or chills difficulty swallowing shortness of breath or chest pain. In the ER patient was found to be tachycardic and lactic acid was elevated. Patient was given fluid boluses and admitted for sepsis from possible developing cellulitis. Initially admitted to stepdown, stabilize overnight and transferred to regular bed today. [facility] for further evaluation for his rash.  Hospital Course:    Sepsis With respiratory rate of 26 and heart rate of 118, lactic acid was 3.6 on admission (09/28/2014 at 1:30 AM). Patient admitted to stepdown and started on sepsis protocol. Broad-spectrum antibiotics started as well as aggressive hydration with IV fluids. Sepsis physiology resolved. Lactic acid is 1.6 yesterday (09/28/2014 at 4:30 AM).  Bilateral lower extremity cellulitis Patient has diffuse rash along his both lower extremities, torso and groin. Has redness and warmth around his lower extremity rash treated as cellulitis with antibiotics. Keep legs elevated, try to decrease the swelling, stop IV fluids and Lasix if needed. Keep the legs moist, use Aquaphor cream.  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 Been recently on several to and thinking this is started the eczematous rash. Currently on Coumadin, continue dosage per pharmacy. INR is 2.09.  Eczematous rash As mentioned above patient has diffuse rash almost all over his body, please look at the pictures below. Seen in [facility] in [place] and diagnosed with allergic reaction to several toe as reported. We'll obtain records from [facility]. Discontinued steroids cream as patient has infection and this is might worsen it. Patient will probably need his steroids cream to be restarted as soon as infection controlled.   Procedures:   None  Consultations:   None  Discharge Exam: Filed Vitals:   09/29/14 1124  BP: 105/61  Pulse: 93  Temp: 97.6 F (36.4 C)  Resp: 18   General: Alert and awake, oriented x3, not in any acute distress. HEENT: anicteric sclera,  pupils reactive to light and accommodation, EOMI CVS: S1-S2 clear, no murmur rubs or gallops Chest: clear to auscultation bilaterally, no wheezing, rales or rhonchi Abdomen: soft nontender, nondistended, normal bowel sounds, no organomegaly Extremities:  Bilateral lower extremity swelling, redness, scaly rash with erythema around it. Please look at the pictures Neuro: Cranial nerves II-XII intact, no focal neurological deficits  Pictures taken on 09/28/2014           Discharge Instructions   Discharge Instructions    Diet - low sodium heart healthy    Complete by:  As directed      Increase activity slowly    Complete by:  As directed           Current Discharge Medication List    START taking these medications   Details  piperacillin-tazobactam (ZOSYN) 3.375 GM/50ML IVPB Inject 50 mLs (3.375 g total) into the vein every 8 (eight) hours. Qty: 50 mL    vancomycin 1,250 mg in sodium chloride 0.9 % 250 mL Inject 1,250 mg into the vein every 8 (eight) hours.      CONTINUE these medications which have NOT CHANGED   Details  albuterol (PROVENTIL HFA;VENTOLIN HFA) 108 (90 BASE) MCG/ACT inhaler Inhale 2 puffs into the lungs every 6 (six) hours as needed for wheezing or shortness of breath.    albuterol (PROVENTIL) (2.5 MG/3ML) 0.083% nebulizer solution Take 3 mLs (2.5 mg total) by nebulization every 6 (six) hours as needed for wheezing. Qty: 75 mL, Refills: 11    enoxaparin (LOVENOX) 100 MG/ML injection Inject 100 mg into the skin every 12 (twelve) hours.    hydrocerin (EUCERIN) CREA Apply 1 application topically 2 (two) times daily. Qty: 228 g, Refills: 0    hydrOXYzine (ATARAX/VISTARIL) 25 MG tablet Take 1 tablet (25 mg total) by mouth 3 (three) times daily as needed for itching. Qty: 30 tablet, Refills: 0    warfarin (COUMADIN) 5 MG tablet Take 12.5-15 mg by mouth daily. Take 15mg  on Monday, Tuesday, Wednesday, Saturday and Sunday.  12.5mg  on Thursday and Friday.    doxepin (SINEQUAN) 25 MG capsule Take 25 mg by mouth at bedtime as needed (sleep).    oxyCODONE-acetaminophen (PERCOCET/ROXICET) 5-325 MG per tablet Take 1-2 tablets by mouth every 4 (four) hours as needed for moderate pain or severe pain.    terbinafine  (LAMISIL) 1 % cream Apply 1 application topically 2 (two) times daily.      STOP taking these medications     amoxicillin-clavulanate (AUGMENTIN) 875-125 MG per tablet      Rivaroxaban (XARELTO) 20 MG TABS        Allergies  Allergen Reactions  . Xarelto [Rivaroxaban] Dermatitis    Blisters skin  . Clindamycin/Lincomycin Dermatitis    Severe rash/dermatitis from November 2015 still present Jan 2016 from clinda      The results of significant diagnostics from this hospitalization (including imaging, microbiology, ancillary and laboratory) are listed below for reference.    Significant Diagnostic Studies: Dg Chest Port 1 View  09/28/2014   CLINICAL DATA:  Sepsis, dyspnea  EXAM: PORTABLE CHEST - 1 VIEW  COMPARISON:  09/29/2013  FINDINGS: A single AP portable view of the chest demonstrates no focal airspace consolidation or alveolar edema. The lungs are grossly clear. Left nipple shadow noted, similar to the 12/20/2012 exam. There is no large effusion or pneumothorax. Cardiac and mediastinal contours appear unremarkable.  IMPRESSION: No active disease.   Electronically Signed   By: Valerie Roys.D.  On: 09/28/2014 04:14    Microbiology: Recent Results (from the past 240 hour(s))  Culture, blood (routine x 2)     Status: None (Preliminary result)   Collection Time: 09/28/14 12:50 AM  Result Value Ref Range Status   Specimen Description BLOOD RIGHT FOREARM  Final   Special Requests BOTTLES DRAWN AEROBIC AND ANAEROBIC 5CC   Final   Culture   Final           BLOOD CULTURE RECEIVED NO GROWTH TO DATE CULTURE WILL BE HELD FOR 5 DAYS BEFORE ISSUING A FINAL NEGATIVE REPORT Performed at [facility]    Report Status PENDING  Incomplete  Culture, blood (routine x 2)     Status: None (Preliminary result)   Collection Time: 09/28/14  1:27 AM  Result Value Ref Range Status   Specimen Description BLOOD LEFT ARM  Final   Special Requests BOTTLES DRAWN AEROBIC AND ANAEROBIC 5CC   Final   Culture   Final           BLOOD CULTURE RECEIVED NO GROWTH TO DATE CULTURE WILL BE HELD FOR 5 DAYS BEFORE ISSUING A FINAL NEGATIVE REPORT Performed at [facility]    Report Status PENDING  Incomplete  MRSA PCR Screening     Status: Abnormal   Collection Time: 09/28/14  3:17 AM  Result Value Ref Range Status   MRSA by PCR POSITIVE (A) NEGATIVE Final    Comment:        The GeneXpert MRSA Assay (FDA approved for NASAL specimens only), is one component of a comprehensive MRSA colonization surveillance program. It is not intended to diagnose MRSA infection nor to guide or monitor treatment for MRSA infections. RESULT CALLED TO, READ BACK BY AND VERIFIED WITH: STOWE,S RN [phone] ON 09/28/14 MITCHELL,L      Labs: Basic Metabolic Panel:  Recent Labs Lab 09/28/14 0050 09/28/14 0124 09/28/14 0425 09/29/14 0235  NA 136 137 137 141  K 3.5 3.3* 3.3* 4.5  CL 101 101 106 109  CO2 21  --  25 24  GLUCOSE 111* 112* 137* 105*  BUN 7 7 8 9   CREATININE 1.23 1.20 1.14 1.14  CALCIUM 8.8  --  7.6* 8.3*   Liver Function Tests:  Recent Labs Lab 09/28/14 0050 09/28/14 0425  AST 16 18  ALT 19 15  ALKPHOS 55 48  BILITOT 0.7 0.9  PROT 5.7* 5.0*  ALBUMIN 3.3* 2.5*    Recent Labs Lab 09/28/14 0050  LIPASE 99*   No results for input(s): AMMONIA in the last 168 hours. CBC:  Recent Labs Lab 09/28/14 0050 09/28/14 0124 09/28/14 0425  WBC 6.6  --  6.5  NEUTROABS 4.9  --  4.0  HGB 16.8 11.6* 13.7  HCT 48.9 34.0* 40.7  MCV 93.1  --  94.7  PLT 197  --  232   Cardiac Enzymes: No results for input(s): CKTOTAL, CKMB, CKMBINDEX, TROPONINI in the last 168 hours. BNP: BNP (last 3 results) No results for input(s): BNP in the last 8760 hours.  ProBNP (last 3 results) No results for input(s): PROBNP in the last 8760 hours.  CBG: No results for input(s): GLUCAP in the last 168 hours.     Signed:  Caris Cerveny A  [facility] 09/29/2014, 3:24  PM

## 2014-09-29 NOTE — Progress Notes (Signed)
Received pt.from 2H ,in bed AAOX 4,Oriented pt.to room ,& call bell.Will continue to monitor pt.

## 2014-09-29 NOTE — Progress Notes (Signed)
[facility] PROGRESS NOTE   [name] [name] NIO:[id number] DOB: 12/08/1965 DOA: 09/28/2014 PCP: No PCP Per Patient  HPI/Subjective: Feels his leg got worse since yesterday, patient is oozing fluids.  Assessment/Plan: Principal Problem:   Sepsis Active Problems:   Asthma, chronic   Leg DVT (deep venous thromboembolism), chronic   Cellulitis   Sepsis With respiratory rate of 26 and heart rate of 118, lactic acid was 3.6 on admission. Patient admitted to stepdown and started on sepsis protocol. Broad-spectrum antibiotics started as well as aggressive hydration with IV fluids. Sepsis physiology resolved.  Bilateral lower extremity cellulitis Patient has diffuse rash along his both lower extremities, torso and groin. Has extensive lower extremity cellulitis, started on broad-spectrum antibiotics. Keep legs elevated, try to decrease the swelling, stop IV fluids and Lasix if needed. Keep the legs moist, use Aquaphor cream.  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 Been recently on several to and thinking this is started the eczematous rash. Currently on Coumadin, continue dosage per pharmacy.  Eczematous rash As mentioned above patient has diffuse rash almost all over his body, please look at the pictures below. Seen in [facility] in [place] and diagnosed with allergic reaction to several toe as reported. We'll obtain records from [facility]. Discontinued steroids cream as patient has infection and this is might worsen it. Patient will probably need his steroids cream to be restarted as soon as infection controlled.    Code Status: Full Code Family Communication: Plan discussed with the patient. Disposition Plan: Remains inpatient Diet: Diet regular  Consultants:  None  Procedures:  None  Antibiotics:  None   Objective: Filed Vitals:   09/29/14 1124  BP: 105/61  Pulse: 93  Temp: 97.6 F (36.4 C)  Resp: 18    Intake/Output  Summary (Last 24 hours) at 09/29/14 1348 Last data filed at 09/29/14 1123  Gross per 24 hour  Intake   3230 ml  Output   2525 ml  Net    705 ml   Filed Weights   09/28/14 0003 09/28/14 0300  Weight: 103.42 kg (228 lb) 103.42 kg (228 lb)    Exam: General: Alert and awake, oriented x3, not in any acute distress. HEENT: anicteric sclera, pupils reactive to light and accommodation, EOMI CVS: S1-S2 clear, no murmur rubs or gallops Chest: clear to auscultation bilaterally, no wheezing, rales or rhonchi Abdomen: soft nontender, nondistended, normal bowel sounds, no organomegaly Extremities: Extensive eczematous rash with redness and swelling involving both lower extremities, groin and torso. Neuro: Cranial nerves II-XII intact, no focal neurological deficits           Data Reviewed: Basic Metabolic Panel:  Recent Labs Lab 09/28/14 0050 09/28/14 0124 09/28/14 0425 09/29/14 0235  NA 136 137 137 141  K 3.5 3.3* 3.3* 4.5  CL 101 101 106 109  CO2 21  --  25 24  GLUCOSE 111* 112* 137* 105*  BUN 7 7 8 9   CREATININE 1.23 1.20 1.14 1.14  CALCIUM 8.8  --  7.6* 8.3*   Liver Function Tests:  Recent Labs Lab 09/28/14 0050 09/28/14 0425  AST 16 18  ALT 19 15  ALKPHOS 55 48  BILITOT 0.7 0.9  PROT 5.7* 5.0*  ALBUMIN 3.3* 2.5*    Recent Labs Lab 09/28/14 0050  LIPASE 99*   No results for input(s): AMMONIA in the last 168 hours. CBC:  Recent Labs Lab 09/28/14 0050 09/28/14 0124 09/28/14 0425  WBC 6.6  --  6.5  NEUTROABS 4.9  --  4.0  HGB 16.8 11.6* 13.7  HCT 48.9 34.0* 40.7  MCV 93.1  --  94.7  PLT 197  --  232   Cardiac Enzymes: No results for input(s): CKTOTAL, CKMB, CKMBINDEX, TROPONINI in the last 168 hours. BNP (last 3 results) No results for input(s): BNP in the last 8760 hours.  ProBNP (last 3 results) No results for input(s): PROBNP in the last 8760 hours.  CBG: No results for input(s): GLUCAP in the last 168 hours.  Micro Recent Results  (from the past 240 hour(s))  Culture, blood (routine x 2)     Status: None (Preliminary result)   Collection Time: 09/28/14 12:50 AM  Result Value Ref Range Status   Specimen Description BLOOD RIGHT FOREARM  Final   Special Requests BOTTLES DRAWN AEROBIC AND ANAEROBIC 5CC   Final   Culture   Final           BLOOD CULTURE RECEIVED NO GROWTH TO DATE CULTURE WILL BE HELD FOR 5 DAYS BEFORE ISSUING A FINAL NEGATIVE REPORT Performed at [facility]    Report Status PENDING  Incomplete  Culture, blood (routine x 2)     Status: None (Preliminary result)   Collection Time: 09/28/14  1:27 AM  Result Value Ref Range Status   Specimen Description BLOOD LEFT ARM  Final   Special Requests BOTTLES DRAWN AEROBIC AND ANAEROBIC 5CC  Final   Culture   Final           BLOOD CULTURE RECEIVED NO GROWTH TO DATE CULTURE WILL BE HELD FOR 5 DAYS BEFORE ISSUING A FINAL NEGATIVE REPORT Performed at [facility]    Report Status PENDING  Incomplete  MRSA PCR Screening     Status: Abnormal   Collection Time: 09/28/14  3:17 AM  Result Value Ref Range Status   MRSA by PCR POSITIVE (A) NEGATIVE Final    Comment:        The GeneXpert MRSA Assay (FDA approved for NASAL specimens only), is one component of a comprehensive MRSA colonization surveillance program. It is not intended to diagnose MRSA infection nor to guide or monitor treatment for MRSA infections. RESULT CALLED TO, READ BACK BY AND VERIFIED WITH: STOWE,S RN [phone] ON 09/28/14 MITCHELL,L      Studies: Dg Chest Port 1 View  09/28/2014   CLINICAL DATA:  Sepsis, dyspnea  EXAM: PORTABLE CHEST - 1 VIEW  COMPARISON:  09/29/2013  FINDINGS: A single AP portable view of the chest demonstrates no focal airspace consolidation or alveolar edema. The lungs are grossly clear. Left nipple shadow noted, similar to the 12/20/2012 exam. There is no large effusion or pneumothorax. Cardiac and mediastinal contours appear unremarkable.  IMPRESSION: No  active disease.   Electronically Signed   By: Andreas Newport M.D.   On: 09/28/2014 04:14    Scheduled Meds: . Chlorhexidine Gluconate Cloth  6 each Topical Q0600  . mupirocin ointment  1 application Nasal BID  . piperacillin-tazobactam (ZOSYN)  IV  3.375 g Intravenous 3 times per day  . vancomycin  1,250 mg Intravenous Q8H  . warfarin  15 mg Oral ONCE-1800  . Warfarin - Pharmacist Dosing Inpatient   Does not apply q1800   Continuous Infusions:       Time spent: 35 minutes    [facility] A  [facility] Pager [phone] If 7PM-7AM, please contact night-coverage at [email or web address], password [facility] 09/29/2014, 1:48 PM  LOS: 1 day

## 2014-09-29 NOTE — Significant Event (Signed)
Received another call from [place] access line. The transfer cancelled after the demographics faxed, transfer cancelled because lack of insurance. Will continue current management.  Birdie Hopes Pager: [phone] 09/29/2014, 5:11 PM

## 2014-09-29 NOTE — Progress Notes (Signed)
[place] for Coumadin Indication: h/o PE/DVT  Allergies  Allergen Reactions  . Xarelto [Rivaroxaban] Dermatitis    Blisters skin  . Clindamycin/Lincomycin Dermatitis    Severe rash/dermatitis from November 2015 still present Jan 2016 from clinda    Patient Measurements: Height: 6\' 4"  (193 cm) Weight: 228 lb (103.42 kg) IBW/kg (Calculated) : 86.8  Labs:  Recent Labs  09/28/14 0050 09/28/14 0124 09/28/14 0425 09/29/14 0235  HGB 16.8 11.6* 13.7  --   HCT 48.9 34.0* 40.7  --   PLT 197  --  232  --   APTT  --   --  64*  --   LABPROT 37.6*  --   --  23.7*  INR 3.78*  --   --  2.09*  CREATININE 1.23 1.20 1.14 1.14    Estimated Creatinine Clearance: 97.3 mL/min (by C-G formula based on Cr of 1.14).  Medications:  Coumadin 15mg  on MTWSS 12.5mg  Thu/Fri   Assessment: 49 y.o. male admitted with cellulitis, h/o PE/DVT, to continue Coumadin. INR was supratherapeutic on admit at 3.78, now down to 2.09 this morning after dose held last night. Patient did report taking on 3/20. No bleeding issues noted.  Renal function remains stable, continue antibiotics for possible sepsis.  Goal of Therapy:  INR 2-3 Monitor platelets by anticoagulation protocol: Yes   Plan:  Warfarin 15mg  tonight F/U daily INR  Erin Hearing PharmD., BCPS Clinical Pharmacist Pager [phone] 09/29/2014 9:19 AM

## 2014-09-30 DIAGNOSIS — J4520 Mild intermittent asthma, uncomplicated: Secondary | ICD-10-CM

## 2014-09-30 DIAGNOSIS — I82501 Chronic embolism and thrombosis of unspecified deep veins of right lower extremity: Secondary | ICD-10-CM

## 2014-09-30 LAB — RAPID URINE DRUG SCREEN, HOSP PERFORMED
Amphetamines: NOT DETECTED
BARBITURATES: NOT DETECTED
Benzodiazepines: NOT DETECTED
COCAINE: NOT DETECTED
Opiates: POSITIVE — AB
Tetrahydrocannabinol: NOT DETECTED

## 2014-09-30 LAB — PROTIME-INR
INR: 1.41 (ref 0.00–1.49)
Prothrombin Time: 17.4 seconds — ABNORMAL HIGH (ref 11.6–15.2)

## 2014-09-30 LAB — VANCOMYCIN, TROUGH: VANCOMYCIN TR: 21 ug/mL — AB (ref 10.0–20.0)

## 2014-09-30 MED ORDER — WARFARIN SODIUM 7.5 MG PO TABS
15.0000 mg | ORAL_TABLET | Freq: Once | ORAL | Status: AC
  Administered 2014-09-30: 15 mg via ORAL
  Filled 2014-09-30: qty 2

## 2014-09-30 MED ORDER — VANCOMYCIN HCL 10 G IV SOLR
1500.0000 mg | Freq: Two times a day (BID) | INTRAVENOUS | Status: DC
  Administered 2014-09-30 – 2014-10-04 (×8): 1500 mg via INTRAVENOUS
  Filled 2014-09-30 (×9): qty 1500

## 2014-09-30 MED ORDER — CAMPHOR-MENTHOL 0.5-0.5 % EX LOTN
TOPICAL_LOTION | CUTANEOUS | Status: DC | PRN
  Filled 2014-09-30 (×2): qty 222

## 2014-09-30 NOTE — Progress Notes (Signed)
09/30/2014 1200 NCM spoke to pt and states he was going to [facility]. Reviewed and  last appt was in 07/2013. Pt states he is agreeable to follow up at [facility]. Pt states he currently not working. He applied for disability and Medicaid while he was living in [place]. Pt states he has moved back to [place]. Jonnie Finner RN CCM Case Mgmt phone [phone]

## 2014-09-30 NOTE — Progress Notes (Addendum)
[place] for Coumadin Indication: h/o PE/DVT  Allergies  Allergen Reactions  . Xarelto [Rivaroxaban] Dermatitis    Blisters skin  . Clindamycin/Lincomycin Dermatitis    Severe rash/dermatitis from November 2015 still present Jan 2016 from clinda    Patient Measurements: Height: 6\' 4"  (193 cm) Weight: 239 lb 6.7 oz (108.6 kg) IBW/kg (Calculated) : 86.8  Labs:  Recent Labs  09/28/14 0050 09/28/14 0124 09/28/14 0425 09/29/14 0235 09/30/14 0736  HGB 16.8 11.6* 13.7  --   --   HCT 48.9 34.0* 40.7  --   --   PLT 197  --  232  --   --   APTT  --   --  64*  --   --   LABPROT 37.6*  --   --  23.7* 17.4*  INR 3.78*  --   --  2.09* 1.41  CREATININE 1.23 1.20 1.14 1.14  --     Estimated Creatinine Clearance: 107 mL/min (by C-G formula based on Cr of 1.14).  Medications:  Coumadin 15mg  on MTWSS 12.5mg  Thu/Fri   Assessment: 49 y.o. male admitted with cellulitis, h/o PE/DVT, to continue Coumadin. INR was supratherapeutic on admit at 3.78 but continues to fall rapidly and is now low at 1.41. No new CBC today. No bleeding noted.  Goal of Therapy:  INR 2-3   Plan:  - Warfarin 15mg  PO x 1 at noon - Daily INR  Salome Arnt, PharmD, BCPS Pager # [phone] 09/30/2014 10:28 AM  Addendum: A vancomycin trough was checked today and it was slightly above goal at 21.   Plan: - Reduce vancomycin to 1500mg  IV Q12H - F/u renal fxn, C&S, clinical status and trough at [facility], PharmD, BCPS Pager # [phone] 09/30/2014 2:21 PM

## 2014-09-30 NOTE — Progress Notes (Signed)
[facility] PROGRESS NOTE  [name] [name] YFV:[id number] DOB: [birth year] DOA: 09/28/2014 PCP: No PCP Per Patient  Assessment/Plan:  Sepsis With respiratory rate of 26 and heart rate of 118, lactic acid was 3.6 on admission (09/28/2014 at 1:30 AM). Patient admitted to stepdown and started on sepsis protocol. Broad-spectrum antibiotics started as well as aggressive hydration with IV fluids. Sepsis physiology resolved. Lactic acid is 1.6 yesterday (09/28/2014 at 4:30 AM). Continue with vancomycin and Zosyn  Bilateral lower extremity cellulitis Patient has diffuse rash along his both lower extremities, torso and groin. Has redness and warmth around his lower extremity rash treated as cellulitis with antibiotics. Keep legs elevated, try to decrease the swelling, stop IV fluids and Lasix if needed. Keep the legs moist, use Aquaphor cream.  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 Been recently on several to and thinking this is started the eczematous rash. Currently on Coumadin, continue dosage per pharmacy. INR is 2.09.  Eczematous rash As mentioned above patient has diffuse rash almost all over his body, please look at the pictures below. Seen in [facility] in [place] and diagnosed with allergic reaction to several toe as reported. We'll obtain records from [facility]. Discontinued steroids cream as patient has infection and this is might worsen it. Patient will probably need his steroids cream to be restarted as soon as infection controlled.   Code Status: Full code Family Communication: No family at bedside Disposition Plan: Home when stable   Consultants:  None  Procedures:  None  Antibiotics:  Vancomycin   Zosyn  HPI/Subjective: 49 y.o. male with history of DVT was on xarelto on October which was discontinued after one month after patient was found to be allergic to Xarelto and has been placed on Coumadin presents to the ER because of  worsening skin rash and blistering with skin discharge of the lower extremities. Patient states that he has had skin reaction involving his lower extremity back hands over the last 5 months. He was recently diagnosed by dermatologist at [facility] of allergic reaction to Xarelto and was discontinued last month and patient was placed on Coumadin for his known DVT. Patient also was placed on clobetasol cream for his rash which patient states ran out one week ago. Patient just recently moved to [place] 3 days ago. Patient noticed increased erythema and blistering of his skin of both his lower extremities more on the left side with increasing swelling. Denies any fever or chills difficulty swallowing shortness of breath or chest pain. In the ER patient was found to be tachycardic and lactic acid was elevated. Patient was given fluid boluses and admitted for sepsis from possible developing cellulitis. Patient was to be transferred to [facility] which was canceled for lack of insurance.  This morning patient denies any new complaints, still has swelling of the lower extremities and redness is improving.  Objective: Filed Vitals:   09/30/14 1347  BP: 121/64  Pulse:   Temp: 97.9 F (36.6 C)  Resp: 17    Intake/Output Summary (Last 24 hours) at 09/30/14 1655 Last data filed at 09/30/14 1335  Gross per 24 hour  Intake    600 ml  Output      0 ml  Net    600 ml   Filed Weights   09/28/14 0300 09/29/14 2048 09/30/14 0510  Weight: 103.42 kg (228 lb) 108.6 kg (239 lb 6.7 oz) 108.6 kg (239 lb 6.7 oz)    Exam:   General:  Appears in no acute distress  Cardiovascular: S1-S2 regular  Respiratory: Clear to auscultation bilaterally  Abdomen: Soft, nontender no organomegaly  Musculoskeletal: Extensive eczema noted on the lower extremities, with peeling of skin, erythema and swelling noted in both lower extremities  Data Reviewed: Basic Metabolic Panel:  Recent Labs Lab  09/28/14 0050 09/28/14 0124 09/28/14 0425 09/29/14 0235  NA 136 137 137 141  K 3.5 3.3* 3.3* 4.5  CL 101 101 106 109  CO2 21  --  25 24  GLUCOSE 111* 112* 137* 105*  BUN 7 7 8 9   CREATININE 1.23 1.20 1.14 1.14  CALCIUM 8.8  --  7.6* 8.3*   Liver Function Tests:  Recent Labs Lab 09/28/14 0050 09/28/14 0425  AST 16 18  ALT 19 15  ALKPHOS 55 48  BILITOT 0.7 0.9  PROT 5.7* 5.0*  ALBUMIN 3.3* 2.5*    Recent Labs Lab 09/28/14 0050  LIPASE 99*   No results for input(s): AMMONIA in the last 168 hours. CBC:  Recent Labs Lab 09/28/14 0050 09/28/14 0124 09/28/14 0425  WBC 6.6  --  6.5  NEUTROABS 4.9  --  4.0  HGB 16.8 11.6* 13.7  HCT 48.9 34.0* 40.7  MCV 93.1  --  94.7  PLT 197  --  232   Cardiac Enzymes: No results for input(s): CKTOTAL, CKMB, CKMBINDEX, TROPONINI in the last 168 hours. BNP (last 3 results) No results for input(s): BNP in the last 8760 hours.  ProBNP (last 3 results) No results for input(s): PROBNP in the last 8760 hours.  CBG: No results for input(s): GLUCAP in the last 168 hours.  Recent Results (from the past 240 hour(s))  Culture, blood (routine x 2)     Status: None (Preliminary result)   Collection Time: 09/28/14 12:50 AM  Result Value Ref Range Status   Specimen Description BLOOD RIGHT FOREARM  Final   Special Requests BOTTLES DRAWN AEROBIC AND ANAEROBIC 5CC   Final   Culture   Final           BLOOD CULTURE RECEIVED NO GROWTH TO DATE CULTURE WILL BE HELD FOR 5 DAYS BEFORE ISSUING A FINAL NEGATIVE REPORT Performed at [facility]    Report Status PENDING  Incomplete  Culture, blood (routine x 2)     Status: None (Preliminary result)   Collection Time: 09/28/14  1:27 AM  Result Value Ref Range Status   Specimen Description BLOOD LEFT ARM  Final   Special Requests BOTTLES DRAWN AEROBIC AND ANAEROBIC 5CC  Final   Culture   Final           BLOOD CULTURE RECEIVED NO GROWTH TO DATE CULTURE WILL BE HELD FOR 5 DAYS BEFORE  ISSUING A FINAL NEGATIVE REPORT Performed at [facility]    Report Status PENDING  Incomplete  MRSA PCR Screening     Status: Abnormal   Collection Time: 09/28/14  3:17 AM  Result Value Ref Range Status   MRSA by PCR POSITIVE (A) NEGATIVE Final    Comment:        The GeneXpert MRSA Assay (FDA approved for NASAL specimens only), is one component of a comprehensive MRSA colonization surveillance program. It is not intended to diagnose MRSA infection nor to guide or monitor treatment for MRSA infections. RESULT CALLED TO, READ BACK BY AND VERIFIED WITH: STOWE,S RN [phone] ON 09/28/14 MITCHELL,L      Studies: No results found.  Scheduled Meds: . Chlorhexidine Gluconate Cloth  6 each  Topical Q0600  . mupirocin ointment  1 application Nasal BID  . piperacillin-tazobactam (ZOSYN)  IV  3.375 g Intravenous 3 times per day  . vancomycin  1,500 mg Intravenous Q12H  . Warfarin - Pharmacist Dosing Inpatient   Does not apply q1800   Continuous Infusions:   Principal Problem:   Sepsis Active Problems:   Asthma, chronic   Leg DVT (deep venous thromboembolism), chronic   Cellulitis    Time spent: 25 min    [facility] Pager [phone]. If 7PM-7AM, please contact night-coverage at [email or web address], password [facility] 09/30/2014, 4:55 PM  LOS: 2 days

## 2014-10-01 LAB — PROTIME-INR
INR: 1.62 — AB (ref 0.00–1.49)
Prothrombin Time: 19.4 seconds — ABNORMAL HIGH (ref 11.6–15.2)

## 2014-10-01 MED ORDER — WARFARIN SODIUM 7.5 MG PO TABS
15.0000 mg | ORAL_TABLET | Freq: Once | ORAL | Status: AC
  Administered 2014-10-01: 15 mg via ORAL
  Filled 2014-10-01: qty 2

## 2014-10-01 MED ORDER — HYDROXYZINE HCL 25 MG PO TABS
25.0000 mg | ORAL_TABLET | Freq: Three times a day (TID) | ORAL | Status: DC | PRN
  Administered 2014-10-01 – 2014-10-04 (×9): 25 mg via ORAL
  Filled 2014-10-01 (×10): qty 1

## 2014-10-01 MED ORDER — ENSURE PUDDING PO PUDG
1.0000 | Freq: Three times a day (TID) | ORAL | Status: DC
Start: 2014-10-01 — End: 2014-10-04
  Administered 2014-10-01 – 2014-10-04 (×10): 1 via ORAL

## 2014-10-01 MED ORDER — FUROSEMIDE 10 MG/ML IJ SOLN
20.0000 mg | Freq: Once | INTRAMUSCULAR | Status: AC
  Administered 2014-10-01: 20 mg via INTRAVENOUS
  Filled 2014-10-01: qty 2

## 2014-10-01 NOTE — Progress Notes (Signed)
[facility] PROGRESS NOTE  [name] [name] RCV:[id number] DOB: [birth year] DOA: 09/28/2014 PCP: No PCP Per Patient  Assessment/Plan:  Sepsis With respiratory rate of 26 and heart rate of 118, lactic acid was 3.6 on admission (09/28/2014 at 1:30 AM). Patient admitted to stepdown and started on sepsis protocol. Broad-spectrum antibiotics started as well as aggressive hydration with IV fluids. Sepsis physiology resolved. Lactic acid was 1.6 yesterday (09/28/2014 at 4:30 AM). Continue with vancomycin and Zosyn. Improved.  Bilateral lower extremity cellulitis Patient has diffuse rash along his both lower extremities, torso and groin. Has redness and warmth around his lower extremity rash treated as cellulitis with antibiotics. Keep legs elevated, try to decrease the swelling, stop IV fluids and Lasix if needed. Keep the legs moist, use Aquaphor cream.  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 Been recently on several to and thinking this is started the eczematous rash. Currently on Coumadin, continue dosage per pharmacy. INR is 1.62  Eczematous rash As mentioned above patient has diffuse rash almost all over his body, please look at the pictures below. Seen in [name] [name] in [place] and diagnosed with allergic reaction to several toe as reported. We'll obtain records from [facility]. Discontinued steroids cream as patient has infection and this is might worsen it. Patient will probably need his steroids cream to be restarted as soon as infection controlled.   Code Status: Full code Family Communication: No family at bedside Disposition Plan: Home when stable   Consultants:  None  Procedures:  None  Antibiotics:  Vancomycin   Zosyn  HPI/Subjective: 49 y.o. male with history of DVT was on xarelto on October which was discontinued after one month after patient was found  to be allergic to Xarelto and has been placed on Coumadin presents to the ER because of worsening skin rash and blistering with skin discharge of the lower extremities. Patient states that he has had skin reaction involving his lower extremity back hands over the last 5 months. He was recently diagnosed by dermatologist at [facility] of allergic reaction to Xarelto and was discontinued last month and patient was placed on Coumadin for his known DVT. Patient also was placed on clobetasol cream for his rash which patient states ran out one week ago. Patient just recently moved to [place] 3 days ago. Patient noticed increased erythema and blistering of his skin of both his lower extremities more on the left side with increasing swelling. Denies any fever or chills difficulty swallowing shortness of breath or chest pain. In the ER patient was found to be tachycardic and lactic acid was elevated. Patient was given fluid boluses and admitted for sepsis from possible developing cellulitis. Patient was to be transferred to [facility] which was canceled for lack of insurance.  Complains of itching this morning, also says the legs are more swollen  Objective: Filed Vitals:   10/01/14 0557  BP: 105/57  Pulse: 81  Temp: 98.1 F (36.7 C)  Resp: 15    Intake/Output Summary (Last 24 hours) at 10/01/14 0755 Last data filed at 10/01/14 1025  Gross per 24 hour  Intake   2239 ml  Output      0 ml  Net   2239 ml   Filed Weights   09/28/14 0300 09/29/14 2048 09/30/14 0510  Weight: 103.42 kg (228 lb) 108.6 kg (239 lb 6.7 oz) 108.6 kg (239 lb  6.7 oz)    Exam:   General:  Appears in no acute distress  Cardiovascular: S1-S2 regular  Respiratory: Clear to auscultation bilaterally  Abdomen: Soft, nontender no organomegaly  Skin- Dry skin, eczema noted in the chest  Musculoskeletal: Extensive eczema noted on the lower extremities, with peeling of skin, erythema and swelling noted in both  lower extremities  Data Reviewed: Basic Metabolic Panel:  Recent Labs Lab 09/28/14 0050 09/28/14 0124 09/28/14 0425 09/29/14 0235  NA 136 137 137 141  K 3.5 3.3* 3.3* 4.5  CL 101 101 106 109  CO2 21  --  25 24  GLUCOSE 111* 112* 137* 105*  BUN 7 7 8 9   CREATININE 1.23 1.20 1.14 1.14  CALCIUM 8.8  --  7.6* 8.3*   Liver Function Tests:  Recent Labs Lab 09/28/14 0050 09/28/14 0425  AST 16 18  ALT 19 15  ALKPHOS 55 48  BILITOT 0.7 0.9  PROT 5.7* 5.0*  ALBUMIN 3.3* 2.5*    Recent Labs Lab 09/28/14 0050  LIPASE 99*   No results for input(s): AMMONIA in the last 168 hours. CBC:  Recent Labs Lab 09/28/14 0050 09/28/14 0124 09/28/14 0425  WBC 6.6  --  6.5  NEUTROABS 4.9  --  4.0  HGB 16.8 11.6* 13.7  HCT 48.9 34.0* 40.7  MCV 93.1  --  94.7  PLT 197  --  232   Cardiac Enzymes: No results for input(s): CKTOTAL, CKMB, CKMBINDEX, TROPONINI in the last 168 hours. BNP (last 3 results) No results for input(s): BNP in the last 8760 hours.  ProBNP (last 3 results) No results for input(s): PROBNP in the last 8760 hours.  CBG: No results for input(s): GLUCAP in the last 168 hours.  Recent Results (from the past 240 hour(s))  Culture, blood (routine x 2)     Status: None (Preliminary result)   Collection Time: 09/28/14 12:50 AM  Result Value Ref Range Status   Specimen Description BLOOD RIGHT FOREARM  Final   Special Requests BOTTLES DRAWN AEROBIC AND ANAEROBIC 5CC   Final   Culture   Final           BLOOD CULTURE RECEIVED NO GROWTH TO DATE CULTURE WILL BE HELD FOR 5 DAYS BEFORE ISSUING A FINAL NEGATIVE REPORT Performed at [facility]    Report Status PENDING  Incomplete  Culture, blood (routine x 2)     Status: None (Preliminary result)   Collection Time: 09/28/14  1:27 AM  Result Value Ref Range Status   Specimen Description BLOOD LEFT ARM  Final   Special Requests BOTTLES DRAWN AEROBIC AND ANAEROBIC 5CC  Final   Culture   Final           BLOOD  CULTURE RECEIVED NO GROWTH TO DATE CULTURE WILL BE HELD FOR 5 DAYS BEFORE ISSUING A FINAL NEGATIVE REPORT Performed at [facility]    Report Status PENDING  Incomplete  MRSA PCR Screening     Status: Abnormal   Collection Time: 09/28/14  3:17 AM  Result Value Ref Range Status   MRSA by PCR POSITIVE (A) NEGATIVE Final    Comment:        The GeneXpert MRSA Assay (FDA approved for NASAL specimens only), is one component of a comprehensive MRSA colonization surveillance program. It is not intended to diagnose MRSA infection nor to guide or monitor treatment for MRSA infections. RESULT CALLED TO, READ BACK BY AND VERIFIED WITH: STOWE,S RN [phone] ON 09/28/14 MITCHELL,L  Studies: No results found.  Scheduled Meds: . Chlorhexidine Gluconate Cloth  6 each Topical Q0600  . feeding supplement (ENSURE)  1 Container Oral TID BM  . furosemide  20 mg Intravenous Once  . mupirocin ointment  1 application Nasal BID  . piperacillin-tazobactam (ZOSYN)  IV  3.375 g Intravenous 3 times per day  . vancomycin  1,500 mg Intravenous Q12H  . Warfarin - Pharmacist Dosing Inpatient   Does not apply q1800   Continuous Infusions:   Principal Problem:   Sepsis Active Problems:   Asthma, chronic   Leg DVT (deep venous thromboembolism), chronic   Cellulitis    Time spent: 25 min    [facility] Pager [phone]. If 7PM-7AM, please contact night-coverage at [email or web address], password [name] 10/01/2014, 7:55 AM  LOS: 3 days

## 2014-10-01 NOTE — Care Management Note (Signed)
  Page 1 of 1   10/01/2014     5:36:35 PM CARE MANAGEMENT NOTE 10/01/2014  Patient:  [facility] A   Account Number:  [id number]  Date Initiated:  09/30/2014  Documentation initiated by:  [facility]  Subjective/Objective Assessment:   cellulitis     Action/Plan:   lives with family   Anticipated DC Date:  10/02/2014   Anticipated DC Plan:  [place]  CM consult  Medication Assistance      Choice offered to / List presented to:             Status of service:  In process, will continue to follow Medicare Important Message given?  NO (If response is "NO", the following Medicare IM given date fields will be blank) Date Medicare IM given:   Medicare IM given by:   Date Additional Medicare IM given:   Additional Medicare IM given by:    Discharge Disposition:    Per UR Regulation:    If discussed at Long Length of Stay Meetings, dates discussed:    Comments:  09/30/2014 1200 NCM spoke to pt and states he was going to [facility]. Reviewed and  last appt was in 07/2013. Pt states he is agreeable to follow up at [facility]. Pt states he currently not working. He applied for disability and Medicaid while he was living in [place]. Pt states he has moved back to [place]. Jonnie Finner RN CCM Case Mgmt phone [phone]

## 2014-10-01 NOTE — Progress Notes (Signed)
[facility] for Coumadin Indication: h/o PE/DVT  Allergies  Allergen Reactions  . Xarelto [Rivaroxaban] Dermatitis    Blisters skin  . Clindamycin/Lincomycin Dermatitis    Severe rash/dermatitis from November 2015 still present Jan 2016 from clinda    Patient Measurements: Height: 6\' 4"  (193 cm) Weight: 239 lb 6.7 oz (108.6 kg) IBW/kg (Calculated) : 86.8  Labs:  Recent Labs  09/29/14 0235 09/30/14 0736 10/01/14 0703  LABPROT 23.7* 17.4* 19.4*  INR 2.09* 1.41 1.62*  CREATININE 1.14  --   --     Estimated Creatinine Clearance: 107 mL/min (by C-G formula based on Cr of 1.14).  Medications:  Coumadin 15mg  on MTWSS 12.5mg  Thu/Fri   Assessment: 50 y.o. male admitted with cellulitis, h/o PE/DVT, to continue Coumadin. INR was supratherapeutic on admit at 3.78 but dropped rapidly. It remains subtherapeutic at 1.62. No new CBC today. No bleeding noted.  Goal of Therapy:  INR 2-3   Plan:  - Warfarin 15mg  PO x 1 at noon - Daily INR  Salome Arnt, PharmD, BCPS Pager # [phone] 10/01/2014 8:42 AM

## 2014-10-02 LAB — BASIC METABOLIC PANEL
ANION GAP: 10 (ref 5–15)
BUN: 10 mg/dL (ref 6–23)
CO2: 29 mmol/L (ref 19–32)
Calcium: 9 mg/dL (ref 8.4–10.5)
Chloride: 101 mmol/L (ref 96–112)
Creatinine, Ser: 1.2 mg/dL (ref 0.50–1.35)
GFR calc Af Amer: 81 mL/min — ABNORMAL LOW (ref >90)
GFR calc non Af Amer: 70 mL/min — ABNORMAL LOW (ref >90)
Glucose, Bld: 116 mg/dL — ABNORMAL HIGH (ref 70–99)
POTASSIUM: 4 mmol/L (ref 3.5–5.1)
SODIUM: 140 mmol/L (ref 135–145)

## 2014-10-02 LAB — PROTIME-INR
INR: 1.86 — ABNORMAL HIGH (ref 0.00–1.49)
PROTHROMBIN TIME: 21.6 s — AB (ref 11.6–15.2)

## 2014-10-02 LAB — VANCOMYCIN, TROUGH: Vancomycin Tr: 17 ug/mL (ref 10.0–20.0)

## 2014-10-02 MED ORDER — WARFARIN SODIUM 10 MG PO TABS
12.5000 mg | ORAL_TABLET | Freq: Once | ORAL | Status: AC
Start: 2014-10-02 — End: 2014-10-02
  Administered 2014-10-02: 12.5 mg via ORAL
  Filled 2014-10-02: qty 1

## 2014-10-02 MED ORDER — FUROSEMIDE 10 MG/ML IJ SOLN
20.0000 mg | Freq: Two times a day (BID) | INTRAMUSCULAR | Status: DC
  Administered 2014-10-02 – 2014-10-04 (×4): 20 mg via INTRAVENOUS
  Filled 2014-10-02 (×9): qty 2

## 2014-10-02 MED ORDER — DOXEPIN HCL 25 MG PO CAPS
25.0000 mg | ORAL_CAPSULE | Freq: Once | ORAL | Status: AC
  Administered 2014-10-02: 25 mg via ORAL
  Filled 2014-10-02: qty 1

## 2014-10-02 NOTE — Progress Notes (Addendum)
ANTICOAGULATION CONSULT NOTE - Follow Up Consult  Pharmacy Consult for coumadin Indication: hx of PE/DVT  Allergies  Allergen Reactions  . Xarelto [Rivaroxaban] Dermatitis    Blisters skin  . Clindamycin/Lincomycin Dermatitis    Severe rash/dermatitis from November 2015 still present Jan 2016 from clinda    Patient Measurements: Height: 6\' 4"  (193 cm) Weight: 234 lb 1.6 oz (106.187 kg) IBW/kg (Calculated) : 86.8 Heparin Dosing Weight:   Vital Signs: Temp: 98.3 F (36.8 C) (03/25 0419) Temp Source: Oral (03/25 0419) BP: 114/71 mmHg (03/25 0419) Pulse Rate: 69 (03/25 0419)  Labs:  Recent Labs  09/30/14 0736 10/01/14 0703  LABPROT 17.4* 19.4*  INR 1.41 1.62*    Estimated Creatinine Clearance: 106 mL/min (by C-G formula based on Cr of 1.14).   Medications:  Scheduled:  . Chlorhexidine Gluconate Cloth  6 each Topical Q0600  . feeding supplement (ENSURE)  1 Container Oral TID BM  . mupirocin ointment  1 application Nasal BID  . piperacillin-tazobactam (ZOSYN)  IV  3.375 g Intravenous 3 times per day  . vancomycin  1,500 mg Intravenous Q12H  . Warfarin - Pharmacist Dosing Inpatient   Does not apply q1800   Infusions:    Assessment: 49 yo male with hx of PE/DVT is currently on subtherapeutic coumadin but INR is rising nicely.  INR today is 1.86 from 1.62. Goal of Therapy:  INR 2-3 Monitor platelets by anticoagulation protocol: Yes   Plan:  - coumadin 12.5 mg po x1 - INR in am  So, Tsz-Yin 10/02/2014,8:36 AM  Addendum:   Day # 5 vancomycin for sepsis/cellulitis. Sepsis physiology resolved per MD note.  B LE cellulitis.   Vancomycin trough = 17 mcg/ml on 1500 mg IV q 12h.  Vanc trough is therapeutic.  Creat 1.2, AF.  Plan: Continue vancomycin 1500 mg IV q12h Continue zosyn 3.375 gm IV q8h, infuse over 4 hours  Eudelia Bunch, Pharm.D. [phone] 10/02/2014 6:18 PM

## 2014-10-02 NOTE — Progress Notes (Signed)
[facility] PROGRESS NOTE  [name] [name] ACZ:[id number] DOB: October 16, 1965 DOA: 09/28/2014 PCP: No PCP Per Patient  Assessment/Plan:  Sepsis With respiratory rate of 26 and heart rate of 118, lactic acid was 3.6 on admission (09/28/2014 at 1:30 AM). Patient admitted to stepdown and started on sepsis protocol. Broad-spectrum antibiotics started as well as aggressive hydration with IV fluids. Sepsis physiology resolved. Lactic acid was 1.6 yesterday (09/28/2014 at 4:30 AM). Continue with vancomycin and Zosyn. Improved.  Bilateral lower extremity cellulitis Patient has diffuse rash along his both lower extremities, torso and groin. Has redness and warmth around his lower extremity rash treated as cellulitis with antibiotics. Keep legs elevated, try to decrease the swelling, stop IV fluids and Lasix if needed. Keep the legs moist, use Aquaphor cream.  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 Been recently on several to and thinking this is started the eczematous rash. Currently on Coumadin, continue dosage per pharmacy. INR is 1.86  Eczematous rash As mentioned above patient has diffuse rash almost all over his body, please look at the pictures below. Seen in [facility] in [place] and diagnosed with allergic reaction to several toe as reported. We'll obtain records from [name]. Discontinued steroids cream as patient has infection and this is might worsen it. Patient will probably need his steroids cream to be restarted as soon as infection controlled.    Code Status: Full code Family Communication: No family at bedside Disposition Plan: Home when stable   Consultants:  None  Procedures:  None  Antibiotics:  Vancomycin   Zosyn  HPI/Subjective: 49 y.o. male with history of DVT was on xarelto on October which was  discontinued after one month after patient was found to be allergic to Xarelto and has been placed on Coumadin presents to the ER because of worsening skin rash and blistering with skin discharge of the lower extremities. Patient states that he has had skin reaction involving his lower extremity back hands over the last 5 months. He was recently diagnosed by dermatologist at [facility] of allergic reaction to Xarelto and was discontinued last month and patient was placed on Coumadin for his known DVT. Patient also was placed on clobetasol cream for his rash which patient states ran out one week ago. Patient just recently moved to [place] 3 days ago. Patient noticed increased erythema and blistering of his skin of both his lower extremities more on the left side with increasing swelling. Denies any fever or chills difficulty swallowing shortness of breath or chest pain. In the ER patient was found to be tachycardic and lactic acid was elevated. Patient was given fluid boluses and admitted for sepsis from possible developing cellulitis. Patient was to be transferred to [facility] which was canceled for lack of insurance.  Still complains of itching, leg swelling improved with lasix.  Objective: Filed Vitals:   10/02/14 0858  BP: 112/81  Pulse: 88  Temp: 97.6 F (36.4 C)  Resp: 18    Intake/Output Summary (Last 24 hours) at 10/02/14 1157 Last data filed at 10/02/14 0930  Gross per 24 hour  Intake   1630 ml  Output      0 ml  Net   1630 ml   Filed Weights   09/29/14 2048 09/30/14 0510 10/02/14 0551  Weight: 108.6 kg (239 lb 6.7 oz) 108.6 kg (239  lb 6.7 oz) 106.187 kg (234 lb 1.6 oz)    Exam:   General:  Appears in no acute distress  Cardiovascular: S1-S2 regular  Respiratory: Clear to auscultation bilaterally  Abdomen: Soft, nontender no organomegaly  Skin- Dry skin, eczema noted in the chest  Musculoskeletal: Extensive eczema noted on the lower extremities, with  peeling of skin, erythema and swelling noted in both lower extremities  Data Reviewed: Basic Metabolic Panel:  Recent Labs Lab 09/28/14 0050 09/28/14 0124 09/28/14 0425 09/29/14 0235 10/02/14 0729  NA 136 137 137 141 140  K 3.5 3.3* 3.3* 4.5 4.0  CL 101 101 106 109 101  CO2 21  --  25 24 29   GLUCOSE 111* 112* 137* 105* 116*  BUN 7 7 8 9 10   CREATININE 1.23 1.20 1.14 1.14 1.20  CALCIUM 8.8  --  7.6* 8.3* 9.0   Liver Function Tests:  Recent Labs Lab 09/28/14 0050 09/28/14 0425  AST 16 18  ALT 19 15  ALKPHOS 55 48  BILITOT 0.7 0.9  PROT 5.7* 5.0*  ALBUMIN 3.3* 2.5*    Recent Labs Lab 09/28/14 0050  LIPASE 99*   No results for input(s): AMMONIA in the last 168 hours. CBC:  Recent Labs Lab 09/28/14 0050 09/28/14 0124 09/28/14 0425  WBC 6.6  --  6.5  NEUTROABS 4.9  --  4.0  HGB 16.8 11.6* 13.7  HCT 48.9 34.0* 40.7  MCV 93.1  --  94.7  PLT 197  --  232   Cardiac Enzymes: No results for input(s): CKTOTAL, CKMB, CKMBINDEX, TROPONINI in the last 168 hours. BNP (last 3 results) No results for input(s): BNP in the last 8760 hours.  ProBNP (last 3 results) No results for input(s): PROBNP in the last 8760 hours.  CBG: No results for input(s): GLUCAP in the last 168 hours.  Recent Results (from the past 240 hour(s))  Culture, blood (routine x 2)     Status: None (Preliminary result)   Collection Time: 09/28/14 12:50 AM  Result Value Ref Range Status   Specimen Description BLOOD RIGHT FOREARM  Final   Special Requests BOTTLES DRAWN AEROBIC AND ANAEROBIC 5CC   Final   Culture   Final           BLOOD CULTURE RECEIVED NO GROWTH TO DATE CULTURE WILL BE HELD FOR 5 DAYS BEFORE ISSUING A FINAL NEGATIVE REPORT Performed at [facility]    Report Status PENDING  Incomplete  Culture, blood (routine x 2)     Status: None (Preliminary result)   Collection Time: 09/28/14  1:27 AM  Result Value Ref Range Status   Specimen Description BLOOD LEFT ARM  Final    Special Requests BOTTLES DRAWN AEROBIC AND ANAEROBIC 5CC  Final   Culture   Final           BLOOD CULTURE RECEIVED NO GROWTH TO DATE CULTURE WILL BE HELD FOR 5 DAYS BEFORE ISSUING A FINAL NEGATIVE REPORT Performed at [facility]    Report Status PENDING  Incomplete  MRSA PCR Screening     Status: Abnormal   Collection Time: 09/28/14  3:17 AM  Result Value Ref Range Status   MRSA by PCR POSITIVE (A) NEGATIVE Final    Comment:        The GeneXpert MRSA Assay (FDA approved for NASAL specimens only), is one component of a comprehensive MRSA colonization surveillance program. It is not intended to diagnose MRSA infection nor to guide or monitor treatment for  MRSA infections. RESULT CALLED TO, READ BACK BY AND VERIFIED WITH: STOWE,S RN [phone] ON 09/28/14 MITCHELL,L      Studies: No results found.  Scheduled Meds: . Chlorhexidine Gluconate Cloth  6 each Topical Q0600  . doxepin  25 mg Oral Once  . feeding supplement (ENSURE)  1 Container Oral TID BM  . mupirocin ointment  1 application Nasal BID  . piperacillin-tazobactam (ZOSYN)  IV  3.375 g Intravenous 3 times per day  . vancomycin  1,500 mg Intravenous Q12H  . warfarin  12.5 mg Oral ONCE-1800  . Warfarin - Pharmacist Dosing Inpatient   Does not apply q1800   Continuous Infusions:   Principal Problem:   Sepsis Active Problems:   Asthma, chronic   Leg DVT (deep venous thromboembolism), chronic   Cellulitis    Time spent: 25 min    [facility] Pager [phone]. If 7PM-7AM, please contact night-coverage at [email or web address], password [facility] 10/02/2014, 11:57 AM  LOS: 4 days

## 2014-10-03 LAB — PROTIME-INR
INR: 1.96 — ABNORMAL HIGH (ref 0.00–1.49)
Prothrombin Time: 22.5 seconds — ABNORMAL HIGH (ref 11.6–15.2)

## 2014-10-03 MED ORDER — WARFARIN SODIUM 7.5 MG PO TABS
15.0000 mg | ORAL_TABLET | Freq: Once | ORAL | Status: AC
  Administered 2014-10-03: 15 mg via ORAL
  Filled 2014-10-03 (×2): qty 2

## 2014-10-03 NOTE — Progress Notes (Signed)
[facility] PROGRESS NOTE  GENERAL WEARING SNK:[id number] DOB: 05-17-1966 DOA: 09/28/2014 PCP: No PCP Per Patient  Assessment/Plan:  Sepsis With respiratory rate of 26 and heart rate of 118, lactic acid was 3.6 on admission (09/28/2014 at 1:30 AM). Patient admitted to stepdown and started on sepsis protocol. Broad-spectrum antibiotics started as well as aggressive hydration with IV fluids. Sepsis physiology resolved. Lactic acid was 1.6 on (09/28/2014 at 4:30 AM). Continue with vancomycin and Zosyn. Improved.  Bilateral lower extremity cellulitis Patient has diffuse rash along his both lower extremities, torso and groin. Has redness and warmth around his lower extremity rash treated as cellulitis with antibiotics. Keep legs elevated, try to decrease the swelling, stop IV fluids and Lasix if needed. Keep the legs moist, use Aquaphor cream.  B/L lower extremity edema Improved with IV Lasix,  started Lasix 20 g IV every 12 hours Albumin is 2.5 Started ensure tid Leg swelling has improved, so we'll continue with the Lasix for diuresis.  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 Been recently on several to and thinking this is started the eczematous rash. Currently on Coumadin, continue dosage per pharmacy. INR is 1.96  Eczematous rash  slowly improving As mentioned above patient has diffuse rash almost all over his body, please look at the pictures below. Seen in [facility] in [place] and diagnosed with allergic reaction to several toe as reported. We'll obtain records from [facility]. Discontinued steroids cream as patient has infection and this is might worsen it. Patient will probably need his steroids cream to be restarted as soon as infection controlled.  Continue with hydroxyzine 25 mg 3 times a day when necessary for itching  Code Status: Full code Family Communication: No family at bedside Disposition Plan: Home when  stable   Consultants:  None  Procedures:  None  Antibiotics:  Vancomycin   Zosyn  HPI/Subjective: 49 y.o. male with history of DVT was on xarelto on October which was discontinued after one month after patient was found to be allergic to Xarelto and has been placed on Coumadin presents to the ER because of worsening skin rash and blistering with skin discharge of the lower extremities. Patient states that he has had skin reaction involving his lower extremity back hands over the last 5 months. He was recently diagnosed by dermatologist at [facility] of allergic reaction to Xarelto and was discontinued last month and patient was placed on Coumadin for his known DVT. Patient also was placed on clobetasol cream for his rash which patient states ran out one week ago. Patient just recently moved to [place] 3 days ago. Patient noticed increased erythema and blistering of his skin of both his lower extremities more on the left side with increasing swelling. Denies any fever or chills difficulty swallowing shortness of breath or chest pain. In the ER patient was found to be tachycardic and lactic acid was elevated. Patient was given fluid boluses and admitted for sepsis from possible developing cellulitis. Patient was to be transferred to [facility] which was canceled for lack of insurance.  Still complains of itching, leg swelling improved with lasix.Patient has been ambulating.   Objective: Filed Vitals:   10/03/14 0535  BP: 127/76  Pulse: 82  Temp: 97.5 F (36.4 C)  Resp: 14    Intake/Output Summary (Last 24 hours) at 10/03/14 1038 Last data filed at 10/03/14 0930  Gross per 24 hour  Intake   3719 ml  Output   2525 ml  Net   1194 ml   Filed Weights   09/30/14 0510 10/02/14 0551 10/03/14 0700  Weight: 108.6 kg (239 lb 6.7 oz) 106.187 kg (234 lb 1.6 oz) 104.282 kg (229 lb 14.4 oz)    Exam:   General:  Appears in no acute distress  Cardiovascular: S1-S2  regular  Respiratory: Clear to auscultation bilaterally  Abdomen: Soft, nontender no organomegaly  Skin- Dry skin, eczema noted in the chest  Musculoskeletal: Extensive eczema noted on the lower extremities, with peeling of skin, erythema and swelling noted in both lower extremities  Data Reviewed: Basic Metabolic Panel:  Recent Labs Lab 09/28/14 0050 09/28/14 0124 09/28/14 0425 09/29/14 0235 10/02/14 0729  NA 136 137 137 141 140  K 3.5 3.3* 3.3* 4.5 4.0  CL 101 101 106 109 101  CO2 21  --  25 24 29   GLUCOSE 111* 112* 137* 105* 116*  BUN 7 7 8 9 10   CREATININE 1.23 1.20 1.14 1.14 1.20  CALCIUM 8.8  --  7.6* 8.3* 9.0   Liver Function Tests:  Recent Labs Lab 09/28/14 0050 09/28/14 0425  AST 16 18  ALT 19 15  ALKPHOS 55 48  BILITOT 0.7 0.9  PROT 5.7* 5.0*  ALBUMIN 3.3* 2.5*    Recent Labs Lab 09/28/14 0050  LIPASE 99*   No results for input(s): AMMONIA in the last 168 hours. CBC:  Recent Labs Lab 09/28/14 0050 09/28/14 0124 09/28/14 0425  WBC 6.6  --  6.5  NEUTROABS 4.9  --  4.0  HGB 16.8 11.6* 13.7  HCT 48.9 34.0* 40.7  MCV 93.1  --  94.7  PLT 197  --  232   Cardiac Enzymes: No results for input(s): CKTOTAL, CKMB, CKMBINDEX, TROPONINI in the last 168 hours. BNP (last 3 results) No results for input(s): BNP in the last 8760 hours.  ProBNP (last 3 results) No results for input(s): PROBNP in the last 8760 hours.  CBG: No results for input(s): GLUCAP in the last 168 hours.  Recent Results (from the past 240 hour(s))  Culture, blood (routine x 2)     Status: None (Preliminary result)   Collection Time: 09/28/14 12:50 AM  Result Value Ref Range Status   Specimen Description BLOOD RIGHT FOREARM  Final   Special Requests BOTTLES DRAWN AEROBIC AND ANAEROBIC 5CC   Final   Culture   Final           BLOOD CULTURE RECEIVED NO GROWTH TO DATE CULTURE WILL BE HELD FOR 5 DAYS BEFORE ISSUING A FINAL NEGATIVE REPORT Performed at [facility]     Report Status PENDING  Incomplete  Culture, blood (routine x 2)     Status: None (Preliminary result)   Collection Time: 09/28/14  1:27 AM  Result Value Ref Range Status   Specimen Description BLOOD LEFT ARM  Final   Special Requests BOTTLES DRAWN AEROBIC AND ANAEROBIC 5CC  Final   Culture   Final           BLOOD CULTURE RECEIVED NO GROWTH TO DATE CULTURE WILL BE HELD FOR 5 DAYS BEFORE ISSUING A FINAL NEGATIVE REPORT Performed at [facility]    Report Status PENDING  Incomplete  MRSA PCR Screening     Status: Abnormal   Collection Time: 09/28/14  3:17 AM  Result Value Ref Range Status   MRSA by PCR POSITIVE (A) NEGATIVE Final    Comment:        The GeneXpert MRSA Assay (FDA approved for  NASAL specimens only), is one component of a comprehensive MRSA colonization surveillance program. It is not intended to diagnose MRSA infection nor to guide or monitor treatment for MRSA infections. RESULT CALLED TO, READ BACK BY AND VERIFIED WITH: STOWE,S RN [phone] ON 09/28/14 MITCHELL,L      Studies: No results found.  Scheduled Meds: . feeding supplement (ENSURE)  1 Container Oral TID BM  . furosemide  20 mg Intravenous Q12H  . piperacillin-tazobactam (ZOSYN)  IV  3.375 g Intravenous 3 times per day  . vancomycin  1,500 mg Intravenous Q12H  . Warfarin - Pharmacist Dosing Inpatient   Does not apply q1800   Continuous Infusions:   Principal Problem:   Sepsis Active Problems:   Asthma, chronic   Leg DVT (deep venous thromboembolism), chronic   Cellulitis    Time spent: 25 min    [facility] Pager [phone]. If 7PM-7AM, please contact night-coverage at [email or web address], password [facility] 10/03/2014, 10:38 AM  LOS: 5 days

## 2014-10-03 NOTE — Progress Notes (Signed)
ANTICOAGULATION CONSULT NOTE - Follow Up Consult  Pharmacy Consult for coumadin Indication: hx of PE/DVT  Allergies  Allergen Reactions  . Xarelto [Rivaroxaban] Dermatitis    Blisters skin  . Clindamycin/Lincomycin Dermatitis    Severe rash/dermatitis from November 2015 still present Jan 2016 from clinda    Patient Measurements: Height: 6\' 4"  (193 cm) Weight: 229 lb 14.4 oz (104.282 kg) IBW/kg (Calculated) : 86.8 Heparin Dosing Weight:   Vital Signs: Temp: 97.5 F (36.4 C) (03/26 0535) Temp Source: Oral (03/26 0535) BP: 127/76 mmHg (03/26 0535) Pulse Rate: 82 (03/26 0535)  Labs:  Recent Labs  10/01/14 0703 10/02/14 0729 10/03/14 0542  LABPROT 19.4* 21.6* 22.5*  INR 1.62* 1.86* 1.96*  CREATININE  --  1.20  --     Estimated Creatinine Clearance: 99.9 mL/min (by C-G formula based on Cr of 1.2).   Medications:  Scheduled:  . feeding supplement (ENSURE)  1 Container Oral TID BM  . furosemide  20 mg Intravenous Q12H  . piperacillin-tazobactam (ZOSYN)  IV  3.375 g Intravenous 3 times per day  . vancomycin  1,500 mg Intravenous Q12H  . Warfarin - Pharmacist Dosing Inpatient   Does not apply q1800   Infusions:    Assessment: 49 yo male with hx of PE/DVT is currently on subtherapeutic coumadin but INR rising nicely to 1.96.  Patient was on 15 mg of coumadin daily except 12.5 mg on ThFr prior to admission.  Goal of Therapy:  INR 2-3 Monitor platelets by anticoagulation protocol: Yes   Plan:  - coumadin 15 mg po x1 - INR in am  Nikolai Wilczak, Tsz-Yin 10/03/2014,10:44 AM

## 2014-10-04 LAB — COMPREHENSIVE METABOLIC PANEL
ALT: 26 U/L (ref 0–53)
ANION GAP: 9 (ref 5–15)
AST: 38 U/L — ABNORMAL HIGH (ref 0–37)
Albumin: 2.8 g/dL — ABNORMAL LOW (ref 3.5–5.2)
Alkaline Phosphatase: 68 U/L (ref 39–117)
BUN: 14 mg/dL (ref 6–23)
CHLORIDE: 102 mmol/L (ref 96–112)
CO2: 30 mmol/L (ref 19–32)
Calcium: 8.6 mg/dL (ref 8.4–10.5)
Creatinine, Ser: 1.32 mg/dL (ref 0.50–1.35)
GFR calc non Af Amer: 62 mL/min — ABNORMAL LOW (ref >90)
GFR, EST AFRICAN AMERICAN: 72 mL/min — AB (ref >90)
GLUCOSE: 105 mg/dL — AB (ref 70–99)
POTASSIUM: 4.2 mmol/L (ref 3.5–5.1)
Sodium: 141 mmol/L (ref 135–145)
TOTAL PROTEIN: 5.7 g/dL — AB (ref 6.0–8.3)
Total Bilirubin: 0.9 mg/dL (ref 0.3–1.2)

## 2014-10-04 LAB — CULTURE, BLOOD (ROUTINE X 2)
CULTURE: NO GROWTH
CULTURE: NO GROWTH

## 2014-10-04 LAB — PROTIME-INR
INR: 2 — ABNORMAL HIGH (ref 0.00–1.49)
Prothrombin Time: 22.9 seconds — ABNORMAL HIGH (ref 11.6–15.2)

## 2014-10-04 MED ORDER — CAMPHOR-MENTHOL 0.5-0.5 % EX LOTN
TOPICAL_LOTION | CUTANEOUS | Status: DC | PRN
Start: 2014-10-04 — End: 2014-10-05

## 2014-10-04 MED ORDER — OXYCODONE-ACETAMINOPHEN 5-325 MG PO TABS: 1.0000 | ORAL_TABLET | Freq: Three times a day (TID) | ORAL | Status: DC | PRN

## 2014-10-04 MED ORDER — WARFARIN SODIUM 7.5 MG PO TABS
15.0000 mg | ORAL_TABLET | Freq: Once | ORAL | Status: DC
  Filled 2014-10-04: qty 2

## 2014-10-04 MED ORDER — CLOBETASOL PROPIONATE 0.05 % EX CREA
1.0000 | TOPICAL_CREAM | Freq: Two times a day (BID) | CUTANEOUS | Status: DC
Start: 2014-10-04 — End: 2014-10-08

## 2014-10-04 MED ORDER — FUROSEMIDE 20 MG PO TABS: 20.0000 mg | ORAL_TABLET | Freq: Every day | ORAL | Status: DC

## 2014-10-04 MED ORDER — ENSURE PUDDING PO PUDG: 1.0000 | Freq: Three times a day (TID) | ORAL | Status: DC

## 2014-10-04 NOTE — Progress Notes (Signed)
NURSING PROGRESS NOTE  MURPHY DUZAN [id number] Discharge Data: 10/04/2014 12:06 PM Attending Provider: No att. providers found PCP:No PCP Per Patient   [name] to be D/C'd Home per MD order.    All IV's will be discontinued and monitored for bleeding.  All belongings will be returned to patient for patient to take home. Hard Copies of all medications given to patient, patient verbalized understanding that he needed to go to the pharmacy and get them filled today. Patient also verbalized he understood to take only the medications listed on the discharge paperwork in the dosages listed.  Last Documented Vital Signs:  Blood pressure 117/83, pulse 92, temperature 97.5 F (36.4 C), temperature source Oral, resp. rate 18, height 6\' 4"  (1.93 m), weight 104.237 kg (229 lb 12.8 oz), SpO2 100 %.  Hendricks Limes RN, BS, BSN

## 2014-10-04 NOTE — Discharge Summary (Signed)
Physician Discharge Summary  [name] [name] EHU:[id number] DOB: 15-Oct-1965 DOA: 09/28/2014  PCP: No PCP Per Patient  Admit date: 09/28/2014 Discharge date: 10/04/2014  Time spent: *35  minutes  Recommendations for Outpatient Follow-up:  1. *Follow up PCP in 2 weeks  Discharge Diagnoses:  Principal Problem:   Sepsis Active Problems:   Asthma, chronic   Leg DVT (deep venous thromboembolism), chronic   Cellulitis   Discharge Condition: Stable  Diet recommendation: Stable  Filed Weights   10/02/14 0551 10/03/14 0700 10/04/14 0500  Weight: 106.187 kg (234 lb 1.6 oz) 104.282 kg (229 lb 14.4 oz) 104.237 kg (229 lb 12.8 oz)    History of present illness:  [name] [name] is a 49 y.o. male with history of DVT was on xarelto on October which was discontinued after one month after patient was found to be allergic to Xarelto and has been placed on Coumadin presents to the ER because of worsening skin rash and blistering with skin discharge of the lower extremities. Patient states that he has had skin reaction involving his lower extremity back hands over the last 5 months. He was recently diagnosed by dermatologist at [facility] of allergic reaction to Xarelto and was discontinued last month and patient was placed on Coumadin for his known DVT. Patient also was placed on clobetasol cream for his rash which patient states ran out one week ago. Patient just recently moved to [place] 3 days ago. Patient noticed increased erythema and blistering of his skin of both his lower extremities more on the left side with increasing swelling. Denies any fever or chills difficulty swallowing shortness of breath or chest pain. In the ER patient was found to be tachycardic and lactic acid was elevated. Patient was given fluid boluses and admitted for sepsis from possible developing cellulitis. Initially admitted to stepdown, stabilize overnight and transferred to regular bed today. [facility] for further evaluation for his rash.   Hospital Course:   Sepsis Resolved With respiratory rate of 26 and heart rate of 118, lactic acid was 3.6 on admission (09/28/2014 at 1:30 AM). Patient admitted to stepdown and started on sepsis protocol. Broad-spectrum antibiotics started as well as aggressive hydration with IV fluids. Sepsis physiology resolved. Lactic acid is 1.6 yesterday (09/28/2014 at 4:30 AM).  Bilateral lower extremity cellulitis Patient has diffuse rash along his both lower extremities, torso and groin. Has redness and warmth around his lower extremity rash treated as cellulitis with antibiotics. Keep legs elevated, try to decrease the swelling, stoped IV fluids  Keep the legs moist, use Aquaphor cream. He has received antibiotics in the hospital  Chronic DVT Patient has history of DVT, on Coumadin. Been recently on several to and thinking this is started the eczematous rash. Currently on Coumadin, INR is 2.09. Will follow up coumadin clinic  Eczematous rash As mentioned above patient has diffuse rash almost all over his body, please look at the pictures below. Seen in [facility] in [place] and diagnosed with allergic reaction to several toe as reported. We'll obtain records from [facility]. Discontinued steroids cream as patient has infection and this is might worsen it. Will restart Clobetasol.05% topical bid  Procedures:  None  Consultations:  None  Discharge Exam: Filed Vitals:   10/04/14 0545  BP: 117/83  Pulse: 92  Temp: 97.5 F (36.4 C)  Resp: 18    General: Appear in no acute distress Cardiovascular: *S1s2 RRR Respiratory: Clear bilaterally  Discharge Instructions   Discharge Instructions    Diet - low sodium heart healthy    Complete by:  As directed      Diet - low sodium heart healthy    Complete by:  As directed      Increase activity slowly    Complete by:  As directed      Increase  activity slowly    Complete by:  As directed           Current Discharge Medication List    START taking these medications   Details  camphor-menthol (SARNA) lotion Apply topically as needed for itching. Qty: 222 mL, Refills: 0    clobetasol cream (TEMOVATE) 8.11 % Apply 1 application topically 2 (two) times daily. Apply to legs twice daily as needed Qty: 30 g, Refills: 0    feeding supplement, ENSURE, (ENSURE) PUDG Take 1 Container by mouth 3 (three) times daily between meals. Qty: 30 Can, Refills: 2    furosemide (LASIX) 20 MG tablet Take 1 tablet (20 mg total) by mouth daily. Qty: 30 tablet, Refills: 0    piperacillin-tazobactam (ZOSYN) 3.375 GM/50ML IVPB Inject 50 mLs (3.375 g total) into the vein every 8 (eight) hours. Qty: 50 mL      CONTINUE these medications which have CHANGED   Details  oxyCODONE-acetaminophen (PERCOCET/ROXICET) 5-325 MG per tablet Take 1 tablet by mouth every 8 (eight) hours as needed for moderate pain or severe pain. Qty: 30 tablet, Refills: 0      CONTINUE these medications which have NOT CHANGED   Details  albuterol (PROVENTIL HFA;VENTOLIN HFA) 108 (90 BASE) MCG/ACT inhaler Inhale 2 puffs into the lungs every 6 (six) hours as needed for wheezing or shortness of breath.    albuterol (PROVENTIL) (2.5 MG/3ML) 0.083% nebulizer solution Take 3 mLs (2.5 mg total) by nebulization every 6 (six) hours as needed for wheezing. Qty: 75 mL, Refills: 11    hydrocerin (EUCERIN) CREA Apply 1 application topically 2 (two) times daily. Qty: 228 g, Refills: 0    hydrOXYzine (ATARAX/VISTARIL) 25 MG tablet Take 1 tablet (25 mg total) by mouth 3 (three) times daily as needed for itching. Qty: 30 tablet, Refills: 0    warfarin (COUMADIN) 5 MG tablet Take 12.5-15 mg by mouth daily. Take 15mg  on Monday, Tuesday, Wednesday, Saturday and Sunday.  12.5mg  on Thursday and Friday.    doxepin (SINEQUAN) 25 MG capsule Take 25 mg by mouth at bedtime as needed (sleep).     terbinafine (LAMISIL) 1 % cream Apply 1 application topically 2 (two) times daily.      STOP taking these medications     enoxaparin (LOVENOX) 100 MG/ML injection      amoxicillin-clavulanate (AUGMENTIN) 875-125 MG per tablet      Rivaroxaban (XARELTO) 20 MG TABS        Allergies  Allergen Reactions  . Xarelto [Rivaroxaban] Dermatitis    Blisters skin  . Clindamycin/Lincomycin Dermatitis    Severe rash/dermatitis from November 2015 still present Jan 2016 from clinda      The results of significant diagnostics from this hospitalization (including imaging, microbiology, ancillary and laboratory) are listed below for reference.    Significant Diagnostic Studies: Dg Chest Port 1 View  09/28/2014   CLINICAL DATA:  Sepsis, dyspnea  EXAM: PORTABLE CHEST - 1 VIEW  COMPARISON:  09/29/2013  FINDINGS: A single AP portable view of the chest demonstrates no focal airspace consolidation or alveolar edema. The lungs are grossly clear. Left nipple shadow  noted, similar to the 12/20/2012 exam. There is no large effusion or pneumothorax. Cardiac and mediastinal contours appear unremarkable.  IMPRESSION: No active disease.   Electronically Signed   By: Andreas Newport M.D.   On: 09/28/2014 04:14    Microbiology: Recent Results (from the past 240 hour(s))  Culture, blood (routine x 2)     Status: None   Collection Time: 09/28/14 12:50 AM  Result Value Ref Range Status   Specimen Description BLOOD RIGHT FOREARM  Final   Special Requests BOTTLES DRAWN AEROBIC AND ANAEROBIC 5CC   Final   Culture   Final    NO GROWTH 5 DAYS Performed at [facility]    Report Status 10/04/2014 FINAL  Final  Culture, blood (routine x 2)     Status: None   Collection Time: 09/28/14  1:27 AM  Result Value Ref Range Status   Specimen Description BLOOD LEFT ARM  Final   Special Requests BOTTLES DRAWN AEROBIC AND ANAEROBIC 5CC  Final   Culture   Final    NO GROWTH 5 DAYS Performed at [facility]    Report Status 10/04/2014 FINAL  Final  MRSA PCR Screening     Status: Abnormal   Collection Time: 09/28/14  3:17 AM  Result Value Ref Range Status   MRSA by PCR POSITIVE (A) NEGATIVE Final    Comment:        The GeneXpert MRSA Assay (FDA approved for NASAL specimens only), is one component of a comprehensive MRSA colonization surveillance program. It is not intended to diagnose MRSA infection nor to guide or monitor treatment for MRSA infections. RESULT CALLED TO, READ BACK BY AND VERIFIED WITH: STOWE,S RN [phone] ON 09/28/14 MITCHELL,L      Labs: Basic Metabolic Panel:  Recent Labs Lab 09/28/14 0050 09/28/14 0124 09/28/14 0425 09/29/14 0235 10/02/14 0729 10/04/14 0650  NA 136 137 137 141 140 141  K 3.5 3.3* 3.3* 4.5 4.0 4.2  CL 101 101 106 109 101 102  CO2 21  --  25 24 29 30   GLUCOSE 111* 112* 137* 105* 116* 105*  BUN 7 7 8 9 10 14   CREATININE 1.23 1.20 1.14 1.14 1.20 1.32  CALCIUM 8.8  --  7.6* 8.3* 9.0 8.6   Liver Function Tests:  Recent Labs Lab 09/28/14 0050 09/28/14 0425 10/04/14 0650  AST 16 18 38*  ALT 19 15 26   ALKPHOS 55 48 68  BILITOT 0.7 0.9 0.9  PROT 5.7* 5.0* 5.7*  ALBUMIN 3.3* 2.5* 2.8*    Recent Labs Lab 09/28/14 0050  LIPASE 99*   No results for input(s): AMMONIA in the last 168 hours. CBC:  Recent Labs Lab 09/28/14 0050 09/28/14 0124 09/28/14 0425  WBC 6.6  --  6.5  NEUTROABS 4.9  --  4.0  HGB 16.8 11.6* 13.7  HCT 48.9 34.0* 40.7  MCV 93.1  --  94.7  PLT 197  --  232   Cardiac Enzymes: No results for input(s): CKTOTAL, CKMB, CKMBINDEX, TROPONINI in the last 168 hours. BNP: BNP (last 3 results) No results for input(s): BNP in the last 8760 hours.  ProBNP (last 3 results) No results for input(s): PROBNP in the last 8760 hours.  CBG: No results for input(s): GLUCAP in the last 168 hours.     SignedEleonore Chiquito S  [facility] 10/04/2014, 11:16 AM

## 2014-10-04 NOTE — Progress Notes (Signed)
ANTICOAGULATION CONSULT NOTE - Follow Up Consult  Pharmacy Consult for coumadin Indication: hx of PE/DVT  Allergies  Allergen Reactions  . Xarelto [Rivaroxaban] Dermatitis    Blisters skin  . Clindamycin/Lincomycin Dermatitis    Severe rash/dermatitis from November 2015 still present Jan 2016 from clinda    Patient Measurements: Height: 6\' 4"  (193 cm) Weight: 229 lb 12.8 oz (104.237 kg) IBW/kg (Calculated) : 86.8 Heparin Dosing Weight:   Vital Signs: Temp: 97.5 F (36.4 C) (03/27 0545) Temp Source: Oral (03/27 0545) BP: 117/83 mmHg (03/27 0545) Pulse Rate: 92 (03/27 0545)  Labs:  Recent Labs  10/02/14 0729 10/03/14 0542 10/04/14 0650  LABPROT 21.6* 22.5* 22.9*  INR 1.86* 1.96* 2.00*  CREATININE 1.20  --  1.32    Estimated Creatinine Clearance: 90.8 mL/min (by C-G formula based on Cr of 1.32).   Medications:  Scheduled:  . feeding supplement (ENSURE)  1 Container Oral TID BM  . furosemide  20 mg Intravenous Q12H  . piperacillin-tazobactam (ZOSYN)  IV  3.375 g Intravenous 3 times per day  . vancomycin  1,500 mg Intravenous Q12H  . Warfarin - Pharmacist Dosing Inpatient   Does not apply q1800   Infusions:    Assessment: 49 yo male with hx of PE/DVT is currently on therapeutic coumadin.  INR today is 2. Goal of Therapy:  INR 2-3 Monitor platelets by anticoagulation protocol: Yes   Plan:  Coumadin 15 mg po x1 INR in am  Kellee Sittner, Tsz-Yin 10/04/2014,10:49 AM

## 2014-10-05 ENCOUNTER — Inpatient Hospital Stay (HOSPITAL_COMMUNITY)
Admission: EM | Admit: 2014-10-05 | Discharge: 2014-10-08 | DRG: 872 | Disposition: A | Discharge status: Home / Self Care | Payer: Self-pay | Attending: Internal Medicine | Admitting: Internal Medicine

## 2014-10-05 ENCOUNTER — Encounter (HOSPITAL_COMMUNITY): Payer: Self-pay | Admitting: Neurology

## 2014-10-05 DIAGNOSIS — R600 Localized edema: Secondary | ICD-10-CM

## 2014-10-05 DIAGNOSIS — R000 Tachycardia, unspecified: Secondary | ICD-10-CM | POA: Diagnosis present

## 2014-10-05 DIAGNOSIS — N179 Acute kidney failure, unspecified: Secondary | ICD-10-CM

## 2014-10-05 DIAGNOSIS — I82401 Acute embolism and thrombosis of unspecified deep veins of right lower extremity: Secondary | ICD-10-CM

## 2014-10-05 DIAGNOSIS — K219 Gastro-esophageal reflux disease without esophagitis: Secondary | ICD-10-CM | POA: Diagnosis present

## 2014-10-05 DIAGNOSIS — I82501 Chronic embolism and thrombosis of unspecified deep veins of right lower extremity: Secondary | ICD-10-CM | POA: Diagnosis present

## 2014-10-05 DIAGNOSIS — Z86711 Personal history of pulmonary embolism: Secondary | ICD-10-CM

## 2014-10-05 DIAGNOSIS — J45909 Unspecified asthma, uncomplicated: Secondary | ICD-10-CM | POA: Diagnosis present

## 2014-10-05 DIAGNOSIS — L309 Dermatitis, unspecified: Secondary | ICD-10-CM | POA: Diagnosis present

## 2014-10-05 DIAGNOSIS — Z7901 Long term (current) use of anticoagulants: Secondary | ICD-10-CM

## 2014-10-05 DIAGNOSIS — F17210 Nicotine dependence, cigarettes, uncomplicated: Secondary | ICD-10-CM | POA: Diagnosis present

## 2014-10-05 DIAGNOSIS — L0390 Cellulitis, unspecified: Secondary | ICD-10-CM | POA: Diagnosis present

## 2014-10-05 DIAGNOSIS — R609 Edema, unspecified: Secondary | ICD-10-CM

## 2014-10-05 DIAGNOSIS — M1990 Unspecified osteoarthritis, unspecified site: Secondary | ICD-10-CM | POA: Diagnosis present

## 2014-10-05 DIAGNOSIS — F17200 Nicotine dependence, unspecified, uncomplicated: Secondary | ICD-10-CM | POA: Diagnosis present

## 2014-10-05 DIAGNOSIS — T7840XD Allergy, unspecified, subsequent encounter: Secondary | ICD-10-CM

## 2014-10-05 DIAGNOSIS — N189 Chronic kidney disease, unspecified: Secondary | ICD-10-CM

## 2014-10-05 DIAGNOSIS — T7840XA Allergy, unspecified, initial encounter: Secondary | ICD-10-CM | POA: Insufficient documentation

## 2014-10-05 DIAGNOSIS — R21 Rash and other nonspecific skin eruption: Secondary | ICD-10-CM

## 2014-10-05 DIAGNOSIS — Z881 Allergy status to other antibiotic agents status: Secondary | ICD-10-CM

## 2014-10-05 DIAGNOSIS — J4540 Moderate persistent asthma, uncomplicated: Secondary | ICD-10-CM | POA: Diagnosis present

## 2014-10-05 DIAGNOSIS — A419 Sepsis, unspecified organism: Principal | ICD-10-CM

## 2014-10-05 LAB — COMPREHENSIVE METABOLIC PANEL
ALT: 26 U/L (ref 0–53)
AST: 24 U/L (ref 0–37)
Albumin: 2.7 g/dL — ABNORMAL LOW (ref 3.5–5.2)
Alkaline Phosphatase: 59 U/L (ref 39–117)
Anion gap: 12 (ref 5–15)
BILIRUBIN TOTAL: 0.5 mg/dL (ref 0.3–1.2)
BUN: 10 mg/dL (ref 6–23)
CHLORIDE: 105 mmol/L (ref 96–112)
CO2: 27 mmol/L (ref 19–32)
CREATININE: 1.64 mg/dL — AB (ref 0.50–1.35)
Calcium: 8.7 mg/dL (ref 8.4–10.5)
GFR calc Af Amer: 56 mL/min — ABNORMAL LOW (ref >90)
GFR calc non Af Amer: 48 mL/min — ABNORMAL LOW (ref >90)
Glucose, Bld: 144 mg/dL — ABNORMAL HIGH (ref 70–99)
Potassium: 4.1 mmol/L (ref 3.5–5.1)
Sodium: 144 mmol/L (ref 135–145)
Total Protein: 5.6 g/dL — ABNORMAL LOW (ref 6.0–8.3)

## 2014-10-05 LAB — CBC WITH DIFFERENTIAL/PLATELET
Basophils Absolute: 0 10*3/uL (ref 0.0–0.1)
Basophils Relative: 1 % (ref 0–1)
Eosinophils Absolute: 0.5 10*3/uL (ref 0.0–0.7)
Eosinophils Relative: 7 % — ABNORMAL HIGH (ref 0–5)
HCT: 44.5 % (ref 39.0–52.0)
HEMOGLOBIN: 14.4 g/dL (ref 13.0–17.0)
LYMPHS ABS: 1.1 10*3/uL (ref 0.7–4.0)
Lymphocytes Relative: 14 % (ref 12–46)
MCH: 31.4 pg (ref 26.0–34.0)
MCHC: 32.4 g/dL (ref 30.0–36.0)
MCV: 97.2 fL (ref 78.0–100.0)
Monocytes Absolute: 0.7 10*3/uL (ref 0.1–1.0)
Monocytes Relative: 9 % (ref 3–12)
Neutro Abs: 5.3 10*3/uL (ref 1.7–7.7)
Neutrophils Relative %: 69 % (ref 43–77)
Platelets: 321 10*3/uL (ref 150–400)
RBC: 4.58 MIL/uL (ref 4.22–5.81)
RDW: 14.2 % (ref 11.5–15.5)
WBC: 7.6 10*3/uL (ref 4.0–10.5)

## 2014-10-05 LAB — I-STAT CG4 LACTIC ACID, ED
Lactic Acid, Venous: 1.61 mmol/L (ref 0.5–2.0)
Lactic Acid, Venous: 1.59 mmol/L (ref 0.5–2.0)

## 2014-10-05 LAB — SEDIMENTATION RATE: SED RATE: 17 mm/h — AB (ref 0–16)

## 2014-10-05 LAB — PROTIME-INR
INR: 2.58 — ABNORMAL HIGH (ref 0.00–1.49)
Prothrombin Time: 27.9 seconds — ABNORMAL HIGH (ref 11.6–15.2)

## 2014-10-05 LAB — C-REACTIVE PROTEIN: CRP: 6.3 mg/dL — ABNORMAL HIGH (ref <0.60)

## 2014-10-05 MED ORDER — HYDROCERIN EX CREA
1.0000 "application " | TOPICAL_CREAM | Freq: Two times a day (BID) | CUTANEOUS | Status: DC
  Administered 2014-10-05 – 2014-10-08 (×6): 1 via TOPICAL
  Filled 2014-10-05 (×2): qty 113

## 2014-10-05 MED ORDER — MORPHINE SULFATE 2 MG/ML IJ SOLN
2.0000 mg | INTRAMUSCULAR | Status: DC | PRN
  Administered 2014-10-05 – 2014-10-08 (×14): 2 mg via INTRAVENOUS
  Filled 2014-10-05 (×14): qty 1

## 2014-10-05 MED ORDER — POLYETHYLENE GLYCOL 3350 17 G PO PACK
17.0000 g | PACK | Freq: Every day | ORAL | Status: DC | PRN
  Filled 2014-10-05: qty 1

## 2014-10-05 MED ORDER — ALBUTEROL SULFATE HFA 108 (90 BASE) MCG/ACT IN AERS: 2.0000 | INHALATION_SPRAY | Freq: Four times a day (QID) | RESPIRATORY_TRACT | Status: DC | PRN

## 2014-10-05 MED ORDER — FAMOTIDINE 40 MG PO TABS
40.0000 mg | ORAL_TABLET | Freq: Every day | ORAL | Status: DC
  Administered 2014-10-05 – 2014-10-08 (×4): 40 mg via ORAL
  Filled 2014-10-05 (×4): qty 1

## 2014-10-05 MED ORDER — METHYLPREDNISOLONE SODIUM SUCC 40 MG IJ SOLR
40.0000 mg | Freq: Two times a day (BID) | INTRAMUSCULAR | Status: DC
  Administered 2014-10-05 – 2014-10-07 (×4): 40 mg via INTRAVENOUS
  Filled 2014-10-05 (×8): qty 1

## 2014-10-05 MED ORDER — MORPHINE SULFATE 4 MG/ML IJ SOLN
4.0000 mg | Freq: Once | INTRAMUSCULAR | Status: AC
  Administered 2014-10-05: 4 mg via INTRAVENOUS
  Filled 2014-10-05: qty 1

## 2014-10-05 MED ORDER — LIDOCAINE HCL (PF) 1 % IJ SOLN
5.0000 mL | INTRAMUSCULAR | Status: DC
  Filled 2014-10-05: qty 5

## 2014-10-05 MED ORDER — ACETAMINOPHEN 325 MG PO TABS: 650.0000 mg | ORAL_TABLET | Freq: Four times a day (QID) | ORAL | Status: DC | PRN

## 2014-10-05 MED ORDER — MORPHINE SULFATE 4 MG/ML IJ SOLN
4.0000 mg | INTRAMUSCULAR | Status: DC | PRN
  Administered 2014-10-05 (×2): 4 mg via INTRAVENOUS
  Filled 2014-10-05 (×2): qty 1

## 2014-10-05 MED ORDER — SODIUM CHLORIDE 0.9 % IV BOLUS (SEPSIS)
1000.0000 mL | Freq: Once | INTRAVENOUS | Status: AC
  Administered 2014-10-05: 1000 mL via INTRAVENOUS

## 2014-10-05 MED ORDER — ONDANSETRON HCL 4 MG/2ML IJ SOLN: 4.0000 mg | Freq: Three times a day (TID) | INTRAMUSCULAR | Status: AC | PRN

## 2014-10-05 MED ORDER — WARFARIN SODIUM 2.5 MG PO TABS
12.5000 mg | ORAL_TABLET | Freq: Once | ORAL | Status: AC
  Administered 2014-10-05: 12.5 mg via ORAL
  Filled 2014-10-05: qty 1

## 2014-10-05 MED ORDER — HYDROXYZINE HCL 25 MG PO TABS: 25.0000 mg | ORAL_TABLET | Freq: Three times a day (TID) | ORAL | Status: DC | PRN

## 2014-10-05 MED ORDER — METHYLPREDNISOLONE SODIUM SUCC 125 MG IJ SOLR
125.0000 mg | Freq: Once | INTRAMUSCULAR | Status: AC
  Administered 2014-10-05: 125 mg via INTRAVENOUS
  Filled 2014-10-05: qty 2

## 2014-10-05 MED ORDER — HYDROCODONE-ACETAMINOPHEN 5-325 MG PO TABS
1.0000 | ORAL_TABLET | ORAL | Status: DC | PRN
  Administered 2014-10-07 – 2014-10-08 (×6): 2 via ORAL
  Filled 2014-10-05 (×6): qty 2

## 2014-10-05 MED ORDER — SODIUM CHLORIDE 0.9 % IV SOLN
INTRAVENOUS | Status: AC
  Administered 2014-10-05: 14:00:00 via INTRAVENOUS

## 2014-10-05 MED ORDER — PROMETHAZINE HCL 25 MG PO TABS: 12.5000 mg | ORAL_TABLET | Freq: Four times a day (QID) | ORAL | Status: DC | PRN

## 2014-10-05 MED ORDER — CLOBETASOL PROPIONATE 0.05 % EX CREA
TOPICAL_CREAM | Freq: Two times a day (BID) | CUTANEOUS | Status: DC
  Administered 2014-10-05 – 2014-10-06 (×3): via TOPICAL
  Filled 2014-10-05 (×4): qty 15

## 2014-10-05 MED ORDER — ONDANSETRON HCL 4 MG/2ML IJ SOLN: 4.0000 mg | Freq: Four times a day (QID) | INTRAMUSCULAR | Status: DC | PRN

## 2014-10-05 MED ORDER — ALBUTEROL SULFATE (2.5 MG/3ML) 0.083% IN NEBU: 2.5000 mg | INHALATION_SOLUTION | Freq: Four times a day (QID) | RESPIRATORY_TRACT | Status: DC | PRN

## 2014-10-05 MED ORDER — DIPHENHYDRAMINE HCL 50 MG/ML IJ SOLN
25.0000 mg | Freq: Once | INTRAMUSCULAR | Status: AC
  Administered 2014-10-05: 25 mg via INTRAVENOUS
  Filled 2014-10-05: qty 1

## 2014-10-05 MED ORDER — ALUM & MAG HYDROXIDE-SIMETH 200-200-20 MG/5ML PO SUSP: 30.0000 mL | Freq: Four times a day (QID) | ORAL | Status: DC | PRN

## 2014-10-05 MED ORDER — ONDANSETRON HCL 4 MG PO TABS: 4.0000 mg | ORAL_TABLET | Freq: Four times a day (QID) | ORAL | Status: DC | PRN

## 2014-10-05 MED ORDER — SODIUM CHLORIDE 0.9 % IV SOLN
INTRAVENOUS | Status: DC
  Administered 2014-10-05 – 2014-10-07 (×3): via INTRAVENOUS

## 2014-10-05 MED ORDER — SODIUM CHLORIDE 0.9 % IJ SOLN
3.0000 mL | Freq: Two times a day (BID) | INTRAMUSCULAR | Status: DC
  Administered 2014-10-05 – 2014-10-07 (×4): 3 mL via INTRAVENOUS

## 2014-10-05 MED ORDER — DOXEPIN HCL 25 MG PO CAPS
25.0000 mg | ORAL_CAPSULE | Freq: Every evening | ORAL | Status: DC | PRN
  Filled 2014-10-05: qty 1

## 2014-10-05 MED ORDER — ENSURE PUDDING PO PUDG
1.0000 | Freq: Three times a day (TID) | ORAL | Status: DC
  Administered 2014-10-05 – 2014-10-08 (×5): 1 via ORAL

## 2014-10-05 MED ORDER — ACETAMINOPHEN 650 MG RE SUPP: 650.0000 mg | Freq: Four times a day (QID) | RECTAL | Status: DC | PRN

## 2014-10-05 MED ORDER — TRAZODONE 25 MG HALF TABLET
25.0000 mg | ORAL_TABLET | Freq: Every evening | ORAL | Status: DC | PRN
  Administered 2014-10-06 – 2014-10-07 (×2): 25 mg via ORAL
  Filled 2014-10-05 (×4): qty 1

## 2014-10-05 MED ORDER — HYDROMORPHONE HCL 1 MG/ML IJ SOLN
1.0000 mg | INTRAMUSCULAR | Status: DC | PRN
  Administered 2014-10-05: 1 mg via INTRAVENOUS
  Filled 2014-10-05: qty 1

## 2014-10-05 MED ORDER — DIPHENHYDRAMINE HCL 25 MG PO CAPS
25.0000 mg | ORAL_CAPSULE | ORAL | Status: DC | PRN
  Administered 2014-10-06 – 2014-10-08 (×4): 25 mg via ORAL
  Filled 2014-10-05 (×5): qty 1

## 2014-10-05 MED ORDER — DOXYCYCLINE HYCLATE 100 MG PO TABS
100.0000 mg | ORAL_TABLET | Freq: Two times a day (BID) | ORAL | Status: DC
  Administered 2014-10-05 – 2014-10-08 (×6): 100 mg via ORAL
  Filled 2014-10-05 (×7): qty 1

## 2014-10-05 MED ORDER — WARFARIN - PHARMACIST DOSING INPATIENT
Freq: Every day | Status: DC
  Administered 2014-10-07: 19:00:00

## 2014-10-05 NOTE — Progress Notes (Signed)
ANTICOAGULATION CONSULT NOTE - Initial Consult  Pharmacy Consult for Coumadin Indication: h/o DVT,PE (5 months ago)  Allergies  Allergen Reactions  . Xarelto [Rivaroxaban] Dermatitis    Blisters skin  . Clindamycin/Lincomycin Dermatitis    Severe rash/dermatitis from November 2015 still present Jan 2016 from clinda    Patient Measurements: Height: 6\' 4"  (193 cm) Weight: 229 lb (103.874 kg) IBW/kg (Calculated) : 86.8  Vital Signs: Temp: 98.1 F (36.7 C) (03/28 1400) Temp Source: Oral (03/28 1400) BP: 130/83 mmHg (03/28 1400) Pulse Rate: 106 (03/28 1400)  Labs:  Recent Labs  10/03/14 0542 10/04/14 0650 10/05/14 0920  HGB  --   --  14.4  HCT  --   --  44.5  PLT  --   --  321  LABPROT 22.5* 22.9* 27.9*  INR 1.96* 2.00* 2.58*  CREATININE  --  1.32 1.64*    Estimated Creatinine Clearance: 67.6 mL/min (by C-G formula based on Cr of 1.64).   Medical History: Past Medical History  Diagnosis Date  . DVT (deep venous thrombosis)     takes Xarelto daily--- in right leg x 2 and lung x 1  . Substance abuse     crack, cocaine, last use 2007  . Tobacco abuse   . DVT (deep venous thrombosis)   . Bronchitis     last time 2 yrs ago  . Heart murmur   . Shortness of breath   . Tuberculosis     ' I test Positive "  . Asthma     uses Albuterol daily as needed  . Cough     smokers  . Arthritis     knees  . Joint pain   . GERD (gastroesophageal reflux disease)     only takes something about 2 times a yr  . Cellulitis     Medications:  Prescriptions prior to admission  Medication Sig Dispense Refill Last Dose  . albuterol (PROVENTIL HFA;VENTOLIN HFA) 108 (90 BASE) MCG/ACT inhaler Inhale 2 puffs into the lungs every 6 (six) hours as needed for wheezing or shortness of breath.   Past Month at Unknown time  . clobetasol cream (TEMOVATE) 6.14 % Apply 1 application topically 2 (two) times daily. Apply to legs twice daily as needed 30 g 0 Past Week at Unknown time  .  doxepin (SINEQUAN) 25 MG capsule Take 25 mg by mouth at bedtime as needed (sleep).   Past Week at Unknown time  . feeding supplement, ENSURE, (ENSURE) PUDG Take 1 Container by mouth 3 (three) times daily between meals. 30 Can 2 10/04/2014 at Unknown time  . furosemide (LASIX) 20 MG tablet Take 1 tablet (20 mg total) by mouth daily. 30 tablet 0 10/04/2014 at Unknown time  . hydrocerin (EUCERIN) CREA Apply 1 application topically 2 (two) times daily. 228 g 0 Past Week at Unknown time  . hydrOXYzine (ATARAX/VISTARIL) 25 MG tablet Take 1 tablet (25 mg total) by mouth 3 (three) times daily as needed for itching. 30 tablet 0 10/04/2014 at Unknown time  . warfarin (COUMADIN) 5 MG tablet Take 12.5-15 mg by mouth daily. Take 15mg  on Monday, Tuesday, Wednesday, Saturday and Sunday.  12.5mg  on Thursday and Friday.   10/04/2014 at Unknown time  . albuterol (PROVENTIL) (2.5 MG/3ML) 0.083% nebulizer solution Take 3 mLs (2.5 mg total) by nebulization every 6 (six) hours as needed for wheezing. (Patient not taking: Reported on 10/05/2014) 75 mL 11 Not Taking at Unknown time  . [DISCONTINUED] camphor-menthol (SARNA) lotion Apply topically  as needed for itching. (Patient not taking: Reported on 10/05/2014) 222 mL 0 Not Taking at Unknown time  . [DISCONTINUED] oxyCODONE-acetaminophen (PERCOCET/ROXICET) 5-325 MG per tablet Take 1 tablet by mouth every 8 (eight) hours as needed for moderate pain or severe pain. (Patient not taking: Reported on 10/05/2014) 30 tablet 0 Not Taking at Unknown time  . [DISCONTINUED] piperacillin-tazobactam (ZOSYN) 3.375 GM/50ML IVPB Inject 50 mLs (3.375 g total) into the vein every 8 (eight) hours. (Patient not taking: Reported on 10/05/2014) 50 mL  Not Taking at Unknown time  . [DISCONTINUED] terbinafine (LAMISIL) 1 % cream Apply 1 application topically 2 (two) times daily.   Not Taking at Unknown time    Assessment: 49 y.o. male presents with body wide rash - just discharged yesterday. Pt on coumadin  PTA for recurrent DVT, PE (last 5 months ago) - pt was on Xarelto originally and attributes his rash to this. Surgery consulted for the rash and punch biopsy performed. To continue coumadin while in hospital. INR therapeutic on admit (2.58) - noted that INR was 2 yesterday before discharge.  Planned home dose: 15mg  daily except for 12.5mg  on Thur and Fri  Goal of Therapy:  INR 2-3 Monitor platelets by anticoagulation protocol: Yes   Plan:  Coumadin 12.5mg  po tonight Daily INR  Sherlon Handing, PharmD, BCPS Clinical pharmacist, pager [phone] 10/05/2014,4:41 PM

## 2014-10-05 NOTE — Procedures (Signed)
Skin punch biopsy Procedure Note  Pre-operative Diagnosis: desquamativa process of skin  Post-operative Diagnosis: same  Indications: This is a 49 yo black male who was diagnosed with a DVT and PE about 5 months ago and was placed on xarelto.  He feels he had some reaction to the xarelto and began having this"rash."  He has been followed between here and [facility] over the last 5 months as he used to live in [place].  His rash has since progressed to a desquamativa process.  He had a biopsy at [facility] that he states returned as eczema.  He had been readmitted again today for worsening of his condition.  We have been asked to perform another skin biopsy.  Anesthesia: 1% plain lidocaine  Procedure Details  The procedure, risks and complications have been discussed in detail (including, but not limited to infection, bleeding) with the patient, and the patient has given verbal consent to the procedure.  The skin was sterilely prepped over the affected area in the usual fashion. After adequate local anesthesia, a 43mm punch was used to excise a small biopsy of skin from his left upper medial thigh.  Part health and part unhealthy tissue was obtained.   The patient was observed until stable.  Findings: See above description  EBL: minimal   Condition: Tolerated procedure well and Stable   Complications: none.  Eleftheria Taborn E 10/05/2014 4:32 PM

## 2014-10-05 NOTE — Progress Notes (Signed)
Called to receive report. ED  RN currently unavailable and will call 6E back. Vayden Weinand, [place]n Gulling
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
## 2014-10-05 NOTE — ED Notes (Signed)
Admitting PA at bedside.
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
## 2014-10-05 NOTE — ED Notes (Signed)
Pt reports rash all over body from xarelto for 5 months. Skin is itchy, and flaking. Also both ears were draining this morning and right leg swollen.

## 2014-10-06 DIAGNOSIS — Z720 Tobacco use: Secondary | ICD-10-CM

## 2014-10-06 DIAGNOSIS — I82501 Chronic embolism and thrombosis of unspecified deep veins of right lower extremity: Secondary | ICD-10-CM

## 2014-10-06 DIAGNOSIS — L03119 Cellulitis of unspecified part of limb: Secondary | ICD-10-CM

## 2014-10-06 LAB — COMPREHENSIVE METABOLIC PANEL
ALK PHOS: 74 U/L (ref 39–117)
ALT: 22 U/L (ref 0–53)
AST: 23 U/L (ref 0–37)
Albumin: 2.7 g/dL — ABNORMAL LOW (ref 3.5–5.2)
Anion gap: 9 (ref 5–15)
BILIRUBIN TOTAL: 0.4 mg/dL (ref 0.3–1.2)
BUN: 14 mg/dL (ref 6–23)
CALCIUM: 9 mg/dL (ref 8.4–10.5)
CO2: 25 mmol/L (ref 19–32)
Chloride: 107 mmol/L (ref 96–112)
Creatinine, Ser: 1.26 mg/dL (ref 0.50–1.35)
GFR calc Af Amer: 76 mL/min — ABNORMAL LOW (ref >90)
GFR calc non Af Amer: 66 mL/min — ABNORMAL LOW (ref >90)
Glucose, Bld: 119 mg/dL — ABNORMAL HIGH (ref 70–99)
Potassium: 4.4 mmol/L (ref 3.5–5.1)
Sodium: 141 mmol/L (ref 135–145)
Total Protein: 5.2 g/dL — ABNORMAL LOW (ref 6.0–8.3)

## 2014-10-06 LAB — PROTIME-INR
INR: 2.66 — AB (ref 0.00–1.49)
Prothrombin Time: 28.5 seconds — ABNORMAL HIGH (ref 11.6–15.2)

## 2014-10-06 MED ORDER — WARFARIN SODIUM 2.5 MG PO TABS
12.5000 mg | ORAL_TABLET | Freq: Once | ORAL | Status: AC
  Administered 2014-10-06: 12.5 mg via ORAL
  Filled 2014-10-06: qty 1
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
## 2014-10-06 NOTE — Progress Notes (Signed)
ANTICOAGULATION CONSULT NOTE - FOLLOW UP  Pharmacy Consult:  Coumadin Indication: h/o DVT, PE (5 months ago)  Allergies  Allergen Reactions  . Xarelto [Rivaroxaban] Dermatitis    Blisters skin  . Clindamycin/Lincomycin Dermatitis    Severe rash/dermatitis from November 2015 still present Jan 2016 from clinda    Patient Measurements: Height: 6\' 4"  (193 cm) Weight: 235 lb (106.595 kg) IBW/kg (Calculated) : 86.8  Vital Signs: Temp: 98.3 F (36.8 C) (03/29 0516) Temp Source: Oral (03/29 0516) BP: 114/54 mmHg (03/29 0516) Pulse Rate: 74 (03/29 0516)  Labs:  Recent Labs  10/04/14 0650 10/05/14 0920 10/06/14 0554  HGB  --  14.4  --   HCT  --  44.5  --   PLT  --  321  --   LABPROT 22.9* 27.9*  --   INR 2.00* 2.58*  --   CREATININE 1.32 1.64* 1.26    Estimated Creatinine Clearance: 96 mL/min (by C-G formula based on Cr of 1.26).    Assessment: 51 YOM presented with progressively worsening body-wide rash.  Patient was previously on Xarelto for a recent history of DVT and PE (5 months ago) and rash was attributed to Xarelto.  Currently on Coumadin and INR remains therapeutic.  No bleeding reported.  Planned home dose: 15mg  daily except for 12.5mg  on Thur and Fri   Goal of Therapy:  INR 2-3    Plan:  - Repeat Coumadin 12.5mg  PO today - Daily PT / INR - F/U punch biopsy result    Anylah Scheib D. Mina Marble, PharmD, BCPS Pager:  [phone] 10/06/2014, 11:28 AM

## 2014-10-06 NOTE — Evaluation (Signed)
Occupational Therapy Evaluation Patient Details Name: [name] [name] MRN: [id number] DOB: 01-27-1966 Today's Date: 10/06/2014    History of Present Illness This is a 49 yo black male who was diagnosed with a DVT and PE about 5 months ago and was placed on xarelto. He feels he had some reaction to the xarelto and began having this"rash." He has been followed between here and [facility] over the last 5 months as he used to live in [place]. His rash has since progressed to a desquamativa process. He had a biopsy at [facility] that he states returned as eczema. He had been readmitted again for worsening of his condition.   Clinical Impression   Pt with decline in function and safety with ADLs and ADL mobility with pain during any movement for ADLs. Pt with Good strength and ROM for functional tasks, however painful rash with cracked skin/bleeding over UE and LEs hinders performance. Pt would benefit form acute OT services to address impairments to increase level of function and safety     Follow Up Recommendations  No OT follow up;Supervision - Intermittent    Equipment Recommendations  None recommended by OT (ADL A/E, reacher)    Recommendations for Other Services       Precautions / Restrictions Precautions Precaution Comments: Contact MRSA Restrictions Weight Bearing Restrictions: No      Mobility Bed Mobility Overal bed mobility: Independent                Transfers Overall transfer level: Needs assistance Equipment used: None Transfers: Stand Pivot Transfers   Stand pivot transfers: Supervision       General transfer comment: Noted heavy reliance on UEs to support self during transition to chair; supervision mostly for IV pole management    Balance Overall balance assessment: No apparent balance deficits (not formally assessed)                                          ADL Overall ADL's : Needs assistance/impaired     Grooming: Wash/dry  hands;Wash/dry face;Set up Grooming Details (indicate cue type and reason): painful for any movement in UEs and LEs Upper Body Bathing: Supervision/ safety;Set up Upper Body Bathing Details (indicate cue type and reason): painful for any movement in UEs and LEs Lower Body Bathing: Minimal assistance Lower Body Bathing Details (indicate cue type and reason): painful for any movement in UEs and LEs Upper Body Dressing : Supervision/safety;Set up Upper Body Dressing Details (indicate cue type and reason): painful for any movement in UEs and LEs Lower Body Dressing: Maximal assistance;Bed level (donning socks, painful) Lower Body Dressing Details (indicate cue type and reason): painful for any movement in UEs and LEs Toilet Transfer: Stand-pivot;Supervision/safety (uses urinal too) Toilet Transfer Details (indicate cue type and reason): painful for any movement in UEs and LEs Toileting- Clothing Manipulation and Hygiene: Supervision/safety (uses urinal) Toileting - Clothing Manipulation Details (indicate cue type and reason): painful for any movement in UEs and LEs     Functional mobility during ADLs: Supervision/safety General ADL Comments: painful for any movement in UEs and LEs     Vision  reading glasses   Perception Perception Perception Tested?: No   Praxis Praxis Praxis tested?: Not tested    Pertinent Vitals/Pain Pain Assessment: 0-10 Pain Score: 8  Pain Location: generalized pain, cracks in plantar surfaces of feet and palms Pain Descriptors / Indicators: Aching;Burning;Constant;Tightness;Penetrating  Pain Intervention(s): Monitored during session;Premedicated before session;Repositioned     Hand Dominance Right   Extremity/Trunk Assessment Upper Extremity Assessment Upper Extremity Assessment: Overall WFL for tasks assessed (unable to do MMT due to pain in hands/UEs)   Lower Extremity Assessment Lower Extremity Assessment: Defer to PT evaluation   Cervical / Trunk  Assessment Cervical / Trunk Assessment: Normal   Communication Communication Communication: No difficulties   Cognition Arousal/Alertness: Awake/alert Behavior During Therapy: WFL for tasks assessed/performed Overall Cognitive Status: Within Functional Limits for tasks assessed                     General Comments   pt pleasant and cooperative                 Home Living Family/patient expects to be discharged to:: Private residence Living Arrangements: Other relatives Available Help at Discharge: Family;Available PRN/intermittently Type of Home: House Home Access: Level entry;Stairs to enter Entrance Stairs-Number of Steps: 5   Home Layout: Multi-level Alternate Level Stairs-Number of Steps: 18   Bathroom Shower/Tub: Teacher, early years/pre: Standard     Home Equipment: None   Additional Comments: Pt's mother lives down the street from him; It is worth considering seeing if she can assist him with ADLs when it is particularly painful to manage      Prior Functioning/Environment Level of Independence: Independent        Comments: Pain and body rash past 5 months    OT Diagnosis: Acute pain   OT Problem List: Decreased knowledge of use of DME or AE;Decreased activity tolerance;Pain   OT Treatment/Interventions: Self-care/ADL training;DME and/or AE instruction;Therapeutic activities;Patient/family education    OT Goals(Current goals can be found in the care plan section) Acute Rehab OT Goals Patient Stated Goal: Wants rash to stop OT Goal Formulation: With patient Time For Goal Achievement: 10/13/14 Potential to Achieve Goals: Good ADL Goals Pt Will Perform Lower Body Bathing: with min guard assist;with supervision;with set-up Pt Will Perform Lower Body Dressing: with adaptive equipment;with mod assist;with min assist Pt Will Transfer to Toilet: with modified independence Pt Will Perform Toileting - Clothing Manipulation and hygiene: with  modified independence  OT Frequency: Min 2X/week   Barriers to D/C: Decreased caregiver support  pt lives at home alone       Co-evaluation   Reason for Co-Treatment: Other (comment) (decreased activity tolerance) PT goals addressed during session: Mobility/safety with mobility        End of Session Nurse Communication: Mobility status  Activity Tolerance: Patient limited by pain Patient left: in chair;with call bell/phone within reach   Time: 0511-0211 OT Time Calculation (min): 35 min Charges:  OT General Charges $OT Visit: 1 Procedure OT Evaluation $Initial OT Evaluation Tier I: 1 Procedure G-Codes:    Britt Bottom 10/06/2014, 2:25 PM

## 2014-10-06 NOTE — Progress Notes (Signed)
Patient was referred for social work consult: Insurance underwriter. Social worker does not complete this task and this should be be referred for financial counseling as patient does not have insurance: medicaid pending.  LCSW spoke with [facility] rep int he ED who is aware of patient and met with him in the ED prior to being inpatient. LCSW obtained packet for patient to complete and follow up once discharged so he can obtain an orange card and receive access to care.  All information left at the bedside for patient to review and complete.  If patient has questions he was given contact information of F. [facility].    Lane Hacker, MSW Clinical Social Work: Emergency Room [phone]

## 2014-10-06 NOTE — Progress Notes (Signed)
PROGRESS NOTE  DOCTOR SHEAHAN HFW:[id number] DOB: May 26, 1966 DOA: 10/05/2014 PCP: No PCP Per Patient  Assessment/Plan: Sepsis Due to tachycardia and increased respirations. Source is likely body wide rash. Giving IV fluids, Doxycycline, and obtaining another set of blood cultures (all previous have been negative)  Body wide rash (picture in chart from 3/22) Arms/trunk/legs, and now face, ears & even his tongue is erythematous Admitting dr discussed with [facility]. Potential transfer to hospitalist service with inpatient dermatology consultation. - Spoke with Dr. Kerby Less. Unfortunately, there is a long wait list for transfer and it may be days to weeks. If the patient decompensates and needs ICU care [facility] will consider expedited transfer. s/p general surgery for skin biopsy. Place the patient on doxycycline and Solu-Medrol. Supportive care including Benadryl, Pepcid, Phenergan, pain medication. Clobetasol to affected areas. 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 INR therapeutic.  Tobacco abuse.  Patient declines nicotine patch.  Asthma.  Will continue albuterol inhaler, nebulizer.  Code Status: full Family Communication: patient Disposition Plan:    Consultants:  General surgery  Procedures:  Punch biopsy    HPI/Subjective: Still c/o pain in LE  Objective: Filed Vitals:   10/06/14 0516  BP: 114/54  Pulse: 74  Temp: 98.3 F (36.8 C)  Resp: 18    Intake/Output Summary (Last 24 hours) at 10/06/14 0850 Last data filed at 10/06/14 0629  Gross per 24 hour  Intake    716 ml  Output    300 ml  Net    416 ml   Filed Weights   10/05/14 0823 10/05/14 2109  Weight: 103.874 kg (229 lb) 106.595 kg (235 lb)    Exam:   General:  A+Ox3, NAD  Cardiovascular: rrr  Respiratory: clear  Abdomen: +BS, soft Skin:  Body wide rash. RLE  is dark in color, swollen, peeling- areas with covered with white bandage, LLE is erythematous, peeling and swollen, with seepage from split skin, Trunk and back are erythematous. Arms and hands are peeling   Data Reviewed: Basic Metabolic Panel:  Recent Labs Lab 10/02/14 0729 10/04/14 0650 10/05/14 0920 10/06/14 0554  NA 140 141 144 141  K 4.0 4.2 4.1 4.4  CL 101 102 105 107  CO2 29 30 27 25   GLUCOSE 116* 105* 144* 119*  BUN 10 14 10 14   CREATININE 1.20 1.32 1.64* 1.26  CALCIUM 9.0 8.6 8.7 9.0   Liver Function Tests:  Recent Labs Lab 10/04/14 0650 10/05/14 0920 10/06/14 0554  AST 38* 24 23  ALT 26 26 22   ALKPHOS 68 59 74  BILITOT 0.9 0.5 0.4  PROT 5.7* 5.6* 5.2*  ALBUMIN 2.8* 2.7* 2.7*   No results for input(s): LIPASE, AMYLASE in the last 168 hours. No results for input(s): AMMONIA in the last 168 hours. CBC:  Recent Labs Lab 10/05/14 0920  WBC 7.6  NEUTROABS 5.3  HGB 14.4  HCT 44.5  MCV 97.2  PLT 321   Cardiac Enzymes: No results for input(s): CKTOTAL, CKMB, CKMBINDEX, TROPONINI in the last 168 hours. BNP (last 3 results) No results for input(s): BNP in the last 8760 hours.  ProBNP (last 3 results) No results for input(s): PROBNP in the last 8760 hours.  CBG: No results for input(s): GLUCAP in the last 168 hours.  Recent Results (from the past 240 hour(s))  Culture, blood (routine x 2)     Status:  None   Collection Time: 09/28/14 12:50 AM  Result Value Ref Range Status   Specimen Description BLOOD RIGHT FOREARM  Final   Special Requests BOTTLES DRAWN AEROBIC AND ANAEROBIC 5CC   Final   Culture   Final    NO GROWTH 5 DAYS Performed at [facility]    Report Status 10/04/2014 FINAL  Final  Culture, blood (routine x 2)     Status: None   Collection Time: 09/28/14  1:27 AM  Result Value Ref Range Status   Specimen Description BLOOD LEFT ARM  Final   Special Requests BOTTLES DRAWN AEROBIC AND ANAEROBIC 5CC  Final   Culture   Final     NO GROWTH 5 DAYS Performed at [facility]    Report Status 10/04/2014 FINAL  Final  MRSA PCR Screening     Status: Abnormal   Collection Time: 09/28/14  3:17 AM  Result Value Ref Range Status   MRSA by PCR POSITIVE (A) NEGATIVE Final    Comment:        The GeneXpert MRSA Assay (FDA approved for NASAL specimens only), is one component of a comprehensive MRSA colonization surveillance program. It is not intended to diagnose MRSA infection nor to guide or monitor treatment for MRSA infections. RESULT CALLED TO, READ BACK BY AND VERIFIED WITH: STOWE,S RN [phone] ON 09/28/14 MITCHELL,L      Studies: No results found.  Scheduled Meds: . clobetasol cream   Topical BID  . doxycycline  100 mg Oral Q12H  . famotidine  40 mg Oral Daily  . feeding supplement (ENSURE)  1 Container Oral TID BM  . hydrocerin  1 application Topical BID  . methylPREDNISolone (SOLU-MEDROL) injection  40 mg Intravenous Q12H  . sodium chloride  3 mL Intravenous Q12H  . Warfarin - Pharmacist Dosing Inpatient   Does not apply q1800   Continuous Infusions: . sodium chloride 100 mL/hr at 10/06/14 0043   Antibiotics Given (last 72 hours)    Date/Time Action Medication Dose   10/05/14 2133 Given   doxycycline (VIBRA-TABS) tablet 100 mg 100 mg      Principal Problem:   Sepsis Active Problems:   Asthma, chronic   GERD   Long-term (current) use of anticoagulants   Leg DVT (deep venous thromboembolism), chronic   Cellulitis   Tobacco abuse   Tachycardia   Acute on chronic renal failure   Rash and nonspecific skin eruption    Time spent: 35 min    [name] [name]  [facility] Pager [phone]. If 7PM-7AM, please contact night-coverage at [email or web address], password [facility] 10/06/2014, 8:50 AM  LOS: 1 day

## 2014-10-06 NOTE — Progress Notes (Signed)
Patient ID: [name] [name], male   DOB: 1965-08-31, 49 y.o.   MRN: [id number]    Subjective: C/o pain everywhere   Objective: Vital signs in last 24 hours: Temp:  [98.1 F (36.7 C)-98.6 F (37 C)] 98.3 F (36.8 C) (03/29 0516) Pulse Rate:  [74-110] 74 (03/29 0516) Resp:  [16-22] 18 (03/29 0516) BP: (114-134)/(54-83) 114/54 mmHg (03/29 0516) SpO2:  [94 %-99 %] 99 % (03/29 0516) Weight:  [106.595 kg (235 lb)] 106.595 kg (235 lb) (03/28 2109) Last BM Date: 10/04/14  Intake/Output from previous day: 03/28 0701 - 03/29 0700 In: 716 [P.O.:716] Out: 300 [Urine:300] Intake/Output this shift:    PE: Skin: punch biopsy site is healing well with no bleeding.  Dry dressing to cover wound  Lab Results:   Recent Labs  10/05/14 0920  WBC 7.6  HGB 14.4  HCT 44.5  PLT 321   BMET  Recent Labs  10/05/14 0920 10/06/14 0554  NA 144 141  K 4.1 4.4  CL 105 107  CO2 27 25  GLUCOSE 144* 119*  BUN 10 14  CREATININE 1.64* 1.26  CALCIUM 8.7 9.0   PT/INR  Recent Labs  10/05/14 0920 10/06/14 0845  LABPROT 27.9* 28.5*  INR 2.58* 2.66*   CMP     Component Value Date/Time   NA 141 10/06/2014 0554   K 4.4 10/06/2014 0554   CL 107 10/06/2014 0554   CO2 25 10/06/2014 0554   GLUCOSE 119* 10/06/2014 0554   BUN 14 10/06/2014 0554   CREATININE 1.26 10/06/2014 0554   CALCIUM 9.0 10/06/2014 0554   PROT 5.2* 10/06/2014 0554   ALBUMIN 2.7* 10/06/2014 0554   AST 23 10/06/2014 0554   ALT 22 10/06/2014 0554   ALKPHOS 74 10/06/2014 0554   BILITOT 0.4 10/06/2014 0554   GFRNONAA 66* 10/06/2014 0554   GFRAA 76* 10/06/2014 0554   Lipase     Component Value Date/Time   LIPASE 99* 09/28/2014 0050       Studies/Results: No results found.  Anti-infectives: Anti-infectives    Start     Dose/Rate Route Frequency Ordered Stop   10/05/14 2200  doxycycline (VIBRA-TABS) tablet 100 mg     100 mg Oral Every 12 hours 10/05/14 1633         Assessment/Plan  1. desquamatizing skin  process, unknown etiology, ? Eczema  -punch biopsy completed. pathology pending. -will defer further care to primary service.  We will sign off.  LOS: 1 day    Humza Tallerico E 10/06/2014, 10:01 AM Pager: [phone]

## 2014-10-07 DIAGNOSIS — Z7901 Long term (current) use of anticoagulants: Secondary | ICD-10-CM

## 2014-10-07 LAB — PROTIME-INR
INR: 2.4 — ABNORMAL HIGH (ref 0.00–1.49)
PROTHROMBIN TIME: 26.3 s — AB (ref 11.6–15.2)

## 2014-10-07 MED ORDER — WARFARIN SODIUM 7.5 MG PO TABS
15.0000 mg | ORAL_TABLET | Freq: Once | ORAL | Status: AC
  Administered 2014-10-07: 15 mg via ORAL
  Filled 2014-10-07: qty 2

## 2014-10-07 MED ORDER — CLOBETASOL PROPIONATE 0.05 % EX OINT
TOPICAL_OINTMENT | Freq: Two times a day (BID) | CUTANEOUS | Status: DC
  Administered 2014-10-07 – 2014-10-08 (×3): via TOPICAL
  Filled 2014-10-07 (×2): qty 60

## 2014-10-07 NOTE — Progress Notes (Signed)
ANTICOAGULATION CONSULT NOTE - FOLLOW UP  Pharmacy Consult:  Coumadin Indication: h/o DVT, PE (5 months ago)  Allergies  Allergen Reactions  . Xarelto [Rivaroxaban] Dermatitis    Blisters skin  . Clindamycin/Lincomycin Dermatitis    Severe rash/dermatitis from November 2015 still present Jan 2016 from clinda    Patient Measurements: Height: 6\' 4"  (193 cm) Weight: 233 lb 4 oz (105.8 kg) IBW/kg (Calculated) : 86.8  Vital Signs: Temp: 98.3 F (36.8 C) (03/30 1020) Temp Source: Oral (03/30 1020) BP: 129/80 mmHg (03/30 1020) Pulse Rate: 61 (03/30 1020)  Labs:  Recent Labs  10/05/14 0920 10/06/14 0554 10/06/14 0845 10/07/14 0431  HGB 14.4  --   --   --   HCT 44.5  --   --   --   PLT 321  --   --   --   LABPROT 27.9*  --  28.5* 26.3*  INR 2.58*  --  2.66* 2.40*  CREATININE 1.64* 1.26  --   --     Estimated Creatinine Clearance: 95.7 mL/min (by C-G formula based on Cr of 1.26).    Assessment: 4 YOM presented with progressively worsening body-wide rash.  Patient was previously on Xarelto for a recent history of DVT and PE (5 months ago) and rash was attributed to Xarelto. The patient was transitioned to warfarin - PTA dose was 15 mg daily EXCEPT for 12.5 mg on Thurs/Fri.    INR today remains therapeutic (INR 2.4 << 2.66, goal of 2-3). No CBC today - stable from 3/28 labs. No overt s/sx of bleeding noted.   Goal of Therapy:  INR 2-3  Plan:  1. Warfarin 15 mg x 1 dose at 1800 today 2. Will continue to monitor for any signs/symptoms of bleeding and will follow up with PT/INR in the a.m.   Alycia Rossetti, PharmD, BCPS Clinical Pharmacist Pager: [phone] 10/07/2014 11:54 AM

## 2014-10-07 NOTE — Progress Notes (Signed)
Physical Therapy Treatment and Discharge Note Patient Details Name: [name] [name] MRN: [id number] DOB: 30-Aug-1965 Today's Date: 10/07/2014    History of Present Illness This is a 49 yo black male who was diagnosed with a DVT and PE about 5 months ago and was placed on xarelto. He feels he had some reaction to the xarelto and began having this"rash." He has been followed between here and [facility] over the last 5 months as he used to live in [place]. His rash has since progressed to a desquamativa process. He had a biopsy at [facility] that he states returned as eczema. He had been readmitted again for worsening of his condition.    PT Comments    Overall moving well despite pain from generalized eczema with noted skin cracking in feet, right foot especially; Using walker well to unweigh painful feet; Modified independent with mobility; no further acute PT needs noted;   Would recommend pt have his mother's help for ADLs when discharged from hospital.  Follow Up Recommendations  Supervision - Intermittent;No PT follow up (possibly HHRN for chronic disease management)     Equipment Recommendations  Rolling walker with 5" wheels    Recommendations for Other Services       Precautions / Restrictions Precautions Precaution Comments: Contact MRSA    Mobility  Bed Mobility Overal bed mobility: Independent                Transfers Overall transfer level: Modified independent Equipment used: None Transfers: Sit to/from Stand Sit to Stand: Modified independent (Device/Increase time)         General transfer comment: slow and painful  Ambulation/Gait Ambulation/Gait assistance: Supervision;Modified independent (Device/Increase time) Ambulation Distance (Feet): 170 Feet Assistive device: Rolling walker (2 wheeled) Gait Pattern/deviations: Step-through pattern Gait velocity: slow and painful   General Gait Details: Supervision progressing to modified independent; using RW well  to take weight off of painful feet   Stairs            Wheelchair Mobility    Modified Rankin (Stroke Patients Only)       Balance Overall balance assessment: No apparent balance deficits (not formally assessed)                                  Cognition Arousal/Alertness: Awake/alert Behavior During Therapy: WFL for tasks assessed/performed Overall Cognitive Status: Within Functional Limits for tasks assessed                      Exercises      General Comments        Pertinent Vitals/Pain Pain Assessment: 0-10 Pain Score: 6  Pain Location: Generalized Pain Descriptors / Indicators: Aching;Discomfort Pain Intervention(s): Premedicated before session    Home Living                      Prior Function            PT Goals (current goals can now be found in the care plan section) Acute Rehab PT Goals Patient Stated Goal: Wants rash to stop PT Goal Formulation: All assessment and education complete, DC therapy Progress towards PT goals: Goals met/education completed, patient discharged from PT (Majority of goals met)    Frequency  Min 3X/week    PT Plan Current plan remains appropriate    Co-evaluation  End of Session   Activity Tolerance: Patient tolerated treatment well Patient left: in chair;with call bell/phone within reach     Time: 2103-1281 PT Time Calculation (min) (ACUTE ONLY): 17 min  Charges:  $Gait Training: 8-22 mins                    G Codes:      [name] 10/07/2014, 4:39 PM  [name], [place] Pager [phone] Office [phone]

## 2014-10-07 NOTE — Progress Notes (Signed)
PROGRESS NOTE  [name] [name] HYI:[id number] DOB: [birth year] DOA: 10/05/2014 PCP: No PCP Per Patient  Assessment/Plan: Sepsis Source is likely body wide rash -resolved Giving IV fluids, Doxycycline, and obtaining another set of blood cultures (all previous have been negative)  Body wide rash (picture in chart from 3/22) Arms/trunk/legs, and now face, ears & even his tongue is erythematous Admitting dr discussed with [facility]. Potential transfer to hospitalist service with inpatient dermatology consultation- had been diagnosed with severe eczema on 1st biopsy per patient  s/p general surgery consult for skin biopsy. Place the patient on doxycycline - d/c solumedrol -Supportive care including Benadryl, Pepcid, Phenergan, pain medication. 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 INR therapeutic.  Tobacco abuse.  Patient declines nicotine patch.  Asthma.  Will continue albuterol inhaler, nebulizer.  Code Status: full Family Communication: patient Disposition Plan: ?   Consultants:  General surgery  Procedures:  Punch biopsy    HPI/Subjective: Says skin "hurts"  Objective: Filed Vitals:   10/07/14 0500  BP: 116/88  Pulse: 67  Temp: 97.5 F (36.4 C)  Resp: 18    Intake/Output Summary (Last 24 hours) at 10/07/14 1010 Last data filed at 10/07/14 0700  Gross per 24 hour  Intake 4186.67 ml  Output   1450 ml  Net 2736.67 ml   Filed Weights   10/05/14 0823 10/05/14 2109 10/06/14 2048  Weight: 103.874 kg (229 lb) 106.595 kg (235 lb) 105.8 kg (233 lb 4 oz)    Exam:   General:  A+Ox3, NAD  Cardiovascular: rrr  Respiratory: clear  Abdomen: +BS, soft Skin:  Body wide rash. RLE is dark in color, swollen (chronically per patient), peeling- areas with covered with white bandage, LLE is erythematous, peeling and swollen.  Arms and hands are peeling   Data Reviewed: Basic Metabolic Panel:  Recent Labs Lab 10/02/14 0729 10/04/14 0650 10/05/14 0920 10/06/14 0554  NA 140 141 144 141  K 4.0 4.2 4.1 4.4  CL 101 102 105 107  CO2 29 30 27 25   GLUCOSE 116* 105* 144* 119*  BUN 10 14 10 14   CREATININE 1.20 1.32 1.64* 1.26  CALCIUM 9.0 8.6 8.7 9.0   Liver Function Tests:  Recent Labs Lab 10/04/14 0650 10/05/14 0920 10/06/14 0554  AST 38* 24 23  ALT 26 26 22   ALKPHOS 68 59 74  BILITOT 0.9 0.5 0.4  PROT 5.7* 5.6* 5.2*  ALBUMIN 2.8* 2.7* 2.7*   No results for input(s): LIPASE, AMYLASE in the last 168 hours. No results for input(s): AMMONIA in the last 168 hours. CBC:  Recent Labs Lab 10/05/14 0920  WBC 7.6  NEUTROABS 5.3  HGB 14.4  HCT 44.5  MCV 97.2  PLT 321   Cardiac Enzymes: No results for input(s): CKTOTAL, CKMB, CKMBINDEX, TROPONINI in the last 168 hours. BNP (last 3 results) No results for input(s): BNP in the last 8760 hours.  ProBNP (last 3 results) No results for input(s): PROBNP in the last 8760 hours.  CBG: No results for input(s): GLUCAP in the last 168 hours.  Recent Results (from the past 240 hour(s))  Culture, blood (routine x 2)     Status: None   Collection Time: 09/28/14 12:50 AM  Result Value Ref Range Status   Specimen Description BLOOD RIGHT FOREARM  Final   Special Requests BOTTLES DRAWN AEROBIC AND ANAEROBIC 5CC  Final   Culture   Final    NO GROWTH 5 DAYS Performed at [facility]    Report Status 10/04/2014 FINAL  Final  Culture, blood (routine x 2)     Status: None   Collection Time: 09/28/14  1:27 AM  Result Value Ref Range Status   Specimen Description BLOOD LEFT ARM  Final   Special Requests BOTTLES DRAWN AEROBIC AND ANAEROBIC 5CC  Final   Culture   Final    NO GROWTH 5 DAYS Performed at [facility]    Report Status 10/04/2014 FINAL  Final  MRSA PCR Screening     Status: Abnormal   Collection Time: 09/28/14  3:17 AM    Result Value Ref Range Status   MRSA by PCR POSITIVE (A) NEGATIVE Final    Comment:        The GeneXpert MRSA Assay (FDA approved for NASAL specimens only), is one component of a comprehensive MRSA colonization surveillance program. It is not intended to diagnose MRSA infection nor to guide or monitor treatment for MRSA infections. RESULT CALLED TO, READ BACK BY AND VERIFIED WITH: STOWE,S RN [phone] ON 09/28/14 MITCHELL,L   Culture, blood (routine x 2)     Status: None (Preliminary result)   Collection Time: 10/05/14  7:00 PM  Result Value Ref Range Status   Specimen Description BLOOD LEFT ANTECUBITAL  Final   Special Requests BOTTLES DRAWN AEROBIC AND ANAEROBIC 5CC  Final   Culture   Final           BLOOD CULTURE RECEIVED NO GROWTH TO DATE CULTURE WILL BE HELD FOR 5 DAYS BEFORE ISSUING A FINAL NEGATIVE REPORT Performed at [facility]    Report Status PENDING  Incomplete  Culture, blood (routine x 2)     Status: None (Preliminary result)   Collection Time: 10/05/14  7:15 PM  Result Value Ref Range Status   Specimen Description BLOOD LEFT HAND  Final   Special Requests BOTTLES DRAWN AEROBIC AND ANAEROBIC 5CC  Final   Culture   Final           BLOOD CULTURE RECEIVED NO GROWTH TO DATE CULTURE WILL BE HELD FOR 5 DAYS BEFORE ISSUING A FINAL NEGATIVE REPORT Performed at [facility]    Report Status PENDING  Incomplete     Studies: No results found.  Scheduled Meds: . clobetasol cream   Topical BID  . doxycycline  100 mg Oral Q12H  . famotidine  40 mg Oral Daily  . feeding supplement (ENSURE)  1 Container Oral TID BM  . hydrocerin  1 application Topical BID  . methylPREDNISolone (SOLU-MEDROL) injection  40 mg Intravenous Q12H  . sodium chloride  3 mL Intravenous Q12H  . Warfarin - Pharmacist Dosing Inpatient   Does not apply q1800   Continuous Infusions: . sodium chloride 100 mL/hr at 10/07/14 0121   Antibiotics Given (last 72 hours)    Date/Time Action  Medication Dose   10/05/14 2133 Given   doxycycline (VIBRA-TABS) tablet 100 mg 100 mg   10/06/14 1158 Given   doxycycline (VIBRA-TABS) tablet 100 mg 100 mg   10/06/14 2226 Given   doxycycline (VIBRA-TABS) tablet 100 mg 100 mg      Principal Problem:   Sepsis Active Problems:   Asthma, chronic   GERD   Long-term (current) use of anticoagulants   Leg DVT (deep venous thromboembolism), chronic   Cellulitis   Tobacco abuse   Tachycardia   Acute on chronic  renal failure   Rash and nonspecific skin eruption    Time spent: 25 min    Julian Medina  [facility] Pager [phone]. If 7PM-7AM, please contact night-coverage at [email or web address], password [facility] 10/07/2014, 10:10 AM  LOS: 2 days

## 2014-10-07 NOTE — Progress Notes (Signed)
10/07/2014 6:04 AM  [facility] transfer team called asking if patient still was needing transportation. Stated to him that per MD notes patient still needed to be transferred when a bed was available at [facility]. He stated no bed was available yet, but if his condition turned worst to alert the [facility] transfer team. Will relay this message to oncoming day shift team. Will continue to monitor and assess the patient.   [employer], RN-BC, RN3 [facility] 6 [employer] [phone]

## 2014-10-08 DIAGNOSIS — J4520 Mild intermittent asthma, uncomplicated: Secondary | ICD-10-CM

## 2014-10-08 DIAGNOSIS — L309 Dermatitis, unspecified: Secondary | ICD-10-CM

## 2014-10-08 LAB — CBC
HCT: 38.8 % — ABNORMAL LOW (ref 39.0–52.0)
Hemoglobin: 12.3 g/dL — ABNORMAL LOW (ref 13.0–17.0)
MCH: 31 pg (ref 26.0–34.0)
MCHC: 31.7 g/dL (ref 30.0–36.0)
MCV: 97.7 fL (ref 78.0–100.0)
Platelets: 287 10*3/uL (ref 150–400)
RBC: 3.97 MIL/uL — ABNORMAL LOW (ref 4.22–5.81)
RDW: 14.3 % (ref 11.5–15.5)
WBC: 17.7 10*3/uL — AB (ref 4.0–10.5)

## 2014-10-08 LAB — BASIC METABOLIC PANEL
ANION GAP: 7 (ref 5–15)
BUN: 15 mg/dL (ref 6–23)
CALCIUM: 9 mg/dL (ref 8.4–10.5)
CO2: 26 mmol/L (ref 19–32)
Chloride: 108 mmol/L (ref 96–112)
Creatinine, Ser: 1.14 mg/dL (ref 0.50–1.35)
GFR calc Af Amer: 86 mL/min — ABNORMAL LOW (ref >90)
GFR calc non Af Amer: 74 mL/min — ABNORMAL LOW (ref >90)
Glucose, Bld: 135 mg/dL — ABNORMAL HIGH (ref 70–99)
Potassium: 4 mmol/L (ref 3.5–5.1)
SODIUM: 141 mmol/L (ref 135–145)

## 2014-10-08 LAB — PROTIME-INR
INR: 2.79 — AB (ref 0.00–1.49)
Prothrombin Time: 29.6 seconds — ABNORMAL HIGH (ref 11.6–15.2)

## 2014-10-08 MED ORDER — HYDROCODONE-ACETAMINOPHEN 5-325 MG PO TABS: 1.0000 | ORAL_TABLET | ORAL | Status: DC | PRN

## 2014-10-08 MED ORDER — CLOBETASOL PROPIONATE 0.05 % EX CREA: 1.0000 "application " | TOPICAL_CREAM | Freq: Two times a day (BID) | CUTANEOUS | Status: DC

## 2014-10-08 NOTE — Care Management Note (Signed)
CARE MANAGEMENT NOTE 10/08/2014  Patient:  [facility]   Account Number:  [id number]  Date Initiated:  10/06/2014  Documentation initiated by:  Storey Stangeland  Subjective/Objective Assessment:   CM following for progression and d/c planning.     Action/Plan:   10/06/14 Pt given [place] application by [facility] [employer].  10/08/2014 RW ordered per PT recommendation, [facility] to eval pt for charity DME. Appointment scheduled at [place].   Anticipated DC Date:  10/08/2014   Anticipated DC Plan:  [facility]      Choice offered to / List presented to:     DME arranged  Vassie Moselle      DME agency  [place].        Status of service:  Completed, signed off Medicare Important Message given?  NO (If response is "NO", the following Medicare IM given date fields will be blank) Date Medicare IM given:   Medicare IM given by:   Date Additional Medicare IM given:   Additional Medicare IM given by:    Discharge Disposition:  HOME/SELF CARE  Per UR Regulation:    If discussed at Long Length of Stay Meetings, dates discussed:    Comments:  10/08/2014 Pt scheduled for medical followup at [facility] Friday, October 09, 2014 @ Lurena Nida RN MPH, case manager, [phone]

## 2014-10-08 NOTE — Progress Notes (Signed)
ANTICOAGULATION CONSULT NOTE - FOLLOW UP  Pharmacy Consult:  Coumadin Indication: h/o DVT, PE (5 months ago)  Allergies  Allergen Reactions  . Xarelto [Rivaroxaban] Dermatitis    Blisters skin  . Clindamycin/Lincomycin Dermatitis    Severe rash/dermatitis from November 2015 still present Jan 2016 from clinda    Patient Measurements: Height: 6\' 4"  (193 cm) Weight: 237 lb 1.6 oz (107.548 kg) IBW/kg (Calculated) : 86.8  Vital Signs: Temp: 98.4 F (36.9 C) (03/31 0500) Temp Source: Oral (03/31 0500) BP: 134/75 mmHg (03/31 0500) Pulse Rate: 85 (03/31 0500)  Labs:  Recent Labs  10/05/14 0920 10/06/14 0554 10/06/14 0845 10/07/14 0431 10/08/14 0715  HGB 14.4  --   --   --  12.3*  HCT 44.5  --   --   --  38.8*  PLT 321  --   --   --  287  LABPROT 27.9*  --  28.5* 26.3* 29.6*  INR 2.58*  --  2.66* 2.40* 2.79*  CREATININE 1.64* 1.26  --   --  1.14    Estimated Creatinine Clearance: 106.6 mL/min (by C-G formula based on Cr of 1.14).    Assessment: 31 YOM presented with progressively worsening body-wide rash.  Patient was previously on Xarelto for a recent history of DVT and PE (5 months ago) and rash was attributed to Xarelto. The patient was transitioned to warfarin - PTA dose was 15 mg daily EXCEPT for 12.5 mg on Thurs/Fri.    INR today remains therapeutic (INR 2.79 << 2.4, goal of 2-3). Hgb/Hct slight drop, plts wnl. No overt s/sx of bleeding noted.   Goal of Therapy:  INR 2-3  Plan:  1. Warfarin 12.5 mg x 1 dose at 1800 today 2. Will continue to monitor for any signs/symptoms of bleeding and will follow up with PT/INR in the a.m.   Alycia Rossetti, PharmD, BCPS Clinical Pharmacist Pager: [phone] 10/08/2014 9:01 AM

## 2014-10-08 NOTE — Progress Notes (Signed)
Offered to adm cream/and / ointment to patieint lower extremities, Patient refuses this procedure  But was receptive to upper extremities. Upper body looks much improved compared to thighs and legs. All ointments and lotions were given to patieint to use at home with teach back. Patient verbalized understanding.

## 2014-10-08 NOTE — Discharge Summary (Signed)
Physician Discharge Summary  [name] [name]:[id number] DOB: 25-Aug-1965 DOA: 10/05/2014  PCP: No PCP Per Patient  Admit date: 10/05/2014 Discharge date: 10/08/2014  Time spent: 35 minutes  Recommendations for Outpatient Follow-up:  1. Follow up with dermatology- needs to get insurance 2. Cbc 1 week 3. PT/INR in 1 week  Discharge Diagnoses:  Principal Problem:   Eczema Active Problems:   Asthma, chronic   GERD   Long-term (current) use of anticoagulants   Leg DVT (deep venous thromboembolism), chronic   Cellulitis   Tobacco abuse   Sepsis   Tachycardia   Acute on chronic renal failure   Discharge Condition: improved  Diet recommendation: regular  Filed Weights   10/05/14 2109 10/06/14 2048 10/07/14 2100  Weight: 106.595 kg (235 lb) 105.8 kg (233 lb 4 oz) 107.548 kg (237 lb 1.6 oz)    History of present illness:  [name] [name] is a 49 y.o. male, with a past medical history significant for chronic right leg DVT, substance abuse, asthma, positive PPD, and GERD who has had a five-month struggle with a body wide cellulitic rash that is reportedly a reaction to Xarelto. He has had multiple admissions for this same issue, but his condition appears to continually get worse. He lives at home alone. He presents today after being discharged yesterday. The rash has spread to his face, ears, periorbital areas, and tongue. He states he is no longer able to walk. He appears very uncomfortable. In coming through chart review, it appears that he was first admitted at [facility] in January 2016 for this issue. Prior to that he reports being evaluated and treated with antibiotics in a hospital in [place] 3 times. After the third time he was discharged home on Augmentin and presented to [facility] the next day. At that time 07/22/14 the swelling and erythematous rash was in his right leg. He also appeared to have eczema on his hands and feet. Chronic DVT was noted in his right femoral vein and popliteal vein.  He was treated with Vanco and Zosyn for 4 days and was discharged on oral doxycycline. He was advised to follow-up with a dermatologist in 3-4 days. Reportedly he saw a dermatologist in [place] who took a skin biopsy. Those results are unknown but he was told this was likely a reaction to Xarelto. He was prescribed clobetasol. He returned for his second admission at [facility] 09/28/14 due to sepsis and blistering of the skin with weeping on his lower extremities. He was again treated with vancomycin and Zosyn. He received Solu-Medrol as well. At some point he was taken off of Xeralto and placed on coumadin. He ended up being discharged from [facility] on 10/04/14 on clobetasol cream with outpatient follow up. He returns to the [facility] ER again today (10/05/14) the rash has now spread to his face, periorbital areas, and ears as well as his tongue. He states that his ears were wet-draining fluid overnight.  In the ER he is once again found to be septic with a pulse rate of 124, respirations 21, his creatinine has risen from 1.32 yesterday to 1.64 today. He appears in distress with rigors and itching.  Hospital Course:  Sepsis Source is likely body wide rash -resolved   Body wide rash (picture in chart from 3/22) s/p general surgery consult for skin biopsy- show eczema which is consistent with previous biopsy at [name]  -Supportive care including Benadryl, Pepcid, Phenergan, pain medication. 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 INR therapeutic.  Tobacco abuse.  Patient declines nicotine patch. Leaving the floor- I suspect to smoke  Asthma.  Will continue albuterol inhaler, nebulizer.  Leukocytosis -from steroids D/c'd  When I came to discuss discharge with patient, he became very upset.  Asked me to  leave the room.  Patient has been leaving to floor with family.  A febrile, rash is not worsening  Talked with patient that he needs to get insurance so he can follow up with dermatology.    Procedures:  Punch biopsy  Consultations:    Discharge Exam: Filed Vitals:   10/08/14 1100  BP: 133/92  Pulse: 84  Temp: 98.1 F (36.7 C)  Resp: 18    General: A+Ox3, NAD Cardiovascular: rrr Respiratory: clear  Discharge Instructions   Discharge Instructions    Discharge instructions    Complete by:  As directed   Short- not hot but warm showers- apply creams/ointments while skin is still wet Bowel regimen (miralax/docusate) while taking PO pain meds Cbc, bmp 1 week     Increase activity slowly    Complete by:  As directed           Current Discharge Medication List    START taking these medications   Details  HYDROcodone-acetaminophen (NORCO/VICODIN) 5-325 MG per tablet Take 1-2 tablets by mouth every 4 (four) hours as needed for moderate pain. Qty: 30 tablet, Refills: 0      CONTINUE these medications which have CHANGED   Details  clobetasol cream (TEMOVATE) 3.61 % Apply 1 application topically 2 (two) times daily. Apply to legs/arms/hands twice daily as needed Qty: 30 g, Refills: 0      CONTINUE these medications which have NOT CHANGED   Details  albuterol (PROVENTIL HFA;VENTOLIN HFA) 108 (90 BASE) MCG/ACT inhaler Inhale 2 puffs into the lungs every 6 (six) hours as needed for wheezing or shortness of breath.    albuterol (PROVENTIL) (2.5 MG/3ML) 0.083% nebulizer solution Take 3 mLs (2.5 mg total) by nebulization every 6 (six) hours as needed for wheezing. Qty: 75 mL, Refills: 11    doxepin (SINEQUAN) 25 MG capsule Take 25 mg by mouth at bedtime as needed (sleep).    feeding supplement, ENSURE, (ENSURE) PUDG Take 1 Container by mouth 3 (three) times daily between meals. Qty: 30 Can, Refills: 2    hydrocerin (EUCERIN) CREA Apply 1 application topically 2 (two)  times daily. Qty: 228 g, Refills: 0    hydrOXYzine (ATARAX/VISTARIL) 25 MG tablet Take 1 tablet (25 mg total) by mouth 3 (three) times daily as needed for itching. Qty: 30 tablet, Refills: 0    TEMAZEPAM PO Take 2 capsules by mouth at bedtime.    warfarin (COUMADIN) 5 MG tablet Take 12.5-15 mg by mouth daily. Take 15mg  on Monday, Tuesday, Wednesday, Saturday and Sunday.  12.5mg  on Thursday and Friday.      STOP taking these medications     furosemide (LASIX) 20 MG tablet        Allergies  Allergen Reactions  . Xarelto [Rivaroxaban] Dermatitis    Blisters skin  . Clindamycin/Lincomycin Dermatitis    Severe rash/dermatitis from November 2015 still present Jan 2016 from clinda   Follow-up Information    Please follow up.   Why:  care manager to arrange PCP appointment with health [facility] wellness center  The results of significant diagnostics from this hospitalization (including imaging, microbiology, ancillary and laboratory) are listed below for reference.    Significant Diagnostic Studies: Dg Chest Port 1 View  09/28/2014   CLINICAL DATA:  Sepsis, dyspnea  EXAM: PORTABLE CHEST - 1 VIEW  COMPARISON:  09/29/2013  FINDINGS: A single AP portable view of the chest demonstrates no focal airspace consolidation or alveolar edema. The lungs are grossly clear. Left nipple shadow noted, similar to the 12/20/2012 exam. There is no large effusion or pneumothorax. Cardiac and mediastinal contours appear unremarkable.  IMPRESSION: No active disease.   Electronically Signed   By: Andreas Newport M.D.   On: 09/28/2014 04:14    Microbiology: Recent Results (from the past 240 hour(s))  Culture, blood (routine x 2)     Status: None (Preliminary result)   Collection Time: 10/05/14  7:00 PM  Result Value Ref Range Status   Specimen Description BLOOD LEFT ANTECUBITAL  Final   Special Requests BOTTLES DRAWN AEROBIC AND ANAEROBIC 5CC  Final   Culture   Final           BLOOD CULTURE RECEIVED  NO GROWTH TO DATE CULTURE WILL BE HELD FOR 5 DAYS BEFORE ISSUING A FINAL NEGATIVE REPORT Performed at [facility]    Report Status PENDING  Incomplete  Culture, blood (routine x 2)     Status: None (Preliminary result)   Collection Time: 10/05/14  7:15 PM  Result Value Ref Range Status   Specimen Description BLOOD LEFT HAND  Final   Special Requests BOTTLES DRAWN AEROBIC AND ANAEROBIC 5CC  Final   Culture   Final           BLOOD CULTURE RECEIVED NO GROWTH TO DATE CULTURE WILL BE HELD FOR 5 DAYS BEFORE ISSUING A FINAL NEGATIVE REPORT Performed at [facility]    Report Status PENDING  Incomplete     Labs: Basic Metabolic Panel:  Recent Labs Lab 10/02/14 0729 10/04/14 0650 10/05/14 0920 10/06/14 0554 10/08/14 0715  NA 140 141 144 141 141  K 4.0 4.2 4.1 4.4 4.0  CL 101 102 105 107 108  CO2 29 30 27 25 26   GLUCOSE 116* 105* 144* 119* 135*  BUN 10 14 10 14 15   CREATININE 1.20 1.32 1.64* 1.26 1.14  CALCIUM 9.0 8.6 8.7 9.0 9.0   Liver Function Tests:  Recent Labs Lab 10/04/14 0650 10/05/14 0920 10/06/14 0554  AST 38* 24 23  ALT 26 26 22   ALKPHOS 68 59 74  BILITOT 0.9 0.5 0.4  PROT 5.7* 5.6* 5.2*  ALBUMIN 2.8* 2.7* 2.7*   No results for input(s): LIPASE, AMYLASE in the last 168 hours. No results for input(s): AMMONIA in the last 168 hours. CBC:  Recent Labs Lab 10/05/14 0920 10/08/14 0715  WBC 7.6 17.7*  NEUTROABS 5.3  --   HGB 14.4 12.3*  HCT 44.5 38.8*  MCV 97.2 97.7  PLT 321 287   Cardiac Enzymes: No results for input(s): CKTOTAL, CKMB, CKMBINDEX, TROPONINI in the last 168 hours. BNP: BNP (last 3 results) No results for input(s): BNP in the last 8760 hours.  ProBNP (last 3 results) No results for input(s): PROBNP in the last 8760 hours.  CBG: No results for input(s): GLUCAP in the last 168 hours.     SignedEulogio Bear  [facility] 10/08/2014, 1:30 PM

## 2014-10-08 NOTE — Progress Notes (Signed)
Family member in to visit,pt requesting to go outside assisted by family member.Instructed patient to shower when he returns,to help remove shedding skin.He is not  Happy with the suggestion of nursing  Applying cream/ointment to extremities, that he does a good job.Will attempt to re educate and on continued applications, purpose of cream and ointments.

## 2014-10-08 NOTE — Clinical Social Work Note (Cosign Needed)
Per MD, patient is ready for discharge. CSW-Intern provided patient with bus pass for transportation home.     Gwenevere Abbot CSW-Intern [phone]

## 2014-10-09 ENCOUNTER — Inpatient Hospital Stay: Payer: Self-pay | Admitting: Family Medicine

## 2014-10-09 LAB — RPR: RPR Ser Ql: NONREACTIVE

## 2014-10-10 DIAGNOSIS — Z79899 Other long term (current) drug therapy: Secondary | ICD-10-CM | POA: Insufficient documentation

## 2014-10-10 DIAGNOSIS — Z8719 Personal history of other diseases of the digestive system: Secondary | ICD-10-CM | POA: Insufficient documentation

## 2014-10-10 DIAGNOSIS — J45909 Unspecified asthma, uncomplicated: Secondary | ICD-10-CM | POA: Insufficient documentation

## 2014-10-10 DIAGNOSIS — L259 Unspecified contact dermatitis, unspecified cause: Secondary | ICD-10-CM | POA: Insufficient documentation

## 2014-10-10 DIAGNOSIS — Z7901 Long term (current) use of anticoagulants: Secondary | ICD-10-CM | POA: Insufficient documentation

## 2014-10-10 DIAGNOSIS — R45851 Suicidal ideations: Secondary | ICD-10-CM | POA: Insufficient documentation

## 2014-10-10 DIAGNOSIS — Z86718 Personal history of other venous thrombosis and embolism: Secondary | ICD-10-CM | POA: Insufficient documentation

## 2014-10-10 DIAGNOSIS — Z8611 Personal history of tuberculosis: Secondary | ICD-10-CM | POA: Insufficient documentation

## 2014-10-10 DIAGNOSIS — Z720 Tobacco use: Secondary | ICD-10-CM | POA: Insufficient documentation

## 2014-10-10 DIAGNOSIS — R011 Cardiac murmur, unspecified: Secondary | ICD-10-CM | POA: Insufficient documentation

## 2014-10-11 ENCOUNTER — Emergency Department (HOSPITAL_COMMUNITY)
Admission: EM | Admit: 2014-10-11 | Discharge: 2014-10-11 | Disposition: A | Discharge status: Other Acute Inpatient Hospital | Payer: Self-pay | Attending: Emergency Medicine | Admitting: Emergency Medicine

## 2014-10-11 ENCOUNTER — Inpatient Hospital Stay (HOSPITAL_COMMUNITY)
Admission: AD | Admit: 2014-10-11 | Discharge: 2014-10-19 | DRG: 885 | Disposition: A | Discharge status: Home / Self Care | Payer: Federal, State, Local not specified - Other | Source: Intra-hospital | Attending: Psychiatry | Admitting: Psychiatry

## 2014-10-11 ENCOUNTER — Encounter (HOSPITAL_COMMUNITY): Payer: Self-pay | Admitting: *Deleted

## 2014-10-11 ENCOUNTER — Encounter (HOSPITAL_COMMUNITY): Payer: Self-pay

## 2014-10-11 DIAGNOSIS — G894 Chronic pain syndrome: Secondary | ICD-10-CM | POA: Diagnosis present

## 2014-10-11 DIAGNOSIS — J45909 Unspecified asthma, uncomplicated: Secondary | ICD-10-CM | POA: Diagnosis present

## 2014-10-11 DIAGNOSIS — F332 Major depressive disorder, recurrent severe without psychotic features: Principal | ICD-10-CM | POA: Diagnosis present

## 2014-10-11 DIAGNOSIS — R45851 Suicidal ideations: Secondary | ICD-10-CM | POA: Diagnosis present

## 2014-10-11 DIAGNOSIS — Z825 Family history of asthma and other chronic lower respiratory diseases: Secondary | ICD-10-CM

## 2014-10-11 DIAGNOSIS — Z590 Homelessness: Secondary | ICD-10-CM

## 2014-10-11 DIAGNOSIS — N189 Chronic kidney disease, unspecified: Secondary | ICD-10-CM | POA: Diagnosis present

## 2014-10-11 DIAGNOSIS — Z7901 Long term (current) use of anticoagulants: Secondary | ICD-10-CM | POA: Diagnosis not present

## 2014-10-11 DIAGNOSIS — K219 Gastro-esophageal reflux disease without esophagitis: Secondary | ICD-10-CM | POA: Diagnosis present

## 2014-10-11 DIAGNOSIS — M1990 Unspecified osteoarthritis, unspecified site: Secondary | ICD-10-CM | POA: Diagnosis present

## 2014-10-11 DIAGNOSIS — Z808 Family history of malignant neoplasm of other organs or systems: Secondary | ICD-10-CM

## 2014-10-11 DIAGNOSIS — F419 Anxiety disorder, unspecified: Secondary | ICD-10-CM | POA: Diagnosis present

## 2014-10-11 DIAGNOSIS — Z86711 Personal history of pulmonary embolism: Secondary | ICD-10-CM

## 2014-10-11 DIAGNOSIS — L309 Dermatitis, unspecified: Secondary | ICD-10-CM

## 2014-10-11 DIAGNOSIS — F1590 Other stimulant use, unspecified, uncomplicated: Secondary | ICD-10-CM | POA: Diagnosis present

## 2014-10-11 HISTORY — DX: Bipolar disorder, unspecified: F31.9

## 2014-10-11 HISTORY — DX: Other pulmonary embolism without acute cor pulmonale: I26.99

## 2014-10-11 HISTORY — DX: Alcohol abuse, uncomplicated: F10.10

## 2014-10-11 LAB — COMPREHENSIVE METABOLIC PANEL
ALT: 55 U/L — ABNORMAL HIGH (ref 0–53)
AST: 21 U/L (ref 0–37)
Albumin: 3.6 g/dL (ref 3.5–5.2)
Alkaline Phosphatase: 67 U/L (ref 39–117)
Anion gap: 13 (ref 5–15)
BUN: 11 mg/dL (ref 6–23)
CO2: 22 mmol/L (ref 19–32)
Calcium: 9.3 mg/dL (ref 8.4–10.5)
Chloride: 101 mmol/L (ref 96–112)
Creatinine, Ser: 1.12 mg/dL (ref 0.50–1.35)
GFR calc Af Amer: 88 mL/min — ABNORMAL LOW (ref >90)
GFR calc non Af Amer: 76 mL/min — ABNORMAL LOW (ref >90)
GLUCOSE: 97 mg/dL (ref 70–99)
POTASSIUM: 4 mmol/L (ref 3.5–5.1)
Sodium: 136 mmol/L (ref 135–145)
Total Bilirubin: 0.4 mg/dL (ref 0.3–1.2)
Total Protein: 7.3 g/dL (ref 6.0–8.3)

## 2014-10-11 LAB — CBC
HCT: 44.6 % (ref 39.0–52.0)
Hemoglobin: 14.9 g/dL (ref 13.0–17.0)
MCH: 31.3 pg (ref 26.0–34.0)
MCHC: 33.4 g/dL (ref 30.0–36.0)
MCV: 93.7 fL (ref 78.0–100.0)
Platelets: 291 10*3/uL (ref 150–400)
RBC: 4.76 MIL/uL (ref 4.22–5.81)
RDW: 13.9 % (ref 11.5–15.5)
WBC: 9.3 10*3/uL (ref 4.0–10.5)

## 2014-10-11 LAB — RAPID URINE DRUG SCREEN, HOSP PERFORMED
Amphetamines: NOT DETECTED
BENZODIAZEPINES: NOT DETECTED
Barbiturates: NOT DETECTED
Cocaine: POSITIVE — AB
Opiates: NOT DETECTED
TETRAHYDROCANNABINOL: NOT DETECTED

## 2014-10-11 LAB — SALICYLATE LEVEL: Salicylate Lvl: <4 mg/dL (ref 2.8–20.0)

## 2014-10-11 LAB — PROTIME-INR
INR: 1.38 (ref 0.00–1.49)
PROTHROMBIN TIME: 17.1 s — AB (ref 11.6–15.2)

## 2014-10-11 LAB — ACETAMINOPHEN LEVEL

## 2014-10-11 LAB — ETHANOL

## 2014-10-11 MED ORDER — WARFARIN - PHYSICIAN DOSING INPATIENT
Freq: Every day | Status: DC
  Filled 2014-10-11 (×9): qty 1

## 2014-10-11 MED ORDER — ALUM & MAG HYDROXIDE-SIMETH 200-200-20 MG/5ML PO SUSP: 30.0000 mL | ORAL | Status: DC | PRN

## 2014-10-11 MED ORDER — WARFARIN SODIUM 7.5 MG PO TABS
15.0000 mg | ORAL_TABLET | ORAL | Status: DC
  Administered 2014-10-12: 15 mg via ORAL
  Filled 2014-10-11 (×2): qty 2

## 2014-10-11 MED ORDER — IBUPROFEN 400 MG PO TABS: 600.0000 mg | ORAL_TABLET | Freq: Three times a day (TID) | ORAL | Status: DC | PRN

## 2014-10-11 MED ORDER — HYDROXYZINE HCL 25 MG PO TABS
25.0000 mg | ORAL_TABLET | Freq: Three times a day (TID) | ORAL | Status: DC | PRN
  Administered 2014-10-11 – 2014-10-19 (×14): 25 mg via ORAL
  Filled 2014-10-11: qty 1
  Filled 2014-10-11: qty 30
  Filled 2014-10-11 (×12): qty 1

## 2014-10-11 MED ORDER — CLOBETASOL PROPIONATE 0.05 % EX CREA
1.0000 "application " | TOPICAL_CREAM | Freq: Two times a day (BID) | CUTANEOUS | Status: DC | PRN
  Administered 2014-10-13 – 2014-10-18 (×5): 1 via TOPICAL
  Filled 2014-10-11 (×2): qty 15

## 2014-10-11 MED ORDER — WARFARIN - PHYSICIAN DOSING INPATIENT: Freq: Every day | Status: DC

## 2014-10-11 MED ORDER — NICOTINE 14 MG/24HR TD PT24
14.0000 mg | MEDICATED_PATCH | Freq: Every day | TRANSDERMAL | Status: DC
  Administered 2014-10-11 – 2014-10-19 (×9): 14 mg via TRANSDERMAL
  Filled 2014-10-11 (×14): qty 1

## 2014-10-11 MED ORDER — WARFARIN SODIUM 5 MG PO TABS: 12.5000 mg | ORAL_TABLET | Freq: Every day | ORAL | Status: DC

## 2014-10-11 MED ORDER — MAGNESIUM HYDROXIDE 400 MG/5ML PO SUSP: 30.0000 mL | Freq: Every day | ORAL | Status: DC | PRN

## 2014-10-11 MED ORDER — ALBUTEROL SULFATE HFA 108 (90 BASE) MCG/ACT IN AERS: 2.0000 | INHALATION_SPRAY | Freq: Four times a day (QID) | RESPIRATORY_TRACT | Status: DC | PRN

## 2014-10-11 MED ORDER — HYDROCODONE-ACETAMINOPHEN 5-325 MG PO TABS
1.0000 | ORAL_TABLET | Freq: Four times a day (QID) | ORAL | Status: DC | PRN
  Administered 2014-10-11: 1 via ORAL
  Filled 2014-10-11: qty 1

## 2014-10-11 MED ORDER — CLOBETASOL PROPIONATE 0.05 % EX CREA
1.0000 "application " | TOPICAL_CREAM | Freq: Two times a day (BID) | CUTANEOUS | Status: DC
  Filled 2014-10-11: qty 15

## 2014-10-11 MED ORDER — ENSURE PUDDING PO PUDG
1.0000 | Freq: Three times a day (TID) | ORAL | Status: DC
  Administered 2014-10-11 (×2): 1 via ORAL
  Filled 2014-10-11 (×3): qty 1

## 2014-10-11 MED ORDER — DOXEPIN HCL 25 MG PO CAPS
25.0000 mg | ORAL_CAPSULE | Freq: Every evening | ORAL | Status: DC | PRN
  Filled 2014-10-11: qty 1

## 2014-10-11 MED ORDER — HYDROXYZINE HCL 25 MG PO TABS
25.0000 mg | ORAL_TABLET | Freq: Three times a day (TID) | ORAL | Status: DC | PRN
  Administered 2014-10-11: 25 mg via ORAL
  Filled 2014-10-11: qty 1

## 2014-10-11 MED ORDER — CLOBETASOL PROPIONATE 0.05 % EX OINT
TOPICAL_OINTMENT | Freq: Two times a day (BID) | CUTANEOUS | Status: DC
  Administered 2014-10-11: 12:00:00 via TOPICAL
  Filled 2014-10-11 (×2): qty 15

## 2014-10-11 MED ORDER — LORAZEPAM 1 MG PO TABS: 1.0000 mg | ORAL_TABLET | Freq: Three times a day (TID) | ORAL | Status: DC | PRN

## 2014-10-11 MED ORDER — ALBUTEROL SULFATE (2.5 MG/3ML) 0.083% IN NEBU: 2.5000 mg | INHALATION_SOLUTION | Freq: Four times a day (QID) | RESPIRATORY_TRACT | Status: DC | PRN

## 2014-10-11 MED ORDER — HYDROCODONE-ACETAMINOPHEN 5-325 MG PO TABS
1.0000 | ORAL_TABLET | ORAL | Status: DC | PRN
  Administered 2014-10-11 (×2): 1 via ORAL
  Filled 2014-10-11 (×2): qty 1

## 2014-10-11 MED ORDER — ONDANSETRON HCL 4 MG PO TABS: 4.0000 mg | ORAL_TABLET | Freq: Three times a day (TID) | ORAL | Status: DC | PRN

## 2014-10-11 MED ORDER — WARFARIN SODIUM 7.5 MG PO TABS
15.0000 mg | ORAL_TABLET | ORAL | Status: DC
  Filled 2014-10-11: qty 2

## 2014-10-11 MED ORDER — DOXEPIN HCL 25 MG PO CAPS
25.0000 mg | ORAL_CAPSULE | Freq: Every evening | ORAL | Status: DC | PRN
Start: 2014-10-11 — End: 2014-10-19
  Administered 2014-10-11 – 2014-10-12 (×2): 25 mg via ORAL
  Filled 2014-10-11: qty 1
  Filled 2014-10-11: qty 14
  Filled 2014-10-11: qty 1

## 2014-10-11 MED ORDER — ACETAMINOPHEN 325 MG PO TABS
650.0000 mg | ORAL_TABLET | Freq: Four times a day (QID) | ORAL | Status: DC | PRN
  Administered 2014-10-12: 650 mg via ORAL
  Filled 2014-10-11 (×2): qty 2

## 2014-10-11 MED ORDER — HYDROCERIN EX CREA
1.0000 "application " | TOPICAL_CREAM | Freq: Two times a day (BID) | CUTANEOUS | Status: DC
  Administered 2014-10-11: 1 via TOPICAL
  Filled 2014-10-11 (×2): qty 113

## 2014-10-11 MED ORDER — WARFARIN SODIUM 2.5 MG PO TABS: 12.5000 mg | ORAL_TABLET | ORAL | Status: DC

## 2014-10-11 NOTE — ED Notes (Signed)
Copy of consent forms faxed to [facility].

## 2014-10-11 NOTE — ED Notes (Signed)
The pt is sent here from [facility] for a medical condition. Rash over his body

## 2014-10-11 NOTE — BH Assessment (Signed)
0400:  Consulted with Dr. Radene Gunning about Patient.  Dr. Radene Gunning reports the Patient was sent to [facility] from [facility].  Pateitn had SI with intent this morning and reports current SI, but will not disclose plan.  He reports the Patient has a history of depression and chronic rash which caused him to lose his job and become depressed.  0402:  Tele-assessment scheduled.  4627:  Tele-assessment completed.  0430:  Consulted with Othella Boyer, Extender.  Per Felicie Morn; Patient meets inpatient criteria, bed at [facility] if one is available.  0350:  Consulted with [place].  0515:  Provided Dr. Venora Maples with Patient disposition.
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
## 2014-10-11 NOTE — Progress Notes (Signed)
Patient did attend the evening speaker AA meeting.  

## 2014-10-11 NOTE — ED Notes (Signed)
wanded by security   Placed in cranberry scrubs  Sitter at the bedside

## 2014-10-11 NOTE — ED Notes (Signed)
Chg rn notified that the pt needs a Art therapist has been called for a sitter.

## 2014-10-11 NOTE — ED Notes (Signed)
Being assessed at this time via telepsych.

## 2014-10-11 NOTE — ED Notes (Signed)
The pt is ivcd c/o pain in his lt leg

## 2014-10-11 NOTE — Tx Team (Signed)
Initial Interdisciplinary Treatment Plan   PATIENT STRESSORS: Financial difficulties Health problems Occupational concerns Substance abuse   PATIENT STRENGTHS: Ability for insight Average or above average intelligence Communication skills Motivation for treatment/growth Supportive family/friends   PROBLEM LIST: Problem List/Patient Goals Date to be addressed Date deferred Reason deferred Estimated date of resolution  "Housing, I'm gonna need help with that" 10/11/2014  10/11/2014    "I would take 3 pills, jump into traffic or hang myself" 10/11/2014 10/11/2014    Depression 10/11/2014 10/11/2014    Substance Abuse - Cocaine 10/11/2014 10/11/2014                                   DISCHARGE CRITERIA:  Adequate post-discharge living arrangements Improved stabilization in mood, thinking, and/or behavior Medical problems require only outpatient monitoring Safe-care adequate arrangements made  PRELIMINARY DISCHARGE PLAN: Attend aftercare/continuing care group Placement in alternative living arrangements  PATIENT/FAMIILY INVOLVEMENT: This treatment plan has been presented to and reviewed with the patient, [name] [name] . The patient and family have been given the opportunity to ask questions and make suggestions.  [name] 10/11/2014, 6:58 PM
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
## 2014-10-11 NOTE — Progress Notes (Addendum)
Patient ID: [name] [name], male   DOB: 1965-09-12, 49 y.o.   MRN: [id number]  49 year old male presents to [facility] from [facility] ED. Pt reports increased depression and SI with thoughts to "jump into traffic", "just hang myself" or "take 3 oxycodone pills". Pt denies HI and AVH. Pt reports these thoughts have been worsening since he lost his job "4 months ago." Pt reports that in October he had surgery for a DVT in his leg and "that's when my allergy to [place] started. My hands started sloughing off and cracking, my legs will swell and then weep." Pt currently on Coumadin due to history of DVT and pulmonary embolism. Pt has a history of asthma, Bipolar, GERD and arthritis. The notes report that he may have had a reaction to Clindamycin around the same time.   Pt poor historian.Pt tearful when talking about skin condition. Pt skin on hands is flaky and dry (see Doc Flowsheet) Pt reports monthly alcohol use and weekly cocaine use. Pt reports that his biggest concern is "housing, maybe in [place], I need disability and my Medicaid just got denied." Pt endorses mom as his main support, she lives in [place] too. Pt on high fall risk due to unsteady gait due to chronic "foot pain". Pt consult acknowledge in admission completion.   Pt oriented to unit. Skin/belongings search completed, consents signed and pt given the opportunity to express concerns and ask questions. Fall risk signage in use. Pt given a meal tray.

## 2014-10-11 NOTE — ED Notes (Signed)
Continue to await pharmacy to send his eucerin lotion

## 2014-10-12 LAB — CULTURE, BLOOD (ROUTINE X 2)
Culture: NO GROWTH
Culture: NO GROWTH

## 2014-10-12 LAB — PROTIME-INR
INR: 1.09 (ref 0.00–1.49)
Prothrombin Time: 14.3 seconds (ref 11.6–15.2)

## 2014-10-12 MED ORDER — WARFARIN - PHARMACIST DOSING INPATIENT
Freq: Every day | Status: DC
  Administered 2014-10-16 – 2014-10-19 (×4)
  Filled 2014-10-12 (×16): qty 1

## 2014-10-12 MED ORDER — HYDROCODONE-ACETAMINOPHEN 5-325 MG PO TABS
1.0000 | ORAL_TABLET | ORAL | Status: DC | PRN
  Administered 2014-10-12 – 2014-10-13 (×3): 2 via ORAL
  Administered 2014-10-13: 1 via ORAL
  Administered 2014-10-13: 2 via ORAL
  Administered 2014-10-14 – 2014-10-15 (×4): 1 via ORAL
  Administered 2014-10-15: 2 via ORAL
  Administered 2014-10-16 – 2014-10-17 (×2): 1 via ORAL
  Administered 2014-10-18: 2 via ORAL
  Administered 2014-10-18: 1 via ORAL
  Administered 2014-10-19 (×2): 2 via ORAL
  Filled 2014-10-12: qty 1
  Filled 2014-10-12: qty 2
  Filled 2014-10-12 (×2): qty 1
  Filled 2014-10-12 (×3): qty 2
  Filled 2014-10-12 (×2): qty 1
  Filled 2014-10-12: qty 2
  Filled 2014-10-12: qty 1
  Filled 2014-10-12: qty 2
  Filled 2014-10-12 (×2): qty 1
  Filled 2014-10-12 (×2): qty 2

## 2014-10-12 NOTE — Progress Notes (Signed)
Recreation Therapy Notes  Date: 04.04.2016 Time: 9:30am Location: [address] Group Room   Group Topic: Stress Management  Goal Area(s) Addresses:  Patient will actively participate in stress management techniques presented during session.   Behavioral Response: Did not attend.   Laureen Ochs Gracynn Rajewski, LRT/CTRS  Tripton Ned L 10/12/2014 3:05 PM

## 2014-10-12 NOTE — Plan of Care (Signed)
Problem: Alteration in mood Goal: LTG-Patient reports reduction in suicidal thoughts (Patient reports reduction in suicidal thoughts and is able to verbalize a safety plan for whenever patient is feeling suicidal)  Outcome: Completed/Met Date Met:  10/12/14 Nurse discussed suicidal thoughts, coping skills with patient.

## 2014-10-12 NOTE — Progress Notes (Addendum)
D:  Patient's self inventory sheet, patient has poor sleep, sleep medication is helpful.  Good appetite, low energy level, good concentration.  Rated depression 9, hopeless 10, anxiety 8.   Denied withdrawals.  SI, contracts for safety.  Denied withdrawals.  Denied physical problems,  Worst pain #8, feet, legs, back.  Taking pain medication  Not sure of goal today.  No discharge plan.   A:  Medications administered per MD orders.   Emotional support and encouragement given patient. R:  Safety maintained with 15 minute checks.   1825  Patient has been refused several times to take his coumadin scheduled at 1800.  Patient sitting in wheelchair in dayroom watching tv with peers.  NP informed and NP has talked to patient.  Safety maintained with 15 minute checks.

## 2014-10-12 NOTE — BHH Group Notes (Signed)
[facility] LCSW Group Therapy  10/12/2014 2:04 PM  Type of Therapy:  Group Therapy  Participation Level:  Minimal  Participation Quality:  Inattentive  Affect:  Depressed and Flat  Cognitive:  Oriented  Insight:  Limited  Engagement in Therapy:  Limited  Modes of Intervention:  Confrontation, Discussion, Education, Exploration, Problem-solving, Rapport Building, Socialization and Support  Summary of Progress/Problems: Today's Topic: Overcoming Obstacles. Pt identified obstacles faced currently and processed barriers involved in overcoming these obstacles. Pt identified steps necessary for overcoming these obstacles and explored motivation (internal and external) for facing these difficulties head on. Pt further identified one area of concern in their lives and chose a skill of focus pulled from their "toolbox." [facility] came to group late and stated that he was in pain. Pt had difficulty answering questions due to obvious signs of pain. "I can't focus on anything. I'm in too much pain." [name] [name] shared that he decided to come to the ED due to increased SI and recent homelessness. He did not discuss any particular obstacle and did not want to participate in group discussion. At this time, [name] [name] is not making progress in the group setting and reports that his unmanaged pain.   Smart, [name] LCSWA  10/12/2014, 2:04 PM

## 2014-10-12 NOTE — BHH Group Notes (Signed)
[facility] LCSW Aftercare Discharge Planning Group Note   10/12/2014 9:19 AM  Participation Quality:  Invited-DID NOT ATTEND-pt in room sleeping.   Smart, [employer]

## 2014-10-12 NOTE — Progress Notes (Signed)
Patient in day room during this assessment. Mood and affect sad and depressed. Patient reported that he has been complaining of pain all day and nothing was done about his pain. Day RN reported that there was a new order for stronger pain medication but patient was angry and won't come up for the medication. He rated his pain on his legs at 8 on a scale of 1-10 with 10 the worst. Writer offered patient 2 tabs of Vicodin. Patient received medication without difficulty. Q 15 minute check continues as odered to maintain safety.
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
## 2014-10-12 NOTE — Progress Notes (Signed)
D: Patient on wheel chair due to chronic "foot pain" causing him unsteady gait. Blunt affect and sarcastic at times. Seen at all times picking his peeling hands with his teeth and spitting it out. This writer verbally redirected patient from doing so and offered Vaseline ointment for soothing effect. Patient declined the Vaseline ointment stated he has an ointment prescribed for him which was given to him. When asked about pain, patient stated "so so". Endorses depression, SI without plan. Denies AH/VH. A: Support and encouragement offered to patient. Encouraged to participate in his treatment plan and verbalize concerns to staff. Due medications given as ordered. Every 15 minutes check for safety maintained. Will continue to monitor patient safety and stability. R: Patient is safe.

## 2014-10-12 NOTE — Progress Notes (Signed)
ANTICOAGULATION CONSULT NOTE - Follow Up Consult  Pharmacy Consult for Coumadin Indication: h/o dvt/pe  Allergies  Allergen Reactions  . Xarelto [Rivaroxaban] Dermatitis    Blisters skin  . Clindamycin/Lincomycin Dermatitis    Severe rash/dermatitis from November 2015 still present Jan 2016 from [facility]    Patient Measurements: Height: 6\' 4"  (193 cm) Weight: 218 lb (98.884 kg) IBW/kg (Calculated) : 86.8   Vital Signs:    Labs:  Recent Labs  10/11/14 0042 10/11/14 0850 10/12/14 0622  HGB 14.9  --   --   HCT 44.6  --   --   PLT 291  --   --   LABPROT  --  17.1* 14.3  INR  --  1.38 1.09  CREATININE 1.12  --   --     Estimated Creatinine Clearance: 99 mL/min (by C-G formula based on Cr of 1.12).   Medications:  Prescriptions prior to admission  Medication Sig Dispense Refill Last Dose  . albuterol (PROVENTIL HFA;VENTOLIN HFA) 108 (90 BASE) MCG/ACT inhaler Inhale 2 puffs into the lungs every 6 (six) hours as needed for wheezing or shortness of breath.   Past Month at Unknown time  . albuterol (PROVENTIL) (2.5 MG/3ML) 0.083% nebulizer solution Take 3 mLs (2.5 mg total) by nebulization every 6 (six) hours as needed for wheezing. 75 mL 11 over 30 days  . clobetasol cream (TEMOVATE) 9.41 % Apply 1 application topically 2 (two) times daily. Apply to legs/arms/hands twice daily as needed 30 g 0   . doxepin (SINEQUAN) 25 MG capsule Take 25 mg by mouth at bedtime as needed (sleep).   Past Week at Unknown time  . feeding supplement, ENSURE, (ENSURE) PUDG Take 1 Container by mouth 3 (three) times daily between meals. 30 Can 2 10/04/2014 at Unknown time  . hydrocerin (EUCERIN) CREA Apply 1 application topically 2 (two) times daily. 228 g 0 Past Week at Unknown time  . HYDROcodone-acetaminophen (NORCO/VICODIN) 5-325 MG per tablet Take 1-2 tablets by mouth every 4 (four) hours as needed for moderate pain. 30 tablet 0   . hydrOXYzine (ATARAX/VISTARIL) 25 MG tablet Take 1 tablet (25 mg  total) by mouth 3 (three) times daily as needed for itching. 30 tablet 0 10/04/2014 at Unknown time  . TEMAZEPAM PO Take 2 capsules by mouth at bedtime.   Past Month at Unknown time  . warfarin (COUMADIN) 5 MG tablet Take 12.5-15 mg by mouth daily. Take 15mg  on Monday, Tuesday, Wednesday, Saturday and Sunday.  12.5mg  on Thursday and Friday.   10/04/2014 at Unknown time   Scheduled:  . nicotine  14 mg Transdermal Daily  . [START ON 10/15/2014] warfarin  12.5 mg Oral Once per day on Thu Fri  . warfarin  15 mg Oral Once per day on Sun Mon Tue Wed Sat  . Warfarin - Pharmacist Dosing Inpatient   Does not apply q1800    Assessment: INR well below goal.  Will continue home dose for now and adjust as needed  Goal of Therapy:  INR 2-3   Plan:  Continue home dose as ordered coumadin 15 mg m-t-w-s and 12.5 mg on Thu-f. Will do PT/INR in am   Lenox Ponds 10/12/2014,7:44 AM
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
## 2014-10-12 NOTE — Progress Notes (Signed)
Patient did attend the evening speaker AA meeting.  

## 2014-10-12 NOTE — Tx Team (Signed)
Interdisciplinary Treatment Plan Update (Adult)   Date: 10/12/2014   Time Reviewed: 9:48 AM  Progress in Treatment:  Attending groups: No.  Participating in groups:  No.  Taking medication as prescribed: Yes  Tolerating medication: Yes  Family/Significant othe contact made: Not yet. SPE required for this pt.   Patient understands diagnosis: Yes, AEB seeking treatment for cocaine/marijuana/alcohol abuse, SI with plan, depression/mood instability, and for medication stabilization.  Discussing patient identified problems/goals with staff: Yes  Medical problems stabilized or resolved: Yes  Denies suicidal/homicidal ideation: Passive SI/able to contract for safety on unit.  Patient has not harmed self or Others: Yes  New problem(s) identified:  Discharge Plan or Barriers: Pt did not attend morning d/c planning group. CSW assessing for appropriate referrals.  Additional comments: [name] [name] is an 49 y.o. male who reports being transported to [facility] by [facility] after he presented there with SI with a plan. The Patient reports having a rash that has lasted for the past 5 months. He reports the rash has caused him to lose his job and subsequently he lost his housing. The Patient presented orientated x4, mood "depressed", affect depressed and irritable, denied AVH and HI, and reports current SI with a plan to overdose on a "stash of pills." The Patient reports having a previous mental health diagnosis of Bipolar and feeling depressed, tearful, no appetite, and angry. He reports sleeping 4 hours per night. The Patient reports being hospitalized at [facility] a month ago for SI with a plan. The Patient reports consuming alcohol yesterday, taking a puff off a Cannabis joint yesterday, and using 4 oz of cocaine yesterday. The Patient reports not taking prescribed Zoloft for the past month due to no money.  Reason for Continuation of Hospitalization: Medication management Depression Passive  SI Estimated length of stay: 1-3 days  For review of initial/current patient goals, please see plan of care.  Attendees:  Patient:    Family:    Physician: Dr. Parke Poisson MD 10/12/2014 9:50 AM   Nursing: Reuel Derby RN; Joellen Jersey 10/12/2014 9:50 AM   Clinical Social Worker [place], [place]  10/12/2014 9:50 AM   Other: Caryn Bee. LCSWLeana Gamer 10/12/2014 9:50 AM   Other: Gerline Legacy Nurse CM 10/12/2014 9:50 AM   Other: Hilda Lias, Community Care Coordinator  10/12/2014 9:50 AM   Other:    Scribe for Treatment Team:  Maxie Better LCSWA 10/12/2014 9:50 AM

## 2014-10-13 DIAGNOSIS — R45851 Suicidal ideations: Secondary | ICD-10-CM

## 2014-10-13 DIAGNOSIS — G894 Chronic pain syndrome: Secondary | ICD-10-CM | POA: Diagnosis present

## 2014-10-13 LAB — PROTIME-INR
INR: 0.99 (ref 0.00–1.49)
PROTHROMBIN TIME: 13.2 s (ref 11.6–15.2)

## 2014-10-13 MED ORDER — SERTRALINE HCL 25 MG PO TABS
25.0000 mg | ORAL_TABLET | Freq: Every day | ORAL | Status: DC
  Administered 2014-10-13 – 2014-10-14 (×2): 25 mg via ORAL
  Filled 2014-10-13 (×3): qty 1

## 2014-10-13 MED ORDER — WARFARIN SODIUM 10 MG PO TABS
20.0000 mg | ORAL_TABLET | Freq: Once | ORAL | Status: AC
  Administered 2014-10-13: 20 mg via ORAL
  Filled 2014-10-13: qty 2

## 2014-10-13 MED ORDER — TRAZODONE HCL 100 MG PO TABS
100.0000 mg | ORAL_TABLET | Freq: Every evening | ORAL | Status: DC | PRN
  Administered 2014-10-13 – 2014-10-18 (×6): 100 mg via ORAL
  Filled 2014-10-13: qty 1
  Filled 2014-10-13: qty 14
  Filled 2014-10-13 (×4): qty 1

## 2014-10-13 NOTE — Plan of Care (Signed)
Problem: Diagnosis: Increased Risk For Suicide Attempt Goal: STG-Patient Will Report Suicidal Feelings to Staff Outcome: Completed/Met Date Met:  10/13/14 Nurse discussed suicidal thoughts/ coping skills with patient.

## 2014-10-13 NOTE — [facility] Group Notes (Signed)
The focus of this group is to educate the patient on the purpose and policies of crisis stabilization and provide a format to answer questions about their admission.  The group details unit policies and expectations of patients while admitted.  Patient did not attend 0900 nurse education orientation group this morning.  Patient stayed in his room. 
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
## 2014-10-13 NOTE — Progress Notes (Signed)
Recreation Therapy Notes  Animal-Assisted Activity (AAA) Program Checklist/Progress Notes Patient Eligibility Criteria Checklist & Daily Group note for Rec Tx Intervention  Date: 04.05.2016 Time: 2:45pm Location: [address]   AAA/T Program Assumption of Risk Form signed by Patient/ or Parent Legal Guardian yes  Patient is free of allergies or sever asthma yes  Patient reports no fear of animals yes  Patient reports no history of cruelty to animals yes  Patient understands his/her participation is voluntary yes  Behavioral Response: Did not attend.    Laureen Ochs Jerusalen Mateja, LRT/CTRS  Lane Hacker 10/13/2014 5:13 PM

## 2014-10-13 NOTE — Progress Notes (Signed)
ANTICOAGULATION CONSULT NOTE - Follow Up Consult  Pharmacy Consult for coumadin Indication: H/O PE/DVT  Allergies  Allergen Reactions  . Xarelto [Rivaroxaban] Dermatitis    Blisters skin  . Clindamycin/Lincomycin Dermatitis    Severe rash/dermatitis from November 2015 still present Jan 2016 from clinda    Patient Measurements: Height: 6\' 4"  (193 cm) Weight: 218 lb (98.884 kg) IBW/kg (Calculated) : 86.8   Vital Signs: Temp: 98.4 F (36.9 C) (04/05 0605) BP: 133/99 mmHg (04/05 0606) Pulse Rate: 93 (04/05 0606)  Labs:  Recent Labs  10/11/14 0042 10/11/14 0850 10/12/14 0622 10/13/14 0630  HGB 14.9  --   --   --   HCT 44.6  --   --   --   PLT 291  --   --   --   LABPROT  --  17.1* 14.3 13.2  INR  --  1.38 1.09 0.99  CREATININE 1.12  --   --   --     Estimated Creatinine Clearance: 99 mL/min (by C-G formula based on Cr of 1.12).   Medications:  Prescriptions prior to admission  Medication Sig Dispense Refill Last Dose  . albuterol (PROVENTIL HFA;VENTOLIN HFA) 108 (90 BASE) MCG/ACT inhaler Inhale 2 puffs into the lungs every 6 (six) hours as needed for wheezing or shortness of breath.   Past Month at Unknown time  . albuterol (PROVENTIL) (2.5 MG/3ML) 0.083% nebulizer solution Take 3 mLs (2.5 mg total) by nebulization every 6 (six) hours as needed for wheezing. 75 mL 11 over 30 days  . clobetasol cream (TEMOVATE) 0.08 % Apply 1 application topically 2 (two) times daily. Apply to legs/arms/hands twice daily as needed 30 g 0   . doxepin (SINEQUAN) 25 MG capsule Take 25 mg by mouth at bedtime as needed (sleep).   Past Week at Unknown time  . feeding supplement, ENSURE, (ENSURE) PUDG Take 1 Container by mouth 3 (three) times daily between meals. 30 Can 2 10/04/2014 at Unknown time  . hydrocerin (EUCERIN) CREA Apply 1 application topically 2 (two) times daily. 228 g 0 Past Week at Unknown time  . HYDROcodone-acetaminophen (NORCO/VICODIN) 5-325 MG per tablet Take 1-2 tablets by  mouth every 4 (four) hours as needed for moderate pain. 30 tablet 0   . hydrOXYzine (ATARAX/VISTARIL) 25 MG tablet Take 1 tablet (25 mg total) by mouth 3 (three) times daily as needed for itching. 30 tablet 0 10/04/2014 at Unknown time  . TEMAZEPAM PO Take 2 capsules by mouth at bedtime.   Past Month at Unknown time  . warfarin (COUMADIN) 5 MG tablet Take 12.5-15 mg by mouth daily. Take 15mg  on Monday, Tuesday, Wednesday, Saturday and Sunday.  12.5mg  on Thursday and Friday.   10/04/2014 at Unknown time    Assessment: INR dropped.  Patient initially refused dose then took before bed.  Will start lovenox in am if no trend of INR increasing by am lab.  Patient missed dose 10/11/14 Goal of Therapy:  INR 2-3    Plan:  Coumadin 20 mg x 1 today PT/INR ina am   Lenox Ponds 10/13/2014,9:19 AM

## 2014-10-13 NOTE — BHH Group Notes (Signed)
[place] LCSW Group Therapy  10/13/2014 1:27 PM  Type of Therapy:  Group Therapy  Participation Level:  Minimal  Participation Quality:  Drowsy  Affect:  Lethargic  Cognitive:  Lacking  Insight:  Limited  Engagement in Therapy:  Limited  Modes of Intervention:  Discussion, Education, Exploration, Problem-solving, Rapport Building, Socialization and Support  Summary of Progress/Problems: MHA Speaker came to talk about his personal journey with substance abuse and addiction. The pt processed ways by which to relate to the speaker. Ajo speaker provided handouts and educational information pertaining to groups and services offered by the [facility].   Smart, Alysson Geist LCSWA 10/13/2014, 1:27 PM

## 2014-10-13 NOTE — Progress Notes (Signed)
D:  Patient's self inventory sheet, patient has poor sleep, sleep medication not helpful.  Good appetite, low energy level, good concentration.  Rated depression, hopeless or anxiety #8.  Denied withdrawals.  SI, no plan, contracts for safety.  Rash on abd, neck, arms.  Worst pain in past 24 hours #7, legs, hands, feet.  Pain medication is helpful.  Goal is to work on anxiety.  Plans to be patient today.  No discharge plans.  No problems anticipated after discharge. A:  Medications administered per MD orders.  Emotional support and encouragement given patient. R:  Denied HI.  Denied A/V hallucinations.  SI, no plan, contracts for safety.  Safety maintained with 15 minute checks. Patient ambulates with wheelchair.  Patient in better mood today after getting his Norco prn.

## 2014-10-13 NOTE — BHH Group Notes (Signed)
Adult Psychoeducational Group Note  Date:  10/13/2014 Time:  9:52 PM  Group Topic/Focus:  Wrap-Up Group:   The focus of this group is to help patients review their daily goal of treatment and discuss progress on daily workbooks.  Participation Level:  Did Not Attend  Participation Quality:  None  Affect:  None  Cognitive:  None  Insight: None  Engagement in Group:  None  Modes of Intervention:  Discussion  Additional Comments:  [name] [name] did not attend group.  [name] A 10/13/2014, 9:52 PM

## 2014-10-13 NOTE — Clinical Social Work Note (Signed)
CSW met with pt individually. Pt is reporting that he needs to speak with a family member and a friend before he can talk about discharge plans. Pt is not willing to discuss inpatient or outpatient options at this time. He reports "I feel the same as I did yesterday, I'm just not in pain anymore."   [place], LCSWA 10/13/2014 3:37 PM

## 2014-10-14 LAB — PROTIME-INR
INR: 1.19 (ref 0.00–1.49)
Prothrombin Time: 15.2 seconds (ref 11.6–15.2)

## 2014-10-14 MED ORDER — GABAPENTIN 100 MG PO CAPS
100.0000 mg | ORAL_CAPSULE | Freq: Three times a day (TID) | ORAL | Status: DC
  Administered 2014-10-14 – 2014-10-16 (×6): 100 mg via ORAL
  Filled 2014-10-14 (×13): qty 1

## 2014-10-14 MED ORDER — DULOXETINE HCL 30 MG PO CPEP
30.0000 mg | ORAL_CAPSULE | Freq: Every day | ORAL | Status: DC
  Administered 2014-10-15 – 2014-10-16 (×2): 30 mg via ORAL
  Filled 2014-10-14 (×5): qty 1

## 2014-10-14 MED ORDER — WARFARIN SODIUM 10 MG PO TABS
20.0000 mg | ORAL_TABLET | Freq: Once | ORAL | Status: AC
  Administered 2014-10-14: 20 mg via ORAL
  Filled 2014-10-14: qty 2

## 2014-10-14 NOTE — Progress Notes (Signed)
Recreation Therapy Notes  Date: 04.06.2016 Time: 9:30am Location: [address] Group Room   Group Topic: Stress Management  Goal Area(s) Addresses:  Patient will actively participate in stress management techniques presented during session.   Behavioral Response: Did not attend.   Laureen Ochs Naveed Humphres, LRT/CTRS  Shabre Kreher L 10/14/2014 2:15 PM
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
## 2014-10-14 NOTE — BHH Group Notes (Signed)
[facility] LCSW Aftercare Discharge Planning Group Note   10/14/2014 10:46 AM  Participation Quality:  Minimal   Mood/Affect:  Depressed and Flat  Depression Rating:  8  Anxiety Rating:  8  Thoughts of Suicide:  No Will you contract for safety?   NA  Current AVH:  No  Plan for Discharge/Comments:  Pt reports that pain "is tolerable" today and reports feeling groggy this morning. He reports no withdrawals and rates depression and anxiety as high. Pt reports that he spoke with his mother last night but has not come up with a discharge plan at this time and continues to avoid discussing aftercare options with CSW.   Transportation Means: unknown at this time   Supports: mother   Proofreader, Alicia Amel

## 2014-10-14 NOTE — Progress Notes (Signed)
D: Patient in the dayroom watching television on approach.  Patient appears sad and depressed and using a wheelchair.  Patient states he had a bad day.  Patient states everyday is a rough day for him.  Patient states he has itching to his hands and arms.  Patient also states he has pain to his cracking feet.  Patient states he has passive SI but verbally contracts for safety.  Patient denies HI and denies AVH.   A: Staff to monitor Q 15 mins for safety.  Encouragement and support offered.  Scheduled medications administered per orders.  Trazodone administered prn for sleep.  Hydrocodone administered prn for foot pain.  Vistaril administered prn for anxiety. R: Patient remains safe on the unit.  Patient attended group tonight.  Patient visible on the unit and interacting with peers.  Patient taking administered medications.

## 2014-10-14 NOTE — Progress Notes (Signed)
ANTICOAGULATION CONSULT NOTE - Follow Up Consult  Pharmacy Consult for Coumadin Indication: H/O PE/DVT  Allergies  Allergen Reactions  . Xarelto [Rivaroxaban] Dermatitis    Blisters skin  . Clindamycin/Lincomycin Dermatitis    Severe rash/dermatitis from November 2015 still present Jan 2016 from clinda    Patient Measurements: Height: 6\' 4"  (193 cm) Weight: 218 lb (98.884 kg) IBW/kg (Calculated) : 86.8   Vital Signs:    Labs:  Recent Labs  10/12/14 0622 10/13/14 0630 10/14/14 0618  LABPROT 14.3 13.2 15.2  INR 1.09 0.99 1.19    Estimated Creatinine Clearance: 99 mL/min (by C-G formula based on Cr of 1.12).   Medications:  Prescriptions prior to admission  Medication Sig Dispense Refill Last Dose  . albuterol (PROVENTIL HFA;VENTOLIN HFA) 108 (90 BASE) MCG/ACT inhaler Inhale 2 puffs into the lungs every 6 (six) hours as needed for wheezing or shortness of breath.   Past Month at Unknown time  . albuterol (PROVENTIL) (2.5 MG/3ML) 0.083% nebulizer solution Take 3 mLs (2.5 mg total) by nebulization every 6 (six) hours as needed for wheezing. 75 mL 11 over 30 days  . clobetasol cream (TEMOVATE) 8.58 % Apply 1 application topically 2 (two) times daily. Apply to legs/arms/hands twice daily as needed 30 g 0   . doxepin (SINEQUAN) 25 MG capsule Take 25 mg by mouth at bedtime as needed (sleep).   Past Week at Unknown time  . feeding supplement, ENSURE, (ENSURE) PUDG Take 1 Container by mouth 3 (three) times daily between meals. 30 Can 2 10/04/2014 at Unknown time  . hydrocerin (EUCERIN) CREA Apply 1 application topically 2 (two) times daily. 228 g 0 Past Week at Unknown time  . HYDROcodone-acetaminophen (NORCO/VICODIN) 5-325 MG per tablet Take 1-2 tablets by mouth every 4 (four) hours as needed for moderate pain. 30 tablet 0   . hydrOXYzine (ATARAX/VISTARIL) 25 MG tablet Take 1 tablet (25 mg total) by mouth 3 (three) times daily as needed for itching. 30 tablet 0 10/04/2014 at Unknown  time  . TEMAZEPAM PO Take 2 capsules by mouth at bedtime.   Past Month at Unknown time  . warfarin (COUMADIN) 5 MG tablet Take 12.5-15 mg by mouth daily. Take 15mg  on Monday, Tuesday, Wednesday, Saturday and Sunday.  12.5mg  on Thursday and Friday.   10/04/2014 at Unknown time    Assessment: INR increased to day to 1.19.  Patient has stated that he does not want to start lovenox. Currently no problems with anticoagulation noted.   Goal of Therapy:  INR 2-3    Plan:  Coumadin 20 mg x 1 today  P/T/INR in am   Lenox Ponds 10/14/2014,8:40 AM

## 2014-10-14 NOTE — Progress Notes (Signed)
Pt affect/ mood anxious, depressed and restricted. Passive SI, verbally contracts for safety. -HI, -A/[place]. Pt isolates to room, in bed and didn't attend group this evening. C/o feet pain, see MAR. Emotional support and encouragement given. Will continue to monitor closely and evaluate for stabilization.

## 2014-10-14 NOTE — Progress Notes (Signed)
Patient ID: [name] [name], male   DOB: 1966/02/20, 49 y.o.   MRN: [id number] D: Patient denies HI  and auditory and visual hallucinations. Positive for passive SI.  Patient has a depressed mood and affect.  Isolating to room.  A: Patient given emotional support from RN. Patient given medications per MD orders. Patient encouraged to attend groups and unit activities. Patient encouraged to come to staff with any questions or concerns.  R: Patient remains cooperative. Contracts for safety. Will continue to monitor patient for safety.

## 2014-10-14 NOTE — BHH Group Notes (Signed)
[facility] LCSW Group Therapy  10/14/2014 4:17 PM  Type of Therapy:  Group Therapy  Participation Level:  None  Participation Quality:  Drowsy  Affect:  Appropriate  Cognitive:  Lacking  Insight:  Poor  Engagement in Therapy:  Poor  Modes of Intervention:  Confrontation, Discussion, Education, Exploration, Problem-solving, Rapport Building, Socialization and Support  Summary of Progress/Problems: Emotion Regulation: This group focused on both positive and negative emotion identification and allowed group members to process ways to identify feelings, regulate negative emotions, and find healthy ways to manage internal/external emotions. Group members were asked to reflect on a time when their reaction to an emotion led to a negative outcome and explored how alternative responses using emotion regulation would have benefited them. Group members were also asked to discuss a time when emotion regulation was utilized when a negative emotion was experienced. Pt came to group during last ten minutes and promptly fell asleep. Pt continues to demonstrate no progress in the group setting and an unwillingness to discuss aftercare plan or options.   Smart, [name] [name] LCSWA  10/14/2014, 4:17 PM

## 2014-10-15 LAB — PROTIME-INR
INR: 1.72 — ABNORMAL HIGH (ref 0.00–1.49)
Prothrombin Time: 20.3 seconds — ABNORMAL HIGH (ref 11.6–15.2)

## 2014-10-15 MED ORDER — WARFARIN SODIUM 7.5 MG PO TABS
15.0000 mg | ORAL_TABLET | Freq: Once | ORAL | Status: AC
  Administered 2014-10-15: 15 mg via ORAL
  Filled 2014-10-15: qty 2

## 2014-10-15 NOTE — Progress Notes (Signed)
D:Patient in the dayroom watching television with a peer.  Patient states he had a better day.  Patient states he has been up out of his wheelchair most of the day.  Patient states he has been taking his medications and thinks they are working.  Patient states he is passive SI but verbally contracts for safety.  Patient states it has been less and less.  Patient denies HI and denies AVH.   A: Staff to monitor Q 15 mins for safety.  Encouragement and support offered.  Scheduled medications administered per orders.  Vistaril administered prn.  Trazodone administered prn.  Hydrocodone administered prn. R: Patient remains safe on the unit.  Patient attended group tonight.  Patient visible on the unit and interacting with peers.  Patient taking administered medications.

## 2014-10-15 NOTE — Progress Notes (Signed)
Patient ID: [name] [name], male   DOB: [birth year], 49 y.o.   MRN: [id number] D. Patient has been in dayroom most of day. Has spent a lot of time on phone calls.  States he is depressed and is positive for SI. Denies HI and AVH. Has itching rash over most of body. Using a wheelchair due to pain in feet and legs. A.  Medication given as ordered by MD. Encouraged to attend groups and participate. R. Contracts verbally for safety. Attending groups. Monitor for safety every 15 minutes.
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
## 2014-10-15 NOTE — BHH Group Notes (Signed)
[facility] Group Notes:  (Nursing/MHT/Case Management/Adjunct)  Date:  10/15/2014  Time:  0900   Type of Therapy:  Nurse Education  Participation Level:  Active  Participation Quality:  Appropriate  Affect:  Appropriate  Cognitive:  Appropriate  Insight:  Appropriate and Lacking  Engagement in Group:  Lacking  Modes of Intervention:  Discussion and Education  Summary of Progress/Problems:  Marissa Calamity 10/15/2014, 9:34 AM

## 2014-10-15 NOTE — BHH Group Notes (Signed)
[facility] LCSW Group Therapy 10/15/2014 1:15 PM Type of Therapy: Group Therapy Participation Level: Minimal  Participation Quality: Minimal  Affect: Depressed and Flat  Cognitive: Alert and Oriented  Insight: Developing/Improving and Engaged  Engagement in Therapy: Developing/Improving and Engaged  Modes of Intervention: Activity, Clarification, Confrontation, Discussion, Education, Exploration, Limit-setting, Orientation, Problem-solving, Rapport Building, Art therapist, Socialization and Support  Summary of Progress/Problems: Patient was attentive and engaged with speaker from [facility]. Patient was attentive to speaker while they shared their story of dealing with mental health and overcoming it. Patient expressed interest in their programs and services and received information on their agency. Patient processed ways they can relate to the speaker. Patient was observed sleeping through majority of speaker's presentation.  Tilden Fossa, MSW, [place] Worker [facility] [phone]

## 2014-10-15 NOTE — Progress Notes (Signed)
ANTICOAGULATION CONSULT NOTE - Follow Up Consult  Pharmacy Consult for Coumadin Indication: H/O PE/DVT  Allergies  Allergen Reactions  . Xarelto [Rivaroxaban] Dermatitis    Blisters skin  . Clindamycin/Lincomycin Dermatitis    Severe rash/dermatitis from November 2015 still present Jan 2016 from clinda    Patient Measurements: Height: 6\' 4"  (193 cm) Weight: 218 lb (98.884 kg) IBW/kg (Calculated) : 86.8 Heparin Dosing Weight:   Vital Signs:    Labs:  Recent Labs  10/13/14 0630 10/14/14 0618 10/15/14 0635  LABPROT 13.2 15.2 20.3*  INR 0.99 1.19 1.72*    Estimated Creatinine Clearance: 99 mL/min (by C-G formula based on Cr of 1.12).   Medications:  Scheduled:  . DULoxetine  30 mg Oral Daily  . gabapentin  100 mg Oral TID  . nicotine  14 mg Transdermal Daily  . warfarin  15 mg Oral ONCE-1800  . Warfarin - Pharmacist Dosing Inpatient   Does not apply q1800   PRN: acetaminophen, albuterol, alum & mag hydroxide-simeth, clobetasol cream, doxepin, HYDROcodone-acetaminophen, hydrOXYzine, magnesium hydroxide, traZODone  Assessment: INR increased to 1.72. No problems with anticoagulation noted. Goal of Therapy:  INR 2-3    Plan:  Coumadin 15mg  x 1 today. PT/INR in AM.    Gypsy Decant 10/15/2014,8:22 AM

## 2014-10-15 NOTE — Clinical Social Work Note (Signed)
CSW met with patient to discuss discharge plans. Patient was in bed with his eyes closed. Patient was barely audible during and mumbled during conversation. Patient reported that he has not put a plan in place for when he leaves the hospital. Patient denies that he has a safe place to discharge to. Patient declined to discuss interest in residential treatment programs as opposed to outpatient. Patient reports that he has no income and is unable to work for extended periods of time due to issues with his legs. CSW informed patient that he may be ineligible for work recovery programs due to his leg issues and inability to work. Patient declines interest in staying at a shelter at discharge. CSW encouraged patient to actively participate in the discharge planning process. CSW will continue to follow to assist with disposition and support.  Tilden Fossa, MSW, [place] Worker [facility] [phone]

## 2014-10-15 NOTE — Progress Notes (Signed)
Pt attended and was engaged in [place].

## 2014-10-16 LAB — PROTIME-INR
INR: 2 — ABNORMAL HIGH (ref 0.00–1.49)
Prothrombin Time: 22.9 seconds — ABNORMAL HIGH (ref 11.6–15.2)

## 2014-10-16 MED ORDER — WARFARIN SODIUM 2.5 MG PO TABS
12.5000 mg | ORAL_TABLET | Freq: Once | ORAL | Status: AC
  Administered 2014-10-16: 12.5 mg via ORAL
  Filled 2014-10-16: qty 1

## 2014-10-16 MED ORDER — GABAPENTIN 100 MG PO CAPS
200.0000 mg | ORAL_CAPSULE | Freq: Three times a day (TID) | ORAL | Status: DC
  Administered 2014-10-16 – 2014-10-19 (×10): 200 mg via ORAL
  Filled 2014-10-16 (×6): qty 2
  Filled 2014-10-16 (×2): qty 84
  Filled 2014-10-16 (×5): qty 2
  Filled 2014-10-16: qty 84
  Filled 2014-10-16: qty 2

## 2014-10-16 MED ORDER — DULOXETINE HCL 60 MG PO CPEP
60.0000 mg | ORAL_CAPSULE | Freq: Every day | ORAL | Status: DC
  Administered 2014-10-17 – 2014-10-19 (×3): 60 mg via ORAL
  Filled 2014-10-16: qty 14
  Filled 2014-10-16 (×5): qty 1

## 2014-10-16 NOTE — Progress Notes (Signed)
  [facility] Adult Case Management Discharge Plan :  Will you be returning to the same living situation after discharge:  Yes,  home with mom and stepdad until [facility] admission At discharge, do you have transportation home?:Yes, mother coming on Sunday 10/18/14 to pick up pt in the afternoon. Do you have the ability to pay for your medications: Yes,  mental health  Release of information consent forms completed and submitted to Medical Records by CSW.  Patient to Follow up at: Follow-up Information    Follow up with [facility] On 10/20/2014.   Why:  Screening for possible admission on this date at 8:00AM sharp. Please bring photo ID.    Contact information:   [address] [place], [name] [address] Phone: [phone] Fax: [phone]      Follow up with [facility] .   Why:  Please call Zollie Beckers ext [phone] to schedule assessment for medication managment/SAIOP/therapy services after treatment at [facility].    Contact information:   [address] [place], [place] [address] Phone: [phone] Fax: [phone]      Patient denies SI/HI: Yes,  during group/self report.     Safety Planning and Suicide Prevention discussed: Yes,  SPE completed with pt, as he refused to consent to family contact. SPI pamphlet provided to pt and he was encouraged to share information with support network, ask questions, and talk about any concerns relating to SPE.  Have you used any form of tobacco in the last 30 days? (Cigarettes, Smokeless Tobacco, Cigars, and/or Pipes): Yes  Has patient been referred to the Quitline?: Yes, faxed on 10/16/14  Smart, Nira Conn [place]  10/16/2014, 3:43 PM

## 2014-10-16 NOTE — BHH Suicide Risk Assessment (Signed)
[facility] INPATIENT:  Family/Significant Other Suicide Prevention Education  Suicide Prevention Education:  Patient Refusal for Family/Significant Other Suicide Prevention Education: The patient [name] [name] has refused to provide written consent for family/significant other to be provided Family/Significant Other Suicide Prevention Education during admission and/or prior to discharge.  Physician notified.  SPE completed with pt, as he refused to allow family contact for SPE. SPI pamphlet provided to pt and he was encouraged to share information with support network, ask questions, and talk about any concerns relating to SPE. Pt denies access to guns/firearms.   Smart, Kamila Broda LCSWA  10/16/2014, 12:38 PM

## 2014-10-16 NOTE — BHH Group Notes (Signed)
[facility] LCSW Aftercare Discharge Planning Group Note   10/16/2014 10:03 AM  Participation Quality:  Invited-DID NOT ATTEND. Pt seen walking in hallway but chose not to attend d/c planning group.   Smart, [employer]

## 2014-10-16 NOTE — Tx Team (Signed)
Interdisciplinary Treatment Plan Update (Adult)   Date: 10/16/2014   Time Reviewed: 10:41 AM  Progress in Treatment:  Attending groups: Minimally  Participating in groups:  Pt tends to fall asleep when he attends group. Minimal to no participation.  Taking medication as prescribed: Yes  Tolerating medication: Yes  Family/Significant othe contact made: pt refused to consent to family contact. SPE completed with pt. Patient understands diagnosis: Yes, AEB seeking treatment for cocaine/marijuana/alcohol abuse, SI with plan, depression/mood instability, and for medication stabilization.  Discussing patient identified problems/goals with staff: Yes  Medical problems stabilized or resolved: Yes  Denies suicidal/homicidal ideation: Passive SI/able to contract for safety on unit.  Patient has not harmed self or Others: Yes  New problem(s) identified:  Discharge Plan or Barriers: Pt continues to avoid discussion of aftercare, despite multiple attempts by CSW. Pt avoids d/c planning groups and refuses to listen to his options (inpatient/outpatient). (SEE CSW notes)  Additional comments: [name] [name] is an 49 y.o. male who reports being transported to [facility] by [facility] after he presented there with SI with a plan. The Patient reports having a rash that has lasted for the past 5 months. He reports the rash has caused him to lose his job and subsequently he lost his housing. The Patient presented orientated x4, mood "depressed", affect depressed and irritable, denied AVH and HI, and reports current SI with a plan to overdose on a "stash of pills." The Patient reports having a previous mental health diagnosis of Bipolar and feeling depressed, tearful, no appetite, and angry. He reports sleeping 4 hours per night. The Patient reports being hospitalized at [facility] a month ago for SI with a plan. The Patient reports consuming alcohol yesterday, taking a puff off a Cannabis joint yesterday, and using 4 oz  of cocaine yesterday. The Patient reports not taking prescribed Zoloft for the past month due to no money.   10/16/14: Pt continue to minimally attend groups and is continuing to refuse to discuss discharge/after care plan with CSW. Pt reports that meds are helping with his pain and depression. He reports no withdrawals. Pt continues to report passive SI at times but is able to contract for safety on the unit.  Reason for Continuation of Hospitalization: Medication management Depression Passive SI Estimated length of stay: 1-3 days  For review of initial/current patient goals, please see plan of care.  Attendees:  Patient:    Family:    Physician: Dr. Sabra Heck MD 10/16/2014 10:41 AM   Nursing: Chong Sicilian RN; Noelle RN  10/16/2014 10:41 AM   Clinical Social Worker [place], [place]  10/16/2014 10:41 AM   Other: Caryn Bee. LCSWLeana Gamer 10/16/2014 10:41 AM   Other: Gerline Legacy Nurse CM 10/16/2014 10:41 AM   Other: Hilda Lias, Community Care Coordinator  10/16/2014 10:41 AM   Other:    Scribe for Treatment Team:  Maxie Better Willcox 10/16/2014 10:41 AM
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
## 2014-10-16 NOTE — Progress Notes (Signed)
ANTICOAGULATION CONSULT NOTE - Follow Up Consult  Pharmacy Consult for Coumadin Indication: H/O PE/DVT  Allergies  Allergen Reactions  . Xarelto [Rivaroxaban] Dermatitis    Blisters skin  . Clindamycin/Lincomycin Dermatitis    Severe rash/dermatitis from November 2015 still present Jan 2016 from clinda    Patient Measurements: Height: 6\' 4"  (193 cm) Weight: 218 lb (98.884 kg) IBW/kg (Calculated) : 86.8 Heparin Dosing Weight:   Vital Signs: Temp: 97.7 F (36.5 C) (04/08 0644) Temp Source: Oral (04/08 0644) BP: 126/80 mmHg (04/08 0644) Pulse Rate: 66 (04/08 0644)  Labs:  Recent Labs  10/14/14 0618 10/15/14 0635 10/16/14 0636  LABPROT 15.2 20.3* 22.9*  INR 1.19 1.72* 2.00*    Estimated Creatinine Clearance: 99 mL/min (by C-G formula based on Cr of 1.12).   Medications:  Scheduled:  . DULoxetine  30 mg Oral Daily  . gabapentin  100 mg Oral TID  . nicotine  14 mg Transdermal Daily  . warfarin  12.5 mg Oral ONCE-1800  . Warfarin - Pharmacist Dosing Inpatient   Does not apply q1800   PRN: acetaminophen, albuterol, alum & mag hydroxide-simeth, clobetasol cream, doxepin, HYDROcodone-acetaminophen, hydrOXYzine, magnesium hydroxide, traZODone  Assessment: INR increased to just being therapeutic today. No problems with anticoagulation noted. Goal of Therapy:  INR 2-3    Plan:  Coumadin 12.5mg  po X1 today. PT/INR in AM.  Gypsy Decant 10/16/2014,9:02 AM

## 2014-10-16 NOTE — BHH Group Notes (Signed)
[place] LCSW Group Therapy  10/16/2014 12:42 PM  Type of Therapy:  Group Therapy  Participation Level:  Active  Participation Quality:  Drowsy  Affect:  Lethargic  Cognitive:  Oriented  Insight:  Improving  Engagement in Therapy:  Improving  Modes of Intervention:  Confrontation, Discussion, Education, Exploration, Problem-solving, Rapport Building, Socialization and Support  Summary of Progress/Problems: Feelings around Relapse. Group members discussed the meaning of relapse and shared personal stories of relapse, how it affected them and others, and how they perceived themselves during this time. Group members were encouraged to identify triggers, warning signs and coping skills used when facing the possibility of relapse. Social supports were discussed and explored in detail. Post Acute Withdrawal Syndrome (handout provided) was introduced and examined. Pt's were encouraged to ask questions, talk about key points associated with PAWS, and process this information in terms of relapse prevention. Ilay was drowsy but attempted to remain attentive during today's processing group. He shared that he expereinced irritability, anxiety, and lack of motivation "but never knew that it was post acute withdrawal syndrome." Quintel acknowledged the importance of having a safety plan in order to reduce the risk of relapse. He identified his mother and stepfather as his two primary social supports.    Smart, Caressa Scearce LCSWA  10/16/2014, 12:42 PM

## 2014-10-16 NOTE — Progress Notes (Signed)
Pt did not attend group. 

## 2014-10-16 NOTE — Progress Notes (Signed)
Adult Psychoeducational Group Note  Date:  10/16/2014 Time:  8:00pm   Group Topic/Focus:  Wrap-Up Group:   The focus of this group is to help patients review their daily goal of treatment and discuss progress on daily workbooks.  Participation Level:  Did Not Attend   Additional Comments:  Pt didn't attend AA group.   Theodoro Grist D 10/16/2014, 10:14 PM

## 2014-10-16 NOTE — Progress Notes (Signed)
Recreation Therapy Notes  Date: 04.08.2016 Time: 9:30am Location: [address] Group Room   Group Topic: Stress Management  Goal Area(s) Addresses:  Patient will actively participate in stress management techniques presented during session.   Behavioral Response: Did not attend.   Laureen Ochs Kareli Hossain, LRT/CTRS  Lane Hacker 10/16/2014 5:58 PM

## 2014-10-16 NOTE — Progress Notes (Signed)
D: Pt denies SI/HI/AV. Pt is pleasant and cooperative. Pt rates depression at a 6, anxiety at a 6, and Helplessness/hopelessness at a 7.  A: Pt was offered support and encouragement. Pt was given scheduled medications and prn. Pt was encourage to attend groups. Q 15 minute checks were done for safety.  R:Pt attends has not attend groups today and stays to himself but does interacts well with peers and staff. Pt  taking medication. Pt has no complaints.Pt receptive to treatment and safety maintained on unit.

## 2014-10-17 DIAGNOSIS — F332 Major depressive disorder, recurrent severe without psychotic features: Principal | ICD-10-CM

## 2014-10-17 LAB — PROTIME-INR
INR: 1.96 — ABNORMAL HIGH (ref 0.00–1.49)
Prothrombin Time: 22.5 seconds — ABNORMAL HIGH (ref 11.6–15.2)

## 2014-10-17 MED ORDER — WARFARIN SODIUM 7.5 MG PO TABS
15.0000 mg | ORAL_TABLET | Freq: Once | ORAL | Status: AC
  Administered 2014-10-17: 15 mg via ORAL
  Filled 2014-10-17: qty 2

## 2014-10-17 NOTE — Progress Notes (Signed)
ANTICOAGULATION CONSULT NOTE - Follow Up Consult  Pharmacy Consult for Coumadin Indication: H/O PE/DVT  Allergies  Allergen Reactions  . Xarelto [Rivaroxaban] Dermatitis    Blisters skin  . Clindamycin/Lincomycin Dermatitis    Severe rash/dermatitis from November 2015 still present Jan 2016 from clinda    Patient Measurements: Height: 6\' 4"  (193 cm) Weight: 218 lb (98.884 kg) IBW/kg (Calculated) : 86.8 Heparin Dosing Weight:   Vital Signs: Temp: 98.1 F (36.7 C) (04/09 0626) Temp Source: Oral (04/09 0626) BP: 122/90 mmHg (04/09 0627) Pulse Rate: 103 (04/09 0627)  Labs:  Recent Labs  10/15/14 0635 10/16/14 0636 10/17/14 0630  LABPROT 20.3* 22.9* 22.5*  INR 1.72* 2.00* 1.96*    Estimated Creatinine Clearance: 99 mL/min (by C-G formula based on Cr of 1.12).   Medications:  Scheduled:  . DULoxetine  60 mg Oral Daily  . gabapentin  200 mg Oral TID  . nicotine  14 mg Transdermal Daily  . warfarin  15 mg Oral ONCE-1800  . Warfarin - Pharmacist Dosing Inpatient   Does not apply q1800   PRN: acetaminophen, albuterol, alum & mag hydroxide-simeth, clobetasol cream, doxepin, HYDROcodone-acetaminophen, hydrOXYzine, magnesium hydroxide, traZODone  Assessment: INR is just below goal range of 2-3 at 1.96. No problems with anticoagulation noted. Goal of Therapy:  INR 2-3    Plan:  Coumadin 15mg  po x 1 today. PT/INR in AM.  Gypsy Decant 10/17/2014,1:33 PM

## 2014-10-17 NOTE — BHH Group Notes (Signed)
[place] Group Notes:  (Clinical Social Work)  10/17/2014   1:15-2:15PM  Summary of Progress/Problems:   The main focus of today's process group was for the patient to identify ways in which they have sabotaged their own mental health wellness/recovery.  Motivational interviewing and a handout were used to explore the benefits and costs of their self-sabotaging behavior as well as the benefits and costs of changing this behavior.  The Stages of Change were explained to the group using a handout, and patients identified where they are with regard to changing self-defeating behaviors.  The patient expressed he uses laughter/humor as a healthy coping skills.  He was insightful, supportive of others, attentive, throughout group.,   Type of Therapy:  Process Group  Participation Level:  Active  Participation Quality:  Attentive, Sharing and Supportive  Affect:  Blunted  Cognitive:  Appropriate and Oriented  Insight:  Engaged  Engagement in Therapy:  Engaged  Modes of Intervention:  Education, Motivational Interviewing   Selmer Dominion, LCSW 10/17/2014, 4:00pm

## 2014-10-17 NOTE — Progress Notes (Signed)
Psychoeducational Group Note  Date: 10/17/2014 Time:  1015  Group Topic/Focus:  Identifying Needs:   The focus of this group is to help patients identify their personal needs that have been historically problematic and identify healthy behaviors to address their needs.  Participation Level:  Active  Participation Quality:  Appropriate  Affect:  Appropriate  Cognitive:  Oriented  Insight:  Improving  Engagement in Group:  Engaged  Additional Comments:  Pt attended this group and participated   Paulino Rily
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
## 2014-10-17 NOTE — Progress Notes (Signed)
D) Pt has been attending the groups and interacting with his peers. Mood is depressed and affect is flat. Pt denies SI and HI. Rates his depression at a 6, his hopelessness at a 7 and his anxiety at a 9. Has not used his wheelchair today and has actually been doing a lot of joking around with one of the other Pt's. States he is feeling a little better. "at least I don't want to actively act out my thoughts of hurting myself." His goal today is to keep himself safe and to not dwell on bad thoughts. A) given support and reassurance along with praise. Encouraged to go to the groups and to participate in them. Encouraged to do his packet for Saturday as well. R) Pt improving a bit and feeling a bit better.

## 2014-10-17 NOTE — Progress Notes (Signed)
[facility] Group Notes:  (Nursing/MHT/Case Management/Adjunct)  Date:  10/17/2014  Time:  2100 Type of Therapy:  wrap up group  Participation Level:  Active  Participation Quality:  Appropriate, Attentive, Intrusive, Sharing and Supportive  Affect:  Appropriate  Cognitive:  Appropriate  Insight:  Improving  Engagement in Group:  Distracting and Engaged  Modes of Intervention:  Clarification, Education and Support  Summary of Progress/Problems: Pt interjected often when others were speaking usually to be humorous  although,  pt was engaged in group.  Pt shared that he had been dealing with a lot of depression and self hate before coming here and hopes to go to [facility] at discharge.   Jacques Navy 10/17/2014, 10:58 PM

## 2014-10-17 NOTE — Progress Notes (Signed)
D: Pt has depressed affect and mood.  Pt reports that he did not have a goal for the day.  Pt denies SI/HI, denies hallucinations.  Pt did not attend evening group.  Pt was visible in the milieu after group and was walking without assistance.   A: Introduced self to pt and met with pt 1:1.  Support and encouragement offered.  PRN medications administered for pain, itching, and sleep, see flow sheet.  PO fluids encouraged and provided.   R: Pt has been compliant with medications.  Pt verbally contracts for safety.  Will continue to monitor and assess.

## 2014-10-17 NOTE — Progress Notes (Signed)
.  Psychoeducational Group Note    Date: 10/17/2014 Time:  0930    Goal Setting Purpose of Group: To be able to set a goal that is measurable and that can be accomplished in one day Participation Level:  Active  Participation Quality:  Appropriate  Affect:  Appropriate  Cognitive:  Oriented  Insight:  Improving  Engagement in Group:  Engaged  Additional Comments:  Pt shared with the group his goal for the day.  Paulino Rily

## 2014-10-17 NOTE — Plan of Care (Signed)
Problem: Diagnosis: Increased Risk For Suicide Attempt Goal: STG-Patient Will Attend All Groups On The Unit Outcome: Not Progressing Pt did not attend evening group on 10/16/14.  Problem: Alteration in mood & ability to function due to Goal: LTG-Pt reports reduction in suicidal thoughts (Patient reports reduction in suicidal thoughts and is able to verbalize a safety plan for whenever patient is feeling suicidal)  Outcome: Progressing Pt denied SI tonight.  He verbally contracted for safety.

## 2014-10-18 LAB — PROTIME-INR
INR: 2.45 — ABNORMAL HIGH (ref 0.00–1.49)
PROTHROMBIN TIME: 26.8 s — AB (ref 11.6–15.2)

## 2014-10-18 MED ORDER — WARFARIN SODIUM 2.5 MG PO TABS: 12.5000 mg | ORAL_TABLET | Freq: Once | ORAL | Status: DC

## 2014-10-18 MED ORDER — WARFARIN SODIUM 2.5 MG PO TABS
12.5000 mg | ORAL_TABLET | Freq: Once | ORAL | Status: AC
  Administered 2014-10-18: 12.5 mg via ORAL
  Filled 2014-10-18: qty 1

## 2014-10-18 NOTE — Progress Notes (Signed)
Patient ID: [name] [name], male   DOB: Jan 18, 1966, 49 y.o.   MRN: [id number]   D: Pt has been very flat and depressed on the unit today, pt was for discharge today but reported that he was not ready. Pt reported that he felt the worst that he has felt since being at [facility], and that today was not a good today for him to discharge. Pt reported that his depression was a 9, his hopelessness was a 8, and that his anxiety was a 10. Pt reported that his goal for today was to get anxiety under control. Pt reported being negative SI/HI, no AH/VH noted. A: 15 min checks continued for patient safety. R: Pt safety maintained.

## 2014-10-18 NOTE — [facility] Group Notes (Signed)
[facility] Group Notes:  (Clinical Social Work)  10/18/2014   1:15-2:15PM  Summary of Progress/Problems:  The main focus of today's process group was to   identify the patient's current support system and decide on other supports that can be put in place.  The picture on workbook was used to discuss why additional supports are needed.  An emphasis was placed on using counselor, doctor, therapy groups, 12-step groups, and problem-specific support groups to expand supports.   There was also an extensive discussion about what constitutes a healthy support versus an unhealthy support.  The patient expressed that his mother and ex-girlfriend are healthy supports, while his brother-in-law is unhealthy for him.  He then appeared to sleep through the remainder of group.  However, he did awaken during discussion on using a sponsor if involved in [facility], and made the statement to group that some people achieve success without a sponsor.  He was able to accept that chances of success are increased with a sponsor.  Type of Therapy:  Process Group  Participation Level:  Minimal  Participation Quality:  Drowsy  Affect:  Blunted and Depressed  Cognitive:  Appropriate  Insight:  Limited  Engagement in Therapy:  Limited  Modes of Intervention:  Education,  Support and [employer], LCSW 10/18/2014, 4:00pm

## 2014-10-18 NOTE — Progress Notes (Signed)
Psychoeducational Group Note  Date:  10/18/2014 Time:  1015  Group Topic/Focus:  Making Healthy Choices:   The focus of this group is to help patients identify negative/unhealthy choices they were using prior to admission and identify positive/healthier coping strategies to replace them upon discharge.  Participation Level:  Active  Participation Quality:  Appropriate  Affect:  Appropriate  Cognitive:  Oriented  Insight:  Improving  Engagement in Group:  Engaged  Additional Comments:  Pt engaged and participating in the group. Good input  Paulino Rily 10/18/2014

## 2014-10-18 NOTE — [facility] Group Notes (Signed)
[facility] Group Notes:  (Nursing/MHT/Case Management/Adjunct)  Date:  10/18/2014  Time:  10:10 AM  Type of Therapy:  Psychoeducational Skills  Participation Level:  Did Not Attend  Participation Quality:  Did Not Attend  Affect:  Did Not Attend  Cognitive:  Did Not Attend  Insight:  None  Engagement in Group:  Did Not Attend  Modes of Intervention:  Did Not Attend  Summary of Progress/Problems: Pt did not attend patient self inventory group.     Benancio Deeds Shanta 10/18/2014, 10:10 AM
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
## 2014-10-18 NOTE — Progress Notes (Signed)
D: Pt has been visible in the milieu this shift.  He attended and participated in evening group.  Pt has depressed affect and mood.  He reported his goal today was "to stay awake" and that he accomplished his goal.  Pt denies SI/HI, denies hallucinations.  Pt reported he went to groups today and that he found them helpful.  Pt has been ambulating without his wheelchair this shift. A: Met with pt 1:1 and provided support and encouragement.  PRN medications administered for pain, itching, and sleep.     R: Pt has been more interactive with peers this shift compared to last night.  He verbally contracts for safety and reports he will notify staff of needs and concerns.  Will continue to monitor and assess.

## 2014-10-18 NOTE — Progress Notes (Signed)
Patient did attend the evening speaker AA meeting.  

## 2014-10-18 NOTE — Progress Notes (Signed)
Psychoeducational Group Note  Date: 10/18/2014 Time:0930 Group Topic/Focus:  Gratefulness:  The focus of this group is to help patients identify what two things they are most grateful for in their lives. What helps ground them and to center them on their work to their recovery.  Participation Level:  Active  Participation Quality:  Appropriate  Affect:  Appropriate  Cognitive:  Oriented  Insight:  Improving  Engagement in Group:  Engaged  Additional Comments:  Pt participated in the group and shared thoughts and feelings.  [name] [name]

## 2014-10-18 NOTE — Progress Notes (Signed)
ANTICOAGULATION CONSULT NOTE - Follow Up Consult  Pharmacy Consult for Coumadin Indication: H/O of PE/DVT  Allergies  Allergen Reactions  . Xarelto [Rivaroxaban] Dermatitis    Blisters skin  . Clindamycin/Lincomycin Dermatitis    Severe rash/dermatitis from November 2015 still present Jan 2016 from clinda    Patient Measurements: Height: 6\' 4"  (193 cm) Weight: 218 lb (98.884 kg) IBW/kg (Calculated) : 86.8 Heparin Dosing Weight:   Vital Signs: Temp: 98.6 F (37 C) (04/10 0615) Temp Source: Oral (04/10 0615) BP: 119/83 mmHg (04/10 0616) Pulse Rate: 93 (04/10 0616)  Labs:  Recent Labs  10/16/14 0636 10/17/14 0630 10/18/14 0615  LABPROT 22.9* 22.5* 26.8*  INR 2.00* 1.96* 2.45*    Estimated Creatinine Clearance: 99 mL/min (by C-G formula based on Cr of 1.12).   Medications:  Scheduled:  . DULoxetine  60 mg Oral Daily  . gabapentin  200 mg Oral TID  . nicotine  14 mg Transdermal Daily  . warfarin  12.5 mg Oral ONCE-1800  . Warfarin - Pharmacist Dosing Inpatient   Does not apply q1800    Assessment: INR is at goal 2.45 (range 2-3). No problems with anticoagulation noted. Goal of Therapy:  INR 2-3    Plan:  Coumadin 12.5mg  po x 1 today Will get PT/INR on 4/12.  Gypsy Decant 10/18/2014,12:30 PM

## 2014-10-18 NOTE — Plan of Care (Signed)
Problem: Alteration in mood & ability to function due to Goal: STG-Patient will attend groups Outcome: Progressing Pt attended and participated in evening group on 10/17/14.

## 2014-10-19 LAB — PROTIME-INR
INR: 2.54 — ABNORMAL HIGH (ref 0.00–1.49)
Prothrombin Time: 27.6 seconds — ABNORMAL HIGH (ref 11.6–15.2)

## 2014-10-19 MED ORDER — WARFARIN SODIUM 2.5 MG PO TABS: 12.5000 mg | ORAL_TABLET | Freq: Every day | ORAL | Status: DC

## 2014-10-19 MED ORDER — DULOXETINE HCL 60 MG PO CPEP: 60.0000 mg | ORAL_CAPSULE | Freq: Every day | ORAL | Status: DC

## 2014-10-19 MED ORDER — HYDROXYZINE HCL 25 MG PO TABS: 25.0000 mg | ORAL_TABLET | Freq: Three times a day (TID) | ORAL | Status: DC | PRN

## 2014-10-19 MED ORDER — WARFARIN SODIUM 5 MG PO TABS: 12.5000 mg | ORAL_TABLET | ORAL | Status: DC

## 2014-10-19 MED ORDER — GABAPENTIN 100 MG PO CAPS: 200.0000 mg | ORAL_CAPSULE | Freq: Three times a day (TID) | ORAL | Status: DC

## 2014-10-19 MED ORDER — TRAZODONE HCL 100 MG PO TABS: 100.0000 mg | ORAL_TABLET | Freq: Every evening | ORAL | Status: DC | PRN

## 2014-10-19 MED ORDER — DOXEPIN HCL 25 MG PO CAPS: 25.0000 mg | ORAL_CAPSULE | Freq: Every evening | ORAL | Status: DC | PRN

## 2014-10-19 MED ORDER — WARFARIN SODIUM 5 MG PO TABS
12.5000 mg | ORAL_TABLET | Freq: Every day | ORAL | Status: DC
  Administered 2014-10-19: 12.5 mg via ORAL
  Filled 2014-10-19 (×2): qty 2.5

## 2014-10-19 MED ORDER — CLOBETASOL PROPIONATE 0.05 % EX CREA: 1.0000 "application " | TOPICAL_CREAM | Freq: Two times a day (BID) | CUTANEOUS | Status: DC

## 2014-10-19 NOTE — Progress Notes (Signed)
  [facility] Adult Case Management Discharge Plan :  Will you be returning to the same living situation after discharge: Yes, home with mom and stepdad until [facility] admission At discharge, do you have transportation home?:Yes, mother or stepfather Do you have the ability to pay for your medications: Yes, mental health  Release of information consent forms completed and submitted to Medical Records by CSW.  Patient to Follow up at: Follow-up Information    Follow up with [facility] On 10/20/2014.   Why: Screening for possible admission on this date at 8:00AM sharp. Please bring photo ID.    Contact information:   [address] [place], [place] [address] Phone: [phone] Fax: [phone]      [facility] with [facility] .   Why: Please call Zollie Beckers ext [phone] to schedule assessment for medication managment/SAIOP/therapy services after treatment at [facility].    Contact information:   [address] [place], [place] [address] Phone: [phone] Fax: [phone]      Patient denies SI/HI: Yes, during group/self report.    Safety Planning and Suicide Prevention discussed: Yes, SPE completed with pt, as he refused to consent to family contact. SPI pamphlet provided to pt and he was encouraged to share information with support network, ask questions, and talk about any concerns relating to SPE.  Have you used any form of tobacco in the last 30 days? (Cigarettes, Smokeless Tobacco, Cigars, and/or Pipes): Yes  Has patient been referred to the Quitline?: Yes, faxed on 10/16/14  Maxie Better [facility]  10/19/2014 10:46 AM

## 2014-10-19 NOTE — Progress Notes (Signed)
ANTICOAGULATION CONSULT NOTE - Initial Consult  Pharmacy Consult for Coumadin Indication: H/O PE/DVT  Allergies  Allergen Reactions  . Xarelto [Rivaroxaban] Dermatitis    Blisters skin  . Clindamycin/Lincomycin Dermatitis    Severe rash/dermatitis from November 2015 still present Jan 2016 from clinda    Patient Measurements: Height: 6\' 4"  (193 cm) Weight: 218 lb (98.884 kg) IBW/kg (Calculated) : 86.8   Vital Signs:    Labs:  Recent Labs  10/17/14 0630 10/18/14 0615 10/19/14 0620  LABPROT 22.5* 26.8* 27.6*  INR 1.96* 2.45* 2.54*    Estimated Creatinine Clearance: 99 mL/min (by C-G formula based on Cr of 1.12).   Medical History: Past Medical History  Diagnosis Date  . DVT (deep venous thrombosis)     takes Xarelto daily--- in right leg x 2 and lung x 1  . Substance abuse     crack, cocaine, last use 2007  . Tobacco abuse   . DVT (deep venous thrombosis)   . Bronchitis     last time 2 yrs ago  . Heart murmur   . Shortness of breath   . Tuberculosis     ' I test Positive "  . Asthma     uses Albuterol daily as needed  . Cough     smokers  . Arthritis     knees  . Joint pain   . GERD (gastroesophageal reflux disease)     only takes something about 2 times a yr  . Cellulitis   . Bipolar 1 disorder   . Alcohol abuse   . Pulmonary embolism 2014    Medications:  Scheduled:  . DULoxetine  60 mg Oral Daily  . gabapentin  200 mg Oral TID  . nicotine  14 mg Transdermal Daily  . warfarin  12.5 mg Oral q1800  . Warfarin - Pharmacist Dosing Inpatient   Does not apply q1800    Assessment: INR at goal  Goal of Therapy:  INR 2-3    Plan:  Coumadin 12.5 mg daily for now.  Will draw PT/INR Wed am labs.  Lenox Ponds 10/19/2014,7:38 AM

## 2014-10-19 NOTE — Progress Notes (Signed)
D:  Patient's self inventory sheet, patient sleeps good, sleep medication is helpful.  Good appetite, normal energy level, good concentration.  Rated depression and hopeless 8, anxiety 9.  Withdrawals.  SI, same plan, contracts for safety.  Physical problems of pain, rash, worst pain #8, feet, legs, pain medication is helpful.  Goal is to work on anxiety.  Does have discharge plans.  Concerned about alcohol, drugs, thoughts of SI after discharge. A:  Medications administered per MD orders.  Emotional support and encouragement given patient. R:  Denied HI.  Denied A/V hallucinations.  SI, same plan, contracts for safety.  Safety maintained with 15 minute checks. Patient has not been using wheelchair in hallway this morning.  Looking forward to his discharge today.

## 2014-10-19 NOTE — Progress Notes (Signed)
Discharge Note:  Patient discharged home with mother.  Denied SI and HI.  Denied A/V hallucinations.  Suicide prevention information given and discussed with patient who stated he understood and had no questions.  Patient stated he received all his belongings, clothing, toiletries, misc items, prescriptions, medications, phone, Games developer, wallet, cash, ID cards, head phones, papers.  Patient stated he appreciated all assistance received from [facility] staff.

## 2014-10-19 NOTE — BHH Group Notes (Signed)
[facility] LCSW Aftercare Discharge Planning Group Note   10/19/2014 10:36 AM  Participation Quality:  Minimal   Mood/Affect:  Flat  Depression Rating:  8  Anxiety Rating:  8  Thoughts of Suicide:  No Will you contract for safety?   NA  Current AVH:  No  Plan for Discharge/Comments:  Pt reports that he didn't want to d/c yesterday "because I didn't want to go to my mom's." Pt has [facility] admission [facility] Tuesday and plans to d/c today/stay night with his mother. Pt is aware that he may not get accepted and if accepted, may not have a bed for a few days. He was given numerous other resources for [facility], halfway houses/[facility] of [facility], [place], shelter info, and [facility] pamphlet for outpatient services and verbalized understanding of the above.   Transportation Means: mother or stepfather    Supports: family  Smart, Alicia Amel

## 2014-10-19 NOTE — BHH Suicide Risk Assessment (Signed)
[facility] Discharge Suicide Risk Assessment   Demographic Factors:  Male  Total Time spent with patient: 30 minutes  Musculoskeletal: Strength & Muscle Tone: within normal limits Gait & Station: normal Patient leans: N/A  Psychiatric Specialty Exam: Physical Exam  Review of Systems  Constitutional: Negative.   HENT: Negative.   Eyes: Negative.   Respiratory: Negative.   Cardiovascular: Negative.   Gastrointestinal: Negative.   Genitourinary: Negative.   Musculoskeletal: Negative.   Skin: Negative.   Neurological: Negative.   Endo/Heme/Allergies: Negative.   Psychiatric/Behavioral: Positive for depression and substance abuse.    Blood pressure 118/86, pulse 92, temperature 98.4 F (36.9 C), temperature source Oral, resp. rate 16, height 6\' 4"  (1.93 m), weight 98.884 kg (218 lb).Body mass index is 26.55 kg/(m^2).  General Appearance: Fairly Groomed  Engineer, water::  Fair  Speech:  Clear and [id number]  Volume:  Normal  Mood:  Anxious  Affect:  Restricted  Thought Process:  Coherent and Goal Directed  Orientation:  Full (Time, Place, and Person)  Thought Content:  plans as he moves on, relapse prevention plan  Suicidal Thoughts:  No  Homicidal Thoughts:  No  Memory:  Immediate;   Fair Recent;   Fair Remote;   Fair  Judgement:  Fair  Insight:  Shallow  Psychomotor Activity:  Restlessness  Concentration:  Fair  Recall:  [employer] of Knowledge:Fair  Language: Fair  Akathisia:  No  Handed:  Right  AIMS (if indicated):     Assets:  Desire for Improvement  Sleep:  Number of Hours: 6.5  Cognition: WNL  ADL's:  Intact   Have you used any form of tobacco in the last 30 days? (Cigarettes, Smokeless Tobacco, Cigars, and/or Pipes): Yes  Has this patient used any form of tobacco in the last 30 days? (Cigarettes, Smokeless Tobacco, Cigars, and/or Pipes) Yes, A prescription for an FDA-approved tobacco cessation medication was offered at discharge and the patient refused  Mental  Status Per Nursing Assessment::   On Admission:     Current Mental Status by Physician: In full contact with reality. There are no active S/S of withdrawal. There are no active SI plans or intent. He will have a screening  appointment to go into [facility] tomorrow AM.  On a side note he has been out and about interacting with other patients smiling joking ( even with this MD) .    Loss Factors: Decline in physical health  Historical Factors: NA  Risk Reduction Factors:   NA  Continued Clinical Symptoms:  Depression:   Insomnia Chronic Pain  Cognitive Features That Contribute To Risk:  Closed-mindedness, Polarized thinking and Thought constriction (tunnel vision)    Suicide Risk:  Minimal: No identifiable suicidal ideation.  Patients presenting with no risk factors but with morbid ruminations; may be classified as minimal risk based on the severity of the depressive symptoms  Principal Problem: Major depressive disorder, recurrent severe without psychotic features Discharge Diagnoses:  Patient Active Problem List   Diagnosis Date Noted  . Chronic pain syndrome [G89.4] 10/13/2014  . 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 Major depressive disorder, recurrent severe without psychotic features [F33.2] 10/11/2014  . Eczema [L30.9] 10/08/2014  . Allergic reaction [T78.40XA] 10/05/2014  . Tachycardia [R00.0] 10/05/2014  . Acute on chronic renal failure [N17.9, N18.9] 10/05/2014  . Rash and nonspecific skin eruption [R21]   . Sepsis [A41.9] 09/28/2014  . Cellulitis of right leg [L03.115] 07/22/2014  . Cellulitis [L03.90] 07/22/2014  . Tobacco abuse [Z72.0] 07/22/2014  .  Polycythemia [D75.1] 04/07/2013  . Healthcare maintenance [Z00.00] 04/07/2013  . Pulmonary embolism, bilateral [I26.99] 03/29/2013  . Allergic rhinitis [477] 08/17/2011  . Leg DVT (deep venous thromboembolism), chronic [I82.509] 08/16/2011  . Long-term (current) use of anticoagulants [Z79.01] 07/29/2010  .  Polysubstance abuse (tobacco and +UDS for cocaine) [F19.10] 07/13/2010  . Asthma, chronic [J45.909] 05/02/2010  . GERD [K21.9] 05/02/2010  . DEPRESSION [F32.9] 12/05/2006    Follow-up Information    Follow up with [facility] On 10/20/2014.   Why:  Screening for possible admission on this date at 8:00AM sharp. Please bring photo ID.    Contact information:   [address] [place], [place] [address] Phone: [phone] Fax: [phone]      [facility] .   Why:  Please call Zollie Beckers ext [phone] to schedule assessment for medication managment/SAIOP/therapy services after treatment at [facility].    Contact information:   [address] [place], [place] [address] Phone: [phone] Fax: [phone]      Plan Of Care/Follow-up recommendations:  Activity:  as tolerated Diet:  regular Follow up [facility] in the morning and [facility] Is patient on multiple antipsychotic therapies at discharge:  No   Has Patient had three or more failed trials of antipsychotic monotherapy by history:  No  Recommended Plan for Multiple Antipsychotic Therapies: NA    Kaeson Kleinert A 10/19/2014, 2:55 PM

## 2014-10-19 NOTE — Progress Notes (Signed)
D: Pt has depressed affect and mood.  Pt reports he was supposed to discharge today "but I started feeling really anxious and the suicidal thoughts came back."  Pt denies SI, stating "not right now."  Denies HI, denies hallucinations.  Pt has been visible in milieu interacting with peers and staff appropriately.  Pt attended evening group.   A: Met with pt 1:1 and provided support and encouragement. PRN medication administered for itching, pain, and sleep. R: Pt is compliant with medications.  Pt verbally contracts for safety while at [facility].  Will continue to monitor and assess.
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
## 2014-10-22 ENCOUNTER — Ambulatory Visit (INDEPENDENT_AMBULATORY_CARE_PROVIDER_SITE_OTHER): Payer: Self-pay | Admitting: Pharmacist

## 2014-10-22 DIAGNOSIS — Z7901 Long term (current) use of anticoagulants: Secondary | ICD-10-CM

## 2014-10-22 DIAGNOSIS — I82509 Chronic embolism and thrombosis of unspecified deep veins of unspecified lower extremity: Secondary | ICD-10-CM

## 2014-10-22 LAB — POCT INR: INR: 4.3

## 2014-10-22 NOTE — Progress Notes (Signed)
Medicine attending: Medical history, presenting problems, physical findings, and medications, reviewed with Dr Erik Hoffman and I concur with his evaluation and management plan. 

## 2014-10-22 NOTE — Progress Notes (Signed)
Patient Discharge Instructions:  After Visit Summary (AVS):   Faxed to:  10/22/14 Discharge Summary Note:   Faxed to:  10/22/14 Psychiatric Admission Assessment Note:   Faxed to:  10/22/14 Suicide Risk Assessment - Discharge Assessment:   Faxed to:  10/22/14 Faxed/Sent to the Next Level Care provider:  10/22/14 Faxed to [facility] @ [phone] Faxed to [facility] @ [place], 10/22/2014, 2:15 PM

## 2014-10-22 NOTE — Progress Notes (Signed)
CLINICAL PHARMACY ANTICOAGULATION MANAGEMENT Hriday A Anthes is a 49 y.o. male who reports to the clinic for monitoring of warfarin treatment.    Indication: DVT Duration: indefinite  Anticoagulation Clinic Visit History: Anticoagulation Episode Summary    Current INR goal   Next INR check 11/02/2014  INR from last check 4.3 (10/22/2014)  Weekly max dose   Target end date   INR check location   Preferred lab   Send INR reminders to [facility]   Indications  Long-term (current) use of anticoagulants [Z79.01] DVT (Resolved) [I82.409] 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]        Comments       Anticoagulation Care Providers    Provider Role Specialty Phone number   Bartholomew Crews, MD  Internal Medicine [phone]     ASSESSMENT Recent Results: Recent results are below, the most recent result is correlated with a dose of 105 mg per week: Lab Results  Component Value Date   INR 4.3 10/22/2014   INR 2.54* 10/19/2014   INR 2.45* 10/18/2014    INR today: Supratherapeutic  Anticoagulation Dosing: INR as of 10/22/2014 and Previous Dosing Information    INR Dt INR Goal [name] Sun Mon Tue Wed Thu Fri Sat   10/22/2014 4.3 - 105 mg 15 mg 15 mg 15 mg 15 mg 15 mg 15 mg 15 mg    Anticoagulation Dose Instructions as of 10/22/2014      Total Sun Mon Tue Wed Thu Fri Sat   New Dose 87.5 mg 12.5 mg 12.5 mg 12.5 mg 12.5 mg 12.5 mg 12.5 mg 12.5 mg     (5 mg x 2.5)  (5 mg x 2.5)  (5 mg x 2.5)  (5 mg x 2.5)  (5 mg x 2.5)  (5 mg x 2.5)  (5 mg x 2.5)                         Description        Hold 1 dose, then start 2.5 mg daily (patient states he is unable to schedule an earlier appointment, will try to bring him in sooner)      PLAN Weekly dose was decreased by 17% to 87.5 mg per week  Patient Instructions  Skip today's dose of warfarin. Starting tomorrow 10/23/14, take 12.5 mg daily, then return to clinic 2 weeks. Contact clinic if questions or  concerns [phone]   Follow-up Return in about 4 days (around 10/26/2014) for Follow up INR on 10/26/14 around 10 am.  Flossie Dibble Clinical Pharmacist

## 2014-10-22 NOTE — Patient Instructions (Signed)
Skip today's dose of warfarin. Starting tomorrow 10/23/14, take 12.5 mg daily, then return to clinic 2 weeks. Contact clinic if questions or concerns [phone]

## 2014-11-04 ENCOUNTER — Telehealth: Payer: Self-pay | Admitting: Pharmacist

## 2014-11-04 NOTE — Telephone Encounter (Signed)
Call to patient to confirm appointment for 11/05/14 at 10:00 and 10:30 lmtcb

## 2014-11-05 ENCOUNTER — Ambulatory Visit (INDEPENDENT_AMBULATORY_CARE_PROVIDER_SITE_OTHER): Payer: Self-pay | Admitting: Pharmacist

## 2014-11-05 ENCOUNTER — Ambulatory Visit: Payer: Self-pay

## 2014-11-05 DIAGNOSIS — I82509 Chronic embolism and thrombosis of unspecified deep veins of unspecified lower extremity: Secondary | ICD-10-CM

## 2014-11-05 DIAGNOSIS — Z7901 Long term (current) use of anticoagulants: Secondary | ICD-10-CM

## 2014-11-05 LAB — POCT INR: INR: 4.1

## 2014-11-05 NOTE — Progress Notes (Signed)
CLINICAL PHARMACY ANTICOAGULATION MANAGEMENT [name] [name] is a 49 y.o. male who reports to the clinic for monitoring of warfarin treatment.    Indication: DVTand PE Duration: indefinite  Anticoagulation Clinic Visit History: Anticoagulation Episode Summary    Current INR goal   Next INR check 11/19/2014  INR from last check 4.1 (11/05/2014)  Weekly max dose   Target end date   INR check location   Preferred lab   Send INR reminders to [facility]   Indications  Long-term (current) use of anticoagulants [Z79.01] DVT (Resolved) [I82.409] 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]        Comments       Anticoagulation Care Providers    Provider Role Specialty Phone number   Bartholomew Crews, MD  Internal Medicine [phone]     ASSESSMENT Recent Results: Recent results are below, the most recent result is correlated with a dose of 87.5 mg per week: Lab Results  Component Value Date   INR 4.1 11/05/2014   INR 4.3 10/22/2014   INR 2.54* 10/19/2014    INR today: Supratherapeutic  Anticoagulation Dosing: INR as of 11/05/2014 and Previous Dosing Information    INR Dt INR Goal Wkly Tot Sun Mon Tue Wed Thu Fri Sat   11/05/2014 4.1 - 87.5 mg 12.5 mg 12.5 mg 12.5 mg 12.5 mg 12.5 mg 12.5 mg 12.5 mg    Previous description        Hold 1 dose, then start 2.5 tablets daily (patient states he is unable to schedule an earlier appointment, will try to bring him in sooner)    Anticoagulation Dose Instructions as of 11/05/2014      Total Sun Mon Tue Wed Thu Fri Sat   New Dose 75 mg 10 mg 12.5 mg 10 mg 10 mg 10 mg 12.5 mg 10 mg     (5 mg x 2)  (5 mg x 2.5)  (5 mg x 2)  (5 mg x 2)  (5 mg x 2)  (5 mg x 2.5)  (5 mg x 2)                         Description        Hold x 1, and then decrease as indicated.      PLAN Weekly dose was decreased by 14% to 75 mg per week  Patient Instructions  Patient educated about medication as defined in this encounter and  verbalized understanding by repeating back instructions provided.     Follow-up Return in about 2 weeks (around 11/19/2014).  Flossie Dibble Clinical Pharmacist

## 2014-11-05 NOTE — Patient Instructions (Signed)
Patient educated about medication as defined in this encounter and verbalized understanding by repeating back instructions provided.   

## 2014-11-06 ENCOUNTER — Emergency Department (HOSPITAL_BASED_OUTPATIENT_CLINIC_OR_DEPARTMENT_OTHER): Payer: Self-pay

## 2014-11-06 ENCOUNTER — Emergency Department (HOSPITAL_BASED_OUTPATIENT_CLINIC_OR_DEPARTMENT_OTHER)
Admission: EM | Admit: 2014-11-06 | Discharge: 2014-11-06 | Disposition: A | Discharge status: Home / Self Care | Payer: Self-pay | Attending: Emergency Medicine | Admitting: Emergency Medicine

## 2014-11-06 ENCOUNTER — Encounter (HOSPITAL_BASED_OUTPATIENT_CLINIC_OR_DEPARTMENT_OTHER): Payer: Self-pay | Admitting: *Deleted

## 2014-11-06 DIAGNOSIS — R011 Cardiac murmur, unspecified: Secondary | ICD-10-CM | POA: Insufficient documentation

## 2014-11-06 DIAGNOSIS — Z86718 Personal history of other venous thrombosis and embolism: Secondary | ICD-10-CM | POA: Insufficient documentation

## 2014-11-06 DIAGNOSIS — I825Y1 Chronic embolism and thrombosis of unspecified deep veins of right proximal lower extremity: Secondary | ICD-10-CM

## 2014-11-06 DIAGNOSIS — Z720 Tobacco use: Secondary | ICD-10-CM | POA: Insufficient documentation

## 2014-11-06 DIAGNOSIS — Z7901 Long term (current) use of anticoagulants: Secondary | ICD-10-CM | POA: Insufficient documentation

## 2014-11-06 DIAGNOSIS — Z79899 Other long term (current) drug therapy: Secondary | ICD-10-CM | POA: Insufficient documentation

## 2014-11-06 DIAGNOSIS — Z8611 Personal history of tuberculosis: Secondary | ICD-10-CM | POA: Insufficient documentation

## 2014-11-06 DIAGNOSIS — M79604 Pain in right leg: Secondary | ICD-10-CM

## 2014-11-06 DIAGNOSIS — J45909 Unspecified asthma, uncomplicated: Secondary | ICD-10-CM | POA: Insufficient documentation

## 2014-11-06 DIAGNOSIS — Z872 Personal history of diseases of the skin and subcutaneous tissue: Secondary | ICD-10-CM | POA: Insufficient documentation

## 2014-11-06 DIAGNOSIS — Z8719 Personal history of other diseases of the digestive system: Secondary | ICD-10-CM | POA: Insufficient documentation

## 2014-11-06 DIAGNOSIS — M79661 Pain in right lower leg: Secondary | ICD-10-CM | POA: Insufficient documentation

## 2014-11-06 DIAGNOSIS — F319 Bipolar disorder, unspecified: Secondary | ICD-10-CM | POA: Insufficient documentation

## 2014-11-06 DIAGNOSIS — Z86711 Personal history of pulmonary embolism: Secondary | ICD-10-CM | POA: Insufficient documentation

## 2014-11-06 DIAGNOSIS — Z7952 Long term (current) use of systemic steroids: Secondary | ICD-10-CM | POA: Insufficient documentation

## 2014-11-06 LAB — PROTIME-INR
INR: 2.52 — AB (ref 0.00–1.49)
Prothrombin Time: 27.2 seconds — ABNORMAL HIGH (ref 11.6–15.2)

## 2014-11-06 MED ORDER — TRAMADOL HCL 50 MG PO TABS
50.0000 mg | ORAL_TABLET | Freq: Four times a day (QID) | ORAL | Status: DC | PRN
Start: 2014-11-06 — End: 2014-11-09

## 2014-11-06 MED ORDER — TRAMADOL HCL 50 MG PO TABS
50.0000 mg | ORAL_TABLET | Freq: Once | ORAL | Status: AC
  Administered 2014-11-06: 50 mg via ORAL
  Filled 2014-11-06: qty 1

## 2014-11-06 NOTE — ED Notes (Signed)
Pt reports that he is having leg problems from an allergic reaction to xarelto.  Pt noted to have bilateral leg swelling, blistering and weeping.  Last dose of xarelto was in march.

## 2014-11-06 NOTE — Discharge Instructions (Signed)
As discussed, your evaluation today has been largely reassuring.  But, it is important that you monitor your condition carefully, and do not hesitate to return to the ED if you develop new, or concerning changes in your condition. ? ?Otherwise, please follow-up with your physician for appropriate ongoing care. ? ?
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
## 2014-11-07 ENCOUNTER — Inpatient Hospital Stay (HOSPITAL_COMMUNITY)
Admission: EM | Admit: 2014-11-07 | Discharge: 2014-11-09 | DRG: 603 | Disposition: A | Discharge status: Home / Self Care | Payer: Self-pay | Attending: Internal Medicine | Admitting: Internal Medicine

## 2014-11-07 ENCOUNTER — Encounter (HOSPITAL_COMMUNITY): Payer: Self-pay | Admitting: Emergency Medicine

## 2014-11-07 DIAGNOSIS — F1010 Alcohol abuse, uncomplicated: Secondary | ICD-10-CM | POA: Diagnosis present

## 2014-11-07 DIAGNOSIS — L209 Atopic dermatitis, unspecified: Secondary | ICD-10-CM | POA: Diagnosis present

## 2014-11-07 DIAGNOSIS — Z7901 Long term (current) use of anticoagulants: Secondary | ICD-10-CM

## 2014-11-07 DIAGNOSIS — L03115 Cellulitis of right lower limb: Principal | ICD-10-CM | POA: Diagnosis present

## 2014-11-07 DIAGNOSIS — Z720 Tobacco use: Secondary | ICD-10-CM

## 2014-11-07 DIAGNOSIS — J45901 Unspecified asthma with (acute) exacerbation: Secondary | ICD-10-CM | POA: Diagnosis present

## 2014-11-07 DIAGNOSIS — Z79899 Other long term (current) drug therapy: Secondary | ICD-10-CM

## 2014-11-07 DIAGNOSIS — M13862 Other specified arthritis, left knee: Secondary | ICD-10-CM | POA: Diagnosis present

## 2014-11-07 DIAGNOSIS — Z888 Allergy status to other drugs, medicaments and biological substances status: Secondary | ICD-10-CM

## 2014-11-07 DIAGNOSIS — R011 Cardiac murmur, unspecified: Secondary | ICD-10-CM | POA: Diagnosis present

## 2014-11-07 DIAGNOSIS — Z86711 Personal history of pulmonary embolism: Secondary | ICD-10-CM

## 2014-11-07 DIAGNOSIS — J45909 Unspecified asthma, uncomplicated: Secondary | ICD-10-CM | POA: Diagnosis present

## 2014-11-07 DIAGNOSIS — L03119 Cellulitis of unspecified part of limb: Secondary | ICD-10-CM

## 2014-11-07 DIAGNOSIS — L02415 Cutaneous abscess of right lower limb: Secondary | ICD-10-CM | POA: Diagnosis present

## 2014-11-07 DIAGNOSIS — K219 Gastro-esophageal reflux disease without esophagitis: Secondary | ICD-10-CM | POA: Diagnosis present

## 2014-11-07 DIAGNOSIS — M13861 Other specified arthritis, right knee: Secondary | ICD-10-CM | POA: Diagnosis present

## 2014-11-07 DIAGNOSIS — L819 Disorder of pigmentation, unspecified: Secondary | ICD-10-CM | POA: Diagnosis present

## 2014-11-07 DIAGNOSIS — Z86718 Personal history of other venous thrombosis and embolism: Secondary | ICD-10-CM

## 2014-11-07 DIAGNOSIS — R600 Localized edema: Secondary | ICD-10-CM | POA: Diagnosis present

## 2014-11-07 DIAGNOSIS — F1490 Cocaine use, unspecified, uncomplicated: Secondary | ICD-10-CM | POA: Diagnosis present

## 2014-11-07 DIAGNOSIS — L02419 Cutaneous abscess of limb, unspecified: Secondary | ICD-10-CM

## 2014-11-07 DIAGNOSIS — Z881 Allergy status to other antibiotic agents status: Secondary | ICD-10-CM

## 2014-11-07 DIAGNOSIS — F319 Bipolar disorder, unspecified: Secondary | ICD-10-CM | POA: Diagnosis present

## 2014-11-07 DIAGNOSIS — F17200 Nicotine dependence, unspecified, uncomplicated: Secondary | ICD-10-CM | POA: Diagnosis present

## 2014-11-07 DIAGNOSIS — F17210 Nicotine dependence, cigarettes, uncomplicated: Secondary | ICD-10-CM | POA: Diagnosis present

## 2014-11-07 HISTORY — DX: Dermatitis, unspecified: L30.9

## 2014-11-07 LAB — BRAIN NATRIURETIC PEPTIDE: B Natriuretic Peptide: 28.7 pg/mL (ref 0.0–100.0)

## 2014-11-07 LAB — COMPREHENSIVE METABOLIC PANEL
ALT: 30 U/L (ref 0–53)
ANION GAP: 7 (ref 5–15)
AST: 20 U/L (ref 0–37)
Albumin: 3.7 g/dL (ref 3.5–5.2)
Alkaline Phosphatase: 111 U/L (ref 39–117)
BILIRUBIN TOTAL: 0.3 mg/dL (ref 0.3–1.2)
BUN: 12 mg/dL (ref 6–23)
CO2: 30 mmol/L (ref 19–32)
CREATININE: 1.3 mg/dL (ref 0.50–1.35)
Calcium: 9.3 mg/dL (ref 8.4–10.5)
Chloride: 102 mmol/L (ref 96–112)
GFR calc Af Amer: 74 mL/min — ABNORMAL LOW (ref >90)
GFR calc non Af Amer: 64 mL/min — ABNORMAL LOW (ref >90)
Glucose, Bld: 91 mg/dL (ref 70–99)
Potassium: 4.5 mmol/L (ref 3.5–5.1)
Sodium: 139 mmol/L (ref 135–145)
TOTAL PROTEIN: 6.6 g/dL (ref 6.0–8.3)

## 2014-11-07 LAB — CBC WITH DIFFERENTIAL/PLATELET
Basophils Absolute: 0 10*3/uL (ref 0.0–0.1)
Basophils Relative: 1 % (ref 0–1)
Eosinophils Absolute: 0.2 10*3/uL (ref 0.0–0.7)
Eosinophils Relative: 3 % (ref 0–5)
HCT: 43.8 % (ref 39.0–52.0)
Hemoglobin: 14.5 g/dL (ref 13.0–17.0)
Lymphocytes Relative: 23 % (ref 12–46)
Lymphs Abs: 1.2 10*3/uL (ref 0.7–4.0)
MCH: 31.3 pg (ref 26.0–34.0)
MCHC: 33.1 g/dL (ref 30.0–36.0)
MCV: 94.4 fL (ref 78.0–100.0)
Monocytes Absolute: 0.6 10*3/uL (ref 0.1–1.0)
Monocytes Relative: 12 % (ref 3–12)
Neutro Abs: 3.4 10*3/uL (ref 1.7–7.7)
Neutrophils Relative %: 61 % (ref 43–77)
Platelets: 231 10*3/uL (ref 150–400)
RBC: 4.64 MIL/uL (ref 4.22–5.81)
RDW: 14.3 % (ref 11.5–15.5)
WBC: 5.4 10*3/uL (ref 4.0–10.5)

## 2014-11-07 LAB — I-STAT CG4 LACTIC ACID, ED
Lactic Acid, Venous: 0.97 mmol/L (ref 0.5–2.0)
Lactic Acid, Venous: 1.31 mmol/L (ref 0.5–2.0)

## 2014-11-07 MED ORDER — WARFARIN SODIUM 10 MG PO TABS
10.0000 mg | ORAL_TABLET | ORAL | Status: DC
  Administered 2014-11-07 – 2014-11-08 (×2): 10 mg via ORAL
  Filled 2014-11-07 (×2): qty 1

## 2014-11-07 MED ORDER — WARFARIN SODIUM 2.5 MG PO TABS
12.5000 mg | ORAL_TABLET | ORAL | Status: DC
  Filled 2014-11-07: qty 1

## 2014-11-07 MED ORDER — VANCOMYCIN HCL IN DEXTROSE 1-5 GM/200ML-% IV SOLN: 1000.0000 mg | Freq: Once | INTRAVENOUS | Status: DC

## 2014-11-07 MED ORDER — ALBUTEROL SULFATE (2.5 MG/3ML) 0.083% IN NEBU: 2.5000 mg | INHALATION_SOLUTION | RESPIRATORY_TRACT | Status: DC | PRN

## 2014-11-07 MED ORDER — ACETAMINOPHEN 325 MG PO TABS
650.0000 mg | ORAL_TABLET | Freq: Once | ORAL | Status: AC
  Administered 2014-11-07: 650 mg via ORAL
  Filled 2014-11-07: qty 2

## 2014-11-07 MED ORDER — TRAZODONE HCL 100 MG PO TABS
100.0000 mg | ORAL_TABLET | Freq: Every evening | ORAL | Status: DC | PRN
  Administered 2014-11-09: 100 mg via ORAL
  Filled 2014-11-07 (×3): qty 1

## 2014-11-07 MED ORDER — ALUM & MAG HYDROXIDE-SIMETH 200-200-20 MG/5ML PO SUSP: 30.0000 mL | Freq: Four times a day (QID) | ORAL | Status: DC | PRN

## 2014-11-07 MED ORDER — OXYCODONE HCL 5 MG PO TABS: 5.0000 mg | ORAL_TABLET | ORAL | Status: DC | PRN

## 2014-11-07 MED ORDER — HYDROMORPHONE HCL 1 MG/ML IJ SOLN: 0.5000 mg | INTRAMUSCULAR | Status: DC | PRN

## 2014-11-07 MED ORDER — WARFARIN - PHYSICIAN DOSING INPATIENT: Freq: Every day | Status: DC

## 2014-11-07 MED ORDER — ACETAMINOPHEN 650 MG RE SUPP: 650.0000 mg | Freq: Four times a day (QID) | RECTAL | Status: DC | PRN

## 2014-11-07 MED ORDER — WARFARIN SODIUM 10 MG PO TABS: 10.0000 mg | ORAL_TABLET | Freq: Every day | ORAL | Status: DC

## 2014-11-07 MED ORDER — ACETAMINOPHEN 325 MG PO TABS
650.0000 mg | ORAL_TABLET | Freq: Four times a day (QID) | ORAL | Status: DC | PRN
  Administered 2014-11-09: 650 mg via ORAL
  Filled 2014-11-07: qty 2

## 2014-11-07 MED ORDER — ONDANSETRON HCL 4 MG PO TABS: 4.0000 mg | ORAL_TABLET | Freq: Four times a day (QID) | ORAL | Status: DC | PRN

## 2014-11-07 MED ORDER — VANCOMYCIN HCL IN DEXTROSE 750-5 MG/150ML-% IV SOLN
750.0000 mg | Freq: Two times a day (BID) | INTRAVENOUS | Status: DC
  Administered 2014-11-08 – 2014-11-09 (×3): 750 mg via INTRAVENOUS
  Filled 2014-11-07 (×5): qty 150

## 2014-11-07 MED ORDER — ONDANSETRON HCL 4 MG/2ML IJ SOLN: 4.0000 mg | Freq: Four times a day (QID) | INTRAMUSCULAR | Status: DC | PRN

## 2014-11-07 MED ORDER — DOXEPIN HCL 25 MG PO CAPS
25.0000 mg | ORAL_CAPSULE | Freq: Every evening | ORAL | Status: DC | PRN
  Administered 2014-11-09: 25 mg via ORAL
  Filled 2014-11-07 (×3): qty 1

## 2014-11-07 MED ORDER — GABAPENTIN 100 MG PO CAPS
200.0000 mg | ORAL_CAPSULE | Freq: Three times a day (TID) | ORAL | Status: DC
  Administered 2014-11-07 – 2014-11-09 (×6): 200 mg via ORAL
  Filled 2014-11-07 (×7): qty 2

## 2014-11-07 MED ORDER — VANCOMYCIN HCL IN DEXTROSE 1-5 GM/200ML-% IV SOLN
1000.0000 mg | Freq: Once | INTRAVENOUS | Status: AC
  Administered 2014-11-07: 1000 mg via INTRAVENOUS
  Filled 2014-11-07: qty 200

## 2014-11-07 MED ORDER — DULOXETINE HCL 60 MG PO CPEP
60.0000 mg | ORAL_CAPSULE | Freq: Every day | ORAL | Status: DC
  Administered 2014-11-08 – 2014-11-09 (×2): 60 mg via ORAL
  Filled 2014-11-07 (×2): qty 1

## 2014-11-07 MED ORDER — SODIUM CHLORIDE 0.9 % IV SOLN
INTRAVENOUS | Status: DC
  Administered 2014-11-07: via INTRAVENOUS

## 2014-11-07 MED ORDER — PIPERACILLIN-TAZOBACTAM 3.375 G IVPB 30 MIN
3.3750 g | Freq: Once | INTRAVENOUS | Status: AC
  Administered 2014-11-07: 3.375 g via INTRAVENOUS
  Filled 2014-11-07: qty 50

## 2014-11-07 MED ORDER — PIPERACILLIN-TAZOBACTAM 3.375 G IVPB
3.3750 g | Freq: Three times a day (TID) | INTRAVENOUS | Status: DC
  Administered 2014-11-08 – 2014-11-09 (×5): 3.375 g via INTRAVENOUS
  Filled 2014-11-07 (×6): qty 50

## 2014-11-07 MED ORDER — PIPERACILLIN-TAZOBACTAM 3.375 G IVPB 30 MIN: 3.3750 g | Freq: Once | INTRAVENOUS | Status: DC

## 2014-11-07 MED ORDER — HYDROXYZINE HCL 25 MG PO TABS
25.0000 mg | ORAL_TABLET | Freq: Three times a day (TID) | ORAL | Status: DC | PRN
  Administered 2014-11-07 – 2014-11-09 (×3): 25 mg via ORAL
  Filled 2014-11-07 (×3): qty 1

## 2014-11-07 MED ORDER — SODIUM CHLORIDE 0.9 % IV BOLUS (SEPSIS): 1000.0000 mL | Freq: Once | INTRAVENOUS | Status: DC
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
## 2014-11-07 NOTE — ED Notes (Signed)
Dr. Jacubowitz at bedside 
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
## 2014-11-07 NOTE — Progress Notes (Signed)
ANTIBIOTIC CONSULT NOTE - INITIAL  Pharmacy Consult for vanc/zosyn Indication: cellulitis  Allergies  Allergen Reactions  . Xarelto [Rivaroxaban] Dermatitis    Blisters skin  . Clindamycin/Lincomycin Dermatitis    Severe rash/dermatitis from November 2015 still present Jan 2016 from clinda    Patient Measurements: Height: 6\' 3"  (190.5 cm) Weight: 247 lb 12.8 oz (112.401 kg) IBW/kg (Calculated) : 84.5  Vital Signs: Temp: 97.7 F (36.5 C) (04/30 2115) Temp Source: Oral (04/30 2115) BP: 120/75 mmHg (04/30 2115) Pulse Rate: 68 (04/30 2115) Intake/Output from previous day:   Intake/Output from this shift: Total I/O In: -  Out: 330 [Urine:330]  Labs:  Recent Labs  11/07/14 1747  WBC 5.4  HGB 14.5  PLT 231  CREATININE 1.30   Estimated Creatinine Clearance: 94.1 mL/min (by C-G formula based on Cr of 1.3). No results for input(s): VANCOTROUGH, VANCOPEAK, VANCORANDOM, GENTTROUGH, GENTPEAK, GENTRANDOM, TOBRATROUGH, TOBRAPEAK, TOBRARND, AMIKACINPEAK, AMIKACINTROU, AMIKACIN in the last 72 hours.   Microbiology: No results found for this or any previous visit (from the past 720 hour(s)).  Medical History: Past Medical History  Diagnosis Date  . DVT (deep venous thrombosis)     on Coumadin Rx  . Substance abuse     crack, cocaine, last use 2007  . Tobacco abuse   . DVT (deep venous thrombosis)   . Bronchitis     last time 2 yrs ago  . Heart murmur   . Shortness of breath   . Tuberculosis     ' I test Positive "  . Asthma     uses Albuterol daily as needed  . Cough     smokers  . Arthritis     knees  . Joint pain   . GERD (gastroesophageal reflux disease)     only takes something about 2 times a yr  . Cellulitis   . Bipolar 1 disorder   . Alcohol abuse   . Pulmonary embolism 2014  . Chronic dermatitis     due to Xarelto Rx   Assessment: 70 yom admitted with recurrence of painful red legs, progressively worsening for 1 mo. Hx recurrent hospitalizations  for cellulitis of LEs. Seen by [facility] 4/29 - DVT ruled out. Pharmacy consulted to dose vanc/zosyn for cellulitis. No cultures. Afeb, wbc wnl. SCr 1.3, CrCl~70.  4/30 vanc>> 4/30 zosyn>>  Goal of Therapy:  Vancomycin trough level 10-15 mcg/ml  Plan:  Vanc IV 1g given x 1 in ED; then Vanc 750mg  IV q12h Zosyn 3.375mg  IV q8h F/u clinical progress, renal function, abx plan  Elicia Lamp, PharmD Clinical Pharmacist - Resident Pager [phone] 11/07/2014 9:47 PM

## 2014-11-07 NOTE — ED Notes (Signed)
Pt states that he would like some tylenol for pain - PA notified

## 2014-11-07 NOTE — ED Notes (Signed)
Pt c/o pain to right leg ongoing x 6 months. Pt has bandages to right leg, reports that it was weeping.

## 2014-11-07 NOTE — ED Provider Notes (Signed)
Patient with recurrence of painful red legs laterally which she says is progressively worsening for past one month. Patient has had recurrent hospitalizations for cellulitis of lower extremities Seen by at [facility] yesterday, DVT ruled out. Presents today as his left leg has become more swollen and painful. On exam patient in no distress alert nontoxic. Bilateral lower extremities diffusely reddened warm and tender and edematous. DP pulses 2+ bilaterally.   Orlie Dakin, MD 11/07/14 [phone]

## 2014-11-08 DIAGNOSIS — L309 Dermatitis, unspecified: Secondary | ICD-10-CM

## 2014-11-08 LAB — BASIC METABOLIC PANEL
ANION GAP: 10 (ref 5–15)
BUN: 9 mg/dL (ref 6–20)
CHLORIDE: 102 mmol/L (ref 101–111)
CO2: 25 mmol/L (ref 22–32)
Calcium: 8.9 mg/dL (ref 8.9–10.3)
Creatinine, Ser: 1.08 mg/dL (ref 0.61–1.24)
GFR calc Af Amer: >60 mL/min (ref >60)
GFR calc non Af Amer: >60 mL/min (ref >60)
Glucose, Bld: 72 mg/dL (ref 70–99)
POTASSIUM: 4 mmol/L (ref 3.5–5.1)
Sodium: 137 mmol/L (ref 135–145)

## 2014-11-08 LAB — CBC
HCT: 45.2 % (ref 39.0–52.0)
Hemoglobin: 15 g/dL (ref 13.0–17.0)
MCH: 31 pg (ref 26.0–34.0)
MCHC: 33.2 g/dL (ref 30.0–36.0)
MCV: 93.4 fL (ref 78.0–100.0)
Platelets: 207 10*3/uL (ref 150–400)
RBC: 4.84 MIL/uL (ref 4.22–5.81)
RDW: 14.1 % (ref 11.5–15.5)
WBC: 5.5 10*3/uL (ref 4.0–10.5)

## 2014-11-08 LAB — PROTIME-INR
INR: 2.09 — ABNORMAL HIGH (ref 0.00–1.49)
PROTHROMBIN TIME: 23.7 s — AB (ref 11.6–15.2)

## 2014-11-08 LAB — MRSA PCR SCREENING: MRSA by PCR: POSITIVE — AB

## 2014-11-08 MED ORDER — CHLORHEXIDINE GLUCONATE CLOTH 2 % EX PADS
6.0000 | MEDICATED_PAD | Freq: Every day | CUTANEOUS | Status: DC
  Administered 2014-11-08 – 2014-11-09 (×2): 6 via TOPICAL

## 2014-11-08 MED ORDER — FUROSEMIDE 10 MG/ML IJ SOLN
20.0000 mg | Freq: Once | INTRAMUSCULAR | Status: AC
  Administered 2014-11-08: 20 mg via INTRAVENOUS
  Filled 2014-11-08: qty 2

## 2014-11-08 MED ORDER — CLOBETASOL PROPIONATE 0.05 % EX OINT
TOPICAL_OINTMENT | Freq: Two times a day (BID) | CUTANEOUS | Status: DC
  Administered 2014-11-08: 11:00:00 via TOPICAL
  Filled 2014-11-08: qty 15

## 2014-11-08 MED ORDER — CLOBETASOL PROPIONATE 0.05 % EX OINT
TOPICAL_OINTMENT | Freq: Two times a day (BID) | CUTANEOUS | Status: DC
  Administered 2014-11-08 – 2014-11-09 (×3): via TOPICAL
  Filled 2014-11-08: qty 60

## 2014-11-08 MED ORDER — OXYCODONE HCL 5 MG PO TABS: 5.0000 mg | ORAL_TABLET | ORAL | Status: DC | PRN

## 2014-11-08 MED ORDER — MUPIROCIN 2 % EX OINT
1.0000 "application " | TOPICAL_OINTMENT | Freq: Two times a day (BID) | CUTANEOUS | Status: DC
  Administered 2014-11-08 – 2014-11-09 (×3): 1 via NASAL
  Filled 2014-11-08: qty 22

## 2014-11-08 NOTE — Progress Notes (Signed)
ANTICOAGULATION CONSULT NOTE - Follow Up Consult  Pharmacy Consult for Coumadin Indication: DVT  Allergies  Allergen Reactions  . Xarelto [Rivaroxaban] Dermatitis    Blisters skin  . Clindamycin/Lincomycin Dermatitis    Severe rash/dermatitis from November 2015 still present Jan 2016 from clinda   Labs:  Recent Labs  11/05/14 1139 11/06/14 1425 11/07/14 1747 11/08/14 0653  HGB  --   --  14.5 15.0  HCT  --   --  43.8 45.2  PLT  --   --  231 207  LABPROT  --  27.2*  --  23.7*  INR 4.1 2.52*  --  2.09*  CREATININE  --   --  1.30 1.08    Estimated Creatinine Clearance: 113.1 mL/min (by C-G formula based on Cr of 1.08).  Assessment: 49 year old male on Coumadin prior to admission for DVT history INR on admission = 2.52, now 2.09 Dose PTA = 12.5 mg Monday and Friday, 10 mg other days  Goal of Therapy:  INR 2-3 Monitor platelets by anticoagulation protocol: Yes   Plan:  Continue home dose for now Daily INR  Thank you. Anette Guarneri, PharmD [phone]  11/08/2014,10:56 AM

## 2014-11-08 NOTE — Progress Notes (Signed)
PROGRESS NOTE  [name] [name] SWN:[id number] DOB: 03/15/1966 DOA: 11/07/2014 PCP: No PCP Per Patient  Assessment/Plan: Right leg swelling- has known h/o DVT in this leg, will need TED hose -being treated for cellulitis- no fever, no WBC- but does have reactive lymph nodes, chronic skin changes- suspect can change to PO abx in 1-2 days - give IV lasix x 1- weight up about 35 lbs since last admission -continue coumadin -elevate leg  Poly substance abuse -in [place]  Severe eczema improve since last hospitalization, has not beeing using temovate due to cost- resume    Code Status: full Family Communication: patient Disposition Plan:    Consultants:    Procedures:     HPI/Subjective: No SOB, no CP  Objective: Filed Vitals:   11/08/14 0701  BP: 107/61  Pulse: 85  Temp: 97.8 F (36.6 C)  Resp: 16    Intake/Output Summary (Last 24 hours) at 11/08/14 0930 Last data filed at 11/08/14 0300  Gross per 24 hour  Intake    120 ml  Output    830 ml  Net   -710 ml   Filed Weights   11/07/14 1306 11/07/14 2110 11/08/14 0718  Weight: 116.121 kg (256 lb) 112.401 kg (247 lb 12.8 oz) 112.311 kg (247 lb 9.6 oz)    Exam:   General:  Much more pleasant then previous hospitalization  Cardiovascular: rrr  Respiratory: clear  Abdomen: +BS, soft  Musculoskeletal: left leg with chronic skin changes, right leg swollen and warm- eczematous changes   Data Reviewed: Basic Metabolic Panel:  Recent Labs Lab 11/07/14 1747 11/08/14 0653  NA 139 137  K 4.5 4.0  CL 102 102  CO2 30 25  GLUCOSE 91 72  BUN 12 9  CREATININE 1.30 1.08  CALCIUM 9.3 8.9   Liver Function Tests:  Recent Labs Lab 11/07/14 1747  AST 20  ALT 30  ALKPHOS 111  BILITOT 0.3  PROT 6.6  ALBUMIN 3.7   No results for input(s): LIPASE, AMYLASE in the last 168 hours. No results for input(s): AMMONIA in the last 168 hours. CBC:  Recent Labs Lab 11/07/14 1747 11/08/14 0653  WBC 5.4 5.5    NEUTROABS 3.4  --   HGB 14.5 15.0  HCT 43.8 45.2  MCV 94.4 93.4  PLT 231 207   Cardiac Enzymes: No results for input(s): CKTOTAL, CKMB, CKMBINDEX, TROPONINI in the last 168 hours. BNP (last 3 results)  Recent Labs  11/07/14 1747  BNP 28.7    ProBNP (last 3 results) No results for input(s): PROBNP in the last 8760 hours.  CBG: No results for input(s): GLUCAP in the last 168 hours.  Recent Results (from the past 240 hour(s))  MRSA PCR Screening     Status: Abnormal   Collection Time: 11/07/14 10:20 PM  Result Value Ref Range Status   MRSA by PCR POSITIVE (A) NEGATIVE Final    Comment:        The GeneXpert MRSA Assay (FDA approved for NASAL specimens only), is one component of a comprehensive MRSA colonization surveillance program. It is not intended to diagnose MRSA infection nor to guide or monitor treatment for MRSA infections. RESULT CALLED TO, READ BACK BY AND VERIFIED WITH: JETER,A RN 0121 11/08/14 MITCHELL,L      Studies: US Venous Img Lower Unilateral Right  11/06/2014   ADDENDUM REPORT: 11/06/2014 16:03  ADDENDUM: Study discussed by telephone with Dr. Carmin Muskrat on 11/06/2014 at 1557 hours.  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 Therefore, no acute DVT is suspected today.   Electronically Signed   By: Genevie Ann M.D.   On: 11/06/2014 16:03   11/06/2014   CLINICAL DATA:  49 year old male with chronic right lower extremity swelling for 6 months. Chronic DVT, switched from xaralto to Coumadin. Subsequent encounter.  EXAM: RIGHT LOWER EXTREMITY VENOUS DOPPLER ULTRASOUND  TECHNIQUE: Gray-scale sonography with graded compression, as well as color Doppler and duplex ultrasound were performed to evaluate the lower extremity deep venous systems from the level of the common femoral vein and including the common femoral, femoral, profunda femoral, popliteal and calf veins  including the posterior tibial, peroneal and gastrocnemius veins when visible. The superficial great saphenous vein was also interrogated. Spectral Doppler was utilized to evaluate flow at rest and with distal augmentation maneuvers in the common femoral, femoral and popliteal veins.  COMPARISON:  Right lower extremity MRI 07/23/2014.  FINDINGS: Contralateral Common Femoral Vein: Respiratory phasicity is normal and symmetric with the symptomatic side. No evidence of thrombus. Normal compressibility.  Common Femoral Vein: No evidence of thrombus. Normal compressibility, respiratory phasicity and response to augmentation.  Saphenofemoral Junction: No evidence of thrombus. Normal compressibility and flow on color Doppler imaging.  Profunda Femoral Vein: No evidence of thrombus. Normal compressibility and flow on color Doppler imaging.  Femoral Vein: Partial or majority thrombosis with abnormal echogenic material (image 23). The vessel is partially compressible.  Popliteal Vein: Partial thrombosis with echogenic material. Better preserved flow on color Doppler than in the femoral vein.  Calf Veins: No evidence of thrombus. Normal compressibility and flow on color Doppler imaging.  Superficial Great Saphenous Vein: No evidence of thrombus. Normal compressibility and flow on color Doppler imaging.  Venous Reflux:  None.  Other Findings: Prominent right groin lymph nodes (image 16), but with preserved normal fatty hila. Extensive subcutaneous edema in the calf and ankle.  IMPRESSION: 1. Partial thrombosis of both the right femoral vein and popliteal vein. Favor chronic DVT in this setting. 2. No other deep or superficial venous thrombosis identified in the right lower extremity. 3. Extensive subcutaneous edema in the calf and ankle. 4. Reactive appearing right groin lymph nodes.  Electronically Signed: By: Genevie Ann M.D. On: 11/06/2014 15:52    Scheduled Meds: . Chlorhexidine Gluconate Cloth  6 each Topical Q0600  .  DULoxetine  60 mg Oral Daily  . gabapentin  200 mg Oral TID  . mupirocin ointment  1 application Nasal BID  . piperacillin-tazobactam (ZOSYN)  IV  3.375 g Intravenous 3 times per day  . vancomycin  750 mg Intravenous Q12H  . [START ON 11/09/2014] warfarin  12.5 mg Oral Once per day on Mon Fri   And  . warfarin  10 mg Oral Once per day on Sun Tue Wed Thu Sat  . Warfarin - Physician Dosing Inpatient   Does not apply q1800   Continuous Infusions: . sodium chloride 50 mL/hr at 11/07/14 2339   Antibiotics Given (last 72 hours)    Date/Time Action Medication Dose Rate   11/08/14 0349 Given   piperacillin-tazobactam (ZOSYN) IVPB 3.375 g 3.375 g 12.5 mL/hr   11/08/14 0749 Given   vancomycin (VANCOCIN) IVPB 750 mg/150 ml premix 750 mg 150 mL/hr      Active Problems:   Long-term (current) use of anticoagulants   Tobacco abuse   Cellulitis of right lower leg   Cellulitis and abscess  of leg   Bilateral lower extremity edema    Time spent: 25 min    Jozette Castrellon  [facility] Pager [phone]. If 7PM-7AM, please contact night-coverage at [email or web address], password [email or web address] 11/08/2014, 9:30 AM  LOS: 1 day

## 2014-11-09 LAB — PROTIME-INR
INR: 2.26 — ABNORMAL HIGH (ref 0.00–1.49)
Prothrombin Time: 25.2 seconds — ABNORMAL HIGH (ref 11.6–15.2)

## 2014-11-09 MED ORDER — POTASSIUM CHLORIDE ER 10 MEQ PO TBCR: 10.0000 meq | EXTENDED_RELEASE_TABLET | Freq: Every day | ORAL | Status: DC

## 2014-11-09 MED ORDER — DOXYCYCLINE HYCLATE 100 MG PO CAPS: 100.0000 mg | ORAL_CAPSULE | Freq: Two times a day (BID) | ORAL | Status: DC

## 2014-11-09 MED ORDER — FUROSEMIDE 40 MG PO TABS: 40.0000 mg | ORAL_TABLET | Freq: Every day | ORAL | Status: DC

## 2014-11-09 NOTE — Discharge Summary (Signed)
Physician Discharge Summary  [name] [name] YJE:[id number] DOB: 1965/09/06 DOA: 11/07/2014  PCP: No PCP Per Patient  Admit date: 11/07/2014 Discharge date: 11/09/2014  Time spent: greater than 30 minutes  Recommendations for Outpatient Follow-up:  1. Patient reportedly has follow-up appointment with dermatology which I recommend he keep.  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 male with a history of atopic dermatitis DVT/PE on Coumadin who presents to the ED with complaints of redness and swelling and pain in both legs worsening over the past 3 days. He was seen in the ED 1 day ago and was evaluated in the ED for an Acute DVT which was ruled out. He reports worsening and returned to the ED today and was diagnosed with Cellulitis. He was place on IV Vancomycin and Zosyn.   Hospital Course:  Admitted to [facility]. Vancomycin and Zosyn continued. Patient reportedly has a history of atopic dermatitis and had stopped his steroid cream. May be contributing. Suspect superinfection. Coumadin was therapeutic on admission. Given the significant amount of swelling he was given a dose of Lasix with good results. I will continue Lasix and potassium for 3 more days. By discharge, he had no significant erythema and reports his swelling is improved. I will give him 5 more days of doxycycline. He tells me he is an appointment with [facility] dermatology coming up. He has hyperpigmentation on both legs which appears chronic. Stable for  discharge  Procedures:  None  Consultations:  None  Discharge Exam: Filed Vitals:   11/09/14 1337  BP: 101/63  Pulse: 73  Temp: 98 F (36.7 C)  Resp: 18    General: Alert, oriented. Cooperative and pleasant. Cardiovascular: Regular rate rhythm without murmurs gallops rubs Respiratory: Clear to auscultation bilaterally without wheezes rhonchi or rales Extremities: Right leg with more edema than left. No significant erythema or warmth. Significant hyperpigmentation bilaterally. Scaling particularly on the feet.  Discharge Instructions   Discharge Instructions    Activity as tolerated - No restrictions    Complete by:  As directed      Diet - low sodium heart healthy    Complete by:  As directed      Discharge instructions    Complete by:  As directed   Keep legs elevated while seated or in bed          Current Discharge Medication List    START taking these medications   Details  doxycycline (VIBRAMYCIN) 100 MG capsule Take 1 capsule (100 mg total) by mouth 2 (two) times daily. Qty: 10 capsule, Refills: 0    furosemide (LASIX) 40 MG tablet Take 1 tablet (40 mg total) by mouth daily. For 3 days Qty: 3 tablet, Refills: 0    potassium chloride (K-DUR) 10 MEQ tablet Take 1 tablet (10 mEq total) by mouth daily. For 3 days Qty: 3 tablet, Refills: 0      CONTINUE these medications which have NOT CHANGED   Details  acetaminophen (TYLENOL) 500 MG tablet Take 1,000 mg by mouth every 8 (eight) hours as  needed (pain).    albuterol (PROVENTIL HFA;VENTOLIN HFA) 108 (90 BASE) MCG/ACT inhaler Inhale 2 puffs into the lungs every 4 (four) hours as needed for wheezing or shortness of breath.     doxepin (SINEQUAN) 25 MG capsule Take 1 capsule (25 mg total) by mouth at bedtime as needed (sleep). Qty: 30 capsule, Refills: 0    DULoxetine (CYMBALTA) 60 MG capsule Take 1 capsule (60 mg total) by mouth daily. Qty: 30 capsule, Refills: 0    gabapentin (NEURONTIN) 100 MG capsule  Take 2 capsules (200 mg total) by mouth 3 (three) times daily. Qty: 180 capsule, Refills: 0    hydrOXYzine (ATARAX/VISTARIL) 25 MG tablet Take 1 tablet (25 mg total) by mouth 3 (three) times daily as needed for itching. Qty: 30 tablet, Refills: 0    mineral oil-hydrophilic petrolatum (AQUAPHOR) ointment Apply 1 application topically 3 (three) times daily as needed for dry skin or irritation.    Skin Protectants, Misc. (EUCERIN) cream Apply 1 application topically 2 (two) times daily as needed for dry skin (skin irritation).    traZODone (DESYREL) 100 MG tablet Take 1 tablet (100 mg total) by mouth at bedtime as needed for sleep. Qty: 14 tablet, Refills: 0    warfarin (COUMADIN) 5 MG tablet Take 10-12.5 mg by mouth daily at 6 PM. Take 2 1/2 tablets (12.5 mg) on Monday and Friday, take 2 tablets (10 mg) on Sunday, Tuesday, Wednesday, Thursday, Saturday    clobetasol cream (TEMOVATE) 8.29 % Apply 1 application topically 2 (two) times daily. Apply to legs/arms/hands twice daily as needed for itching Qty: 30 g, Refills: 0      STOP taking these medications     traMADol (ULTRAM) 50 MG tablet        Allergies  Allergen Reactions  . Xarelto [Rivaroxaban] Dermatitis    Blisters skin  . Clindamycin/Lincomycin Dermatitis    Severe rash/dermatitis from November 2015 still present Jan 2016 from clinda      The results of significant diagnostics from this hospitalization (including imaging, microbiology, ancillary and laboratory) are listed below for reference.    Significant Diagnostic Studies: US Venous Img Lower Unilateral Right  11/06/2014   ADDENDUM REPORT: 11/06/2014 16:03  ADDENDUM: Study discussed by telephone with Dr. Carmin Muskrat on 11/06/2014 at 1557 hours.  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 Therefore, no acute DVT is suspected today.   Electronically Signed   By: Genevie Ann  M.D.   On: 11/06/2014 16:03   11/06/2014   CLINICAL DATA:  49 year old male with chronic right lower extremity swelling for 6 months. Chronic DVT, switched from xaralto to Coumadin. Subsequent encounter.  EXAM: RIGHT LOWER EXTREMITY VENOUS DOPPLER ULTRASOUND  TECHNIQUE: Gray-scale sonography with graded compression, as well as color Doppler and duplex ultrasound were performed to evaluate the lower extremity deep venous systems from the level of the common femoral vein and including the common femoral, femoral, profunda femoral, popliteal and calf veins including the posterior tibial, peroneal and gastrocnemius veins when visible. The superficial great saphenous vein was also interrogated. Spectral Doppler was utilized to evaluate flow at rest and with distal augmentation maneuvers in the common femoral, femoral and popliteal veins.  COMPARISON:  Right lower extremity MRI 07/23/2014.  FINDINGS: Contralateral Common Femoral Vein: Respiratory phasicity is normal and symmetric with the symptomatic side. No evidence of thrombus. Normal compressibility.  Common Femoral Vein: No evidence  of thrombus. Normal compressibility, respiratory phasicity and response to augmentation.  Saphenofemoral Junction: No evidence of thrombus. Normal compressibility and flow on color Doppler imaging.  Profunda Femoral Vein: No evidence of thrombus. Normal compressibility and flow on color Doppler imaging.  Femoral Vein: Partial or majority thrombosis with abnormal echogenic material (image 23). The vessel is partially compressible.  Popliteal Vein: Partial thrombosis with echogenic material. Better preserved flow on color Doppler than in the femoral vein.  Calf Veins: No evidence of thrombus. Normal compressibility and flow on color Doppler imaging.  Superficial Great Saphenous Vein: No evidence of thrombus. Normal compressibility and flow on color Doppler imaging.  Venous Reflux:  None.  Other Findings: Prominent right groin lymph nodes  (image 16), but with preserved normal fatty hila. Extensive subcutaneous edema in the calf and ankle.  IMPRESSION: 1. Partial thrombosis of both the right femoral vein and popliteal vein. Favor chronic DVT in this setting. 2. No other deep or superficial venous thrombosis identified in the right lower extremity. 3. Extensive subcutaneous edema in the calf and ankle. 4. Reactive appearing right groin lymph nodes.  Electronically Signed: By: Genevie Ann M.D. On: 11/06/2014 15:52    Microbiology: Recent Results (from the past 240 hour(s))  MRSA PCR Screening     Status: Abnormal   Collection Time: 11/07/14 10:20 PM  Result Value Ref Range Status   MRSA by PCR POSITIVE (A) NEGATIVE Final    Comment:        The GeneXpert MRSA Assay (FDA approved for NASAL specimens only), is one component of a comprehensive MRSA colonization surveillance program. It is not intended to diagnose MRSA infection nor to guide or monitor treatment for MRSA infections. RESULT CALLED TO, READ BACK BY AND VERIFIED WITH: JETER,A RN 0121 11/08/14 MITCHELL,L      Labs: Basic Metabolic Panel:  Recent Labs Lab 11/07/14 1747 11/08/14 0653  NA 139 137  K 4.5 4.0  CL 102 102  CO2 30 25  GLUCOSE 91 72  BUN 12 9  CREATININE 1.30 1.08  CALCIUM 9.3 8.9   Liver Function Tests:  Recent Labs Lab 11/07/14 1747  AST 20  ALT 30  ALKPHOS 111  BILITOT 0.3  PROT 6.6  ALBUMIN 3.7   No results for input(s): LIPASE, AMYLASE in the last 168 hours. No results for input(s): AMMONIA in the last 168 hours. CBC:  Recent Labs Lab 11/07/14 1747 11/08/14 0653  WBC 5.4 5.5  NEUTROABS 3.4  --   HGB 14.5 15.0  HCT 43.8 45.2  MCV 94.4 93.4  PLT 231 207   Cardiac Enzymes: No results for input(s): CKTOTAL, CKMB, CKMBINDEX, TROPONINI in the last 168 hours. BNP: BNP (last 3 results)  Recent Labs  11/07/14 1747  BNP 28.7    ProBNP (last 3 results) No results for input(s): PROBNP in the last 8760 hours.  CBG: No  results for input(s): GLUCAP in the last 168 hours.   SignedDelfina Redwood  [facility] 11/09/2014, 2:18 PM

## 2014-11-09 NOTE — Clinical Social Work Note (Signed)
DC Summary faxed to [facility] per facility's request. Patient can now go back to facility. Facility states they will transport patient. CSW signing off.  Liz Beach MSW, [place], [place], [phone]

## 2014-11-09 NOTE — Progress Notes (Signed)
Patient discharge teaching given, including activity, diet, follow-up appoints, and medications. Patient verbalized understanding of all discharge instructions. IV access was d/c'd. Vitals are stable. Skin is intact except as charted in most recent assessments. Pt to be escorted out by RN, to be driven to facility by facility staff.

## 2014-11-09 NOTE — Care Management Note (Signed)
    Page 1 of 1   11/09/2014     3:35:17 PM CARE MANAGEMENT NOTE 11/09/2014  Patient:  [facility]   Account Number:  [id number]  Date Initiated:  11/09/2014  Documentation initiated by:  Tomi Bamberger  Subjective/Objective Assessment:   dx cellulitis  admit- lives with parent.     Action/Plan:   Anticipated DC Date:  11/09/2014   Anticipated DC Plan:  [place]  CM consult      Choice offered to / List presented to:             Status of service:  Completed, signed off Medicare Important Message given?  NO (If response is "NO", the following Medicare IM given date fields will be blank) Date Medicare IM given:   Medicare IM given by:   Date Additional Medicare IM given:   Additional Medicare IM given by:    Discharge Disposition:  HOME/SELF CARE  Per UR Regulation:  Reviewed for med. necessity/level of care/duration of stay  If discussed at [place] of Stay Meetings, dates discussed:    Comments:  11/09/14 [place] RN,BSN [phone] patient is for dc today, he will f/u with internal medicine clinic per patient.  He has a orange card.

## 2014-11-09 NOTE — Discharge Instructions (Signed)

## 2014-11-09 NOTE — Progress Notes (Signed)
ANTICOAGULATION CONSULT NOTE - Follow Up Consult  Pharmacy Consult for Coumadin3 Indication: h/o DVT  Allergies  Allergen Reactions  . Xarelto [Rivaroxaban] Dermatitis    Blisters skin  . Clindamycin/Lincomycin Dermatitis    Severe rash/dermatitis from November 2015 still present Jan 2016 from clinda    Patient Measurements: Height: 6\' 3"  (190.5 cm) Weight: 243 lb 2.7 oz (110.3 kg) IBW/kg (Calculated) : 84.5 Heparin Dosing Weight:    Vital Signs: Temp: 98.2 F (36.8 C) (05/02 0616) Temp Source: Oral (05/02 0616) BP: 109/60 mmHg (05/02 0616) Pulse Rate: 76 (05/02 0616)  Labs:  Recent Labs  11/06/14 1425 11/07/14 1747 11/08/14 0653 11/09/14 0544  HGB  --  14.5 15.0  --   HCT  --  43.8 45.2  --   PLT  --  231 207  --   LABPROT 27.2*  --  23.7* 25.2*  INR 2.52*  --  2.09* 2.26*  CREATININE  --  1.30 1.08  --     Estimated Creatinine Clearance: 112.2 mL/min (by C-G formula based on Cr of 1.08).   Assessment: 52 yom admitted w/ recurrence of painful red legs, progressively worsening for 1 mo. Hx recurrent hospitalizations for cellulitis of LEs. Seen by [facility] 4/29 - DVT ruled out  PMH: DVT/PE hx on warfarin, substance abuse, heart murmur, asthma, GERD, cellulitis, BP1  AC: Coumadin PTA. R leg swelling with known h/o DVT.. INR 2.52 on admit. CBC stable.INR 2.26 today.    Goal of Therapy:  INR 2-3 Monitor platelets by anticoagulation protocol: Yes   Plan:   Coumadin 10mg  TWThSS, 5mg  MonFri  Alford Highland, [employer] 11/09/2014,11:14 AM

## 2014-11-12 ENCOUNTER — Inpatient Hospital Stay: Payer: Self-pay | Admitting: Family Medicine

## 2014-11-18 ENCOUNTER — Telehealth: Payer: Self-pay | Admitting: Pharmacist

## 2014-11-18 NOTE — Telephone Encounter (Signed)
Call to patient to confirm appointment for 11/19/14 at 10:00 lmtcb

## 2014-11-19 ENCOUNTER — Encounter: Payer: Self-pay | Admitting: Internal Medicine

## 2014-11-19 ENCOUNTER — Encounter: Payer: Self-pay | Admitting: Licensed Clinical Social Worker

## 2014-11-19 ENCOUNTER — Ambulatory Visit (INDEPENDENT_AMBULATORY_CARE_PROVIDER_SITE_OTHER): Payer: Self-pay | Admitting: Internal Medicine

## 2014-11-19 ENCOUNTER — Telehealth: Payer: Self-pay | Admitting: *Deleted

## 2014-11-19 ENCOUNTER — Ambulatory Visit (INDEPENDENT_AMBULATORY_CARE_PROVIDER_SITE_OTHER): Payer: Self-pay | Admitting: Pharmacist

## 2014-11-19 VITALS — BP 107/80 | HR 96 | Temp 97.6°F | Ht 76.0 in | Wt 255.0 lb

## 2014-11-19 DIAGNOSIS — L03115 Cellulitis of right lower limb: Secondary | ICD-10-CM

## 2014-11-19 DIAGNOSIS — I87091 Postthrombotic syndrome with other complications of right lower extremity: Secondary | ICD-10-CM

## 2014-11-19 DIAGNOSIS — I87009 Postthrombotic syndrome without complications of unspecified extremity: Secondary | ICD-10-CM | POA: Insufficient documentation

## 2014-11-19 DIAGNOSIS — I2699 Other pulmonary embolism without acute cor pulmonale: Secondary | ICD-10-CM

## 2014-11-19 DIAGNOSIS — R21 Rash and other nonspecific skin eruption: Secondary | ICD-10-CM

## 2014-11-19 DIAGNOSIS — B9689 Other specified bacterial agents as the cause of diseases classified elsewhere: Secondary | ICD-10-CM

## 2014-11-19 DIAGNOSIS — I82509 Chronic embolism and thrombosis of unspecified deep veins of unspecified lower extremity: Secondary | ICD-10-CM

## 2014-11-19 DIAGNOSIS — Z7901 Long term (current) use of anticoagulants: Secondary | ICD-10-CM

## 2014-11-19 LAB — POCT INR: INR: 2.3

## 2014-11-19 MED ORDER — WARFARIN SODIUM 5 MG PO TABS: ORAL_TABLET | ORAL | Status: DC

## 2014-11-19 NOTE — Assessment & Plan Note (Signed)
pts signifificant difference in right leg LE edema is likely a result of post-thrombotic syndrome as the DVT was in his right leg. Patient had repeat imaging during hospitalization that did not show any other new etiology for the lower extremity. -Symptomatic management -Education materials provided -pt decline compression stocking at this time.

## 2014-11-19 NOTE — Progress Notes (Signed)
CLINICAL PHARMACIST ANTICOAG NOTE [name] [name] is a 49 y.o. male who reports to the clinic for monitoring of warfarin treatment.    Indication: DVT and PE Duration: indefinite  Anticoagulation Clinic Visit History: Anticoagulation Episode Summary    Current INR goal   Next INR check 12/14/2014  INR from last check 2.3 (11/19/2014)  Weekly max dose   Target end date   INR check location   Preferred lab   Send INR reminders to ANTICOAG IMP   Indications  Long-term (current) use of anticoagulants [Z79.01] DVT (Resolved) [I82.409] 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]        Comments       Anticoagulation Care Providers    Provider Role Specialty Phone number   [name] Crews, MD  Internal Medicine [phone]     ASSESSMENT Recent Results: Recent results are below, the most recent result is correlated with a dose of 75 mg per week: Lab Results  Component Value Date   INR 2.3 11/19/2014   INR 2.26* 11/09/2014   INR 2.09* 11/08/2014    INR today: Therapeutic  Anticoagulation Dosing: INR as of 11/19/2014 and Previous Dosing Information    INR Dt INR Goal [name] [name] Tot Sun Mon Tue Wed Thu Fri Sat   11/19/2014 2.3 - 75 mg 10 mg 12.5 mg 10 mg 10 mg 10 mg 12.5 mg 10 mg    Previous description        Hold x 1, and then decrease as indicated above.    Anticoagulation Dose Instructions as of 11/19/2014      Total Sun Mon Tue Wed Thu Fri Sat   New Dose 75 mg 10 mg 12.5 mg 10 mg 10 mg 10 mg 12.5 mg 10 mg     (5 mg x 2)  (5 mg x 2.5)  (5 mg x 2)  (5 mg x 2)  (5 mg x 2)  (5 mg x 2.5)  (5 mg x 2)                           PLAN Weekly dose was unchanged   Patient Instructions  Patient educated about medication as defined in this encounter and verbalized understanding by repeating back instructions provided.   Follow-up Return in about 1 month (around 12/20/2014) for Follow up INR on 12/17/14.  [name] [name] Clinical Pharmacist  15 minutes spent  face-to-face with the patient during the encounter. 50% of time spent on education. 50% of time was spent on assessment.

## 2014-11-19 NOTE — Progress Notes (Signed)
[name] was referred to CSW for community resources.  CSW met with pt following his scheduled [facility] appointment.  Pt's barrier to care in the past has been transportation and lack of insurance.  [name] is currently staying in a residential treatment facility which provided medical transportation.  He plans to discharge from facility on June 9th and return to his permanent address listed on chart.  [name] completed his [facility] application and was approved.  CSW discussed free transportation available to medical appointments and pharmacy as long as his [facility] card is active.  Completed application and faxed in today, so transportation will be available once pt returns home.  Pt provided with contact information for transportation and CSW contact information.  [name] currently has a SS Disability application pending in [facility].  Plan to apply for Medicaid in [facility].  CSW encouraged pt to take all medical bills with him when he applies.  Pt denies add'l social work needs at this time.

## 2014-11-19 NOTE — Patient Instructions (Signed)
General Instructions:   Please bring your medicines with you each time you come to clinic.  Medicines may include prescription medications, over-the-counter medications, herbal remedies, eye drops, vitamins, or other pills.   Progress Toward Treatment Goals:  Treatment Goal 04/07/2013  Stop smoking smoking less    Self Care Goals & Plans:  Self Care Goal 11/19/2014  Manage my medications take my medicines as prescribed; bring my medications to every visit; refill my medications on time  Monitor my health -  Eat healthy foods drink diet soda or water instead of juice or soda; eat more vegetables; eat foods that are low in salt; eat baked foods instead of fried foods; eat fruit for snacks and desserts  Be physically active -  Stop smoking -    No flowsheet data found.   Care Management & Community Referrals:  Referral 01/02/2013  Referrals made for care management support none needed

## 2014-11-19 NOTE — Telephone Encounter (Signed)
Call to [facility] Dermatology patient has been scheduled for an appointment on 12/16/2014 at 9:00 AM with Dr. Nancy Fetter  to arrive by 8:45 AM.  Patient will be seen at the [facility] office at [address] office number is [phone].  Pt will need to take $100 initially and then apply for assistance  At [phone] and ask for Steven/Financial Counselor.  Will call patient through [place] at [phone].    Sander Nephew, RN 11/19/2014 12:16 PM

## 2014-11-19 NOTE — Progress Notes (Signed)
Subjective:   Patient ID: [name] [name] male   DOB: March 24, 1966 49 y.o.   MRN: [id number]  HPI: [name] is a 49 y.o. man pmh as listed below presents for hospital follow up and need for dermatology referral.   Patient was recently hospitalized on 11/09/14 for cellulitis thought to be centered his superinfection of underlying atopic dermatitis as a result of stopping a steroid cream. Given significant swelling the patient was given 1 dose Lasix and potassium. Since that time the patient states that he has been feeling better.  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 He states that he had biopsies done but did not have any treatment and that his skin lesions have not improved. He's had significant cracking and peeling of the palms and soles of his feet along with ongoing sustained lower extremity edema of his right leg greater than his left leg. This has also been, could by hyperpigmented skin changes now on both legs.  He is currently in a treatment home and has been abstinent from drugs for the past year.   Past Medical History  Diagnosis Date  . DVT (deep venous thrombosis)     on Coumadin Rx  . Substance abuse     crack, cocaine, last use 2007  . Tobacco abuse   . DVT (deep venous thrombosis)   . Bronchitis     last time 2 yrs ago  . Heart murmur   . Shortness of breath   . Tuberculosis     ' I test Positive "  . Asthma     uses Albuterol daily as needed  . Cough     smokers  . Arthritis     knees  . Joint pain   . GERD (gastroesophageal reflux disease)     only takes something about 2 times a yr  . Cellulitis   . Bipolar 1 disorder   . Alcohol abuse   . Pulmonary embolism 2014  . Chronic dermatitis     due to Xarelto Rx   Current Outpatient Prescriptions  Medication Sig Dispense Refill  . acetaminophen (TYLENOL) 500 MG tablet Take 1,000 mg by mouth every 8 (eight)  hours as needed (pain).    Marland Kitchen albuterol (PROVENTIL HFA;VENTOLIN HFA) 108 (90 BASE) MCG/ACT inhaler Inhale 2 puffs into the lungs every 4 (four) hours as needed for wheezing or shortness of breath.     . clobetasol cream (TEMOVATE) 2.95 % Apply 1 application topically 2 (two) times daily. Apply to legs/arms/hands twice daily as needed for itching (Patient not taking: Reported on 11/07/2014) 30 g 0  . doxepin (SINEQUAN) 25 MG capsule Take 1 capsule (25 mg total) by mouth at bedtime as needed (sleep). 30 capsule 0  . doxycycline (VIBRAMYCIN) 100 MG capsule Take 1 capsule (100 mg total) by mouth 2 (two) times daily. 10 capsule 0  . DULoxetine (CYMBALTA) 60 MG capsule Take 1 capsule (60 mg total) by mouth daily. 30 capsule 0  . furosemide (LASIX) 40 MG tablet Take 1 tablet (40 mg total) by mouth daily. For 3 days 3 tablet 0  . gabapentin (NEURONTIN) 100 MG capsule Take 2 capsules (200 mg total) by mouth 3 (three) times daily. (Patient taking differently: Take 200 mg by mouth 3 (three) times daily. 9am, 4pm, 9pm) 180 capsule 0  . hydrOXYzine (ATARAX/VISTARIL) 25 MG tablet Take 1 tablet (25 mg total)  by mouth 3 (three) times daily as needed for itching. 30 tablet 0  . mineral oil-hydrophilic petrolatum (AQUAPHOR) ointment Apply 1 application topically 3 (three) times daily as needed for dry skin or irritation.    . potassium chloride (K-DUR) 10 MEQ tablet Take 1 tablet (10 mEq total) by mouth daily. For 3 days 3 tablet 0  . Skin Protectants, Misc. (EUCERIN) cream Apply 1 application topically 2 (two) times daily as needed for dry skin (skin irritation).    . traZODone (DESYREL) 100 MG tablet Take 1 tablet (100 mg total) by mouth at bedtime as needed for sleep. 14 tablet 0  . warfarin (COUMADIN) 5 MG tablet Take 10-12.5 mg by mouth daily at 6 PM. Take 2 1/2 tablets (12.5 mg) on Monday and Friday, take 2 tablets (10 mg) on Sunday, Tuesday, Wednesday, Thursday, Saturday     No current facility-administered  medications for this visit.   Family History  Problem Relation Age of Onset  . Asthma Mother   . Throat cancer Father    History   Social History  . Marital Status: Single    Spouse Name: N/A  . Number of Children: N/A  . Years of Education: N/A   Occupational History  . Details Cars    Social History Main Topics  . Smoking status: Current Every Day Smoker -- 0.25 packs/day for 25 years    Types: Cigarettes  . Smokeless tobacco: Former Systems developer    Types: Chew     Comment: "stopped chewing in the 1990's"  . Alcohol Use: 2.4 oz/week    4 Cans of beer per week  . Drug Use: Yes    Special: Cocaine     Comment: hx of-last time in 2007  . Sexual Activity: Yes   Other Topics Concern  . None   Social History Narrative   Working at [employer] currently.  States he has insurance through them now.             Review of Systems: Pertinent items are noted in HPI. Objective:  Physical Exam: Filed Vitals:   11/19/14 1022  BP: 107/80  Pulse: 96  Temp: 97.6 F (36.4 C)  TempSrc: Oral  Height: 6\' 4"  (1.93 m)  Weight: 255 lb (115.667 kg)  SpO2: 100%   General: sitting in chair, NAD  HEENT: PERRL, EOMI, no scleral icterus Cardiac: RRR, no rubs, murmurs or gallops Pulm: clear to auscultation bilaterally, moving normal volumes of air Abd: soft, nontender, nondistended, BS present Ext: warm and well perfused, 2-3 +pedal edema of right leg, large areas of hyperpigmentation along both anterior and dorsal aspects of the lower extremity, hypertrophic lesions in palmar creases and on the soles of the patient's feet, large hypertrophic plaques along extensor surfaces of the upper extremities and along buttocks, no open areas of induration or erythema Neuro: alert and oriented X3, cranial nerves II-XII grossly intact  Assessment & Plan:  Please see problem oriented charting  Pt discussed and seen with Dr. Beryle Beams

## 2014-11-19 NOTE — Assessment & Plan Note (Signed)
Very significant surface area distribution on saturated mostly along the lower extremities bilaterally. Patient has been using Kenalog ointment and Cetaphil daily which is only been providing minimal relief. The patient states that he previously had biopsies performed at [facility] and what appears to have been a Stevens-Johnson-like reaction several years ago after starting Xarelto. He has not had any significant improvement since that time. The patient was lost to follow-up and is now residing in a treatment home and would like to reestablish care with a dermatologist. -Social work referral for resources -Dermatology referral appears patient has been seen at [facility] will try and reestablish care -Continue emollient daily therapy to prevent breaks in skin

## 2014-11-19 NOTE — Assessment & Plan Note (Signed)
This has greatly improved and resolution of infection.

## 2014-11-19 NOTE — Progress Notes (Signed)
Medicine attending: I personally interviewed and briefly examined this patient and reviewed pertinent laboratory  data together with resident physician Dr.Nora Algis Liming and I concur with her evaluation and management plan. 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 Initially unclear whether or not he was reacting to the antibiotic but severe cutaneous reaction persisted off clindamycin and is resolving off Xarelto. 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

## 2014-11-19 NOTE — Patient Instructions (Signed)
Patient educated about medication as defined in this encounter and verbalized understanding by repeating back instructions provided.   

## 2014-11-26 ENCOUNTER — Emergency Department (HOSPITAL_COMMUNITY)
Admission: EM | Admit: 2014-11-26 | Discharge: 2014-11-27 | Disposition: A | Discharge status: Home / Self Care | Payer: Self-pay | Attending: Emergency Medicine | Admitting: Emergency Medicine

## 2014-11-26 ENCOUNTER — Encounter (HOSPITAL_COMMUNITY): Payer: Self-pay | Admitting: Adult Health

## 2014-11-26 DIAGNOSIS — Z8719 Personal history of other diseases of the digestive system: Secondary | ICD-10-CM | POA: Insufficient documentation

## 2014-11-26 DIAGNOSIS — M7989 Other specified soft tissue disorders: Secondary | ICD-10-CM | POA: Insufficient documentation

## 2014-11-26 DIAGNOSIS — Z8611 Personal history of tuberculosis: Secondary | ICD-10-CM | POA: Insufficient documentation

## 2014-11-26 DIAGNOSIS — Z792 Long term (current) use of antibiotics: Secondary | ICD-10-CM | POA: Insufficient documentation

## 2014-11-26 DIAGNOSIS — Z86711 Personal history of pulmonary embolism: Secondary | ICD-10-CM | POA: Insufficient documentation

## 2014-11-26 DIAGNOSIS — R011 Cardiac murmur, unspecified: Secondary | ICD-10-CM | POA: Insufficient documentation

## 2014-11-26 DIAGNOSIS — Z720 Tobacco use: Secondary | ICD-10-CM | POA: Insufficient documentation

## 2014-11-26 DIAGNOSIS — Z86718 Personal history of other venous thrombosis and embolism: Secondary | ICD-10-CM | POA: Insufficient documentation

## 2014-11-26 DIAGNOSIS — Z79899 Other long term (current) drug therapy: Secondary | ICD-10-CM | POA: Insufficient documentation

## 2014-11-26 DIAGNOSIS — F319 Bipolar disorder, unspecified: Secondary | ICD-10-CM | POA: Insufficient documentation

## 2014-11-26 DIAGNOSIS — J45909 Unspecified asthma, uncomplicated: Secondary | ICD-10-CM | POA: Insufficient documentation

## 2014-11-26 DIAGNOSIS — R600 Localized edema: Secondary | ICD-10-CM

## 2014-11-26 DIAGNOSIS — L03115 Cellulitis of right lower limb: Secondary | ICD-10-CM | POA: Insufficient documentation

## 2014-11-26 LAB — CBC WITH DIFFERENTIAL/PLATELET
BASOS PCT: 0 % (ref 0–1)
Basophils Absolute: 0 10*3/uL (ref 0.0–0.1)
EOS ABS: 0.2 10*3/uL (ref 0.0–0.7)
EOS PCT: 2 % (ref 0–5)
HEMATOCRIT: 46.5 % (ref 39.0–52.0)
Hemoglobin: 15.5 g/dL (ref 13.0–17.0)
Lymphocytes Relative: 17 % (ref 12–46)
Lymphs Abs: 1.2 10*3/uL (ref 0.7–4.0)
MCH: 31.1 pg (ref 26.0–34.0)
MCHC: 33.3 g/dL (ref 30.0–36.0)
MCV: 93.4 fL (ref 78.0–100.0)
MONO ABS: 0.6 10*3/uL (ref 0.1–1.0)
MONOS PCT: 8 % (ref 3–12)
Neutro Abs: 5 10*3/uL (ref 1.7–7.7)
Neutrophils Relative %: 73 % (ref 43–77)
Platelets: 216 10*3/uL (ref 150–400)
RBC: 4.98 MIL/uL (ref 4.22–5.81)
RDW: 13.9 % (ref 11.5–15.5)
WBC: 7 10*3/uL (ref 4.0–10.5)

## 2014-11-26 LAB — BASIC METABOLIC PANEL
ANION GAP: 9 (ref 5–15)
BUN: 10 mg/dL (ref 6–20)
CO2: 26 mmol/L (ref 22–32)
Calcium: 9.6 mg/dL (ref 8.9–10.3)
Chloride: 104 mmol/L (ref 101–111)
Creatinine, Ser: 1.03 mg/dL (ref 0.61–1.24)
Glucose, Bld: 103 mg/dL — ABNORMAL HIGH (ref 65–99)
Potassium: 4.5 mmol/L (ref 3.5–5.1)
Sodium: 139 mmol/L (ref 135–145)
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
## 2014-11-26 NOTE — ED Notes (Signed)
Presents with right leg swelling, redness and weeping- cellulitis off and on for 7 months-this episode began 4 days ago. Rates pain 8/10-unable to take narcotics-taking tylenol

## 2014-11-27 MED ORDER — LIDOCAINE 5 % EX PTCH
3.0000 | MEDICATED_PATCH | Freq: Once | CUTANEOUS | Status: DC
  Administered 2014-11-27: 3 via TRANSDERMAL
  Filled 2014-11-27: qty 3

## 2014-11-27 MED ORDER — DOXYCYCLINE HYCLATE 100 MG PO TABS
100.0000 mg | ORAL_TABLET | Freq: Once | ORAL | Status: AC
  Administered 2014-11-27: 100 mg via ORAL
  Filled 2014-11-27: qty 1

## 2014-11-27 MED ORDER — FUROSEMIDE 20 MG PO TABS
40.0000 mg | ORAL_TABLET | Freq: Once | ORAL | Status: AC
  Administered 2014-11-27: 40 mg via ORAL
  Filled 2014-11-27: qty 2

## 2014-11-27 MED ORDER — CEPHALEXIN 500 MG PO CAPS: 500.0000 mg | ORAL_CAPSULE | Freq: Two times a day (BID) | ORAL | Status: DC

## 2014-11-27 MED ORDER — FUROSEMIDE 20 MG PO TABS: 20.0000 mg | ORAL_TABLET | Freq: Every day | ORAL | Status: DC

## 2014-11-27 MED ORDER — DOXYCYCLINE HYCLATE 100 MG PO CAPS
100.0000 mg | ORAL_CAPSULE | Freq: Two times a day (BID) | ORAL | Status: DC
Start: 2014-11-27 — End: 2014-12-02

## 2014-11-27 MED ORDER — CEPHALEXIN 250 MG PO CAPS
500.0000 mg | ORAL_CAPSULE | Freq: Once | ORAL | Status: AC
  Administered 2014-11-27: 500 mg via ORAL
  Filled 2014-11-27: qty 2

## 2014-11-27 NOTE — ED Notes (Signed)
Pt. Left with all belongings 

## 2014-11-27 NOTE — Discharge Instructions (Signed)
Cellulitis [name] [name], take lasix medication for one week as prescribed. Take antibiotics for one week as well for possible cellulitis.  See your dermatologist within 3 days for close follow up.  Call to make the appointment.  If there is any worsening, come back to the ED immediately. Thank you. Cellulitis is an infection of the skin and the tissue under the skin. The infected area is usually red and tender. This happens most often in the arms and lower legs. HOME CARE   Take your antibiotic medicine as told. Finish the medicine even if you start to feel better.  Keep the infected arm or leg raised (elevated).  Put a warm cloth on the area up to 4 times per day.  Only take medicines as told by your doctor.  Keep all doctor visits as told. GET HELP IF:  You see red streaks on the skin coming from the infected area.  Your red area gets bigger or turns a dark color.  Your bone or joint under the infected area is painful after the skin heals.  Your infection comes back in the same area or different area.  You have a puffy (swollen) bump in the infected area.  You have new symptoms.  You have a fever. GET HELP RIGHT AWAY IF:   You feel very sleepy.  You throw up (vomit) or have watery poop (diarrhea).  You feel sick and have muscle aches and pains. MAKE SURE YOU:   Understand these instructions.  Will watch your condition.  Will get help right away if you are not doing well or get worse. Document Released: 12/13/2007 Document Revised: 11/10/2013 Document Reviewed: 09/11/2011 [facility] Patient Information 2015 [place], [place]. This information is not intended to replace advice given to you by your health care provider. Make sure you discuss any questions you have with your health care provider.

## 2014-11-29 ENCOUNTER — Encounter (HOSPITAL_COMMUNITY): Payer: Self-pay | Admitting: Physical Medicine and Rehabilitation

## 2014-11-29 ENCOUNTER — Emergency Department (HOSPITAL_BASED_OUTPATIENT_CLINIC_OR_DEPARTMENT_OTHER): Admit: 2014-11-29 | Discharge: 2014-11-29 | Disposition: A | Discharge status: Home / Self Care | Payer: Self-pay

## 2014-11-29 ENCOUNTER — Other Ambulatory Visit: Payer: Self-pay

## 2014-11-29 ENCOUNTER — Inpatient Hospital Stay (HOSPITAL_COMMUNITY)
Admission: EM | Admit: 2014-11-29 | Discharge: 2014-12-02 | DRG: 603 | Payer: Self-pay | Attending: Internal Medicine | Admitting: Internal Medicine

## 2014-11-29 DIAGNOSIS — I82401 Acute embolism and thrombosis of unspecified deep veins of right lower extremity: Secondary | ICD-10-CM

## 2014-11-29 DIAGNOSIS — T45516A Underdosing of anticoagulants, initial encounter: Secondary | ICD-10-CM | POA: Diagnosis present

## 2014-11-29 DIAGNOSIS — L309 Dermatitis, unspecified: Secondary | ICD-10-CM | POA: Diagnosis present

## 2014-11-29 DIAGNOSIS — G894 Chronic pain syndrome: Secondary | ICD-10-CM | POA: Diagnosis present

## 2014-11-29 DIAGNOSIS — Z881 Allergy status to other antibiotic agents status: Secondary | ICD-10-CM

## 2014-11-29 DIAGNOSIS — M7989 Other specified soft tissue disorders: Secondary | ICD-10-CM

## 2014-11-29 DIAGNOSIS — F1910 Other psychoactive substance abuse, uncomplicated: Secondary | ICD-10-CM | POA: Diagnosis present

## 2014-11-29 DIAGNOSIS — Z7901 Long term (current) use of anticoagulants: Secondary | ICD-10-CM

## 2014-11-29 DIAGNOSIS — Z888 Allergy status to other drugs, medicaments and biological substances status: Secondary | ICD-10-CM

## 2014-11-29 DIAGNOSIS — Z86711 Personal history of pulmonary embolism: Secondary | ICD-10-CM

## 2014-11-29 DIAGNOSIS — I5032 Chronic diastolic (congestive) heart failure: Secondary | ICD-10-CM | POA: Diagnosis present

## 2014-11-29 DIAGNOSIS — L03119 Cellulitis of unspecified part of limb: Secondary | ICD-10-CM

## 2014-11-29 DIAGNOSIS — T45515S Adverse effect of anticoagulants, sequela: Secondary | ICD-10-CM

## 2014-11-29 DIAGNOSIS — J4540 Moderate persistent asthma, uncomplicated: Secondary | ICD-10-CM | POA: Diagnosis present

## 2014-11-29 DIAGNOSIS — F17200 Nicotine dependence, unspecified, uncomplicated: Secondary | ICD-10-CM | POA: Diagnosis present

## 2014-11-29 DIAGNOSIS — Z79899 Other long term (current) drug therapy: Secondary | ICD-10-CM

## 2014-11-29 DIAGNOSIS — I82531 Chronic embolism and thrombosis of right popliteal vein: Secondary | ICD-10-CM | POA: Diagnosis present

## 2014-11-29 DIAGNOSIS — L03115 Cellulitis of right lower limb: Principal | ICD-10-CM | POA: Diagnosis present

## 2014-11-29 DIAGNOSIS — I82501 Chronic embolism and thrombosis of unspecified deep veins of right lower extremity: Secondary | ICD-10-CM

## 2014-11-29 DIAGNOSIS — F17210 Nicotine dependence, cigarettes, uncomplicated: Secondary | ICD-10-CM | POA: Diagnosis present

## 2014-11-29 DIAGNOSIS — F332 Major depressive disorder, recurrent severe without psychotic features: Secondary | ICD-10-CM

## 2014-11-29 DIAGNOSIS — F319 Bipolar disorder, unspecified: Secondary | ICD-10-CM | POA: Diagnosis present

## 2014-11-29 DIAGNOSIS — I872 Venous insufficiency (chronic) (peripheral): Secondary | ICD-10-CM | POA: Diagnosis present

## 2014-11-29 DIAGNOSIS — J45909 Unspecified asthma, uncomplicated: Secondary | ICD-10-CM | POA: Diagnosis present

## 2014-11-29 DIAGNOSIS — I878 Other specified disorders of veins: Secondary | ICD-10-CM | POA: Diagnosis present

## 2014-11-29 DIAGNOSIS — K219 Gastro-esophageal reflux disease without esophagitis: Secondary | ICD-10-CM | POA: Diagnosis present

## 2014-11-29 DIAGNOSIS — I82511 Chronic embolism and thrombosis of right femoral vein: Secondary | ICD-10-CM | POA: Diagnosis present

## 2014-11-29 DIAGNOSIS — Z22322 Carrier or suspected carrier of Methicillin resistant Staphylococcus aureus: Secondary | ICD-10-CM

## 2014-11-29 DIAGNOSIS — Z791 Long term (current) use of non-steroidal anti-inflammatories (NSAID): Secondary | ICD-10-CM

## 2014-11-29 DIAGNOSIS — I2699 Other pulmonary embolism without acute cor pulmonale: Secondary | ICD-10-CM | POA: Diagnosis present

## 2014-11-29 DIAGNOSIS — Z720 Tobacco use: Secondary | ICD-10-CM

## 2014-11-29 DIAGNOSIS — I87009 Postthrombotic syndrome without complications of unspecified extremity: Secondary | ICD-10-CM | POA: Diagnosis present

## 2014-11-29 LAB — COMPREHENSIVE METABOLIC PANEL
ALT: 39 U/L (ref 17–63)
ANION GAP: 9 (ref 5–15)
AST: 23 U/L (ref 15–41)
Albumin: 3.8 g/dL (ref 3.5–5.0)
Alkaline Phosphatase: 119 U/L (ref 38–126)
BILIRUBIN TOTAL: 0.6 mg/dL (ref 0.3–1.2)
BUN: 13 mg/dL (ref 6–20)
CO2: 27 mmol/L (ref 22–32)
Calcium: 9.6 mg/dL (ref 8.9–10.3)
Chloride: 101 mmol/L (ref 101–111)
Creatinine, Ser: 1.04 mg/dL (ref 0.61–1.24)
Glucose, Bld: 91 mg/dL (ref 65–99)
Potassium: 4 mmol/L (ref 3.5–5.1)
SODIUM: 137 mmol/L (ref 135–145)
Total Protein: 6.9 g/dL (ref 6.5–8.1)

## 2014-11-29 LAB — PROTIME-INR
INR: 1.45 (ref 0.00–1.49)
Prothrombin Time: 17.8 seconds — ABNORMAL HIGH (ref 11.6–15.2)

## 2014-11-29 LAB — CBC WITH DIFFERENTIAL/PLATELET
BASOS ABS: 0 10*3/uL (ref 0.0–0.1)
Basophils Relative: 1 % (ref 0–1)
Eosinophils Absolute: 0.2 10*3/uL (ref 0.0–0.7)
Eosinophils Relative: 4 % (ref 0–5)
HEMATOCRIT: 47.7 % (ref 39.0–52.0)
HEMOGLOBIN: 15.9 g/dL (ref 13.0–17.0)
Lymphocytes Relative: 31 % (ref 12–46)
Lymphs Abs: 1.4 10*3/uL (ref 0.7–4.0)
MCH: 30.9 pg (ref 26.0–34.0)
MCHC: 33.3 g/dL (ref 30.0–36.0)
MCV: 92.6 fL (ref 78.0–100.0)
MONOS PCT: 12 % (ref 3–12)
Monocytes Absolute: 0.5 10*3/uL (ref 0.1–1.0)
NEUTROS ABS: 2.5 10*3/uL (ref 1.7–7.7)
Neutrophils Relative %: 52 % (ref 43–77)
PLATELETS: 235 10*3/uL (ref 150–400)
RBC: 5.15 MIL/uL (ref 4.22–5.81)
RDW: 13.7 % (ref 11.5–15.5)
WBC: 4.7 10*3/uL (ref 4.0–10.5)

## 2014-11-29 MED ORDER — VANCOMYCIN HCL IN DEXTROSE 1-5 GM/200ML-% IV SOLN
1000.0000 mg | Freq: Three times a day (TID) | INTRAVENOUS | Status: DC
  Administered 2014-11-30 – 2014-12-01 (×4): 1000 mg via INTRAVENOUS
  Filled 2014-11-29 (×6): qty 200

## 2014-11-29 MED ORDER — LIDOCAINE 5 % EX OINT
1.0000 "application " | TOPICAL_OINTMENT | Freq: Every day | CUTANEOUS | Status: DC
  Administered 2014-11-30 – 2014-12-02 (×3): 1 via TOPICAL
  Filled 2014-11-29: qty 35.44

## 2014-11-29 MED ORDER — VANCOMYCIN HCL 10 G IV SOLR
1500.0000 mg | Freq: Two times a day (BID) | INTRAVENOUS | Status: DC
  Filled 2014-11-29: qty 1500

## 2014-11-29 MED ORDER — CLOBETASOL PROPIONATE 0.05 % EX CREA
1.0000 "application " | TOPICAL_CREAM | Freq: Every day | CUTANEOUS | Status: DC
  Administered 2014-11-30 – 2014-12-02 (×3): 1 via TOPICAL
  Filled 2014-11-29: qty 15

## 2014-11-29 MED ORDER — ACETAMINOPHEN 500 MG PO TABS
1000.0000 mg | ORAL_TABLET | Freq: Three times a day (TID) | ORAL | Status: DC | PRN
  Administered 2014-11-29: 1000 mg via ORAL
  Filled 2014-11-29: qty 2

## 2014-11-29 MED ORDER — VANCOMYCIN HCL IN DEXTROSE 1-5 GM/200ML-% IV SOLN
1000.0000 mg | INTRAVENOUS | Status: AC
  Administered 2014-11-30: 1000 mg via INTRAVENOUS
  Filled 2014-11-29: qty 200

## 2014-11-29 MED ORDER — NICOTINE 21 MG/24HR TD PT24
21.0000 mg | MEDICATED_PATCH | Freq: Every day | TRANSDERMAL | Status: DC
Start: 2014-11-30 — End: 2014-12-02
  Administered 2014-11-30 – 2014-12-02 (×3): 21 mg via TRANSDERMAL
  Filled 2014-11-29 (×3): qty 1

## 2014-11-29 MED ORDER — GABAPENTIN 100 MG PO CAPS
200.0000 mg | ORAL_CAPSULE | Freq: Three times a day (TID) | ORAL | Status: DC
  Administered 2014-11-30 – 2014-12-02 (×9): 200 mg via ORAL
  Filled 2014-11-29 (×10): qty 2

## 2014-11-29 MED ORDER — PIPERACILLIN-TAZOBACTAM 3.375 G IVPB
3.3750 g | Freq: Three times a day (TID) | INTRAVENOUS | Status: DC
  Filled 2014-11-29: qty 50

## 2014-11-29 MED ORDER — PIPERACILLIN-TAZOBACTAM 3.375 G IVPB
3.3750 g | Freq: Three times a day (TID) | INTRAVENOUS | Status: DC
  Administered 2014-11-30 – 2014-12-01 (×5): 3.375 g via INTRAVENOUS
  Filled 2014-11-29 (×7): qty 50

## 2014-11-29 MED ORDER — SODIUM CHLORIDE 0.9 % IJ SOLN
3.0000 mL | Freq: Two times a day (BID) | INTRAMUSCULAR | Status: DC
  Administered 2014-11-30: 3 mL via INTRAVENOUS

## 2014-11-29 MED ORDER — HEPARIN BOLUS VIA INFUSION
2000.0000 [IU] | Freq: Once | INTRAVENOUS | Status: AC
  Administered 2014-11-30: 2000 [IU] via INTRAVENOUS
  Filled 2014-11-29: qty 2000

## 2014-11-29 MED ORDER — VANCOMYCIN HCL IN DEXTROSE 1-5 GM/200ML-% IV SOLN
1000.0000 mg | Freq: Once | INTRAVENOUS | Status: AC
  Administered 2014-11-29: 1000 mg via INTRAVENOUS
  Filled 2014-11-29: qty 200

## 2014-11-29 MED ORDER — ONDANSETRON HCL 4 MG/2ML IJ SOLN: 4.0000 mg | Freq: Four times a day (QID) | INTRAMUSCULAR | Status: DC | PRN

## 2014-11-29 MED ORDER — TRAZODONE HCL 100 MG PO TABS
100.0000 mg | ORAL_TABLET | Freq: Every evening | ORAL | Status: DC | PRN
  Administered 2014-11-30 – 2014-12-01 (×2): 100 mg via ORAL
  Filled 2014-11-29 (×4): qty 1

## 2014-11-29 MED ORDER — MUPIROCIN 2 % EX OINT
1.0000 "application " | TOPICAL_OINTMENT | Freq: Two times a day (BID) | CUTANEOUS | Status: DC
  Administered 2014-11-30 – 2014-12-02 (×6): 1 via NASAL
  Filled 2014-11-29 (×2): qty 22

## 2014-11-29 MED ORDER — DOXEPIN HCL 25 MG PO CAPS
25.0000 mg | ORAL_CAPSULE | Freq: Every evening | ORAL | Status: DC | PRN
  Administered 2014-12-01: 25 mg via ORAL
  Filled 2014-11-29 (×3): qty 1

## 2014-11-29 MED ORDER — HYDROXYZINE HCL 25 MG PO TABS
25.0000 mg | ORAL_TABLET | Freq: Three times a day (TID) | ORAL | Status: DC | PRN
  Administered 2014-11-30: 25 mg via ORAL
  Filled 2014-11-29: qty 1

## 2014-11-29 MED ORDER — ALBUTEROL SULFATE (2.5 MG/3ML) 0.083% IN NEBU: 3.0000 mL | INHALATION_SOLUTION | RESPIRATORY_TRACT | Status: DC | PRN

## 2014-11-29 MED ORDER — AQUAPHOR EX OINT: 1.0000 "application " | TOPICAL_OINTMENT | Freq: Three times a day (TID) | CUTANEOUS | Status: DC | PRN

## 2014-11-29 MED ORDER — ONDANSETRON HCL 4 MG PO TABS: 4.0000 mg | ORAL_TABLET | Freq: Four times a day (QID) | ORAL | Status: DC | PRN

## 2014-11-29 MED ORDER — ALUM & MAG HYDROXIDE-SIMETH 200-200-20 MG/5ML PO SUSP: 30.0000 mL | Freq: Four times a day (QID) | ORAL | Status: DC | PRN

## 2014-11-29 MED ORDER — GUAIFENESIN-DM 100-10 MG/5ML PO SYRP
5.0000 mL | ORAL_SOLUTION | ORAL | Status: DC | PRN
  Filled 2014-11-29: qty 5

## 2014-11-29 MED ORDER — DULOXETINE HCL 60 MG PO CPEP
60.0000 mg | ORAL_CAPSULE | Freq: Every day | ORAL | Status: DC
  Administered 2014-11-30 – 2014-12-02 (×3): 60 mg via ORAL
  Filled 2014-11-29 (×3): qty 1

## 2014-11-29 MED ORDER — HEPARIN (PORCINE) IN NACL 100-0.45 UNIT/ML-% IJ SOLN
1800.0000 [IU]/h | INTRAMUSCULAR | Status: DC
  Administered 2014-11-30: 1400 [IU]/h via INTRAVENOUS
  Filled 2014-11-29 (×4): qty 250

## 2014-11-29 MED ORDER — CHLORHEXIDINE GLUCONATE CLOTH 2 % EX PADS
6.0000 | MEDICATED_PAD | Freq: Every day | CUTANEOUS | Status: DC
  Administered 2014-11-30 – 2014-12-02 (×3): 6 via TOPICAL
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
## 2014-11-29 NOTE — ED Notes (Signed)
Pt presents to department for evaluation of R leg pain. States chronic issues with same. Reports swelling/pain to R leg, also states dry skin and itching. Pedal pulses present. Sensation intact. R leg swollen and tender to touch upon arrival to ED.

## 2014-11-29 NOTE — Progress Notes (Signed)
ANTICOAGULATION and Antibiotic CONSULT NOTE - Initial Consult  Pharmacy Consult for  Heparin, Vancomycin and Zosyn Indication: hx of PE and recurrent DVT, on coumadin Vanc & zosyn for Cellulitis/sepsis    Allergies  Allergen Reactions  . Xarelto [Rivaroxaban] Dermatitis    Blisters skin  . Clindamycin/Lincomycin Dermatitis    Severe rash/dermatitis from November 2015 still present Jan 2016 from [facility]inda    Patient Measurements: Height: 6\' 4"  (193 cm) Weight: 240 lb 4.8 oz (108.999 kg) IBW/kg (Calculated) : 86.8 Heparin Dosing Weight: 108.9 kg  Vital Signs: Temp: 97.7 F (36.5 C) (05/22 2244) Temp Source: Oral (05/22 2244) BP: 116/88 mmHg (05/22 2244) Pulse Rate: 80 (05/22 2244)  Labs:  Recent Labs  11/29/14 1801 11/29/14 1839  HGB 15.9  --   HCT 47.7  --   PLT 235  --   LABPROT  --  17.8*  INR  --  1.45  CREATININE 1.04  --     Estimated Creatinine Clearance: 117.6 mL/min (by C-G formula based on Cr of 1.04).  Labs:  Recent Labs  11/29/14 1801  WBC 4.7  HGB 15.9  PLT 235  CREATININE 1.04   Estimated Creatinine Clearance: 117.6 mL/min (by C-G formula based on Cr of 1.04). No results for input(s): VANCOTROUGH, VANCOPEAK, VANCORANDOM, GENTTROUGH, GENTPEAK, GENTRANDOM, TOBRATROUGH, TOBRAPEAK, TOBRARND, AMIKACINPEAK, AMIKACINTROU, AMIKACIN in the last 72 hours.   Microbiology: Recent Results (from the past 720 hour(s))  MRSA PCR Screening     Status: Abnormal   Collection Time: 11/07/14 10:20 PM  Result Value Ref Range Status   MRSA by PCR POSITIVE (A) NEGATIVE Final    Comment:        The GeneXpert MRSA Assay (FDA approved for NASAL specimens only), is one component of a comprehensive MRSA colonization surveillance program. It is not intended to diagnose MRSA infection nor to guide or monitor treatment for MRSA infections. RESULT CALLED TO, READ BACK BY AND VERIFIED WITH: JETER,A RN [place] 11/08/14 MITCHELL,L        Medical History: Past  Medical History  Diagnosis Date  . DVT (deep venous thrombosis)     on Coumadin Rx  . Substance abuse     crack, cocaine, last use 2007  . Tobacco abuse   . DVT (deep venous thrombosis)   . Bronchitis     last time 2 yrs ago  . Heart murmur   . Shortness of breath   . Tuberculosis     ' I test Positive "  . Asthma     uses Albuterol daily as needed  . Cough     smokers  . Arthritis     knees  . Joint pain   . GERD (gastroesophageal reflux disease)     only takes something about 2 times a yr  . Cellulitis   . Bipolar 1 disorder   . Alcohol abuse   . Pulmonary embolism 2014  . Chronic dermatitis     due to Xarelto Rx    Medications:  Prescriptions prior to admission  Medication Sig Dispense Refill Last Dose  . acetaminophen (TYLENOL) 500 MG tablet Take 1,000 mg by mouth every 8 (eight) hours as needed (pain).   11/28/2014  . albuterol (PROVENTIL HFA;VENTOLIN HFA) 108 (90 BASE) MCG/ACT inhaler Inhale 2 puffs into the lungs every 4 (four) hours as needed for wheezing or shortness of breath.    11/17/2014  . cephALEXin (KEFLEX) 500 MG capsule Take 1 capsule (500 mg total) by mouth 2 (two) times  daily. 14 capsule 0 11/29/2014 at Unknown time  . clobetasol cream (TEMOVATE) 6.38 % Apply 1 application topically 2 (two) times daily. Apply to legs/arms/hands twice daily as needed for itching (Patient taking differently: Apply 1 application topically daily. ) 30 g 0 11/29/2014 at Unknown time  . doxepin (SINEQUAN) 25 MG capsule Take 1 capsule (25 mg total) by mouth at bedtime as needed (sleep). 30 capsule 0 11/28/2014 at Unknown time  . doxycycline (VIBRAMYCIN) 100 MG capsule Take 1 capsule (100 mg total) by mouth 2 (two) times daily. One po bid x 7 days 14 capsule 0 11/29/2014 at Unknown time  . DULoxetine (CYMBALTA) 60 MG capsule Take 1 capsule (60 mg total) by mouth daily. 30 capsule 0 11/28/2014 at Unknown time  . furosemide (LASIX) 20 MG tablet Take 1 tablet (20 mg total) by mouth daily. 7  tablet 0 11/29/2014 at Unknown time  . gabapentin (NEURONTIN) 100 MG capsule Take 2 capsules (200 mg total) by mouth 3 (three) times daily. (Patient taking differently: Take 200 mg by mouth 3 (three) times daily. 9am, 4pm, 9pm) 180 capsule 0 11/29/2014 at Unknown time  . hydrOXYzine (ATARAX/VISTARIL) 25 MG tablet Take 1 tablet (25 mg total) by mouth 3 (three) times daily as needed for itching. 30 tablet 0 11/29/2014 at Unknown time  . lidocaine (XYLOCAINE) 5 % ointment Apply 1 application topically daily.   11/29/2014 at Unknown time  . mineral oil-hydrophilic petrolatum (AQUAPHOR) ointment Apply 1 application topically 3 (three) times daily as needed for dry skin or irritation.   11/13/2014  . mupirocin nasal ointment (BACTROBAN) 2 % Place 1 application into the nose daily. Use one-half of tube in each nostril twice daily for five (5) days. After application, press sides of nose together and gently massage.   11/29/2014 at Unknown time  . Skin Protectants, Misc. (EUCERIN) cream Apply 1 application topically 2 (two) times daily as needed for dry skin (skin irritation).   over 30 days  . traZODone (DESYREL) 100 MG tablet Take 1 tablet (100 mg total) by mouth at bedtime as needed for sleep. 14 tablet 0 11/28/2014 at Unknown time  . warfarin (COUMADIN) 5 MG tablet Take 2 1/2 tablets (12.5 mg) on Mon and Fri, take 2 tablets (10 mg) on Sun, Tue, Wed, Thur, Sat (Patient taking differently: Take 10-12.5 mg by mouth daily at 6 PM. Take 12.5 mg by mouth on Monday and Friday. Take 10 mg by mouth daily on Sunday, Tuesday, Wednesday, Thursday, and Saturday.) 65 tablet 3 11/28/2014 at Unknown time   Scheduled:  . [START ON 11/30/2014] clobetasol cream  1 application Topical Daily  . [START ON 11/30/2014] DULoxetine  60 mg Oral Daily  . gabapentin  200 mg Oral TID  . [START ON 11/30/2014] lidocaine  1 application Topical Daily  . [START ON 11/30/2014] nicotine  21 mg Transdermal Daily  . sodium chloride  3 mL Intravenous Q12H     Assessment: 49 y.o male with hx of PE and recurrent DVT, on chronic Coumadin, but INR=1.45, subtherapeutic on admission. RLE is positive for age indeterminate deep vein thrombosis involving the right femoral and popliteal veins. Prior study from 11/06/2014 visualized thrombosis in the right femoral and popliteal veins at that time, so this may be chronic.  Pharmacy consulted to dose IV heparin drip.   He presented to ED today 11/29/14 with exacerbation of his chronic right leg pain and leg swelling, more areas of weeping and drainage. Pharmacy consulted to dose vancomycin and  zosyn.  Vancomycin 1gm dose x1 given @ 20:00 SCr 1.04, CrCl >100 ml/min, WBC 17.8  PTA coumadin dose: 10mg  qTWTSS, 12.5 mg qMF, last taken on 11/28/14.  Goal of Therapy:  Heparin level 0.3-0.7 units/ml Monitor platelets by anticoagulation protocol: Yes  Vancomycin trough 15-20 mcg/ml   Plan:  Give extra Vancomycin 1 g now to total 2 g loading dose then Vancomycin 1gm IV q8h Zosyn 3.375 gm IV q8h (4hr infusion) Monitor clinical status, renal function, culture results if done daily  Heparin 2000 units IV x1 and drip at 1400 units/hr (~13 units/kg/hr) 6 hour heparin level Daily heparin level and CBC Follow up on plan for resuming coumadin.    Nicole Cella, RPh Clinical Pharmacist Pager: [phone] 11/29/2014,10:49 PM

## 2014-11-29 NOTE — Progress Notes (Signed)
Report taken from Andee Poles, [place] in ED.

## 2014-11-29 NOTE — Progress Notes (Signed)
Patient arrived to unit via stretcher to [phone]. Patient alert and oriented x 4, in no distress at this time. IV intact. Dry and flaky skin noted to bilateral upper and lower extremities. RLE edema > LLE edema. Pt oriented to unit and room, call bell within reach. Pt able to verbalize and demonstrate how to get into contact with nursing staff. Placed on telemetry box #20. Will continue to monitor patient per MD orders.
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
## 2014-11-29 NOTE — Progress Notes (Signed)
*  Preliminary Results* Right lower extremity venous duplex completed. Right lower extremity is positive for age indeterminate deep vein thrombosis involving the right femoral and popliteal veins. Prior study from 11/06/2014 visualized thrombosis in the right femoral and popliteal veins at that time, so this may be chronic. There is no evidence of right Baker's cyst.  Incidental finding: There are multiple heterogenous areas of the right groin with the largest measuring 3cm, suggestive of enlarged inguinal lymph nodes.  Preliminary results discussed with Andee Poles, RN.  11/29/2014 7:08 PM  Maudry Mayhew, RVT, RDCS, RDMS

## 2014-11-30 ENCOUNTER — Encounter (HOSPITAL_COMMUNITY): Payer: Self-pay | Admitting: Internal Medicine

## 2014-11-30 ENCOUNTER — Inpatient Hospital Stay (HOSPITAL_COMMUNITY): Payer: Self-pay

## 2014-11-30 DIAGNOSIS — R600 Localized edema: Secondary | ICD-10-CM

## 2014-11-30 DIAGNOSIS — J4520 Mild intermittent asthma, uncomplicated: Secondary | ICD-10-CM

## 2014-11-30 LAB — COMPREHENSIVE METABOLIC PANEL
ALT: 38 U/L (ref 17–63)
ANION GAP: 10 (ref 5–15)
AST: 24 U/L (ref 15–41)
Albumin: 3.3 g/dL — ABNORMAL LOW (ref 3.5–5.0)
Alkaline Phosphatase: 77 U/L (ref 38–126)
BILIRUBIN TOTAL: 0.5 mg/dL (ref 0.3–1.2)
BUN: 11 mg/dL (ref 6–20)
CO2: 27 mmol/L (ref 22–32)
Calcium: 8.8 mg/dL — ABNORMAL LOW (ref 8.9–10.3)
Chloride: 99 mmol/L — ABNORMAL LOW (ref 101–111)
Creatinine, Ser: 1.11 mg/dL (ref 0.61–1.24)
GFR calc Af Amer: >60 mL/min (ref >60)
GLUCOSE: 138 mg/dL — AB (ref 65–99)
Potassium: 3.9 mmol/L (ref 3.5–5.1)
SODIUM: 136 mmol/L (ref 135–145)
Total Protein: 6.3 g/dL — ABNORMAL LOW (ref 6.5–8.1)

## 2014-11-30 LAB — CBC
HEMATOCRIT: 43.6 % (ref 39.0–52.0)
Hemoglobin: 14.4 g/dL (ref 13.0–17.0)
MCH: 30.8 pg (ref 26.0–34.0)
MCHC: 33 g/dL (ref 30.0–36.0)
MCV: 93.4 fL (ref 78.0–100.0)
PLATELETS: 209 10*3/uL (ref 150–400)
RBC: 4.67 MIL/uL (ref 4.22–5.81)
RDW: 13.8 % (ref 11.5–15.5)
WBC: 5.1 10*3/uL (ref 4.0–10.5)

## 2014-11-30 LAB — RAPID URINE DRUG SCREEN, HOSP PERFORMED
Amphetamines: NOT DETECTED
BARBITURATES: NOT DETECTED
Benzodiazepines: NOT DETECTED
Cocaine: NOT DETECTED
OPIATES: NOT DETECTED
TETRAHYDROCANNABINOL: NOT DETECTED

## 2014-11-30 LAB — HEPARIN LEVEL (UNFRACTIONATED): Heparin Unfractionated: 0.31 IU/mL (ref 0.30–0.70)

## 2014-11-30 LAB — LACTIC ACID, PLASMA
Lactic Acid, Venous: 1.9 mmol/L (ref 0.5–2.0)
Lactic Acid, Venous: 1.6 mmol/L (ref 0.5–2.0)

## 2014-11-30 LAB — PROCALCITONIN: Procalcitonin: <0.1 ng/mL

## 2014-11-30 LAB — BRAIN NATRIURETIC PEPTIDE: B NATRIURETIC PEPTIDE 5: 2.7 pg/mL (ref 0.0–100.0)

## 2014-11-30 MED ORDER — HYDROCERIN EX CREA
TOPICAL_CREAM | Freq: Every day | CUTANEOUS | Status: DC
  Administered 2014-11-30 – 2014-12-01 (×2): via TOPICAL
  Administered 2014-12-02: 1 via TOPICAL
  Filled 2014-11-30: qty 113

## 2014-11-30 NOTE — Plan of Care (Signed)
Problem: Consults Goal: Diagnosis - Venous Thromboembolism (VTE) Choose a selection  DVT (Deep Vein Thrombosis)     

## 2014-11-30 NOTE — Consult Note (Signed)
WOC wound consult note Reason for Consult: Consult requested for bilat leg cellulitis.  Wound type: No open wounds or drainage at this time.  Previous edema appears to be receeding, according to patient. Bilat legs with dry scaly skin.  Right leg more swollen and tender than left.  Pt is on systemic IV antibiotic coverage, which is the treatment of choice for cellulitis.  No other topical treatment is indicated at this time.  Eucerin cream to treat dry peeling skin.  Discussed plan of care with patient and he verbalizes understanding. Please re-consult if further assistance is needed.  Thank-you,  Julien Girt MSN, [place], [place], [place], [place]

## 2014-11-30 NOTE — Progress Notes (Addendum)
Echocardiogram 2D Echocardiogram has been performed.  [name] [name] 11/30/2014, 9:50 AM

## 2014-11-30 NOTE — Progress Notes (Signed)
ANTICOAGULATION CONSULT NOTE - Follow Up Consult  Pharmacy Consult for Heparin Indication: pulmonary embolus / DVT history  Allergies  Allergen Reactions  . Xarelto [Rivaroxaban] Dermatitis    Blisters skin  . Clindamycin/Lincomycin Dermatitis    Severe rash/dermatitis from November 2015 still present Jan 2016 from clinda   Assessment: 49 year old male on Coumadin prior to admission for DVT and PE history.  INR sub-therapeutic on admission, now on heparin bridge.  HL therapeutic at 0.31, CBC stable  Goal of Therapy:  Heparin level 0.3-0.7 units/ml Monitor platelets by anticoagulation protocol: Yes   Plan:  Increase heparin to 1500 units / hr Follow up AM labs  Resume Coumadin?       Vital Signs: Temp: 97.9 F (36.6 C) (05/23 0547) Temp Source: Oral (05/23 0547) BP: 119/88 mmHg (05/23 0547) Pulse Rate: 83 (05/23 0547)  Labs:  Recent Labs  11/29/14 1801 11/29/14 1839 11/30/14 0118 11/30/14 0705  HGB 15.9  --  14.4  --   HCT 47.7  --  43.6  --   PLT 235  --  209  --   LABPROT  --  17.8*  --   --   INR  --  1.45  --   --   HEPARINUNFRC  --   --   --  0.31  CREATININE 1.04  --  1.11  --     Estimated Creatinine Clearance: 110.2 mL/min (by C-G formula based on Cr of 1.11).    Thank you. Anette Guarneri, PharmD [phone]  11/30/2014,11:42 AM

## 2014-11-30 NOTE — Progress Notes (Signed)
[facility]                                                                              Patient Demographics  [name] [name], is a 49 y.o. male, DOB - September 28, 1965, IZT:[id number]  Admit date - 11/29/2014   Admitting Physician Sid Falcon, MD  Outpatient Primary MD for the patient is Dellia Nims, MD  LOS - 1   Chief Complaint  Patient presents with  . Leg Pain       Brief HPI   Patient is a 49 year old male with right leg DVT (on Coumadin), smoking, asthma, GERD, bipolar disorder, GERD, depression, who presented with right leg swelling, redness and tenderness. 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 He also has recurrent cellulitis over right leg. He was treated with antibiotics in the past. Patient noted that he has been doing fine until 4 days prior to admission when he started having worsening leg pain and swelling and warmth over the right leg. He is currently on Coumadin for DVT treatment, reports that he missed the one dosing last week. Patient used to take xarelto for DVT, which was discontinued due to allergic rashes. He chooses not to have any narcotic pain medication as he is recovering cocaine and alcohol abuser. He was seen in the ED 4 days ago and given a course of Lasix and antibiotics which he had finished however no significant improvement in the right lower extremity pain and swelling. Patient reported that he had stopped smoking, but restarted smoking recently and smokes 6-7 cigarettes per day now. He has mild dry cough due to smoking, but no chest pain or shortness breath. In ED, patient was found to have WBC 4.7, tachycardia, temperature normal, electrolytes okay, renal function okay. Lower extremity Doppler showed chronic DVT, no new DVT found. Patient was admitted to inpatient for further evaluation and treatment. Of note, patient is followed by internal medicine residency clinic, last seen on 11/19/14 for the same complaint. He has a follow-up appointment on  12/17/14.   Assessment & Plan    Principal Problem:   Cellulitis of right leg - Recurrent issue with outpatient failure to treatment, hemodynamically stable - Continue IV vancomycin and Zosyn, wound care consulted - 2-D echo showed EF of 50- 55% with grade 1 diastolic dysfunction   Active Problems:   Asthma, chronic - Currently stable, continue albuterol nebs as needed  DVT with history of PE - Continue Coumadin per pharmacy, patient placed on heparin bridge until INR above 2  Nicotine abuse Patient counseled on smoking cessation, placed on nicotine patch  Depression Currently stable, no suicidal or homicidal ideation Continue Cymbalta, Seroquel   Code Status: Full code  Family Communication: Discussed in detail with the patient, all imaging results, lab results explained to the patient   Disposition Plan:  Home once cellulitis is improved. D/w Dr Beryle Beams, patient has been accepted by [facility], Dr. Gilles Chiquito will assume care in am (5/24)  Time Spent in minutes  25 minutes  Procedures  2D echo  Consults   Wound care  DVT Prophylaxis   heparin + coumadin  Medications  Scheduled Meds: . Chlorhexidine Gluconate Cloth  6 each Topical Q0600  . clobetasol cream  1 application Topical Daily  . DULoxetine  60 mg Oral Daily  . gabapentin  200 mg Oral TID  . hydrocerin   Topical Daily  . lidocaine  1 application Topical Daily  . mupirocin ointment  1 application Nasal BID  . nicotine  21 mg Transdermal Daily  . piperacillin-tazobactam (ZOSYN)  IV  3.375 g Intravenous 3 times per day  . sodium chloride  3 mL Intravenous Q12H  . vancomycin  1,000 mg Intravenous Q8H   Continuous Infusions: . heparin 1,500 Units/hr (11/30/14 1255)   PRN Meds:.acetaminophen, albuterol, alum & mag hydroxide-simeth, doxepin, guaiFENesin-dextromethorphan, hydrOXYzine, mineral oil-hydrophilic petrolatum, ondansetron **OR** ondansetron (ZOFRAN) IV,  traZODone   Antibiotics   Anti-infectives    Start     Dose/Rate Route Frequency Ordered Stop   11/30/14 0800  vancomycin (VANCOCIN) IVPB 1000 mg/200 mL premix     1,000 mg 200 mL/hr over 60 Minutes Intravenous Every 8 hours 11/29/14 2300     11/30/14 0600  piperacillin-tazobactam (ZOSYN) IVPB 3.375 g  Status:  Discontinued     3.375 g 12.5 mL/hr over 240 Minutes Intravenous 3 times per day 11/29/14 2238 11/29/14 2240   11/30/14 0400  vancomycin (VANCOCIN) 1,500 mg in sodium chloride 0.9 % 500 mL IVPB  Status:  Discontinued     1,500 mg 250 mL/hr over 120 Minutes Intravenous Every 12 hours 11/29/14 2238 11/29/14 2239   11/29/14 2330  piperacillin-tazobactam (ZOSYN) IVPB 3.375 g     3.375 g 12.5 mL/hr over 240 Minutes Intravenous 3 times per day 11/29/14 2300     11/29/14 2330  vancomycin (VANCOCIN) IVPB 1000 mg/200 mL premix     1,000 mg 200 mL/hr over 60 Minutes Intravenous NOW 11/29/14 2300 11/30/14 0113   11/29/14 2000  vancomycin (VANCOCIN) IVPB 1000 mg/200 mL premix     1,000 mg 200 mL/hr over 60 Minutes Intravenous  Once 11/29/14 1952 11/29/14 2107        Subjective:   [name] [name] was seen and examined today. Feels better. Patient denies dizziness, chest pain, shortness of breath, abdominal pain, N/V/D/C, new weakness, numbess, tingling. No acute events overnight.    Objective:   Blood pressure 111/76, pulse 84, temperature 98.3 F (36.8 C), temperature source Oral, resp. rate 18, height 6\' 4"  (1.93 m), weight 109 kg (240 lb 4.8 oz), SpO2 96 %.  Wt Readings from Last 3 Encounters:  11/30/14 109 kg (240 lb 4.8 oz)  11/19/14 115.667 kg (255 lb)  11/09/14 110.3 kg (243 lb 2.7 oz)     Intake/Output Summary (Last 24 hours) at 11/30/14 1413 Last data filed at 11/30/14 0900  Gross per 24 hour  Intake    120 ml  Output    400 ml  Net   -280 ml    Exam  General: Alert and oriented x 3, NAD  HEENT:  PERRLA, EOMI, Anicteic Sclera  Neck: Supple, no JVD, no  masses  CVS: S1 S2 auscultated, 2/6 SM, Regular rate and rhythm.  Respiratory: Clear to auscultation bilaterally, no wheezing, rales or rhonchi  Abdomen: Soft, nontender, nondistended, + bowel sounds  Ext: no cyanosis clubbing right lower exam due to plus edema with erythema in this distribution from foot to knee  Neuro: AAOx3, Cr N's II- XII. Strength 5/5 upper and lower extremities bilaterally  Skin:  erythema on the right leg from foot to knee, skin tears over the both  legs  Psych: Normal affect and demeanor, alert and oriented x3    Data Review   Micro Results No results found for this or any previous visit (from the past 240 hour(s)).  Radiology Reports US Venous Img Lower Unilateral Right  11/06/2014   ADDENDUM REPORT: 11/06/2014 16:03  ADDENDUM: Study discussed by telephone with Dr. Carmin Muskrat on 11/06/2014 at 1557 hours.  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 Therefore, no acute DVT is suspected today.   Electronically Signed   By: Genevie Ann M.D.   On: 11/06/2014 16:03   11/06/2014   CLINICAL DATA:  49 year old male with chronic right lower extremity swelling for 6 months. Chronic DVT, switched from xaralto to Coumadin. Subsequent encounter.  EXAM: RIGHT LOWER EXTREMITY VENOUS DOPPLER ULTRASOUND  TECHNIQUE: Gray-scale sonography with graded compression, as well as color Doppler and duplex ultrasound were performed to evaluate the lower extremity deep venous systems from the level of the common femoral vein and including the common femoral, femoral, profunda femoral, popliteal and calf veins including the posterior tibial, peroneal and gastrocnemius veins when visible. The superficial great saphenous vein was also interrogated. Spectral Doppler was utilized to evaluate flow at rest and with distal augmentation maneuvers in the common femoral, femoral and popliteal veins.  COMPARISON:   Right lower extremity MRI 07/23/2014.  FINDINGS: Contralateral Common Femoral Vein: Respiratory phasicity is normal and symmetric with the symptomatic side. No evidence of thrombus. Normal compressibility.  Common Femoral Vein: No evidence of thrombus. Normal compressibility, respiratory phasicity and response to augmentation.  Saphenofemoral Junction: No evidence of thrombus. Normal compressibility and flow on color Doppler imaging.  Profunda Femoral Vein: No evidence of thrombus. Normal compressibility and flow on color Doppler imaging.  Femoral Vein: Partial or majority thrombosis with abnormal echogenic material (image 23). The vessel is partially compressible.  Popliteal Vein: Partial thrombosis with echogenic material. Better preserved flow on color Doppler than in the femoral vein.  Calf Veins: No evidence of thrombus. Normal compressibility and flow on color Doppler imaging.  Superficial Great Saphenous Vein: No evidence of thrombus. Normal compressibility and flow on color Doppler imaging.  Venous Reflux:  None.  Other Findings: Prominent right groin lymph nodes (image 16), but with preserved normal fatty hila. Extensive subcutaneous edema in the calf and ankle.  IMPRESSION: 1. Partial thrombosis of both the right femoral vein and popliteal vein. Favor chronic DVT in this setting. 2. No other deep or superficial venous thrombosis identified in the right lower extremity. 3. Extensive subcutaneous edema in the calf and ankle. 4. Reactive appearing right groin lymph nodes.  Electronically Signed: By: Genevie Ann M.D. On: 11/06/2014 15:52    CBC  Recent Labs Lab 11/26/14 2015 11/29/14 1801 11/30/14 0118  WBC 7.0 4.7 5.1  HGB 15.5 15.9 14.4  HCT 46.5 47.7 43.6  PLT 216 235 209  MCV 93.4 92.6 93.4  MCH 31.1 30.9 30.8  MCHC 33.3 33.3 33.0  RDW 13.9 13.7 13.8  LYMPHSABS 1.2 1.4  --   MONOABS 0.6 0.5  --   EOSABS 0.2 0.2  --   BASOSABS 0.0 0.0  --     Chemistries   Recent Labs Lab  11/26/14 2015 11/29/14 1801 11/30/14 0118  NA 139 137 136  K 4.5 4.0 3.9  CL 104 101 99*  CO2 26 27 27   GLUCOSE 103* 91 138*  BUN 10 13  11  CREATININE 1.03 1.04 1.11  CALCIUM 9.6 9.6 8.8*  AST  --  23 24  ALT  --  39 38  ALKPHOS  --  119 77  BILITOT  --  0.6 0.5   ------------------------------------------------------------------------------------------------------------------ estimated creatinine clearance is 110.2 mL/min (by C-G formula based on Cr of 1.11). ------------------------------------------------------------------------------------------------------------------ No results for input(s): HGBA1C in the last 72 hours. ------------------------------------------------------------------------------------------------------------------ No results for input(s): CHOL, HDL, LDLCALC, TRIG, CHOLHDL, LDLDIRECT in the last 72 hours. ------------------------------------------------------------------------------------------------------------------ No results for input(s): TSH, T4TOTAL, T3FREE, THYROIDAB in the last 72 hours.  Invalid input(s): FREET3 ------------------------------------------------------------------------------------------------------------------ No results for input(s): VITAMINB12, FOLATE, FERRITIN, TIBC, IRON, RETICCTPCT in the last 72 hours.  Coagulation profile  Recent Labs Lab 11/29/14 1839  INR 1.45    No results for input(s): DDIMER in the last 72 hours.  Cardiac Enzymes No results for input(s): CKMB, TROPONINI, MYOGLOBIN in the last 168 hours.  Invalid input(s): CK ------------------------------------------------------------------------------------------------------------------ Invalid input(s): POCBNP  No results for input(s): GLUCAP in the last 72 hours.   [name] [name] M.D. [facility] 11/30/2014, 2:13 PM  Pager: [phone]   Between 7am to 7pm - call Pager - [phone]  After 7pm go to [email or web address] - password [email or web address]  Call night  coverage person covering after 7pm

## 2014-12-01 ENCOUNTER — Encounter (HOSPITAL_COMMUNITY): Payer: Self-pay | Admitting: Internal Medicine

## 2014-12-01 DIAGNOSIS — I878 Other specified disorders of veins: Secondary | ICD-10-CM

## 2014-12-01 DIAGNOSIS — Z86711 Personal history of pulmonary embolism: Secondary | ICD-10-CM

## 2014-12-01 DIAGNOSIS — J45909 Unspecified asthma, uncomplicated: Secondary | ICD-10-CM

## 2014-12-01 DIAGNOSIS — F1421 Cocaine dependence, in remission: Secondary | ICD-10-CM

## 2014-12-01 DIAGNOSIS — Z7901 Long term (current) use of anticoagulants: Secondary | ICD-10-CM

## 2014-12-01 DIAGNOSIS — G4700 Insomnia, unspecified: Secondary | ICD-10-CM

## 2014-12-01 DIAGNOSIS — L309 Dermatitis, unspecified: Secondary | ICD-10-CM

## 2014-12-01 DIAGNOSIS — F17210 Nicotine dependence, cigarettes, uncomplicated: Secondary | ICD-10-CM

## 2014-12-01 DIAGNOSIS — F1221 Cannabis dependence, in remission: Secondary | ICD-10-CM

## 2014-12-01 DIAGNOSIS — F419 Anxiety disorder, unspecified: Secondary | ICD-10-CM

## 2014-12-01 DIAGNOSIS — I872 Venous insufficiency (chronic) (peripheral): Secondary | ICD-10-CM | POA: Diagnosis present

## 2014-12-01 DIAGNOSIS — Z22322 Carrier or suspected carrier of Methicillin resistant Staphylococcus aureus: Secondary | ICD-10-CM

## 2014-12-01 DIAGNOSIS — F339 Major depressive disorder, recurrent, unspecified: Secondary | ICD-10-CM

## 2014-12-01 DIAGNOSIS — I82503 Chronic embolism and thrombosis of unspecified deep veins of lower extremity, bilateral: Secondary | ICD-10-CM

## 2014-12-01 LAB — HEPARIN LEVEL (UNFRACTIONATED)
HEPARIN UNFRACTIONATED: 0.17 [IU]/mL — AB (ref 0.30–0.70)
Heparin Unfractionated: 0.26 IU/mL — ABNORMAL LOW (ref 0.30–0.70)

## 2014-12-01 LAB — CBC
HCT: 44.2 % (ref 39.0–52.0)
Hemoglobin: 14.6 g/dL (ref 13.0–17.0)
MCH: 30.5 pg (ref 26.0–34.0)
MCHC: 33 g/dL (ref 30.0–36.0)
MCV: 92.3 fL (ref 78.0–100.0)
Platelets: 208 10*3/uL (ref 150–400)
RBC: 4.79 MIL/uL (ref 4.22–5.81)
RDW: 13.6 % (ref 11.5–15.5)
WBC: 4.9 10*3/uL (ref 4.0–10.5)

## 2014-12-01 MED ORDER — HEPARIN (PORCINE) IN NACL 100-0.45 UNIT/ML-% IJ SOLN
1900.0000 [IU]/h | INTRAMUSCULAR | Status: DC
  Administered 2014-12-01: 2000 [IU]/h via INTRAVENOUS
  Administered 2014-12-02: 1900 [IU]/h via INTRAVENOUS
  Administered 2014-12-02: 2000 [IU]/h via INTRAVENOUS
  Filled 2014-12-01 (×3): qty 250

## 2014-12-01 MED ORDER — WARFARIN SODIUM 2.5 MG PO TABS
12.5000 mg | ORAL_TABLET | Freq: Once | ORAL | Status: AC
  Administered 2014-12-01: 12.5 mg via ORAL
  Filled 2014-12-01: qty 1

## 2014-12-01 MED ORDER — DOXYCYCLINE HYCLATE 100 MG PO TABS
100.0000 mg | ORAL_TABLET | Freq: Two times a day (BID) | ORAL | Status: DC
  Administered 2014-12-01 – 2014-12-02 (×3): 100 mg via ORAL
  Filled 2014-12-01 (×4): qty 1

## 2014-12-01 MED ORDER — WARFARIN - PHARMACIST DOSING INPATIENT: Freq: Every day | Status: DC

## 2014-12-01 NOTE — Progress Notes (Addendum)
ANTICOAGULATION CONSULT NOTE - Follow Up Consult  Pharmacy Consult for Heparin / Coumadin Indication: pulmonary embolus / DVT history  Allergies  Allergen Reactions  . Xarelto [Rivaroxaban] Dermatitis    Blisters skin  . Clindamycin/Lincomycin Dermatitis    Severe rash/dermatitis from November 2015 still present Jan 2016 from clinda   Assessment: 49 year old male on Coumadin prior to admission for DVT and PE history.  INR sub-therapeutic on admission, now on heparin bridge.   HL low at 0.17 Restarting Coumadin today  Goal of Therapy:  Heparin level 0.3-0.7 units/ml Monitor platelets by anticoagulation protocol: Yes   Plan:  Increase heparin to 1800 units / hr Coumadin 12.5 mg po x 1 6 hour heparin level Follow up AM labs         Vital Signs: Temp: 98 F (36.7 C) (05/24 0532) Temp Source: Oral (05/24 0532) BP: 103/71 mmHg (05/24 0532) Pulse Rate: 71 (05/24 0532)  Labs:  Recent Labs  11/29/14 1801 11/29/14 1839 11/30/14 0118 11/30/14 0705 12/01/14 0720 12/01/14 0912  HGB 15.9  --  14.4  --  14.6  --   HCT 47.7  --  43.6  --  44.2  --   PLT 235  --  209  --  208  --   LABPROT  --  17.8*  --   --   --   --   INR  --  1.45  --   --   --   --   HEPARINUNFRC  --   --   --  0.31  --  0.17*  CREATININE 1.04  --  1.11  --   --   --     Estimated Creatinine Clearance: 109.9 mL/min (by C-G formula based on Cr of 1.11).    Thank you. Anette Guarneri, PharmD [phone]  12/01/2014,11:59 AM  Addendum: Heparin level has increased to 0.26 but still below goal. Increase to 2000 units/hr and follow up AM labs.  Nena Jordan, PharmD, BCPS 12/01/2014, 10:04 PM

## 2014-12-01 NOTE — Consult Note (Signed)
WOC consult requested for [facility] boot to RLE.  Pt is familiar to [facility] team from previous assessment on 5/23.  Primary team is requesting Ardelia Mems boot be applied to RLE.  Ortho tech perfoms this procedure at [facility], paged to inform them of need for application and changing Q Tues and Sat while in the hospital.  Pt will need home health assistance for [facility] boot changes after discharge; please order if this is the desired plan of care. Please re-consult if further assistance is needed.  Thank-you,  Julien Girt MSN, [place], [place], [place], [place]

## 2014-12-01 NOTE — Progress Notes (Signed)
Utilization Review completed. Drake Wuertz RN BSN CM 

## 2014-12-01 NOTE — Progress Notes (Signed)
Orthopedic Tech Progress Note Patient Details:  RYLON POITRA 1966-07-09 [id number]  Ortho Devices Type of Ortho Device: Louretta Parma boot Ortho Device/Splint Location: rle Ortho Device/Splint Interventions: Application   Jelicia Nantz 12/01/2014, 3:21 PM
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
## 2014-12-02 DIAGNOSIS — B9689 Other specified bacterial agents as the cause of diseases classified elsewhere: Secondary | ICD-10-CM

## 2014-12-02 DIAGNOSIS — L03115 Cellulitis of right lower limb: Principal | ICD-10-CM

## 2014-12-02 DIAGNOSIS — I82501 Chronic embolism and thrombosis of unspecified deep veins of right lower extremity: Secondary | ICD-10-CM

## 2014-12-02 LAB — BASIC METABOLIC PANEL
Anion gap: 8 (ref 5–15)
BUN: 7 mg/dL (ref 6–20)
CALCIUM: 9.5 mg/dL (ref 8.9–10.3)
CHLORIDE: 104 mmol/L (ref 101–111)
CO2: 26 mmol/L (ref 22–32)
Creatinine, Ser: 0.99 mg/dL (ref 0.61–1.24)
GFR calc non Af Amer: >60 mL/min (ref >60)
Glucose, Bld: 87 mg/dL (ref 65–99)
Potassium: 4 mmol/L (ref 3.5–5.1)
SODIUM: 138 mmol/L (ref 135–145)

## 2014-12-02 LAB — HEPARIN LEVEL (UNFRACTIONATED): Heparin Unfractionated: 0.88 IU/mL — ABNORMAL HIGH (ref 0.30–0.70)

## 2014-12-02 LAB — PROTIME-INR
INR: 1.32 (ref 0.00–1.49)
Prothrombin Time: 16.5 seconds — ABNORMAL HIGH (ref 11.6–15.2)

## 2014-12-02 MED ORDER — HYDROCERIN EX CREA: 1.0000 "application " | TOPICAL_CREAM | Freq: Every day | CUTANEOUS | Status: DC

## 2014-12-02 MED ORDER — DOXYCYCLINE HYCLATE 100 MG PO TABS: 100.0000 mg | ORAL_TABLET | Freq: Two times a day (BID) | ORAL | Status: DC

## 2014-12-02 MED ORDER — MUPIROCIN CALCIUM 2 % NA OINT: TOPICAL_OINTMENT | NASAL | Status: DC

## 2014-12-02 MED ORDER — WARFARIN SODIUM 2.5 MG PO TABS
12.5000 mg | ORAL_TABLET | Freq: Once | ORAL | Status: DC
  Filled 2014-12-02: qty 1

## 2014-12-02 MED ORDER — ENOXAPARIN SODIUM 150 MG/ML ~~LOC~~ SOLN: 150.0000 mg | SUBCUTANEOUS | Status: DC

## 2014-12-02 NOTE — Discharge Summary (Signed)
Name: [name] [name] MRN: [id number] DOB: 1966-02-08 49 y.o. PCP: Dellia Nims, MD  Date of Admission: 11/29/2014  6:14 PM Date of Discharge: 12/02/2014 Attending Physician: Sid Falcon, MD  Discharge Diagnosis:  Chronic venous stasis complicated by chronic DVT of right lower extremity with superimposed cellulitis  Diastolic heart failure  Eczema  Polysubstance abuse  Major depressive disorder  Chronic asthma   Discharge Medications:   Medication List    STOP taking these medications        cephALEXin 500 MG capsule  Commonly known as:  KEFLEX     doxycycline 100 MG capsule  Commonly known as:  VIBRAMYCIN  Replaced by:  doxycycline 100 MG tablet      TAKE these medications        acetaminophen 500 MG tablet  Commonly known as:  TYLENOL  Take 1,000 mg by mouth every 8 (eight) hours as needed (pain).     albuterol 108 (90 BASE) MCG/ACT inhaler  Commonly known as:  PROVENTIL HFA;VENTOLIN HFA  Inhale 2 puffs into the lungs every 4 (four) hours as needed for wheezing or shortness of breath.     clobetasol cream 0.05 %  Commonly known as:  TEMOVATE  Apply 1 application topically 2 (two) times daily. Apply to legs/arms/hands twice daily as needed for itching     doxepin 25 MG capsule  Commonly known as:  SINEQUAN  Take 1 capsule (25 mg total) by mouth at bedtime as needed (sleep).     doxycycline 100 MG tablet  Commonly known as:  VIBRA-TABS  Take 1 tablet (100 mg total) by mouth every 12 (twelve) hours.     DULoxetine 60 MG capsule  Commonly known as:  CYMBALTA  Take 1 capsule (60 mg total) by mouth daily.     enoxaparin 150 MG/ML injection  Commonly known as:  LOVENOX  Inject 1 mL (150 mg total) into the skin daily.     eucerin cream  Apply 1 application topically 2 (two) times daily as needed for dry skin (skin irritation).     hydrocerin Crea  Apply 1 application topically daily.     furosemide 20 MG tablet  Commonly known as:  LASIX  Take 1  tablet (20 mg total) by mouth daily.     gabapentin 100 MG capsule  Commonly known as:  NEURONTIN  Take 2 capsules (200 mg total) by mouth 3 (three) times daily.     hydrOXYzine 25 MG tablet  Commonly known as:  ATARAX/VISTARIL  Take 1 tablet (25 mg total) by mouth 3 (three) times daily as needed for itching.     lidocaine 5 % ointment  Commonly known as:  XYLOCAINE  Apply 1 application topically daily.     mineral oil-hydrophilic petrolatum ointment  Apply 1 application topically 3 (three) times daily as needed for dry skin or irritation.     mupirocin nasal ointment 2 %  Commonly known as:  BACTROBAN  Use one-half of tube in each nostril twice daily for five (5) days. After application, press sides of nose together and gently massage.     traZODone 100 MG tablet  Commonly known as:  DESYREL  Take 1 tablet (100 mg total) by mouth at bedtime as needed for sleep.     warfarin 5 MG tablet  Commonly known as:  COUMADIN  Take 2 1/2 tablets (12.5 mg) on Mon and Fri, take 2 tablets (10 mg) on Sun, Tue, Wed, Thur, Sat  Disposition and follow-up:   Mr.Aaiden A Meulemans was discharged from [facility] in Stable condition.  At the hospital follow up visit please address:  Recheck INR to adjust warfarin dosing  Cellulitis: Resolution of symptoms, completion of therapy  Polysubstance abuse: tobacco, alcohol   Follow-up Appointments: Follow-up Information    Follow up with Flossie Dibble. Go on 12/04/2014.   Why:  2 PM   Contact information:   [place], [place] [address] [phone]      Discharge Instructions:   Consultations:    Procedures Performed:  US Venous Img Lower Unilateral Right  11/06/2014   ADDENDUM REPORT: 11/06/2014 16:03  ADDENDUM: Study discussed by telephone with Dr. Carmin Muskrat on 11/06/2014 at 1557 hours.  In [facility] Epic system he & I find a vascular lab report from 07/24/2014 indicating chronic DVT in the right  femoral and popliteal veins. Therefore, no acute DVT is suspected today.   Electronically Signed   By: Genevie Ann M.D.   On: 11/06/2014 16:03   11/06/2014   CLINICAL DATA:  49 year old male with chronic right lower extremity swelling for 6 months. Chronic DVT, switched from xaralto to Coumadin. Subsequent encounter.  EXAM: RIGHT LOWER EXTREMITY VENOUS DOPPLER ULTRASOUND  TECHNIQUE: Gray-scale sonography with graded compression, as well as color Doppler and duplex ultrasound were performed to evaluate the lower extremity deep venous systems from the level of the common femoral vein and including the common femoral, femoral, profunda femoral, popliteal and calf veins including the posterior tibial, peroneal and gastrocnemius veins when visible. The superficial great saphenous vein was also interrogated. Spectral Doppler was utilized to evaluate flow at rest and with distal augmentation maneuvers in the common femoral, femoral and popliteal veins.  COMPARISON:  Right lower extremity MRI 07/23/2014.  FINDINGS: Contralateral Common Femoral Vein: Respiratory phasicity is normal and symmetric with the symptomatic side. No evidence of thrombus. Normal compressibility.  Common Femoral Vein: No evidence of thrombus. Normal compressibility, respiratory phasicity and response to augmentation.  Saphenofemoral Junction: No evidence of thrombus. Normal compressibility and flow on color Doppler imaging.  Profunda Femoral Vein: No evidence of thrombus. Normal compressibility and flow on color Doppler imaging.  Femoral Vein: Partial or majority thrombosis with abnormal echogenic material (image 23). The vessel is partially compressible.  Popliteal Vein: Partial thrombosis with echogenic material. Better preserved flow on color Doppler than in the femoral vein.  Calf Veins: No evidence of thrombus. Normal compressibility and flow on color Doppler imaging.  Superficial Great Saphenous Vein: No evidence of thrombus. Normal compressibility  and flow on color Doppler imaging.  Venous Reflux:  None.  Other Findings: Prominent right groin lymph nodes (image 16), but with preserved normal fatty hila. Extensive subcutaneous edema in the calf and ankle.  IMPRESSION: 1. Partial thrombosis of both the right femoral vein and popliteal vein. Favor chronic DVT in this setting. 2. No other deep or superficial venous thrombosis identified in the right lower extremity. 3. Extensive subcutaneous edema in the calf and ankle. 4. Reactive appearing right groin lymph nodes.  Electronically Signed: By: Genevie Ann M.D. On: 11/06/2014 15:52    2D Echo: Study Conclusions  - Left ventricle: The cavity size was mildly dilated. Systolic function was normal. The estimated ejection fraction was in the range of 50% to 55%. Wall motion was normal; there were no regional wall motion abnormalities. Doppler parameters are consistent with abnormal left ventricular relaxation (grade 1 diastolic dysfunction). There was no evidence of elevated  ventricular filling pressure by Doppler parameters. - Aortic valve: There was trivial regurgitation. - Mitral valve: Structurally normal valve. There was no regurgitation. - Right ventricle: The cavity size was normal. Wall thickness was normal. Systolic function was normal. - Right atrium: The atrium was normal in size. - Tricuspid valve: There was mild regurgitation. - Pulmonic valve: There was no regurgitation. - Pulmonary arteries: Systolic pressure was within the normal range. - Inferior vena cava: The vessel was normal in size. - Pericardium, extracardiac: There was no pericardial effusion.  Admission HPI: JAYDENN BOCCIO is a 49 y.o. male with PMH of right leg DVT (on Coumadin), smoking, asthma, GERD, bipolar disorder, GERD, depression, who presents with right leg swelling, redness and tenderness.  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 He also has recurrent cellulitis over right leg. He was treated with  antibiotics in the past. He has been doing okay until 4 days ago when he started having worsening leg pain and swelling. He feels warm over right leg. He is currently on Coumadin for DVT treatment, reports that he missed the one dosing last week. Patient used to take xarelto for DVT, which was discontinue due to allergic rashes. He is currently on Coumadin. He's had multiple evaluations of this leg pain in the past. He chooses not to have any narcotic pain medication as he is recovering cocaine and alcohol abuser. He was seen in the ED 4 days ago and given a course of Lasix and antibiotics which she has finished. He feels the pain swelling are worse. Patient reports that he stopped smoking, but restarted smoking recently. He smokes 6-7 cigarettes per day now. He has mild dry cough due to smoking, but no chest pain or shortness breath.  Currently patient denies fever, chills, running nose, ear pain, headaches, abdominal pain, diarrhea, constipation, dysuria, urgency, frequency, hematuria, skin rashes. No unilateral weakness, numbness or tingling sensations. No vision change or hearing loss.  In ED, patient was found to have WBC 4.7, tachycardia, temperature normal, electrolytes okay, renal function okay. Lower extremity Doppler showed chronic DVT, no new DVT found. Patient is admitted to inpatient for further evaluation and treatment.  Hospital Course by problem list:   Chronic venous stasis complicated by chronic DVT of right lower extremity with superimposed cellulitis: Given initial concern for cellulitis, he was started on azithromycin and vancomycin on 5/22 and transitioned to Doxycycline on 05/24. Mr. Gappa was discharged with 4 day prescription for Doxy 100mg  BID and will complete his 7 day antibiotic course on Dec 06, 2014. Wound care recommended fitting of Unna boot overlying right lower extremity, and case management was instrumental in procuring supplies for the patient at the time of discharge.  Mr. Broner also continued to use his home medications of Eucerin cream, clobetasol and aquaphor for pain and itching. He will need follow-up with dermatology.  Chronic anticoagulation: On admission, venous duplex on 05/22 showed intermediate age DVT right femoral vein and popliteal veinedema in calf and ankle.  INR was up therapeutic at 1.45, so he was bridged with heparin IV and continued on home Coumadin regimen. He will be discharged on Lovenox 150 mg daily for bridging with the assistance of care management and should have INR rechecked at the time of follow-up.  Diastolic heart failure: Echo on 05/23 showed mildly dilated LV w/ normal systolic function: EF 76-28% but Grade 1 diastolic dysfunction.   Eczema: He was continued on his home medications. He is scheduled to have follow-up with Dr. Nancy Fetter [Dermatology]  Polysubstance abuse: UDS on 05/23 was negative. Mr. Stief received a nicotine patch 21 mg during his stay. He was counseled on smoking cessation by the team and acknowledged the effects on his chronic DVT. [facility]'ll be [facility].   Major depressive disorder/bipolar 1 disorder: Patient was screened for depression based on the PHQ-9 when he was transferred to the Internal Medicine Teaching service and was at low risk for depression. Patient continued his home medications of Cymbalta 60 mg qd, Trazadone 100 mg qhs prn, Doxepin 25 mg qhs sleep, Gabapentin 200 mg tid during his stay and had no medication changes on discharge.   Asthma: Shortness of breath and asthma was not an active issue during his hospital stay. Patient continued his home medication of albuterol as needed and was discharged without any medication changes.   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  Discharge Vitals:   BP 125/69 mmHg  Pulse 75  Temp(Src) 98.6 F (37 C) (Oral)  Resp 20  Ht 6\' 4"   (1.93 m)  Wt 239 lb 1.6 oz (108.455 kg)  BMI 29.12 kg/m2  SpO2 99%  Discharge Labs:  Results for orders placed or performed during the hospital encounter of 11/29/14 (from the past 24 hour(s))  Heparin level (unfractionated)     Status: Abnormal   Collection Time: 12/01/14  8:40 PM  Result Value Ref Range   Heparin Unfractionated 0.26 (L) 0.30 - 0.70 IU/mL  Heparin level (unfractionated)     Status: Abnormal   Collection Time: 12/02/14  7:40 AM  Result Value Ref Range   Heparin Unfractionated 0.88 (H) 0.30 - 0.70 IU/mL  Basic metabolic panel     Status: None   Collection Time: 12/02/14  7:40 AM  Result Value Ref Range   Sodium 138 135 - 145 mmol/L   Potassium 4.0 3.5 - 5.1 mmol/L   Chloride 104 101 - 111 mmol/L   CO2 26 22 - 32 mmol/L   Glucose, Bld 87 65 - 99 mg/dL   BUN 7 6 - 20 mg/dL   Creatinine, Ser 0.99 0.61 - 1.24 mg/dL   Calcium 9.5 8.9 - 10.3 mg/dL   GFR calc non Af Amer >60 >60 mL/min   GFR calc Af Amer >60 >60 mL/min   Anion gap 8 5 - 15  Protime-INR     Status: Abnormal   Collection Time: 12/02/14  7:40 AM  Result Value Ref Range   Prothrombin Time 16.5 (H) 11.6 - 15.2 seconds   INR 1.32 0.00 - 1.49    Signed: Riccardo Dubin, MD 12/02/2014, 3:11 PM    Services Ordered on Discharge: SNF Equipment Ordered on Discharge: Haematologist

## 2014-12-02 NOTE — Progress Notes (Signed)
Subjective: This morning, he denies any complaints. He reports he is comfortable with Lovenox injections and has done them before. He also reports he feels comfortable replacing his Unna boot at home.    Objective: Vital signs in last 24 hours: Filed Vitals:   12/01/14 0532 12/01/14 1549 12/01/14 2106 12/02/14 0530  BP: 103/71 112/76 116/77 105/68  Pulse: 71 76 74 77  Temp: 98 F (36.7 C) 98.6 F (37 C) 98.9 F (37.2 C) 98 F (36.7 C)  TempSrc: Oral Oral Oral Oral  Resp: 18 17 20 18   Height:      Weight:      SpO2: 98% 97% 95% 98%   Weight change:   Intake/Output Summary (Last 24 hours) at 12/02/14 1335 Last data filed at 12/02/14 1032  Gross per 24 hour  Intake  607.8 ml  Output   1400 ml  Net -792.2 ml   General: Young African-American male, resting in bed, NAD HEENT: PERRL, EOMI, no scleral icterus, oropharynx clear Cardiac: RRR, no rubs, murmurs or gallops Pulm: clear to auscultation bilaterally, no wheezes, rales, or rhonchi Abd: soft, nontender, nondistended, BS present Ext: warm and well perfused, right lower extremity covered in Unna boot, left lower extremity with chronic venous stasis skin changes Neuro: responds to questions appropriately; moving all extremities freely  Lab Results: Basic Metabolic Panel:  Recent Labs Lab 11/30/14 0118 12/02/14 0740  NA 136 138  K 3.9 4.0  CL 99* 104  CO2 27 26  GLUCOSE 138* 87  BUN 11 7  CREATININE 1.11 0.99  CALCIUM 8.8* 9.5   Liver Function Tests:  Recent Labs Lab 11/29/14 1801 11/30/14 0118  AST 23 24  ALT 39 38  ALKPHOS 119 77  BILITOT 0.6 0.5  PROT 6.9 6.3*  ALBUMIN 3.8 3.3*   CBC:  Recent Labs Lab 11/26/14 2015 11/29/14 1801 11/30/14 0118 12/01/14 0720  WBC 7.0 4.7 5.1 4.9  NEUTROABS 5.0 2.5  --   --   HGB 15.5 15.9 14.4 14.6  HCT 46.5 47.7 43.6 44.2  MCV 93.4 92.6 93.4 92.3  PLT 216 235 209 208   Coagulation:  Recent Labs Lab 11/29/14 1839 12/02/14 0740  LABPROT 17.8*  16.5*  INR 1.45 1.32   Urine Drug Screen: Drugs of Abuse     Component Value Date/Time   LABOPIA NONE DETECTED 11/30/2014 0040   COCAINSCRNUR NONE DETECTED 11/30/2014 0040   LABBENZ NONE DETECTED 11/30/2014 0040   AMPHETMU NONE DETECTED 11/30/2014 0040   THCU NONE DETECTED 11/30/2014 0040   LABBARB NONE DETECTED 11/30/2014 0040     Micro Results: Recent Results (from the past 240 hour(s))  Culture, blood (routine x 2)     Status: None (Preliminary result)   Collection Time: 11/29/14 11:02 PM  Result Value Ref Range Status   Specimen Description BLOOD LEFT ANTECUBITAL  Final   Special Requests BOTTLES DRAWN AEROBIC ONLY 10CC  Final   Culture   Final           BLOOD CULTURE RECEIVED NO GROWTH TO DATE CULTURE WILL BE HELD FOR 5 DAYS BEFORE ISSUING A FINAL NEGATIVE REPORT Performed at [facility]    Report Status PENDING  Incomplete  Culture, blood (routine x 2)     Status: None (Preliminary result)   Collection Time: 11/29/14 11:06 PM  Result Value Ref Range Status   Specimen Description BLOOD RIGHT ANTECUBITAL  Final   Special Requests BOTTLES DRAWN AEROBIC ONLY 5CC  Final   Culture  Final           BLOOD CULTURE RECEIVED NO GROWTH TO DATE CULTURE WILL BE HELD FOR 5 DAYS BEFORE ISSUING A FINAL NEGATIVE REPORT Performed at [facility]    Report Status PENDING  Incomplete   Studies/Results: No results found. Medications: I have reviewed the patient's current medications. Scheduled Meds: . Chlorhexidine Gluconate Cloth  6 each Topical Q0600  . clobetasol cream  1 application Topical Daily  . doxycycline  100 mg Oral Q12H  . DULoxetine  60 mg Oral Daily  . gabapentin  200 mg Oral TID  . hydrocerin   Topical Daily  . lidocaine  1 application Topical Daily  . mupirocin ointment  1 application Nasal BID  . nicotine  21 mg Transdermal Daily  . sodium chloride  3 mL Intravenous Q12H  . warfarin  12.5 mg Oral ONCE-1800  . Warfarin - Pharmacist Dosing  Inpatient   Does not apply q1800   Continuous Infusions: . heparin 1,900 Units/hr (12/02/14 1026)   PRN Meds:.acetaminophen, albuterol, alum & mag hydroxide-simeth, doxepin, guaiFENesin-dextromethorphan, hydrOXYzine, mineral oil-hydrophilic petrolatum, ondansetron **OR** ondansetron (ZOFRAN) IV, traZODone Assessment/Plan:  Chronic venous stasis complicated by chronic DVT of right lower extremity with superimposed cellulitis: Signs of infection suggestive of improvement. He is tolerating the [employer] well. He has a history of DVT in his right leg along with PE bilaterally and has been on chronic anticoagulation with warfarin. INR today 1.3 which is subtherapeutic for goal 2-3. -Continue skin care with Eucerin cream, Aquaphor -Follow blood culture results -Continue doxycycline 100 mg twice daily [antibiotics day 3/7] -Schedule follow-up in 2 days with Coumadin clinic to reassess INR -Consult care management for the Unna boots and possibility of discount for Lovenox  Diastolic heart failure: Echo this admission notable for EF 50-55% with grade 1 diastolic dysfunction. Certainly contributory to his chronic venous stasis in his lower extremities.  Eczema: As noted and biopsy results from March. Her medications include clobetasol cream. -Continue home medications  Polysubstance abuse: Per chart review, UDS has been notable for cocaine though not on this admission. He also acknowledges smoking cigarettes. -Advised smoking cessation with reassessment at follow-up  Major depressive disorder: Current home medications occlude Cymbalta 60 mg daily, trazodone 100 mg as needed at bedtime, doxepin 25 mg as needed for sleep, gabapentin 200 mg 3 times daily. -Continue home medications  Chronic asthma: Continue albuterol as needed.  Dispo: Disposition is deferred at this time, possibly today.    The patient does have a current PCP (Tasrif Ahmed, MD) and does need an [facility] hospital follow-up appointment  after discharge.  The patient does not know have transportation limitations that hinder transportation to clinic appointments.  .Services Needed at time of discharge: Y = Yes, Blank = No PT:   OT:   RN:   Equipment:   Other:     LOS: 3 days   Riccardo Dubin, MD 12/02/2014, 1:35 PM

## 2014-12-02 NOTE — Plan of Care (Signed)
Problem: Consults Goal: Diagnosis - Venous Thromboembolism (VTE) Choose a selection  Outcome: Adequate for Discharge PE (Pulmonary Embolism)

## 2014-12-02 NOTE — Progress Notes (Signed)
Name: [name] [name] MRN: [id number] Date: 12/02/2014  LOS: 3  Subjective: Pharmacy was consulted to titrate patient's heparin levels. They suggest to increase to 2000 units/hour and follow-up heparin levels in this morning. WOC consult made for an unna boot for [name] [name]'s RLE. Orthropedic tech came and fitted the patient for the boot. Patient says he really likes it.   Objective: Vital signs in last 24 hours: Filed Vitals:   12/01/14 0532 12/01/14 1549 12/01/14 2106 12/02/14 0530  BP: 103/71 112/76 116/77 105/68  Pulse: 71 76 74 77  Temp: 98 F (36.7 C) 98.6 F (37 C) 98.9 F (37.2 C) 98 F (36.7 C)  TempSrc: Oral Oral Oral Oral  Resp: 18 17 20 18   Height:      Weight:      SpO2: 98% 97% 95% 98%    Weight change:  Filed Weights   11/29/14 2244 11/30/14 0547 12/01/14 0500  Weight: 239 lb 10.2 oz (108.7 kg) 240 lb 4.8 oz (109 kg) 239 lb 1.6 oz (108.455 kg)    I/O: I: 480 mL PO,  50mL NS washout q12 O: 1400 mL unmeasured urine, 0 BM  Physical Exam General appearance: [name] [name] was well appearing, alert and cooperative today. He was in no distress. Lungs: clear to auscultation bilaterally Heart: regular rate and rhythm, S1, S2 normal, no murmur, click, rub or gallop Abdomen: soft, mildly distended, normal bowel sounds Extremities: Swelling improved since yesterday: Bilateral distal [name] [name] non-pitting edema. Unable to assess R [name] [name] due to unna boot.  Pulses: faint TP, PD pulses on left [name] [name].  Skin: Dry flaking rash on palms and bilateral distal UE has improved. Left [name] [name] rash: same size. No crepitus or changes in color. Dark, shiny and warm to touch. Right [name] [name] not assessed due to [employer].  Lab Results:  PT/INR: 16.5s, 1.32 Heparin level (unfractionated) x2 on 05/24: 0.26 (L), 0.17 (L) CMP Latest Ref Rng 12/02/2014 11/30/2014 11/29/2014  Glucose 65 - 99 mg/dL 87 138(H) 91  BUN 6 - 20 mg/dL 7 11 13   Creatinine 0.61 - 1.24 mg/dL 0.99 1.11 1.04  Sodium 135 - 145 mmol/L 138 136 137   Potassium 3.5 - 5.1 mmol/L 4.0 3.9 4.0  Chloride 101 - 111 mmol/L 104 99(L) 101  CO2 22 - 32 mmol/L 26 27 27   Calcium 8.9 - 10.3 mg/dL 9.5 8.8(L) 9.6  Total Protein 6.5 - 8.1 g/dL - 6.3(L) 6.9  Total Bilirubin 0.3 - 1.2 mg/dL - 0.5 0.6  Alkaline Phos 38 - 126 U/L - 77 119  AST 15 - 41 U/L - 24 23  ALT 17 - 63 U/L - 38 39   Micro Results: Blood culture x2 (5/22): NGTD  Studies/Results: Echocardiogram: Received 05/23 Conclusions: Left ventricle mildly dilated w/ normal systolic function: EF 17-00%. Grade 1 diastolic dysfunction but no evidence of elevated ventricular filling. Trivial aortic regurgitation. R heart normal.  Medications: I have reviewed the patient's current medications. Scheduled Meds: . Chlorhexidine Gluconate Cloth  6 each Topical Q0600  . clobetasol cream  1 application Topical Daily  . doxycycline  100 mg Oral Q12H  . DULoxetine  60 mg Oral Daily  . gabapentin  200 mg Oral TID  . hydrocerin   Topical Daily  . lidocaine  1 application Topical Daily  . mupirocin ointment  1 application Nasal BID  . nicotine  21 mg Transdermal Daily  . sodium chloride  3 mL Intravenous Q12H  . Warfarin - Pharmacist Dosing Inpatient  Does not apply q1800   Continuous Infusions: . heparin 2,000 Units/hr (12/02/14 0213)   PRN Meds:.acetaminophen, albuterol, alum & mag hydroxide-simeth, doxepin, guaiFENesin-dextromethorphan, hydrOXYzine, mineral oil-hydrophilic petrolatum, ondansetron **OR** ondansetron (ZOFRAN) IV, traZODone  Assessment/Plan:  [name] [name] is a 49 y.o. male with a PMH of recurrent DVT and cellulitis who is on hospital day 3 at [facility] for Right leg cellulitis.  Cellulitis of right leg: [name] [name]. 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 He states that flares last for 2-3 weeks, with 2-3 weeks of relapse. [name] [name] was started on azithromycin and vancomycin on 5/22 but was switched to Doxycycline on 05/24.  At home, [name] [name] uses Eucerin cream, clobetasol and aquaphor for pain and itching. He states that they help.  - Doxy 100mg  BID (ABX day 3/7, Doxy day 1) - BCx x2 (collected 05/22): NGTD - Continue Eucerin, Atarax prn itching, aquaphor, temovate cream for rash, pain and itching.  - Tylenol 1000 tid prn   Chronic venous stasis, chronic DVT (right): Venous US of R [name] [name] on 04/29 showed partial thrombosis of both the right femoral vein and popliteal vein likely from chronic DVT, extensive subcutaneous edema in calf and ankle and reactive appearing right groin lymph nodes. Venous duplex on 05/22 showed intermediate age DVT right femoral vein and popliteal veinedema in calf and ankle. Echo on 05/23 showed mildly dilated LV w/ normal systolic function: EF 16-10% but Grade 1 diastolic dysfunction. There was no evidence of elevated L ventricular filling. Trivial aortic regurgitation. R heart normal. [name] [name] recurring cellulitis is most likely 2/2 chronic venous stasis. [name] [name] was fitted for an unna boot on his R [name] [name] by ortho yesterday. He states that he likes the boot. Boot dressing changes will be done every Tuesday and Saturday until d/c.  -Start bridging heparin to coumadin. INR was 1.32 today. Warfarin and heparin dose per pharm. -Continue neurovascular checks q12. -will need to f/u with [facility] dermatology and consider f/u with wound outpatient    Polysubstance abuse: [name] [name] states that he smokes 6-7 ciggarettes per day and has used cocaine in the past but stopped in January. UDS on 05/23 was negative.  -Nicotine patch 21 mg.  Major depressive disorder / Bipolar 1 Disorder -Patient is on home medications of Cymbalta 60 mg qd, Trazadone 100 mg qhs prn, Doxepin 25 mg qhs sleep, Gabapentin 200 mg tid  Asthma: Currently not an acute problem. Patient states that he uses albuterol 3-4 times a week. Patient denies feeling SOB today. He is on his home dose of Albuterol as needed. - Albuterol prn -  Guaifenesin-dextromethorphan prn for mucus clearing and cough.  MRSA nares -Contact precautions    FEN F: 39mL NS wash q12 E: None N: Regular diet  VTE prophylaxis - Bridging coumadin (2000u), Warfarin  Code: Full  Dispo: Possible discharge today if patient is able to get 4-5 day supply of Lovenox at a low cost. Will follow-up with care manager. If not, patient's dispo is deferred until INR reaches therapeutic level of 2.0.  [name] [name] is currently established with Dellia Nims, MD at [place] / [place] [address] as his PCP. He will need a follow-up appointment after discharge.  .Services Needed at time of discharge: Y = Yes, Blank = No PT:   OT:   RN:   Equipment:   Other:     LOS: 3 days   Thanh-Tam Cardell Peach, Med  Student 12/02/2014, 6:20 AM

## 2014-12-02 NOTE — Plan of Care (Signed)
Problem: Consults Goal: Diagnosis - Venous Thromboembolism (VTE) Choose a selection  Outcome: Adequate for Discharge DVT (Deep Vein Thrombosis)   

## 2014-12-02 NOTE — Progress Notes (Signed)
Patient was discharged to a substance abuse facility by MD order; discharged instructions  review and give to patient with care notes and prescriptions; IV DIC; skin - Rolena Infante on RLE; patient will be escorted to the car by nurse via wheelchair; personal from with the patient.

## 2014-12-02 NOTE — Progress Notes (Addendum)
ANTICOAGULATION CONSULT NOTE - Follow Up Consult  Pharmacy Consult for Heparin / Coumadin Indication: pulmonary embolus / DVT history  Allergies  Allergen Reactions  . Xarelto [Rivaroxaban] Dermatitis    Blisters skin  . Clindamycin/Lincomycin Dermatitis    Severe rash/dermatitis from November 2015 still present Jan 2016 from clinda    Vital Signs: Temp: 98 F (36.7 C) (05/25 0530) Temp Source: Oral (05/25 0530) BP: 105/68 mmHg (05/25 0530) Pulse Rate: 77 (05/25 0530)  Labs:  Recent Labs  11/29/14 1801 11/29/14 1839 11/30/14 0118  12/01/14 0720 12/01/14 0912 12/01/14 2040 12/02/14 0740  HGB 15.9  --  14.4  --  14.6  --   --   --   HCT 47.7  --  43.6  --  44.2  --   --   --   PLT 235  --  209  --  208  --   --   --   LABPROT  --  17.8*  --   --   --   --   --  16.5*  INR  --  1.45  --   --   --   --   --  1.32  HEPARINUNFRC  --   --   --   < >  --  0.17* 0.26* 0.88*  CREATININE 1.04  --  1.11  --   --   --   --  0.99  < > = values in this interval not displayed.  Estimated Creatinine Clearance: 123.3 mL/min (by C-G formula based on Cr of 0.99).  Assessment: 49 year old male on Coumadin prior to admission for DVT and PE history.  INR sub-therapeutic on admission, now on heparin bridge.   HL = 0.88 after rate increased to 2000 units/hr.  HL slightly above goal. Coumadin restarted 5/24.  INR 1.32.  Missed 2 days of coumadin b/c held on admission.  CBC stable. No bleeding reported.  On doxy day # 2 which may increase INR.  Home coumadin 12.5 mg M/F and 10 mg all other days. Last dose 5/21.    Goal of Therapy:  Heparin level 0.3-0.7 units/ml Monitor platelets by anticoagulation protocol: Yes   Plan:  -decrease heparin drip to 1900 units/hr -coumadin 12.5 mg po x 1 dose -daily HL, CBC, INR - if desire to transition to LMWH dose would be 100 mg sq q12h or 150 mg sq q24  Eudelia Bunch, Pharm.D. [phone] 12/02/2014 9:54 AM

## 2014-12-02 NOTE — Discharge Instructions (Signed)
Thank you for trusting [name] with your medical care!  You were hospitalized for cellulitis and treated with antibiotics.   Please take note of the following changes to your medications: CONTINUE doxycycline 100 mg twice daily until you run out of medication CONTINUE Lovenox 40mg  injections daily  To make sure you are getting better, please make it to the follow-up appointments listed on the first page.  If you have any questions, please call [phone].

## 2014-12-02 NOTE — Plan of Care (Signed)
Problem: Consults Goal: Diagnosis - Venous Thromboembolism (VTE) Choose a selection  DVT (Deep Vein Thrombosis)     

## 2014-12-03 ENCOUNTER — Telehealth: Payer: Self-pay | Admitting: Pharmacist

## 2014-12-03 NOTE — Telephone Encounter (Addendum)
Call to patient to confirm appointment for 12/04/14 at 2:00 lmtcb

## 2014-12-04 ENCOUNTER — Ambulatory Visit (INDEPENDENT_AMBULATORY_CARE_PROVIDER_SITE_OTHER): Payer: Self-pay | Admitting: Pharmacist

## 2014-12-04 DIAGNOSIS — I82509 Chronic embolism and thrombosis of unspecified deep veins of unspecified lower extremity: Secondary | ICD-10-CM

## 2014-12-04 DIAGNOSIS — Z7901 Long term (current) use of anticoagulants: Secondary | ICD-10-CM

## 2014-12-04 LAB — POCT INR: INR: 1.3

## 2014-12-04 NOTE — Progress Notes (Signed)
CLINICAL PHARMACIST ANTICOAG NOTE [name] [name] is a 49 y.o. male who reports to the clinic for monitoring of warfarin treatment.    Indication: DVT Duration: indefinite  Anticoagulation Clinic Visit History: Anticoagulation Episode Summary    Current INR goal   Next INR check 12/11/2014  INR from last check 1.3 (12/04/2014)  Weekly max dose   Target end date   INR check location   Preferred lab   Send INR reminders to ANTICOAG IMP   Indications  Long-term (current) use of anticoagulants [Z79.01] DVT (Resolved) [I82.409] 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]        Comments       Anticoagulation Care Providers    Provider Role Specialty Phone number   Bartholomew Crews, MD  Internal Medicine [phone]     ASSESSMENT Recent Results: Recent results are below, the most recent result is correlated with a dose of 75 mg per week. Of note, dose was held and patient was bridged with heparin during recent hospital admission on 11/29/14. Lab Results  Component Value Date   INR 1.3 12/04/2014   INR 1.32 12/02/2014   INR 1.45 11/29/2014    INR today: Subtherapeutic  Anticoagulation Dosing: INR as of 12/04/2014 and Previous Dosing Information    INR Dt INR Goal [name] Sun Mon Tue Wed Thu Fri Sat   12/04/2014 1.3 - 75 mg 10 mg 12.5 mg 10 mg 10 mg 10 mg 12.5 mg 10 mg    Anticoagulation Dose Instructions as of 12/04/2014      Total Sun Mon Tue Wed Thu Fri Sat   New Dose 82.5 mg 12.5 mg 12.5 mg 10 mg 12.5 mg 10 mg 12.5 mg 12.5 mg     (5 mg x 2.5)  (5 mg x 2.5)  (5 mg x 2)  (5 mg x 2.5)  (5 mg x 2)  (5 mg x 2.5)  (5 mg x 2.5)                           PLAN Weekly dose was increased by 10% to 82.5 mg per week  Patient Instructions  Patient educated about medication as defined in this encounter and verbalized understanding by repeating back instructions provided.    Follow-up Return in about 1 week (around 12/11/2014) for Follow up INR on 12/11/14 at  [place] [facility] Pharmacist  15 minutes spent face-to-face with the patient during the encounter. 50% of time spent on education. 50% of time was spent on assessment and dose adjustment.

## 2014-12-04 NOTE — Patient Instructions (Signed)
Patient educated about medication as defined in this encounter and verbalized understanding by repeating back instructions provided.   

## 2014-12-06 LAB — CULTURE, BLOOD (ROUTINE X 2)
Culture: NO GROWTH
Culture: NO GROWTH

## 2014-12-08 NOTE — Progress Notes (Signed)
Indication: Recurrent deep venous thromboses. Duration: Lifelong. INR: Below target. Agree with Dr. Julianne Rice assessment and plan.

## 2014-12-11 ENCOUNTER — Ambulatory Visit (INDEPENDENT_AMBULATORY_CARE_PROVIDER_SITE_OTHER): Payer: Self-pay | Admitting: Pharmacist

## 2014-12-11 DIAGNOSIS — I82501 Chronic embolism and thrombosis of unspecified deep veins of right lower extremity: Secondary | ICD-10-CM

## 2014-12-11 DIAGNOSIS — I82509 Chronic embolism and thrombosis of unspecified deep veins of unspecified lower extremity: Secondary | ICD-10-CM

## 2014-12-11 DIAGNOSIS — Z7901 Long term (current) use of anticoagulants: Secondary | ICD-10-CM

## 2014-12-11 LAB — POCT INR: INR: 1.5

## 2014-12-11 NOTE — Patient Instructions (Signed)
Patient educated about medication as defined in this encounter and verbalized understanding by repeating back instructions provided.   

## 2014-12-11 NOTE — Progress Notes (Signed)
CLINICAL PHARMACIST ANTICOAG NOTE NASIRE REALI is a 49 y.o. male who reports to the clinic for monitoring of warfarin treatment.    Indication: DVT Duration: indefinite  Anticoagulation Clinic Visit History: Anticoagulation Episode Summary    Current INR goal   Next INR check 12/14/2014  INR from last check 1.5 (12/11/2014)  Weekly max dose   Target end date   INR check location   Preferred lab   Send INR reminders to ANTICOAG IMP   Indications  Long-term (current) use of anticoagulants [Z79.01] DVT (Resolved) [I82.409] 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]        Comments       Anticoagulation Care Providers    Provider Role Specialty Phone number   Bartholomew Crews, MD  Internal Medicine [phone]     ASSESSMENT Recent Results: Recent results are below, the most recent result is correlated with a dose of 82.5 mg per week. Patient was also taking enoxaparin for bridge therapy over the past week but has run out of the medication. Lab Results  Component Value Date   INR 1.5 12/11/2014   INR 1.3 12/04/2014   INR 1.32 12/02/2014    INR today: Subtherapeutic  Anticoagulation Dosing: INR as of 12/11/2014 and Previous Dosing Information    INR Dt INR Goal [name] Sun Mon Tue Wed Thu Fri Sat   12/11/2014 1.5 - 82.5 mg 12.5 mg 12.5 mg 10 mg 12.5 mg 10 mg 12.5 mg 12.5 mg    Anticoagulation Dose Instructions as of 12/11/2014      Total Sun Mon Tue Wed Thu Fri Sat   New Dose 92.5 mg 15 mg 12.5 mg 12.5 mg 12.5 mg 12.5 mg 15 mg 12.5 mg     (5 mg x 3)  (5 mg x 2.5)  (5 mg x 2.5)  (5 mg x 2.5)  (5 mg x 2.5)  (5 mg x 3)  (5 mg x 2.5)                           PLAN Weekly dose was increased by 12% to 92.5 mg per week. Re-initiated bridge with enoxaparin 1 mg/kg twice daily.  Medication Samples have been provided to the patient.  Drug name: enoxaparin 100 mg  Qty: 5  LOT: [id number]  Exp.Date: 05/2017  The patient has been instructed regarding the  correct time, dose, and frequency of taking this medication, including desired effects and most common side effects.   Patient Instructions  Patient educated about medication as defined in this encounter and verbalized understanding by repeating back instructions provided.    Follow-up Return in about 3 days (around 12/14/2014) for Follow up INR 12/14/14 at 8:30 am.  Flossie Dibble Clinical Pharmacist  30 minutes spent face-to-face with the patient during the encounter. 50% of time spent on education. 50% of time was spent on assessment and plan.

## 2014-12-14 ENCOUNTER — Ambulatory Visit (INDEPENDENT_AMBULATORY_CARE_PROVIDER_SITE_OTHER): Payer: Self-pay | Admitting: Pharmacist

## 2014-12-14 DIAGNOSIS — I82509 Chronic embolism and thrombosis of unspecified deep veins of unspecified lower extremity: Secondary | ICD-10-CM

## 2014-12-14 DIAGNOSIS — Z7901 Long term (current) use of anticoagulants: Secondary | ICD-10-CM

## 2014-12-14 LAB — POCT INR: INR: 1.9

## 2014-12-14 NOTE — Patient Instructions (Signed)
Patient instructed to take medications as defined in the Anti-coagulation Track section of this encounter.  Patient instructed to take today's dose.  Patient verbalized understanding of these instructions.    

## 2014-12-14 NOTE — Progress Notes (Signed)
Anti-Coagulation Progress Note  CHRISTIAAN STREBECK is a 49 y.o. male who is currently on an anti-coagulation regimen.    RECENT RESULTS: Recent results are below, the most recent result is correlated with a dose of 92.5 mg. per week: Lab Results  Component Value Date   INR 1.90 12/14/2014   INR 1.5 12/11/2014   INR 1.3 12/04/2014    ANTI-COAG DOSE: Anticoagulation Dose Instructions as of 12/14/2014      Sun Mon Tue Wed Thu Fri Sat   New Dose 15 mg 15 mg 12.5 mg 15 mg 15 mg 15 mg 12.5 mg       ANTICOAG SUMMARY: Anticoagulation Episode Summary    Current INR goal   Next INR check 12/18/2014  INR from last check 1.90 (12/14/2014)  Weekly max dose   Target end date   INR check location   Preferred lab   Send INR reminders to ANTICOAG IMP   Indications  Long-term (current) use of anticoagulants [Z79.01] DVT (Resolved) [I82.409] 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]        Comments       Anticoagulation Care Providers    Provider Role Specialty Phone number   Bartholomew Crews, MD  Internal Medicine [phone]      ANTICOAG TODAY: Anticoagulation Summary as of 12/14/2014    INR goal   Selected INR 1.90 (12/14/2014)  Next INR check 12/18/2014  Target end date    Indications  Long-term (current) use of anticoagulants [Z79.01] DVT (Resolved) [I82.409] 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]      Anticoagulation Episode Summary    INR check location    Preferred lab    Send INR reminders to ANTICOAG IMP   Comments     Anticoagulation Care Providers    Provider Role Specialty Phone number   Bartholomew Crews, MD  Internal Medicine [phone]      PATIENT INSTRUCTIONS: Patient Instructions  Patient instructed to take medications as defined in the Anti-coagulation Track section of this encounter.  Patient instructed to take today's dose.  Patient verbalized understanding of these instructions.       FOLLOW-UP Return in  about 4 days (around 12/18/2014) for Follow up INR AND see his MD at 1030h.  Jorene Guest, III Pharm.D., CACP

## 2014-12-17 ENCOUNTER — Ambulatory Visit: Payer: Self-pay | Admitting: Pharmacist

## 2014-12-17 ENCOUNTER — Telehealth: Payer: Self-pay | Admitting: Pulmonary Disease

## 2014-12-17 NOTE — Telephone Encounter (Signed)
Call to patient to confirm appointment for 12/18/14 at 10:00 and 10:45 lmtcb

## 2014-12-18 ENCOUNTER — Ambulatory Visit (INDEPENDENT_AMBULATORY_CARE_PROVIDER_SITE_OTHER): Payer: Self-pay | Admitting: Pulmonary Disease

## 2014-12-18 ENCOUNTER — Ambulatory Visit (INDEPENDENT_AMBULATORY_CARE_PROVIDER_SITE_OTHER): Payer: Self-pay | Admitting: Pharmacist

## 2014-12-18 ENCOUNTER — Encounter: Payer: Self-pay | Admitting: Pulmonary Disease

## 2014-12-18 VITALS — BP 112/77 | HR 87 | Temp 97.8°F | Ht 76.0 in | Wt 259.4 lb

## 2014-12-18 DIAGNOSIS — K029 Dental caries, unspecified: Secondary | ICD-10-CM | POA: Insufficient documentation

## 2014-12-18 DIAGNOSIS — Z7901 Long term (current) use of anticoagulants: Secondary | ICD-10-CM

## 2014-12-18 DIAGNOSIS — I82509 Chronic embolism and thrombosis of unspecified deep veins of unspecified lower extremity: Secondary | ICD-10-CM

## 2014-12-18 DIAGNOSIS — I82501 Chronic embolism and thrombosis of unspecified deep veins of right lower extremity: Secondary | ICD-10-CM

## 2014-12-18 DIAGNOSIS — B9689 Other specified bacterial agents as the cause of diseases classified elsewhere: Secondary | ICD-10-CM

## 2014-12-18 DIAGNOSIS — L03115 Cellulitis of right lower limb: Secondary | ICD-10-CM

## 2014-12-18 LAB — POCT INR: INR: 2.9

## 2014-12-18 MED ORDER — DOXYCYCLINE HYCLATE 100 MG PO TABS: 100.0000 mg | ORAL_TABLET | Freq: Two times a day (BID) | ORAL | Status: DC

## 2014-12-18 MED ORDER — LIDOCAINE 5 % EX OINT: 1.0000 "application " | TOPICAL_OINTMENT | Freq: Every day | CUTANEOUS | Status: DC

## 2014-12-18 MED ORDER — CLOBETASOL PROPIONATE 0.05 % EX CREA: 1.0000 "application " | TOPICAL_CREAM | Freq: Every day | CUTANEOUS | Status: DC

## 2014-12-18 NOTE — Patient Instructions (Signed)
Patient instructed to take medications as defined in the Anti-coagulation Track section of this encounter.  Patient instructed to take today's dose.  Patient verbalized understanding of these instructions.    

## 2014-12-18 NOTE — Progress Notes (Signed)
Internal Medicine Clinic Attending  Case discussed with Dr. Krall at the time of the visit.  We reviewed the resident's history and exam and pertinent patient test results.  I agree with the assessment, diagnosis, and plan of care documented in the resident's note.  

## 2014-12-18 NOTE — Progress Notes (Signed)
Subjective:   Patient ID: [name] [name], male    DOB: [birth year], 49 y.o.   MRN: [id number]  HPI [name] [name] is a 49 year old man with history of PE/DVT on coumadin, asthma, GERD presenting for follow-up.  He was hospitalized 11/29/2014-12/02/2014 for chronic venous stasis complicated by chronic DVT of right lower extremity with superimposed cellulitis. He was discharged with doxycycline 100 mg twice a day to complete a 7 day course of antibiotics.  He reports his wound was initially improving. He states that the wound is starting to flare up again. He has noted increased warmth. He had not seen his dermatologist. He also was unable to maintain the unna boot since he did not have supplies. Denies fevers or chills.  Review of Systems  Constitutional: no fevers/chills Eyes: no vision changes Ears, nose, mouth, throat, and face: no cough Respiratory: no shortness of breath Cardiovascular: no chest pain Gastrointestinal: no nausea/vomiting, no abdominal pain, no constipation, no diarrhea Genitourinary: no dysuria, no hematuria Integument: no rash Hematologic/lymphatic: no bleeding/bruising, +edema Musculoskeletal: no arthralgias, no myalgias Neurological: no paresthesias, no weakness  Past Medical History  Diagnosis Date  . DVT (deep venous thrombosis)     on Coumadin Rx  . Substance abuse     crack, cocaine, last use 2007  . Tobacco abuse   . DVT (deep venous thrombosis)   . Bronchitis     last time 2 yrs ago  . Heart murmur   . Shortness of breath   . Tuberculosis     ' I test Positive "  . Asthma     uses Albuterol daily as needed  . Cough     smokers  . Arthritis     knees  . Joint pain   . GERD (gastroesophageal reflux disease)     only takes something about 2 times a yr  . Cellulitis   . Bipolar 1 disorder   . Alcohol abuse   . Pulmonary embolism 2014  . Chronic dermatitis     due to Xarelto Rx  . Depression   . GERD (gastroesophageal reflux disease)      Current Outpatient Prescriptions on File Prior to Visit  Medication Sig Dispense Refill  . acetaminophen (TYLENOL) 500 MG tablet Take 1,000 mg by mouth every 8 (eight) hours as needed (pain).    Marland Kitchen albuterol (PROVENTIL HFA;VENTOLIN HFA) 108 (90 BASE) MCG/ACT inhaler Inhale 2 puffs into the lungs every 4 (four) hours as needed for wheezing or shortness of breath.     . clobetasol cream (TEMOVATE) 8.50 % Apply 1 application topically 2 (two) times daily. Apply to legs/arms/hands twice daily as needed for itching (Patient taking differently: Apply 1 application topically daily. ) 30 g 0  . doxepin (SINEQUAN) 25 MG capsule Take 1 capsule (25 mg total) by mouth at bedtime as needed (sleep). 30 capsule 0  . doxycycline (VIBRA-TABS) 100 MG tablet Take 1 tablet (100 mg total) by mouth every 12 (twelve) hours. 9 tablet 0  . DULoxetine (CYMBALTA) 60 MG capsule Take 1 capsule (60 mg total) by mouth daily. 30 capsule 0  . furosemide (LASIX) 20 MG tablet Take 1 tablet (20 mg total) by mouth daily. 7 tablet 0  . gabapentin (NEURONTIN) 100 MG capsule Take 2 capsules (200 mg total) by mouth 3 (three) times daily. (Patient taking differently: Take 200 mg by mouth 3 (three) times daily. 9am, 4pm, 9pm) 180 capsule 0  . hydrocerin (EUCERIN) CREA Apply 1 application topically daily.  454 g 0  . hydrOXYzine (ATARAX/VISTARIL) 25 MG tablet Take 1 tablet (25 mg total) by mouth 3 (three) times daily as needed for itching. 30 tablet 0  . lidocaine (XYLOCAINE) 5 % ointment Apply 1 application topically daily.    . mineral oil-hydrophilic petrolatum (AQUAPHOR) ointment Apply 1 application topically 3 (three) times daily as needed for dry skin or irritation.    . mupirocin nasal ointment (BACTROBAN) 2 % Use one-half of tube in each nostril twice daily for five (5) days. After application, press sides of nose together and gently massage. 10 g 0  . Skin Protectants, Misc. (EUCERIN) cream Apply 1 application topically 2 (two)  times daily as needed for dry skin (skin irritation).    . traZODone (DESYREL) 100 MG tablet Take 1 tablet (100 mg total) by mouth at bedtime as needed for sleep. 14 tablet 0  . warfarin (COUMADIN) 5 MG tablet Take 2 1/2 tablets (12.5 mg) on Mon and Fri, take 2 tablets (10 mg) on Sun, Tue, Wed, Thur, Sat (Patient taking differently: Take 5 mg by mouth daily at 6 PM. Take 12.5 mg by mouth daily, except take 15 mg on Fridays and Sundays.) 65 tablet 3   No current facility-administered medications on file prior to visit.    Today's Vitals   12/18/14 1031 12/18/14 1033  BP: 112/77   Pulse: 87   Temp: 97.8 F (36.6 C)   TempSrc: Oral   Height: 6\' 4"  (1.93 m)   Weight: 259 lb 6.4 oz (117.663 kg)   SpO2: 98%   PainSc:  6     Objective:  Physical Exam  Constitutional: He appears well-developed and well-nourished. No distress.  HENT:  Head: Normocephalic and atraumatic.  Mouth/Throat: Abnormal dentition (upper incisiors chipped).  Eyes: Conjunctivae are normal.  Neck: Neck supple.  Cardiovascular: Normal rate and regular rhythm.   Pulmonary/Chest: Effort normal and breath sounds normal.  Abdominal: Soft.  Musculoskeletal: Normal range of motion. He exhibits edema (2+).  Skin: Skin is dry. There is erythema (mild in RLE).  Psychiatric: He has a normal mood and affect.     Assessment & Plan:  Please refer to problem based charting.

## 2014-12-18 NOTE — Progress Notes (Signed)
Anti-Coagulation Progress Note  [name] [name] is a 49 y.o. male who is currently on an anti-coagulation regimen.    RECENT RESULTS: Recent results are below, the most recent result is correlated with a dose of 100 mg. per week: Lab Results  Component Value Date   INR 2.90 12/18/2014   INR 1.90 12/14/2014   INR 1.5 12/11/2014    ANTI-COAG DOSE: Anticoagulation Dose Instructions as of 12/18/2014      Dorene Grebe Tue Wed Thu Fri Sat   New Dose 12.5 mg 12.5 mg 15 mg 12.5 mg 15 mg 12.5 mg 12.5 mg    Description        Follow-up INR on 12/25/14 @ 1000       ANTICOAG SUMMARY: Anticoagulation Episode Summary    Current INR goal   Next INR check 12/25/2014  INR from last check 2.90 (12/18/2014)  Weekly max dose   Target end date   INR check location   Preferred lab   Send INR reminders to ANTICOAG IMP   Indications  Long-term (current) use of anticoagulants [Z79.01] DVT (Resolved) [I82.409] 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]        Comments       Anticoagulation Care Providers    Provider Role Specialty Phone number   Bartholomew Crews, MD  Internal Medicine [phone]      ANTICOAG TODAY: Anticoagulation Summary as of 12/18/2014    INR goal   Selected INR 2.90 (12/18/2014)  Next INR check 12/25/2014  Target end date    Indications  Long-term (current) use of anticoagulants [Z79.01] DVT (Resolved) [I82.409] 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  Patient instructed to take today's dose.  Patient verbalized  understanding of these instructions.        FOLLOW-UP Return in 7 days (on 12/25/2014) for Follow-up INR @ 1000.   Theron Arista, PharmD Clinical Pharmacist - Resident Pager: [phone] 6/10/201610:33 AM

## 2014-12-18 NOTE — Assessment & Plan Note (Signed)
Upper incisors chipped. Reports some tooth pain.  Plan: -Referral placed for dentistry

## 2014-12-18 NOTE — Assessment & Plan Note (Addendum)
He has completed his 7 day course of antibiotics including doxycycline 100 mg twice a day. Cellulitis improved from hospitalization but starting to recur. Some skin breakdown in posterior leg.  Plan: -Unna boot placed in clinic today -Wound care clinic referral -Doxycycline 100 bid for 7 day course -Follow up next week for wound check

## 2014-12-18 NOTE — Assessment & Plan Note (Signed)
He is followed in our anticoagulation clinic. Last INR on 12/14/2014 1.9. He has follow-up visit with anticoagulation clinic today and INR will be rechecked.

## 2014-12-21 ENCOUNTER — Encounter: Payer: Self-pay | Admitting: *Deleted

## 2014-12-21 NOTE — Progress Notes (Unsigned)
Pt stopped by clinic to make appointments for f/u this Wednesday. He is also asking for a letter to be written to [employer] stating he has not been able to work since January. This is so he cal still get food stamps.   He also states DSS is working on disability paperwork.  FYI

## 2014-12-23 ENCOUNTER — Ambulatory Visit (INDEPENDENT_AMBULATORY_CARE_PROVIDER_SITE_OTHER): Payer: Self-pay | Admitting: Pulmonary Disease

## 2014-12-23 ENCOUNTER — Ambulatory Visit (INDEPENDENT_AMBULATORY_CARE_PROVIDER_SITE_OTHER): Payer: Self-pay | Admitting: Pharmacist

## 2014-12-23 ENCOUNTER — Encounter: Payer: Self-pay | Admitting: Pulmonary Disease

## 2014-12-23 VITALS — BP 114/80 | HR 87 | Temp 97.7°F | Ht 76.0 in | Wt 259.7 lb

## 2014-12-23 DIAGNOSIS — Z7901 Long term (current) use of anticoagulants: Secondary | ICD-10-CM

## 2014-12-23 DIAGNOSIS — I82509 Chronic embolism and thrombosis of unspecified deep veins of unspecified lower extremity: Secondary | ICD-10-CM

## 2014-12-23 DIAGNOSIS — L03115 Cellulitis of right lower limb: Secondary | ICD-10-CM

## 2014-12-23 LAB — POCT INR: INR: 2.8

## 2014-12-23 NOTE — Progress Notes (Signed)
Anti-Coagulation Progress Note  MARGIE BRINK is a 49 y.o. male who is currently on an anti-coagulation regimen.    RECENT RESULTS: Recent results are below, the most recent result is correlated with a dose of 92.5 mg. per week: Lab Results  Component Value Date   INR 2.80 12/23/2014   INR 2.90 12/18/2014   INR 1.90 12/14/2014    ANTI-COAG DOSE: Anticoagulation Dose Instructions as of 12/23/2014      Dorene Grebe Tue Wed Thu Fri Sat   New Dose 12.5 mg 12.5 mg 15 mg 12.5 mg 15 mg 12.5 mg 12.5 mg       ANTICOAG SUMMARY: Anticoagulation Episode Summary    Current INR goal   Next INR check 01/04/2015  INR from last check 2.80 (12/23/2014)  Weekly max dose   Target end date   INR check location   Preferred lab   Send INR reminders to ANTICOAG IMP   Indications  Long-term (current) use of anticoagulants [Z79.01] DVT (Resolved) [I82.409] 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]        Comments       Anticoagulation Care Providers    Provider Role Specialty Phone number   Bartholomew Crews, MD  Internal Medicine [phone]      ANTICOAG TODAY: Anticoagulation Summary as of 12/23/2014    INR goal   Selected INR 2.80 (12/23/2014)  Next INR check 01/04/2015  Target end date    Indications  Long-term (current) use of anticoagulants [Z79.01] DVT (Resolved) [I82.409] 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  Patient instructed to take today's dose.  Patient verbalized understanding of these instructions.        FOLLOW-UP Return in 12 days (on 01/04/2015) for Follow up INR at 1015h.  Jorene Guest, III Pharm.D., CACP

## 2014-12-23 NOTE — Progress Notes (Signed)
Internal Medicine Clinic Attending  Case discussed with Dr. Randell Patient at the time of the visit.  We reviewed the resident's history and exam and pertinent patient test results.  I agree with the assessment, diagnosis, and plan of care documented in the resident's note. [facility]

## 2014-12-23 NOTE — Patient Instructions (Signed)
Please follow up with the wound center to have your unna boot changed and wounds checked. Do not take antibiotic until after being seen by the wound center. If they do not think you need the antibiotic, do not take it.

## 2014-12-23 NOTE — Assessment & Plan Note (Signed)
Unna boot in place. Wound has been stable without antibiotics.  Plan: -Patient to follow up with wound clinic tomorrow -Instructed patient to not take antibiotic until after wound check. If wound care center decides that he does not need an antibiotic, instructed patient to not take it.  -Letter provided noting his hospitalizations since January  -Follow up in 1-2 months with PCP

## 2014-12-23 NOTE — Progress Notes (Signed)
Subjective:   Patient ID: [name] [name], male    DOB: [birth year], 49 y.o.   MRN: [id number]  HPI [name] [name] is a 49 year old man with history of PE/DVT on coumadin, asthma, GERD presenting for follow-up.  He was last seen in clinic 12/18/2014 for hospital follow-up. New Unna boot was placed.  Today he reports he feels the same. He has not started his antibiotic. He noted some weeping from his wound but no purulent drainage. He has an appointment with the wound care clinic tomorrow for exchange of the Unna boot and wound check. Denies fevers or chills.  Review of Systems Constitutional: no fevers/chills Eyes: no vision changes Ears, nose, mouth, throat, and face: no cough Respiratory: no shortness of breath Cardiovascular: no chest pain Gastrointestinal: no nausea/vomiting, no abdominal pain, no constipation, no diarrhea Genitourinary: no dysuria, no hematuria Integument: no rash Hematologic/lymphatic: no bleeding/bruising, +edema Musculoskeletal: no arthralgias, no myalgias Neurological: no paresthesias, no weakness  Past Medical History  Diagnosis Date  . DVT (deep venous thrombosis)     on Coumadin Rx  . Substance abuse     crack, cocaine, last use 2007  . Tobacco abuse   . DVT (deep venous thrombosis)   . Bronchitis     last time 2 yrs ago  . Heart murmur   . Shortness of breath   . Tuberculosis     ' I test Positive "  . Asthma     uses Albuterol daily as needed  . Cough     smokers  . Arthritis     knees  . Joint pain   . GERD (gastroesophageal reflux disease)     only takes something about 2 times a yr  . Cellulitis   . Bipolar 1 disorder   . Alcohol abuse   . Pulmonary embolism 2014  . Chronic dermatitis     due to Xarelto Rx  . Depression   . GERD (gastroesophageal reflux disease)     Current Outpatient Prescriptions on File Prior to Visit  Medication Sig Dispense Refill  . acetaminophen (TYLENOL) 500 MG tablet Take 1,000 mg by mouth every 8  (eight) hours as needed (pain).    Marland Kitchen albuterol (PROVENTIL HFA;VENTOLIN HFA) 108 (90 BASE) MCG/ACT inhaler Inhale 2 puffs into the lungs every 4 (four) hours as needed for wheezing or shortness of breath.     . clobetasol cream (TEMOVATE) 7.62 % Apply 1 application topically daily. 30 g 2  . doxepin (SINEQUAN) 25 MG capsule Take 1 capsule (25 mg total) by mouth at bedtime as needed (sleep). 30 capsule 0  . doxycycline (VIBRA-TABS) 100 MG tablet Take 1 tablet (100 mg total) by mouth every 12 (twelve) hours. 14 tablet 0  . DULoxetine (CYMBALTA) 60 MG capsule Take 1 capsule (60 mg total) by mouth daily. 30 capsule 0  . furosemide (LASIX) 20 MG tablet Take 1 tablet (20 mg total) by mouth daily. 7 tablet 0  . gabapentin (NEURONTIN) 100 MG capsule Take 2 capsules (200 mg total) by mouth 3 (three) times daily. (Patient taking differently: Take 200 mg by mouth 3 (three) times daily. 9am, 4pm, 9pm) 180 capsule 0  . hydrocerin (EUCERIN) CREA Apply 1 application topically daily. 454 g 0  . hydrOXYzine (ATARAX/VISTARIL) 25 MG tablet Take 1 tablet (25 mg total) by mouth 3 (three) times daily as needed for itching. 30 tablet 0  . lidocaine (XYLOCAINE) 5 % ointment Apply 1 application topically daily. 35.44 g 2  .  mineral oil-hydrophilic petrolatum (AQUAPHOR) ointment Apply 1 application topically 3 (three) times daily as needed for dry skin or irritation.    . mupirocin nasal ointment (BACTROBAN) 2 % Use one-half of tube in each nostril twice daily for five (5) days. After application, press sides of nose together and gently massage. 10 g 0  . Skin Protectants, Misc. (EUCERIN) cream Apply 1 application topically 2 (two) times daily as needed for dry skin (skin irritation).    . traZODone (DESYREL) 100 MG tablet Take 1 tablet (100 mg total) by mouth at bedtime as needed for sleep. 14 tablet 0  . warfarin (COUMADIN) 5 MG tablet Take 2 1/2 tablets (12.5 mg) on Mon and Fri, take 2 tablets (10 mg) on Sun, Tue, Wed, Thur,  Sat (Patient taking differently: Take 5 mg by mouth daily at 6 PM. Take 12.5 mg by mouth daily, except take 15 mg on Fridays and Sundays.) 65 tablet 3   No current facility-administered medications on file prior to visit.    Today's Vitals   12/23/14 0943 12/23/14 0944  BP: 114/80   Pulse: 87   Temp: 97.7 F (36.5 C)   TempSrc: Oral   Height: 6\' 4"  (1.93 m)   Weight: 259 lb 11.2 oz (117.799 kg)   SpO2: 100%   PainSc: 4  4   PainLoc: Leg    Objective:  Physical Exam  Constitutional: He appears well-developed and well-nourished. No distress.  HENT:  Head: Normocephalic and atraumatic.  Mouth/Throat: Abnormal dentition (upper incisiors chipped).  Eyes: Conjunctivae are normal.  Neck: Neck supple.  Cardiovascular: Normal rate and regular rhythm.   Pulmonary/Chest: Effort normal and breath sounds normal.  Abdominal: Soft.  Musculoskeletal: Normal range of motion. He exhibits edema (2+).  Skin: Skin is dry.  Right lower extremity into the Unna boot.  Psychiatric: He has a normal mood and affect.    Assessment & Plan:  Please refer to problem based charting.

## 2014-12-23 NOTE — Patient Instructions (Signed)
Patient instructed to take medications as defined in the Anti-coagulation Track section of this encounter.  Patient instructed to take today's dose.  Patient verbalized understanding of these instructions.    

## 2014-12-24 ENCOUNTER — Encounter: Payer: Self-pay | Attending: Surgery | Admitting: Surgery

## 2014-12-24 DIAGNOSIS — F17218 Nicotine dependence, cigarettes, with other nicotine-induced disorders: Secondary | ICD-10-CM | POA: Insufficient documentation

## 2014-12-24 DIAGNOSIS — K219 Gastro-esophageal reflux disease without esophagitis: Secondary | ICD-10-CM | POA: Insufficient documentation

## 2014-12-24 DIAGNOSIS — I739 Peripheral vascular disease, unspecified: Secondary | ICD-10-CM | POA: Insufficient documentation

## 2014-12-24 DIAGNOSIS — I87001 Postthrombotic syndrome without complications of right lower extremity: Secondary | ICD-10-CM | POA: Insufficient documentation

## 2014-12-24 DIAGNOSIS — J45909 Unspecified asthma, uncomplicated: Secondary | ICD-10-CM | POA: Insufficient documentation

## 2014-12-24 DIAGNOSIS — F329 Major depressive disorder, single episode, unspecified: Secondary | ICD-10-CM | POA: Insufficient documentation

## 2014-12-25 NOTE — Progress Notes (Signed)
[name], [name] ([id number]) Visit Report for 12/24/2014 Abuse/Suicide Risk Screen Details Patient Name: [name] [name], [name] [name]. Date of Service: 12/24/2014 10:15 AM Medical Record Number: [id number] Patient Account Number: [id number] Date of Birth/Sex: 1966-03-03 (49 y.o. Male) Treating RN: Montey Hora Primary Care Physician: Dellia Nims Other Clinician: Referring Physician: Dellia Nims Treating Physician/Extender: Frann Rider in Treatment: 0 Abuse/Suicide Risk Screen Items Answer ABUSE/SUICIDE RISK SCREEN: Has anyone close to you tried to hurt or harm you recentlyo No Do you feel uncomfortable with anyone in your familyo No Has anyone forced you do things that you didnot want to doo No Do you have any thoughts of harming yourselfo No Patient displays signs or symptoms of abuse and/or neglect. No Electronic Signature(s) Signed: 12/24/2014 5:00:03 PM By: Montey Hora Entered By: Montey Hora on 12/24/2014 10:54:13 Morejon, Jessie. ([id number]) -------------------------------------------------------------------------------- Activities of Daily Living Details Patient Name: [name], [name]. Date of Service: 12/24/2014 10:15 AM Medical Record Number: [id number] Patient Account Number: [id number] Date of Birth/Sex: 1966-06-21 (48 y.o. Male) Treating RN: Montey Hora Primary Care Physician: Dellia Nims Other Clinician: Referring Physician: Dellia Nims Treating Physician/Extender: Frann Rider in Treatment: 0 Activities of Daily Living Items Answer Activities of Daily Living (Please select one for each item) Drive Automobile Need Assistance Take Medications Completely Able Use Telephone Completely [place] for Appearance Completely Able Use Toilet Completely Able Bath / Shower Completely Able Dress Self Completely Able Feed Self Completely Able Walk Completely Able Get In / Out Bed Completely Able Housework Completely Able Prepare Meals Completely [place] for Self Completely Able Electronic Signature(s) Signed: 12/24/2014 5:00:03 PM By: Montey Hora Entered By: Montey Hora on 12/24/2014 10:54:35 Panuco, Dorene Grebe ([id number]) -------------------------------------------------------------------------------- Education Assessment Details Patient Name: [name] [name] Date of Service: 12/24/2014 10:15 AM Medical Record Number: [id number] Patient Account Number: [id number] Date of Birth/Sex: April 10, 1966 (49 y.o. Male) Treating RN: Montey Hora Primary Care Physician: Dellia Nims Other Clinician: Referring Physician: Dellia Nims Treating Physician/Extender: Frann Rider in Treatment: 0 Primary Learner Assessed: Patient Learning Preferences/Education Level/Primary Language Learning Preference: Explanation, Demonstration Highest Education Level: High School Preferred Language: English Cognitive Barrier Assessment/Beliefs Language Barrier: No Translator Needed: No Memory Deficit: No Emotional Barrier: No Cultural/Religious Beliefs Affecting Medical No Care: Physical Barrier Assessment Impaired Vision: No Impaired Hearing: No Decreased Hand dexterity: No Knowledge/Comprehension Assessment Knowledge Level: Medium Comprehension Level: Medium Ability to understand written Medium instructions: Ability to understand verbal Medium instructions: Motivation Assessment Anxiety Level: Calm Cooperation: Cooperative Education Importance: Acknowledges Need Interest in Health Problems: Asks Questions Perception: Coherent Willingness to Engage in Self- Medium Management Activities: Readiness to Engage in Self- Medium Management Activities: Electronic Signature(s) TOUSSAINT, GOLSON ([id number]) Signed: 12/24/2014 5:00:03 PM By: Montey Hora Entered By: Montey Hora on 12/24/2014 10:54:59 Baty, Emilio AMarland Kitchen  ([id number]) -------------------------------------------------------------------------------- Fall Risk Assessment Details Patient Name: [name] [name] Date of Service: 12/24/2014 10:15 AM Medical Record Number: [id number] Patient Account Number: [id number] Date of Birth/Sex: 06-02-1966 (48 y.o. Male) Treating RN: Montey Hora Primary Care Physician: Dellia Nims Other Clinician: Referring Physician: Dellia Nims Treating Physician/Extender: Frann Rider in Treatment: 0 Fall Risk Assessment Items FALL RISK ASSESSMENT: History of falling - immediate or within 3 months 0 No Secondary diagnosis 0 No Ambulatory aid None/bed rest/wheelchair/nurse 0 Yes Crutches/cane/walker 0 No Furniture 0 No IV Access/Saline Lock 0 No Gait/Training Normal/bed rest/immobile 0 No Weak 10 Yes Impaired 0 No Mental Status Oriented to own ability 0 Yes Electronic Signature(s) Signed: 12/24/2014 5:00:03  PM By: Montey Hora Entered By: Montey Hora on 12/24/2014 10:55:09 Colledge, Dorene Grebe ([id number]) -------------------------------------------------------------------------------- Foot Assessment Details Patient Name: [name] [name] Date of Service: 12/24/2014 10:15 AM Medical Record Number: [id number] Patient Account Number: [id number] Date of Birth/Sex: [birth year] (48 y.o. Male) Treating RN: Montey Hora Primary Care Physician: AHMED, TASRIF Other Clinician: Referring Physician: Dellia Nims Treating Physician/Extender: Frann Rider in Treatment: 0 Foot Assessment Items Site Locations + = Sensation present, - = Sensation absent, C = Callus, U = Ulcer R = Redness, W = Warmth, M = Maceration, PU = Pre-ulcerative lesion F = Fissure, S = Swelling, D = Dryness Assessment Right: Left: Other Deformity: No No Prior Foot Ulcer: No No Prior Amputation: No No Charcot Joint: No No Ambulatory Status: Ambulatory Without Help Gait: Steady Electronic Signature(s) Signed: 12/24/2014  5:00:03 PM By: Montey Hora Entered By: Montey Hora on 12/24/2014 11:10:26 Lubeck, Jediah A. ([id number]) -------------------------------------------------------------------------------- Nutrition Risk Assessment Details Patient Name: [name] [name] Date of Service: 12/24/2014 10:15 AM Medical Record Number: [id number] Patient Account Number: [id number] Date of Birth/Sex: June 02, 1966 (49 y.o. Male) Treating RN: Montey Hora Primary Care Physician: Dellia Nims Other Clinician: Referring Physician: Dellia Nims Treating Physician/Extender: Frann Rider in Treatment: 0 Height (in): Weight (lbs): Body Mass Index (BMI): Nutrition Risk Assessment Items NUTRITION RISK SCREEN: I have an illness or condition that made me change the kind and/or 0 No amount of food I eat I eat fewer than two meals per day 0 No I eat few fruits and vegetables, or milk products 0 No I have three or more drinks of beer, liquor or wine almost every day 0 No I have tooth or mouth problems that make it hard for me to eat 0 No I don't always have enough money to buy the food I need 0 No I eat alone most of the time 0 No I take three or more different prescribed or over-the-counter drugs a 1 Yes day Without wanting to, I have lost or gained 10 pounds in the last six 0 No months I am not always physically able to shop, cook and/or feed myself 0 No Nutrition Protocols Good Risk Protocol 0 No interventions needed Moderate Risk Protocol Electronic Signature(s) Signed: 12/24/2014 5:00:03 PM By: Montey Hora Entered By: Montey Hora on 12/24/2014 10:55:16

## 2014-12-25 NOTE — Progress Notes (Signed)
MONTFORD, BARG ([id number]) Visit Report for 12/24/2014 Chief Complaint Document Details Patient Name: [name] [name], [name] [name]. Date of Service: 12/24/2014 10:15 AM Medical Record Number: [id number] Patient Account Number: [id number] Date of Birth/Sex: 1965-07-18 (49 y.o. Male) Treating RN: Primary Care Physician: Dellia Nims Other Clinician: Referring Physician: Dellia Nims Treating Physician/Extender: Frann Rider in Treatment: 0 Information Obtained from: Patient Chief Complaint Patient presents to the wound care center for a consult due non healing wound. This patient comes from a [facility] family care physician as he's been having Unna's boots applied and was here for an opinion regarding his vascular insufficiency. He has been having problems for the last 6 years. Electronic Signature(s) Signed: 12/24/2014 1:17:45 PM By: Christin Fudge MD, FACS Entered By: Christin Fudge on 12/24/2014 13:06:54 [name] [name], [name] [name] Kitchen ([id number]) -------------------------------------------------------------------------------- HPI Details Patient Name: [name] [name] Date of Service: 12/24/2014 10:15 AM Medical Record Number: [id number] Patient Account Number: [id number] Date of Birth/Sex: Dec 22, 1965 (49 y.o. Male) Treating RN: Primary Care Physician: Dellia Nims Other Clinician: Referring Physician: Dellia Nims Treating Physician/Extender: Frann Rider in Treatment: 0 History of Present Illness Location: right lower extremity swelling with ulceration Quality: Patient reports experiencing a dull pain to affected area(s). Severity: Patient states wound (s) are getting better. Duration: Patient has had the wound for > 3 months prior to seeking treatment at the wound center Timing: Pain in wound is Intermittent (comes and goes Context: The wound appeared gradually over time Modifying Factors: Consults to this date include:he was recently admitted to hospital and given IV antiemetics for a  cellulitis of the right lower extremity. He also had some workup done to note his DVT status of the RLE Associated Signs and Symptoms: Patient reports having difficulty standing for long periods. HPI Description: 49 year old man with history of PE/DVT on coumadin, asthma, GERD. He was hospitalized 11/29/2014-12/02/2014 for chronic venous stasis complicated by chronic DVT of right lower extremity with superimposed cellulitis. He was discharged with doxycycline 100 mg twice a day to complete a 7 day course of antibiotics. He has a past medical history of DVT, substance abuse, tobacco abuse, bronchitis, tuberculosis, bipolar disorder, pulmonary embolism, chronic dermatitis, depression. Last ultrasound done of the lower extremities on 11/06/2014 show :IMPRESSION:1. Partial thrombosis of both the right femoral vein and popliteal vein. Favor chronic DVT in this setting.2. No other deep or superficial venous thrombosis identified in the right lower extremity.3. Extensive subcutaneous edema in the calf and ankle. An ultrasound was repeated on 11/30/2014 and basically showed the same findings. Electronic Signature(s) Signed: 12/24/2014 1:17:45 PM By: Christin Fudge MD, FACS Entered By: Christin Fudge on 12/24/2014 13:08:28 Polo, [name] [name] ([id number]) -------------------------------------------------------------------------------- Physical Exam Details Patient Name: [name] [name] Date of Service: 12/24/2014 10:15 AM Medical Record Number: [id number] Patient Account Number: [id number] Date of Birth/Sex: [birth year] (49 y.o. Male) Treating RN: Primary Care Physician: Dellia Nims Other Clinician: Referring Physician: AHMED, TASRIF Treating Physician/Extender: Frann Rider in Treatment: 0 Constitutional . Pulse regular. Respirations normal and unlabored. Afebrile. . Eyes Nonicteric. Reactive to light. Ears, Nose, Mouth, and Throat Lips, teeth, and gums WNL.Marland Kitchen Moist mucosa without lesions  . Neck supple and nontender. No palpable supraclavicular or cervical adenopathy. Normal sized without goiter. Respiratory WNL. No retractions.. Cardiovascular Pedal Pulses WNL his ABI on the right was 1.16 on the left was 0.86.. he had significant edema of the right lower extremity with findings of a post-thrombotic leg.. Chest Breasts symmetical and no nipple discharge.. Breast tissue WNL, no masses, lumps, or tenderness.Marland Kitchen  Gastrointestinal (GI) Abdomen without masses or tenderness.. No liver or spleen enlargement or tenderness.. Musculoskeletal Adexa without tenderness or enlargement.. Digits and nails w/o clubbing, cyanosis, infection, petechiae, ischemia, or inflammatory conditions.. Integumentary (Hair, Skin) at the present time the patient does not have any open wounds of the right lower extremity but he has signs and symptoms of stasis dermatitis and post thrombotic leg.Marland Kitchen No crepitus or fluctuance. No peri-wound warmth or erythema. No masses.Marland Kitchen Psychiatric Judgement and insight Intact.. No evidence of depression, anxiety, or agitation.. Electronic Signature(s) Signed: 12/24/2014 1:17:45 PM By: Christin Fudge MD, FACS Entered By: Christin Fudge on 12/24/2014 13:10:04 [name] [name], [name] [name] ([id number]) -------------------------------------------------------------------------------- Physician Orders Details Patient Name: [name] [name] Date of Service: 12/24/2014 10:15 AM Medical Record Number: [id number] Patient Account Number: [id number] Date of Birth/Sex: Dec 26, 1965 (49 y.o. Male) Treating RN: Montey Hora Primary Care Physician: Dellia Nims Other Clinician: Referring Physician: Dellia Nims Treating Physician/Extender: Frann Rider in Treatment: 0 Verbal / Phone Orders: Yes Clinician: Montey Hora Read Back and Verified: Yes Diagnosis Coding Discharge From [facility] Services o Discharge from [place] Signature(s) Signed: 12/24/2014 1:17:45 PM By:  Christin Fudge MD, FACS Signed: 12/24/2014 5:00:03 PM By: Montey Hora Entered By: Montey Hora on 12/24/2014 11:41:57 [name] [name], [name] [name] Kitchen ([id number]) -------------------------------------------------------------------------------- Problem List Details Patient Name: [name] [name] Date of Service: 12/24/2014 10:15 AM Medical Record Number: [id number] Patient Account Number: [id number] Date of Birth/Sex: [birth year] (49 y.o. 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 6/38/7564 Yes lower extremity I73.9 Peripheral vascular disease, unspecified 12/24/2014 Yes F17.218 Nicotine dependence, cigarettes, with other nicotine- 12/24/2014 Yes induced disorders Inactive Problems Resolved Problems Electronic Signature(s) Signed: 12/24/2014 1:17:45 PM By: Christin Fudge MD, FACS Entered By: Christin Fudge on 12/24/2014 13:15:47 [name] [name], [name] [name] Kitchen ([id number]) -------------------------------------------------------------------------------- Progress Note Details Patient Name: [name] [name] Date of Service: 12/24/2014 10:15 AM Medical Record Number: [id number] Patient Account Number: [id number] Date of Birth/Sex: 11-Jan-1966 (49 y.o. Male) Treating RN: Primary Care Physician: Dellia Nims Other Clinician: Referring Physician: Dellia Nims Treating Physician/Extender: Frann Rider in Treatment: 0 Subjective Chief Complaint Information obtained from Patient Patient presents to the wound care center for a consult due non healing wound. This patient comes from a [facility] family care physician as he's been having Unna's boots applied and was here for an opinion regarding his vascular insufficiency. He has been having problems for the  last 6 years. History of Present Illness (HPI) The following HPI elements were documented for the patient's wound: Location: right lower extremity swelling with ulceration Quality: Patient reports experiencing a dull pain to affected area(s). Severity: Patient states wound (s) are getting better. Duration: Patient has had the wound for > 3 months prior to seeking treatment at the wound center Timing: Pain in wound is Intermittent (comes and goes Context: The wound appeared gradually over time Modifying Factors: Consults to this date include:he was recently admitted to hospital and given IV antiemetics for a cellulitis of the right lower extremity. He also had some workup done to note his DVT status of the RLE Associated Signs and Symptoms: Patient reports having difficulty standing for long periods. 49 year old man with history of PE/DVT on coumadin, asthma, GERD. He was hospitalized 11/29/2014-12/02/2014 for chronic venous stasis complicated by chronic DVT of right lower extremity with superimposed cellulitis. He was discharged with doxycycline  100 mg twice a day to complete a 7 day course of antibiotics. He has a past medical history of DVT, substance abuse, tobacco abuse, bronchitis, tuberculosis, bipolar disorder, pulmonary embolism, chronic dermatitis, depression. Last ultrasound done of the lower extremities on 11/06/2014 show :IMPRESSION:1. Partial thrombosis of both the right femoral vein and popliteal vein. Favor chronic DVT in this setting.2. No other deep or superficial venous thrombosis identified in the right lower extremity.3. Extensive subcutaneous edema in the calf and ankle. An ultrasound was repeated on 11/30/2014 and basically showed the same findings. Wound History Patient presents with 1 open wound that has been present for approximately 8 months. Patient has been treating wound in the following manner: unna boots. Laboratory tests have not been performed in the  last month. Patient reportedly has not tested positive for an antibiotic resistant organism. Patient reportedly has not tested positive for osteomyelitis. Patient reportedly has had testing performed to evaluate circulation in [name] [name], [name] A. ([id number]) the legs. Patient experiences the following problems associated with their wounds: swelling. Patient History Information obtained from Patient. Allergies Xarelto, clindamycin Family History Cancer - Father, Paternal Grandparents, Hypertension - Mother, Maternal Grandparents, No family history of Diabetes, Heart Disease, Hereditary Spherocytosis, Kidney Disease, Lung Disease, Seizures, Stroke, Thyroid Problems, Tuberculosis. Social History Current every day smoker, Alcohol Use - Never - hx ETOH abuse, Drug Use - Prior History, Caffeine Use - Daily. Medical History Eyes Denies history of Cataracts, Glaucoma, Optic Neuritis Ear/Nose/Mouth/Throat Denies history of Chronic sinus problems/congestion, Middle ear problems Hematologic/Lymphatic Denies history of Anemia, Hemophilia, Human Immunodeficiency Virus, Lymphedema, Sickle Cell Disease Respiratory Patient has history of Asthma Denies history of Aspiration, Chronic Obstructive Pulmonary Disease (COPD), Pneumothorax, Sleep Apnea, Tuberculosis Cardiovascular Denies history of Angina, Arrhythmia, Congestive Heart Failure, Coronary Artery Disease, Deep Vein Thrombosis, Hypertension, Hypotension, Myocardial Infarction, Peripheral Arterial Disease, Peripheral Venous Disease, Phlebitis, Vasculitis Gastrointestinal Denies history of Cirrhosis , Colitis, Crohn s, Hepatitis A, Hepatitis B, Hepatitis C Endocrine Denies history of Type I Diabetes, Type II Diabetes Genitourinary Denies history of End Stage Renal Disease Immunological Denies history of Lupus Erythematosus, Raynaud s, Scleroderma Integumentary (Skin) Denies history of History of Burn, History of pressure  wounds Musculoskeletal Denies history of Gout, Rheumatoid Arthritis, Osteoarthritis, Osteomyelitis Neurologic Denies history of Dementia, Neuropathy, Quadriplegia, Paraplegia, Seizure Disorder Oncologic Denies history of Received Chemotherapy, Received Radiation Psychiatric Denies history of Anorexia/bulimia, Confinement Anxiety Reffitt, Imraan A. ([id number]) Medical And Surgical History Notes Respiratory hx bronchitis Gastrointestinal GERD Psychiatric depression Review of Systems (ROS) Constitutional Symptoms (General Health) The patient has no complaints or symptoms. Eyes The patient has no complaints or symptoms. Ear/Nose/Mouth/Throat The patient has no complaints or symptoms. Hematologic/Lymphatic Complains or has symptoms of Bleeding / Clotting Disorders - chronic DVT in both lower legs. Respiratory The patient has no complaints or symptoms. Cardiovascular Complains or has symptoms of LE edema. Denies complaints or symptoms of Chest pain. Gastrointestinal The patient has no complaints or symptoms. Endocrine The patient has no complaints or symptoms. Genitourinary The patient has no complaints or symptoms. Immunological The patient has no complaints or symptoms. Integumentary (Skin) The patient has no complaints or symptoms. Musculoskeletal The patient has no complaints or symptoms. Neurologic The patient has no complaints or symptoms. Oncologic The patient has no complaints or symptoms. Psychiatric Denies complaints or symptoms of Anxiety, Claustrophobia. Medications Coumadin 5 mg tablet oral 2.5 tablet oral on Monday and Friday; 2 tablets on Tuess, Wed, Thurs, Sat and Sun gabapentin 100 mg capsule oral 2 2 capsule oral three  times daily hydroxyzine HCl 25 mg tablet oral 1 1 tablet oral three times daily [name] [name], [name] A. ([id number]) albuterol sulfate HFA 90 mcg/actuation aerosol inhaler inhalation HFA aerosol inhaler inhalation Aquaphor topical ointment  topical ointment topical apply three times daily Eucerin topical cream topical cream topical apply once daily Lasix 20 mg tablet oral 1 1 tablet oral once daily trazodone 100 mg tablet oral 1 1 tablet oral once daily Cymbalta 60 mg capsule,delayed release oral 1 capsule,delayed release(DR/EC) oral once daily clobetasol 0.05 % topical cream topical cream topical apply daily lidocaine 4 % topical cream topical cream topical apply daily doxepin 25 mg capsule oral 1 1 capsule oral once daily Objective Constitutional Pulse regular. Respirations normal and unlabored. Afebrile. Vitals Time Taken: 11:13 AM, Height: 76 in, Source: Stated, Weight: 260 lbs, Source: Stated, BMI: 31.6, Temperature: 98.2 F, Pulse: 70 bpm, Respiratory Rate: 18 breaths/min, Blood Pressure: 125/90 mmHg. Eyes Nonicteric. Reactive to light. Ears, Nose, Mouth, and Throat Lips, teeth, and gums WNL.Marland Kitchen Moist mucosa without lesions . Neck supple and nontender. No palpable supraclavicular or cervical adenopathy. Normal sized without goiter. Respiratory WNL. No retractions.. Cardiovascular Pedal Pulses WNL his ABI on the right was 1.16 on the left was 0.86.. he had significant edema of the right lower extremity with findings of a post-thrombotic leg.. Chest Breasts symmetical and no nipple discharge.. Breast tissue WNL, no masses, lumps, or tenderness.. Gastrointestinal (GI) Abdomen without masses or tenderness.. No liver or spleen enlargement or tenderness.. Musculoskeletal Adexa without tenderness or enlargement.. Digits and nails w/o clubbing, cyanosis, infection, petechiae, ischemia, or inflammatory conditions.Marland Kitchen Psychiatric [name] [name], [name] A. ([id number]) Judgement and insight Intact.. No evidence of depression, anxiety, or agitation.. Integumentary (Hair, Skin) at the present time the patient does not have any open wounds of the right lower extremity but he has signs and symptoms of stasis dermatitis and post thrombotic  leg.Marland Kitchen No crepitus or fluctuance. No peri-wound warmth or erythema. No masses.. 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 on the left side he has signs and symptoms of claudication and has a ABI of 0.82. I have recommended that he sees a vascular surgeon after having an arterial duplex study done as he needs this to be looked at. He also needs an opinion from the vascular surgeons regarding his right lower extremity which does not have a DVT but does have venous insufficiency. The patient also has been counseled regarding smoking as he continues to smoke and continues to reduce his quota per day. He is now reformed alcoholic and does not do any street drugs. If he continues to have any problems with open ulcerations on his right lower extremity i have asked him to come back to see [name] but if not we will follow up with him on an as-needed basis. Plan Discharge From [facility] Services: Discharge from [place], [place] ([id number]) After reviewing the patient's lower extremities the right post thrombotic leg does not have any open ulcerations at the present time and at this stage I do not believe he needs an Unna's boot. on the left side he has signs and symptoms of claudication and has a ABI of 0.82. I have  recommended that he sees a vascular surgeon after having an arterial duplex study done as he needs this to be looked at. He also needs an opinion from the vascular surgeons regarding his right lower extremity which does not have a DVT but does have venous insufficiency. The patient also has  been counseled regarding smoking as he continues to smoke and continues to reduce his quota per day. He is now reformed alcoholic and does not do any street drugs. If he continues to have any problems with open ulcerations on his right lower extremity i have asked him to come back to see [name] but if not we will follow up with him on an as-needed basis. Electronic Signature(s) Signed: 12/24/2014 4:11:14 PM By: Christin Fudge MD, FACS Previous Signature: 12/24/2014 1:17:45 PM Version By: Christin Fudge MD, FACS Entered By: Christin Fudge on 12/24/2014 16:08:10 [name] [name], [name] [name] ([id number]) -------------------------------------------------------------------------------- ROS/PFSH Details Patient Name: [name] [name] Date of Service: 12/24/2014 10:15 AM Medical Record Number: [id number] Patient Account Number: [id number] Date of Birth/Sex: 1966-06-01 (49 y.o. Male) Treating RN: Montey Hora Primary Care Physician: AHMED, TASRIF Other Clinician: Referring Physician: Dellia Nims Treating Physician/Extender: Frann Rider in Treatment: 0 Information Obtained From Patient Wound History Do you currently have one or more open woundso Yes How many open wounds do you currently haveo 1 Approximately how long have you had your woundso 8 months How have you been treating your wound(s) until nowo unna boots Has your wound(s) ever healed and then re-openedo No Have you had any lab work done in the past montho No Have you tested positive for an antibiotic resistant organism (MRSA, VRE)o No Have you tested positive for osteomyelitis (bone infection)o No Have you had any tests for circulation on your legso Yes Who ordered the testo internal med at [facility] Where was the test [facility] hospital Have you had other problems associated with your woundso Swelling Hematologic/Lymphatic Complaints and Symptoms: Positive for: Bleeding / Clotting Disorders - chronic DVT in both lower legs Medical  History: Negative for: Anemia; Hemophilia; Human Immunodeficiency Virus; Lymphedema; Sickle Cell Disease Cardiovascular Complaints and Symptoms: Positive for: LE edema Negative for: Chest pain Medical History: Negative for: Angina; Arrhythmia; Congestive Heart Failure; Coronary Artery Disease; Deep Vein Thrombosis; Hypertension; Hypotension; Myocardial Infarction; Peripheral Arterial Disease; Peripheral Venous Disease; Phlebitis; Vasculitis Psychiatric Complaints and Symptoms: Negative for: Anxiety; Claustrophobia Medical History: Negative for: Anorexia/bulimia; Confinement Anxiety [name] [name], [name] A. ([id number]) Past Medical History Notes: depression Constitutional Symptoms (General Health) Complaints and Symptoms: No Complaints or Symptoms Eyes Complaints and Symptoms: No Complaints or Symptoms Medical History: Negative for: Cataracts; Glaucoma; Optic Neuritis Ear/Nose/Mouth/Throat Complaints and Symptoms: No Complaints or Symptoms Medical History: Negative for: Chronic sinus problems/congestion; Middle ear problems Respiratory Complaints and Symptoms: No Complaints or Symptoms Medical History: Positive for: Asthma Negative for: Aspiration; Chronic Obstructive Pulmonary Disease (COPD); Pneumothorax; Sleep Apnea; Tuberculosis Past Medical History Notes: hx bronchitis Gastrointestinal Complaints and Symptoms: No Complaints or Symptoms Medical History: Negative for: Cirrhosis ; Colitis; Crohnos; Hepatitis A; Hepatitis B; Hepatitis C Past Medical History Notes: GERD Endocrine Complaints and Symptoms: No Complaints or Symptoms Medical History: [name] [name], [name] A. ([id number]) Negative for: Type I Diabetes; Type II Diabetes Genitourinary Complaints and Symptoms: No Complaints or Symptoms Medical History: Negative for: End Stage Renal Disease Immunological Complaints and Symptoms: No Complaints or Symptoms Medical History: Negative for: Lupus Erythematosus; Raynaudos;  Scleroderma Integumentary (Skin) Complaints and Symptoms: No Complaints or Symptoms Medical History: Negative for: History of Burn; History of pressure wounds  Musculoskeletal Complaints and Symptoms: No Complaints or Symptoms Medical History: Negative for: Gout; Rheumatoid Arthritis; Osteoarthritis; Osteomyelitis Neurologic Complaints and Symptoms: No Complaints or Symptoms Medical History: Negative for: Dementia; Neuropathy; Quadriplegia; Paraplegia; Seizure Disorder Oncologic Complaints and Symptoms: No Complaints or Symptoms Medical History: Negative for: Received Chemotherapy; Received Radiation Family and Social History [name] [name], BRENNING. ([id number]) Cancer: Yes - Father, Paternal Grandparents; Diabetes: No; Heart Disease: No; Hereditary Spherocytosis: No; Hypertension: Yes - Mother, Maternal Grandparents; Kidney Disease: No; Lung Disease: No; Seizures: No; Stroke: No; Thyroid Problems: No; Tuberculosis: No; Current every day smoker; Alcohol Use: Never - hx ETOH abuse; Drug Use: Prior History; Caffeine Use: Daily; Financial Concerns: No; Food, Clothing or Shelter Needs: No; Support System Lacking: No; Transportation Concerns: No; Advanced Directives: No; Patient does not want information on Advanced Directives Physician Affirmation I have reviewed and agree with the above information. Electronic Signature(s) Signed: 12/24/2014 1:17:45 PM By: Christin Fudge MD, FACS Signed: 12/24/2014 5:00:03 PM By: Montey Hora Entered By: Christin Fudge on 12/24/2014 13:10:25 [place], Sedrick [name] Kitchen ([id number]) -------------------------------------------------------------------------------- SuperBill Details Patient Name: [name] [name] Date of Service: 12/24/2014 Medical Record Number: [id number] Patient Account Number: [id number] Date of Birth/Sex: 07-11-1965 (49 y.o. 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 I73.9 Peripheral vascular disease, unspecified F17.218 Nicotine dependence, cigarettes, with other nicotine-induced disorders Facility Procedures CPT4 Code Description: [id number] 99213 - WOUND CARE VISIT-LEV 3 EST PT Modifier: Quantity: 1 CPT4 Code Description: [id number]-SMOKING CESSATION 3-10MINS ICD-10 Description Diagnosis I87.001 Postthrombotic syndrome without complications of rig M41.583 Nicotine dependence, cigarettes, with other nicotine Modifier: ht lower extr -induced diso Quantity: 1 emity rders Physician Procedures CPT4 Code Description: [id number] - WC PHYS LEVEL 4 - NEW PT ICD-10 Description Diagnosis I87.001 Postthrombotic syndrome without complications of rig S31.5 Peripheral vascular disease, unspecified Modifier: ht lower extr Quantity: 1 emity CPT4 Code Description: 94585 92924- SMOKING CESSATION 3-10 MINS ICD-10 Description Diagnosis 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 ([id number]) Entered By: Montey Hora on 12/24/2014 13:16:19

## 2014-12-25 NOTE — Progress Notes (Addendum)
CLEVER, GERALDO ([id number]) Visit Report for 12/24/2014 Allergy List Details Patient Name: [name] [name]. Date of Service: 12/24/2014 10:15 AM Medical Record Number: [id number] Patient Account Number: [id number] Date of Birth/Sex: 08-Sep-1965 (49 y.o. Male) Treating RN: Montey Hora Primary Care Physician: Dellia Nims Other Clinician: Referring Physician: Dellia Nims Treating Physician/Extender: Frann Rider in Treatment: 0 Allergies Active Allergies Xarelto clindamycin Allergy Notes Electronic Signature(s) Signed: 12/24/2014 5:00:03 PM By: Montey Hora Entered By: Montey Hora on 12/24/2014 10:48:21 [place], Field AMarland Kitchen ([id number]) -------------------------------------------------------------------------------- Arrival Information Details Patient Name: [name] [name] Date of Service: 12/24/2014 10:15 AM Medical Record Number: [id number] Patient Account Number: [id number] Date of Birth/Sex: 01-Dec-1965 (49 y.o. Male) Treating RN: Montey Hora Primary Care Physician: Dellia Nims Other Clinician: Referring Physician: Dellia Nims Treating Physician/Extender: Frann Rider in Treatment: 0 Visit Information Patient Arrived: Ambulatory Arrival Time: 10:40 Accompanied By: self Transfer Assistance: None Patient Identification Verified: Yes Secondary Verification Process Yes Completed: Patient Has Alerts: Yes Patient Alerts: Patient on Blood Thinner coumadin ABI L 1.17 R 0.86 Electronic Signature(s) Signed: 12/25/2014 11:53:56 AM By: Montey Hora Previous Signature: 12/24/2014 5:00:03 PM Version By: Montey Hora Entered By: Montey Hora on 12/25/2014 11:53:56 [name] [name] ([id number]) -------------------------------------------------------------------------------- Clinic Level of Care Assessment Details Patient Name: [name] [name] Date of Service: 12/24/2014 10:15 AM Medical Record Number: [id number] Patient Account Number: [id number] Date of  Birth/Sex: 1965-10-27 (48 y.o. Male) Treating RN: Montey Hora Primary Care Physician: Dellia Nims Other Clinician: Referring Physician: Dellia Nims Treating Physician/Extender: Frann Rider in Treatment: 0 Clinic Level of Care Assessment Items TOOL 2 Quantity Score []  - Use when only an EandM is performed on the INITIAL visit 0 ASSESSMENTS - Nursing Assessment / Reassessment X - General Physical Exam (combine w/ comprehensive assessment (listed just 1 20 below) when performed on new pt. evals) X - Comprehensive Assessment (HX, ROS, Risk Assessments, Wounds Hx, etc.) 1 25 ASSESSMENTS - Wound and Skin Assessment / Reassessment []  - Simple Wound Assessment / Reassessment - one wound 0 []  - Complex Wound Assessment / Reassessment - multiple wounds 0 []  - Dermatologic / Skin Assessment (not related to wound area) 0 ASSESSMENTS - Ostomy and/or Continence Assessment and Care []  - Incontinence Assessment and Management 0 []  - Ostomy Care Assessment and Management (repouching, etc.) 0 PROCESS - Coordination of Care X - Simple Patient / Family Education for ongoing care 1 15 []  - Complex (extensive) Patient / Family Education for ongoing care 0 X - Staff obtains Programmer, systems, Records, Test Results / Process Orders 1 10 []  - Staff telephones HHA, Nursing Homes / Clarify orders / etc 0 []  - Routine Transfer to another Facility (non-emergent condition) 0 []  - Routine Hospital Admission (non-emergent condition) 0 X - New Admissions / Biomedical engineer / Ordering NPWT, Apligraf, etc. 1 15 []  - Emergency Hospital Admission (emergent condition) 0 X - Simple Discharge Coordination 1 10 Dinneen, [place] [name]. ([id number]) []  - Complex (extensive) Discharge Coordination 0 PROCESS - Special Needs []  - Pediatric / Minor Patient Management 0 []  - Isolation Patient Management 0 []  - Hearing / Language / Visual special needs 0 []  - Assessment of Community assistance (transportation, D/C  planning, etc.) 0 []  - Additional assistance / Altered mentation 0 []  - Support Surface(s) Assessment (bed, cushion, seat, etc.) 0 INTERVENTIONS - Wound Cleansing / Measurement []  - Wound Imaging (photographs - any number of wounds) 0 []  - Wound Tracing (instead of photographs) 0 []  - Simple Wound Measurement - one wound 0 []  -  Complex Wound Measurement - multiple wounds 0 []  - Simple Wound Cleansing - one wound 0 []  - Complex Wound Cleansing - multiple wounds 0 INTERVENTIONS - Wound Dressings []  - Small Wound Dressing one or multiple wounds 0 []  - Medium Wound Dressing one or multiple wounds 0 []  - Large Wound Dressing one or multiple wounds 0 []  - Application of Medications - injection 0 INTERVENTIONS - Miscellaneous []  - External ear exam 0 []  - Specimen Collection (cultures, biopsies, blood, body fluids, etc.) 0 []  - Specimen(s) / Culture(s) sent or taken to Lab for analysis 0 []  - Patient Transfer (multiple staff / Harrel Lemon Lift / Similar devices) 0 []  - Simple Staple / Suture removal (25 or less) 0 []  - Complex Staple / Suture removal (26 or more) 0 Kregel, Lucien [name]. ([id number]) []  - Hypo / Hyperglycemic Management (close monitor of Blood Glucose) 0 X - Ankle / Brachial Index (ABI) - do not check if billed separately 1 15 Has the patient been seen at the hospital within the last three years: Yes Total Score: 110 Level Of Care: New/Established - Level 3 Electronic Signature(s) Signed: 12/24/2014 1:16:08 PM By: Montey Hora Entered By: Montey Hora on 12/24/2014 13:16:06 Mobley, [name] Kitchen ([id number]) -------------------------------------------------------------------------------- Encounter Discharge Information Details Patient Name: [name] [name] Date of Service: 12/24/2014 10:15 AM Medical Record Number: [id number] Patient Account Number: [id number] Date of Birth/Sex: 1965-08-28 (49 y.o. Male) Treating RN: Primary Care Physician: Dellia Nims Other Clinician: Referring  Physician: Dellia Nims Treating Physician/Extender: Frann Rider in Treatment: 0 Encounter Discharge Information Items Discharge Pain Level: 0 Discharge Condition: Stable Ambulatory Status: Ambulatory Discharge Destination: Home Transportation: Private Auto Accompanied By: self Schedule Follow-up Appointment: No Medication Reconciliation completed and provided to Patient/Care No Birney Belshe: Provided on Clinical Summary of Care: 12/24/2014 Form Type Recipient Paper Patient [facility] Electronic Signature(s) Signed: 12/24/2014 1:16:41 PM By: Montey Hora Previous Signature: 12/24/2014 11:50:28 AM Version By: Ruthine Dose Entered By: Montey Hora on 12/24/2014 13:16:40 [place], [place] Kitchen ([id number]) -------------------------------------------------------------------------------- Lower Extremity Assessment Details Patient Name: [name] [name] Date of Service: 12/24/2014 10:15 AM Medical Record Number: [id number] Patient Account Number: [id number] Date of Birth/Sex: February 05, 1966 (48 y.o. Male) Treating RN: Montey Hora Primary Care Physician: Dellia Nims Other Clinician: Referring Physician: Dellia Nims Treating Physician/Extender: Frann Rider in Treatment: 0 Edema Assessment Assessed: [Left: No] [Right: No] E[Left: dema] [Right: :] Calf Left: Right: Point of Measurement: 39 cm From Medial Instep 40.3 cm 43.1 cm Ankle Left: Right: Point of Measurement: 11 cm From Medial Instep 25.3 cm 29.2 cm Vascular Assessment Pulses: Posterior Tibial Palpable: [Left:No] [Right:Yes] Doppler: [Left:Monophasic] [Right:Multiphasic] Dorsalis Pedis Palpable: [Left:No] [Right:Yes] Doppler: [Left:Monophasic] [Right:Multiphasic] Extremity colors, hair growth, and conditions: Extremity Color: [Left:Hyperpigmented] [Right:Hyperpigmented] Hair Growth on Extremity: [Left:Yes] [Right:Yes] Temperature of Extremity: [Left:Warm] [Right:Warm] Capillary Refill: [Left:< 3 seconds] [Right:<  3 seconds] Blood Pressure: Brachial: [Left:125] [Right:126] Dorsalis Pedis: [Left:Dorsalis Pedis: 146] Ankle: Posterior Tibial: 108 [Left:Posterior Tibial: 140 0.86] [Right:1.16] Toe Nail Assessment Left: Right: Thick: Yes Yes Discolored: No No Deformed: No No Improper Length and Hygiene: No No Badon, Jonael [name]. ([id number]) Electronic Signature(s) Signed: 12/24/2014 5:00:03 PM By: Montey Hora Entered By: Montey Hora on 12/24/2014 11:38:16 Bartram, Mazi AMarland Kitchen ([id number]) -------------------------------------------------------------------------------- Multi Wound Chart Details Patient Name: [name] [name] Date of Service: 12/24/2014 10:15 AM Medical Record Number: [id number] Patient Account Number: [id number] Date of Birth/Sex: 1966-04-24 (49 y.o. Male) Treating RN: Montey Hora Primary Care Physician: Dellia Nims Other Clinician: Referring Physician: Dellia Nims Treating Physician/Extender: Frann Rider  in Treatment: 0 Vital Signs Height(in): 76 Pulse(bpm): 70 Weight(lbs): 260 Blood Pressure 125/90 (mmHg): Body Mass Index(BMI): 32 Temperature(F): 98.2 Respiratory Rate 18 (breaths/min): Photos: [1:No Photos] [N/[name]:N/[name]] Wound Location: [1:Right Lower Leg - Medial] [N/[name]:N/[name]] Wounding Event: [1:Gradually Appeared] [N/[name]:N/[name]] Primary Etiology: [1:Venous Leg Ulcer] [N/[name]:N/[name]] Comorbid History: [1:Asthma] [N/[name]:N/[name]] Date Acquired: [1:12/07/2014] [N/[name]:N/[name]] Weeks of Treatment: [1:0] [N/[name]:N/[name]] Wound Status: [1:Open] [N/[name]:N/[name]] Measurements L x W x D 0.2x0.7x0.1 [N/[name]:N/[name]] (cm) Area (cm) : [1:0.11] [N/[name]:N/[name]] Volume (cm) : [1:0.011] [N/[name]:N/[name]] % Reduction in Area: [1:0.00%] [N/[name]:N/[name]] % Reduction in Volume: 0.00% [N/[name]:N/[name]] Classification: [1:Partial Thickness] [N/[name]:N/[name]] Exudate Amount: [1:Small] [N/[name]:N/[name]] Exudate Type: [1:Serous] [N/[name]:N/[name]] Exudate Color: [1:amber] [N/[name]:N/[name]] Wound Margin: [1:Flat and Intact] [N/[name]:N/[name]] Granulation Amount: [1:Large (67-100%)]  [N/[name]:N/[name]] Granulation Quality: [1:Red] [N/[name]:N/[name]] Necrotic Amount: [1:None Present (0%)] [N/[name]:N/[name]] Exposed Structures: [1:Fascia: No Fat: No Tendon: No Muscle: No Joint: No Bone: No Limited to Skin Breakdown] [N/[name]:N/[name]] Epithelialization: Small (1-33%) N/[name] N/[name] Periwound Skin Texture: Edema: No N/[name] N/[name] Excoriation: No Induration: No Callus: No Crepitus: No Fluctuance: No Friable: No Rash: No Scarring: No Periwound Skin Maceration: No N/[name] N/[name] Moisture: Moist: No Dry/Scaly: No Periwound Skin Color: Atrophie Blanche: No N/[name] N/[name] Cyanosis: No Ecchymosis: No Erythema: No Hemosiderin Staining: No Mottled: No Pallor: No Rubor: No Temperature: No Abnormality N/[name] N/[name] Tenderness on Yes N/[name] N/[name] Palpation: Wound Preparation: Ulcer Cleansing: Other: N/[name] N/[name] soap and water Topical Anesthetic Applied: Other: lidocaine 4% Treatment Notes Electronic Signature(s) Signed: 12/24/2014 5:00:03 PM By: Montey Hora Entered By: Montey Hora on 12/24/2014 11:34:48 Musgrave, Dorene Grebe ([id number]) -------------------------------------------------------------------------------- Patient/Caregiver Education Details Patient Name: [name] [name] Date of Service: 12/24/2014 10:15 AM Medical Record Number: [id number] Patient Account Number: [id number] Date of Birth/Gender: [birth year] (48 y.o. Male) Treating RN: Montey Hora Primary Care Physician: Dellia Nims Other Clinician: Referring Physician: Dellia Nims Treating Physician/Extender: Frann Rider in Treatment: 0 Education Assessment Education Provided To: Patient Education Topics Provided Venous: Handouts: Other: compression hose to right leg only Methods: Explain/Verbal Responses: State content correctly Electronic Signature(s) Signed: 12/24/2014 1:17:05 PM By: Montey Hora Entered By: Montey Hora on 12/24/2014 13:17:04 Rasch, Sione AMarland Kitchen  ([id number]) -------------------------------------------------------------------------------- Vitals Details Patient Name: [name] [name] Date of Service: 12/24/2014 10:15 AM Medical Record Number: [id number] Patient Account Number: [id number] Date of Birth/Sex: Dec 18, 1965 (49 y.o. Male) Treating RN: Montey Hora Primary Care Physician: AHMED, TASRIF Other Clinician: Referring Physician: Dellia Nims Treating Physician/Extender: Frann Rider in Treatment: 0 Vital Signs Time Taken: 11:13 Temperature (F): 98.2 Height (in): 76 Pulse (bpm): 70 Source: Stated Respiratory Rate (breaths/min): 18 Weight (lbs): 260 Blood Pressure (mmHg): 125/90 Source: Stated Reference Range: 80 - 120 mg / dl Body Mass Index (BMI): 31.6 Electronic Signature(s) Signed: 12/24/2014 5:00:03 PM By: Montey Hora Entered By: Montey Hora on 12/24/2014 11:13:42

## 2014-12-28 ENCOUNTER — Telehealth: Payer: Self-pay | Admitting: *Deleted

## 2014-12-28 NOTE — Telephone Encounter (Signed)
Pt called and asked if you could add to the letter you wrote for him during his last visit with you.  DSS told him you need to write that he was not able to work during the dates of hospitalization up to now.   If you have any questions his # is [phone]

## 2014-12-30 NOTE — Telephone Encounter (Signed)
Received another call from pt asking for paperwork. I talked with Dr Randell Patient and she will call pt for more information.

## 2014-12-31 ENCOUNTER — Encounter: Payer: Self-pay | Admitting: Pulmonary Disease

## 2015-01-04 ENCOUNTER — Ambulatory Visit (INDEPENDENT_AMBULATORY_CARE_PROVIDER_SITE_OTHER): Payer: Self-pay | Admitting: Pharmacist

## 2015-01-04 DIAGNOSIS — Z7901 Long term (current) use of anticoagulants: Secondary | ICD-10-CM

## 2015-01-04 DIAGNOSIS — I82509 Chronic embolism and thrombosis of unspecified deep veins of unspecified lower extremity: Secondary | ICD-10-CM

## 2015-01-04 LAB — POCT INR: INR: 2.9

## 2015-01-04 NOTE — Patient Instructions (Signed)
Patient instructed to take medications as defined in the Anti-coagulation Track section of this encounter.  Patient instructed to take today's dose.  Patient verbalized understanding of these instructions.    

## 2015-01-04 NOTE — Progress Notes (Signed)
Anti-Coagulation Progress Note  [name] [name] is a 49 y.o. male who is currently on an anti-coagulation regimen.    RECENT RESULTS: Recent results are below, the most recent result is correlated with a dose of 92.5 mg. per week: Lab Results  Component Value Date   INR 2.90 01/04/2015   INR 2.80 12/23/2014   INR 2.90 12/18/2014    ANTI-COAG DOSE: Anticoagulation Dose Instructions as of 01/04/2015      Dorene Grebe Tue Wed Thu Fri Sat   New Dose 12.5 mg 12.5 mg 12.5 mg 12.5 mg 12.5 mg 12.5 mg 12.5 mg       ANTICOAG SUMMARY: Anticoagulation Episode Summary    Current INR goal   Next INR check 01/18/2015  INR from last check 2.90 (01/04/2015)  Weekly max dose   Target end date   INR check location   Preferred lab   Send INR reminders to ANTICOAG IMP   Indications  Long-term (current) use of anticoagulants [Z79.01] DVT (Resolved) [I82.409] 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]        Comments       Anticoagulation Care Providers    Provider Role Specialty Phone number   Bartholomew Crews, MD  Internal Medicine [phone]      ANTICOAG TODAY: Anticoagulation Summary as of 01/04/2015    INR goal   Selected INR 2.90 (01/04/2015)  Next INR check 01/18/2015  Target end date    Indications  Long-term (current) use of anticoagulants [Z79.01] DVT (Resolved) [I82.409] 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  Patient instructed to take today's dose.  Patient verbalized understanding of these instructions.        FOLLOW-UP Return in 2 weeks (on 01/18/2015) for Follow up INR at 1030h.  Jorene Guest, III Pharm.D., CACP

## 2015-01-05 NOTE — Progress Notes (Signed)
I have reviewed anticoagulation note.  INR at high end of normal, coumadin decreased a bit.  Plan per Dr. Elie Confer.

## 2015-01-15 ENCOUNTER — Telehealth: Payer: Self-pay | Admitting: Pharmacist

## 2015-01-15 NOTE — Telephone Encounter (Signed)
Call to patient to confirm appointment for 01/18/15 at 10:30 lmtcb

## 2015-01-18 ENCOUNTER — Ambulatory Visit (INDEPENDENT_AMBULATORY_CARE_PROVIDER_SITE_OTHER): Payer: Self-pay | Admitting: Pharmacist

## 2015-01-18 DIAGNOSIS — I82509 Chronic embolism and thrombosis of unspecified deep veins of unspecified lower extremity: Secondary | ICD-10-CM

## 2015-01-18 DIAGNOSIS — Z7901 Long term (current) use of anticoagulants: Secondary | ICD-10-CM

## 2015-01-18 DIAGNOSIS — I87091 Postthrombotic syndrome with other complications of right lower extremity: Secondary | ICD-10-CM

## 2015-01-18 DIAGNOSIS — I2699 Other pulmonary embolism without acute cor pulmonale: Secondary | ICD-10-CM

## 2015-01-18 LAB — POCT INR: INR: 3

## 2015-01-18 MED ORDER — WARFARIN SODIUM 5 MG PO TABS: ORAL_TABLET | ORAL | Status: DC

## 2015-01-18 NOTE — Progress Notes (Signed)
Anti-Coagulation Progress Note  [name] [name] is a 49 y.o. male who is currently on an anti-coagulation regimen.    RECENT RESULTS: Recent results are below, the most recent result is correlated with a dose of 87.5 mg. per week: Lab Results  Component Value Date   INR 3.00 01/18/2015   INR 2.90 01/04/2015   INR 2.80 12/23/2014    ANTI-COAG DOSE: Anticoagulation Dose Instructions as of 01/18/2015      Dorene Grebe Tue Wed Thu Fri Sat   New Dose 12.5 mg 10 mg 12.5 mg 12.5 mg 12.5 mg 12.5 mg 12.5 mg       ANTICOAG SUMMARY: Anticoagulation Episode Summary    Current INR goal   Next INR check 02/08/2015  INR from last check 3.00 (01/18/2015)  Weekly max dose   Target end date   INR check location   Preferred lab   Send INR reminders to ANTICOAG IMP   Indications  Long-term (current) use of anticoagulants [Z79.01] DVT (Resolved) [I82.409] 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]        Comments       Anticoagulation Care Providers    Provider Role Specialty Phone number   Bartholomew Crews, MD  Internal Medicine [phone]      ANTICOAG TODAY: Anticoagulation Summary as of 01/18/2015    INR goal   Selected INR 3.00 (01/18/2015)  Next INR check 02/08/2015  Target end date    Indications  Long-term (current) use of anticoagulants [Z79.01] DVT (Resolved) [I82.409] 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  Patient instructed to take today's dose.  Patient verbalized understanding of these instructions.        FOLLOW-UP Return in 3 weeks (on 02/08/2015) for Follow up INR at 1030h.  Jorene Guest, III Pharm.D., CACP

## 2015-01-18 NOTE — Patient Instructions (Signed)
Patient instructed to take medications as defined in the Anti-coagulation Track section of this encounter.  Patient instructed to take today's dose.  Patient verbalized understanding of these instructions.    

## 2015-01-19 NOTE — Progress Notes (Signed)
I have reviewed the note by Dr. Elie Confer.  Patient is on anticoagulation for DVT.

## 2015-01-21 ENCOUNTER — Other Ambulatory Visit: Payer: Self-pay | Admitting: Internal Medicine

## 2015-01-21 MED ORDER — DULOXETINE HCL 60 MG PO CPEP: 60.0000 mg | ORAL_CAPSULE | Freq: Every day | ORAL | Status: DC

## 2015-01-21 MED ORDER — TRAZODONE HCL 100 MG PO TABS: 100.0000 mg | ORAL_TABLET | Freq: Every evening | ORAL | Status: DC | PRN

## 2015-01-21 MED ORDER — GABAPENTIN 100 MG PO CAPS: 200.0000 mg | ORAL_CAPSULE | Freq: Three times a day (TID) | ORAL | Status: DC

## 2015-01-21 MED ORDER — DOXEPIN HCL 25 MG PO CAPS: 25.0000 mg | ORAL_CAPSULE | Freq: Every evening | ORAL | Status: DC | PRN

## 2015-01-21 NOTE — Telephone Encounter (Deleted)
Call to patient to confirm appointment for 01/25/15 at 3:15 lmtcb

## 2015-01-21 NOTE — Telephone Encounter (Signed)
Charles Mix.

## 2015-01-22 ENCOUNTER — Telehealth: Payer: Self-pay | Admitting: *Deleted

## 2015-01-22 ENCOUNTER — Other Ambulatory Visit: Payer: Self-pay | Admitting: Internal Medicine

## 2015-01-22 NOTE — Telephone Encounter (Signed)
All 4 Rx called in to pharmacy.

## 2015-01-22 NOTE — Telephone Encounter (Signed)
I didn't realize they are "print only" with his pharmacy not allowing eprescribe. Could you please call them in?

## 2015-01-22 NOTE — Telephone Encounter (Signed)
[facility] called - unable to get doxepin for pt. Other meds pharmacy can use  Trazodone ( already called in to pharmacy today ),  Amitriptyline, nortriptyline   Or hydroxyzine. Hilda Blades Analis Distler RN 01/22/15 3:30PM

## 2015-01-25 ENCOUNTER — Ambulatory Visit (INDEPENDENT_AMBULATORY_CARE_PROVIDER_SITE_OTHER): Payer: Self-pay | Admitting: Internal Medicine

## 2015-01-25 ENCOUNTER — Encounter: Payer: Self-pay | Admitting: Internal Medicine

## 2015-01-25 VITALS — BP 116/80 | HR 80 | Temp 98.0°F | Wt 267.9 lb

## 2015-01-25 DIAGNOSIS — I5032 Chronic diastolic (congestive) heart failure: Secondary | ICD-10-CM

## 2015-01-25 DIAGNOSIS — J4540 Moderate persistent asthma, uncomplicated: Secondary | ICD-10-CM

## 2015-01-25 DIAGNOSIS — F1910 Other psychoactive substance abuse, uncomplicated: Secondary | ICD-10-CM

## 2015-01-25 DIAGNOSIS — Z7951 Long term (current) use of inhaled steroids: Secondary | ICD-10-CM

## 2015-01-25 DIAGNOSIS — F17210 Nicotine dependence, cigarettes, uncomplicated: Secondary | ICD-10-CM

## 2015-01-25 DIAGNOSIS — J45909 Unspecified asthma, uncomplicated: Secondary | ICD-10-CM

## 2015-01-25 DIAGNOSIS — F1410 Cocaine abuse, uncomplicated: Secondary | ICD-10-CM

## 2015-01-25 DIAGNOSIS — R600 Localized edema: Secondary | ICD-10-CM

## 2015-01-25 DIAGNOSIS — I872 Venous insufficiency (chronic) (peripheral): Secondary | ICD-10-CM

## 2015-01-25 MED ORDER — ALBUTEROL SULFATE HFA 108 (90 BASE) MCG/ACT IN AERS: 2.0000 | INHALATION_SPRAY | RESPIRATORY_TRACT | Status: DC | PRN

## 2015-01-25 MED ORDER — NICOTINE 7 MG/24HR TD PT24: 7.0000 mg | MEDICATED_PATCH | TRANSDERMAL | Status: DC

## 2015-01-25 MED ORDER — FLUTICASONE-SALMETEROL 250-50 MCG/DOSE IN AEPB: 1.0000 | INHALATION_SPRAY | Freq: Two times a day (BID) | RESPIRATORY_TRACT | Status: DC

## 2015-01-25 MED ORDER — FUROSEMIDE 40 MG PO TABS: 40.0000 mg | ORAL_TABLET | Freq: Every day | ORAL | Status: DC

## 2015-01-25 NOTE — Assessment & Plan Note (Signed)
Asthma - waking up every night with SOB and wheezing. States SOB is same lying flat and sitting up. Is not able to walk long distance because of leg pain, not particularly because of SOB. Has been using albuterol upto 7 times daily. Ran out of albuterol. Not taking anything else for asthma. Cont to smoke 7-8 cigarettes daily.    Asked to start taking Advair (available at [facility] program). Refilled albuterol - asked to use only as needed.  Exam is normal currently. No wheezing or sob. Ambulatory sat% is high 90's. Some SOB could also be from CHF (increased lasix to 40mg  daily).  If continues to have SOB and wheezing, may need CXR and evaluation for CHF causing SOB.

## 2015-01-25 NOTE — Progress Notes (Signed)
O2 sat on ambulation - 97%.

## 2015-01-25 NOTE — Assessment & Plan Note (Signed)
Increased lasix to 20mg  daily to 40mg  daily. Has some SOB and wheezing which is likely from his asthma in the setting of continuing tobacco use. But will see if has improvement of breathing with increased lasix. This may also help reduce the leg swelling and help with choric venous statis.

## 2015-01-25 NOTE — Progress Notes (Signed)
Internal Medicine Clinic Attending  Case discussed with Dr. Ahmed at the time of the visit.  We reviewed the resident's history and exam and pertinent patient test results.  I agree with the assessment, diagnosis, and plan of care documented in the resident's note. 

## 2015-01-25 NOTE — Patient Instructions (Signed)
Start using Advair everyday. Use albuterol as needed.  Stop smoking. Use the nicotine patch.  Use lasix 40mg  daily. Follow up in 3 months.

## 2015-01-25 NOTE — Assessment & Plan Note (Signed)
Gave script for nicotine patch. Continues to smoke 7-8 cigarettes a day despite asthma attacks.

## 2015-01-25 NOTE — Assessment & Plan Note (Signed)
Has chronic leg swelling b/l. He was treated with doxy on 11/2014 for right leg swelling thought to be from cellulitis. Has seen wound care after that who told hi mtaht since he doesn't ave any open wound, he doesn't need to see them. Uses compression stocking occasionally. States that he does try to keep legs elevated.   Asked to continue leg elevation, compression stocking. No abx for now.  No fever or signs of cellulitis currently. Asked to let us know if he has fever or worsening of swelling or discharge.

## 2015-01-25 NOTE — Progress Notes (Signed)
   Subjective:    Patient ID: [name] [name], male    DOB: 05-Oct-1965, 49 y.o.   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   Asthma - waking up every night with SOB and wheezing. Has been using albuterol upto 7 times daily. Ran out of albuterol. Not taking anything else for asthma. Cont to smoke 7-8 cigarettes daily.   Has chronic leg swelling b/l. No open wounds or drainage. Swelling and pain better with elevation. Has compression stocking.   Review of Systems  Constitutional: Negative for fever, chills and fatigue.  HENT: Negative for congestion and sore throat.   Eyes: Negative for photophobia and visual disturbance.  Respiratory: Positive for cough, shortness of breath and wheezing. Negative for chest tightness.   Cardiovascular: Positive for leg swelling. Negative for chest pain and palpitations.  Gastrointestinal: Negative for nausea, vomiting and abdominal distention.  Endocrine: Negative.   Genitourinary: Negative.   Musculoskeletal: Negative.   Allergic/Immunologic: Negative.   Neurological: Negative.   Psychiatric/Behavioral: Negative.        Objective:   Physical Exam  Constitutional: He appears well-developed and well-nourished. No distress.  HENT:  Head: Normocephalic and atraumatic.  Mouth/Throat: Oropharynx is clear and moist. No oropharyngeal exudate.  Eyes: Pupils are equal, round, and reactive to light. Right eye exhibits no discharge. Left eye exhibits no discharge.  Neck: Normal range of motion.  Cardiovascular: Normal rate, regular rhythm and normal heart sounds.  Exam reveals no gallop and no friction rub.   No murmur heard. Pulmonary/Chest: Effort normal and breath sounds normal. No respiratory distress. He has no wheezes. He has no rales. He exhibits no tenderness.  Abdominal: Soft. He exhibits no distension and no mass.    Musculoskeletal: Normal range of motion.  Trace pitting edema on both legs. Has venous stasis changes bilaterally. Both legs have slight edema. No open wounds or drainage. Good pulses b/l.   Skin: He is not diaphoretic.     Filed Vitals:   01/25/15 1528  BP: 116/80  Pulse: 80  Temp: 98 F (36.7 C)   Ambulatory sat 96% on room air.      Assessment & Plan:  See problem based a&p.

## 2015-01-26 NOTE — Telephone Encounter (Signed)
Talked with Dr Genene Churn about changing doxepin - [name] is unable to get - suggest for pharmacy to call psy doctor. Message left on [facility] ID recording.

## 2015-01-28 ENCOUNTER — Telehealth: Payer: Self-pay | Admitting: *Deleted

## 2015-01-28 NOTE — Telephone Encounter (Signed)
Pharm calls and ask if pt is supposed to get the abx from June filled? Also does he need lidocaine oint?

## 2015-01-29 NOTE — Telephone Encounter (Signed)
Which abx? If doxy, then no.  I don't know why he is using the lidocaine ointment. But sure, that's fine.

## 2015-02-03 NOTE — Telephone Encounter (Signed)
It was the doxy and the lido caine was for open wound which has healed

## 2015-02-08 ENCOUNTER — Encounter (HOSPITAL_COMMUNITY): Payer: Self-pay | Admitting: Emergency Medicine

## 2015-02-08 ENCOUNTER — Emergency Department (HOSPITAL_COMMUNITY)
Admission: EM | Admit: 2015-02-08 | Discharge: 2015-02-09 | Disposition: A | Discharge status: Home / Self Care | Payer: Self-pay | Attending: Emergency Medicine | Admitting: Emergency Medicine

## 2015-02-08 DIAGNOSIS — R011 Cardiac murmur, unspecified: Secondary | ICD-10-CM | POA: Insufficient documentation

## 2015-02-08 DIAGNOSIS — J45909 Unspecified asthma, uncomplicated: Secondary | ICD-10-CM | POA: Insufficient documentation

## 2015-02-08 DIAGNOSIS — Z7951 Long term (current) use of inhaled steroids: Secondary | ICD-10-CM | POA: Insufficient documentation

## 2015-02-08 DIAGNOSIS — Z8619 Personal history of other infectious and parasitic diseases: Secondary | ICD-10-CM | POA: Insufficient documentation

## 2015-02-08 DIAGNOSIS — R110 Nausea: Secondary | ICD-10-CM | POA: Insufficient documentation

## 2015-02-08 DIAGNOSIS — Z79899 Other long term (current) drug therapy: Secondary | ICD-10-CM | POA: Insufficient documentation

## 2015-02-08 DIAGNOSIS — Z8719 Personal history of other diseases of the digestive system: Secondary | ICD-10-CM | POA: Insufficient documentation

## 2015-02-08 DIAGNOSIS — Z86718 Personal history of other venous thrombosis and embolism: Secondary | ICD-10-CM | POA: Insufficient documentation

## 2015-02-08 DIAGNOSIS — Z86711 Personal history of pulmonary embolism: Secondary | ICD-10-CM | POA: Insufficient documentation

## 2015-02-08 DIAGNOSIS — Z720 Tobacco use: Secondary | ICD-10-CM | POA: Insufficient documentation

## 2015-02-08 DIAGNOSIS — M1990 Unspecified osteoarthritis, unspecified site: Secondary | ICD-10-CM | POA: Insufficient documentation

## 2015-02-08 DIAGNOSIS — F319 Bipolar disorder, unspecified: Secondary | ICD-10-CM | POA: Insufficient documentation

## 2015-02-08 DIAGNOSIS — Z872 Personal history of diseases of the skin and subcutaneous tissue: Secondary | ICD-10-CM | POA: Insufficient documentation

## 2015-02-08 DIAGNOSIS — R600 Localized edema: Secondary | ICD-10-CM | POA: Insufficient documentation

## 2015-02-08 LAB — COMPREHENSIVE METABOLIC PANEL
ALK PHOS: 69 U/L (ref 38–126)
ALT: 27 U/L (ref 17–63)
ANION GAP: 8 (ref 5–15)
AST: 22 U/L (ref 15–41)
Albumin: 3.8 g/dL (ref 3.5–5.0)
BUN: 11 mg/dL (ref 6–20)
CO2: 26 mmol/L (ref 22–32)
Calcium: 9.3 mg/dL (ref 8.9–10.3)
Chloride: 105 mmol/L (ref 101–111)
Creatinine, Ser: 1.12 mg/dL (ref 0.61–1.24)
GFR calc non Af Amer: >60 mL/min (ref >60)
GLUCOSE: 80 mg/dL (ref 65–99)
Potassium: 4.3 mmol/L (ref 3.5–5.1)
Sodium: 139 mmol/L (ref 135–145)
Total Bilirubin: 0.8 mg/dL (ref 0.3–1.2)
Total Protein: 6.8 g/dL (ref 6.5–8.1)

## 2015-02-08 LAB — CBC WITH DIFFERENTIAL/PLATELET
BASOS ABS: 0 10*3/uL (ref 0.0–0.1)
BASOS PCT: 1 % (ref 0–1)
Eosinophils Absolute: 0.2 10*3/uL (ref 0.0–0.7)
Eosinophils Relative: 4 % (ref 0–5)
HCT: 47.8 % (ref 39.0–52.0)
HEMOGLOBIN: 16.6 g/dL (ref 13.0–17.0)
Lymphocytes Relative: 37 % (ref 12–46)
Lymphs Abs: 2.1 10*3/uL (ref 0.7–4.0)
MCH: 31.4 pg (ref 26.0–34.0)
MCHC: 34.7 g/dL (ref 30.0–36.0)
MCV: 90.5 fL (ref 78.0–100.0)
MONO ABS: 0.5 10*3/uL (ref 0.1–1.0)
MONOS PCT: 9 % (ref 3–12)
NEUTROS PCT: 49 % (ref 43–77)
Neutro Abs: 2.8 10*3/uL (ref 1.7–7.7)
Platelets: 198 10*3/uL (ref 150–400)
RBC: 5.28 MIL/uL (ref 4.22–5.81)
RDW: 13.8 % (ref 11.5–15.5)
WBC: 5.6 10*3/uL (ref 4.0–10.5)

## 2015-02-08 LAB — PROTIME-INR
INR: 1.95 — AB (ref 0.00–1.49)
PROTHROMBIN TIME: 22.2 s — AB (ref 11.6–15.2)

## 2015-02-08 MED ORDER — ONDANSETRON HCL 4 MG/2ML IJ SOLN
4.0000 mg | Freq: Once | INTRAMUSCULAR | Status: AC
  Administered 2015-02-08: 4 mg via INTRAVENOUS
  Filled 2015-02-08: qty 2

## 2015-02-08 MED ORDER — OXYCODONE-ACETAMINOPHEN 5-325 MG PO TABS
1.0000 | ORAL_TABLET | Freq: Once | ORAL | Status: AC
  Administered 2015-02-08: 1 via ORAL
  Filled 2015-02-08: qty 1

## 2015-02-08 MED ORDER — SODIUM CHLORIDE 0.9 % IV BOLUS (SEPSIS)
1000.0000 mL | Freq: Once | INTRAVENOUS | Status: AC
  Administered 2015-02-08: 1000 mL via INTRAVENOUS

## 2015-02-08 MED ORDER — MORPHINE SULFATE 4 MG/ML IJ SOLN
6.0000 mg | Freq: Once | INTRAMUSCULAR | Status: AC
  Administered 2015-02-08: 6 mg via INTRAVENOUS
  Filled 2015-02-08: qty 2
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
## 2015-02-08 NOTE — ED Notes (Signed)
Pt. presents with multiple complaints : Nausea onset last night , dizziness/lightheaded this morning , and right lower leg cellulitis with pain and itching onset last night . Denies fever or chills. Respirations unlabored.

## 2015-02-08 NOTE — ED Provider Notes (Signed)
Medical screening examination/treatment/procedure(s) were conducted as a shared visit with non-physician practitioner(s) and myself.  I personally evaluated the patient during the encounter.  49 year old male presents with right leg pain, being anticoagulated on Coumadin for a DVT. Has chronic changes to the right leg but no erythema, warmth, signs of active infection. No leukocytosis or fever currently. Plan for adjusting her dose of Coumadin as he is slightly subtherapeutic today, has follow-up in outpatient clinic already arranged.   EKG Interpretation None        Leo Grosser, MD 02/08/15 [phone]

## 2015-02-09 NOTE — Discharge Instructions (Signed)
Your coumadin level is 1.95 today.  Please double your coumadin dose tomorrow and then resume normal dosing. Follow up with your doctor promptly for further care.  Edema Edema is an abnormal buildup of fluids in your bodytissues. Edema is somewhatdependent on gravity to pull the fluid to the lowest place in your body. That makes the condition more common in the legs and thighs (lower extremities). Painless swelling of the feet and ankles is common and becomes more likely as you get older. It is also common in looser tissues, like around your eyes.  When the affected area is squeezed, the fluid may move out of that spot and leave a dent for a few moments. This dent is called pitting.  CAUSES  There are many possible causes of edema. Eating too much salt and being on your feet or sitting for a long time can cause edema in your legs and ankles. Hot weather may make edema worse. Common medical causes of edema include:  Heart failure.  Liver disease.  Kidney disease.  Weak blood vessels in your legs.  Cancer.  An injury.  Pregnancy.  Some medications.  Obesity. SYMPTOMS  Edema is usually painless.Your skin may look swollen or shiny.  DIAGNOSIS  Your health care provider may be able to diagnose edema by asking about your medical history and doing a physical exam. You may need to have tests such as X-rays, an electrocardiogram, or blood tests to check for medical conditions that may cause edema.  TREATMENT  Edema treatment depends on the cause. If you have heart, liver, or kidney disease, you need the treatment appropriate for these conditions. General treatment may include:  Elevation of the affected body part above the level of your heart.  Compression of the affected body part. Pressure from elastic bandages or support stockings squeezes the tissues and forces fluid back into the blood vessels. This keeps fluid from entering the tissues.  Restriction of fluid and salt  intake.  Use of a water pill (diuretic). These medications are appropriate only for some types of edema. They pull fluid out of your body and make you urinate more often. This gets rid of fluid and reduces swelling, but diuretics can have side effects. Only use diuretics as directed by your health care provider. HOME CARE INSTRUCTIONS   Keep the affected body part above the level of your heart when you are lying down.   Do not sit still or stand for prolonged periods.   Do not put anything directly under your knees when lying down.  Do not wear constricting clothing or garters on your upper legs.   Exercise your legs to work the fluid back into your blood vessels. This may help the swelling go down.   Wear elastic bandages or support stockings to reduce ankle swelling as directed by your health care provider.   Eat a low-salt diet to reduce fluid if your health care provider recommends it.   Only take medicines as directed by your health care provider. SEEK MEDICAL CARE IF:   Your edema is not responding to treatment.  You have heart, liver, or kidney disease and notice symptoms of edema.  You have edema in your legs that does not improve after elevating them.   You have sudden and unexplained weight gain. SEEK IMMEDIATE MEDICAL CARE IF:   You develop shortness of breath or chest pain.   You cannot breathe when you lie down.  You develop pain, redness, or warmth in the swollen  areas.   You have heart, liver, or kidney disease and suddenly get edema.  You have a fever and your symptoms suddenly get worse. MAKE SURE YOU:   Understand these instructions.  Will watch your condition.  Will get help right away if you are not doing well or get worse. Document Released: 06/26/2005 Document Revised: 11/10/2013 Document Reviewed: 04/18/2013 [facility] Patient Information 2015 [place], [place]. This information is not intended to replace advice given to you by your health  care provider. Make sure you discuss any questions you have with your health care provider.

## 2015-02-10 ENCOUNTER — Ambulatory Visit: Payer: Self-pay | Admitting: Student-PharmD

## 2015-02-10 ENCOUNTER — Ambulatory Visit (INDEPENDENT_AMBULATORY_CARE_PROVIDER_SITE_OTHER): Payer: Self-pay | Admitting: Internal Medicine

## 2015-02-10 ENCOUNTER — Ambulatory Visit (HOSPITAL_COMMUNITY)
Admission: RE | Admit: 2015-02-10 | Discharge: 2015-02-10 | Disposition: A | Discharge status: Home / Self Care | Payer: Self-pay | Source: Ambulatory Visit | Attending: Internal Medicine | Admitting: Internal Medicine

## 2015-02-10 VITALS — BP 128/82 | HR 61 | Temp 98.0°F | Wt 264.2 lb

## 2015-02-10 DIAGNOSIS — I82401 Acute embolism and thrombosis of unspecified deep veins of right lower extremity: Secondary | ICD-10-CM

## 2015-02-10 DIAGNOSIS — I82531 Chronic embolism and thrombosis of right popliteal vein: Secondary | ICD-10-CM

## 2015-02-10 DIAGNOSIS — M79604 Pain in right leg: Secondary | ICD-10-CM

## 2015-02-10 DIAGNOSIS — G4700 Insomnia, unspecified: Secondary | ICD-10-CM

## 2015-02-10 DIAGNOSIS — Z7951 Long term (current) use of inhaled steroids: Secondary | ICD-10-CM

## 2015-02-10 DIAGNOSIS — Z7901 Long term (current) use of anticoagulants: Secondary | ICD-10-CM

## 2015-02-10 DIAGNOSIS — L309 Dermatitis, unspecified: Secondary | ICD-10-CM

## 2015-02-10 DIAGNOSIS — I82501 Chronic embolism and thrombosis of unspecified deep veins of right lower extremity: Secondary | ICD-10-CM

## 2015-02-10 DIAGNOSIS — I82511 Chronic embolism and thrombosis of right femoral vein: Secondary | ICD-10-CM

## 2015-02-10 DIAGNOSIS — J45909 Unspecified asthma, uncomplicated: Secondary | ICD-10-CM

## 2015-02-10 DIAGNOSIS — N644 Mastodynia: Secondary | ICD-10-CM

## 2015-02-10 LAB — POCT INR: INR: 2.3

## 2015-02-10 MED ORDER — GABAPENTIN 100 MG PO CAPS: 200.0000 mg | ORAL_CAPSULE | Freq: Three times a day (TID) | ORAL | Status: DC

## 2015-02-10 MED ORDER — TRAZODONE HCL 100 MG PO TABS: 100.0000 mg | ORAL_TABLET | Freq: Every evening | ORAL | Status: DC | PRN

## 2015-02-10 MED ORDER — HYDROXYZINE HCL 25 MG PO TABS: 25.0000 mg | ORAL_TABLET | Freq: Three times a day (TID) | ORAL | Status: DC | PRN

## 2015-02-10 MED ORDER — LIDOCAINE 5 % EX OINT: 1.0000 "application " | TOPICAL_OINTMENT | Freq: Every day | CUTANEOUS | Status: DC

## 2015-02-10 MED ORDER — CLOBETASOL PROPIONATE 0.05 % EX CREA: 1.0000 "application " | TOPICAL_CREAM | Freq: Every day | CUTANEOUS | Status: DC

## 2015-02-10 NOTE — Progress Notes (Addendum)
Anticoagulation Management [name] [name] is a 49 y.o. male who reports to the clinic for monitoring of warfarin treatment.    Indication: DVTand bilateral PE Duration: indefinite  Anticoagulation Clinic Visit History: Anticoagulation Episode Summary    Current INR goal   Next INR check 02/24/2015  INR from last check 2.3 (02/10/15)  Weekly max dose   Target end date   INR check location   Preferred lab   Send INR reminders to [facility]   Indications  Long-term (current) use of anticoagulants [Z79.01] DVT (Resolved) [I82.409] 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]        Comments       Anticoagulation Care Providers    Provider Role Specialty Phone number   Bartholomew Crews, MD  Internal Medicine [phone]     ASSESSMENT Recent Results: Recent results are below, the most recent result is correlated with a dose of 85 mg per week: Lab Results  Component Value Date   INR 2.3 02/10/2015   INR 3.00 01/18/2015   INR 2.90 01/04/2015    INR today: Therapeutic, however patient reports receiving an "extra dose" of warfarin during ED visit on 02/08/15 because INR was 1.95, so will increase dose as shown below.  Anticoagulation Dosing: INR as of 02/10/2015 and Previous Dosing Information    INR Dt INR Goal [facility] Sun Mon Tue Wed Thu Fri Sat   02/08/2015 2.3 - 85 mg 12.5 mg 10 mg 12.5 mg 12.5 mg 12.5 mg 12.5 mg 12.5 mg    Anticoagulation Dose Instructions as of 02/10/2015      Total Sun Mon Tue Wed Thu Fri Sat   New Dose 87.5 mg 12.5 mg 12.5 mg 12.5 mg 12.5 mg 12.5 mg 12.5 mg 12.5 mg     (5 mg x 2.5)  (5 mg x 2.5)  (5 mg x 2.5)  (5 mg x 2.5)  (5 mg x 2.5)  (5 mg x 2.5)  (5 mg x 2.5)                           PLAN Weekly dose was increased by 3% to 87.5 mg per week  Patient Instructions  Patient educated about medication as defined in this encounter and verbalized understanding by repeating back instructions provided.   Follow-up Return in  about 2 weeks (around 02/24/2015) for Follow up INR for 02/24/15 at 11 am.  Flossie Dibble Clinical Pharmacist  15 minutes spent face-to-face with the patient during the encounter. 50% of time spent on education. 50% of time was spent on assessment and plan.

## 2015-02-10 NOTE — Progress Notes (Signed)
*  PRELIMINARY RESULTS* Vascular Ultrasound Right lower extremity venous duplex has been completed.  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 Appears unchanged from studies on 07/24/14, 11/06/14, and Dec 03, 2014. Patient has no new symptoms.  Called results to referring MD.   Landry Mellow, RDMS, RVT  02/10/2015, 1:29 PM

## 2015-02-10 NOTE — Patient Instructions (Signed)
Patient educated about medication as defined in this encounter and verbalized understanding by repeating back instructions provided.   

## 2015-02-10 NOTE — Progress Notes (Signed)
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 Following issues addressed with intern.  Right leg pain and swelling in the setting of sub therapeutic INR - Stat Doppler USG.  Sub therapeutic INR - Repeat today. Dr Maudie Mercury to meet patient today and advise.  Claudication while walking - on exam patch of cool skin posteriorly on right, hairless skin, difficult to palpate pulses, no necrosis or other skin changes in feet. ABIs to be ordered and followed up.   Leg pain - Patient wants gabapentin and lidocaine to be refilled.   Insomnia - Patient wants trazodone and doxepin (however doxeopin is not available with MAP and he will call us with an alternative that is available)  Bilateral nipple pain - 2 weeks duration, on exam no masses felt, no skin changes or discharge, tender nipples. If continues, or worsens would consider imaging and/or testosterone, FSH, LH etc levels.  Discussed with Dr Marlowe Sax. Madilyn Fireman MD MPH 02/10/2015 12:04 PM

## 2015-02-10 NOTE — Patient Instructions (Addendum)
[name] [name] it was nice meeting you today. We will inform you of any pending results. Please return for a follow up visit in 1 month.

## 2015-02-11 DIAGNOSIS — N644 Mastodynia: Secondary | ICD-10-CM | POA: Insufficient documentation

## 2015-02-11 DIAGNOSIS — G4700 Insomnia, unspecified: Secondary | ICD-10-CM | POA: Insufficient documentation

## 2015-02-11 NOTE — Assessment & Plan Note (Signed)
Patient complaining of a 2 week hx of isolated bilateral nipple pain. No pain in surrounding skin or muscles. Denies lifting weights or trauma to the area. Physical exam did not show any nipple discharge or skin changes. Nipples were tender to palpation bilaterally.  -Will reevaluate during next visit. If patient continues to have pain, will consider imaging and/ or testing levels of testosterone, LH, and FSH.

## 2015-02-11 NOTE — Assessment & Plan Note (Addendum)
Pt complaining of increased swelling and pain of RLE, worse with walking. His pain could either be 2/2 acute on chronic DVT in the setting of subtherapeutic INR or it could be due to claudication since it is worse with walking. Physical exam showed weak pulses in the RLE, cool patch of skin behind right calf, and hairless skin.  -Doppler of RLE ordered today and it was negative for a new DVT. Patient has chronic DVT of the R femoral and popliteal veins.  -INR rechecked, f/u results. Dr. Maudie Mercury to meet patient at adjust his INR dose  -refilled Gabapentin and Lidocaine for pain  -f/u ABI to r/o claudication

## 2015-02-11 NOTE — Progress Notes (Signed)
Patient ID: [name] [name], male   DOB: 23-May-1966, 49 y.o.   MRN: [id number]   Subjective:   Patient ID: [name] [name] male   DOB: 10/17/1965 49 y.o.   MRN: [id number]  HPI: [name] [name] is a 49 y.o. with a PMHx of R leg DVT currently on Coumadin and asthma here for a ED follow up visit. He went to the ED on 02/08/2015 with complaints of RLE pain. As per ED note, pt had chronic changes to R leg, no erythema, warmth, or signs of infection. He did not have fever or leukocytosis at the time. They deemed the pain to be acute on chronic and did not think it was worsening cellulitis. Patient was referred to the clinic for his INR to be rechecked. INR measured in the ED as subtherapeutic (1.95). Today patient states he has been having RLE pain for the past 8 months. His R leg DVT was 14 years ago and since then he has clots in his leg. The pain worsened on 7/31 - RLE more painful and swollen and pain worse with walking. This is why he decided to go to the ED. Patient also states he has been using his inhaler for asthma 6-7 times per day. He has been hospitalized for Asthma exacerbations 14-15 times in the past with the most recent being 2 years ago. States he has never been intubated. Denies having any wheezing or SOB at present.     Past Medical History  Diagnosis Date  . DVT (deep venous thrombosis)     on Coumadin Rx  . Substance abuse     crack, cocaine, last use 2007  . Tobacco abuse   . DVT (deep venous thrombosis)   . Bronchitis     last time 2 yrs ago  . Heart murmur   . Shortness of breath   . Tuberculosis     ' I test Positive "  . Asthma     uses Albuterol daily as needed  . Cough     smokers  . Arthritis     knees  . Joint pain   . GERD (gastroesophageal reflux disease)     only takes something about 2 times a yr  . Cellulitis   . Bipolar 1 disorder   . Alcohol abuse   . Pulmonary embolism 2014  . Chronic dermatitis     due to Xarelto Rx  . Depression   . GERD  (gastroesophageal reflux disease)    Current Outpatient Prescriptions  Medication Sig Dispense Refill  . albuterol (PROVENTIL HFA;VENTOLIN HFA) 108 (90 BASE) MCG/ACT inhaler Inhale 2 puffs into the lungs every 4 (four) hours as needed for wheezing or shortness of breath. 1 Inhaler 3  . clobetasol cream (TEMOVATE) 7.82 % Apply 1 application topically daily. 30 g 2  . DULoxetine (CYMBALTA) 60 MG capsule Take 1 capsule (60 mg total) by mouth daily. 30 capsule 0  . Fluticasone-Salmeterol (ADVAIR DISKUS) 250-50 MCG/DOSE AEPB Inhale 1 puff into the lungs 2 (two) times daily. 60 each 3  . furosemide (LASIX) 40 MG tablet Take 1 tablet (40 mg total) by mouth daily. 30 tablet 0  . gabapentin (NEURONTIN) 100 MG capsule Take 2 capsules (200 mg total) by mouth 3 (three) times daily. 9am, 4pm, 9pm 180 capsule 0  . hydrocerin (EUCERIN) CREA Apply 1 application topically daily. 454 g 0  . hydrOXYzine (ATARAX/VISTARIL) 25 MG tablet Take 1 tablet (25 mg total) by mouth 3 (three) times daily as  needed for itching. 30 tablet 0  . lidocaine (XYLOCAINE) 5 % ointment Apply 1 application topically daily. 35.44 g 2  . mineral oil-hydrophilic petrolatum (AQUAPHOR) ointment Apply 1 application topically 3 (three) times daily as needed for dry skin or irritation.    . traZODone (DESYREL) 100 MG tablet Take 1 tablet (100 mg total) by mouth at bedtime as needed for sleep. 14 tablet 0  . warfarin (COUMADIN) 5 MG tablet Take 2 1/2 tablets (12.5 mg) on all days of week EXCEPT on Mondays-- take 2 tablets (10 mg). (Patient taking differently: Take 10-12.5 mg by mouth daily at 6 PM. Take 2 1/2 tablets (12.5 mg) on all days of week EXCEPT on Mondays-- take 2 tablets (10 mg).) 68 tablet 2  . doxepin (SINEQUAN) 25 MG capsule Take 1 capsule (25 mg total) by mouth at bedtime as needed (sleep). (Patient not taking: Reported on 02/10/2015) 30 capsule 0  . doxycycline (VIBRA-TABS) 100 MG tablet Take 1 tablet (100 mg total) by mouth every 12  (twelve) hours. (Patient not taking: Reported on 02/10/2015) 14 tablet 0  . mupirocin nasal ointment (BACTROBAN) 2 % Use one-half of tube in each nostril twice daily for five (5) days. After application, press sides of nose together and gently massage. (Patient not taking: Reported on 02/10/2015) 10 g 0  . nicotine (NICODERM CQ - DOSED IN MG/24 HR) 7 mg/24hr patch Place 1 patch (7 mg total) onto the skin daily. (Patient not taking: Reported on 02/10/2015) 30 patch 3   No current facility-administered medications for this visit.   Family History  Problem Relation Age of Onset  . Asthma Mother   . Throat cancer Father    History   Social History  . Marital Status: Single    Spouse Name: N/A  . Number of Children: N/A  . Years of Education: N/A   Occupational History  . Details Cars    Social History Main Topics  . Smoking status: Current Every Day Smoker -- 0.25 packs/day for 25 years    Types: Cigarettes  . Smokeless tobacco: Former Systems developer     Comment: "stopped chewing in 1990s"  . Alcohol Use: No     Comment: last alcohol intake 2 months ago per pt  . Drug Use: No     Comment: hx of-last time was 2 months ago per pt  . Sexual Activity: Not on file   Other Topics Concern  . Not on file   Social History Narrative   Working at [employer] now unemployed.  States he has insurance through them now.    Lives with mom and step dad             Review of Systems: Review of Systems  Constitutional: Negative for fever and chills.  HENT: Negative for ear pain.   Eyes: Negative for blurred vision and pain.  Respiratory: Negative for cough, sputum production, shortness of breath and wheezing.   Cardiovascular: Positive for claudication and leg swelling. Negative for chest pain.  Gastrointestinal: Negative for heartburn, vomiting and abdominal pain.  Genitourinary: Negative.   Musculoskeletal: Negative.   Skin: Negative for itching and rash.       B/l nipple pain    Neurological: Negative for dizziness, sensory change, focal weakness, weakness and headaches.     Objective:  Physical Exam: Filed Vitals:   02/10/15 1157  BP: 128/82  Pulse: 61  Temp: 98 F (36.7 C)  TempSrc: Oral  Weight: 264 lb 3.2 oz (  119.84 kg)  SpO2: 99%   Physical Exam  Constitutional: He is oriented to person, place, and time. He appears well-developed and well-nourished.  HENT:  Head: Normocephalic and atraumatic.  Eyes: EOM are normal. Pupils are equal, round, and reactive to light.  Neck: Normal range of motion. Neck supple. No tracheal deviation present.  Cardiovascular: Normal rate, regular rhythm and intact distal pulses.   Pulmonary/Chest: Effort normal and breath sounds normal. No respiratory distress. He has no wheezes. He has no rales.  Abdominal: Soft. Bowel sounds are normal. He exhibits no distension. There is no tenderness.  Musculoskeletal: He exhibits edema.  RLE: patch of cool skin behind right calf, hairless skin, weak dorsalis pedis pulse, difficult to appreciate post tibial pulse. Homan's sign negative.   Neurological: He is alert and oriented to person, place, and time.  Skin: Skin is warm and dry.     Assessment & Plan:

## 2015-02-11 NOTE — Assessment & Plan Note (Signed)
Patient wants both Trazodone and Doxepin. However, Doxepin is not available with [facility]. Pt will speak to them and call us with an alternative that is available at that pharmacy.

## 2015-02-11 NOTE — Assessment & Plan Note (Signed)
No present complaints. Refilled Clobetasol today.

## 2015-02-11 NOTE — Assessment & Plan Note (Addendum)
Patient states he is using his inhaler 6-7 times per day. Denies any wheezing or SOB at present. States he started using Advair last week. -will follow up at next visit since it will take some time for Advair to show its therapeutic effects. Consider increasing dose of Advair if symptoms don't improve.  -Pt advised to contact us if SOB/ wheezing worsens.

## 2015-02-22 ENCOUNTER — Other Ambulatory Visit: Payer: Self-pay | Admitting: Internal Medicine

## 2015-02-22 NOTE — Telephone Encounter (Signed)
Pt called requesting meds to be filled. Please call pt back. °

## 2015-02-22 NOTE — Telephone Encounter (Signed)
Called, no answer, lm for rtc 

## 2015-02-23 NOTE — Telephone Encounter (Signed)
Patient called again regarding his refills for his Trazodone and warfain medicine.   [facility]

## 2015-02-24 ENCOUNTER — Ambulatory Visit (INDEPENDENT_AMBULATORY_CARE_PROVIDER_SITE_OTHER): Payer: Self-pay | Admitting: Pharmacist

## 2015-02-24 ENCOUNTER — Ambulatory Visit (INDEPENDENT_AMBULATORY_CARE_PROVIDER_SITE_OTHER): Payer: Self-pay | Admitting: Internal Medicine

## 2015-02-24 VITALS — BP 119/77 | HR 60 | Temp 97.8°F | Ht 76.0 in | Wt 265.7 lb

## 2015-02-24 DIAGNOSIS — Z720 Tobacco use: Secondary | ICD-10-CM

## 2015-02-24 DIAGNOSIS — J4520 Mild intermittent asthma, uncomplicated: Secondary | ICD-10-CM

## 2015-02-24 DIAGNOSIS — Z7901 Long term (current) use of anticoagulants: Secondary | ICD-10-CM

## 2015-02-24 DIAGNOSIS — I82401 Acute embolism and thrombosis of unspecified deep veins of right lower extremity: Secondary | ICD-10-CM

## 2015-02-24 DIAGNOSIS — I8311 Varicose veins of right lower extremity with inflammation: Secondary | ICD-10-CM

## 2015-02-24 DIAGNOSIS — N644 Mastodynia: Secondary | ICD-10-CM

## 2015-02-24 DIAGNOSIS — I872 Venous insufficiency (chronic) (peripheral): Secondary | ICD-10-CM

## 2015-02-24 DIAGNOSIS — I8312 Varicose veins of left lower extremity with inflammation: Secondary | ICD-10-CM

## 2015-02-24 LAB — POCT INR: INR: 3.2

## 2015-02-24 MED ORDER — NICOTINE 14 MG/24HR TD PT24: 14.0000 mg | MEDICATED_PATCH | TRANSDERMAL | Status: DC

## 2015-02-24 MED ORDER — TRIAMCINOLONE ACETONIDE 0.5 % EX CREA: 1.0000 "application " | TOPICAL_CREAM | Freq: Every day | CUTANEOUS | Status: DC

## 2015-02-24 MED ORDER — TRAZODONE HCL 100 MG PO TABS: 100.0000 mg | ORAL_TABLET | Freq: Every evening | ORAL | Status: DC | PRN

## 2015-02-24 NOTE — Progress Notes (Signed)
Anticoagulation Management [name] [name] is a 49 y.o. male who reports to the clinic for monitoring of warfarin treatment.    Indication: DVT Duration: indefinite  Anticoagulation Clinic Visit History: Anticoagulation Episode Summary    Current INR goal   Next INR check 03/09/2015  INR from last check 3.2 (02/24/2015)  Weekly max dose   Target end date   INR check location   Preferred lab   Send INR reminders to [facility]   Indications  Long-term (current) use of anticoagulants [Z79.01] DVT (Resolved) [I82.409] Right leg DVT [I82.401]        Comments       Anticoagulation Care Providers    Provider Role Specialty Phone number   Bartholomew Crews, MD  Internal Medicine [phone]     ASSESSMENT Recent Results: Recent results are below, the most recent result is correlated with a dose of 87.5 mg per week: Lab Results  Component Value Date   INR 3.2 02/24/2015   INR 2.3 02/10/2015   INR 1.95* 02/08/2015    INR today: Supratherapeutic  Anticoagulation Dosing: INR as of 02/24/2015 and Previous Dosing Information    INR Dt INR Goal [name] Sun Mon Tue Wed Thu Fri Sat   02/24/2015 3.2 - 87.5 mg 12.5 mg 12.5 mg 12.5 mg 12.5 mg 12.5 mg 12.5 mg 12.5 mg    Anticoagulation Dose Instructions as of 02/24/2015      Total Sun Mon Tue Wed Thu Fri Sat   New Dose 85 mg 12.5 mg 12.5 mg 12.5 mg 10 mg 12.5 mg 12.5 mg 12.5 mg     (5 mg x 2.5)  (5 mg x 2.5)  (5 mg x 2.5)  (5 mg x 2)  (5 mg x 2.5)  (5 mg x 2.5)  (5 mg x 2.5)                           PLAN Weekly dose was decreased by 3% to 85 mg per week  Patient Instructions  Take 2.5 tablets on MT-RFSS and take only 2 tablets on Wednesday. Please, return to the clinic in 2 weeks for INR check. If you notice and signs of bleeding or have any questions, please contact the clinic.   Follow-up No Follow-up on file.  Flossie Dibble Clinical Pharmacist  15 minutes spent face-to-face with the patient during the  encounter. 50% of time spent on education. 50% of time was spent on testing INR and adjusting dose.

## 2015-02-24 NOTE — Telephone Encounter (Signed)
Rx called in to pharmacy. Pt aware by ID recording.

## 2015-02-24 NOTE — Patient Instructions (Addendum)
Please use Jobst compression stockings for pain in your legs.   Use Nicotine Patches 14 mg/ 24 hrs for 6 weeks. Return for a follow up visit in 6 weeks.

## 2015-02-24 NOTE — Patient Instructions (Signed)
Take 2.5 tablets on MT-RFSS and take only 2 tablets on Wednesday. Please, return to the clinic in 2 weeks for INR check. If you notice and signs of bleeding or have any questions, please contact the clinic.

## 2015-02-25 NOTE — Progress Notes (Signed)
Patient ID: [name] [name], male   DOB: [birth year], 49 y.o.   MRN: [id number]   Subjective:   Patient ID: [name] [name] male   DOB: [birth year] 49 y.o.   MRN: [id number]  HPI: [name] is a 49 y.o. with a PMHx of R leg DVT currently on Coumadin and asthma here for a follow up of his leg pain. Pt complaining of bilateral calf soreness and burning. In addition, he is having a cramping sensation in his feet. States the calf pain is present at both rest and with activity. Denies any fevers, chills, CP, or SOB. Denies noticing any increased swelling or changes in skin.     Past Medical History  Diagnosis Date  . DVT (deep venous thrombosis)     on Coumadin Rx  . Substance abuse     crack, cocaine, last use 2007  . Tobacco abuse   . DVT (deep venous thrombosis)   . Bronchitis     last time 2 yrs ago  . Heart murmur   . Shortness of breath   . Tuberculosis     ' I test Positive "  . Asthma     uses Albuterol daily as needed  . Cough     smokers  . Arthritis     knees  . Joint pain   . GERD (gastroesophageal reflux disease)     only takes something about 2 times a yr  . Cellulitis   . Bipolar 1 disorder   . Alcohol abuse   . Pulmonary embolism 2014  . Chronic dermatitis     due to Xarelto Rx  . Depression   . GERD (gastroesophageal reflux disease)    Current Outpatient Prescriptions  Medication Sig Dispense Refill  . albuterol (PROVENTIL HFA;VENTOLIN HFA) 108 (90 BASE) MCG/ACT inhaler Inhale 2 puffs into the lungs every 4 (four) hours as needed for wheezing or shortness of breath. 1 Inhaler 3  . doxepin (SINEQUAN) 25 MG capsule Take 1 capsule (25 mg total) by mouth at bedtime as needed (sleep). (Patient not taking: Reported on 02/10/2015) 30 capsule 0  . doxycycline (VIBRA-TABS) 100 MG tablet Take 1 tablet (100 mg total) by mouth every 12 (twelve) hours. (Patient not taking: Reported on 02/10/2015) 14 tablet 0  . DULoxetine (CYMBALTA) 60 MG capsule Take 1 capsule (60 mg total)  by mouth daily. 30 capsule 0  . Fluticasone-Salmeterol (ADVAIR DISKUS) 250-50 MCG/DOSE AEPB Inhale 1 puff into the lungs 2 (two) times daily. 60 each 3  . furosemide (LASIX) 40 MG tablet Take 1 tablet (40 mg total) by mouth daily. 30 tablet 0  . gabapentin (NEURONTIN) 100 MG capsule Take 2 capsules (200 mg total) by mouth 3 (three) times daily. 9am, 4pm, 9pm 180 capsule 0  . hydrocerin (EUCERIN) CREA Apply 1 application topically daily. 454 g 0  . hydrOXYzine (ATARAX/VISTARIL) 25 MG tablet Take 1 tablet (25 mg total) by mouth 3 (three) times daily as needed for itching. 30 tablet 0  . lidocaine (XYLOCAINE) 5 % ointment Apply 1 application topically daily. 35.44 g 2  . mineral oil-hydrophilic petrolatum (AQUAPHOR) ointment Apply 1 application topically 3 (three) times daily as needed for dry skin or irritation.    . mupirocin nasal ointment (BACTROBAN) 2 % Use one-half of tube in each nostril twice daily for five (5) days. After application, press sides of nose together and gently massage. (Patient not taking: Reported on 02/10/2015) 10 g 0  . nicotine (NICODERM CQ - DOSED  IN MG/24 HOURS) 14 mg/24hr patch Place 1 patch (14 mg total) onto the skin daily. 42 patch 0  . traZODone (DESYREL) 100 MG tablet Take 1 tablet (100 mg total) by mouth at bedtime as needed for sleep. 14 tablet 0  . triamcinolone cream (KENALOG) 0.5 % Apply 1 application topically daily. Apply to skin on legs once daily and wash hands after use. 30 g 1  . warfarin (COUMADIN) 5 MG tablet Take 2 1/2 tablets (12.5 mg) on all days of week EXCEPT on Mondays-- take 2 tablets (10 mg). (Patient taking differently: Take 10-12.5 mg by mouth daily at 6 PM. Take 2 1/2 tablets (12.5 mg) on all days of week EXCEPT on Mondays-- take 2 tablets (10 mg).) 68 tablet 2   No current facility-administered medications for this visit.   Family History  Problem Relation Age of Onset  . Asthma Mother   . Throat cancer Father    Social History   Social  History  . Marital Status: Single    Spouse Name: N/A  . Number of Children: N/A  . Years of Education: N/A   Occupational History  . Details Cars    Social History Main Topics  . Smoking status: Current Every Day Smoker -- 0.25 packs/day for 25 years    Types: Cigarettes  . Smokeless tobacco: Former Systems developer     Comment: "stopped chewing in 1990s"  . Alcohol Use: No     Comment: last alcohol intake 2 months ago per pt  . Drug Use: No     Comment: hx of-last time was 2 months ago per pt  . Sexual Activity: Not on file   Other Topics Concern  . Not on file   Social History Narrative   Working at [employer] now unemployed.  States he has insurance through them now.    Lives with mom and step dad             Review of Systems: Review of Systems  Constitutional: Negative for fever, chills and malaise/fatigue.  HENT: Negative for ear discharge.   Eyes: Negative for blurred vision and pain.  Respiratory: Negative for cough, shortness of breath and wheezing.   Cardiovascular: Positive for leg swelling. Negative for chest pain and palpitations.  Gastrointestinal: Negative for nausea, vomiting, abdominal pain, diarrhea and constipation.  Genitourinary: Negative.   Musculoskeletal:       B/l lower extremity pain  Skin: Negative for itching and rash.  Neurological: Negative for dizziness, sensory change, focal weakness and headaches.   Objective:  Physical Exam: Filed Vitals:   02/24/15 1358  BP: 119/77  Pulse: 60  Temp: 97.8 F (36.6 C)  TempSrc: Oral  Height: 6\' 4"  (1.93 m)  Weight: 265 lb 11.2 oz (120.521 kg)  SpO2: 98%   Physical Exam  Constitutional: He appears well-developed and well-nourished.  HENT:  Head: Normocephalic and atraumatic.  Eyes: EOM are normal. Pupils are equal, round, and reactive to light.  Neck: Neck supple. No tracheal deviation present.  Cardiovascular: Normal rate, regular rhythm and intact distal pulses.   Pulmonary/Chest:  Effort normal. No respiratory distress. He has no wheezes. He has no rales.  Abdominal: Soft. Bowel sounds are normal. He exhibits no distension. There is no tenderness.  Musculoskeletal: He exhibits edema and tenderness.  RLE: positive for swelling. Extremity is warm and distal pulses intact.   LLE: no edema. Extremity is warm and distal pulses intact.     Skin: Skin is warm and  dry. No rash noted. No erythema.  Stasis dermatitis in b/l LEs   Assessment & Plan:

## 2015-02-26 NOTE — Assessment & Plan Note (Addendum)
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 RLE continues to be swollen today, however, doppler on 02/11/15 did not show a new DVT. Today pt is complaining of b/l LE soreness and burning sensation. Physical exam showing skin changes from chronic venous stasis. [name] on 8/4 did show subcutaneous edema of calf and ankle in the RLE. In addition, it showed a reactive appearing R groin lymph node. Pt denies having any dysuria or penile discharge. No fevers or chills. No LN could be palpated on physical exam. -He has been advised to buy knee high Jobst stockings for chronic venous stasis. -Stasis dermatitis: [facility]. It was switched to Triamcinolone today.  -Advised smoking cessation and patient agreed.

## 2015-02-26 NOTE — Assessment & Plan Note (Signed)
Well controlled with Advair. Pt currently asymptomatic. Rescue inhaler only as needed.

## 2015-02-26 NOTE — Assessment & Plan Note (Signed)
Patient wants to quit and was started on nicotine patches today. Since he smokes 6-7 cigs per day: -Nicoderm 14 mg/ 24 hrs x 6 weeks -Prescribe 7 mg/24 hrs x 2 weeks at follow up visit.

## 2015-02-26 NOTE — Assessment & Plan Note (Signed)
Pt still has b/l nipple soreness. States there is no pain, but only tenderness when the nipples are palpated. No discharge appreciated on physical exam. No gynecomastia. The nipple soreness is not likely related to hormonal changes.  -Monitor for now.

## 2015-03-01 NOTE — Progress Notes (Signed)
Medicine attending: I personally interviewed and briefly examined this patient, and reviewed pertinent clinical laboratory and radiographic data  with resident physician Dr.Vasundrha Rathore and we discussed a   management plan.

## 2015-03-08 ENCOUNTER — Telehealth: Payer: Self-pay | Admitting: Pharmacist

## 2015-03-08 NOTE — Telephone Encounter (Signed)
Call to patient to confirm appointment for 03/09/15 at 10:00 lmtcb

## 2015-03-09 ENCOUNTER — Ambulatory Visit (INDEPENDENT_AMBULATORY_CARE_PROVIDER_SITE_OTHER): Payer: Self-pay | Admitting: Pharmacist

## 2015-03-09 DIAGNOSIS — I82401 Acute embolism and thrombosis of unspecified deep veins of right lower extremity: Secondary | ICD-10-CM

## 2015-03-09 DIAGNOSIS — Z7901 Long term (current) use of anticoagulants: Secondary | ICD-10-CM

## 2015-03-09 LAB — POCT INR: INR: 4.8

## 2015-03-09 NOTE — Progress Notes (Signed)
Indication: Recurrent deep venous thrombosis. Duration: Lifelong. INR: Above target. Agree with Dr. Julianne Rice assessment and plan.

## 2015-03-09 NOTE — Progress Notes (Addendum)
Anticoagulation Management [name] [name] is a 49 y.o. male who reports to the clinic for monitoring of warfarin treatment.    Indication: DVT Duration: indefinite  Anticoagulation Clinic Visit History: Anticoagulation Episode Summary    Current INR goal   Next INR check 03/16/2015  INR from last check 4.8 (03/09/2015)  Weekly max dose   Target end date   INR check location   Preferred lab   Send INR reminders to [facility]   Indications  Long-term (current) use of anticoagulants [Z79.01] DVT (Resolved) [I82.409] Right leg DVT [I82.401]        Comments       Anticoagulation Care Providers    Provider Role Specialty Phone number   Bartholomew Crews, MD  Internal Medicine [phone]     ASSESSMENT Recent Results: Recent results are below, the most recent result is correlated with a dose of 85 mg per week: Lab Results  Component Value Date   INR 4.8 03/09/2015   INR 3.2 02/24/2015   INR 2.3 02/10/2015   INR today: Supratherapeutic  Anticoagulation Dosing: INR as of 03/09/2015 and Previous Dosing Information    INR Dt INR Goal Early Tot Sun Mon Tue Wed Thu Fri Sat   03/09/2015 4.8 - 85 mg 12.5 mg 12.5 mg 12.5 mg 10 mg 12.5 mg 12.5 mg 12.5 mg    Anticoagulation Dose Instructions as of 03/09/2015      Total Sun Mon Tue Wed Thu Fri Sat   New Dose 57.5 mg 12.5 mg 12.5 mg 0 mg 0 mg 10 mg 12.5 mg 10 mg     (5 mg x 2.5)  (5 mg x 2.5)  -  -  (5 mg x 2)  (5 mg x 2.5)  (5 mg x 2)                           PLAN Change dose as listed above  Patient inquired about renewal of doxepin prescription. Contacted [facility] and found out they no longer carry doxepin. Patient stated he has not been taking the doxepin and is okay to discontinue the medication since no longer available at the pharmacy. Education provided about non-pharm approaches to insomnia.  Follow-up Return in about 1 week (around 03/16/2015) for Follow up INR on 03/16/15 at 10:30.  [name],[name]   Aromatherapy  sample has been provided to the patient. Essential oil given: lavendar   Qty: 1 drop Exp.Date: 03/09/2015  The patient has been instructed regarding safe and effective use of essential oils:  Gentle inhalation only  Do not swallow, apply to skin, or get near eyes  Avoid exposure to children, pets, or individuals who may be sensitive.  Patient verbalized understanding by repeating back concepts discussed and signed consent sheet for verification.  Samples prepared by  [name],[name] [facility] Approved Aromatherapy Clinician 11:42 AM 03/09/2015

## 2015-03-09 NOTE — Patient Instructions (Addendum)
Patient educated about medication as defined in this encounter and verbalized understanding by repeating back instructions provided.   Here are some non-medicines you can try.  For sleep:  . Avoid naps during the day so you can rest well at night.  . Avoid caffeine, nicotine, and alcohol close to bedtime.  Marland Kitchen Avoid large meals and strong foods like spicy dishes close to bedtime.  . Associate your bed with sleep. Avoid TV and computer right before bedtime. . Get about 10-20 minutes of sunlight each day.  . Exercise during the day. . Before bedtime, do something relaxing. Calm your thinking. . Sleep in a comfortable and relaxing area. Ideas include white noise, sound machine, and calm music.  For anxiety:   Avoid caffeine, nicotine and alcohol. These can cause more anxiety.  Treat yourself with some quiet "me" time for at least 15 minutes each day.  Do more things that you enjoy to lower stress. Here are some ideas: o Reading o Exercise o Deep breathing o Music o Yoga o Spending time with loved ones o Caring for a pet o Meditation o Massage therapy o Aromatherapy

## 2015-03-10 ENCOUNTER — Encounter: Payer: Self-pay | Admitting: Internal Medicine

## 2015-03-10 ENCOUNTER — Ambulatory Visit (INDEPENDENT_AMBULATORY_CARE_PROVIDER_SITE_OTHER): Payer: Self-pay | Admitting: Internal Medicine

## 2015-03-10 DIAGNOSIS — M792 Neuralgia and neuritis, unspecified: Secondary | ICD-10-CM

## 2015-03-10 DIAGNOSIS — Z86711 Personal history of pulmonary embolism: Secondary | ICD-10-CM

## 2015-03-10 DIAGNOSIS — I82511 Chronic embolism and thrombosis of right femoral vein: Secondary | ICD-10-CM

## 2015-03-10 DIAGNOSIS — I82401 Acute embolism and thrombosis of unspecified deep veins of right lower extremity: Secondary | ICD-10-CM

## 2015-03-10 DIAGNOSIS — G629 Polyneuropathy, unspecified: Secondary | ICD-10-CM

## 2015-03-10 DIAGNOSIS — M5416 Radiculopathy, lumbar region: Secondary | ICD-10-CM | POA: Insufficient documentation

## 2015-03-10 DIAGNOSIS — Z7901 Long term (current) use of anticoagulants: Secondary | ICD-10-CM

## 2015-03-10 DIAGNOSIS — I82531 Chronic embolism and thrombosis of right popliteal vein: Secondary | ICD-10-CM

## 2015-03-10 LAB — GLUCOSE, CAPILLARY
GLUCOSE-CAPILLARY: 93 mg/dL (ref 65–99)
Glucose-Capillary: 88 mg/dL (ref 65–99)

## 2015-03-10 LAB — POCT GLYCOSYLATED HEMOGLOBIN (HGB A1C): HEMOGLOBIN A1C: 5.7

## 2015-03-10 MED ORDER — GABAPENTIN 300 MG PO CAPS: 300.0000 mg | ORAL_CAPSULE | Freq: Three times a day (TID) | ORAL | Status: DC

## 2015-03-10 NOTE — Patient Instructions (Signed)
-   Use compression stockings every day - Increase gabapentin to 300 mg three times daily for pain control - Getting blood work today to look into cause of possible peripheral neuropathy  General Instructions:   Please bring your medicines with you each time you come to clinic.  Medicines may include prescription medications, over-the-counter medications, herbal remedies, eye drops, vitamins, or other pills.   Progress Toward Treatment Goals:  Treatment Goal 04/07/2013  Stop smoking smoking less    Self Care Goals & Plans:  Self Care Goal 03/10/2015  Manage my medications take my medicines as prescribed; bring my medications to every visit; refill my medications on time  Monitor my health -  Eat healthy foods drink diet soda or water instead of juice or soda; eat more vegetables; eat foods that are low in salt; eat baked foods instead of fried foods; eat fruit for snacks and desserts  Be physically active -  Stop smoking -    No flowsheet data found.   Care Management & Community Referrals:  Referral 01/02/2013  Referrals made for care management support none needed

## 2015-03-10 NOTE — Assessment & Plan Note (Signed)
Patient with reported increased swelling in his RLE. Appears to be a chronic issue. He notes improvement with compression stockings. His INR is supratherapeutic and he follows with Dr. Elie Confer weekly for INR checks. I do not suspect a new DVT or expansion of his chronic DVT as he just had a doppler study on 8/4 which showed no changes. Recommended patient to continue using compression stockings daily and increased his Gabapentin to 300 mg TID to help with pain control. I suspect he has some underlying peripheral neuropathy as well given his symptoms of burning/tingling in his bilateral feet (see separate note).

## 2015-03-10 NOTE — Progress Notes (Signed)
   Subjective:    Patient ID: [name] [name], male    DOB: [birth year], 49 y.o.   MRN: [id number]  HPI Mr. 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   He was seen in the [facility] on 8/19 for increased swelling and pain of his RLE. He had a LE doppler on 8/4 which showed persistence of his chronic right femoral and popliteal DVT. He was advised at his last appointment to try knee high compression stockings, which he has been doing. He notes the pain has continued to worsen. He describes having bilateral sharp, burning, "needle-like" pain in his feet. He states the pain in his right calf is more achy. His last INR was supratherapeutic at 4.8. He follows with Dr. Elie Confer in the coumadin clinic. He currently takes Gabapentin 200 mg TID which provides some relief of his pain.    Review of Systems General: Denies fever, chills, night sweats, changes in weight, changes in appetite HEENT: Denies headaches, ear pain, changes in vision, rhinorrhea, sore throat CV: Denies CP, palpitations, SOB, orthopnea Pulm: Denies SOB, cough, wheezing GI: Denies abdominal pain, nausea, vomiting, diarrhea, constipation, melena, hematochezia GU: Denies dysuria, hematuria, frequency Msk: Denies joint pains Neuro: Reports tinging/numbness/burning in bilateral feet. Denies weakness.  Skin: Denies rashes, bruising    Objective:   Physical Exam General: alert, sitting up in chair, NAD HEENT: [place]/AT, EOMI, sclera anicteric, mucus membranes moist CV: RRR, no m/g/r Pulm: CTA bilaterally, breaths non-labored Abd: BS+, soft, non-tender Ext: RLE appears more swollen than the left side. RLE has chronic venous stasis changes. No tenderness to palpation of bilateral lower extremities. Negative Homan's sign.  Neuro: alert and oriented x 3     Assessment & Plan:  Please refer to A&P documentation.

## 2015-03-10 NOTE — Addendum Note (Signed)
Addended by: Forde Dandy on: 03/10/2015 05:45 PM   Modules accepted: Orders, Medications

## 2015-03-10 NOTE — Assessment & Plan Note (Signed)
Patient's symptoms of bilateral sharp, tingling, burning pain in his feet seems more compatible with neuropathic pain than pain from his chronic DVT. He had a normal vitamin B12 in 2014, normal TSH in March 2016, and pre-diabetic range HbA1c (5.7) in 2012.  - Will recheck vitamin B12 and HbA1c levels to see if source for potential peripheral neuropathy - Increase Gabapentin to 300 mg TID for pain control

## 2015-03-11 LAB — VITAMIN B12: VITAMIN B 12: 493 pg/mL (ref 211–946)

## 2015-03-12 ENCOUNTER — Other Ambulatory Visit: Payer: Self-pay | Admitting: *Deleted

## 2015-03-14 MED ORDER — TRAZODONE HCL 100 MG PO TABS: 100.0000 mg | ORAL_TABLET | Freq: Every evening | ORAL | Status: DC | PRN

## 2015-03-14 MED ORDER — DULOXETINE HCL 60 MG PO CPEP: 60.0000 mg | ORAL_CAPSULE | Freq: Every day | ORAL | Status: DC

## 2015-03-14 MED ORDER — HYDROXYZINE HCL 25 MG PO TABS: 25.0000 mg | ORAL_TABLET | Freq: Three times a day (TID) | ORAL | Status: DC | PRN

## 2015-03-14 NOTE — Addendum Note (Signed)
Addended by: Dellia Nims on: 03/14/2015 01:15 PM   Modules accepted: Orders

## 2015-03-16 ENCOUNTER — Ambulatory Visit (INDEPENDENT_AMBULATORY_CARE_PROVIDER_SITE_OTHER): Payer: Self-pay | Admitting: Pharmacist

## 2015-03-16 ENCOUNTER — Telehealth: Payer: Self-pay | Admitting: Internal Medicine

## 2015-03-16 DIAGNOSIS — I82401 Acute embolism and thrombosis of unspecified deep veins of right lower extremity: Secondary | ICD-10-CM

## 2015-03-16 DIAGNOSIS — Z7901 Long term (current) use of anticoagulants: Secondary | ICD-10-CM

## 2015-03-16 LAB — POCT INR: INR: 2.1

## 2015-03-16 NOTE — Progress Notes (Signed)
Internal Medicine Clinic Attending  Case discussed with Dr. Rivet soon after the resident saw the patient.  We reviewed the resident's history and exam and pertinent patient test results.  I agree with the assessment, diagnosis, and plan of care documented in the resident's note.  

## 2015-03-16 NOTE — Patient Instructions (Signed)
Patient educated about medication as defined in this encounter and verbalized understanding by repeating back instructions provided.   

## 2015-03-16 NOTE — Progress Notes (Signed)
Anticoagulation Management [name] [name] is a 49 y.o. male who reports to the clinic for monitoring of warfarin treatment.    Indication: DVT and PE history Duration: indefinite  Anticoagulation Clinic Visit History: Anticoagulation Episode Summary    Current INR goal   Next INR check 03/23/2015  INR from last check   Weekly max dose   Target end date   INR check location   Preferred lab   Send INR reminders to [facility]   Indications  Long-term (current) use of anticoagulants [Z79.01] DVT (Resolved) [I82.409] Right leg DVT [I82.401]        Comments       Anticoagulation Care Providers    Provider Role Specialty Phone number   Bartholomew Crews, MD  Internal Medicine [phone]     ASSESSMENT Recent Results: Recent results are below, the most recent result is correlated with a dose of 57.5 mg per week: Lab Results  Component Value Date   INR 2.1 03/16/2015   INR 4.8 03/09/2015   INR 3.2 02/24/2015   INR today: Therapeutic  Anticoagulation Dosing: INR as of 03/16/2015 and Previous Dosing Information    INR Dt INR Goal Wkly Tot Sun Mon Tue Wed Thu Fri Sat     - 57.5 mg 12.5 mg 12.5 mg 0 mg 0 mg 10 mg 12.5 mg 10 mg    Anticoagulation Dose Instructions as of 03/16/2015      Total Sun Mon Tue Wed Thu Fri Sat   New Dose 77.5 mg 10 mg 10 mg 12.5 mg 10 mg 12.5 mg 10 mg 12.5 mg     (5 mg x 2)  (5 mg x 2)  (5 mg x 2.5)  (5 mg x 2)  (5 mg x 2.5)  (5 mg x 2)  (5 mg x 2.5)                           PLAN Weekly dose is being re-established due to history of supratherapeutic INR values on 85 mg/week. Changed to 77.5 mg/week.  Patient Instructions  Patient educated about medication as defined in this encounter and verbalized understanding by repeating back instructions provided.   Follow-up Return in about 1 week (around 03/23/2015) for Follow up INR on 03/23/15 at 10 am.  [name] [name],[name] [name]  15 minutes spent face-to-face with the patient during the encounter. 50%  of time spent on education. 50% of time was spent on assessment and plan.

## 2015-03-16 NOTE — Telephone Encounter (Signed)
[facility] requesting the nurse to call back regarding pt Rx.

## 2015-03-17 NOTE — Telephone Encounter (Signed)
Sent via fax 03/16/2015

## 2015-03-17 NOTE — Telephone Encounter (Signed)
Confirmed hd rec'd faxed scripts

## 2015-03-23 ENCOUNTER — Ambulatory Visit (INDEPENDENT_AMBULATORY_CARE_PROVIDER_SITE_OTHER): Payer: Self-pay | Admitting: Pharmacist

## 2015-03-23 DIAGNOSIS — I82401 Acute embolism and thrombosis of unspecified deep veins of right lower extremity: Secondary | ICD-10-CM

## 2015-03-23 DIAGNOSIS — Z7901 Long term (current) use of anticoagulants: Secondary | ICD-10-CM

## 2015-03-23 LAB — POCT INR: INR: 3

## 2015-03-23 NOTE — Patient Instructions (Signed)
Patient educated about medication as defined in this encounter and verbalized understanding by repeating back instructions provided.   

## 2015-03-23 NOTE — Progress Notes (Signed)
Indication: Recurrent unprovoked deep venous thrombosis. Duration: Lifelong. INR: At target. Agree with Dr. Julianne Rice assessment and plan.

## 2015-03-23 NOTE — Progress Notes (Signed)
Anticoagulation Management [name] [name] is a 49 y.o. male who reports to the clinic for monitoring of warfarin treatment.    Indication: DVT Duration: indefinite  Anticoagulation Clinic Visit History: Anticoagulation Episode Summary    Current INR goal   Next INR check 03/30/2015  INR from last check 3.0 (03/23/2015)  Weekly max dose   Target end date   INR check location   Preferred lab   Send INR reminders to [facility]   Indications  Long-term (current) use of anticoagulants [Z79.01] DVT (Resolved) [I82.409] Right leg DVT [I82.401]        Comments       Anticoagulation Care Providers    Provider Role Specialty Phone number   Bartholomew Crews, MD  Internal Medicine [phone]     ASSESSMENT Recent Results: Recent results are below, the most recent result is correlated with a dose of 77.5 mg per week: Lab Results  Component Value Date   INR 3.0 03/23/2015   INR 2.1 03/16/2015   INR 4.8 03/09/2015   INR today: Therapeutic, however, decreasing dose due to increase in INR from 2.1 over 1 week.  Anticoagulation Dosing: INR as of 03/23/2015 and Previous Dosing Information    INR Dt INR Goal [name] Sun Mon Tue Wed Thu Fri Sat   03/23/2015 3.0 - 77.5 mg 10 mg 10 mg 12.5 mg 10 mg 12.5 mg 10 mg 12.5 mg    Anticoagulation Dose Instructions as of 03/23/2015      Total Sun Mon Tue Wed Thu Fri Sat   New Dose 75 mg 10 mg 10 mg 10 mg 10 mg 12.5 mg 10 mg 12.5 mg     (5 mg x 2)  (5 mg x 2)  (5 mg x 2)  (5 mg x 2)  (5 mg x 2.5)  (5 mg x 2)  (5 mg x 2.5)                           PLAN Weekly dose was decreased by 3% to 75 mg per week  Patient Instructions  Patient educated about medication as defined in this encounter and verbalized understanding by repeating back instructions provided.    Follow-up Return in about 1 week (around 03/30/2015) for INR on 03/30/15 at 10am.  [name] [name],[name] [name]  15 minutes spent face-to-face with the patient during the encounter. 50%  of time spent on education. 50% of time was spent on assessment and plan.

## 2015-03-30 ENCOUNTER — Ambulatory Visit (INDEPENDENT_AMBULATORY_CARE_PROVIDER_SITE_OTHER): Payer: Self-pay | Admitting: Pharmacist

## 2015-03-30 DIAGNOSIS — I87091 Postthrombotic syndrome with other complications of right lower extremity: Secondary | ICD-10-CM

## 2015-03-30 DIAGNOSIS — I82401 Acute embolism and thrombosis of unspecified deep veins of right lower extremity: Secondary | ICD-10-CM

## 2015-03-30 DIAGNOSIS — I2699 Other pulmonary embolism without acute cor pulmonale: Secondary | ICD-10-CM

## 2015-03-30 DIAGNOSIS — Z7901 Long term (current) use of anticoagulants: Secondary | ICD-10-CM

## 2015-03-30 LAB — POCT INR: INR: 2.5

## 2015-03-30 MED ORDER — WARFARIN SODIUM 5 MG PO TABS: ORAL_TABLET | ORAL | Status: DC

## 2015-03-30 NOTE — Progress Notes (Signed)
Anticoagulation Management [name] [name] is a 49 y.o. male who reports to the clinic for monitoring of warfarin treatment.    Indication: history of DVTand bilateral PE Duration: indefinite  Anticoagulation Clinic Visit History: Anticoagulation Episode Summary    Current INR goal   Next INR check 04/13/2015  INR from last check 2.5 (03/30/2015)  Weekly max dose   Target end date   INR check location   Preferred lab   Send INR reminders to [facility]   Indications  Long-term (current) use of anticoagulants [Z79.01] DVT (Resolved) [I82.409] Right leg DVT [I82.401]        Comments       Anticoagulation Care Providers    Provider Role Specialty Phone number   Bartholomew Crews, MD  Internal Medicine [phone]     ASSESSMENT Recent Results: Recent results are below, the most recent result is correlated with a dose of 75 mg per week: Lab Results  Component Value Date   INR 2.5 03/30/2015   INR 3.0 03/23/2015   INR 2.1 03/16/2015    INR today: Therapeutic  Anticoagulation Dosing: INR as of 03/30/2015 and Previous Dosing Information    INR Dt INR Goal [name] Sun Mon Tue Wed Thu Fri Sat   03/30/2015 2.5 - 75 mg 10 mg 10 mg 10 mg 10 mg 12.5 mg 10 mg 12.5 mg    Anticoagulation Dose Instructions as of 03/30/2015      Total Sun Mon Tue Wed Thu Fri Sat   New Dose 75 mg 10 mg 10 mg 10 mg 10 mg 12.5 mg 10 mg 12.5 mg     (5 mg x 2)  (5 mg x 2)  (5 mg x 2)  (5 mg x 2)  (5 mg x 2.5)  (5 mg x 2)  (5 mg x 2.5)                           PLAN Weekly dose was unchanged. Encouraged smoking cessation. Patient states he has been cutting back but cannot afford NRT. Referred patient to [place] and advised patient to contact me if further assistance needed.  Patient Instructions  Patient educated about medication as defined in this encounter and verbalized understanding by repeating back instructions provided.   Smoking Cessation, Tips for Success If you are ready to  quit smoking, congratulations! You have chosen to help yourself be healthier. Cigarettes bring nicotine, tar, carbon monoxide, and other irritants into your body. Your lungs, heart, and blood vessels will be able to work better without these poisons. There are many different ways to quit smoking. Nicotine gum, nicotine patches, a nicotine inhaler, or nicotine nasal spray can help with physical craving. Hypnosis, support groups, and medicines help break the habit of smoking. WHAT THINGS CAN I DO TO MAKE QUITTING EASIER?  Here are some tips to help you quit for good:  Pick a date when you will quit smoking completely. Tell all of your friends and family about your plan to quit on that date.  Do not try to slowly cut down on the number of cigarettes you are smoking. Pick a quit date and quit smoking completely starting on that day.  Throw away all cigarettes.   Clean and remove all ashtrays from your home, work, and car.  On a card, write down your reasons for quitting. Carry the card with you and read it when you get the urge to smoke.  Cleanse  your body of nicotine. Drink enough water and fluids to keep your urine clear or pale yellow. Do this after quitting to flush the nicotine from your body.  Learn to predict your moods. Do not let a bad situation be your excuse to have a cigarette. Some situations in your life might tempt you into wanting a cigarette.  Never have "just one" cigarette. It leads to wanting another and another. Remind yourself of your decision to quit.  Change habits associated with smoking. If you smoked while driving or when feeling stressed, try other activities to replace smoking. Stand up when drinking your coffee. Brush your teeth after eating. Sit in a different chair when you read the paper. Avoid alcohol while trying to quit, and try to drink fewer caffeinated beverages. Alcohol and caffeine may urge you to smoke.  Avoid foods and drinks that can trigger a desire to  smoke, such as sugary or spicy foods and alcohol.  Ask people who smoke not to smoke around you.  Have something planned to do right after eating or having a cup of coffee. For example, plan to take a walk or exercise.  Try a relaxation exercise to calm you down and decrease your stress. Remember, you may be tense and nervous for the first 2 weeks after you quit, but this will pass.  Find new activities to keep your hands busy. Play with a pen, coin, or rubber band. Doodle or draw things on paper.  Brush your teeth right after eating. This will help cut down on the craving for the taste of tobacco after meals. You can also try mouthwash.   Use oral substitutes in place of cigarettes. Try using lemon drops, carrots, cinnamon sticks, or chewing gum. Keep them handy so they are available when you have the urge to smoke.  When you have the urge to smoke, try deep breathing.  Designate your home as a nonsmoking area.  If you are a heavy smoker, ask your health care provider about a prescription for nicotine chewing gum. It can ease your withdrawal from nicotine.  Reward yourself. Set aside the cigarette money you save and buy yourself something nice.  Look for support from others. Join a support group or smoking cessation program. Ask someone at home or at work to help you with your plan to quit smoking.  Always ask yourself, "Do I need this cigarette or is this just a reflex?" Tell yourself, "Today, I choose not to smoke," or "I do not want to smoke." You are reminding yourself of your decision to quit.  Do not replace cigarette smoking with electronic cigarettes (commonly called e-cigarettes). The safety of e-cigarettes is unknown, and some may contain harmful chemicals.  If you relapse, do not give up! Plan ahead and think about what you will do the next time you get the urge to smoke. HOW WILL I FEEL WHEN I QUIT SMOKING? You may have symptoms of withdrawal because your body is used to  nicotine (the addictive substance in cigarettes). You may crave cigarettes, be irritable, feel very hungry, cough often, get headaches, or have difficulty concentrating. The withdrawal symptoms are only temporary. They are strongest when you first quit but will go away within 10-14 days. When withdrawal symptoms occur, stay in control. Think about your reasons for quitting. Remind yourself that these are signs that your body is healing and getting used to being without cigarettes. Remember that withdrawal symptoms are easier to treat than the major diseases that smoking  can cause.  Even after the withdrawal is over, expect periodic urges to smoke. However, these cravings are generally short lived and will go away whether you smoke or not. Do not smoke! WHAT RESOURCES ARE AVAILABLE TO HELP ME QUIT SMOKING? Your health care provider can direct you to community resources or hospitals for support, which may include:  Group support.  Education.  Hypnosis.  Therapy. Document Released: 03/24/2004 Document Revised: 11/10/2013 Document Reviewed: 12/12/2012 [facility] Patient Information 2015 [place], [place]. This information is not intended to replace advice given to you by your health care provider. Make sure you discuss any questions you have with your health care provider.    Follow-up Return in about 2 weeks (around 04/13/2015) for Follow up INR on 04/13/15.  [name],[name]  20 minutes spent face-to-face with the patient during the encounter. 50% of time spent on education. 50% of time was spent on assessment and plan.

## 2015-03-30 NOTE — Patient Instructions (Addendum)
Patient educated about medication as defined in this encounter and verbalized understanding by repeating back instructions provided.   Smoking Cessation, Tips for Success If you are ready to quit smoking, congratulations! You have chosen to help yourself be healthier. Cigarettes bring nicotine, tar, carbon monoxide, and other irritants into your body. Your lungs, heart, and blood vessels will be able to work better without these poisons. There are many different ways to quit smoking. Nicotine gum, nicotine patches, a nicotine inhaler, or nicotine nasal spray can help with physical craving. Hypnosis, support groups, and medicines help break the habit of smoking. WHAT THINGS CAN I DO TO MAKE QUITTING EASIER?  Here are some tips to help you quit for good:  Pick a date when you will quit smoking completely. Tell all of your friends and family about your plan to quit on that date.  Do not try to slowly cut down on the number of cigarettes you are smoking. Pick a quit date and quit smoking completely starting on that day.  Throw away all cigarettes.   Clean and remove all ashtrays from your home, work, and car.  On a card, write down your reasons for quitting. Carry the card with you and read it when you get the urge to smoke.  Cleanse your body of nicotine. Drink enough water and fluids to keep your urine clear or pale yellow. Do this after quitting to flush the nicotine from your body.  Learn to predict your moods. Do not let a bad situation be your excuse to have a cigarette. Some situations in your life might tempt you into wanting a cigarette.  Never have "just one" cigarette. It leads to wanting another and another. Remind yourself of your decision to quit.  Change habits associated with smoking. If you smoked while driving or when feeling stressed, try other activities to replace smoking. Stand up when drinking your coffee. Brush your teeth after eating. Sit in a different chair when you  read the paper. Avoid alcohol while trying to quit, and try to drink fewer caffeinated beverages. Alcohol and caffeine may urge you to smoke.  Avoid foods and drinks that can trigger a desire to smoke, such as sugary or spicy foods and alcohol.  Ask people who smoke not to smoke around you.  Have something planned to do right after eating or having a cup of coffee. For example, plan to take a walk or exercise.  Try a relaxation exercise to calm you down and decrease your stress. Remember, you may be tense and nervous for the first 2 weeks after you quit, but this will pass.  Find new activities to keep your hands busy. Play with a pen, coin, or rubber band. Doodle or draw things on paper.  Brush your teeth right after eating. This will help cut down on the craving for the taste of tobacco after meals. You can also try mouthwash.   Use oral substitutes in place of cigarettes. Try using lemon drops, carrots, cinnamon sticks, or chewing gum. Keep them handy so they are available when you have the urge to smoke.  When you have the urge to smoke, try deep breathing.  Designate your home as a nonsmoking area.  If you are a heavy smoker, ask your health care provider about a prescription for nicotine chewing gum. It can ease your withdrawal from nicotine.  Reward yourself. Set aside the cigarette money you save and buy yourself something nice.  Look for support from others. Join a  support group or smoking cessation program. Ask someone at home or at work to help you with your plan to quit smoking.  Always ask yourself, "Do I need this cigarette or is this just a reflex?" Tell yourself, "Today, I choose not to smoke," or "I do not want to smoke." You are reminding yourself of your decision to quit.  Do not replace cigarette smoking with electronic cigarettes (commonly called e-cigarettes). The safety of e-cigarettes is unknown, and some may contain harmful chemicals.  If you relapse, do not  give up! Plan ahead and think about what you will do the next time you get the urge to smoke. HOW WILL I FEEL WHEN I QUIT SMOKING? You may have symptoms of withdrawal because your body is used to nicotine (the addictive substance in cigarettes). You may crave cigarettes, be irritable, feel very hungry, cough often, get headaches, or have difficulty concentrating. The withdrawal symptoms are only temporary. They are strongest when you first quit but will go away within 10-14 days. When withdrawal symptoms occur, stay in control. Think about your reasons for quitting. Remind yourself that these are signs that your body is healing and getting used to being without cigarettes. Remember that withdrawal symptoms are easier to treat than the major diseases that smoking can cause.  Even after the withdrawal is over, expect periodic urges to smoke. However, these cravings are generally short lived and will go away whether you smoke or not. Do not smoke! WHAT RESOURCES ARE AVAILABLE TO HELP ME QUIT SMOKING? Your health care provider can direct you to community resources or hospitals for support, which may include:  Group support.  Education.  Hypnosis.  Therapy. Document Released: 03/24/2004 Document Revised: 11/10/2013 Document Reviewed: 12/12/2012 [facility] Patient Information 2015 [place], [place]. This information is not intended to replace advice given to you by your health care provider. Make sure you discuss any questions you have with your health care provider.

## 2015-04-13 ENCOUNTER — Ambulatory Visit (INDEPENDENT_AMBULATORY_CARE_PROVIDER_SITE_OTHER): Payer: Self-pay | Admitting: Pharmacist

## 2015-04-13 DIAGNOSIS — I82401 Acute embolism and thrombosis of unspecified deep veins of right lower extremity: Secondary | ICD-10-CM

## 2015-04-13 DIAGNOSIS — Z7901 Long term (current) use of anticoagulants: Secondary | ICD-10-CM

## 2015-04-13 LAB — POCT INR: INR: 2.8

## 2015-04-13 MED ORDER — FLUTICASONE-SALMETEROL 500-50 MCG/DOSE IN AEPB: 1.0000 | INHALATION_SPRAY | Freq: Two times a day (BID) | RESPIRATORY_TRACT | Status: DC

## 2015-04-13 NOTE — Progress Notes (Signed)
Patient was seen in clinic by Kelsy Combs, PharmD, PGY1 pharmacy resident. I agree with the assessment and plan of care documented. 

## 2015-04-13 NOTE — Patient Instructions (Signed)
Call clinic if you experience any signs/symptoms of bleeding or clotting.  Please return to clinic for follow up INR on 05/11/15 at 10 am.

## 2015-04-13 NOTE — Progress Notes (Signed)
Anticoagulation Management [name] [name] is a 49 y.o. male who reports to the clinic for monitoring of warfarin treatment.    Indication: history of DVT and bilateral PE Duration: indefinite  Anticoagulation Clinic Visit History: Anticoagulation Episode Summary    Current INR goal 2.0-3.0  Next INR check 05/11/2015  INR from last check 2.8 (04/13/2015)  Weekly max dose   Target end date   INR check location   Preferred lab   Send INR reminders to [facility]   Indications  Long-term (current) use of anticoagulants [Z79.01] DVT (Resolved) [I82.409] Right leg DVT ([place]) [I82.401]        Comments       Anticoagulation Care Providers    Provider Role Specialty Phone number   Bartholomew Crews, MD  Internal Medicine [phone]     ASSESSMENT Recent Results: Recent results are below, the most recent result is correlated with a dose of 75 mg per week: Lab Results  Component Value Date   INR 2.8 04/13/2015   INR 2.5 03/30/2015   INR 3.0 03/23/2015   Pt recently started on Ibuprofen 800 mg s/p tooth extraction on 9/27.  Pt is aware of potential bleeding risk with NSAIDs and plans to d/c ibuprofen soon.  Pt denies s/sx of bleeding or clotting, changes to diet, missed/extra doses, or other new medications.    INR today: Therapeutic. Pt has been therapeutic at last 4 visits.  Anticoagulation Dosing: INR as of 04/13/2015 and Previous Dosing Information    INR Dt INR Goal Madilyn Fireman Sun Mon Tue Wed Thu Fri Sat   04/13/2015 2.8 - 75 mg 10 mg 10 mg 10 mg 10 mg 12.5 mg 10 mg 12.5 mg    Anticoagulation Dose Instructions as of 04/13/2015      Total Sun Mon Tue Wed Thu Fri Sat   New Dose 75 mg 10 mg 10 mg 10 mg 10 mg 12.5 mg 10 mg 12.5 mg     (5 mg x 2)  (5 mg x 2)  (5 mg x 2)  (5 mg x 2)  (5 mg x 2.5)  (5 mg x 2)  (5 mg x 2.5)                         Description        Continue Warfarin 12.5 mg on Thurs/Sat and 10 mg all other days.       PLAN Please continue current  regimen of 12.5 mg on Thurs/Sat and 10 mg all other days.  Will extend INR f/u to 4 weeks since pt therapeutic x 4.  Pt plans to d/c ibuprofen soon following tooth extraction on 9/27.   Pt Called [phone] and is being shipped Nicotine patches this week.   Increased pts Advair to 500-50 1 puff BID after pt had been taking Advair 250-50 2 puffs BID.  Discussed change with PCP.  F/U smoking cessation and asthma s/sx  at next visit  Patient Instructions  Call clinic if you experience any signs/symptoms of bleeding or clotting.  Please return to clinic for follow up INR on 05/11/15 at 10 am.     Follow-up Return in about 4 weeks (around 05/11/2015) for f/u INR on 05/11/15.  Bennye Alm, PharmD Pharmacy Resident [phone]  30 minutes spent face-to-face with the patient during the encounter. 50% of time spent on education. 50% of time was spent on assessment and plan.

## 2015-04-23 ENCOUNTER — Ambulatory Visit (INDEPENDENT_AMBULATORY_CARE_PROVIDER_SITE_OTHER): Payer: Self-pay | Admitting: Internal Medicine

## 2015-04-23 VITALS — BP 127/85 | HR 80 | Temp 98.7°F | Ht 76.0 in | Wt 251.0 lb

## 2015-04-23 DIAGNOSIS — Z0000 Encounter for general adult medical examination without abnormal findings: Secondary | ICD-10-CM

## 2015-04-23 DIAGNOSIS — G629 Polyneuropathy, unspecified: Secondary | ICD-10-CM

## 2015-04-23 DIAGNOSIS — I872 Venous insufficiency (chronic) (peripheral): Secondary | ICD-10-CM

## 2015-04-23 DIAGNOSIS — Z720 Tobacco use: Secondary | ICD-10-CM

## 2015-04-23 DIAGNOSIS — M792 Neuralgia and neuritis, unspecified: Secondary | ICD-10-CM

## 2015-04-23 DIAGNOSIS — F17210 Nicotine dependence, cigarettes, uncomplicated: Secondary | ICD-10-CM

## 2015-04-23 DIAGNOSIS — Z23 Encounter for immunization: Secondary | ICD-10-CM

## 2015-04-23 DIAGNOSIS — Z86718 Personal history of other venous thrombosis and embolism: Secondary | ICD-10-CM

## 2015-04-23 NOTE — Patient Instructions (Signed)
Mr. Boeckman it was nice seeing you today.  -Please use over the counter Bacitracin ointment on the sores and keep the area clean.   -Please call us if you develop a fever or the area becomes infected.   -Continue taking Gabapentin.  -Continue using compression stockings.  -Have a wonderful day!

## 2015-04-24 NOTE — Assessment & Plan Note (Signed)
Patient's peripheral neuropathic pain is likely not from diabetes as his hemoglobin A1c 5.7 on 03/10/2015 is in the prediabetic range. B12 level normal on 03/10/2015. During his previous visit, the dose of gabapentin was increased to 300 mg 3 times a day. Patient is not complaining of any pain, burning, or tingling sensation at present. States the new dose of the medication has helped him a lot. -Continue gabapentin 300 mg 3 times a day

## 2015-04-24 NOTE — Assessment & Plan Note (Addendum)
Patient states he just started using nicotine patches on 04/23/2015. He received nicotine patches from the quit line and is motivated to stop smoking.  -Reassess his progress at next visit.

## 2015-04-24 NOTE — Assessment & Plan Note (Addendum)
Patient complaining of a two-month history of 5-6 pustules superimposed on the chronic venous stasis skin changes of his right lower extremity. States he went to wound care 2 months ago but cannot go back because they only see patients with open sores or wounds. Denies any pain on palpation of the right lower extremity. No increased erythema or warmth of the skin. Homans sign negative. 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 The area does not look infected and the patient is afebrile. The pustules are likely from decreased air circulation and lack of hygiene as the patient wears compression stockings most of the time. -He has been advised to keep the area clean and dry.  -Advised to call the clinic if he develops a fever or the pustules erupt/ become infected.

## 2015-04-24 NOTE — Progress Notes (Addendum)
Patient ID: [name], male   DOB: Jan 10, 1966, 49 y.o.   MRN: [id number]   Subjective:   Patient ID: ALDRICK DERRIG male   DOB: [birth year] 49 y.o.   MRN: [id number]  HPI: Mr.Hardeep A Tsuda is a 49 y.o. M with a PMHx of conditions listed below presenting to the clinic for a follow up of his chronic venous stasis dermatitis. Please see the assessment and plan section for the status of the patient's chronic medical conditions.    Past Medical History  Diagnosis Date  . DVT (deep venous thrombosis)     on Coumadin Rx  . Substance abuse     crack, cocaine, last use 2007  . Tobacco abuse   . DVT (deep venous thrombosis)   . Bronchitis     last time 2 yrs ago  . Heart murmur   . Shortness of breath   . Tuberculosis     ' I test Positive "  . Asthma     uses Albuterol daily as needed  . Cough     smokers  . Arthritis     knees  . Joint pain   . GERD (gastroesophageal reflux disease)     only takes something about 2 times a yr  . Cellulitis   . Bipolar 1 disorder   . Alcohol abuse   . Pulmonary embolism 2014  . Chronic dermatitis     due to Xarelto Rx  . Depression   . GERD (gastroesophageal reflux disease)    Current Outpatient Prescriptions  Medication Sig Dispense Refill  . albuterol (PROVENTIL HFA;VENTOLIN HFA) 108 (90 BASE) MCG/ACT inhaler Inhale 2 puffs into the lungs every 4 (four) hours as needed for wheezing or shortness of breath. 1 Inhaler 3  . DULoxetine (CYMBALTA) 60 MG capsule Take 1 capsule (60 mg total) by mouth daily. 90 capsule 3  . Fluticasone-Salmeterol (ADVAIR DISKUS) 500-50 MCG/DOSE AEPB Inhale 1 puff into the lungs 2 (two) times daily. 1 each 6  . furosemide (LASIX) 40 MG tablet Take 1 tablet (40 mg total) by mouth daily. 30 tablet 0  . gabapentin (NEURONTIN) 300 MG capsule Take 1 capsule (300 mg total) by mouth 3 (three) times daily. 90 capsule 3  . hydrocerin (EUCERIN) CREA Apply 1 application topically daily. 454 g 0  . hydrOXYzine (ATARAX/VISTARIL)  25 MG tablet Take 1 tablet (25 mg total) by mouth 3 (three) times daily as needed for itching. 30 tablet 6  . lidocaine (XYLOCAINE) 5 % ointment Apply 1 application topically daily. 35.44 g 2  . mineral oil-hydrophilic petrolatum (AQUAPHOR) ointment Apply 1 application topically 3 (three) times daily as needed for dry skin or irritation.    . mupirocin nasal ointment (BACTROBAN) 2 % Use one-half of tube in each nostril twice daily for five (5) days. After application, press sides of nose together and gently massage. (Patient not taking: Reported on 02/10/2015) 10 g 0  . nicotine (NICODERM CQ - DOSED IN MG/24 HOURS) 14 mg/24hr patch Place 1 patch (14 mg total) onto the skin daily. 42 patch 0  . traZODone (DESYREL) 100 MG tablet Take 1 tablet (100 mg total) by mouth at bedtime as needed for sleep. 14 tablet 5  . triamcinolone cream (KENALOG) 0.5 % Apply 1 application topically daily. Apply to skin on legs once daily and wash hands after use. 30 g 1  . warfarin (COUMADIN) 5 MG tablet UPDATED DOSE: Take 2 tablets (10 mg) by mouth daily, except 2.5 tablets (12.5  mg) on Thursdays and Saturdays 63 tablet 2   No current facility-administered medications for this visit.   Family History  Problem Relation Age of Onset  . Asthma Mother   . Throat cancer Father    Social History   Social History  . Marital Status: Single    Spouse Name: N/A  . Number of Children: N/A  . Years of Education: N/A   Occupational History  . Details Cars    Social History Main Topics  . Smoking status: Current Every Day Smoker -- 0.25 packs/day for 25 years    Types: Cigarettes  . Smokeless tobacco: Former Systems developer     Comment: "stopped chewing in 1990s" 2 cigs per day. Quitting  . Alcohol Use: No     Comment: last alcohol intake 2 months ago per pt  . Drug Use: No     Comment: hx of-last time was 2 months ago per pt  . Sexual Activity: Not on file   Other Topics Concern  . Not on file   Social History Narrative    Working at [employer] now unemployed.  States he has insurance through them now.    Lives with mom and step dad             Review of Systems: Review of Systems  Constitutional: Negative for fever and chills.  HENT: Negative for ear pain.   Eyes: Negative for blurred vision and pain.  Respiratory: Negative for cough, shortness of breath and wheezing.   Cardiovascular: Negative for chest pain.  Gastrointestinal: Negative for nausea, vomiting, abdominal pain, diarrhea and constipation.  Genitourinary: Negative for dysuria, urgency and frequency.  Musculoskeletal: Negative for myalgias and joint pain.  Skin: Negative for itching.       Pustules on the right lower extremity   Neurological: Negative for dizziness, sensory change, focal weakness and headaches.   Objective:  Physical Exam: Filed Vitals:   04/23/15 0934  BP: 127/85  Pulse: 80  Temp: 98.7 F (37.1 C)  TempSrc: Oral  Height: 6\' 4"  (1.93 m)  Weight: 251 lb (113.853 kg)  SpO2: 97%   Physical Exam  Constitutional: He is oriented to person, place, and time. He appears well-developed and well-nourished. No distress.  HENT:  Head: Normocephalic and atraumatic.  Eyes: EOM are normal. Pupils are equal, round, and reactive to light.  Neck: Neck supple. No tracheal deviation present.  Cardiovascular: Normal rate, regular rhythm and intact distal pulses.   Pulmonary/Chest: Effort normal. No respiratory distress. He has no wheezes. He has no rales.  Abdominal: Soft. Bowel sounds are normal. He exhibits no distension. There is no tenderness.  Musculoskeletal: He exhibits no tenderness.  RLE: larger in size compared to the left. No increased erythema or warmth. A few large pustules noted. No open wounds. No pain on palpation. Homan's sign negative.   Neurological: He is alert and oriented to person, place, and time.  Skin: Skin is warm and dry. No erythema.   Addendum: Chronic venous stasis changes noticed on b/l  lower extremities.   Assessment & Plan:

## 2015-04-26 NOTE — Progress Notes (Signed)
Internal Medicine Clinic Attending  I saw and evaluated the patient.  I personally confirmed the key portions of the history and exam documented by Dr. Rathore and I reviewed pertinent patient test results.  The assessment, diagnosis, and plan were formulated together and I agree with the documentation in the resident's note.  

## 2015-05-11 ENCOUNTER — Ambulatory Visit (INDEPENDENT_AMBULATORY_CARE_PROVIDER_SITE_OTHER): Payer: Self-pay | Admitting: Pharmacist

## 2015-05-11 DIAGNOSIS — I87091 Postthrombotic syndrome with other complications of right lower extremity: Secondary | ICD-10-CM

## 2015-05-11 DIAGNOSIS — I82401 Acute embolism and thrombosis of unspecified deep veins of right lower extremity: Secondary | ICD-10-CM

## 2015-05-11 DIAGNOSIS — Z7901 Long term (current) use of anticoagulants: Secondary | ICD-10-CM

## 2015-05-11 DIAGNOSIS — M792 Neuralgia and neuritis, unspecified: Secondary | ICD-10-CM

## 2015-05-11 DIAGNOSIS — I2699 Other pulmonary embolism without acute cor pulmonale: Secondary | ICD-10-CM

## 2015-05-11 MED ORDER — WARFARIN SODIUM 5 MG PO TABS: ORAL_TABLET | ORAL | Status: DC

## 2015-05-12 ENCOUNTER — Other Ambulatory Visit: Payer: Self-pay | Admitting: *Deleted

## 2015-05-12 DIAGNOSIS — Z7901 Long term (current) use of anticoagulants: Secondary | ICD-10-CM

## 2015-05-12 DIAGNOSIS — M792 Neuralgia and neuritis, unspecified: Secondary | ICD-10-CM

## 2015-05-12 DIAGNOSIS — I2699 Other pulmonary embolism without acute cor pulmonale: Secondary | ICD-10-CM

## 2015-05-12 DIAGNOSIS — I87091 Postthrombotic syndrome with other complications of right lower extremity: Secondary | ICD-10-CM

## 2015-05-12 DIAGNOSIS — I82401 Acute embolism and thrombosis of unspecified deep veins of right lower extremity: Secondary | ICD-10-CM

## 2015-05-12 LAB — POCT INR: INR: 1.2

## 2015-05-13 MED ORDER — GABAPENTIN 300 MG PO CAPS: 300.0000 mg | ORAL_CAPSULE | Freq: Three times a day (TID) | ORAL | Status: DC

## 2015-05-13 MED ORDER — WARFARIN SODIUM 5 MG PO TABS: ORAL_TABLET | ORAL | Status: DC

## 2015-05-13 NOTE — Progress Notes (Signed)
Anticoagulation Management [name] [name] is a 49 y.o. male who reports to the clinic for monitoring of warfarin treatment.    Indication: DVT Duration: indefinite  Anticoagulation Clinic Visit History: Anticoagulation Episode Summary    Current INR goal 2.0-3.0  Next INR check 05/25/2015  INR from last check 2.8 (04/13/2015)  Most recent INR 1.2! (05/12/2015)  Weekly max dose   Target end date   INR check location   Preferred lab   Send INR reminders to [facility]   Indications  Long-term (current) use of anticoagulants [Z79.01] DVT (Resolved) [I82.409] Right leg DVT ([place]) [I82.401]        Comments       Anticoagulation Care Providers    Provider Role Specialty Phone number   Bartholomew Crews, MD  Internal Medicine [phone]     ASSESSMENT Recent Results: Recent results are below, the most recent result is correlated with a dose of 75 mg per week: Lab Results  Component Value Date   INR 1.2 05/12/2015   INR 2.8 04/13/2015   INR 2.5 03/30/2015   INR today: Subtherapeutic  Due to non-adherence, patient reports missing 2 doses within past few days.  Anticoagulation Dosing: INR as of 05/11/2015 and Previous Dosing Information    INR Dt INR Goal Madilyn Fireman Sun Mon Tue Wed Thu Fri Sat   04/13/2015 2.8 2.0-3.0 75 mg 10 mg 10 mg 10 mg 10 mg 12.5 mg 10 mg 12.5 mg    Previous description        Continue Warfarin 12.5 mg on Thurs/Sat and 10 mg all other days.     Anticoagulation Dose Instructions as of 05/11/2015      Total Sun Mon Tue Wed Thu Fri Sat   New Dose 75 mg 10 mg 10 mg 10 mg 10 mg 12.5 mg 10 mg 12.5 mg     (5 mg x 2)  (5 mg x 2)  (5 mg x 2)  (5 mg x 2)  (5 mg x 2.5)  (5 mg x 2)  (5 mg x 2.5)                           PLAN Weekly dose was unchanged   Patient Instructions  Patient educated about medication as defined in this encounter and verbalized understanding by repeating back instructions provided.     Follow-up Return in about 2 weeks  (around 05/25/2015) for Follow up INR 05/25/15 at 10am.  [name],[name]  15 minutes spent face-to-face with the patient during the encounter. 50% of time spent on education. 50% of time was spent on assessment and plan.

## 2015-05-13 NOTE — Patient Instructions (Signed)
Patient educated about medication as defined in this encounter and verbalized understanding by repeating back instructions provided.   

## 2015-05-13 NOTE — Progress Notes (Signed)
INTERNAL MEDICINE TEACHING ATTENDING ADDENDUM - Lalla Brothers M.D  Duration- indefinite, Indication- recurrent unprovoked DVT, INR- 2.8. Agree with pharmacy recommendations as outlined in their note.

## 2015-05-13 NOTE — Addendum Note (Signed)
Addended by: Dellia Nims on: 05/13/2015 08:52 AM   Modules accepted: Orders

## 2015-05-14 ENCOUNTER — Emergency Department (HOSPITAL_COMMUNITY)
Admission: EM | Admit: 2015-05-14 | Discharge: 2015-05-14 | Disposition: A | Discharge status: Home / Self Care | Payer: Self-pay | Attending: Emergency Medicine | Admitting: Emergency Medicine

## 2015-05-14 ENCOUNTER — Encounter (HOSPITAL_COMMUNITY): Payer: Self-pay | Admitting: Emergency Medicine

## 2015-05-14 DIAGNOSIS — Z720 Tobacco use: Secondary | ICD-10-CM | POA: Insufficient documentation

## 2015-05-14 DIAGNOSIS — M1990 Unspecified osteoarthritis, unspecified site: Secondary | ICD-10-CM | POA: Insufficient documentation

## 2015-05-14 DIAGNOSIS — R300 Dysuria: Secondary | ICD-10-CM | POA: Insufficient documentation

## 2015-05-14 DIAGNOSIS — J45909 Unspecified asthma, uncomplicated: Secondary | ICD-10-CM | POA: Insufficient documentation

## 2015-05-14 DIAGNOSIS — Z8611 Personal history of tuberculosis: Secondary | ICD-10-CM | POA: Insufficient documentation

## 2015-05-14 DIAGNOSIS — Z86718 Personal history of other venous thrombosis and embolism: Secondary | ICD-10-CM | POA: Insufficient documentation

## 2015-05-14 DIAGNOSIS — R011 Cardiac murmur, unspecified: Secondary | ICD-10-CM | POA: Insufficient documentation

## 2015-05-14 DIAGNOSIS — F319 Bipolar disorder, unspecified: Secondary | ICD-10-CM | POA: Insufficient documentation

## 2015-05-14 DIAGNOSIS — Z86711 Personal history of pulmonary embolism: Secondary | ICD-10-CM | POA: Insufficient documentation

## 2015-05-14 DIAGNOSIS — K219 Gastro-esophageal reflux disease without esophagitis: Secondary | ICD-10-CM | POA: Insufficient documentation

## 2015-05-14 DIAGNOSIS — Z79899 Other long term (current) drug therapy: Secondary | ICD-10-CM | POA: Insufficient documentation

## 2015-05-14 LAB — URINALYSIS, ROUTINE W REFLEX MICROSCOPIC
BILIRUBIN URINE: NEGATIVE
Glucose, UA: NEGATIVE mg/dL
HGB URINE DIPSTICK: NEGATIVE
KETONES UR: NEGATIVE mg/dL
Leukocytes, UA: NEGATIVE
Nitrite: NEGATIVE
PH: 6 (ref 5.0–8.0)
Protein, ur: NEGATIVE mg/dL
Specific Gravity, Urine: 1.023 (ref 1.005–1.030)
Urobilinogen, UA: 1 mg/dL (ref 0.0–1.0)

## 2015-05-14 NOTE — ED Notes (Signed)
Pt sts dysuria x 2 days; pt denies discharge
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
## 2015-05-14 NOTE — Discharge Instructions (Signed)
You have been tested for STD, if the test came back positive for any specific infection we will contact you with appropriate treatment.  No evidence of urinary tract infection today.  Dysuria Dysuria is pain or discomfort while urinating. The pain or discomfort may be felt in the tube that carries urine out of the bladder (urethra) or in the surrounding tissue of the genitals. The pain may also be felt in the groin area, lower abdomen, and lower back. You may have to urinate frequently or have the sudden feeling that you have to urinate (urgency). Dysuria can affect both men and women, but is more common in women. Dysuria can be caused by many different things, including:  Urinary tract infection in women.  Infection of the kidney or bladder.  Kidney stones or bladder stones.  Certain sexually transmitted infections (STIs), such as chlamydia.  Dehydration.  Inflammation of the vagina.  Use of certain medicines.  Use of certain soaps or scented products that cause irritation. HOME CARE INSTRUCTIONS Watch your dysuria for any changes. The following actions may help to reduce any discomfort you are feeling:  Drink enough fluid to keep your urine clear or pale yellow.  Empty your bladder often. Avoid holding urine for long periods of time.  After a bowel movement or urination, women should cleanse from front to back, using each tissue only once.  Empty your bladder after sexual intercourse.  Take medicines only as directed by your health care provider.  If you were prescribed an antibiotic medicine, finish it all even if you start to feel better.  Avoid caffeine, tea, and alcohol. They can irritate the bladder and make dysuria worse. In men, alcohol may irritate the prostate.  Keep all follow-up visits as directed by your health care provider. This is important.  If you had any tests done to find the cause of dysuria, it is your responsibility to obtain your test results. Ask the  lab or department performing the test when and how you will get your results. Talk with your health care provider if you have any questions about your results. SEEK MEDICAL CARE IF:  You develop pain in your back or sides.  You have a fever.  You have nausea or vomiting.  You have blood in your urine.  You are not urinating as often as you usually do. SEEK IMMEDIATE MEDICAL CARE IF:  You pain is severe and not relieved with medicines.  You are unable to hold down any fluids.  You or someone else notices a change in your mental function.  You have a rapid heartbeat at rest.  You have shaking or chills.  You feel extremely weak.   This information is not intended to replace advice given to you by your health care provider. Make sure you discuss any questions you have with your health care provider.   Document Released: 03/24/2004 Document Revised: 07/17/2014 Document Reviewed: 02/19/2014 Elsevier Interactive Patient Education [employer].

## 2015-05-15 LAB — HIV ANTIBODY (ROUTINE TESTING W REFLEX): HIV SCREEN 4TH GENERATION: NONREACTIVE

## 2015-05-15 LAB — RPR: RPR: NONREACTIVE

## 2015-05-17 LAB — GC/CHLAMYDIA PROBE AMP (~~LOC~~) NOT AT ARMC
CHLAMYDIA, DNA PROBE: NEGATIVE
Neisseria Gonorrhea: NEGATIVE

## 2015-05-18 ENCOUNTER — Ambulatory Visit: Payer: Self-pay

## 2015-05-25 ENCOUNTER — Ambulatory Visit (INDEPENDENT_AMBULATORY_CARE_PROVIDER_SITE_OTHER): Payer: Self-pay | Admitting: Pharmacist

## 2015-05-25 ENCOUNTER — Encounter (INDEPENDENT_AMBULATORY_CARE_PROVIDER_SITE_OTHER): Payer: Self-pay

## 2015-05-25 DIAGNOSIS — I82401 Acute embolism and thrombosis of unspecified deep veins of right lower extremity: Secondary | ICD-10-CM

## 2015-05-25 DIAGNOSIS — Z7901 Long term (current) use of anticoagulants: Secondary | ICD-10-CM

## 2015-05-25 LAB — POCT INR: INR: 1.8

## 2015-05-25 NOTE — Progress Notes (Signed)
Anticoagulation Management [name] [name] is a 49 y.o. male who reports to the clinic for monitoring of warfarin treatment.    Indication: DVTand PE history Duration: indefinite  Anticoagulation Clinic Visit History: Anticoagulation Episode Summary    Current INR goal 2.0-3.0  Next INR check 06/08/2015  INR from last check 1.8! (05/25/2015)  Weekly max dose   Target end date   INR check location   Preferred lab   Send INR reminders to [facility]   Indications  Long-term (current) use of anticoagulants [Z79.01] DVT (Resolved) [I82.409] Right leg DVT ([place]) [I82.401]        Comments       Anticoagulation Care Providers    Provider Role Specialty Phone number   Bartholomew Crews, MD  Internal Medicine [phone]     ASSESSMENT Recent Results: Recent results are below, the most recent result is correlated with a dose of 75 mg per week: Lab Results  Component Value Date   INR 1.8 05/25/2015   INR 1.2 05/12/2015   INR 2.8 04/13/2015   INR today: Subtherapeutic  Anticoagulation Dosing: patient reports 1 missed dose yesterday. INR as of 05/25/2015 and Previous Dosing Information    INR Dt INR Goal [facility] Sun Mon Tue Wed Thu Fri Sat   05/25/2015 1.8 2.0-3.0 75 mg 10 mg 10 mg 10 mg 10 mg 12.5 mg 10 mg 12.5 mg    Anticoagulation Dose Instructions as of 05/25/2015      Total Sun Mon Tue Wed Thu Fri Sat   New Dose 75 mg 10 mg 10 mg 10 mg 10 mg 12.5 mg 10 mg 12.5 mg     (5 mg x 2)  (5 mg x 2)  (5 mg x 2)  (5 mg x 2)  (5 mg x 2.5)  (5 mg x 2)  (5 mg x 2.5)                           PLAN Weekly dose was unchanged due to patient missing 1 dose yesterday. Educated patient about the importance of adherence.  Of note, patient also asked about OTC ointments for sores on RLE. Recommended patient follow up with physician (patient states he sees dermatology, or can follow up with PCP) and provided a sample OTC ointment:  Medication Samples have been provided to the  patient.  Drug: triple abx ointment Strength: 3.5mg -400U-5000U  Qty: 1 [id number] Exp.Date: 10/2017  The patient has been instructed regarding the correct time, dose, and frequency of taking this medication, including desired effects and most common side effects.   [name],[name] 1:15 PM 05/25/2015  Patient also inquired about lab results from recent tests that were done this month. Enrolled patient in MyChart so he can view these results.  Patient Instructions  Patient educated about medication as defined in this encounter and verbalized understanding by repeating back instructions provided.   Follow-up Return in about 2 weeks (around 06/08/2015) for Follow up INR 06/08/15 at 9:30am.  30 minutes spent face-to-face with the patient during the encounter. 75% of time spent on education. 25% of time was spent on assessment and plan.

## 2015-05-25 NOTE — Patient Instructions (Signed)
Patient educated about medication as defined in this encounter and verbalized understanding by repeating back instructions provided.   

## 2015-05-28 ENCOUNTER — Ambulatory Visit (INDEPENDENT_AMBULATORY_CARE_PROVIDER_SITE_OTHER): Payer: Self-pay | Admitting: Internal Medicine

## 2015-05-28 ENCOUNTER — Encounter: Payer: Self-pay | Admitting: Internal Medicine

## 2015-05-28 VITALS — BP 112/76 | HR 92 | Temp 98.0°F | Ht 73.0 in | Wt 250.1 lb

## 2015-05-28 DIAGNOSIS — M792 Neuralgia and neuritis, unspecified: Secondary | ICD-10-CM

## 2015-05-28 DIAGNOSIS — G629 Polyneuropathy, unspecified: Secondary | ICD-10-CM

## 2015-05-28 DIAGNOSIS — R21 Rash and other nonspecific skin eruption: Secondary | ICD-10-CM

## 2015-05-28 MED ORDER — GABAPENTIN 300 MG PO CAPS: 600.0000 mg | ORAL_CAPSULE | Freq: Three times a day (TID) | ORAL | Status: DC

## 2015-05-28 NOTE — Patient Instructions (Signed)
Take gabapentin 600 mg three times a day for your pain.  Follow up with [facility]. We will help you set up this appointment.

## 2015-05-28 NOTE — Progress Notes (Signed)
Rx called in to [facility]. Hilda Blades Jone Panebianco RN 05/28/15 2:40PM

## 2015-05-28 NOTE — Assessment & Plan Note (Addendum)
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 For these conditions, patient follows with Dermatology and uses the following regimen: Support stockings, Kenalog, cetaphil. Patient is also on warfarin (last INR 1.8) for history of recurrent DVT. He uses chlorhexidine as body wash for folliculitis on his chest. He presents today with worsening rash which he describes as bumps, sores, itching, and weaping extending up his right lower extremity to his buttocks. He has experienced this rash before-last time in June which cleared up and now has reoccurred 2 weeks ago. Because of this rash, he feels that his pain has worsened. He describes the pain as sharp, burning, pins and needles. He currently takes gabapentin 300 mg TID with some relief. Physical exam is consistent with flare/worsening of his venous stasis dermatitis and eczematous dermatitis. I question if he has overlying folliculitis.  Plan: -Increase gabapentin to 600 mg TID -Keep extremity clean, wear compression stockings -Continue Kenalog cream and cetaphil lotion -Referral back to Dermatology

## 2015-05-28 NOTE — Assessment & Plan Note (Signed)
Patient continues to have sharp, shooting, burning pain in his lower extremities bilaterally, but worst in his RLE. Gabapentin 300 mg TID has been helping, but as pain worsens it is not longer controlled.   Plan: -Increase Gabapentin to 600 mg TID

## 2015-05-28 NOTE — Progress Notes (Signed)
Subjective:    Patient ID: [name] [name], male    DOB: 03/19/1966, 49 y.o.   MRN: [id number]  HPI [name] is a 49 y.o. male with PMHx of RLE DVT, GERD, neuropathy, and venous stasis who presents to the clinic for bilateral lower extremity pain. Please see A&P for the status of the patient's chronic medical problems.   Past Medical History  Diagnosis Date  . DVT (deep venous thrombosis) (HCC)     on Coumadin Rx  . Substance abuse     crack, cocaine, last use 2007  . Tobacco abuse   . DVT (deep venous thrombosis) ([place])   . Bronchitis     last time 2 yrs ago  . Heart murmur   . Shortness of breath   . Tuberculosis     ' I test Positive "  . Asthma     uses Albuterol daily as needed  . Cough     smokers  . Arthritis     knees  . Joint pain   . GERD (gastroesophageal reflux disease)     only takes something about 2 times a yr  . Cellulitis   . Bipolar 1 disorder ([place])   . Alcohol abuse   . Pulmonary embolism ([place]) 2014  . Chronic dermatitis     due to Xarelto Rx  . Depression   . GERD (gastroesophageal reflux disease)     Outpatient Encounter Prescriptions as of 05/28/2015  Medication Sig  . albuterol (PROVENTIL HFA;VENTOLIN HFA) 108 (90 BASE) MCG/ACT inhaler Inhale 2 puffs into the lungs every 4 (four) hours as needed for wheezing or shortness of breath.  . DULoxetine (CYMBALTA) 60 MG capsule Take 1 capsule (60 mg total) by mouth daily.  . Fluticasone-Salmeterol (ADVAIR DISKUS) 500-50 MCG/DOSE AEPB Inhale 1 puff into the lungs 2 (two) times daily.  . furosemide (LASIX) 40 MG tablet Take 1 tablet (40 mg total) by mouth daily.  Marland Kitchen gabapentin (NEURONTIN) 300 MG capsule Take 1 capsule (300 mg total) by mouth 3 (three) times daily.  . hydrocerin (EUCERIN) CREA Apply 1 application topically daily.  . hydrOXYzine (ATARAX/VISTARIL) 25 MG tablet Take 1 tablet (25 mg total) by mouth 3 (three) times daily as needed for itching.  . lidocaine (XYLOCAINE) 5 % ointment Apply 1  application topically daily.  . mineral oil-hydrophilic petrolatum (AQUAPHOR) ointment Apply 1 application topically 3 (three) times daily as needed for dry skin or irritation.  . mupirocin nasal ointment (BACTROBAN) 2 % Use one-half of tube in each nostril twice daily for five (5) days. After application, press sides of nose together and gently massage. (Patient not taking: Reported on 02/10/2015)  . nicotine (NICODERM CQ - DOSED IN MG/24 HOURS) 14 mg/24hr patch Place 1 patch (14 mg total) onto the skin daily.  . traZODone (DESYREL) 100 MG tablet Take 1 tablet (100 mg total) by mouth at bedtime as needed for sleep.  Marland Kitchen triamcinolone cream (KENALOG) 0.5 % Apply 1 application topically daily. Apply to skin on legs once daily and wash hands after use.  . warfarin (COUMADIN) 5 MG tablet UPDATED DOSE: Take 2 tablets (10 mg) by mouth daily, except 2.5 tablets (12.5 mg) on Thursdays and Saturdays   No facility-administered encounter medications on file as of 05/28/2015.    Family History  Problem Relation Age of Onset  . Asthma Mother   . Throat cancer Father     Social History   Social History  . Marital Status: Single  Spouse Name: N/A  . Number of Children: N/A  . Years of Education: N/A   Occupational History  . Details Cars    Social History Main Topics  . Smoking status: Current Every Day Smoker -- 0.25 packs/day for 25 years    Types: Cigarettes  . Smokeless tobacco: Former Systems developer     Comment: "stopped chewing in 1990s" 2 cigs per day. Quitting  . Alcohol Use: No     Comment: last alcohol intake 2 months ago per pt  . Drug Use: No     Comment: hx of-last time was 2 months ago per pt  . Sexual Activity: Not on file   Other Topics Concern  . Not on file   Social History Narrative   Working at [employer] now unemployed.  States he has insurance through them now.    Lives with mom and step dad              Review of Systems General: Denies fever, chills,  fatigue.  Respiratory: Denies SOB, cough.   Cardiovascular: Denies chest pain and palpitations.  Gastrointestinal: Denies nausea, vomiting, abdominal pain, diarrhea.  Musculoskeletal: Admits to lower extremity pain.  Skin: Admits to lower extremity sores, rash, scaling.  Neurological: Denies dizziness, headaches, weakness, lightheadedness, numbness.     Objective:   Physical Exam Filed Vitals:   05/28/15 1354  BP: 112/76  Pulse: 92  Temp: 98 F (36.7 C)  TempSrc: Oral  Height: 6\' 1"  (1.854 m)  Weight: 250 lb 1.6 oz (113.445 kg)  SpO2: 98%   General: Vital signs reviewed.  Patient is well-developed and well-nourished, in no acute distress and cooperative with exam.   Cardiovascular: RRR, S1 normal, S2 normal, no murmurs, gallops, or rubs. Pulmonary/Chest: Clear to auscultation bilaterally, no wheezes, rales, or rhonchi. Extremities: 1+ pitting edema in right lower extremity, no pitting edema in left lower extremity. Evidence of hyperpigmentation in LE R >L. RLE is tender to palpation diffusely. No evidence of erythema. Skin appears dry. 2+ pedal pulses bilaterally. 3 2 cm x 2 cm raised lesions that are dry, cracked, scaling without fluctuance or drainage. Numerous small closed and open lesions consistent with dermatitis versus folliculitis on the right lower extremity extending to buttocks.  Psychiatric: Normal mood and affect. speech and behavior is normal. Cognition and memory are normal.      Assessment & Plan:   Please see problem based assessment and plan.

## 2015-06-01 NOTE — Progress Notes (Signed)
Internal Medicine Clinic Attending  I saw and evaluated the patient.  I personally confirmed the key portions of the history and exam documented by Dr. Richardson and I reviewed pertinent patient test results.  The assessment, diagnosis, and plan were formulated together and I agree with the documentation in the resident's note. 

## 2015-06-02 NOTE — Progress Notes (Signed)
I have reviewed note by Dr. Maudie Mercury.

## 2015-06-08 ENCOUNTER — Ambulatory Visit: Payer: Self-pay | Admitting: Pharmacist

## 2015-06-14 ENCOUNTER — Emergency Department (HOSPITAL_COMMUNITY)
Admission: EM | Admit: 2015-06-14 | Discharge: 2015-06-14 | Disposition: A | Discharge status: Home / Self Care | Payer: Self-pay | Attending: Emergency Medicine | Admitting: Emergency Medicine

## 2015-06-14 ENCOUNTER — Encounter (HOSPITAL_COMMUNITY): Payer: Self-pay | Admitting: Family Medicine

## 2015-06-14 DIAGNOSIS — Z79899 Other long term (current) drug therapy: Secondary | ICD-10-CM | POA: Insufficient documentation

## 2015-06-14 DIAGNOSIS — J45909 Unspecified asthma, uncomplicated: Secondary | ICD-10-CM | POA: Insufficient documentation

## 2015-06-14 DIAGNOSIS — Z7951 Long term (current) use of inhaled steroids: Secondary | ICD-10-CM | POA: Insufficient documentation

## 2015-06-14 DIAGNOSIS — R011 Cardiac murmur, unspecified: Secondary | ICD-10-CM | POA: Insufficient documentation

## 2015-06-14 DIAGNOSIS — Z86711 Personal history of pulmonary embolism: Secondary | ICD-10-CM | POA: Insufficient documentation

## 2015-06-14 DIAGNOSIS — Z7901 Long term (current) use of anticoagulants: Secondary | ICD-10-CM | POA: Insufficient documentation

## 2015-06-14 DIAGNOSIS — Z8719 Personal history of other diseases of the digestive system: Secondary | ICD-10-CM | POA: Insufficient documentation

## 2015-06-14 DIAGNOSIS — I872 Venous insufficiency (chronic) (peripheral): Secondary | ICD-10-CM | POA: Insufficient documentation

## 2015-06-14 DIAGNOSIS — I878 Other specified disorders of veins: Secondary | ICD-10-CM

## 2015-06-14 DIAGNOSIS — F17210 Nicotine dependence, cigarettes, uncomplicated: Secondary | ICD-10-CM | POA: Insufficient documentation

## 2015-06-14 DIAGNOSIS — Z86718 Personal history of other venous thrombosis and embolism: Secondary | ICD-10-CM | POA: Insufficient documentation

## 2015-06-14 DIAGNOSIS — M79604 Pain in right leg: Secondary | ICD-10-CM

## 2015-06-14 DIAGNOSIS — F319 Bipolar disorder, unspecified: Secondary | ICD-10-CM | POA: Insufficient documentation

## 2015-06-14 DIAGNOSIS — Z8739 Personal history of other diseases of the musculoskeletal system and connective tissue: Secondary | ICD-10-CM | POA: Insufficient documentation

## 2015-06-14 DIAGNOSIS — Z872 Personal history of diseases of the skin and subcutaneous tissue: Secondary | ICD-10-CM | POA: Insufficient documentation

## 2015-06-14 DIAGNOSIS — Z8611 Personal history of tuberculosis: Secondary | ICD-10-CM | POA: Insufficient documentation

## 2015-06-14 LAB — CBC WITH DIFFERENTIAL/PLATELET
BASOS PCT: 1 %
Basophils Absolute: 0.1 10*3/uL (ref 0.0–0.1)
Eosinophils Absolute: 0.2 10*3/uL (ref 0.0–0.7)
Eosinophils Relative: 4 %
HEMATOCRIT: 44.9 % (ref 39.0–52.0)
HEMOGLOBIN: 15.1 g/dL (ref 13.0–17.0)
LYMPHS ABS: 1.6 10*3/uL (ref 0.7–4.0)
Lymphocytes Relative: 29 %
MCH: 31.8 pg (ref 26.0–34.0)
MCHC: 33.6 g/dL (ref 30.0–36.0)
MCV: 94.5 fL (ref 78.0–100.0)
MONOS PCT: 10 %
Monocytes Absolute: 0.5 10*3/uL (ref 0.1–1.0)
NEUTROS ABS: 3.1 10*3/uL (ref 1.7–7.7)
NEUTROS PCT: 56 %
Platelets: 206 10*3/uL (ref 150–400)
RBC: 4.75 MIL/uL (ref 4.22–5.81)
RDW: 14 % (ref 11.5–15.5)
WBC: 5.5 10*3/uL (ref 4.0–10.5)

## 2015-06-14 MED ORDER — OXYCODONE-ACETAMINOPHEN 5-325 MG PO TABS
1.0000 | ORAL_TABLET | Freq: Once | ORAL | Status: AC
  Administered 2015-06-14: 1 via ORAL

## 2015-06-14 MED ORDER — OXYCODONE-ACETAMINOPHEN 5-325 MG PO TABS
ORAL_TABLET | ORAL | Status: AC
  Filled 2015-06-14: qty 1

## 2015-06-14 MED ORDER — OXYCODONE-ACETAMINOPHEN 5-325 MG PO TABS
1.0000 | ORAL_TABLET | Freq: Once | ORAL | Status: AC
  Administered 2015-06-14: 1 via ORAL
  Filled 2015-06-14: qty 1

## 2015-06-14 MED ORDER — CEPHALEXIN 500 MG PO CAPS: 500.0000 mg | ORAL_CAPSULE | Freq: Two times a day (BID) | ORAL | Status: DC
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
## 2015-06-14 NOTE — ED Notes (Signed)
Pt here for sores in legs that are not healing and weeping. sts a lot of irritation and itching and pain. sts recently dx with dvt and started on blood thinners.

## 2015-06-14 NOTE — Discharge Instructions (Signed)
Schedule a follow up appointment with your PCP in the next few days for a wound check. Schedule a follow up appointment with dermatology. Return to ED with signs of infection   Musculoskeletal Pain Musculoskeletal pain is muscle and boney aches and pains. These pains can occur in any part of the body. Your caregiver may treat you without knowing the cause of the pain. They may treat you if blood or urine tests, X-rays, and other tests were normal.  CAUSES There is often not a definite cause or reason for these pains. These pains may be caused by a type of germ (virus). The discomfort may also come from overuse. Overuse includes working out too hard when your body is not fit. Boney aches also come from weather changes. Bone is sensitive to atmospheric pressure changes. HOME CARE INSTRUCTIONS   Ask when your test results will be ready. Make sure you get your test results.  Only take over-the-counter or prescription medicines for pain, discomfort, or fever as directed by your caregiver. If you were given medications for your condition, do not drive, operate machinery or power tools, or sign legal documents for 24 hours. Do not drink alcohol. Do not take sleeping pills or other medications that may interfere with treatment.  Continue all activities unless the activities cause more pain. When the pain lessens, slowly resume normal activities. Gradually increase the intensity and duration of the activities or exercise.  During periods of severe pain, bed rest may be helpful. Lay or sit in any position that is comfortable.  Putting ice on the injured area.  Put ice in a bag.  Place a towel between your skin and the bag.  Leave the ice on for 15 to 20 minutes, 3 to 4 times a day.  Follow up with your caregiver for continued problems and no reason can be found for the pain. If the pain becomes worse or does not go away, it may be necessary to repeat tests or do additional testing. Your caregiver may  need to look further for a possible cause. SEEK IMMEDIATE MEDICAL CARE IF:  You have pain that is getting worse and is not relieved by medications.  You develop chest pain that is associated with shortness or breath, sweating, feeling sick to your stomach (nauseous), or throw up (vomit).  Your pain becomes localized to the abdomen.  You develop any new symptoms that seem different or that concern you. MAKE SURE YOU:   Understand these instructions.  Will watch your condition.  Will get help right away if you are not doing well or get worse.   This information is not intended to replace advice given to you by your health care provider. Make sure you discuss any questions you have with your health care provider.   Document Released: 06/26/2005 Document Revised: 09/18/2011 Document Reviewed: 02/28/2013 Elsevier Interactive Patient Education [employer].

## 2015-06-14 NOTE — ED Notes (Signed)
Pt requesting additional pain medication. PA made aware.  

## 2015-06-18 ENCOUNTER — Ambulatory Visit (INDEPENDENT_AMBULATORY_CARE_PROVIDER_SITE_OTHER): Payer: Self-pay | Admitting: Internal Medicine

## 2015-06-18 ENCOUNTER — Encounter: Payer: Self-pay | Admitting: Internal Medicine

## 2015-06-18 VITALS — BP 124/88 | HR 72 | Temp 97.9°F | Ht 76.0 in | Wt 252.1 lb

## 2015-06-18 DIAGNOSIS — I87009 Postthrombotic syndrome without complications of unspecified extremity: Secondary | ICD-10-CM

## 2015-06-18 DIAGNOSIS — L309 Dermatitis, unspecified: Secondary | ICD-10-CM

## 2015-06-18 DIAGNOSIS — I825Z1 Chronic embolism and thrombosis of unspecified deep veins of right distal lower extremity: Secondary | ICD-10-CM

## 2015-06-18 MED ORDER — TRIAMCINOLONE ACETONIDE 0.1 % EX CREA: 1.0000 "application " | TOPICAL_CREAM | Freq: Two times a day (BID) | CUTANEOUS | Status: DC

## 2015-06-18 NOTE — Progress Notes (Signed)
INR 2.1 today. Patient reports no signs or symptoms of bleeding or thromboembolism. Patient advised to continue current dose of 75 mg weekly (2 tablets daily, except 2.5 mg Thursdays and Saturdays). Return to clinic in 4 weeks for INR monitoring. Patient verbalized understanding.

## 2015-06-18 NOTE — Patient Instructions (Addendum)
Today we referred you for your leg ulcer to be seen by the wound care center for treatment and unna boot fitting when appropriate.  We also referred you to have an appointment scheduled with the vascular surgery clinic to be assessed for your chronic venous stasis.  The health department now has a prescription for triamcinolone 0.1% cream to be applied to affected areas of your skin 1-2 times daily to help relive the itching and blistering from your eczema.  If your symptoms continue to worsen or you have trouble following up with these plans please call us back at [phone].

## 2015-06-19 NOTE — Assessment & Plan Note (Signed)
INR checked today was 2.1, and patient's anticoagulation was reviewed with Dr. Maudie Mercury today. HE will continue current dose of coumadin and follow up for recheck at 4 weeks.

## 2015-06-19 NOTE — Assessment & Plan Note (Signed)
Not able to obtain clobetasol through health department as they are not carrying this currently. We will refill triamcinolone 0.1% topical cream as this is the highest potency topical steroid available with them at this time. If he has no improvement on this we can prescribe him a higher potency treatment through alternative pharmacy but he reports significant financial limitations for spending on medications.

## 2015-06-19 NOTE — Progress Notes (Signed)
Subjective:   Patient ID: [name] [name] male   DOB: 30-Nov-1965 49 y.o.   MRN: [id number]  HPI: [name] is a 49 y.o. man with past medical history as described below who is seen for follow up from the ED 4 days ago for his right lower leg pain. 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 He was prescribed Keflex for presumed cellulitis of the leg but thinks there is no improvement in his symptoms. He feels the ulcer on his leg has increased in size during this week and has an increased amount of seeping serous fluid.  See problem based assessment and plan below for additional details.  Past Medical History  Diagnosis Date  . DVT (deep venous thrombosis) (HCC)     on Coumadin Rx  . Substance abuse     crack, cocaine, last use 2007  . Tobacco abuse   . DVT (deep venous thrombosis) ([place])   . Bronchitis     last time 2 yrs ago  . Heart murmur   . Shortness of breath   . Tuberculosis     ' I test Positive "  . Asthma     uses Albuterol daily as needed  . Cough     smokers  . Arthritis     knees  . Joint pain   . GERD (gastroesophageal reflux disease)     only takes something about 2 times a yr  . Cellulitis   . Bipolar 1 disorder ([place])   . Alcohol abuse   . Pulmonary embolism ([place]) 2014  . Chronic dermatitis     due to Xarelto Rx  . Depression   . GERD (gastroesophageal reflux disease)    Current Outpatient Prescriptions  Medication Sig Dispense Refill  . albuterol (PROVENTIL HFA;VENTOLIN HFA) 108 (90 BASE) MCG/ACT inhaler Inhale 2 puffs into the lungs every 4 (four) hours as needed for wheezing or shortness of breath. 1 Inhaler 3  . cephALEXin (KEFLEX) 500 MG capsule Take 1 capsule (500 mg total) by mouth 2 (two) times daily. 14 capsule 0  . DULoxetine (CYMBALTA) 60 MG capsule Take 1 capsule (60 mg total) by mouth daily. 90 capsule 3  . Fluticasone-Salmeterol (ADVAIR DISKUS) 500-50  MCG/DOSE AEPB Inhale 1 puff into the lungs 2 (two) times daily. 1 each 6  . furosemide (LASIX) 40 MG tablet Take 1 tablet (40 mg total) by mouth daily. 30 tablet 0  . gabapentin (NEURONTIN) 300 MG capsule Take 2 capsules (600 mg total) by mouth 3 (three) times daily. 180 capsule 3  . hydrocerin (EUCERIN) CREA Apply 1 application topically daily. 454 g 0  . hydrOXYzine (ATARAX/VISTARIL) 25 MG tablet Take 1 tablet (25 mg total) by mouth 3 (three) times daily as needed for itching. 30 tablet 6  . lidocaine (XYLOCAINE) 5 % ointment Apply 1 application topically daily. 35.44 g 2  . mupirocin nasal ointment (BACTROBAN) 2 % Use one-half of tube in each nostril twice daily for five (5) days. After application, press sides of nose together and gently massage. (Patient not taking: Reported on 02/10/2015) 10 g 0  . nicotine (NICODERM CQ - DOSED IN MG/24 HOURS) 14 mg/24hr patch Place 1 patch (14 mg total) onto the skin daily. 42 patch 0  . traZODone (DESYREL) 100 MG tablet Take 1 tablet (100 mg total) by mouth at bedtime as needed for sleep. 14 tablet 5  . triamcinolone  cream (KENALOG) 0.1 % Apply 1 application topically 2 (two) times daily. 80 g 1  . warfarin (COUMADIN) 5 MG tablet UPDATED DOSE: Take 2 tablets (10 mg) by mouth daily, except 2.5 tablets (12.5 mg) on Thursdays and Saturdays 65 tablet 5   No current facility-administered medications for this visit.   Family History  Problem Relation Age of Onset  . Asthma Mother   . Throat cancer Father    Social History   Social History  . Marital Status: Single    Spouse Name: N/A  . Number of Children: N/A  . Years of Education: N/A   Occupational History  . Details Cars    Social History Main Topics  . Smoking status: Current Every Day Smoker -- 0.25 packs/day for 25 years    Types: Cigarettes  . Smokeless tobacco: Former Systems developer     Comment: "stopped chewing in 1990s" 2 cigs per day. Quitting  . Alcohol Use: No     Comment: last alcohol intake  2 months ago per pt  . Drug Use: No     Comment: hx of-last time was 2 months ago per pt  . Sexual Activity: Not Asked   Other Topics Concern  . None   Social History Narrative   Working at [employer] now unemployed.  States he has insurance through them now.    Lives with mom and step dad             Review of Systems: Review of Systems  Constitutional: Negative for fever and chills.  Cardiovascular: Positive for leg swelling.  Gastrointestinal: Negative for nausea and diarrhea.  Musculoskeletal: Negative for joint pain and falls.  Skin: Positive for rash.  Neurological: Negative for focal weakness.    Objective:  Physical Exam: Filed Vitals:   06/18/15 0957  BP: 124/88  Pulse: 72  Temp: 97.9 F (36.6 C)  TempSrc: Oral  Height: 6\' 4"  (1.93 m)  Weight: 252 lb 1.6 oz (114.352 kg)  SpO2: 100%   GENERAL- alert, co-operative, NAD HEENT- Conjunctivae noninjected, oral mucosa appears moist CARDIAC- RRR, no murmurs, rubs or gallops. RESP- CTAB, no wheezes or crackles. EXTREMITIES- tense edema of the right leg to the level of the knee, with 2 lateral open ulcers present the larger at the ankle approximately 4x5cm, appear intact tissue without purulence or eschar SKIN- Pruritic rash on much of skin surface particularly extensor surfaces of arms and legs with numerous small firm papules PSYCH- Normal mood and affect, appropriate thought content and speech.  Assessment & Plan:

## 2015-06-19 NOTE — Assessment & Plan Note (Addendum)
Assessment: His symptoms seem mostly related to worsening of his chronic venous stasis in the right leg due to his history of chronic RLE DVTs. Enlarging venous stasis ulcer is too painful for him to apply compression at this time. Currently there is no evidence of infection such as erythema, systemic symptoms, or purulence rather than just serous seepage. This is concerning now as he was hospitalized for similar symptoms worsening in May of this year. Specialist input would be appropriate at this stage since there is not much medical management can offer besides keeping him on appropriate anticoagulation which he is already on.  Plan: Patient declines additional pain medication at this time Referral to wound care center for fitting of unna boot or other compressive therapy Referral to vascular surgery clinic for evaluation of his worsening chronic venous stasis.

## 2015-06-21 ENCOUNTER — Encounter: Payer: Self-pay | Admitting: Internal Medicine

## 2015-06-21 ENCOUNTER — Ambulatory Visit: Payer: Self-pay | Admitting: Internal Medicine

## 2015-06-21 ENCOUNTER — Ambulatory Visit (INDEPENDENT_AMBULATORY_CARE_PROVIDER_SITE_OTHER): Payer: Self-pay | Admitting: Internal Medicine

## 2015-06-21 VITALS — BP 122/81 | HR 100 | Temp 98.0°F | Ht 73.0 in | Wt 251.3 lb

## 2015-06-21 DIAGNOSIS — L309 Dermatitis, unspecified: Secondary | ICD-10-CM

## 2015-06-21 DIAGNOSIS — I87009 Postthrombotic syndrome without complications of unspecified extremity: Secondary | ICD-10-CM

## 2015-06-21 DIAGNOSIS — I82401 Acute embolism and thrombosis of unspecified deep veins of right lower extremity: Secondary | ICD-10-CM

## 2015-06-21 MED ORDER — PREDNISONE 50 MG PO TABS: ORAL_TABLET | ORAL | Status: DC

## 2015-06-21 MED ORDER — CLOBETASOL PROPIONATE 0.05 % EX OINT: 1.0000 "application " | TOPICAL_OINTMENT | Freq: Two times a day (BID) | CUTANEOUS | Status: DC

## 2015-06-21 NOTE — Progress Notes (Signed)
Subjective:   Patient ID: [name] [name] male   DOB: May 07, 1966 49 y.o.   MRN: [id number]  HPI: [name] [name] is a 49 y.o. male with past medical history as described below who is returning to clinic 3 days after his last visit with substantially worsening blistering rash over his arms and legs. His rash has increased in severity, degree of inflammation and seepage from blisters, and total skin surface area affected within the past 3 days. He still denies any fever or systemic symptoms. He has been applying triamcinolone 0.1% twice daily to affected areas without response. He states that his skin progressed like this a year ago and continued worsening developing staph infection from skin breakdown and he was hospitalized at that time. Skin biopsy performed ring that flare indicated spongiotic dermatitis and attributed to an eczema exacerbation.  See problem based assessment and plan below for additional details.  Past Medical History  Diagnosis Date  . DVT (deep venous thrombosis) (HCC)     on Coumadin Rx  . Substance abuse     crack, cocaine, last use 2007  . Tobacco abuse   . DVT (deep venous thrombosis) ([place])   . Bronchitis     last time 2 yrs ago  . Heart murmur   . Shortness of breath   . Tuberculosis     ' I test Positive "  . Asthma     uses Albuterol daily as needed  . Cough     smokers  . Arthritis     knees  . Joint pain   . GERD (gastroesophageal reflux disease)     only takes something about 2 times a yr  . Cellulitis   . Bipolar 1 disorder ([place])   . Alcohol abuse   . Pulmonary embolism ([place]) 2014  . Chronic dermatitis     due to Xarelto Rx  . Depression   . GERD (gastroesophageal reflux disease)    Current Outpatient Prescriptions  Medication Sig Dispense Refill  . albuterol (PROVENTIL HFA;VENTOLIN HFA) 108 (90 BASE) MCG/ACT inhaler Inhale 2 puffs into the lungs every 4 (four) hours as needed for wheezing or shortness of breath. 1 Inhaler 3  . cephALEXin  (KEFLEX) 500 MG capsule Take 1 capsule (500 mg total) by mouth 2 (two) times daily. 14 capsule 0  . DULoxetine (CYMBALTA) 60 MG capsule Take 1 capsule (60 mg total) by mouth daily. 90 capsule 3  . Fluticasone-Salmeterol (ADVAIR DISKUS) 500-50 MCG/DOSE AEPB Inhale 1 puff into the lungs 2 (two) times daily. 1 each 6  . furosemide (LASIX) 40 MG tablet Take 1 tablet (40 mg total) by mouth daily. 30 tablet 0  . gabapentin (NEURONTIN) 300 MG capsule Take 2 capsules (600 mg total) by mouth 3 (three) times daily. 180 capsule 3  . hydrocerin (EUCERIN) CREA Apply 1 application topically daily. 454 g 0  . hydrOXYzine (ATARAX/VISTARIL) 25 MG tablet Take 1 tablet (25 mg total) by mouth 3 (three) times daily as needed for itching. 30 tablet 6  . lidocaine (XYLOCAINE) 5 % ointment Apply 1 application topically daily. 35.44 g 2  . mupirocin nasal ointment (BACTROBAN) 2 % Use one-half of tube in each nostril twice daily for five (5) days. After application, press sides of nose together and gently massage. (Patient not taking: Reported on 02/10/2015) 10 g 0  . nicotine (NICODERM CQ - DOSED IN MG/24 HOURS) 14 mg/24hr patch Place 1 patch (14 mg total) onto the skin daily. 42 patch 0  .  traZODone (DESYREL) 100 MG tablet Take 1 tablet (100 mg total) by mouth at bedtime as needed for sleep. 14 tablet 5  . triamcinolone cream (KENALOG) 0.1 % Apply 1 application topically 2 (two) times daily. 80 g 1  . warfarin (COUMADIN) 5 MG tablet UPDATED DOSE: Take 2 tablets (10 mg) by mouth daily, except 2.5 tablets (12.5 mg) on Thursdays and Saturdays 65 tablet 5   No current facility-administered medications for this visit.   Family History  Problem Relation Age of Onset  . Asthma Mother   . Throat cancer Father    Social History   Social History  . Marital Status: Single    Spouse Name: N/A  . Number of Children: N/A  . Years of Education: N/A   Occupational History  . Details Cars    Social History Main Topics  .  Smoking status: Current Every Day Smoker -- 0.25 packs/day for 25 years    Types: Cigarettes  . Smokeless tobacco: Former Systems developer     Comment: "stopped chewing in 1990s" 2 cigs per day. Quitting  . Alcohol Use: No     Comment: last alcohol intake 2 months ago per pt  . Drug Use: No     Comment: hx of-last time was 2 months ago per pt  . Sexual Activity: Not Asked   Other Topics Concern  . None   Social History Narrative   Working at [employer] now unemployed.  States he has insurance through them now.    Lives with mom and step dad             Review of Systems: Review of Systems  Constitutional: Negative for fever and chills.  HENT: Negative for sore throat.   Eyes: Negative for blurred vision and double vision.  Respiratory: Negative for shortness of breath.   Cardiovascular: Positive for leg swelling. Negative for chest pain.  Gastrointestinal: Positive for diarrhea. Negative for abdominal pain.  Genitourinary: Negative for hematuria.  Musculoskeletal: Negative for falls.  Skin: Positive for itching and rash.  Neurological: Negative for focal weakness, weakness and headaches.    Objective:  Physical Exam: Filed Vitals:   06/21/15 1555  BP: 122/81  Pulse: 100  Temp: 98 F (36.7 C)  TempSrc: Oral  Height: 6\' 1"  (1.854 m)  Weight: 251 lb 4.8 oz (113.989 kg)  SpO2: 99%   GENERAL- alert, co-operative, NAD HEENT- Conjunctivae noninjected, oral mucosa appears moist CARDIAC- RRR, no murmurs, rubs or gallops. RESP- CTAB, no wheezes or crackles. EXTREMITIES- Tense edema of the right leg to the level of the knee, with 2 lateral open ulcers present the larger at the ankle approximately 4x5cm, appearing to be intact tissue without purulence or eschar SKIN- a pruritic, vesicular appearing rash is present on extensor surfaces of elbows, forearms, entirety of hand, knees shins and feet, and buttocks with some excoriations and a few ruptured blisters with clear  drainage. PSYCH- Normal mood and affect, appropriate thought content and speech.  Assessment & Plan:

## 2015-06-21 NOTE — Patient Instructions (Addendum)
Your eczema rash is significantly worse today with using just triamcinolone.  A prescription for prednisone 50mg  tablets and clobetasol ointment is sent to [facility] today. You should take prednisone 2 tabs (100mg ) daily until you return to clinic, and apply clobetasol to all affected areas twice daily.  Please return to clinic in about 3 days to see if your symptoms are responding and we will also check blood tests for some possible underlying contributing causes.  Contact us at [phone] as needed.

## 2015-06-21 NOTE — Progress Notes (Signed)
Internal Medicine Clinic Attending  I saw and evaluated the patient.  I personally confirmed the key portions of the history and exam documented by Dr. Rice and I reviewed pertinent patient test results.  The assessment, diagnosis, and plan were formulated together and I agree with the documentation in the resident's note.  

## 2015-06-22 ENCOUNTER — Ambulatory Visit: Payer: Self-pay | Admitting: Pharmacist

## 2015-06-22 ENCOUNTER — Telehealth: Payer: Self-pay | Admitting: Internal Medicine

## 2015-06-22 ENCOUNTER — Telehealth: Payer: Self-pay | Admitting: Pharmacist

## 2015-06-22 DIAGNOSIS — L309 Dermatitis, unspecified: Secondary | ICD-10-CM

## 2015-06-22 DIAGNOSIS — Z719 Counseling, unspecified: Secondary | ICD-10-CM

## 2015-06-22 DIAGNOSIS — R21 Rash and other nonspecific skin eruption: Secondary | ICD-10-CM

## 2015-06-22 MED ORDER — PREDNISONE 20 MG PO TABS: 100.0000 mg | ORAL_TABLET | Freq: Every day | ORAL | Status: DC

## 2015-06-22 MED ORDER — BETAMETHASONE DIPROPIONATE AUG 0.05 % EX OINT: TOPICAL_OINTMENT | Freq: Two times a day (BID) | CUTANEOUS | Status: DC

## 2015-06-22 NOTE — Telephone Encounter (Signed)
Resolved. We were able to work it out with [facility] outpatient pharmacy. Thank you!

## 2015-06-22 NOTE — Progress Notes (Signed)
Internal Medicine Clinic Attending  I saw and evaluated the patient.  I personally confirmed the key portions of the history and exam documented by Dr. Rice and I reviewed pertinent patient test results.  The assessment, diagnosis, and plan were formulated together and I agree with the documentation in the resident's note.  

## 2015-06-22 NOTE — Assessment & Plan Note (Signed)
Assessment: Patient's eczematous dermatitis is rapidly worsened since his evaluation 3 days ago. Is now a large area of skin involvement ruptured drainage from several of the blisters. On review of his history he was. Was evaluated for this including skin biopsy demonstrated spongiotic dermatitis pattern. He has definitely failed to respond to topical triamcinolone. He still thinks this rash is most similar to a severe drug reaction he developed was started on several times in the past. He has not had any recent medication changes associated with the development of this rash. There is sparing of the mucosal surfaces and minimal erythema associated with the rash. The trunk is also also almost entirely spared which is not very characteristic for most drug reactions. In addition to treating this as a flare of an atopic dermatitis and may be worth considering additional evaluation such as dermatitis herpetiformis. Given a large amount of skin surface area involved and significant progression of his symptoms, with known complication and hospitalization from this condition in the past we will start fairly high-dose systemic steroid treatment.  Plan: Prednisone 100 mg daily for 10 days Clobetasol 0.05% apply twice daily to affected areas Follow-up in 3 days to assess clinical progress Consider checking CBC, IgA, TTG at next visit

## 2015-06-22 NOTE — Telephone Encounter (Signed)
Assistance with medication access

## 2015-06-22 NOTE — Telephone Encounter (Signed)
Pt states he can not afford to get triamcinolone cream and prednisone. Please call pt back.

## 2015-06-22 NOTE — Telephone Encounter (Signed)
[name] [name], could you please help this pt with his meds?

## 2015-06-22 NOTE — Progress Notes (Signed)
Medication(s) were reviewed with the patient, including name, instructions, indication, goals of therapy, potential side effects, importance of adherence, and safe use.  Patient verbalized understanding by repeating back information and was advised to contact me if further medication-related questions arise. Patient was also provided an information handout.

## 2015-06-23 ENCOUNTER — Ambulatory Visit (INDEPENDENT_AMBULATORY_CARE_PROVIDER_SITE_OTHER): Payer: Self-pay | Admitting: Internal Medicine

## 2015-06-23 ENCOUNTER — Encounter: Payer: Self-pay | Admitting: Internal Medicine

## 2015-06-23 VITALS — BP 122/83 | HR 85 | Temp 98.0°F | Ht 73.0 in | Wt 251.7 lb

## 2015-06-23 DIAGNOSIS — L309 Dermatitis, unspecified: Secondary | ICD-10-CM

## 2015-06-23 DIAGNOSIS — R21 Rash and other nonspecific skin eruption: Secondary | ICD-10-CM

## 2015-06-23 DIAGNOSIS — I2699 Other pulmonary embolism without acute cor pulmonale: Secondary | ICD-10-CM

## 2015-06-23 DIAGNOSIS — I825Z1 Chronic embolism and thrombosis of unspecified deep veins of right distal lower extremity: Secondary | ICD-10-CM

## 2015-06-23 DIAGNOSIS — I87009 Postthrombotic syndrome without complications of unspecified extremity: Secondary | ICD-10-CM

## 2015-06-23 LAB — POCT INR: INR: 2.4

## 2015-06-23 NOTE — Assessment & Plan Note (Signed)
Assessment: No significant clinical change since his last visit 2 days ago on exam however he is complaining of more pain. He wishes to avoid any narcotic medicines given his history of past substance abuse. Recommended continuing a strength Tylenol up to 1 g 3 times daily and can add NSAIDs if he wants further pain control. The affected his steroids unable to be assessed since it has only been one day after first dose of prednisone was taken. Differential diagnosis still foremost at this time exacerbation of his general atopic dermatitis, dyshidrotic eczema, dermatitis herpetiformis. Still no evidence of infection and he has no systemic symptoms.   Plan: Continue prednisone 100 mg daily until next clinic visit in 5 days, likely to start taper down at 50 mg if there is response by that time Continue Diprolene 0.05% twice daily to affected areas Checking anti-TTG today Follow-up at 5 days to assess clinical progress Patient given instructions to contact clinic or ED again if he develops new systemic symptoms

## 2015-06-23 NOTE — Patient Instructions (Signed)
Time the most appropriate treatment is to continue 100 mg prednisone daily and continue applying topical steroid twice daily to the affected areas. We will plan to following up in clinic on Monday to see if symptoms are improving at that time. Continue taking extra strength Tylenol 2 tablets 3 times a day for pain. If this is not adequately controlling consider taking ibuprofen 800 mg 3 times a day as well. If this does not improve symptoms please call the clinic and we can prescribe an alternate treatment. [phone].  If they're worsening significantly or you develops new symptoms such as fevers, chills, or other systemic illness you may need to present to the emergency department over the weekend.

## 2015-06-23 NOTE — Progress Notes (Signed)
Anticoagulation Management [name] [name] is a 49 y.o. male who reports to the clinic for physician appointment and co-visit team-based warfarin monitoring.  Indication: DVThistory Duration: indefinite  Anticoagulation Clinic Visit History: Patient does not report signs/symptoms of bleeding or thromboembolism Other recent changes: on day #2 of prednisone therapy for eczema  Anticoagulation Episode Summary    Current INR goal 2.0-3.0  Next INR check 06/08/2015  INR from last check 1.8! (05/25/2015)  Most recent INR 2.4 (06/23/2015)  Weekly max dose   Target end date   INR check location   Preferred lab   Send INR reminders to [facility]   Indications  Long-term (current) use of anticoagulants [Z79.01] DVT (Resolved) [I82.409] Right leg DVT ([place]) [I82.401]        Comments       Anticoagulation Care Providers    Provider Role Specialty Phone number   Bartholomew Crews, MD  Internal Medicine [phone]     ASSESSMENT Recent Results: Recent results are below, the most recent result is correlated with a dose of 75 mg per week: Lab Results  Component Value Date   INR 2.4 06/23/2015   INR 1.8 05/25/2015   INR 1.2 05/12/2015   INR today: Therapeutic  Anticoagulation Dosing: INR as of 05/25/2015 and Previous Dosing Information    INR Dt INR Goal [name] Sun Mon Tue Wed Thu Fri Sat   05/25/2015 1.8 2.0-3.0 75 mg 10 mg 10 mg 10 mg 10 mg 12.5 mg 10 mg 12.5 mg    Anticoagulation Dose Instructions as of 05/25/2015      Total Sun Mon Tue Wed Thu Fri Sat   New Dose 75 mg 10 mg 10 mg 10 mg 10 mg 12.5 mg 10 mg 12.5 mg     (5 mg x 2)  (5 mg x 2)  (5 mg x 2)  (5 mg x 2)  (5 mg x 2.5)  (5 mg x 2)  (5 mg x 2.5)                           PLAN Weekly dose was unchanged. Patient education provided regarding potential for interaction between prednisone and warfarin. Patient advised to seek medical attention or contact clinic if signs/symptoms of bleeding  occur.  Follow-up Return in about 5 days (around 06/28/2015).  [name],[name]  15 minutes spent face-to-face with the patient during the encounter. 75% of time spent on education. 25% of time was spent on assessment and plan.

## 2015-06-23 NOTE — Progress Notes (Signed)
Subjective:   Patient ID: [name] [name] male   DOB: [birth year] 49 y.o.   MRN: [id number]  HPI: [name] is a 48 y.o. presenting to clinic at 2 day follow-up from recent evaluation for his worsening skin rash. He was initially unable to obtain his prednisone until yesterday when he took his first dose. He has noticed painful enlargement of blisters on his hands and feet particularly on the right palm. This pain is partially improved with extra strength Tylenol. There is some scab formation over the back of left hand from ruptured blisters.  See problem based assessment and plan below for additional details.  Past Medical History  Diagnosis Date  . DVT (deep venous thrombosis) (HCC)     on Coumadin Rx  . Substance abuse     crack, cocaine, last use 2007  . Tobacco abuse   . DVT (deep venous thrombosis) ([place])   . Bronchitis     last time 2 yrs ago  . Heart murmur   . Shortness of breath   . Tuberculosis     ' I test Positive "  . Asthma     uses Albuterol daily as needed  . Cough     smokers  . Arthritis     knees  . Joint pain   . GERD (gastroesophageal reflux disease)     only takes something about 2 times a yr  . Cellulitis   . Bipolar 1 disorder ([place])   . Alcohol abuse   . Pulmonary embolism ([place]) 2014  . Chronic dermatitis     due to Xarelto Rx  . Depression   . GERD (gastroesophageal reflux disease)    Current Outpatient Prescriptions  Medication Sig Dispense Refill  . albuterol (PROVENTIL HFA;VENTOLIN HFA) 108 (90 BASE) MCG/ACT inhaler Inhale 2 puffs into the lungs every 4 (four) hours as needed for wheezing or shortness of breath. 1 Inhaler 3  . augmented betamethasone dipropionate (DIPROLENE-AF) 0.05 % ointment Apply topically 2 (two) times daily. 340B patient, [place] [phone] 50 g 1  . cephALEXin (KEFLEX) 500 MG capsule Take 1 capsule (500 mg total) by mouth 2 (two) times daily. 14 capsule 0  . DULoxetine (CYMBALTA) 60 MG capsule Take 1 capsule (60 mg  total) by mouth daily. 90 capsule 3  . Fluticasone-Salmeterol (ADVAIR DISKUS) 500-50 MCG/DOSE AEPB Inhale 1 puff into the lungs 2 (two) times daily. 1 each 6  . furosemide (LASIX) 40 MG tablet Take 1 tablet (40 mg total) by mouth daily. 30 tablet 0  . gabapentin (NEURONTIN) 300 MG capsule Take 2 capsules (600 mg total) by mouth 3 (three) times daily. 180 capsule 3  . hydrocerin (EUCERIN) CREA Apply 1 application topically daily. 454 g 0  . hydrOXYzine (ATARAX/VISTARIL) 25 MG tablet Take 1 tablet (25 mg total) by mouth 3 (three) times daily as needed for itching. 30 tablet 6  . lidocaine (XYLOCAINE) 5 % ointment Apply 1 application topically daily. 35.44 g 2  . nicotine (NICODERM CQ - DOSED IN MG/24 HOURS) 14 mg/24hr patch Place 1 patch (14 mg total) onto the skin daily. 42 patch 0  . predniSONE (DELTASONE) 20 MG tablet Take 5 tablets (100 mg total) by mouth daily with breakfast. Taper as directed by physician 50 tablet 0  . traZODone (DESYREL) 100 MG tablet Take 1 tablet (100 mg total) by mouth at bedtime as needed for sleep. 14 tablet 5  . warfarin (COUMADIN) 5 MG tablet UPDATED DOSE: Take 2 tablets (10  mg) by mouth daily, except 2.5 tablets (12.5 mg) on Thursdays and Saturdays 65 tablet 5   No current facility-administered medications for this visit.   Family History  Problem Relation Age of Onset  . Asthma Mother   . Throat cancer Father    Social History   Social History  . Marital Status: Single    Spouse Name: N/A  . Number of Children: N/A  . Years of Education: N/A   Occupational History  . Details Cars    Social History Main Topics  . Smoking status: Current Every Day Smoker -- 0.25 packs/day for 25 years    Types: Cigarettes  . Smokeless tobacco: Former Systems developer     Comment: "stopped chewing in 1990s" 2 cigs per day. Quitting  . Alcohol Use: No     Comment: last alcohol intake 2 months ago per pt  . Drug Use: No     Comment: hx of-last time was 2 months ago per pt  .  Sexual Activity: Not Asked   Other Topics Concern  . None   Social History Narrative   Working at [employer] now unemployed.  States he has insurance through them now.    Lives with mom and step dad             Review of Systems: ROS  Constitutional: Negative for fever and chills.  HENT: Negative for sore throat.  Eyes: Negative for blurred vision and double vision.  Respiratory: Negative for shortness of breath.  Cardiovascular: Positive for leg swelling. Negative for chest pain.  Gastrointestinal: Positive for diarrhea. Negative for abdominal pain.  Genitourinary: Negative for hematuria.  Musculoskeletal: Negative for falls.  Skin: Positive for itching and rash.  Neurological: Negative for focal weakness, weakness and headaches.   Objective:  Physical Exam: Filed Vitals:   06/23/15 0859  BP: 122/83  Pulse: 85  Temp: 98 F (36.7 C)  TempSrc: Oral  Height: 6\' 1"  (1.854 m)  Weight: 251 lb 11.2 oz (114.17 kg)  SpO2: 98%   GENERAL- alert, co-operative, NAD CARDIAC- RRR, no murmurs, rubs or gallops. RESP- CTAB, no wheezes or crackles. EXTREMITIES- Tense edema of the right leg to the level of the knee, ulcers under dressing today, not tolerating shoes due to swelling and enlarged blisters of right foot. Bulla on right palm is approximately 3.5cm in diameter limiting ROM due to pain and skin tightness SKIN- a pruritic, papillary rash is present on extensor surfaces of elbows, forearms, hands knees, shins, feet, and buttocks with some excoriations and a few ruptured blisters. PSYCH- Normal mood and affect, appropriate thought content and speech.  Assessment & Plan:

## 2015-06-23 NOTE — Progress Notes (Signed)
Internal Medicine Clinic Attending  I saw and evaluated the patient.  I personally confirmed the key portions of the history and exam documented by Dr. Rice and I reviewed pertinent patient test results.  The assessment, diagnosis, and plan were formulated together and I agree with the documentation in the resident's note.  

## 2015-06-23 NOTE — Assessment & Plan Note (Signed)
INR monitored today at 2.4. Elevation is likely related to initiation of high-dose steroids. No changes to be made in his Coumadin dosing from this value today.

## 2015-06-28 ENCOUNTER — Ambulatory Visit (INDEPENDENT_AMBULATORY_CARE_PROVIDER_SITE_OTHER): Payer: Self-pay | Admitting: Internal Medicine

## 2015-06-28 ENCOUNTER — Encounter: Payer: Self-pay | Admitting: Internal Medicine

## 2015-06-28 VITALS — BP 134/84 | HR 66 | Temp 98.1°F | Ht 76.0 in | Wt 252.2 lb

## 2015-06-28 DIAGNOSIS — R21 Rash and other nonspecific skin eruption: Secondary | ICD-10-CM

## 2015-06-28 DIAGNOSIS — Z7901 Long term (current) use of anticoagulants: Secondary | ICD-10-CM

## 2015-06-28 DIAGNOSIS — Z86718 Personal history of other venous thrombosis and embolism: Secondary | ICD-10-CM

## 2015-06-28 DIAGNOSIS — L309 Dermatitis, unspecified: Secondary | ICD-10-CM

## 2015-06-28 DIAGNOSIS — I82401 Acute embolism and thrombosis of unspecified deep veins of right lower extremity: Secondary | ICD-10-CM

## 2015-06-28 LAB — POCT INR: INR: 2.9

## 2015-06-28 MED ORDER — PREDNISONE 20 MG PO TABS
20.0000 mg | ORAL_TABLET | Freq: Every day | ORAL | Status: DC
Start: 2015-06-28 — End: 2015-07-30

## 2015-06-28 NOTE — Progress Notes (Signed)
Subjective:   Patient ID: [name] [name] male   DOB: [birth year] 49 y.o.   MRN: [id number]  HPI: [name] is a 49 y.o. man presenting to clinic in a 5 day follow-up for management of his skin rash. This rash should been ongoing for weeks without improvement on triamcinolone 0.1% topical treatment. 6 days ago he started 100 mg oral prednisone and topical Diprolene twice daily due to his worsening pain, blistering, and skin breaks at that time. Since starting therapy he has noticed improvement in his pruritus and pain. Tense bullae on his hands and feet have decreased in size and pressure. He is able to tolerate wearing socks and shoes since 1 day ago and is no longer wearing a dressing for drainage from his right leg ulcer. He has not noticed any systemic symptoms such as fever, chills, insomnia, or change in swelling.  See problem based assessment and plan below for additional details.  Past Medical History  Diagnosis Date  . DVT (deep venous thrombosis) (HCC)     on Coumadin Rx  . Substance abuse     crack, cocaine, last use 2007  . Tobacco abuse   . DVT (deep venous thrombosis) ([place])   . Bronchitis     last time 2 yrs ago  . Heart murmur   . Shortness of breath   . Tuberculosis     ' I test Positive "  . Asthma     uses Albuterol daily as needed  . Cough     smokers  . Arthritis     knees  . Joint pain   . GERD (gastroesophageal reflux disease)     only takes something about 2 times a yr  . Cellulitis   . Bipolar 1 disorder ([place])   . Alcohol abuse   . Pulmonary embolism ([place]) 2014  . Chronic dermatitis     due to Xarelto Rx  . Depression   . GERD (gastroesophageal reflux disease)    Current Outpatient Prescriptions  Medication Sig Dispense Refill  . albuterol (PROVENTIL HFA;VENTOLIN HFA) 108 (90 BASE) MCG/ACT inhaler Inhale 2 puffs into the lungs every 4 (four) hours as needed for wheezing or shortness of breath. 1 Inhaler 3  . augmented betamethasone  dipropionate (DIPROLENE-AF) 0.05 % ointment Apply topically 2 (two) times daily. 340B patient, [place] [phone] 50 g 1  . DULoxetine (CYMBALTA) 60 MG capsule Take 1 capsule (60 mg total) by mouth daily. 90 capsule 3  . Fluticasone-Salmeterol (ADVAIR DISKUS) 500-50 MCG/DOSE AEPB Inhale 1 puff into the lungs 2 (two) times daily. 1 each 6  . furosemide (LASIX) 40 MG tablet Take 1 tablet (40 mg total) by mouth daily. 30 tablet 0  . gabapentin (NEURONTIN) 300 MG capsule Take 2 capsules (600 mg total) by mouth 3 (three) times daily. 180 capsule 3  . hydrocerin (EUCERIN) CREA Apply 1 application topically daily. 454 g 0  . hydrOXYzine (ATARAX/VISTARIL) 25 MG tablet Take 1 tablet (25 mg total) by mouth 3 (three) times daily as needed for itching. 30 tablet 6  . lidocaine (XYLOCAINE) 5 % ointment Apply 1 application topically daily. 35.44 g 2  . nicotine (NICODERM CQ - DOSED IN MG/24 HOURS) 14 mg/24hr patch Place 1 patch (14 mg total) onto the skin daily. 42 patch 0  . predniSONE (DELTASONE) 20 MG tablet Take 5 tablets (100 mg total) by mouth daily with breakfast. Taper as directed by physician 50 tablet 0  . traZODone (DESYREL) 100 MG tablet  Take 1 tablet (100 mg total) by mouth at bedtime as needed for sleep. 14 tablet 5  . warfarin (COUMADIN) 5 MG tablet UPDATED DOSE: Take 2 tablets (10 mg) by mouth daily, except 2.5 tablets (12.5 mg) on Thursdays and Saturdays 65 tablet 5   No current facility-administered medications for this visit.   Family History  Problem Relation Age of Onset  . Asthma Mother   . Throat cancer Father    Social History   Social History  . Marital Status: Single    Spouse Name: N/A  . Number of Children: N/A  . Years of Education: N/A   Occupational History  . Details Cars    Social History Main Topics  . Smoking status: Current Every Day Smoker -- 0.25 packs/day for 25 years    Types: Cigarettes  . Smokeless tobacco: Former Systems developer     Comment: "stopped chewing in  1990s" 2 cigs per day. Quitting  . Alcohol Use: No     Comment: last alcohol intake 2 months ago per pt  . Drug Use: No     Comment: hx of-last time was 2 months ago per pt  . Sexual Activity: Not on file   Other Topics Concern  . Not on file   Social History Narrative   Working at [employer] now unemployed.  States he has insurance through them now.    Lives with mom and step dad             Review of Systems: Review of Systems  Constitutional: Negative for fever and chills.  Eyes: Negative for blurred vision and double vision.  Respiratory: Negative for shortness of breath.   Cardiovascular: Positive for leg swelling.  Gastrointestinal: Negative for nausea and abdominal pain.  Skin: Positive for itching and rash.  Neurological: Negative for dizziness, weakness and headaches.  Psychiatric/Behavioral: The patient is not nervous/anxious and does not have insomnia.     Objective:  Physical Exam: Filed Vitals:   06/28/15 1325  BP: 134/84  Pulse: 66  Temp: 98.1 F (36.7 C)  TempSrc: Oral  Height: 6\' 4"  (1.93 m)  Weight: 252 lb 3.2 oz (114.397 kg)  SpO2: 100%   GENERAL- alert, co-operative, NAD CARDIAC- RRR, no murmurs, rubs or gallops. RESP- CTAB, no wheezes or crackles. EXTREMITIES- Right leg edema and ulceration appears dry, Bulla on right palm that does not limit ROM of digits, weight bearing on both feet but still carrying cane for right leg pain SKIN- a pruritic, papillary rash is present on extensor surfaces of elbows, forearms, hands knees, shins, feet, and buttocks with some excoriations, several blisters with spontaneous deroofing and mostly intact skin beneath PSYCH- Normal mood and affect, appropriate thought content and speech.  Assessment & Plan:

## 2015-06-28 NOTE — Assessment & Plan Note (Signed)
Assessment: He is making significant clinical improvement since his last visit 6 days ago. His pain is adequately controlled on extra strength Tylenol at this time. He is on day 6 of 100 mg prednisone orally. His main areas of complaint particularly the right palm and seepage at the right leg ulcer are significantly improved. Small papillae are largely resolving leaving intact skin, while larger blisters have decreased in size tenderness and tension. His dermatitis is vastly improved on steroids, we'll need to taper these down in order to avoid adverse effects. Also need caution since atopic dermatitis treated with systemic steroids is extremely likely to rebound and worsen with steroid withdrawal. He may need a stronger maintenance therapy available than the triamcinolone 0.1% topical steroid. Systemic steroids avoided any emergency department evaluation or hospital visits but are not optimal management of recurrent severe atopic dermatitis exacerbations.  He is also scheduled for a appointment at [facility] tomorrow morning and will appreciate their recommendations on management.  Plan: Decrease prednisone to 60 mg for 3 days then 40 mg for 3 days then continue 20 mg until clinic follow-up appointment Continued to complain 0.05% twice daily on affected areas Aim to follow up in about 2 weeks

## 2015-06-28 NOTE — Patient Instructions (Addendum)
I'm glad to see that your skin rash is getting better with our treatment. Now that was seeing a response I would like to start decreasing your prednisone dose to minimize the chance of side effects with this medicine. You should not stop it all at once that you can get a rebound and worsening of your rash. I recommend decreasing her dose as follows: Take 3 tablets (60 mg) daily for 3 days Then take 2 tablets (40 mg) daily for 3 days Then continue taking 1 tablet (20 mg) daily until you're able to follow up with [name] in clinic again  You should continue using Diprolene (topical steroid) every day as directed while you continued to have a rash.  We'll provide a new prescription medication at the [facility] outpatient pharmacy to refill when your current one is finished.  Please follow up with our clinic in approximately 2-3 weeks or sooner if you feel that he have new symptoms or your skin rashes worsening instead of improving. [phone].

## 2015-06-29 NOTE — Progress Notes (Signed)
Internal Medicine Clinic Attending  I saw and evaluated the patient.  I personally confirmed the key portions of the history and exam documented by Dr. Rice and I reviewed pertinent patient test results.  The assessment, diagnosis, and plan were formulated together and I agree with the documentation in the resident's note.  

## 2015-06-29 NOTE — Progress Notes (Signed)
Anticoagulation Management [name] [name] is a 49 y.o. male who reports to the clinic for monitoring of warfarin treatment.    Indication: DVThistory Duration: indefinite  Anticoagulation Clinic Visit History: Anticoagulation Episode Summary    Current INR goal 2.0-3.0  Next INR check 08/02/2015  INR from last check 2.9 (06/28/2015)  Weekly max dose   Target end date   INR check location   Preferred lab   Send INR reminders to [facility]AG [facility]   Indications  Long-term (current) use of anticoagulants [Z79.01] DVT (Resolved) [I82.409] Right leg DVT ([place]) [I82.401]        Comments       Anticoagulation Care Providers    Provider Role Specialty Phone number   Bartholomew Crews, MD  Internal Medicine [phone]     ASSESSMENT Recent Results: Recent results are below, the most recent result is correlated with a dose of 75 mg per week: Lab Results  Component Value Date   INR 2.9 06/28/2015   INR 2.4 06/23/2015   INR 1.8 05/25/2015   INR today: Therapeutic  Anticoagulation Dosing: INR as of 06/28/2015 and Previous Dosing Information    INR Dt INR Goal [name] Sun Mon Tue Wed Thu Fri Sat   06/28/2015 2.9 2.0-3.0 75 mg 10 mg 10 mg 10 mg 10 mg 12.5 mg 10 mg 12.5 mg    Anticoagulation Dose Instructions as of 06/28/2015      Total Sun Mon Tue Wed Thu Fri Sat   New Dose 75 mg 10 mg 10 mg 10 mg 10 mg 12.5 mg 10 mg 12.5 mg     (5 mg x 2)  (5 mg x 2)  (5 mg x 2)  (5 mg x 2)  (5 mg x 2.5)  (5 mg x 2)  (5 mg x 2.5)                           PLAN Weekly dose was unchanged, tapering steroid dose. Patient advised to continue monitoring for sign/symptoms of bleeding and to contact clinic if any concerns arise.  [name] [name] J

## 2015-06-29 NOTE — Addendum Note (Signed)
Addended by: Forde Dandy on: 06/29/2015 03:11 PM   Modules accepted: Orders

## 2015-07-02 ENCOUNTER — Other Ambulatory Visit: Payer: Self-pay

## 2015-07-02 DIAGNOSIS — I83009 Varicose veins of unspecified lower extremity with ulcer of unspecified site: Secondary | ICD-10-CM

## 2015-07-02 DIAGNOSIS — L97909 Non-pressure chronic ulcer of unspecified part of unspecified lower leg with unspecified severity: Principal | ICD-10-CM

## 2015-07-21 ENCOUNTER — Encounter (HOSPITAL_BASED_OUTPATIENT_CLINIC_OR_DEPARTMENT_OTHER): Payer: No Typology Code available for payment source | Attending: Surgery

## 2015-07-21 DIAGNOSIS — L309 Dermatitis, unspecified: Secondary | ICD-10-CM | POA: Insufficient documentation

## 2015-07-21 DIAGNOSIS — I890 Lymphedema, not elsewhere classified: Secondary | ICD-10-CM | POA: Insufficient documentation

## 2015-07-21 DIAGNOSIS — I82501 Chronic embolism and thrombosis of unspecified deep veins of right lower extremity: Secondary | ICD-10-CM | POA: Insufficient documentation

## 2015-07-23 ENCOUNTER — Encounter (HOSPITAL_BASED_OUTPATIENT_CLINIC_OR_DEPARTMENT_OTHER): Payer: Self-pay

## 2015-07-23 ENCOUNTER — Encounter: Payer: Self-pay | Admitting: Vascular Surgery

## 2015-07-30 ENCOUNTER — Ambulatory Visit (HOSPITAL_COMMUNITY)
Admission: RE | Admit: 2015-07-30 | Discharge: 2015-07-30 | Disposition: A | Discharge status: Home / Self Care | Payer: No Typology Code available for payment source | Source: Ambulatory Visit | Attending: Vascular Surgery | Admitting: Vascular Surgery

## 2015-07-30 ENCOUNTER — Encounter: Payer: Self-pay | Admitting: Vascular Surgery

## 2015-07-30 ENCOUNTER — Ambulatory Visit (INDEPENDENT_AMBULATORY_CARE_PROVIDER_SITE_OTHER): Payer: No Typology Code available for payment source | Admitting: Vascular Surgery

## 2015-07-30 VITALS — BP 124/87 | HR 85 | Temp 97.8°F | Resp 16 | Ht 76.0 in | Wt 250.0 lb

## 2015-07-30 DIAGNOSIS — I872 Venous insufficiency (chronic) (peripheral): Secondary | ICD-10-CM

## 2015-07-30 DIAGNOSIS — F17200 Nicotine dependence, unspecified, uncomplicated: Secondary | ICD-10-CM | POA: Insufficient documentation

## 2015-07-30 DIAGNOSIS — I82401 Acute embolism and thrombosis of unspecified deep veins of right lower extremity: Secondary | ICD-10-CM | POA: Insufficient documentation

## 2015-07-30 DIAGNOSIS — L97909 Non-pressure chronic ulcer of unspecified part of unspecified lower leg with unspecified severity: Secondary | ICD-10-CM

## 2015-07-30 DIAGNOSIS — I83009 Varicose veins of unspecified lower extremity with ulcer of unspecified site: Secondary | ICD-10-CM

## 2015-07-30 DIAGNOSIS — I87009 Postthrombotic syndrome without complications of unspecified extremity: Secondary | ICD-10-CM

## 2015-07-30 DIAGNOSIS — I8393 Asymptomatic varicose veins of bilateral lower extremities: Secondary | ICD-10-CM | POA: Insufficient documentation
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
## 2015-08-02 ENCOUNTER — Other Ambulatory Visit: Payer: Self-pay | Admitting: *Deleted

## 2015-08-02 ENCOUNTER — Encounter: Payer: Self-pay | Admitting: Internal Medicine

## 2015-08-03 ENCOUNTER — Encounter: Payer: Self-pay | Admitting: Internal Medicine

## 2015-08-03 ENCOUNTER — Ambulatory Visit (INDEPENDENT_AMBULATORY_CARE_PROVIDER_SITE_OTHER): Payer: Self-pay | Admitting: Internal Medicine

## 2015-08-03 VITALS — BP 119/88 | HR 76 | Temp 98.4°F | Ht 76.0 in | Wt 251.8 lb

## 2015-08-03 DIAGNOSIS — I872 Venous insufficiency (chronic) (peripheral): Secondary | ICD-10-CM

## 2015-08-03 DIAGNOSIS — Z7901 Long term (current) use of anticoagulants: Secondary | ICD-10-CM

## 2015-08-03 DIAGNOSIS — I82401 Acute embolism and thrombosis of unspecified deep veins of right lower extremity: Secondary | ICD-10-CM

## 2015-08-03 DIAGNOSIS — I8312 Varicose veins of left lower extremity with inflammation: Secondary | ICD-10-CM

## 2015-08-03 DIAGNOSIS — I8311 Varicose veins of right lower extremity with inflammation: Secondary | ICD-10-CM

## 2015-08-03 LAB — POCT INR: INR: 1.4

## 2015-08-03 NOTE — Patient Instructions (Signed)
General Instructions:  Please call if you develop worsening pain, fever or chills, otherwise we will need to see you at least weekly to reapply the unna boot until healed.  Please bring your medicines with you each time you come to clinic.  Medicines may include prescription medications, over-the-counter medications, herbal remedies, eye drops, vitamins, or other pills.   Progress Toward Treatment Goals:  Treatment Goal 04/07/2013  Stop smoking smoking less    Self Care Goals & Plans:  Self Care Goal 08/03/2015  Manage my medications take my medicines as prescribed; bring my medications to every visit; refill my medications on time  Monitor my health -  Eat healthy foods eat more vegetables; eat foods that are low in salt; eat baked foods instead of fried foods  Be physically active find an activity I enjoy  Stop smoking -    No flowsheet data found.   Care Management & Community Referrals:  Referral 01/02/2013  Referrals made for care management support none needed

## 2015-08-03 NOTE — Assessment & Plan Note (Signed)
Check INR 

## 2015-08-03 NOTE — Progress Notes (Signed)
[facility] Subjective:   Patient ID: [name] [name] male   DOB: 1966/03/04 50 y.o.   MRN: [id number]  HPI: [name] [name] is a 50 y.o. male with a PMH detailed below who presents for RLE pain and dermitis.  He has a history of DVTs and chronic venous insufficiency with the right leg worse than the left.  He reports that about 3 months ago the swelling in his right leg became worse and has had intermittent ulcers of the area.  He notes that he saw Dermatology 3 weeks ago and they did a superficial culture of the area and reported that he had a staph infection and prescribed him keflex.  He has been taking 500mg   QID for the last 3 weeks without much improvement of the area.  He did see vascular surgery last week and had repeat dopplers which shows chronic thrombus and reflux of the deep vein system on the right.  He was recommened to use compression stockings but he has been unable to do that due to pain on placing the stockings. He has had UNNA boots placed in the past, always with great success.     Past Medical History  Diagnosis Date  . DVT (deep venous thrombosis) (HCC)     on Coumadin Rx  . Substance abuse     crack, cocaine, last use 2007  . Tobacco abuse   . DVT (deep venous thrombosis) ([place])   . Bronchitis     last time 2 yrs ago  . Heart murmur   . Shortness of breath   . Tuberculosis     ' I test Positive "  . Asthma     uses Albuterol daily as needed  . Cough     smokers  . Arthritis     knees  . Joint pain   . GERD (gastroesophageal reflux disease)     only takes something about 2 times a yr  . Cellulitis   . Bipolar 1 disorder ([place])   . Alcohol abuse   . Pulmonary embolism ([place]) 2014  . Chronic dermatitis     due to Xarelto Rx  . Depression   . GERD (gastroesophageal reflux disease)    Current Outpatient Prescriptions  Medication Sig Dispense Refill  . albuterol (PROVENTIL HFA;VENTOLIN HFA) 108 (90 BASE) MCG/ACT inhaler Inhale 2  puffs into the lungs every 4 (four) hours as needed for wheezing or shortness of breath. 1 Inhaler 3  . augmented betamethasone dipropionate (DIPROLENE-AF) 0.05 % ointment Apply topically 2 (two) times daily. 340B patient, NDC [phone] 50 g 1  . cephALEXin (KEFLEX) 500 MG capsule Take 500 mg by mouth 4 (four) times daily - after meals and at bedtime.    . DULoxetine (CYMBALTA) 60 MG capsule Take 1 capsule (60 mg total) by mouth daily. 90 capsule 3  . Fluticasone-Salmeterol (ADVAIR DISKUS) 500-50 MCG/DOSE AEPB Inhale 1 puff into the lungs 2 (two) times daily. 1 each 6  . furosemide (LASIX) 40 MG tablet Take 1 tablet (40 mg total) by mouth daily. 30 tablet 0  . gabapentin (NEURONTIN) 300 MG capsule Take 2 capsules (600 mg total) by mouth 3 (three) times daily. 180 capsule 3  . hydrocerin (EUCERIN) CREA Apply 1 application topically daily. 454 g 0  . hydrOXYzine (ATARAX/VISTARIL) 25 MG tablet Take 1 tablet (25 mg total) by mouth 3 (three) times daily as needed for itching. 30 tablet 6  . lidocaine (XYLOCAINE) 5 % ointment Apply 1  application topically daily. (Patient not taking: Reported on 07/30/2015) 35.44 g 2  . nicotine (NICODERM CQ - DOSED IN MG/24 HOURS) 14 mg/24hr patch Place 1 patch (14 mg total) onto the skin daily. 42 patch 0  . traZODone (DESYREL) 100 MG tablet Take 1 tablet (100 mg total) by mouth at bedtime as needed for sleep. 14 tablet 5  . warfarin (COUMADIN) 5 MG tablet UPDATED DOSE: Take 2 tablets (10 mg) by mouth daily, except 2.5 tablets (12.5 mg) on Thursdays and Saturdays 65 tablet 5   No current facility-administered medications for this visit.   Family History  Problem Relation Age of Onset  . Asthma Mother   . Throat cancer Father    Social History   Social History  . Marital Status: Single    Spouse Name: N/A  . Number of Children: N/A  . Years of Education: N/A   Occupational History  . Details Cars    Social History Main Topics  . Smoking status: Light  Tobacco Smoker -- 0.25 packs/day for 25 years    Types: Cigarettes  . Smokeless tobacco: Former Systems developer     Comment: "stopped chewing in 1990s" 2 cigs per day. Quitting  . Alcohol Use: No     Comment: last alcohol intake 2 months ago per pt  . Drug Use: No     Comment: hx of-last time was 2 months ago per pt  . Sexual Activity: Not Asked   Other Topics Concern  . None   Social History Narrative   Working at [employer] now unemployed.  States he has insurance through them now.    Lives with mom and step dad             Review of Systems: Review of Systems  Constitutional: Negative for fever, chills and malaise/fatigue.  Respiratory: Negative for cough.   Cardiovascular: Negative for chest pain.  Skin: Negative for itching.  Neurological: Negative for tingling and sensory change.     Objective:  Physical Exam: Filed Vitals:   08/03/15 1409  BP: 119/88  Pulse: 76  Temp: 98.4 F (36.9 C)  TempSrc: Oral  Height: 6\' 4"  (1.93 m)  Weight: 251 lb 12.8 oz (114.216 kg)  SpO2: 98%  Physical Exam  Constitutional: He is well-developed, well-nourished, and in no distress.  Cardiovascular: Intact distal pulses.   dp and pt pulses palpable bilaterally  Musculoskeletal: He exhibits edema.  Able to ambulate across room, normal gait  Skin:  He has chronic venious stasis dermatitis bilaterally with the right side worse than the left.  The circumference of his RLE is larger than the left, he has no open ulcers.  There is tenderness to palpation of the anterior right ankle but no difference in warmth or redness in the area, these is no appreciated fluid collection or abscess.  Psychiatric: Affect normal.  Nursing note and vitals reviewed.   Assessment & Plan:  Case discussed and patient  seen with Dr. Daryll Drown  Chronic venous insufficiency A: Chronic venous insufficiency, right worse than left  P: - Need compression therapy for healing however too painful right now for  compression stockings - Will place UNNA boot to right lower extremity today and have patient follow up weekly  Chronic venous stasis dermatitis of both lower extremities - UNNA boot to RLE, follow up weekly until able to tolerate compression stockings - No obvious signs of superimposed bacterial infection, would not escalate antibiotics and can consider d/c.  Long-term (current) use  of anticoagulants - Check INR    Medications Ordered No orders of the defined types were placed in this encounter.   Other Orders Orders Placed This Encounter  Procedures  . Apply unna boot    Standing Status: Standing     Number of Occurrences: 1     Standing Expiration Date:     Order Specific Question:  Laterality    Answer:  Right  . Maintain [employer]    Standing Status: Standing     Number of Occurrences: 1     Standing Expiration Date:     Order Specific Question:  Laterality    Answer:  Right  . POCT INR   Follow Up: Return in about 1 week (around 08/10/2015).

## 2015-08-03 NOTE — Assessment & Plan Note (Signed)
-   UNNA boot to RLE, follow up weekly until able to tolerate compression stockings - No obvious signs of superimposed bacterial infection, would not escalate antibiotics and can consider d/c.

## 2015-08-03 NOTE — Assessment & Plan Note (Signed)
A: Chronic venous insufficiency, right worse than left  P: - Need compression therapy for healing however too painful right now for compression stockings - Will place UNNA boot to right lower extremity today and have patient follow up weekly

## 2015-08-03 NOTE — Progress Notes (Signed)
Anticoagulation Management [name] [name] is a 50 y.o. male who reports to the clinic for monitoring of warfarin treatment.    Indication: DVT Duration: indefinite  Anticoagulation Clinic Visit History: Patient does not report signs/symptoms of bleeding or thromboembolism. Patient states he ran out of medication and missed 2 doses in the past week.  Anticoagulation Episode Summary    Current INR goal 2.0-3.0  Next INR check 08/11/2015  INR from last check   Weekly max dose   Target end date   INR check location   Preferred lab   Send INR reminders to [facility]   Indications  Long-term (current) use of anticoagulants [Z79.01] DVT (Resolved) [I82.409] Right leg DVT ([place]) [I82.401]        Comments       Anticoagulation Care Providers    Provider Role Specialty Phone number   Bartholomew Crews, MD  Internal Medicine [phone]     ASSESSMENT Recent Results: Recent results are below, the most recent result is correlated with a dose of 75 mg per week: Lab Results  Component Value Date   INR 1.4 08/03/2015   INR 2.9 06/28/2015   INR 2.4 06/23/2015    INR today: Subtherapeutic  Anticoagulation Dosing: INR as of 08/03/2015 and Previous Dosing Information    INR Dt INR Goal Wkly Tot Sun Mon Tue Wed Thu Fri Sat     2.0-3.0 75 mg 10 mg 10 mg 10 mg 10 mg 12.5 mg 10 mg 12.5 mg    Anticoagulation Dose Instructions as of 08/03/2015      Total Sun Mon Tue Wed Thu Fri Sat   New Dose 75 mg 10 mg 10 mg 12.5 mg 10 mg 10 mg 10 mg 12.5 mg     (5 mg x 2)  (5 mg x 2)  (5 mg x 2.5)  (5 mg x 2)  (5 mg x 2)  (5 mg x 2)  (5 mg x 2.5)                           PLAN Weekly dose was unchanged due to patient missing doses. Reinforced importance of adherence.  Patient advised to contact clinic or seek medical attention if signs/symptoms of bleeding or thromboembolism occur.  Follow-up Return in about 1 week (around 08/10/2015).  [name],[name]  15 minutes spent face-to-face  with the patient during the encounter. 50% of time spent on education. 50% of time was spent on assessment and plan.

## 2015-08-04 NOTE — Progress Notes (Signed)
Internal Medicine Clinic Attending  I saw and evaluated the patient.  I personally confirmed the key portions of the history and exam documented by Dr. Hoffman and I reviewed pertinent patient test results.  The assessment, diagnosis, and plan were formulated together and I agree with the documentation in the resident's note.      

## 2015-08-05 MED ORDER — TRAZODONE HCL 100 MG PO TABS: 100.0000 mg | ORAL_TABLET | Freq: Every evening | ORAL | Status: DC | PRN

## 2015-08-10 ENCOUNTER — Encounter: Payer: Self-pay | Admitting: Internal Medicine

## 2015-08-10 ENCOUNTER — Ambulatory Visit (INDEPENDENT_AMBULATORY_CARE_PROVIDER_SITE_OTHER): Payer: Self-pay | Admitting: Internal Medicine

## 2015-08-10 VITALS — BP 131/91 | HR 71 | Temp 97.7°F | Wt 253.6 lb

## 2015-08-10 DIAGNOSIS — I82401 Acute embolism and thrombosis of unspecified deep veins of right lower extremity: Secondary | ICD-10-CM

## 2015-08-10 DIAGNOSIS — Z7901 Long term (current) use of anticoagulants: Secondary | ICD-10-CM

## 2015-08-10 DIAGNOSIS — I872 Venous insufficiency (chronic) (peripheral): Secondary | ICD-10-CM

## 2015-08-10 LAB — POCT INR: INR: 1.6

## 2015-08-10 NOTE — Patient Instructions (Signed)
General Instructions:   Please bring your medicines with you each time you come to clinic.  Medicines may include prescription medications, over-the-counter medications, herbal remedies, eye drops, vitamins, or other pills.   Progress Toward Treatment Goals:  Treatment Goal 04/07/2013  Stop smoking smoking less    Self Care Goals & Plans:  Self Care Goal 08/03/2015  Manage my medications take my medicines as prescribed; bring my medications to every visit; refill my medications on time  Monitor my health -  Eat healthy foods eat more vegetables; eat foods that are low in salt; eat baked foods instead of fried foods  Be physically active find an activity I enjoy  Stop smoking -    No flowsheet data found.   Care Management & Community Referrals:  Referral 01/02/2013  Referrals made for care management support none needed

## 2015-08-10 NOTE — Progress Notes (Signed)
Anticoagulation Management Tory A Kroeker is a 50 y.o. male who reports to the clinic for monitoring of warfarin treatment.    Indication: DVT Duration: indefinite  Anticoagulation Clinic Visit History: Patient does report signs/symptoms thromboembolism swollen leg. Swelling has reduced since last visit. Anticoagulation Episode Summary    Current INR goal 2.0-3.0  Next INR check 08/11/2015  INR from last check   Most recent INR 1.6! (08/10/2015)  Weekly max dose   Target end date   INR check location   Preferred lab   Send INR reminders to [facility]   Indications  Long-term (current) use of anticoagulants [Z79.01] DVT (Resolved) [I82.409] Right leg DVT ([place]) [I82.401]        Comments       Anticoagulation Care Providers    Provider Role Specialty Phone number   Bartholomew Crews, MD  Internal Medicine [phone]     ASSESSMENT Recent Results: Recent results are below, the most recent result is correlated with a dose of 75 mg per week: Lab Results  Component Value Date   INR 1.6 08/10/2015   INR 1.4 08/03/2015   INR 2.9 06/28/2015    INR today: Subtherapeutic  Anticoagulation Dosing: INR as of 08/03/2015 and Previous Dosing Information    INR Dt INR Goal Wkly Tot Sun Mon Tue Wed Thu Fri Sat     2.0-3.0 75 mg 10 mg 10 mg 10 mg 10 mg 12.5 mg 10 mg 12.5 mg    Anticoagulation Dose Instructions as of 08/03/2015      Total Sun Mon Tue Wed Thu Fri Sat   New Dose 75 mg 10 mg 10 mg 12.5 mg 10 mg 10 mg 10 mg 12.5 mg     (5 mg x 2)  (5 mg x 2)  (5 mg x 2.5)  (5 mg x 2)  (5 mg x 2)  (5 mg x 2)  (5 mg x 2.5)                           PLAN Weekly dose was increased by 10% to 82.5 mg per week  Patient Instructions   General Instructions:   Please bring your medicines with you each time you come to clinic.  Medicines may include prescription medications, over-the-counter medications, herbal remedies, eye drops, vitamins, or other pills.   Progress Toward  Treatment Goals:  Treatment Goal 04/07/2013  Stop smoking smoking less    Self Care Goals & Plans:  Self Care Goal 08/03/2015  Manage my medications take my medicines as prescribed; bring my medications to every visit; refill my medications on time  Monitor my health -  Eat healthy foods eat more vegetables; eat foods that are low in salt; eat baked foods instead of fried foods  Be physically active find an activity I enjoy  Stop smoking -    No flowsheet data found.   Care Management & Community Referrals:  Referral 01/02/2013  Referrals made for care management support none needed        The patient will be taking lovenox injections 150mg  qd until follow-up.  Patient advised to contact clinic or seek medical attention if signs/symptoms of bleeding or thromboembolism occur.  Follow-up Return in about 3 days (around 08/13/2015).  15 minutes spent face-to-face with the patient during the encounter. 75% of time spent on education. 25% of time was spent on assessment and plan.   Medication Samples have been provided to the patient.  Drug name: Lovenox Injection 150mg /18mL  Qty: 4  [id number]  Exp.Date: 07/2016  The patient has been instructed regarding the correct time, dose, and frequency of taking this medication, including desired effects and most common side effects. The patient took the first dose in clinic and demonstrated proper administration technique. The patient was advised to report symptoms of bleeding, bruising, or clotting.   Juanell Fairly 3:23 PM 08/10/2015

## 2015-08-10 NOTE — Progress Notes (Signed)
[facility] Subjective:   Patient ID: [name] [name] male   DOB: [birth year] 50 y.o.   MRN: [id number]  HPI: [name] [name] is a 50 y.o. male with a PMH detailed below who presents for 1 week follow up of RLE swelling and pain.  Please see problem based charting below for the status of his chronic medical problems.    Past Medical History  Diagnosis Date  . DVT (deep venous thrombosis) (HCC)     on Coumadin Rx  . Substance abuse     crack, cocaine, last use 2007  . Tobacco abuse   . DVT (deep venous thrombosis) ([place])   . Bronchitis     last time 2 yrs ago  . Heart murmur   . Shortness of breath   . Tuberculosis     ' I test Positive "  . Asthma     uses Albuterol daily as needed  . Cough     smokers  . Arthritis     knees  . Joint pain   . GERD (gastroesophageal reflux disease)     only takes something about 2 times a yr  . Cellulitis   . Bipolar 1 disorder ([place])   . Alcohol abuse   . Pulmonary embolism ([place]) 2014  . Chronic dermatitis     due to Xarelto Rx  . Depression   . GERD (gastroesophageal reflux disease)    Current Outpatient Prescriptions  Medication Sig Dispense Refill  . albuterol (PROVENTIL HFA;VENTOLIN HFA) 108 (90 BASE) MCG/ACT inhaler Inhale 2 puffs into the lungs every 4 (four) hours as needed for wheezing or shortness of breath. 1 Inhaler 3  . augmented betamethasone dipropionate (DIPROLENE-AF) 0.05 % ointment Apply topically 2 (two) times daily. 340B patient, NDC [phone] 50 g 1  . cephALEXin (KEFLEX) 500 MG capsule Take 500 mg by mouth 4 (four) times daily - after meals and at bedtime.    . DULoxetine (CYMBALTA) 60 MG capsule Take 1 capsule (60 mg total) by mouth daily. 90 capsule 3  . Fluticasone-Salmeterol (ADVAIR DISKUS) 500-50 MCG/DOSE AEPB Inhale 1 puff into the lungs 2 (two) times daily. 1 each 6  . furosemide (LASIX) 40 MG tablet Take 1 tablet (40 mg total) by mouth daily. 30 tablet 0  . gabapentin (NEURONTIN)  300 MG capsule Take 2 capsules (600 mg total) by mouth 3 (three) times daily. 180 capsule 3  . hydrocerin (EUCERIN) CREA Apply 1 application topically daily. 454 g 0  . hydrOXYzine (ATARAX/VISTARIL) 25 MG tablet Take 1 tablet (25 mg total) by mouth 3 (three) times daily as needed for itching. 30 tablet 6  . lidocaine (XYLOCAINE) 5 % ointment Apply 1 application topically daily. (Patient not taking: Reported on 07/30/2015) 35.44 g 2  . nicotine (NICODERM CQ - DOSED IN MG/24 HOURS) 14 mg/24hr patch Place 1 patch (14 mg total) onto the skin daily. 42 patch 0  . traZODone (DESYREL) 100 MG tablet Take 1 tablet (100 mg total) by mouth at bedtime as needed for sleep. 14 tablet 5  . warfarin (COUMADIN) 5 MG tablet UPDATED DOSE: Take 2 tablets (10 mg) by mouth daily, except 2.5 tablets (12.5 mg) on Thursdays and Saturdays 65 tablet 5   No current facility-administered medications for this visit.   Family History  Problem Relation Age of Onset  . Asthma Mother   . Throat cancer Father    Social History   Social History  . Marital Status: Single  Spouse Name: N/A  . Number of Children: N/A  . Years of Education: N/A   Occupational History  . Details Cars    Social History Main Topics  . Smoking status: Light Tobacco Smoker -- 0.25 packs/day for 25 years    Types: Cigarettes  . Smokeless tobacco: Former Systems developer     Comment: "stopped chewing in 1990s" 2 cigs per day. Quitting  . Alcohol Use: No     Comment: last alcohol intake 2 months ago per pt  . Drug Use: No     Comment: hx of-last time was 2 months ago per pt  . Sexual Activity: Not Asked   Other Topics Concern  . None   Social History Narrative   Working at [employer] now unemployed.  States he has insurance through them now.    Lives with mom and step dad             Review of Systems: Review of Systems  Constitutional: Negative for fever and chills.  Cardiovascular: Positive for leg swelling (improved). Negative  for claudication.  Musculoskeletal: Negative for falls.     Objective:  Physical Exam: Filed Vitals:   08/10/15 0904  BP: 131/91  Pulse: 71  Temp: 97.7 F (36.5 C)  TempSrc: Oral  Weight: 253 lb 9.6 oz (115.032 kg)  SpO2: 100%  Physical Exam  Constitutional: He is well-developed, well-nourished, and in no distress.  Musculoskeletal: He exhibits edema (1+ RLE).  Skin:  Chronic venous stasis changes of right lower extremity, no redness or warmth, small areas superficial skin breakdown in multiple areas  Nursing note and vitals reviewed.   Assessment & Plan:  Case discussed with Dr. Lynnae January  Chronic venous insufficiency HPI: He has a hitory of chronic venous insufficency due to previous DVT, his right leg is worse than the left.  He was seen one week ago for increase pain in right foot and unable to put on compression stockings.  We applied an UNNA boot to the right foot.  He reports this has helped to decrease the pain in his right foot/ankle area.  He still does not think he could tolerate an placing a compression stockings at this time.  A: Chronic venous insuffiency  P: - Reapply Unna boot, follow up in 1 week, hopefully at this point he will be able to tolerate compression stockings which is the long term goal.  Long-term (current) use of anticoagulants HPI: On warfarin due to multiple DVTs.  For last week has had 100% complaince with warfarin  A: Long term use of A/C, subtheraputic INR  P: Repeat INR subtheraputic, adjustments made per pharmacy note.  Given ongoing left foot pain will bridge with lovenox to ensure he is A/C.  He has not SOB or tachycardia to suggest PE and his LE edema is improved.    Medications Ordered No orders of the defined types were placed in this encounter.   Other Orders Orders Placed This Encounter  Procedures  . Apply unna boot    Standing Status: Standing     Number of Occurrences: 1     Standing Expiration Date:     Order Specific  Question:  Laterality    Answer:  Right  . Maintain [employer]    Standing Status: Standing     Number of Occurrences: 1     Standing Expiration Date:     Order Specific Question:  Laterality    Answer:  Right  . POCT INR  Follow Up: Return in about 1 week (around 08/17/2015).

## 2015-08-12 NOTE — Assessment & Plan Note (Signed)
HPI: On warfarin due to multiple DVTs.  For last week has had 100% complaince with warfarin  A: Long term use of A/C, subtheraputic INR  P: Repeat INR subtheraputic, adjustments made per pharmacy note.  Given ongoing left foot pain will bridge with lovenox to ensure he is A/C.  He has not SOB or tachycardia to suggest PE and his LE edema is improved.

## 2015-08-12 NOTE — Assessment & Plan Note (Signed)
HPI: He has a hitory of chronic venous insufficency due to previous DVT, his right leg is worse than the left.  He was seen one week ago for increase pain in right foot and unable to put on compression stockings.  We applied an UNNA boot to the right foot.  He reports this has helped to decrease the pain in his right foot/ankle area.  He still does not think he could tolerate an placing a compression stockings at this time.  A: Chronic venous insuffiency  P: - Reapply Unna boot, follow up in 1 week, hopefully at this point he will be able to tolerate compression stockings which is the long term goal.

## 2015-08-13 ENCOUNTER — Ambulatory Visit (INDEPENDENT_AMBULATORY_CARE_PROVIDER_SITE_OTHER): Payer: Self-pay | Admitting: Pharmacist

## 2015-08-13 DIAGNOSIS — I82401 Acute embolism and thrombosis of unspecified deep veins of right lower extremity: Secondary | ICD-10-CM

## 2015-08-13 DIAGNOSIS — Z7901 Long term (current) use of anticoagulants: Secondary | ICD-10-CM

## 2015-08-13 LAB — POCT INR: INR: 1.7

## 2015-08-13 NOTE — Progress Notes (Signed)
Reviewed Thanks DrG 

## 2015-08-13 NOTE — Progress Notes (Signed)
Patient ID: [name] [name], male   DOB: 04/17/1966, 50 y.o.   MRN: [id number] Anticoagulation Management Stella A Wierzba is a 50 y.o. male who reports to the clinic for monitoring of warfarin treatment.    Indication: DVT and PE Duration: indefinite  Anticoagulation Clinic Visit History: Patient does not report signs/symptoms of bleeding or thromboembolism other than the swollen leg presented with on Tuesday Other recent changes: Pt will d/c use of Keflex on  07/15/15 Anticoagulation Episode Summary    Current INR goal 2.0-3.0  Next INR check 08/17/2015  INR from last check 1.7! (08/13/2015)  Weekly max dose   Target end date   INR check location   Preferred lab   Send INR reminders to [facility]   Indications  Long-term (current) use of anticoagulants [Z79.01] DVT (Resolved) [I82.409] Right leg DVT ([place]) [I82.401]        Comments       Anticoagulation Care Providers    Provider Role Specialty Phone number   Bartholomew Crews, MD  Internal Medicine [phone]     ASSESSMENT Recent Results: Recent results are below, the most recent result is correlated with a dose of 82.5 mg per week: Lab Results  Component Value Date   INR 1.7 08/13/2015   INR 1.6 08/10/2015   INR 1.4 08/03/2015    Anticoagulation Dosing: INR as of 08/13/2015 and Previous Dosing Information    INR Dt INR Goal Wkly Tot Sun Mon Tue Wed Thu Fri Sat   08/13/2015 1.7 2.0-3.0 82.5 mg 10 mg 10 mg 12.5 mg 12.5 mg 12.5 mg 12.5 mg 12.5 mg   Patient deviated from recommended dosing.       Anticoagulation Dose Instructions as of 08/13/2015      Total Sun Mon Tue Wed Thu Fri Sat   New Dose 87.5 mg 12.5 mg 12.5 mg 12.5 mg 12.5 mg 12.5 mg 12.5 mg 12.5 mg     (5 mg x 2.5)  (5 mg x 2.5)  (5 mg x 2.5)  (5 mg x 2.5)  (5 mg x 2.5)  (5 mg x 2.5)  (5 mg x 2.5)                           INR today: Subtherapeutic  PLAN Weekly dose was increased by 6% to 87.5 mg per week  Patient Instructions  Patient educated  about medication as defined in this encounter and verbalized understanding by repeating back instructions provided.   The patient will continue taking lovenox injections until follow-up Patient advised to contact clinic or seek medical attention if signs/symptoms of bleeding or thromboembolism occur.  Follow-up Return in about 4 days (around 08/17/2015).  30 minutes spent face-to-face with the patient during the encounter. 25% of time spent on education. 75% of time was spent on assessment and plan. Medication Samples have been provided to the patient.  Drug name: Lovenox 150mg /ml  Qty: 4  LOT: [id number]  Exp.Date: 07/2016  The patient has been instructed regarding the correct time, dose, and frequency of taking this medication, including desired effects and most common side effects.  Lavella Lemons Coreyon Nicotra 9:26 AM 08/13/2015

## 2015-08-13 NOTE — Patient Instructions (Signed)
Patient educated about medication as defined in this encounter and verbalized understanding by repeating back instructions provided.   

## 2015-08-13 NOTE — Progress Notes (Signed)
Patient was seen in clinic with Tanya Makhlouf, PharmD candidate. I agree with the assessment and plan of care documented by Tanya. 

## 2015-08-15 NOTE — Progress Notes (Signed)
Internal Medicine Clinic Attending  Case discussed with Dr. Hoffman soon after the resident saw the patient.  We reviewed the resident's history and exam and pertinent patient test results.  I agree with the assessment, diagnosis, and plan of care documented in the resident's note. 

## 2015-08-17 ENCOUNTER — Encounter: Payer: Self-pay | Admitting: Internal Medicine

## 2015-08-17 ENCOUNTER — Ambulatory Visit (INDEPENDENT_AMBULATORY_CARE_PROVIDER_SITE_OTHER): Payer: Self-pay | Admitting: Internal Medicine

## 2015-08-17 VITALS — BP 130/85 | HR 64 | Temp 97.6°F | Resp 18 | Ht 76.0 in | Wt 253.3 lb

## 2015-08-17 DIAGNOSIS — I872 Venous insufficiency (chronic) (peripheral): Secondary | ICD-10-CM

## 2015-08-17 DIAGNOSIS — Z7901 Long term (current) use of anticoagulants: Secondary | ICD-10-CM

## 2015-08-17 DIAGNOSIS — Z86718 Personal history of other venous thrombosis and embolism: Secondary | ICD-10-CM

## 2015-08-17 NOTE — Progress Notes (Addendum)
[facility] Subjective:   Patient ID: [name] [name] male   DOB: 06-06-1966 50 y.o.   MRN: [id number]  HPI: [name] is a 50 y.o. male with a PMH detailed below who presents for 1 week follow up of RLE swelling and pain.  Please see problem based charting below for the status of his chronic medical problems.    Past Medical History  Diagnosis Date  . DVT (deep venous thrombosis) (HCC)     on Coumadin Rx  . Substance abuse     crack, cocaine, last use 2007  . Tobacco abuse   . DVT (deep venous thrombosis) ([place])   . Bronchitis     last time 2 yrs ago  . Heart murmur   . Shortness of breath   . Tuberculosis     ' I test Positive "  . Asthma     uses Albuterol daily as needed  . Cough     smokers  . Arthritis     knees  . Joint pain   . GERD (gastroesophageal reflux disease)     only takes something about 2 times a yr  . Cellulitis   . Bipolar 1 disorder ([place])   . Alcohol abuse   . Pulmonary embolism ([place]) 2014  . Chronic dermatitis     due to Xarelto Rx  . Depression   . GERD (gastroesophageal reflux disease)    Current Outpatient Prescriptions  Medication Sig Dispense Refill  . albuterol (PROVENTIL HFA;VENTOLIN HFA) 108 (90 BASE) MCG/ACT inhaler Inhale 2 puffs into the lungs every 4 (four) hours as needed for wheezing or shortness of breath. 1 Inhaler 3  . augmented betamethasone dipropionate (DIPROLENE-AF) 0.05 % ointment Apply topically 2 (two) times daily. 340B patient, NDC [phone] 50 g 1  . cephALEXin (KEFLEX) 500 MG capsule Take 500 mg by mouth 4 (four) times daily - after meals and at bedtime.    . DULoxetine (CYMBALTA) 60 MG capsule Take 1 capsule (60 mg total) by mouth daily. 90 capsule 3  . Fluticasone-Salmeterol (ADVAIR DISKUS) 500-50 MCG/DOSE AEPB Inhale 1 puff into the lungs 2 (two) times daily. 1 each 6  . furosemide (LASIX) 40 MG tablet Take 1 tablet (40 mg total) by mouth daily. 30 tablet 0  . gabapentin (NEURONTIN)  300 MG capsule Take 2 capsules (600 mg total) by mouth 3 (three) times daily. 180 capsule 3  . hydrocerin (EUCERIN) CREA Apply 1 application topically daily. 454 g 0  . hydrOXYzine (ATARAX/VISTARIL) 25 MG tablet Take 1 tablet (25 mg total) by mouth 3 (three) times daily as needed for itching. 30 tablet 6  . lidocaine (XYLOCAINE) 5 % ointment Apply 1 application topically daily. (Patient not taking: Reported on 07/30/2015) 35.44 g 2  . nicotine (NICODERM CQ - DOSED IN MG/24 HOURS) 14 mg/24hr patch Place 1 patch (14 mg total) onto the skin daily. 42 patch 0  . traZODone (DESYREL) 100 MG tablet Take 1 tablet (100 mg total) by mouth at bedtime as needed for sleep. 14 tablet 5  . warfarin (COUMADIN) 5 MG tablet UPDATED DOSE: Take 2 tablets (10 mg) by mouth daily, except 2.5 tablets (12.5 mg) on Thursdays and Saturdays 65 tablet 5   No current facility-administered medications for this visit.   Family History  Problem Relation Age of Onset  . Asthma Mother   . Throat cancer Father    Social History   Social History  . Marital Status: Single  Spouse Name: N/A  . Number of Children: N/A  . Years of Education: N/A   Occupational History  . Details Cars    Social History Main Topics  . Smoking status: Light Tobacco Smoker -- 0.25 packs/day for 25 years    Types: Cigarettes  . Smokeless tobacco: Former Systems developer     Comment: "stopped chewing in 1990s" 2 cigs per day. Quitting  . Alcohol Use: No     Comment: last alcohol intake 2 months ago per pt  . Drug Use: No     Comment: hx of-last time was 2 months ago per pt  . Sexual Activity: Not Asked   Other Topics Concern  . None   Social History Narrative   Working at [employer] now unemployed.  States he has insurance through them now.    Lives with mom and step dad             Review of Systems: Review of Systems  Constitutional: Negative for fever and chills.  Cardiovascular: Positive for leg swelling (improved). Negative  for claudication.  Musculoskeletal: Negative for falls.     Objective:  Physical Exam: Filed Vitals:   08/17/15 0855  BP: 130/85  Pulse: 64  Temp: 97.6 F (36.4 C)  TempSrc: Oral  Resp: 18  Height: 6\' 4"  (1.93 m)  Weight: 253 lb 4.8 oz (114.896 kg)  SpO2: 99%  Physical Exam  Constitutional: He is well-developed, well-nourished, and in no distress.  Musculoskeletal: He exhibits edema (1+ RLE).  Skin:  Chronic venous stasis changes of right lower extremity, no redness or warmth, small areas superficial skin breakdown in multiple areas  Nursing note and vitals reviewed.   Assessment & Plan:  Case discussed with Dr. Daryll Drown  Chronic venous insufficiency HPI: His swelling and pain continue to improve with the [facility] boot in place.  He reports today that the pain in his right lower extremtiy is a 6/10, he feels with one more week of the [facility] boot he will be able to reapply compression stockings.  A: Chronic venous insufficiency s/p DVT of RLE.  P: Will reapply UNNA boot today, in one week he may remove, if able to apply compression stockings he can just keep regular follow up with PCP otherwise he will need to be reevaluated in 1 week.  Long-term (current) use of anticoagulants HPI: He has been complaint with Coumadin  A: Long term use of A/C  P: Check INR. -Adjustments per pharm    Medications Ordered No orders of the defined types were placed in this encounter.   Other Orders Orders Placed This Encounter  Procedures  . Apply unna boot    Standing Status: Standing     Number of Occurrences: 1     Standing Expiration Date:     Order Specific Question:  Laterality    Answer:  Right  . POCT INR   Follow Up: Return in about 1 week (around 08/24/2015), or if symptoms worsen or fail to improve.

## 2015-08-17 NOTE — Assessment & Plan Note (Addendum)
HPI: He has been complaint with Coumadin  A: Long term use of A/C  P: Check INR. -Adjustments per pharm

## 2015-08-17 NOTE — Progress Notes (Signed)
Patient ID: [name] [name], male   DOB: Jul 12, 1965, 50 y.o.   MRN: [id number] Anticoagulation Management [name] [name] is a 50 y.o. male who reports to the clinic for monitoring of warfarin treatment.    Indication: DVT and PE Duration: indefinite  Anticoagulation Clinic Visit History: Patient does not report signs/symptoms of bleeding or thromboembolism  Anticoagulation Episode Summary    Current INR goal 2.0-3.0  Next INR check 08/17/2015  INR from last check 1.7! (08/13/2015)  Weekly max dose   Target end date   INR check location   Preferred lab   Send INR reminders to [facility]AG [facility]   Indications  Long-term (current) use of anticoagulants [Z79.01] DVT (Resolved) [I82.409] Right leg DVT ([place]) [I82.401]        Comments       Anticoagulation Care Providers    Provider Role Specialty Phone number   Bartholomew Crews, MD  Internal Medicine [phone]     ASSESSMENT Recent Results: The most recent result is correlated with 87.5 mg per week: Lab Results  Component Value Date   INR 1.6 08/17/2015   INR 1.7 08/13/2015   INR 1.6 08/10/2015   INR 1.4 08/03/2015    Anticoagulation Dosing: INR as of 08/17/2015 and Previous Dosing Information    INR Dt INR Goal Madilyn Fireman Sun Mon Tue Wed Thu Fri Sat   08/17/2015 1.6 2.0-3.0 87.5 mg 12.5 mg 12.5 mg 12.5 mg 12.5 mg 12.5 mg 12.5 mg 12.5 mg   Patient deviated from recommended dosing.       Anticoagulation Dose Instructions as of 08/13/2015      Total Sun Mon Tue Wed Thu Fri Sat   New Dose 97.5 mg 15 mg 12.5 mg 15 mg 12.5 mg 15 mg 12.5 mg 15 mg     (5 mg x 3)  (5 mg x 2.5)  (5 mg x 3)  (5 mg x 2.5)  (5 mg x 3)  (5 mg x 2.5)  (5 mg x 3)                           INR today: subtherapeutic  PLAN Weekly dose was increased by 11% to 97.5 mg per week  Patient Instructions   General Instructions:  Please bring your medicines with you each time you come to clinic.  Medicines may include prescription medications,  over-the-counter medications, herbal remedies, eye drops, vitamins, or other pills.   Progress Toward Treatment Goals:  Treatment Goal 04/07/2013  Stop smoking smoking less    Self Care Goals & Plans:  Self Care Goal 08/03/2015  Manage my medications take my medicines as prescribed; bring my medications to every visit; refill my medications on time  Monitor my health -  Eat healthy foods eat more vegetables; eat foods that are low in salt; eat baked foods instead of fried foods  Be physically active find an activity I enjoy  Stop smoking -    No flowsheet data found.   Care Management & Community Referrals:  Referral 01/02/2013  Referrals made for care management support none needed         Patient advised to contact clinic or seek medical attention if signs/symptoms of bleeding or thromboembolism occur. The patient will continue lovenox injections until f/u.  Follow-up Return in about 1 week (around 08/24/2015), or if symptoms worsen or fail to improve.  30 minutes spent face-to-face with the patient during the encounter. 50% of time  spent on education. 50% of time was spent on assessment and plan.  Medication Samples have been provided to the patient.  Drug name: Lovenox 150mg /61ml   Qty: 7  LOT: [id number]  Exp.Date: 07/2016  The patient has been instructed regarding the correct time, dose, and frequency of taking this medication, including desired effects and most common side effects.   Tanya Makhlouf 11:02 AM 08/17/2015

## 2015-08-17 NOTE — Patient Instructions (Signed)
General Instructions:  Please bring your medicines with you each time you come to clinic.  Medicines may include prescription medications, over-the-counter medications, herbal remedies, eye drops, vitamins, or other pills.   Progress Toward Treatment Goals:  Treatment Goal 04/07/2013  Stop smoking smoking less    Self Care Goals & Plans:  Self Care Goal 08/03/2015  Manage my medications take my medicines as prescribed; bring my medications to every visit; refill my medications on time  Monitor my health -  Eat healthy foods eat more vegetables; eat foods that are low in salt; eat baked foods instead of fried foods  Be physically active find an activity I enjoy  Stop smoking -    No flowsheet data found.   Care Management & Community Referrals:  Referral 01/02/2013  Referrals made for care management support none needed

## 2015-08-17 NOTE — Assessment & Plan Note (Signed)
HPI: His swelling and pain continue to improve with the [facility] boot in place.  He reports today that the pain in his right lower extremtiy is a 6/10, he feels with one more week of the [facility] boot he will be able to reapply compression stockings.  A: Chronic venous insufficiency s/p DVT of RLE.  P: Will reapply UNNA boot today, in one week he may remove, if able to apply compression stockings he can just keep regular follow up with PCP otherwise he will need to be reevaluated in 1 week.

## 2015-08-23 NOTE — Progress Notes (Signed)
Internal Medicine Clinic Attending  Case discussed with Dr. Hoffman at the time of the visit.  We reviewed the resident's history and exam and pertinent patient test results.  I agree with the assessment, diagnosis, and plan of care documented in the resident's note.  

## 2015-08-24 ENCOUNTER — Ambulatory Visit (INDEPENDENT_AMBULATORY_CARE_PROVIDER_SITE_OTHER): Payer: Self-pay | Admitting: Internal Medicine

## 2015-08-24 ENCOUNTER — Encounter: Payer: Self-pay | Admitting: Internal Medicine

## 2015-08-24 VITALS — BP 119/84 | HR 89 | Temp 98.2°F | Wt 256.3 lb

## 2015-08-24 DIAGNOSIS — I872 Venous insufficiency (chronic) (peripheral): Secondary | ICD-10-CM

## 2015-08-24 NOTE — Progress Notes (Signed)
Patient ID: [name] [name], male   DOB: [birth year], 50 y.o.   MRN: [id number] Anticoagulation Management [name] [name] is a 50 y.o. male who reports to the clinic for monitoring of warfarin treatment.    Indication: DVT Duration: indefinite  Anticoagulation Clinic Visit History: Patient does not report signs/symptoms of bleeding or thromboembolism  Anticoagulation Episode Summary    Current INR goal 2.0-3.0  Next INR check 08/27/2015  INR from last check 1.5! (08/24/2015)  Weekly max dose   Target end date   INR check location   Preferred lab   Send INR reminders to ANTICOAG [facility]   Indications  Long-term (current) use of anticoagulants [Z79.01] DVT (Resolved) [I82.409] Right leg DVT ([place]) [I82.401]        Comments       Anticoagulation Care Providers    Provider Role Specialty Phone number   Bartholomew Crews, MD  Internal Medicine [phone]     ASSESSMENT Recent Results: The most recent result is correlated with 32.5 mg per week: Lab Results  Component Value Date   INR 1.5 08/24/2015   INR 1.7 08/13/2015   INR 1.6 08/10/2015   INR 1.4 08/03/2015    Anticoagulation Dosing: INR as of 08/24/2015 and Previous Dosing Information    INR Dt INR Goal Madilyn Fireman Sun Mon Tue Wed Thu Fri Sat   08/13/2015 1.5 2.0-3.0 97.5 mg 15 mg 12.5 mg 15 mg 12.5 mg 15 mg 12.5 mg 15 mg   Patient deviated from recommended dosing.       Anticoagulation Dose Instructions as of 08/13/2015      Total Sun Mon Tue Wed Thu Fri Sat   New Dose 97.5 mg 15 mg 12.5 mg 15 mg 12.5 mg 15 mg 12.5 mg 15 mg     (5 mg x 3)  (5 mg x 2.5)  (5 mg x 3)  (5 mg x 2.5)  (5 mg x 3)  (5 mg x 2.5)  (5 mg x 3)                           INR today: Subtherapeutic  PLAN Weekly dose was unchanged by 0% to 97.5 mg per week The patient was not taking warfarin correctly. Will keep same regimen until Friday and reassess. Pt was educated on correct dosing regimen and was able to repeat back correctly. Pt will continue  lovenox shots until f/u. Patient advised to contact clinic or seek medical attention if signs/symptoms of bleeding or thromboembolism occur.  Follow-up 08/27/2015  30 minutes spent face-to-face with the patient during the encounter. 50% of time spent on education. 50% of time was spent on assessment and plan. Medication Samples have been provided to the patient.  Drug name: Lovenox 13m/ml  Qty: 3  LOT: [id number]  Exp.Date: 07/2016  The patient has been instructed regarding the correct time, dose, and frequency of taking this medication, including desired effects and most common side effects.   [name] [name] [name] [name] 11:15 AM 08/24/2015

## 2015-08-27 ENCOUNTER — Telehealth: Payer: Self-pay | Admitting: Internal Medicine

## 2015-08-27 NOTE — Telephone Encounter (Signed)
APPT. REMINDER CALL, LMTCB IF HE NEEDS TO CANCEL °

## 2015-08-28 NOTE — Progress Notes (Signed)
[place] [facility] Subjective:   Patient ID: [name] male   DOB: [birth year] 50 y.o.   MRN: [id number]  HPI: [name] is a 50 y.o. male with a PMH detailed below who returns today for follow up of his chronic venous insufficency and pain in his right leg. He reprots the unna boots have helped greatly, he feels the best he has felt in some time and believes that he can tollerate the compression stocking now.    Past Medical History  Diagnosis Date  . DVT (deep venous thrombosis) (HCC)     on Coumadin Rx  . Substance abuse     crack, cocaine, last use 2007  . Tobacco abuse   . DVT (deep venous thrombosis) ([place])   . Bronchitis     last time 2 yrs ago  . Heart murmur   . Shortness of breath   . Tuberculosis     ' I test Positive "  . Asthma     uses Albuterol daily as needed  . Cough     smokers  . Arthritis     knees  . Joint pain   . GERD (gastroesophageal reflux disease)     only takes something about 2 times a yr  . Cellulitis   . Bipolar 1 disorder ([name])   . Alcohol abuse   . Pulmonary embolism ([name]) 2014  . Chronic dermatitis     due to Xarelto Rx  . Depression   . GERD (gastroesophageal reflux disease)    Current Outpatient Prescriptions  Medication Sig Dispense Refill  . albuterol (PROVENTIL HFA;VENTOLIN HFA) 108 (90 BASE) MCG/ACT inhaler Inhale 2 puffs into the lungs every 4 (four) hours as needed for wheezing or shortness of breath. 1 Inhaler 3  . augmented betamethasone dipropionate (DIPROLENE-AF) 0.05 % ointment Apply topically 2 (two) times daily. 340B patient, NDC [phone] 50 g 1  . cephALEXin (KEFLEX) 500 MG capsule Take 500 mg by mouth 4 (four) times daily - after meals and at bedtime.    . DULoxetine (CYMBALTA) 60 MG capsule Take 1 capsule (60 mg total) by mouth daily. 90 capsule 3  . Fluticasone-Salmeterol (ADVAIR DISKUS) 500-50 MCG/DOSE AEPB Inhale 1 puff into the lungs 2 (two) times daily. 1 each 6  . furosemide  (LASIX) 40 MG tablet Take 1 tablet (40 mg total) by mouth daily. 30 tablet 0  . gabapentin (NEURONTIN) 300 MG capsule Take 2 capsules (600 mg total) by mouth 3 (three) times daily. 180 capsule 3  . hydrocerin (EUCERIN) CREA Apply 1 application topically daily. 454 g 0  . hydrOXYzine (ATARAX/VISTARIL) 25 MG tablet Take 1 tablet (25 mg total) by mouth 3 (three) times daily as needed for itching. 30 tablet 6  . lidocaine (XYLOCAINE) 5 % ointment Apply 1 application topically daily. (Patient not taking: Reported on 07/30/2015) 35.44 g 2  . nicotine (NICODERM CQ - DOSED IN MG/24 HOURS) 14 mg/24hr patch Place 1 patch (14 mg total) onto the skin daily. 42 patch 0  . traZODone (DESYREL) 100 MG tablet Take 1 tablet (100 mg total) by mouth at bedtime as needed for sleep. 14 tablet 5  . warfarin (COUMADIN) 5 MG tablet UPDATED DOSE: Take 2 tablets (10 mg) by mouth daily, except 2.5 tablets (12.5 mg) on Thursdays and Saturdays 65 tablet 5   No current facility-administered medications for this visit.   Family History  Problem Relation Age of Onset  . Asthma Mother   . Throat  cancer Father    Social History   Social History  . Marital Status: Single    Spouse Name: N/A  . Number of Children: N/A  . Years of Education: N/A   Occupational History  . Details Cars    Social History Main Topics  . Smoking status: Light Tobacco Smoker -- 0.25 packs/day for 25 years    Types: Cigarettes  . Smokeless tobacco: Former Systems developer     Comment: "stopped chewing in 1990s" 2 cigs per day. Quitting  . Alcohol Use: No     Comment: last alcohol intake 2 months ago per pt  . Drug Use: No     Comment: hx of-last time was 2 months ago per pt  . Sexual Activity: Not Asked   Other Topics Concern  . None   Social History Narrative   Working at [employer] now unemployed.  States he has insurance through them now.    Lives with mom and step dad             Review of Systems: Review of Systems   Constitutional: Negative for fever.     Objective:  Physical Exam: Filed Vitals:   08/24/15 0947  BP: 119/84  Pulse: 89  Temp: 98.2 F (36.8 C)  TempSrc: Oral  Weight: 256 lb 4.8 oz (116.257 kg)  SpO2: 99%  Physical Exam  Musculoskeletal:  The edema of his right lower extremity is greatly improved only minimal today, he is no longer tender at his right anterior ankle  Skin:  Chronic venous dermatitis changes to bilateral lower extremities without breakdown of skin    Assessment & Plan:  Case discussed with Dr. Daryll Drown  Chronic venous insufficiency -Greatly improved by Louretta Parma boots. - Will have patient return to using compression stockings.    Medications Ordered No orders of the defined types were placed in this encounter.   Other Orders No orders of the defined types were placed in this encounter.   Follow Up: Return if symptoms worsen or fail to improve.

## 2015-08-29 NOTE — Assessment & Plan Note (Signed)
-  Greatly improved by Unna boots. - Will have patient return to using compression stockings.

## 2015-08-30 ENCOUNTER — Encounter: Payer: Self-pay | Admitting: Internal Medicine

## 2015-08-30 NOTE — Progress Notes (Signed)
Internal Medicine Clinic Attending  Case discussed with Dr. Hoffman at the time of the visit.  We reviewed the resident's history and exam and pertinent patient test results.  I agree with the assessment, diagnosis, and plan of care documented in the resident's note.  

## 2015-09-07 ENCOUNTER — Ambulatory Visit: Payer: Self-pay

## 2015-09-08 ENCOUNTER — Ambulatory Visit: Payer: Self-pay | Admitting: Internal Medicine

## 2015-09-08 ENCOUNTER — Ambulatory Visit: Payer: Self-pay | Admitting: Pharmacist

## 2015-09-13 ENCOUNTER — Telehealth: Payer: Self-pay | Admitting: Internal Medicine

## 2015-09-13 NOTE — Telephone Encounter (Signed)
APPT. REMINDER CALL, LMTCB IF HE NEEDS TO CANCEL °

## 2015-09-14 ENCOUNTER — Ambulatory Visit (INDEPENDENT_AMBULATORY_CARE_PROVIDER_SITE_OTHER): Payer: Self-pay | Admitting: Internal Medicine

## 2015-09-14 ENCOUNTER — Ambulatory Visit: Payer: Self-pay | Admitting: Pharmacist

## 2015-09-14 ENCOUNTER — Encounter: Payer: Self-pay | Admitting: Internal Medicine

## 2015-09-14 VITALS — BP 121/93 | HR 80 | Temp 98.0°F | Resp 18 | Ht 76.0 in | Wt 255.5 lb

## 2015-09-14 DIAGNOSIS — M25552 Pain in left hip: Secondary | ICD-10-CM | POA: Insufficient documentation

## 2015-09-14 DIAGNOSIS — I872 Venous insufficiency (chronic) (peripheral): Secondary | ICD-10-CM

## 2015-09-14 DIAGNOSIS — Z7901 Long term (current) use of anticoagulants: Secondary | ICD-10-CM

## 2015-09-14 LAB — POCT INR: INR: 1.5

## 2015-09-14 NOTE — Progress Notes (Signed)
   Subjective:    Patient ID: [name] [name], male    DOB: 09-17-1965, 50 y.o.   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 Sharol Given in the past and compression therapy was deemed the only good option for him. He has been using the [place] boot with great improvement. Currently swelling is better. No open skin lesions. No drainage or pain. Switched to regular compression stocking now from [employer].   Also has some left lateral hip pain that radiates down to the left foot only on the lateral aspect for 1 week. No injuries. No back pain. No weakness/numbness.   Denies any other complaint. Here to also get his INR checked today.   Review of Systems  Constitutional: Negative for fever, chills and fatigue.  HENT: Negative for congestion and sore throat.   Respiratory: Negative for chest tightness and shortness of breath.   Cardiovascular: Negative for chest pain and palpitations.  Gastrointestinal: Negative for abdominal pain and abdominal distention.  Endocrine: Negative.   Genitourinary: Negative for dysuria and hematuria.  Musculoskeletal: Positive for arthralgias. Negative for back pain, joint swelling and gait problem.  Skin: Negative.   Neurological: Negative.   Hematological: Negative.   Psychiatric/Behavioral: Negative.        Objective:   Physical Exam  Constitutional: He is oriented to person, place, and time. He appears well-developed and well-nourished. No distress.  HENT:  Head: Normocephalic and atraumatic.  Eyes: Conjunctivae are normal. Pupils are equal, round, and reactive to light.  Neck: Normal range of motion.  Cardiovascular: Normal rate, regular rhythm and normal heart sounds.  Exam reveals no gallop and no friction rub.   No murmur  heard. Pulmonary/Chest: Effort normal and breath sounds normal. No respiratory distress.  Abdominal: Soft. Bowel sounds are normal. He exhibits no distension. There is no tenderness.  Musculoskeletal: Normal range of motion.  Has chronic venous stasis skin changes on right leg. Currently has very trace edema on this leg. No open skin lesions, has healing old skin lesions. No drainage, warmth, or erythema.   Normal left lower ext. Good ROM. No tenderness.  Neurological: He is alert and oriented to person, place, and time.  Skin: Skin is warm. He is not diaphoretic.     Filed Vitals:   09/14/15 0901  BP: 121/93  Pulse: 80  Temp: 98 F (36.7 C)  Resp: 18        Assessment & Plan:  See problem based a&p.

## 2015-09-14 NOTE — Progress Notes (Signed)
Anticoagulation Management [name] [name] is a 50 y.o. male who reports to the clinic for monitoring of warfarin treatment.    Indication: DVT and PE Duration: indefinite  Anticoagulation Clinic Visit History: Patient does not report signs/symptoms of bleeding or thromboembolism  Anticoagulation Episode Summary    Current INR goal 2.0-3.0  Next INR check 09/17/2015  INR from last check 1.5! (09/14/2015)  Weekly max dose   Target end date   INR check location   Preferred lab   Send INR reminders to [facility]   Indications  Long-term (current) use of anticoagulants [Z79.01] DVT (Resolved) [I82.409] Right leg DVT ([place]) [I82.401]        Comments       Anticoagulation Care Providers    Provider Role Specialty Phone number   Bartholomew Crews, MD  Internal Medicine [phone]     ASSESSMENT Recent Results: The most recent result is correlated with 97.5 mg per week: Lab Results  Component Value Date   INR 1.5 09/14/2015   INR 1.7 08/13/2015   INR 1.6 08/10/2015    Anticoagulation Dosing: INR as of 09/14/2015 and Previous Dosing Information    INR Dt INR Goal [name] Sun Mon Tue Wed Thu Fri Sat   09/14/2015 1.5 2.0-3.0 87.5 mg 12.5 mg 12.5 mg 12.5 mg 12.5 mg 12.5 mg 12.5 mg 12.5 mg    Anticoagulation Dose Instructions as of 09/14/2015      Total Sun Mon Tue Wed Thu Fri Sat   New Dose 105 mg 15 mg 15 mg 15 mg 15 mg 15 mg 15 mg 15 mg     (5 mg x 3)  (5 mg x 3)  (5 mg x 3)  (5 mg x 3)  (5 mg x 3)  (5 mg x 3)  (5 mg x 3)                           INR today: Subtherapeutic  PLAN Weekly dose was increased by 8% to 105 mg per week  Patient Instructions  DoseResponse handout provided  Pt admits to missing one dose late last week. Likely has higher warfarin requirement in the setting of a single missed dose and no other interacting factors. Pt will bridge with lovenox per previous instructions and will return to clinic 09/17/2015.   Patient advised to contact  clinic or seek medical attention if signs/symptoms of bleeding or thromboembolism occur.  Patient verbalized understanding by repeating back information and was advised to contact me if further medication-related questions arise. Patient was also provided an information handout.  Follow-up Return in about 3 days (around 09/17/2015) for INR check.  Judieth Keens, PharmD Clinical Pharmacy Resident  15 minutes spent face-to-face with the patient during the encounter. 30% of time spent on education. 70% of time was spent on INR testing.

## 2015-09-14 NOTE — Assessment & Plan Note (Signed)
Doing much better after using the [place] boot. Only has trace swelling on right leg. No signs of infection or open skin lesions.  Switched to regular compression stocking now.  Will recheck in 1 month.

## 2015-09-14 NOTE — Patient Instructions (Signed)
You are doing great. Keep doing the compression stocking. F/up in 1 month for check up.  Take ibuprofen 600mg  (3 of 200mg  tablet) every 4 hrs as needed along with ice /heat for your left hip pain.

## 2015-09-14 NOTE — Patient Instructions (Signed)
DoseResponse handout provided

## 2015-09-14 NOTE — Progress Notes (Signed)
INTERNAL MEDICINE TEACHING ATTENDING ADDENDUM - Lalla Brothers M.D  Duration- indefinite, Indication- DVT, INR- subtherapeutic. Agree with pharmacy recommendations as outlined in their note.

## 2015-09-15 NOTE — Progress Notes (Signed)
Internal Medicine Clinic Attending  Case discussed with Dr. Ahmed at the time of the visit.  We reviewed the resident's history and exam and pertinent patient test results.  I agree with the assessment, diagnosis, and plan of care documented in the resident's note. 

## 2015-09-17 ENCOUNTER — Other Ambulatory Visit: Payer: Self-pay | Admitting: *Deleted

## 2015-09-17 ENCOUNTER — Ambulatory Visit (INDEPENDENT_AMBULATORY_CARE_PROVIDER_SITE_OTHER): Payer: Self-pay | Admitting: Pharmacist

## 2015-09-17 DIAGNOSIS — G4700 Insomnia, unspecified: Secondary | ICD-10-CM

## 2015-09-17 DIAGNOSIS — Z7901 Long term (current) use of anticoagulants: Secondary | ICD-10-CM

## 2015-09-17 DIAGNOSIS — I825Y1 Chronic embolism and thrombosis of unspecified deep veins of right proximal lower extremity: Secondary | ICD-10-CM

## 2015-09-17 LAB — POCT INR: INR: 2.2

## 2015-09-17 MED ORDER — TRAZODONE HCL 100 MG PO TABS: 100.0000 mg | ORAL_TABLET | Freq: Every evening | ORAL | Status: DC | PRN

## 2015-09-17 NOTE — Telephone Encounter (Signed)
Called to pharm 

## 2015-09-17 NOTE — Progress Notes (Signed)
Anticoagulation Management [name] [name] is a 50 y.o. male who reports to the clinic for monitoring of warfarin treatment.    Indication: DVT Duration: indefinite  Anticoagulation Clinic Visit History: Patient does not report signs/symptoms of bleeding or thromboembolism  Anticoagulation Episode Summary    Current INR goal 2.0-3.0  Next INR check 09/24/2015  INR from last check 2.2 (09/17/2015)  Weekly max dose   Target end date   INR check location   Preferred lab   Send INR reminders to [facility]   Indications  Long-term (current) use of anticoagulants [Z79.01] DVT (Resolved) [I82.409] Right leg DVT ([place]) [I82.401]        Comments       Anticoagulation Care Providers    Provider Role Specialty Phone number   Bartholomew Crews, MD  Internal Medicine [phone]     ASSESSMENT Recent Results: The most recent result is correlated with 105 mg per week: Lab Results  Component Value Date   INR 2.2 09/17/2015   INR 1.5 09/14/2015   INR 1.7 08/13/2015   Anticoagulation Dosing: INR as of 09/17/2015 and Previous Dosing Information    INR Dt INR Goal [name] Sun Mon Tue Wed Thu Fri Sat   09/17/2015 2.2 2.0-3.0 105 mg 15 mg 15 mg 15 mg 15 mg 15 mg 15 mg 15 mg    Anticoagulation Dose Instructions as of 09/17/2015      Total Sun Mon Tue Wed Thu Fri Sat   New Dose 105 mg 15 mg 15 mg 15 mg 15 mg 15 mg 15 mg 15 mg     (5 mg x 3)  (5 mg x 3)  (5 mg x 3)  (5 mg x 3)  (5 mg x 3)  (5 mg x 3)  (5 mg x 3)                           INR today: Therapeutic  PLAN Weekly dose was unchanged   Trazodone Rx from 08/05/15 was not received by [facility] pharmacy so re-sent Rx today.   Patient Instructions  Patient educated about medication as defined in this encounter and verbalized understanding by repeating back instructions provided.   Patient advised to contact clinic or seek medical attention if signs/symptoms of bleeding or thromboembolism occur.  Patient verbalized  understanding by repeating back information and was advised to contact me if further medication-related questions arise. Patient was also provided an information handout.  Follow-up No Follow-up on file.  [name] [name] J  15 minutes spent face-to-face with the patient during the encounter. 50% of time spent on education. 50% of time was spent on assessment and plan.

## 2015-09-17 NOTE — Progress Notes (Signed)
INTERNAL MEDICINE TEACHING ATTENDING ADDENDUM - Duncan Vincent M.D  Duration- indefinite, Indication- DVT, INR- therapeutic. Agree with pharmacy recommendations as outlined in their note.   

## 2015-09-17 NOTE — Patient Instructions (Signed)
Patient educated about medication as defined in this encounter and verbalized understanding by repeating back instructions provided.   

## 2015-09-24 ENCOUNTER — Other Ambulatory Visit: Payer: Self-pay | Admitting: Pharmacist

## 2015-09-24 ENCOUNTER — Ambulatory Visit (INDEPENDENT_AMBULATORY_CARE_PROVIDER_SITE_OTHER): Payer: Self-pay | Admitting: Pharmacist

## 2015-09-24 DIAGNOSIS — I82401 Acute embolism and thrombosis of unspecified deep veins of right lower extremity: Secondary | ICD-10-CM

## 2015-09-24 DIAGNOSIS — I87091 Postthrombotic syndrome with other complications of right lower extremity: Secondary | ICD-10-CM

## 2015-09-24 DIAGNOSIS — Z7901 Long term (current) use of anticoagulants: Secondary | ICD-10-CM

## 2015-09-24 DIAGNOSIS — I2699 Other pulmonary embolism without acute cor pulmonale: Secondary | ICD-10-CM

## 2015-09-24 LAB — POCT INR: INR: 1.9

## 2015-09-24 MED ORDER — WARFARIN SODIUM 5 MG PO TABS: ORAL_TABLET | ORAL | Status: DC

## 2015-09-24 NOTE — Progress Notes (Signed)
Anticoagulation Management [name] [name] is a 50 y.o. male who reports to the clinic for monitoring of warfarin treatment.    Indication: DVT Duration: indefinite  Anticoagulation Clinic Visit History: Patient does not report signs/symptoms of bleeding or thromboembolism   Anticoagulation Episode Summary    Current INR goal 2.0-3.0  Next INR check 10/08/2015  INR from last check 1.9! (09/24/2015)  Weekly max dose   Target end date   INR check location   Preferred lab   Send INR reminders to [facility]   Indications  Long-term (current) use of anticoagulants [Z79.01] DVT (Resolved) [I82.409] Right leg DVT ([name]) [I82.401]        Comments       Anticoagulation Care Providers    Provider Role Specialty Phone number   Bartholomew Crews, MD  Internal Medicine [phone]     ASSESSMENT Recent Results: The most recent result is correlated with 105 mg per week:  Lab Results  Component Value Date   INR 1.9 09/24/2015   INR 2.2 09/17/2015   INR 1.5 09/14/2015    Anticoagulation Dosing: INR as of 09/24/2015 and Previous Dosing Information    INR Dt INR Goal [name] Sun Mon Tue Wed Thu Fri Sat   09/24/2015 1.9 2.0-3.0 105 mg 15 mg 15 mg 15 mg 15 mg 15 mg 15 mg 15 mg    Anticoagulation Dose Instructions as of 09/24/2015      Total Sun Mon Tue Wed Thu Fri Sat   New Dose 105 mg 15 mg 15 mg 15 mg 15 mg 15 mg 15 mg 15 mg     (5 mg x 3)  (5 mg x 3)  (5 mg x 3)  (5 mg x 3)  (5 mg x 3)  (5 mg x 3)  (5 mg x 3)                           INR today: Subtherapeutic  PLAN Weekly dose was unchanged. Pt barely subtherapeutic today with last INR therapeutic at 2.2. Will refrain from increasing at this time. If subtherapeutic at next visit, will adjust therapy.  Patient Instructions  DoseResponse handout given.  Patient advised to contact clinic or seek medical attention if signs/symptoms of bleeding or thromboembolism occur.  Patient verbalized understanding by repeating  back information and was advised to contact me if further medication-related questions arise. Patient was also provided an information handout.  Follow-up Return in 2 weeks (on 10/08/2015) for INR check.  Judieth Keens, PharmD. Clinical Pharmacy Resident  10 minutes spent face-to-face with the patient during the encounter.

## 2015-09-24 NOTE — Patient Instructions (Signed)
DoseResponse handout given.

## 2015-09-28 ENCOUNTER — Other Ambulatory Visit: Payer: Self-pay | Admitting: *Deleted

## 2015-09-28 DIAGNOSIS — J4540 Moderate persistent asthma, uncomplicated: Secondary | ICD-10-CM

## 2015-09-30 MED ORDER — ALBUTEROL SULFATE HFA 108 (90 BASE) MCG/ACT IN AERS: 2.0000 | INHALATION_SPRAY | RESPIRATORY_TRACT | Status: DC | PRN

## 2015-09-30 NOTE — Telephone Encounter (Signed)
Called to pharm 

## 2015-09-30 NOTE — Progress Notes (Signed)
I have reviewed Dr. Julianne Rice note.  Patient is on [facility] for VTE.  INR slightly low, no change.

## 2015-10-08 ENCOUNTER — Ambulatory Visit (INDEPENDENT_AMBULATORY_CARE_PROVIDER_SITE_OTHER): Payer: Self-pay | Admitting: Pharmacist

## 2015-10-08 DIAGNOSIS — Z7901 Long term (current) use of anticoagulants: Secondary | ICD-10-CM

## 2015-10-08 DIAGNOSIS — I82401 Acute embolism and thrombosis of unspecified deep veins of right lower extremity: Secondary | ICD-10-CM

## 2015-10-08 LAB — POCT INR: INR: 4.7

## 2015-10-08 NOTE — Progress Notes (Signed)
Anticoagulation Management [name] [name] is a 50 y.o. male who reports to the clinic for monitoring of warfarin treatment.    Indication: DVT and PE Duration: indefinite  Anticoagulation Clinic Visit History: Patient does not report signs/symptoms of bleeding or thromboembolism   Anticoagulation Episode Summary    Current INR goal 2.0-3.0  Next INR check 10/15/2015  INR from last check 4.7! (10/08/2015)  Weekly max dose   Target end date   INR check location   Preferred lab   Send INR reminders to [facility]   Indications  Long-term (current) use of anticoagulants [Z79.01] DVT (Resolved) [I82.409] Right leg DVT ([place]) [I82.401]        Comments       Anticoagulation Care Providers    Provider Role Specialty Phone number   Bartholomew Crews, MD  Internal Medicine [phone]     ASSESSMENT Recent Results:  The most recent result is correlated with 105 mg per week. Pt denies changes to medications, alcohol, diet, or extra doses. Last INR 1.9 on 105 mg/wk. Unsure of what is causing his INR to be so labile other than potential compliance issues which he denies. Reports no s/sx of bleeding.  Lab Results  Component Value Date   INR 4.7 10/08/2015   INR 1.9 09/24/2015   INR 2.2 09/17/2015    Anticoagulation Dosing: INR as of 10/08/2015 and Previous Dosing Information    INR Dt INR Goal [name] Sun Mon Tue Wed Thu Fri Sat   10/08/2015 4.7 2.0-3.0 105 mg 15 mg 15 mg 15 mg 15 mg 15 mg 15 mg 15 mg    Anticoagulation Dose Instructions as of 10/08/2015      Total Sun Mon Tue Wed Thu Fri Sat   New Dose 80 mg 15 mg 15 mg 10 mg 15 mg 10 mg Hold 15 mg     (5 mg x 3)  (5 mg x 3)  (5 mg x 2)  (5 mg x 3)  (5 mg x 2)  -  (5 mg x 3)                           INR today: Supratherapeutic  PLAN Weekly dose was decreased by approximately 10% to 95 mg per week. Will hold dose for today.   Patient Instructions  DoseResponse handout given  Patient advised to contact clinic  or seek medical attention if signs/symptoms of bleeding or thromboembolism occur.  Patient verbalized understanding by repeating back information and was advised to contact me if further medication-related questions arise. Patient was also provided an information handout.  Follow-up Return in 7 days (on 10/15/2015) for INR Check.  Judieth Keens, PharmD  15 minutes spent face-to-face with the patient during the encounter. 25% of time spent on education. 75% of time was spent on assessment and plan.

## 2015-10-08 NOTE — Progress Notes (Signed)
Patient was seen in clinic by Stephens November, PharmD, PGY1 pharmacy resident. I agree with the assessment and plan of care documented.

## 2015-10-08 NOTE — Patient Instructions (Signed)
DoseResponse handout given

## 2015-10-15 ENCOUNTER — Ambulatory Visit (INDEPENDENT_AMBULATORY_CARE_PROVIDER_SITE_OTHER): Payer: Self-pay | Admitting: Pharmacist

## 2015-10-15 DIAGNOSIS — Z9114 Patient's other noncompliance with medication regimen: Secondary | ICD-10-CM

## 2015-10-15 DIAGNOSIS — I87091 Postthrombotic syndrome with other complications of right lower extremity: Secondary | ICD-10-CM

## 2015-10-15 DIAGNOSIS — I2699 Other pulmonary embolism without acute cor pulmonale: Secondary | ICD-10-CM

## 2015-10-15 DIAGNOSIS — I82511 Chronic embolism and thrombosis of right femoral vein: Secondary | ICD-10-CM

## 2015-10-15 DIAGNOSIS — Z7901 Long term (current) use of anticoagulants: Secondary | ICD-10-CM

## 2015-10-15 LAB — POCT INR: INR: 2

## 2015-10-15 MED ORDER — WARFARIN SODIUM 5 MG PO TABS: 10.0000 mg | ORAL_TABLET | Freq: Every day | ORAL | Status: DC

## 2015-10-15 MED FILL — WARFARIN SODIUM 5 MG TABLET: 5 | 30 days supply | Qty: 60 | Fill #0

## 2015-10-15 NOTE — Progress Notes (Signed)
Anticoagulation Management Alin A Bagnato is a 50 y.o. male who reports to the clinic for monitoring of warfarin treatment.    Indication: DVT Duration: indefinite  Anticoagulation Clinic Visit History: Patient does not report signs/symptoms of bleeding or thromboembolism. Patient states he hasn't taken warfarin past 2 days due to not having medication and cost barriers.   Anticoagulation Episode Summary    Current INR goal 2.0-3.0  Next INR check 10/25/2015  INR from last check 2.0 (10/15/2015)  Weekly max dose   Target end date   INR check location   Preferred lab   Send INR reminders to [facility]   Indications  Right leg DVT ([place]) [I82.401] Long-term (current) use of anticoagulants [Z79.01] DVT (Resolved) [I82.409] Noncompliance with medication regimen [Z91.14]        Comments       Anticoagulation Care Providers    Provider Role Specialty Phone number   Bartholomew Crews, MD  Internal Medicine [phone]     ASSESSMENT Recent Results: The most recent result is correlated with 55 mg per week: Lab Results  Component Value Date   INR 2.0 10/15/2015   INR 4.7 10/08/2015   INR 1.9 09/24/2015   Anticoagulation Dosing: INR as of 10/15/2015 and Previous Dosing Information    INR Dt INR Goal [employer] Sun Mon Tue Wed Thu Fri Sat   10/15/2015 2.0 2.0-3.0 55 mg 15 mg 15 mg 10 mg 0 mg 0 mg 0 mg 15 mg   Patient deviated from recommended dosing.       Anticoagulation Dose Instructions as of 10/15/2015      Total Sun Mon Tue Wed Thu Fri Sat   New Dose 70 mg 10 mg 10 mg 10 mg 10 mg 10 mg 10 mg 10 mg     (5 mg x 2)  (5 mg x 2)  (5 mg x 2)  (5 mg x 2)  (5 mg x 2)  (5 mg x 2)  (5 mg x 2)                         Description        History of adherence concerns, unclear how patient is actually taking but he states he missed Wednesday and Thursday. Patient also held dose last Friday 10/08/15 due to supratherapeutic INR.      INR today: Therapeutic  PLAN Patient has a  history of missed doses and challenges determining what he is actually taking. He was instructed to take a total of 80 mg weekly but based on patient-reported dosing, he may have taken 55 mg over the past week. Collaborated with Gerald Stabs at [facility] outpatient pharmacy to assist with affordability of warfarin. Advised patient on dosing as above.   Patient Instructions  Patient educated about medication as defined in this encounter and verbalized understanding by repeating back instructions provided.    Patient advised to contact clinic or seek medical attention if signs/symptoms of bleeding or thromboembolism occur.  Patient verbalized understanding by repeating back information and was advised to contact me if further medication-related questions arise. Patient was also provided an information handout.  Follow-up No Follow-up on file.  [name],[name]  15 minutes spent face-to-face with the patient during the encounter. 50% of time spent on education. 50% of time was spent on assessment and plan.  Patient was seen in clinic with Felicity Pellegrini, PharmD candidate.

## 2015-10-15 NOTE — Patient Instructions (Signed)
Patient educated about medication as defined in this encounter and verbalized understanding by repeating back instructions provided.   

## 2015-10-29 ENCOUNTER — Other Ambulatory Visit: Payer: Self-pay

## 2015-11-01 MED ORDER — FLUTICASONE-SALMETEROL 500-50 MCG/DOSE IN AEPB: 1.0000 | INHALATION_SPRAY | Freq: Two times a day (BID) | RESPIRATORY_TRACT | Status: DC

## 2015-11-03 NOTE — Progress Notes (Signed)
I have reviewed Dr. Julianne Rice note.  Patient is on [facility] for VTE.  He missed doses of coumadin and INR was low normal.

## 2015-11-08 ENCOUNTER — Telehealth: Payer: Self-pay | Admitting: Internal Medicine

## 2015-11-08 NOTE — Telephone Encounter (Signed)
APPT. REMINDER CALL, LMTCB °

## 2015-11-09 ENCOUNTER — Ambulatory Visit: Payer: Self-pay

## 2015-11-09 NOTE — Telephone Encounter (Signed)
Phoned in.

## 2015-12-21 ENCOUNTER — Ambulatory Visit: Payer: Self-pay

## 2015-12-22 ENCOUNTER — Ambulatory Visit (INDEPENDENT_AMBULATORY_CARE_PROVIDER_SITE_OTHER): Payer: Self-pay | Admitting: Pharmacist

## 2015-12-22 DIAGNOSIS — Z9114 Patient's other noncompliance with medication regimen: Secondary | ICD-10-CM

## 2015-12-22 DIAGNOSIS — I82511 Chronic embolism and thrombosis of right femoral vein: Secondary | ICD-10-CM

## 2015-12-22 DIAGNOSIS — Z7901 Long term (current) use of anticoagulants: Secondary | ICD-10-CM

## 2015-12-22 LAB — POCT INR: INR: 1.9

## 2015-12-22 NOTE — Patient Instructions (Signed)
Patient educated about medication as defined in this encounter and verbalized understanding by repeating back instructions provided.   

## 2015-12-22 NOTE — Progress Notes (Signed)
Anticoagulation Management Eddie A Depena is a 50 y.o. male who reports to the clinic for monitoring of warfarin treatment.    Indication: DVT Duration: indefinite  Anticoagulation Clinic Visit History: Patient does not report signs/symptoms of bleeding or thromboembolism   Anticoagulation Episode Summary    Current INR goal 2.0-3.0  Next INR check 01/05/2016  INR from last check 1.9! (12/22/2015)  Weekly max dose   Target end date   INR check location   Preferred lab   Send INR reminders to [facility]   Indications  Right leg DVT ([place]) [I82.401] Long-term (current) use of anticoagulants [Z79.01] DVT (Resolved) [I82.409] Noncompliance with medication regimen [Z91.14]        Comments       Anticoagulation Care Providers    Provider Role Specialty Phone number   Bartholomew Crews, MD  Internal Medicine [phone]     ASSESSMENT Recent Results: The most recent result is correlated with 70 mg per week: Lab Results  Component Value Date   INR 1.9 12/22/2015   INR 2.0 10/15/2015   INR 4.7 10/08/2015   Anticoagulation Dosing: INR as of 12/22/2015 and Previous Dosing Information    INR Dt INR Goal [employer] Sun Mon Tue Wed Thu Fri Sat   12/22/2015 1.9 2.0-3.0 70 mg 10 mg 10 mg 10 mg 10 mg 10 mg 10 mg 10 mg   Patient deviated from recommended dosing.       Previous description        History of adherence concerns, unclear how patient is actually taking but he states he missed Wednesday and Thursday. Patient also held dose last Friday 10/08/15 due to supratherapeutic INR.    Anticoagulation Dose Instructions as of 12/22/2015      Total Sun Mon Tue Wed Thu Fri Sat   New Dose 70 mg 10 mg 10 mg 10 mg 10 mg 10 mg 10 mg 10 mg     (5 mg x 2)  (5 mg x 2)  (5 mg x 2)  (5 mg x 2)  (5 mg x 2)  (5 mg x 2)  (5 mg x 2)                         Description        History of adherence concerns, patient states he missed 1 dose this week so will keep regimen the same and  educate patient on adherence.      INR today: Subtherapeutic  PLAN Weekly dose was unchanged. Reinforced adherence instead.  Patient Instructions  Patient educated about medication as defined in this encounter and verbalized understanding by repeating back instructions provided.   Patient advised to contact clinic or seek medical attention if signs/symptoms of bleeding or thromboembolism occur.  Patient verbalized understanding by repeating back information and was advised to contact me if further medication-related questions arise. Patient was also provided an information handout.  Follow-up No Follow-up on file.  Nysa Sarin J  15 minutes spent face-to-face with the patient during the encounter. 50% of time spent on education. 50% of time was spent on assessment and plan.

## 2015-12-26 ENCOUNTER — Emergency Department (HOSPITAL_COMMUNITY): Payer: Self-pay

## 2015-12-26 ENCOUNTER — Emergency Department (HOSPITAL_COMMUNITY)
Admission: EM | Admit: 2015-12-26 | Discharge: 2015-12-26 | Disposition: A | Discharge status: Home / Self Care | Payer: Self-pay | Attending: Emergency Medicine | Admitting: Emergency Medicine

## 2015-12-26 ENCOUNTER — Encounter (HOSPITAL_COMMUNITY): Payer: Self-pay

## 2015-12-26 ENCOUNTER — Other Ambulatory Visit: Payer: Self-pay

## 2015-12-26 DIAGNOSIS — Z7901 Long term (current) use of anticoagulants: Secondary | ICD-10-CM | POA: Insufficient documentation

## 2015-12-26 DIAGNOSIS — F319 Bipolar disorder, unspecified: Secondary | ICD-10-CM | POA: Insufficient documentation

## 2015-12-26 DIAGNOSIS — R0602 Shortness of breath: Secondary | ICD-10-CM

## 2015-12-26 DIAGNOSIS — F17210 Nicotine dependence, cigarettes, uncomplicated: Secondary | ICD-10-CM | POA: Insufficient documentation

## 2015-12-26 DIAGNOSIS — J4520 Mild intermittent asthma, uncomplicated: Secondary | ICD-10-CM | POA: Insufficient documentation

## 2015-12-26 DIAGNOSIS — R079 Chest pain, unspecified: Secondary | ICD-10-CM | POA: Insufficient documentation

## 2015-12-26 DIAGNOSIS — Z79899 Other long term (current) drug therapy: Secondary | ICD-10-CM | POA: Insufficient documentation

## 2015-12-26 LAB — CBC WITH DIFFERENTIAL/PLATELET
Basophils Absolute: 0 10*3/uL (ref 0.0–0.1)
Basophils Relative: 1 %
EOS PCT: 2 %
Eosinophils Absolute: 0.1 10*3/uL (ref 0.0–0.7)
HCT: 47.3 % (ref 39.0–52.0)
Hemoglobin: 15.8 g/dL (ref 13.0–17.0)
LYMPHS ABS: 2.1 10*3/uL (ref 0.7–4.0)
LYMPHS PCT: 34 %
MCH: 31.3 pg (ref 26.0–34.0)
MCHC: 33.4 g/dL (ref 30.0–36.0)
MCV: 93.7 fL (ref 78.0–100.0)
MONO ABS: 0.7 10*3/uL (ref 0.1–1.0)
Monocytes Relative: 11 %
Neutro Abs: 3.3 10*3/uL (ref 1.7–7.7)
Neutrophils Relative %: 52 %
PLATELETS: 195 10*3/uL (ref 150–400)
RBC: 5.05 MIL/uL (ref 4.22–5.81)
RDW: 13.8 % (ref 11.5–15.5)
WBC: 6.2 10*3/uL (ref 4.0–10.5)

## 2015-12-26 LAB — COMPREHENSIVE METABOLIC PANEL
ALT: 20 U/L (ref 17–63)
AST: 19 U/L (ref 15–41)
Albumin: 3.9 g/dL (ref 3.5–5.0)
Alkaline Phosphatase: 70 U/L (ref 38–126)
Anion gap: 8 (ref 5–15)
BUN: 10 mg/dL (ref 6–20)
CHLORIDE: 109 mmol/L (ref 101–111)
CO2: 22 mmol/L (ref 22–32)
CREATININE: 0.95 mg/dL (ref 0.61–1.24)
Calcium: 8.9 mg/dL (ref 8.9–10.3)
Glucose, Bld: 102 mg/dL — ABNORMAL HIGH (ref 65–99)
POTASSIUM: 3.5 mmol/L (ref 3.5–5.1)
Sodium: 139 mmol/L (ref 135–145)
Total Bilirubin: 0.6 mg/dL (ref 0.3–1.2)
Total Protein: 6.7 g/dL (ref 6.5–8.1)

## 2015-12-26 LAB — PROTIME-INR
INR: 1.25 (ref 0.00–1.49)
PROTHROMBIN TIME: 15.8 s — AB (ref 11.6–15.2)

## 2015-12-26 LAB — I-STAT TROPONIN, ED: Troponin i, poc: 0 ng/mL (ref 0.00–0.08)

## 2015-12-26 MED ORDER — PREDNISONE 20 MG PO TABS: ORAL_TABLET | ORAL | Status: DC

## 2015-12-26 MED ORDER — ALBUTEROL SULFATE (2.5 MG/3ML) 0.083% IN NEBU
5.0000 mg | INHALATION_SOLUTION | Freq: Once | RESPIRATORY_TRACT | Status: AC
  Administered 2015-12-26: 5 mg via RESPIRATORY_TRACT
  Filled 2015-12-26: qty 6

## 2015-12-26 MED ORDER — ALBUTEROL SULFATE (2.5 MG/3ML) 0.083% IN NEBU: 5.0000 mg | INHALATION_SOLUTION | Freq: Four times a day (QID) | RESPIRATORY_TRACT | Status: DC | PRN

## 2015-12-26 MED ORDER — IPRATROPIUM BROMIDE 0.02 % IN SOLN
0.5000 mg | Freq: Once | RESPIRATORY_TRACT | Status: AC
  Administered 2015-12-26: 0.5 mg via RESPIRATORY_TRACT
  Filled 2015-12-26: qty 2.5

## 2015-12-26 MED ORDER — METHYLPREDNISOLONE SODIUM SUCC 125 MG IJ SOLR
125.0000 mg | Freq: Once | INTRAMUSCULAR | Status: AC
  Administered 2015-12-26: 125 mg via INTRAVENOUS
  Filled 2015-12-26: qty 2

## 2015-12-26 NOTE — ED Notes (Addendum)
Patient complains of asthma attack upon awakening this am with chest pain and tightness, used albuterol inhaler with minimal relief, no acute distress. Coughing on assessment
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
## 2015-12-26 NOTE — Discharge Instructions (Signed)
Use albuterol nebulizer treatment every 4-6 hours as needed for wheezing/shortness of breath. Use inhaler for rescue. Start prednisone taper tomorrow-- you have already had today's dose. Follow-up with your primary care doctor.  You will need your INR rechecked. Return here for new concerns.

## 2016-01-05 ENCOUNTER — Ambulatory Visit (INDEPENDENT_AMBULATORY_CARE_PROVIDER_SITE_OTHER): Payer: Self-pay | Admitting: Pharmacist

## 2016-01-05 ENCOUNTER — Ambulatory Visit: Payer: Self-pay

## 2016-01-05 ENCOUNTER — Encounter: Payer: Self-pay | Admitting: Pharmacist

## 2016-01-05 DIAGNOSIS — I82511 Chronic embolism and thrombosis of right femoral vein: Secondary | ICD-10-CM

## 2016-01-05 DIAGNOSIS — Z7901 Long term (current) use of anticoagulants: Secondary | ICD-10-CM

## 2016-01-05 DIAGNOSIS — I2699 Other pulmonary embolism without acute cor pulmonale: Secondary | ICD-10-CM

## 2016-01-05 DIAGNOSIS — Z9114 Patient's other noncompliance with medication regimen: Secondary | ICD-10-CM

## 2016-01-05 DIAGNOSIS — I87091 Postthrombotic syndrome with other complications of right lower extremity: Secondary | ICD-10-CM

## 2016-01-05 LAB — POCT INR: INR: 1.7

## 2016-01-05 MED ORDER — PREDNISONE 20 MG PO TABS: ORAL_TABLET | ORAL | Status: DC

## 2016-01-05 MED ORDER — GABAPENTIN 300 MG PO CAPS: 600.0000 mg | ORAL_CAPSULE | Freq: Three times a day (TID) | ORAL | Status: DC

## 2016-01-05 MED ORDER — WARFARIN SODIUM 5 MG PO TABS: 10.0000 mg | ORAL_TABLET | Freq: Every day | ORAL | Status: DC

## 2016-01-05 MED FILL — predniSONE 20 MG TABS: 20 | 9 days supply | Qty: 12 | Fill #0

## 2016-01-05 MED FILL — WARFARIN SODIUM 5 MG TABLET: 5 | 30 days supply | Qty: 66 | Fill #0

## 2016-01-05 MED FILL — GABAPENTIN 300 MG CAPSULE: 300 | 30 days supply | Qty: 180 | Fill #0

## 2016-01-05 NOTE — Progress Notes (Signed)
Anticoagulation Management Veto A Below is a 50 y.o. male who reports to the clinic for monitoring of warfarin treatment.    Indication: DVT Duration: indefinite  Anticoagulation Clinic Visit History: Patient does not report signs/symptoms of bleeding or thromboembolism. Patient describes chronic right leg pain for which he is establishing a follow up appointment.   Of note, patient had an ED visit 10 days ago and was prescribed prednisone for asthma exacerbation, did not pick up the prednisone due to cost and also unable to afford warfarin or gabapentin. States he is using albuterol inhaler daily throughout the day and twice nightly. He is currently in transition updating his orange card and is awaiting appointment availability with financial counselor. Contacted [facility] outpatient pharmacy and received approval for 1-time fill of these medications.  Patient stated he had misunderstood the dosing information on his Advair inhaler and has been taking once daily. Notified patient to take the Advair 1 puff twice daily. Patient verbalized understanding and states he will adjust dosing accordingly.  Anticoagulation Episode Summary    Current INR goal 2.0-3.0  Next INR check 01/19/2016  INR from last check 1.7! (01/05/2016)  Weekly max dose   Target end date   INR check location   Preferred lab   Send INR reminders to [facility]   Indications  Right leg DVT ([place]) [I82.401] Long-term (current) use of anticoagulants [Z79.01] DVT (Resolved) [I82.409] Noncompliance with medication regimen [Z91.14]        Comments       Anticoagulation Care Providers    Provider Role Specialty Phone number   Bartholomew Crews, MD  Internal Medicine [phone]     ASSESSMENT Recent Results: The most recent result is correlated with 70 mg per week: Lab Results  Component Value Date   INR 1.7 01/05/2016   INR 1.25 12/26/2015   INR 1.9 12/22/2015   Anticoagulation Dosing: INR as of 01/05/2016 and  Previous Dosing Information    INR Dt INR Goal [name] Sun Mon Tue Wed Thu Fri Sat   01/05/2016 1.7 2.0-3.0 70 mg 10 mg 10 mg 10 mg 10 mg 10 mg 10 mg 10 mg    Previous description        History of adherence concerns, patient states he missed 1 dose this week so will keep regimen the same and educate patient on adherence.    Anticoagulation Dose Instructions as of 01/05/2016      Total Sun Mon Tue Wed Thu Fri Sat   New Dose 77.5 mg 10 mg 12.5 mg 10 mg 12.5 mg 10 mg 12.5 mg 10 mg     (5 mg x 2)  (5 mg x 2.5)  (5 mg x 2)  (5 mg x 2.5)  (5 mg x 2)  (5 mg x 2.5)  (5 mg x 2)                           INR today: Subtherapeutic  PLAN Weekly dose was increased by approximately 10% to 77.5 mg per week  Patient Instructions  Patient educated about medication as defined in this encounter and verbalized understanding by repeating back instructions provided.   Patient advised to contact clinic or seek medical attention if signs/symptoms of bleeding or thromboembolism occur.  Patient verbalized understanding by repeating back information and was advised to contact me if further medication-related questions arise. Patient was also provided an information handout.  Follow-up 2 weeks  [name],[name]  30 minutes spent face-to-face with the patient during the encounter. 50% of time spent on education. 50% of time was spent on assessment, plan, coordination of care.

## 2016-01-05 NOTE — Patient Instructions (Signed)
Patient educated about medication as defined in this encounter and verbalized understanding by repeating back instructions provided.   

## 2016-01-19 ENCOUNTER — Ambulatory Visit (INDEPENDENT_AMBULATORY_CARE_PROVIDER_SITE_OTHER): Payer: Self-pay | Admitting: Pharmacist

## 2016-01-19 ENCOUNTER — Ambulatory Visit (INDEPENDENT_AMBULATORY_CARE_PROVIDER_SITE_OTHER): Payer: Self-pay | Admitting: Internal Medicine

## 2016-01-19 ENCOUNTER — Encounter: Payer: Self-pay | Admitting: Internal Medicine

## 2016-01-19 VITALS — BP 130/88 | HR 91 | Temp 98.4°F | Ht 76.0 in | Wt 255.3 lb

## 2016-01-19 DIAGNOSIS — G8929 Other chronic pain: Secondary | ICD-10-CM

## 2016-01-19 DIAGNOSIS — R202 Paresthesia of skin: Secondary | ICD-10-CM

## 2016-01-19 DIAGNOSIS — Z9114 Patient's other noncompliance with medication regimen: Secondary | ICD-10-CM

## 2016-01-19 DIAGNOSIS — G5713 Meralgia paresthetica, bilateral lower limbs: Secondary | ICD-10-CM | POA: Insufficient documentation

## 2016-01-19 DIAGNOSIS — Z7901 Long term (current) use of anticoagulants: Secondary | ICD-10-CM

## 2016-01-19 DIAGNOSIS — I82511 Chronic embolism and thrombosis of right femoral vein: Secondary | ICD-10-CM

## 2016-01-19 DIAGNOSIS — M25552 Pain in left hip: Secondary | ICD-10-CM

## 2016-01-19 DIAGNOSIS — G5712 Meralgia paresthetica, left lower limb: Secondary | ICD-10-CM

## 2016-01-19 DIAGNOSIS — M79662 Pain in left lower leg: Secondary | ICD-10-CM

## 2016-01-19 DIAGNOSIS — M79652 Pain in left thigh: Secondary | ICD-10-CM

## 2016-01-19 LAB — POCT INR: INR: 1.9

## 2016-01-19 MED ORDER — DICLOFENAC SODIUM 1 % TD GEL: 2.0000 g | Freq: Four times a day (QID) | TRANSDERMAL | Status: DC

## 2016-01-19 MED FILL — DICLOFENAC SODIUM 1% GEL: 1 | 13 days supply | Qty: 100 | Fill #0

## 2016-01-19 MED FILL — traZODone HCL 100 MG TABS: 100 | 30 days supply | Qty: 30 | Fill #0

## 2016-01-19 NOTE — Patient Instructions (Signed)
It was a pleasure seeing you today. Thank you for choosing [facility] for your healthcare needs.  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

## 2016-01-19 NOTE — Assessment & Plan Note (Signed)
Patient presents with acute worsening of chronic left hip, thigh and leg pain. The pain has been on and off for the last several months. It starts in his left hip and runs down the lateral aspect of his thigh and leg. The pain is a sharp burning pain and is a 10/10 when it is at its worse. He says the pain improved several months ago but then started to get bad again about 5 weeks ago. Nothing seems to improve the pain and it is worsened with physical activity and hurts most at the end of the day. He also has some numbness in his left thigh and on exanimation sensation to soft touch is decreased on the left lateral thigh. He denies any new weakness, urinary incontinence or saddle anesthesia. 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 At this time we do not think further workup is warranted and we will treat symptomatically.  -- Start Voltaren gel up to 4 times daily as needed over painful areas -- Do not do aggravating activities -- Continue to work on weight loss as this will help with the chronicity of the condition -- Increase flexibility with stretching exercises -- Return if symptoms worsen or if you experience urinary incontinence, weakness or saddle anesthesia

## 2016-01-19 NOTE — Progress Notes (Signed)
INTERNAL MEDICINE TEACHING ATTENDING ADDENDUM - Lucious Groves, DO  Duration- recurrent VTE, Indication- lifelong, INR- 1.9. Agree with pharmacy recommendations as outlined in their note. Continue same dose, stress compliance and follow up in 2 weeks

## 2016-01-19 NOTE — Progress Notes (Signed)
CC: Left leg pain HPI: [name] is a 50 y.o. male with a h/o of Depression, GERD, Substance abuse, DVT, Asthma, and allergic rhinitis who presents with acute on chronic left hip, thigh and leg pain. 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 He denies any headaches, shortness of breath, chest pain, n/v and abdominal pain, or changes in his bowel or bladder habits.  Please see problem-based charting for status of medical issues pertinent to this visit.     Past Medical History  Diagnosis Date  . DVT (deep venous thrombosis) (HCC)     on Coumadin Rx  . Substance abuse     crack, cocaine, last use 2007  . Tobacco abuse   . DVT (deep venous thrombosis) ([place])   . Bronchitis     last time 2 yrs ago  . Heart murmur   . Shortness of breath   . Tuberculosis     ' I test Positive "  . Asthma     uses Albuterol daily as needed  . Cough     smokers  . Arthritis     knees  . Joint pain   . GERD (gastroesophageal reflux disease)     only takes something about 2 times a yr  . Cellulitis   . Bipolar 1 disorder ([place])   . Alcohol abuse   . Pulmonary embolism ([place]) 2014  . Chronic dermatitis     due to Xarelto Rx  . Depression   . GERD (gastroesophageal reflux disease)    Current Outpatient Rx  Name  Route  Sig  Dispense  Refill  . albuterol (PROVENTIL) (2.5 MG/3ML) 0.083% nebulizer solution   Nebulization   Take 6 mLs (5 mg total) by nebulization every 6 (six) hours as needed for wheezing or shortness of breath.   75 mL   0   . augmented betamethasone dipropionate (DIPROLENE-AF) 0.05 % ointment   Topical   Apply topically 2 (two) times daily. 340B patient, [place] [phone]   50 g   1   . DULoxetine (CYMBALTA) 60 MG capsule   Oral   Take 1 capsule (60 mg total) by mouth daily.   90 capsule   3   . Fluticasone-Salmeterol (ADVAIR DISKUS) 500-50 MCG/DOSE AEPB   Inhalation   Inhale 1 puff into the lungs 2 (two) times daily.   1 each   6   .  furosemide (LASIX) 40 MG tablet   Oral   Take 1 tablet (40 mg total) by mouth daily. Patient not taking: Reported on 12/26/2015   30 tablet   0   . gabapentin (NEURONTIN) 300 MG capsule   Oral   Take 2 capsules (600 mg total) by mouth 3 (three) times daily. 340B [facility]   180 capsule   0   . hydrocerin (EUCERIN) CREA   Topical   Apply 1 application topically daily.   454 g   0   . hydrOXYzine (ATARAX/VISTARIL) 25 MG tablet   Oral   Take 1 tablet (25 mg total) by mouth 3 (three) times daily as needed for itching.   30 tablet   6   . nicotine (NICODERM CQ - DOSED IN MG/24 HOURS) 14 mg/24hr patch   Transdermal   Place 1 patch (14 mg total) onto the skin daily. Patient not taking: Reported on 12/26/2015   42 patch   0   . predniSONE (DELTASONE) 20 MG tablet      Take 40 mg by mouth  daily for 3 days, then 20mg  by mouth daily for 3 days, then 10mg  daily for 3 days. 340B [facility]   12 tablet   0   . traZODone (DESYREL) 100 MG tablet   Oral   Take 1 tablet (100 mg total) by mouth at bedtime as needed for sleep.   30 tablet   11   . warfarin (COUMADIN) 5 MG tablet   Oral   Take 2 tablets (10 mg total) by mouth daily. Except 2.5 tablets on M,W,F. [facility] 340B   66 tablet   0      Review of Systems: A complete ROS was negative except as per HPI.  Physical Exam: There were no vitals filed for this visit. General appearance: alert, cooperative and mildly obese Head: Normocephalic, without obvious abnormality, atraumatic Lungs: clear to auscultation bilaterally Heart: regular rate and rhythm, S1, S2 normal, no murmur, click, rub or gallop Abdomen: soft, non-tender; bowel sounds normal; no masses,  no organomegaly and no bruits appreciated MSK: Equal strength in the bilateral lower extremities, Mild decreased sensation over the left lateral and anterior thigh compared with the right, no spinal, paraspinal or CVA tenderness. No pain to palpation over the left hip  Assessment &  Plan:  See encounters tab for problem based medical decision making. Patient seen with Dr. Dareen Piano  Signed: Ophelia Shoulder, MD 01/19/2016, 9:42 AM  Pager: [phone]

## 2016-01-19 NOTE — Patient Instructions (Signed)
Patient educated about medication as defined in this encounter and verbalized understanding by repeating back instructions provided.   

## 2016-01-19 NOTE — Progress Notes (Signed)
Anticoagulation Management [name] [name] is a 50 y.o. male who reports to the clinic for monitoring of warfarin treatment.    Indication: DVT Duration: indefinite  Anticoagulation Clinic Visit History: Patient does not report signs/symptoms of bleeding or thromboembolism  Other recent changes: none  Anticoagulation Episode Summary    Current INR goal 2.0-3.0  Next INR check 01/19/2016  INR from last check 1.7! (01/05/2016)  Weekly max dose   Target end date   INR check location   Preferred lab   Send INR reminders to ANTICOAG [facility]   Indications  Right leg DVT ([place]) [I82.401] Long-term (current) use of anticoagulants [Z79.01] DVT (Resolved) [I82.409] Noncompliance with medication regimen [Z91.14]        Comments       Anticoagulation Care Providers    Provider Role Specialty Phone number   Bartholomew Crews, MD  Internal Medicine [phone]     ASSESSMENT Recent Results: The most recent result is correlated with 77.5 mg per week: Lab Results  Component Value Date   INR 1.7 01/05/2016   INR 1.25 12/26/2015   INR 1.9 12/22/2015    Anticoagulation Dosing: INR as of 01/19/2016 and Previous Dosing Information    INR Dt INR Goal Wkly Tot Sun Mon Tue Wed Thu Fri Sat   01/19/2016 1.9 2.0-3.0 77.5 mg 10 mg 12.5 mg 10 mg 12.5 mg 10 mg 12.5 mg 10 mg    Previous description        History of adherence concerns, patient states he not miss any doses since his last visit so will keep regimen the same and re-evaluate INR in two weeks.    Anticoagulation Dose Instructions as of 01/19/2016      Total Sun Mon Tue Wed Thu Fri Sat   New Dose 77.5 mg 10 mg 12.5 mg 10 mg 12.5 mg 10 mg 12.5 mg 10 mg     (5 mg x 2)  (5 mg x 2.5)  (5 mg x 2)  (5 mg x 2.5)  (5 mg x 2)  (5 mg x 2.5)  (5 mg x 2)                           INR today: Subtherapeutic  PLAN Weekly dose was unchanged by 0% to 77.5 mg per week  Patient advised to contact clinic or seek medical attention if  signs/symptoms of bleeding or thromboembolism occur.  Patient verbalized understanding by repeating back information and was advised to contact me if further medication-related questions arise. Patient was also provided an information handout.  Follow-up Patient was instructed to return in two weeks for follow-up.  Tanya Makhlouf  20 minutes spent face-to-face with the patient during the encounter. 50% of time spent on education. 50% of time was spent on assessment and plan.

## 2016-01-21 ENCOUNTER — Emergency Department (HOSPITAL_COMMUNITY): Payer: Self-pay

## 2016-01-21 ENCOUNTER — Emergency Department (HOSPITAL_COMMUNITY)
Admission: EM | Admit: 2016-01-21 | Discharge: 2016-01-21 | Disposition: A | Discharge status: Home / Self Care | Payer: Self-pay | Attending: Emergency Medicine | Admitting: Emergency Medicine

## 2016-01-21 ENCOUNTER — Encounter (HOSPITAL_COMMUNITY): Payer: Self-pay | Admitting: Emergency Medicine

## 2016-01-21 DIAGNOSIS — S86811A Strain of other muscle(s) and tendon(s) at lower leg level, right leg, initial encounter: Secondary | ICD-10-CM | POA: Insufficient documentation

## 2016-01-21 DIAGNOSIS — W19XXXA Unspecified fall, initial encounter: Secondary | ICD-10-CM | POA: Insufficient documentation

## 2016-01-21 DIAGNOSIS — Z79899 Other long term (current) drug therapy: Secondary | ICD-10-CM | POA: Insufficient documentation

## 2016-01-21 DIAGNOSIS — S8391XA Sprain of unspecified site of right knee, initial encounter: Secondary | ICD-10-CM

## 2016-01-21 DIAGNOSIS — Y929 Unspecified place or not applicable: Secondary | ICD-10-CM | POA: Insufficient documentation

## 2016-01-21 DIAGNOSIS — F17210 Nicotine dependence, cigarettes, uncomplicated: Secondary | ICD-10-CM | POA: Insufficient documentation

## 2016-01-21 DIAGNOSIS — Y999 Unspecified external cause status: Secondary | ICD-10-CM | POA: Insufficient documentation

## 2016-01-21 DIAGNOSIS — Y9301 Activity, walking, marching and hiking: Secondary | ICD-10-CM | POA: Insufficient documentation

## 2016-01-21 DIAGNOSIS — J45909 Unspecified asthma, uncomplicated: Secondary | ICD-10-CM | POA: Insufficient documentation

## 2016-01-21 DIAGNOSIS — Z7901 Long term (current) use of anticoagulants: Secondary | ICD-10-CM | POA: Insufficient documentation

## 2016-01-21 MED ORDER — OXYCODONE-ACETAMINOPHEN 5-325 MG PO TABS
2.0000 | ORAL_TABLET | Freq: Once | ORAL | Status: AC
  Administered 2016-01-21: 2 via ORAL
  Filled 2016-01-21: qty 2

## 2016-01-21 MED ORDER — TRAMADOL HCL 50 MG PO TABS: 50.0000 mg | ORAL_TABLET | Freq: Four times a day (QID) | ORAL | Status: DC | PRN
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
## 2016-01-21 NOTE — Progress Notes (Signed)
Internal Medicine Clinic Attending  I saw and evaluated the patient.  I personally confirmed the key portions of the history and exam documented by Dr. Taylor and I reviewed pertinent patient test results.  The assessment, diagnosis, and plan were formulated together and I agree with the documentation in the resident's note.  

## 2016-01-21 NOTE — ED Notes (Signed)
Pt to xray

## 2016-01-21 NOTE — ED Notes (Signed)
EDP at bedside  

## 2016-01-21 NOTE — ED Notes (Signed)
Pt states he was walking in his driveway and fell on his right side. C/o R knee and R foot pain. Pt normally uses cane or walker at home due to neuropathy. Pt A/O x4. Was ambulatory on scene.

## 2016-01-21 NOTE — ED Notes (Signed)
Pt back from x-ray.

## 2016-02-03 ENCOUNTER — Ambulatory Visit: Payer: Self-pay | Admitting: Pharmacist

## 2016-02-07 ENCOUNTER — Encounter (HOSPITAL_COMMUNITY): Payer: Self-pay | Admitting: Emergency Medicine

## 2016-02-07 ENCOUNTER — Emergency Department (HOSPITAL_COMMUNITY): Payer: Self-pay

## 2016-02-07 ENCOUNTER — Emergency Department (HOSPITAL_COMMUNITY)
Admission: EM | Admit: 2016-02-07 | Discharge: 2016-02-07 | Disposition: A | Discharge status: Home / Self Care | Payer: Self-pay | Attending: Emergency Medicine | Admitting: Emergency Medicine

## 2016-02-07 DIAGNOSIS — I5032 Chronic diastolic (congestive) heart failure: Secondary | ICD-10-CM | POA: Insufficient documentation

## 2016-02-07 DIAGNOSIS — Y929 Unspecified place or not applicable: Secondary | ICD-10-CM | POA: Insufficient documentation

## 2016-02-07 DIAGNOSIS — J45909 Unspecified asthma, uncomplicated: Secondary | ICD-10-CM | POA: Insufficient documentation

## 2016-02-07 DIAGNOSIS — F17210 Nicotine dependence, cigarettes, uncomplicated: Secondary | ICD-10-CM | POA: Insufficient documentation

## 2016-02-07 DIAGNOSIS — Y939 Activity, unspecified: Secondary | ICD-10-CM | POA: Insufficient documentation

## 2016-02-07 DIAGNOSIS — Z7901 Long term (current) use of anticoagulants: Secondary | ICD-10-CM | POA: Insufficient documentation

## 2016-02-07 DIAGNOSIS — W228XXA Striking against or struck by other objects, initial encounter: Secondary | ICD-10-CM | POA: Insufficient documentation

## 2016-02-07 DIAGNOSIS — Y999 Unspecified external cause status: Secondary | ICD-10-CM | POA: Insufficient documentation

## 2016-02-07 DIAGNOSIS — S60452A Superficial foreign body of right middle finger, initial encounter: Secondary | ICD-10-CM | POA: Insufficient documentation

## 2016-02-07 MED ORDER — LIDOCAINE HCL 2 % IJ SOLN
10.0000 mL | Freq: Once | INTRAMUSCULAR | Status: AC
  Administered 2016-02-07: 200 mg via INTRADERMAL
  Filled 2016-02-07: qty 20

## 2016-02-07 MED ORDER — CEPHALEXIN 500 MG PO CAPS: 500.0000 mg | ORAL_CAPSULE | Freq: Four times a day (QID) | ORAL | 0 refills | Status: DC
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
## 2016-02-07 NOTE — ED Triage Notes (Signed)
Pt. reports foreign body at right distal middle finger sustained today when he hit it against the carpet , presents with pain and mild swelling . No bleeding /skin intact .

## 2016-02-14 ENCOUNTER — Other Ambulatory Visit: Payer: Self-pay | Admitting: Pharmacist

## 2016-02-14 ENCOUNTER — Other Ambulatory Visit: Payer: Self-pay | Admitting: Internal Medicine

## 2016-02-14 DIAGNOSIS — I87091 Postthrombotic syndrome with other complications of right lower extremity: Secondary | ICD-10-CM

## 2016-02-14 DIAGNOSIS — I2699 Other pulmonary embolism without acute cor pulmonale: Secondary | ICD-10-CM

## 2016-02-14 DIAGNOSIS — Z7901 Long term (current) use of anticoagulants: Secondary | ICD-10-CM

## 2016-02-14 MED ORDER — WARFARIN SODIUM 5 MG PO TABS
10.0000 mg | ORAL_TABLET | Freq: Every day | ORAL | 0 refills | Status: DC
Start: 2016-02-14 — End: 2016-02-22

## 2016-02-14 MED FILL — WARFARIN SODIUM 5 MG TABLET: 5 | 30 days supply | Qty: 66 | Fill #0

## 2016-02-17 ENCOUNTER — Ambulatory Visit: Payer: Self-pay | Admitting: Pharmacist

## 2016-02-22 ENCOUNTER — Ambulatory Visit (INDEPENDENT_AMBULATORY_CARE_PROVIDER_SITE_OTHER): Payer: Self-pay | Admitting: Pharmacist

## 2016-02-22 DIAGNOSIS — Z9114 Patient's other noncompliance with medication regimen: Secondary | ICD-10-CM

## 2016-02-22 DIAGNOSIS — I82511 Chronic embolism and thrombosis of right femoral vein: Secondary | ICD-10-CM

## 2016-02-22 DIAGNOSIS — I2699 Other pulmonary embolism without acute cor pulmonale: Secondary | ICD-10-CM

## 2016-02-22 DIAGNOSIS — Z7901 Long term (current) use of anticoagulants: Secondary | ICD-10-CM

## 2016-02-22 DIAGNOSIS — I87091 Postthrombotic syndrome with other complications of right lower extremity: Secondary | ICD-10-CM

## 2016-02-22 LAB — POCT INR: INR: 1.3

## 2016-02-22 MED ORDER — WARFARIN SODIUM 5 MG PO TABS: 12.5000 mg | ORAL_TABLET | Freq: Every day | ORAL | 0 refills | Status: DC

## 2016-02-22 NOTE — Patient Instructions (Signed)
Patient educated as defined in the encounter.

## 2016-02-22 NOTE — Progress Notes (Signed)
Patient was seen in clinic with Terald Sleeper, PharmD candidate. I agree with the assessment and plan of care documented.

## 2016-02-22 NOTE — Progress Notes (Signed)
Anticoagulation Management [name] [name] is a 50 y.o. male who reports to the clinic for monitoring of warfarin treatment.    Indication: Chronic venous insufficiency secondary to R leg DVT, prior bilateral PE, long-term current use anticoagulantsCHA2DS2VASC score 3, HAS-BLED 2 Duration: indefinite  Anticoagulation Clinic Visit History: Patient does not report signs/symptoms of bleeding or thromboembolism No recent changes in diet, medications, lifestyle Anticoagulation Episode Summary    Current INR goal:   2.0-3.0  TTR:   23.4 % (10.5 mo)  Next INR check:   02/03/2016  INR from last check:   1.9! (01/19/2016)  Most recent INR:    1.3! (02/22/2016)  Weekly max dose:     Target end date:     INR check location:     Preferred lab:     Send INR reminders to:   [facility]   Indications   Right leg DVT ([name]) [I82.401] Long-term (current) use of anticoagulants [Z79.01] DVT (Resolved) [I82.409] Noncompliance with medication regimen [Z91.14]       Comments:         Anticoagulation Care Providers    Provider Role Specialty Phone number   Bartholomew Crews, MD  Internal Medicine [phone]     ASSESSMENT Recent Results: The most recent result is correlated with 77.5 mg per week: Lab Results  Component Value Date   INR 1.3 02/22/2016   INR 1.9 01/19/2016   INR 1.7 01/05/2016    Anticoagulation Dosing: INR as of 01/19/2016 and Previous Dosing Information    INR Dt INR Goal [name] Sun Mon Tue Wed Thu Fri Sat   01/19/2016 1.9 2.0-3.0 77.5 mg 10 mg 12.5 mg 10 mg 12.5 mg 10 mg 12.5 mg 10 mg    Anticoagulation Dose Instructions as of 01/19/2016      Total Sun Mon Tue Wed Thu Fri Sat   New Dose 77.5 mg 10 mg 12.5 mg 10 mg 12.5 mg 10 mg 12.5 mg 10 mg     (5 mg x 2)  (5 mg x 2.5)  (5 mg x 2)  (5 mg x 2.5)  (5 mg x 2)  (5 mg x 2.5)  (5 mg x 2)                           INR today: Subtherapeutic  PLAN Weekly dose was increased by 13% to 87.5 mg per week  Patient  advised to contact clinic or seek medical attention if signs/symptoms of bleeding or thromboembolism occur.  Patient verbalized understanding by repeating back information and was advised to contact me if further medication-related questions arise. Patient was also provided an information handout. Patient educated as defined in the encounter.  Follow-up Patient will follow up in 1 week (02/29/16)  Terald Sleeper PharmD Candidate, c/o 2019 02/22/2016 5:04 PM  ~20 minutes spent face-to-face with the patient during the encounter. 25% of time spent on education. 75% of time was spent on assessment and plan.

## 2016-02-29 ENCOUNTER — Ambulatory Visit (INDEPENDENT_AMBULATORY_CARE_PROVIDER_SITE_OTHER): Payer: Self-pay | Admitting: Pharmacist

## 2016-02-29 ENCOUNTER — Other Ambulatory Visit: Payer: Self-pay | Admitting: Pharmacist

## 2016-02-29 DIAGNOSIS — Z9114 Patient's other noncompliance with medication regimen: Secondary | ICD-10-CM

## 2016-02-29 DIAGNOSIS — Z91148 Patient's other noncompliance with medication regimen for other reason: Secondary | ICD-10-CM

## 2016-02-29 DIAGNOSIS — Z7901 Long term (current) use of anticoagulants: Secondary | ICD-10-CM

## 2016-02-29 DIAGNOSIS — M792 Neuralgia and neuritis, unspecified: Secondary | ICD-10-CM

## 2016-02-29 DIAGNOSIS — G4700 Insomnia, unspecified: Secondary | ICD-10-CM

## 2016-02-29 DIAGNOSIS — I82511 Chronic embolism and thrombosis of right femoral vein: Secondary | ICD-10-CM

## 2016-02-29 LAB — POCT INR: INR: 1.9

## 2016-02-29 MED ORDER — TRAZODONE HCL 100 MG PO TABS: 100.0000 mg | ORAL_TABLET | Freq: Every evening | ORAL | 11 refills | Status: DC | PRN

## 2016-02-29 MED ORDER — GABAPENTIN 300 MG PO CAPS: 600.0000 mg | ORAL_CAPSULE | Freq: Three times a day (TID) | ORAL | 0 refills | Status: DC

## 2016-02-29 NOTE — Progress Notes (Addendum)
Patient was seen in clinic with Terald Sleeper, PharmD candidate. I agree with the assessment and plan of care documented.  Prescription from trazodone re-sent to [place] due to pharmacy not receiving

## 2016-02-29 NOTE — Addendum Note (Signed)
Addended by: Forde Dandy on: 02/29/2016 03:37 PM   Modules accepted: Orders

## 2016-02-29 NOTE — Progress Notes (Signed)
Anticoagulation Management [name] [name] is a 50 y.o. male who reports to the clinic for monitoring of warfarin treatment.    Indication: R Leg DVT, Long term (current) use of anticoagulants, DVT (resolved) Duration: indefinite  Anticoagulation Clinic Visit History: Patient does not report signs/symptoms of bleeding or thromboembolism  Other recent changes: No recent diet, medications, lifestyle Anticoagulation Episode Summary    Current INR goal:   2.0-3.0  TTR:   22.9 % (10.7 mo)  Next INR check:   03/07/2016  INR from last check:   1.9! (02/29/2016)  Weekly max dose:     Target end date:     INR check location:     Preferred lab:     Send INR reminders to:   [facility]   Indications   Right leg DVT ([place]) [I82.401] Long-term (current) use of anticoagulants [Z79.01] DVT (Resolved) [I82.409] Noncompliance with medication regimen [Z91.14]       Comments:         Anticoagulation Care Providers    Provider Role Specialty Phone number   Bartholomew Crews, MD  Internal Medicine [phone]     ASSESSMENT Recent Results: The most recent result is correlated with 87.5 mg per week: Lab Results  Component Value Date   INR 1.9 02/29/2016   INR 1.3 02/22/2016   INR 1.9 01/19/2016    Anticoagulation Dosing: INR as of 02/29/2016 and Previous Dosing Information    INR Dt INR Goal [name] Sun Mon Tue Wed Thu Fri Sat   02/29/2016 1.9 2.0-3.0 87.5 mg 12.5 mg 12.5 mg 12.5 mg 12.5 mg 12.5 mg 12.5 mg 12.5 mg    Anticoagulation Dose Instructions as of 02/29/2016      Total Sun Mon Tue Wed Thu Fri Sat   New Dose 87.5 mg 12.5 mg 12.5 mg 12.5 mg 12.5 mg 12.5 mg 12.5 mg 12.5 mg     (5 mg x 2.5)  (5 mg x 2.5)  (5 mg x 2.5)  (5 mg x 2.5)  (5 mg x 2.5)  (5 mg x 2.5)  (5 mg x 2.5)                           INR today: Subtherapeutic  PLAN Weekly dose was unchanged. Patient continued on 87.5 mg/week. Since patient's INR was borderline, continued same weekly dose.   Patient  advised to contact clinic or seek medical attention if signs/symptoms of bleeding or thromboembolism occur.  Patient verbalized understanding by repeating back information and was advised to contact me if further medication-related questions arise. Patient was also provided an information handout.  Follow-up Pt scheduled for f/u on August 29th.  Terald Sleeper PharmD Candidate, c/o 2019 02/29/2016 10:38 AM  30 minutes spent face-to-face with the patient during the encounter. 50% of time spent on education. 50% of time was spent on checking INR and adjusting regimen.

## 2016-03-07 ENCOUNTER — Ambulatory Visit (INDEPENDENT_AMBULATORY_CARE_PROVIDER_SITE_OTHER): Payer: Self-pay

## 2016-03-07 ENCOUNTER — Ambulatory Visit: Payer: Self-pay

## 2016-03-07 ENCOUNTER — Other Ambulatory Visit: Payer: Self-pay | Admitting: Student-PharmD

## 2016-03-07 DIAGNOSIS — I2699 Other pulmonary embolism without acute cor pulmonale: Secondary | ICD-10-CM

## 2016-03-07 DIAGNOSIS — I82511 Chronic embolism and thrombosis of right femoral vein: Secondary | ICD-10-CM

## 2016-03-07 DIAGNOSIS — Z9114 Patient's other noncompliance with medication regimen: Secondary | ICD-10-CM

## 2016-03-07 DIAGNOSIS — I87091 Postthrombotic syndrome with other complications of right lower extremity: Secondary | ICD-10-CM

## 2016-03-07 DIAGNOSIS — M792 Neuralgia and neuritis, unspecified: Secondary | ICD-10-CM

## 2016-03-07 DIAGNOSIS — Z7901 Long term (current) use of anticoagulants: Secondary | ICD-10-CM

## 2016-03-07 LAB — POCT INR: INR: 1.5

## 2016-03-07 MED ORDER — GABAPENTIN 300 MG PO CAPS: 600.0000 mg | ORAL_CAPSULE | Freq: Three times a day (TID) | ORAL | 0 refills | Status: DC

## 2016-03-07 MED ORDER — WARFARIN SODIUM 5 MG PO TABS: ORAL_TABLET | ORAL | 0 refills | Status: DC

## 2016-03-07 MED FILL — GABAPENTIN 300 MG CAPSULE: 300 | 30 days supply | Qty: 180 | Fill #0

## 2016-03-07 NOTE — Patient Instructions (Signed)
Dose response instructions provided.

## 2016-03-07 NOTE — Progress Notes (Signed)
Anticoagulation Management [name] [name] is a 50 y.o. male who reports to the clinic in good spirits for monitoring of warfarin treatment.    Indication: Chronic venous insufficiency secondary to R DVT, h/o PE, long-term current use of anticoagulants Duration: indefinite  Anticoagulation Clinic Visit History: Patient does not report signs/symptoms of bleeding or thromboembolism. He denies recent changes in diet, medications, lifestyle.   Anticoagulation Episode Summary    Current INR goal:   2.0-3.0  TTR:   22.5 % (11 mo)  Next INR check:   03/14/2016  INR from last check:   1.5! (03/07/2016)  Weekly max dose:     Target end date:     INR check location:     Preferred lab:     Send INR reminders to:   [facility]   Indications   Right leg DVT ([place]) [I82.401] Long-term (current) use of anticoagulants [Z79.01] DVT (Resolved) [I82.409] Noncompliance with medication regimen [Z91.14]       Comments:         Anticoagulation Care Providers    Provider Role Specialty Phone number   Bartholomew Crews, MD  Internal Medicine [phone]     ASSESSMENT Recent Results: The most recent result is correlated with 87.5 mg per week: Lab Results  Component Value Date   INR 1.5 03/07/2016   INR 1.9 02/29/2016   INR 1.3 02/22/2016    Anticoagulation Dosing: INR as of 03/07/2016 and Previous Dosing Information    INR Dt INR Goal [name] Sun Mon Tue Wed Thu Fri Sat   03/07/2016 1.5 2.0-3.0 87.5 mg 12.5 mg 12.5 mg 12.5 mg 12.5 mg 12.5 mg 12.5 mg 12.5 mg    Anticoagulation Dose Instructions as of 03/07/2016      Total Sun Mon Tue Wed Thu Fri Sat   New Dose 100 mg 12.5 mg 15 mg 15 mg 15 mg 15 mg 15 mg 12.5 mg     (5 mg x 2.5)  (5 mg x 3)  (5 mg x 3)  (5 mg x 3)  (5 mg x 3)  (5 mg x 3)  (5 mg x 2.5)                           INR today: Subtherapeutic  PLAN Weekly dose was increased by 14% to 100 mg per week  Patient advised to contact clinic or seek medical attention if  signs/symptoms of bleeding or thromboembolism occur.  Patient verbalized understanding by repeating back information and was advised to contact me if further medication-related questions arise. Patient was also provided an information handout.  Follow-up Return in about 1 week (around 03/14/2016) for INR check.  Please consider tablet strength change when INR stabilizes.   Belia Heman, PharmD PGY1 Resident 03/07/2016 9:06 AM  20 minutes spent face-to-face with the patient during the encounter. 80% of time spent on education. 20% of time was spent on assessment.

## 2016-03-09 MED FILL — WARFARIN SODIUM 5 MG TABLET: 5 | 28 days supply | Qty: 80 | Fill #0

## 2016-03-09 MED FILL — traZODone HCL 100 MG TABS: 100 | 30 days supply | Qty: 30 | Fill #0

## 2016-03-14 ENCOUNTER — Other Ambulatory Visit (HOSPITAL_COMMUNITY): Payer: Self-pay | Admitting: Internal Medicine

## 2016-03-14 MED ORDER — HYDROCERIN EX CREA: 1.0000 "application " | TOPICAL_CREAM | Freq: Every day | CUTANEOUS | 0 refills | Status: DC

## 2016-03-14 NOTE — Telephone Encounter (Signed)
Requesting refill on Hydrocerin.

## 2016-03-15 ENCOUNTER — Ambulatory Visit (INDEPENDENT_AMBULATORY_CARE_PROVIDER_SITE_OTHER): Payer: Self-pay | Admitting: Pharmacist

## 2016-03-15 DIAGNOSIS — Z9114 Patient's other noncompliance with medication regimen: Secondary | ICD-10-CM

## 2016-03-15 DIAGNOSIS — I82511 Chronic embolism and thrombosis of right femoral vein: Secondary | ICD-10-CM

## 2016-03-15 DIAGNOSIS — Z7901 Long term (current) use of anticoagulants: Secondary | ICD-10-CM

## 2016-03-15 LAB — POCT INR: INR: 2.2

## 2016-03-15 NOTE — Progress Notes (Signed)
Anticoagulation Management [name] [name] is a 50 y.o. male who reports to the clinic for monitoring of warfarin treatment.    Indication: Chronic venous insufficiency secondary to R DVT, h/o PE, long-term current use of anticoagulants Duration: indefinite  Anticoagulation Clinic Visit History: Patient does not report signs/symptoms of bleeding or thromboembolism. No recent change in diet, medications, lifestyle.  Anticoagulation Episode Summary    Current INR goal:   2.0-3.0  TTR:   22.6 % (11.2 mo)  Next INR check:   03/22/2016  INR from last check:   2.2 (03/15/16)  Weekly max dose:     Target end date:     INR check location:     Preferred lab:     Send INR reminders to:   [facility]   Indications   Right leg DVT ([place]) [I82.401] Long-term (current) use of anticoagulants [Z79.01] DVT (Resolved) [I82.409] Noncompliance with medication regimen [Z91.14]       Comments:         Anticoagulation Care Providers    Provider Role Specialty Phone number   Bartholomew Crews, MD  Internal Medicine [phone]     ASSESSMENT Recent Results: The most recent result is correlated with 100 mg per week: Lab Results  Component Value Date   INR 2.2 03/15/2016   INR 1.5 03/07/2016   INR 1.9 02/29/2016    Anticoagulation Dosing: INR as of 03/15/2016 and Previous Dosing Information    INR Dt INR Goal Wkly Tot Sun Mon Tue Wed Thu Fri Sat     2.0-3.0 100 mg 12.5 mg 15 mg 15 mg 15 mg 15 mg 15 mg 12.5 mg    Anticoagulation Dose Instructions as of 03/15/2016      Total Sun Mon Tue Wed Thu Fri Sat   New Dose 100 mg 12.5 mg 15 mg 15 mg 15 mg 15 mg 15 mg 12.5 mg     (5 mg x 2.5)  (5 mg x 3)  (5 mg x 3)  (5 mg x 3)  (5 mg x 3)  (5 mg x 3)  (5 mg x 2.5)                           INR today: Therapeutic  PLAN Weekly dose was unchanged  There are no Patient Instructions on file for this visit. Patient advised to contact clinic or seek medical attention if signs/symptoms of bleeding  or thromboembolism occur.  Patient verbalized understanding by repeating back information and was advised to contact me if further medication-related questions arise. Patient was also provided an information handout.  Follow-up Patient will f/u in one week (03/22/16) for INR check  Terald Sleeper PharmD Candidate, c/o 2019 03/15/2016 10:50 AM  15 minutes spent face-to-face with the patient during the encounter. 50% of time spent on education. 50% of time was spent on INR and dose adjustment.

## 2016-03-15 NOTE — Progress Notes (Signed)
Patient was seen in clinic with Frederic Jericho, PharmD candidate. I agree with the assessment and plan of care documented.

## 2016-03-15 NOTE — Progress Notes (Signed)
INTERNAL MEDICINE TEACHING ATTENDING ADDENDUM - Starkeisha Vanwinkle M.D  Duration- life long, Indication- recurrent unprovoked DVT, INR- therapeutic. Agree with pharmacy recommendations as outlined in their note.

## 2016-03-22 ENCOUNTER — Other Ambulatory Visit: Payer: Self-pay | Admitting: Pharmacist

## 2016-03-22 ENCOUNTER — Ambulatory Visit (INDEPENDENT_AMBULATORY_CARE_PROVIDER_SITE_OTHER): Payer: Self-pay | Admitting: Pharmacist

## 2016-03-22 DIAGNOSIS — I872 Venous insufficiency (chronic) (peripheral): Secondary | ICD-10-CM

## 2016-03-22 DIAGNOSIS — I82511 Chronic embolism and thrombosis of right femoral vein: Secondary | ICD-10-CM

## 2016-03-22 DIAGNOSIS — Z86711 Personal history of pulmonary embolism: Secondary | ICD-10-CM

## 2016-03-22 DIAGNOSIS — Z7901 Long term (current) use of anticoagulants: Secondary | ICD-10-CM

## 2016-03-22 DIAGNOSIS — Z9114 Patient's other noncompliance with medication regimen: Secondary | ICD-10-CM

## 2016-03-22 DIAGNOSIS — F329 Major depressive disorder, single episode, unspecified: Secondary | ICD-10-CM

## 2016-03-22 DIAGNOSIS — F32A Depression, unspecified: Secondary | ICD-10-CM

## 2016-03-22 LAB — POCT INR: INR: 2.3

## 2016-03-22 MED ORDER — DULOXETINE HCL 60 MG PO CPEP: 60.0000 mg | ORAL_CAPSULE | Freq: Every day | ORAL | 3 refills | Status: DC

## 2016-03-22 MED FILL — DULoxetine HCL 60 MG CPEP: 60 | 30 days supply | Qty: 30 | Fill #0

## 2016-03-22 NOTE — Progress Notes (Signed)
INTERNAL MEDICINE TEACHING ATTENDING ADDENDUM - Jonathin Heinicke M.D  Duration- indefinite, Indication- unprovoked DVT *2, INR- therapeutic. Agree with pharmacy recommendations as outlined in their note.

## 2016-03-22 NOTE — Progress Notes (Signed)
Patient was seen in clinic with Terald Sleeper, PharmD candidate. I agree with the assessment and plan of care documented.

## 2016-03-22 NOTE — Progress Notes (Signed)
Anticoagulation Management [name] [name] is a 50 y.o. male who reports to the clinic for monitoring of warfarin treatment.     Indication: Chronic venous insufficiency secondary to RLE DVT, h/o PE, long-term use of anticoagulants Duration: indefinite  Anticoagulation Clinic Visit History: Patient does not report signs/symptoms of bleeding or thromboembolism Other recent changes: no change in diet, medications, lifestyle Anticoagulation Episode Summary    Current INR goal:   2.0-3.0  TTR:   24.2 % (11.5 mo)  Next INR check:   03/29/2016  INR from last check:   2.3 (03/22/2016)  Weekly max dose:     Target end date:     INR check location:     Preferred lab:     Send INR reminders to:   [facility]   Indications   Right leg DVT ([place]) [I82.401] Long-term (current) use of anticoagulants [Z79.01] DVT (Resolved) [I82.409] Noncompliance with medication regimen [Z91.14]       Comments:         Anticoagulation Care Providers    Provider Role Specialty Phone number   Bartholomew Crews, MD  Internal Medicine [phone]     ASSESSMENT Recent Results: The most recent result is correlated with 100 mg per week: Lab Results  Component Value Date   INR 2.3 03/22/2016   INR 2.2 03/15/2016   INR 1.5 03/07/2016    Anticoagulation Dosing: INR as of 03/22/2016 and Previous Dosing Information    INR Dt INR Goal [name] Sun Mon Tue Wed Thu Fri Sat   03/22/2016 2.3 2.0-3.0 100 mg 12.5 mg 15 mg 15 mg 15 mg 15 mg 15 mg 12.5 mg    Anticoagulation Dose Instructions as of 03/22/2016      Total Sun Mon Tue Wed Thu Fri Sat   New Dose 100 mg 12.5 mg 15 mg 15 mg 15 mg 15 mg 15 mg 12.5 mg     (5 mg x 2.5)  (5 mg x 3)  (5 mg x 3)  (5 mg x 3)  (5 mg x 3)  (5 mg x 3)  (5 mg x 2.5)                           INR today: Therapeutic  PLAN Weekly dose was unchanged.  Pt INR is 2.3 today. Instructed to f/u in 1 week (03/29/16) for INR check. Will begin to check every 2 weeks if stable.  Patient advised to contact clinic or seek medical attention if signs/symptoms of bleeding or thromboembolism occur.  Patient verbalized understanding by repeating back information and was advised to contact me if further medication-related questions arise. Patient was also provided an information handout.  Follow-up F/u in 1 week (03/29/16).  Terald Sleeper PharmD Candidate, c/o 2019 03/22/2016 10:19 AM  15 minutes spent face-to-face with the patient during the encounter. 25% of time spent on education. 75% of time was spent on checking INR and adjusting regimen.

## 2016-03-24 ENCOUNTER — Encounter: Payer: Self-pay | Admitting: Internal Medicine

## 2016-03-29 ENCOUNTER — Ambulatory Visit (INDEPENDENT_AMBULATORY_CARE_PROVIDER_SITE_OTHER): Payer: Self-pay | Admitting: Pharmacist

## 2016-03-29 DIAGNOSIS — Z86711 Personal history of pulmonary embolism: Secondary | ICD-10-CM

## 2016-03-29 DIAGNOSIS — Z7901 Long term (current) use of anticoagulants: Secondary | ICD-10-CM

## 2016-03-29 DIAGNOSIS — I82511 Chronic embolism and thrombosis of right femoral vein: Secondary | ICD-10-CM

## 2016-03-29 DIAGNOSIS — Z9114 Patient's other noncompliance with medication regimen: Secondary | ICD-10-CM

## 2016-03-29 LAB — POCT INR: INR: 3.3

## 2016-03-29 NOTE — Progress Notes (Signed)
INTERNAL MEDICINE TEACHING ATTENDING ADDENDUM - Lalla Brothers M.D  Duration- lifelong, Indication- recurrent VTE, INR- 3.3. Agree with pharmacy recommendations as outlined in their note.

## 2016-03-29 NOTE — Progress Notes (Signed)
Anticoagulation Management [name] [name] is a 50 y.o. male who reports to the clinic for monitoring of warfarin treatment.    Indication: Chronic venous insufficiency secondary to RLE DVT, h/o PE, long-term use of anticoagulants Duration: indefinite  Anticoagulation Clinic Visit History: Patient does not report signs/symptoms of bleeding or thromboembolism  Other recent changes: No recent changes in diet, medications, lifestyle Anticoagulation Episode Summary    Current INR goal:   2.0-3.0  TTR:   25.1 % (11.7 mo)  Next INR check:   04/12/2016  INR from last check:   3.3! (03/29/2016)  Weekly max dose:     Target end date:     INR check location:     Preferred lab:     Send INR reminders to:   [facility]   Indications   Right leg DVT ([place]) [I82.401] Long-term (current) use of anticoagulants [Z79.01] DVT (Resolved) [I82.409] Noncompliance with medication regimen [Z91.14]       Comments:         Anticoagulation Care Providers    Provider Role Specialty Phone number   Bartholomew Crews, MD  Internal Medicine [phone]     ASSESSMENT Recent Results: The most recent result is correlated with 100 mg per week: Lab Results  Component Value Date   INR 3.3 03/29/2016   INR 2.3 03/22/2016   INR 2.2 03/15/2016    Anticoagulation Dosing: INR as of 03/29/2016 and Previous Dosing Information    INR Dt INR Goal [name] Sun Mon Tue Wed Thu Fri Sat   03/29/2016 3.3 2.0-3.0 100 mg 12.5 mg 15 mg 15 mg 15 mg 15 mg 15 mg 12.5 mg    Anticoagulation Dose Instructions as of 03/29/2016      Total Sun Mon Tue Wed Thu Fri Sat   New Dose 100 mg 12.5 mg 15 mg 15 mg 15 mg 15 mg 15 mg 12.5 mg     (5 mg x 2.5)  (5 mg x 3)  (5 mg x 3)  (5 mg x 3)  (5 mg x 3)  (5 mg x 3)  (5 mg x 2.5)                           INR today: Supratherapeutic  PLAN Weekly dose was unchanged.  Pt INR is slightly supratherapeutic at 3.3 today. Since within 0.5 of goal, will continue current dose. Last  two INRs have been therapeutic. Instructed pt to follow up in 2 weeks. Patient advised to contact clinic or seek medical attention if signs/symptoms of bleeding or thromboembolism occur.  Patient verbalized understanding by repeating back information and was advised to contact me if further medication-related questions arise. Patient was also provided an information handout.  Follow-up F/u in 2 weeks for INR check.  Terald Sleeper PharmD Candidate, c/o 2019 03/29/2016 10:08 AM  10 minutes spent face-to-face with the patient during the encounter. 25% of time spent on education. 75% of time was spent on INR check and adjustment.

## 2016-03-29 NOTE — Progress Notes (Signed)
Patient was seen in clinic with Terald Sleeper, PharmD candidate. I agree with the assessment and plan of care documented.  Continuing current dose due to history of subtherapeutic INRs and result today being within 0.5 of the target range. Follow up in 2 weeks or sooner if any symptoms/concerns arise.

## 2016-03-30 ENCOUNTER — Emergency Department (HOSPITAL_COMMUNITY): Payer: Self-pay

## 2016-03-30 ENCOUNTER — Encounter (HOSPITAL_COMMUNITY): Payer: Self-pay | Admitting: Emergency Medicine

## 2016-03-30 DIAGNOSIS — R0602 Shortness of breath: Secondary | ICD-10-CM | POA: Insufficient documentation

## 2016-03-30 DIAGNOSIS — Z5321 Procedure and treatment not carried out due to patient leaving prior to being seen by health care provider: Secondary | ICD-10-CM | POA: Insufficient documentation

## 2016-03-30 DIAGNOSIS — Z7901 Long term (current) use of anticoagulants: Secondary | ICD-10-CM | POA: Insufficient documentation

## 2016-03-30 DIAGNOSIS — F17210 Nicotine dependence, cigarettes, uncomplicated: Secondary | ICD-10-CM | POA: Insufficient documentation

## 2016-03-30 DIAGNOSIS — J45909 Unspecified asthma, uncomplicated: Secondary | ICD-10-CM | POA: Insufficient documentation

## 2016-03-30 DIAGNOSIS — R079 Chest pain, unspecified: Secondary | ICD-10-CM | POA: Insufficient documentation

## 2016-03-30 LAB — BASIC METABOLIC PANEL
Anion gap: 5 (ref 5–15)
BUN: 9 mg/dL (ref 6–20)
CHLORIDE: 106 mmol/L (ref 101–111)
CO2: 29 mmol/L (ref 22–32)
CREATININE: 1.11 mg/dL (ref 0.61–1.24)
Calcium: 9 mg/dL (ref 8.9–10.3)
GFR calc non Af Amer: >60 mL/min (ref >60)
GLUCOSE: 119 mg/dL — AB (ref 65–99)
Potassium: 3.8 mmol/L (ref 3.5–5.1)
Sodium: 140 mmol/L (ref 135–145)

## 2016-03-30 LAB — I-STAT TROPONIN, ED: Troponin i, poc: 0 ng/mL (ref 0.00–0.08)

## 2016-03-30 LAB — CBC
HCT: 47 % (ref 39.0–52.0)
Hemoglobin: 15.6 g/dL (ref 13.0–17.0)
MCH: 32.1 pg (ref 26.0–34.0)
MCHC: 33.2 g/dL (ref 30.0–36.0)
MCV: 96.7 fL (ref 78.0–100.0)
PLATELETS: 189 10*3/uL (ref 150–400)
RBC: 4.86 MIL/uL (ref 4.22–5.81)
RDW: 13.7 % (ref 11.5–15.5)
WBC: 4.5 10*3/uL (ref 4.0–10.5)

## 2016-03-30 MED ORDER — ALBUTEROL SULFATE (2.5 MG/3ML) 0.083% IN NEBU
INHALATION_SOLUTION | RESPIRATORY_TRACT | Status: AC
  Filled 2016-03-30: qty 6

## 2016-03-30 MED ORDER — ALBUTEROL SULFATE (2.5 MG/3ML) 0.083% IN NEBU
5.0000 mg | INHALATION_SOLUTION | Freq: Once | RESPIRATORY_TRACT | Status: AC
  Administered 2016-03-30: 5 mg via RESPIRATORY_TRACT

## 2016-03-30 NOTE — ED Notes (Signed)
Pt called for in waiting area for vital signs. No answer

## 2016-03-30 NOTE — ED Triage Notes (Signed)
Pt here with SOB that he sts is related to his asthma since this morning. Pt sts no relief with home meds. Pt also reports non productive cough and right sided chest pain. resp e/u at this time.

## 2016-03-31 ENCOUNTER — Emergency Department (HOSPITAL_COMMUNITY)
Admission: EM | Admit: 2016-03-31 | Discharge: 2016-03-31 | Disposition: A | Discharge status: Home / Self Care | Payer: Self-pay | Attending: Emergency Medicine | Admitting: Emergency Medicine

## 2016-03-31 NOTE — ED Notes (Signed)
Pt called for in waiting area for vital signs. No answer

## 2016-04-12 ENCOUNTER — Ambulatory Visit (INDEPENDENT_AMBULATORY_CARE_PROVIDER_SITE_OTHER): Payer: Self-pay | Admitting: Pharmacist

## 2016-04-12 ENCOUNTER — Other Ambulatory Visit: Payer: Self-pay | Admitting: Pharmacist

## 2016-04-12 DIAGNOSIS — Z9114 Patient's other noncompliance with medication regimen: Secondary | ICD-10-CM

## 2016-04-12 DIAGNOSIS — I82511 Chronic embolism and thrombosis of right femoral vein: Secondary | ICD-10-CM

## 2016-04-12 DIAGNOSIS — Z7901 Long term (current) use of anticoagulants: Secondary | ICD-10-CM

## 2016-04-12 DIAGNOSIS — G4709 Other insomnia: Secondary | ICD-10-CM

## 2016-04-12 DIAGNOSIS — I2699 Other pulmonary embolism without acute cor pulmonale: Secondary | ICD-10-CM

## 2016-04-12 LAB — POCT INR: INR: 2.4

## 2016-04-12 MED ORDER — WARFARIN SODIUM 5 MG PO TABS: ORAL_TABLET | ORAL | 3 refills | Status: DC

## 2016-04-12 MED FILL — WARFARIN SODIUM 5 MG TABLET: 5 | 28 days supply | Qty: 80 | Fill #0

## 2016-04-12 NOTE — Telephone Encounter (Signed)
error 

## 2016-04-12 NOTE — Progress Notes (Signed)
Indication: Recurrent unprovoked deep venous thromboses. Duration: Indefinite. INR: At target. Agree with Dr. Julianne Rice and Mr. Tillman's assessment and plan.

## 2016-04-12 NOTE — Progress Notes (Signed)
Anticoagulation Management [name] [name] is a 50 y.o. male who reports to the clinic for monitoring of warfarin treatment.     Indication: DVT and PE history Duration: indefinite  Anticoagulation Clinic Visit History: Patient does not report signs/symptoms of bleeding but does report right leg swelling and pain that radiates from back. Pt reports no calf pain. Referred to pcp for follow up and patient verbalized understanding.   Other recent changes: No changes to diet, medications, lifestyle Anticoagulation Episode Summary    Current INR goal:   2.0-3.0  TTR:   26.7 % (1 y)  Next INR check:   05/03/2016  INR from last check:   2.4 (04/12/2016)  Weekly max dose:     Target end date:     INR check location:     Preferred lab:     Send INR reminders to:   [facility]   Indications   Right leg DVT ([place]) [I82.401] Long-term (current) use of anticoagulants [Z79.01] DVT (Resolved) [I82.409] Noncompliance with medication regimen [Z91.14]       Comments:         Anticoagulation Care Providers    Provider Role Specialty Phone number   Bartholomew Crews, MD  Internal Medicine [phone]     ASSESSMENT Recent Results: The most recent result is correlated with 100 mg per week: Lab Results  Component Value Date   INR 2.4 04/12/2016   INR 3.3 03/29/2016   INR 2.3 03/22/2016    Anticoagulation Dosing: INR as of 04/12/2016 and Previous Dosing Information    INR Dt INR Goal [name] Sun Mon Tue Wed Thu Fri Sat   04/12/2016 2.4 2.0-3.0 100 mg 12.5 mg 15 mg 15 mg 15 mg 15 mg 15 mg 12.5 mg    Anticoagulation Dose Instructions as of 04/12/2016      Total Sun Mon Tue Wed Thu Fri Sat   New Dose 100 mg 12.5 mg 15 mg 15 mg 15 mg 15 mg 15 mg 12.5 mg     (5 mg x 2.5)  (5 mg x 3)  (5 mg x 3)  (5 mg x 3)  (5 mg x 3)  (5 mg x 3)  (5 mg x 2.5)                           INR today: Therapeutic  PLAN Weekly dose was unchanged and maintained at 100mg   per week  Patient  Instructions  Patient educated about medication as defined in this encounter and verbalized understanding by repeating back instructions provided.   Patient advised to contact clinic or seek medical attention if signs/symptoms of bleeding or thromboembolism occur.  Patient verbalized understanding by repeating back information and was advised to contact me if further medication-related questions arise. Patient was also provided an information handout.  Follow-up 3 weeks.   Darcella Cheshire  20 minutes spent face-to-face with the patient during the encounter. 50% of time spent on education. 50% of time was spent on assessment/plan and coordination of care.

## 2016-04-12 NOTE — Patient Instructions (Signed)
Patient educated about medication as defined in this encounter and verbalized understanding by repeating back instructions provided.   

## 2016-04-12 NOTE — Progress Notes (Signed)
Patient was seen in clinic with [name] [name], PharmD candidate. I agree with the assessment and plan of care documented. Warfarin prescription sent to pharmacy for further refills.

## 2016-05-01 ENCOUNTER — Other Ambulatory Visit: Payer: Self-pay | Admitting: Pharmacist

## 2016-05-01 DIAGNOSIS — F339 Major depressive disorder, recurrent, unspecified: Secondary | ICD-10-CM

## 2016-05-01 DIAGNOSIS — J45909 Unspecified asthma, uncomplicated: Secondary | ICD-10-CM

## 2016-05-01 DIAGNOSIS — I872 Venous insufficiency (chronic) (peripheral): Secondary | ICD-10-CM

## 2016-05-01 DIAGNOSIS — M792 Neuralgia and neuritis, unspecified: Secondary | ICD-10-CM

## 2016-05-01 DIAGNOSIS — Z7901 Long term (current) use of anticoagulants: Secondary | ICD-10-CM

## 2016-05-01 DIAGNOSIS — G4700 Insomnia, unspecified: Secondary | ICD-10-CM

## 2016-05-01 DIAGNOSIS — I2699 Other pulmonary embolism without acute cor pulmonale: Secondary | ICD-10-CM

## 2016-05-01 DIAGNOSIS — I82511 Chronic embolism and thrombosis of right femoral vein: Secondary | ICD-10-CM

## 2016-05-01 MED ORDER — FLUTICASONE-SALMETEROL 500-50 MCG/DOSE IN AEPB: 1.0000 | INHALATION_SPRAY | Freq: Two times a day (BID) | RESPIRATORY_TRACT | 6 refills | Status: DC

## 2016-05-01 MED ORDER — FUROSEMIDE 40 MG PO TABS: 40.0000 mg | ORAL_TABLET | Freq: Every day | ORAL | 0 refills | Status: DC

## 2016-05-01 MED ORDER — DULOXETINE HCL 60 MG PO CPEP: 60.0000 mg | ORAL_CAPSULE | Freq: Every day | ORAL | 3 refills | Status: DC

## 2016-05-01 MED ORDER — ALBUTEROL SULFATE HFA 108 (90 BASE) MCG/ACT IN AERS: 2.0000 | INHALATION_SPRAY | Freq: Four times a day (QID) | RESPIRATORY_TRACT | 3 refills | Status: DC | PRN

## 2016-05-01 MED ORDER — GABAPENTIN 300 MG PO CAPS: 600.0000 mg | ORAL_CAPSULE | Freq: Three times a day (TID) | ORAL | 0 refills | Status: DC

## 2016-05-01 MED ORDER — TRAZODONE HCL 100 MG PO TABS: 100.0000 mg | ORAL_TABLET | Freq: Every evening | ORAL | 0 refills | Status: DC | PRN

## 2016-05-01 MED ORDER — WARFARIN SODIUM 5 MG PO TABS: ORAL_TABLET | ORAL | 3 refills | Status: DC

## 2016-05-01 MED ORDER — ALBUTEROL SULFATE (2.5 MG/3ML) 0.083% IN NEBU: 5.0000 mg | INHALATION_SOLUTION | Freq: Four times a day (QID) | RESPIRATORY_TRACT | 0 refills | Status: DC | PRN

## 2016-05-01 NOTE — Progress Notes (Signed)
Patient approved for [facility] pharmacy, prescriptions transferred.

## 2016-05-03 ENCOUNTER — Ambulatory Visit: Payer: Self-pay | Admitting: Pharmacist

## 2016-05-04 ENCOUNTER — Telehealth: Payer: Self-pay | Admitting: Internal Medicine

## 2016-05-04 MED ORDER — ALBUTEROL SULFATE (2.5 MG/3ML) 0.083% IN NEBU: 2.5000 mg | INHALATION_SOLUTION | Freq: Four times a day (QID) | RESPIRATORY_TRACT | 0 refills | Status: DC | PRN

## 2016-05-04 NOTE — Addendum Note (Signed)
Addended by: Forde Dandy on: 05/04/2016 02:07 PM   Modules accepted: Orders

## 2016-05-04 NOTE — Telephone Encounter (Signed)
APT. REMINDER CALL, LMTCB °

## 2016-05-05 ENCOUNTER — Ambulatory Visit: Payer: Self-pay

## 2016-05-19 ENCOUNTER — Telehealth: Payer: Self-pay | Admitting: Internal Medicine

## 2016-05-19 NOTE — Telephone Encounter (Signed)
APT. REMINDER CALL, LMTCB °

## 2016-05-22 ENCOUNTER — Encounter: Payer: Self-pay | Admitting: Internal Medicine

## 2016-05-26 ENCOUNTER — Observation Stay (HOSPITAL_COMMUNITY)
Admission: EM | Admit: 2016-05-26 | Discharge: 2016-05-27 | Disposition: A | Discharge status: Home / Self Care | Payer: Self-pay | Attending: Student in an Organized Health Care Education/Training Program | Admitting: Student in an Organized Health Care Education/Training Program

## 2016-05-26 ENCOUNTER — Other Ambulatory Visit: Payer: Self-pay

## 2016-05-26 ENCOUNTER — Encounter (HOSPITAL_COMMUNITY): Payer: Self-pay

## 2016-05-26 ENCOUNTER — Emergency Department (HOSPITAL_COMMUNITY): Payer: Self-pay

## 2016-05-26 ENCOUNTER — Emergency Department (HOSPITAL_BASED_OUTPATIENT_CLINIC_OR_DEPARTMENT_OTHER): Admit: 2016-05-26 | Discharge: 2016-05-26 | Disposition: A | Discharge status: Home / Self Care | Payer: Self-pay

## 2016-05-26 DIAGNOSIS — Z7901 Long term (current) use of anticoagulants: Secondary | ICD-10-CM | POA: Insufficient documentation

## 2016-05-26 DIAGNOSIS — I5032 Chronic diastolic (congestive) heart failure: Secondary | ICD-10-CM | POA: Insufficient documentation

## 2016-05-26 DIAGNOSIS — J45901 Unspecified asthma with (acute) exacerbation: Principal | ICD-10-CM | POA: Insufficient documentation

## 2016-05-26 DIAGNOSIS — F17210 Nicotine dependence, cigarettes, uncomplicated: Secondary | ICD-10-CM | POA: Insufficient documentation

## 2016-05-26 DIAGNOSIS — Z79899 Other long term (current) drug therapy: Secondary | ICD-10-CM | POA: Insufficient documentation

## 2016-05-26 DIAGNOSIS — F319 Bipolar disorder, unspecified: Secondary | ICD-10-CM | POA: Diagnosis present

## 2016-05-26 DIAGNOSIS — Z86718 Personal history of other venous thrombosis and embolism: Secondary | ICD-10-CM

## 2016-05-26 DIAGNOSIS — I82501 Chronic embolism and thrombosis of unspecified deep veins of right lower extremity: Secondary | ICD-10-CM | POA: Diagnosis present

## 2016-05-26 DIAGNOSIS — F17200 Nicotine dependence, unspecified, uncomplicated: Secondary | ICD-10-CM | POA: Diagnosis present

## 2016-05-26 DIAGNOSIS — J441 Chronic obstructive pulmonary disease with (acute) exacerbation: Secondary | ICD-10-CM

## 2016-05-26 DIAGNOSIS — J4540 Moderate persistent asthma, uncomplicated: Secondary | ICD-10-CM | POA: Diagnosis present

## 2016-05-26 LAB — I-STAT TROPONIN, ED: Troponin i, poc: 0 ng/mL (ref 0.00–0.08)

## 2016-05-26 LAB — CBC WITH DIFFERENTIAL/PLATELET
BASOS ABS: 0 10*3/uL (ref 0.0–0.1)
BASOS PCT: 1 %
Eosinophils Absolute: 0.1 10*3/uL (ref 0.0–0.7)
Eosinophils Relative: 1 %
HEMATOCRIT: 47.3 % (ref 39.0–52.0)
HEMOGLOBIN: 16 g/dL (ref 13.0–17.0)
LYMPHS PCT: 27 %
Lymphs Abs: 1.7 10*3/uL (ref 0.7–4.0)
MCH: 31.6 pg (ref 26.0–34.0)
MCHC: 33.8 g/dL (ref 30.0–36.0)
MCV: 93.5 fL (ref 78.0–100.0)
MONOS PCT: 15 %
Monocytes Absolute: 0.9 10*3/uL (ref 0.1–1.0)
NEUTROS ABS: 3.5 10*3/uL (ref 1.7–7.7)
Neutrophils Relative %: 56 %
Platelets: 187 10*3/uL (ref 150–400)
RBC: 5.06 MIL/uL (ref 4.22–5.81)
RDW: 13.8 % (ref 11.5–15.5)
WBC: 6.1 10*3/uL (ref 4.0–10.5)

## 2016-05-26 LAB — PROTIME-INR
INR: 2.34
Prothrombin Time: 26 seconds — ABNORMAL HIGH (ref 11.4–15.2)

## 2016-05-26 LAB — BASIC METABOLIC PANEL
Anion gap: 8 (ref 5–15)
BUN: 6 mg/dL (ref 6–20)
CO2: 27 mmol/L (ref 22–32)
Calcium: 9.1 mg/dL (ref 8.9–10.3)
Chloride: 102 mmol/L (ref 101–111)
Creatinine, Ser: 1.05 mg/dL (ref 0.61–1.24)
GFR calc Af Amer: >60 mL/min (ref >60)
GFR calc non Af Amer: >60 mL/min (ref >60)
Glucose, Bld: 101 mg/dL — ABNORMAL HIGH (ref 65–99)
Potassium: 3.6 mmol/L (ref 3.5–5.1)
Sodium: 137 mmol/L (ref 135–145)

## 2016-05-26 LAB — BRAIN NATRIURETIC PEPTIDE: B Natriuretic Peptide: 8.7 pg/mL (ref 0.0–100.0)

## 2016-05-26 MED ORDER — ALBUTEROL (5 MG/ML) CONTINUOUS INHALATION SOLN
10.0000 mg/h | INHALATION_SOLUTION | RESPIRATORY_TRACT | Status: AC
  Administered 2016-05-26: 10 mg/h via RESPIRATORY_TRACT
  Filled 2016-05-26: qty 80

## 2016-05-26 MED ORDER — ALBUTEROL SULFATE (2.5 MG/3ML) 0.083% IN NEBU
INHALATION_SOLUTION | RESPIRATORY_TRACT | Status: AC
Start: 2016-05-26 — End: 2016-05-26
  Administered 2016-05-26: 16:00:00
  Filled 2016-05-26: qty 6

## 2016-05-26 MED ORDER — AZITHROMYCIN 250 MG PO TABS
500.0000 mg | ORAL_TABLET | Freq: Once | ORAL | Status: AC
Start: 2016-05-26 — End: 2016-05-26
  Administered 2016-05-26: 500 mg via ORAL
  Filled 2016-05-26: qty 2

## 2016-05-26 MED ORDER — DEXAMETHASONE SODIUM PHOSPHATE 10 MG/ML IJ SOLN: 10.0000 mg | Freq: Once | INTRAMUSCULAR | Status: DC

## 2016-05-26 MED ORDER — DEXAMETHASONE SODIUM PHOSPHATE 10 MG/ML IJ SOLN
10.0000 mg | Freq: Once | INTRAMUSCULAR | Status: AC
  Administered 2016-05-26: 10 mg via INTRAMUSCULAR
  Filled 2016-05-26: qty 1

## 2016-05-26 MED ORDER — MAGNESIUM SULFATE 2 GM/50ML IV SOLN
2.0000 g | Freq: Once | INTRAVENOUS | Status: AC
  Administered 2016-05-26: 2 g via INTRAVENOUS
  Filled 2016-05-26: qty 50

## 2016-05-26 MED ORDER — IPRATROPIUM-ALBUTEROL 0.5-2.5 (3) MG/3ML IN SOLN
3.0000 mL | Freq: Once | RESPIRATORY_TRACT | Status: AC
  Administered 2016-05-26: 3 mL via RESPIRATORY_TRACT
  Filled 2016-05-26: qty 3

## 2016-05-26 MED ORDER — ALBUTEROL SULFATE (2.5 MG/3ML) 0.083% IN NEBU
5.0000 mg | INHALATION_SOLUTION | Freq: Once | RESPIRATORY_TRACT | Status: AC
  Administered 2016-05-26: 5 mg via RESPIRATORY_TRACT

## 2016-05-26 NOTE — ED Notes (Signed)
Pt transported to US

## 2016-05-26 NOTE — Progress Notes (Signed)
*  PRELIMINARY RESULTS* Vascular Ultrasound Lower extremity venous duplex has been completed.  Preliminary findings: Chronic DVT noted in the Right mid femoral vein and Right popliteal vein. (Consistent with previous studies). No evidence of Acute DVT RLE. No evidence of acute or chronic DVT LLE.    Landry Mellow, RDMS, RVT  05/26/2016, 6:28 PM
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
## 2016-05-26 NOTE — ED Notes (Signed)
Pt states he isn't feeling any better, Gay Filler notified

## 2016-05-27 DIAGNOSIS — Z79899 Other long term (current) drug therapy: Secondary | ICD-10-CM

## 2016-05-27 DIAGNOSIS — Z86711 Personal history of pulmonary embolism: Secondary | ICD-10-CM

## 2016-05-27 DIAGNOSIS — F17210 Nicotine dependence, cigarettes, uncomplicated: Secondary | ICD-10-CM

## 2016-05-27 DIAGNOSIS — J441 Chronic obstructive pulmonary disease with (acute) exacerbation: Secondary | ICD-10-CM

## 2016-05-27 DIAGNOSIS — F329 Major depressive disorder, single episode, unspecified: Secondary | ICD-10-CM

## 2016-05-27 DIAGNOSIS — Z825 Family history of asthma and other chronic lower respiratory diseases: Secondary | ICD-10-CM

## 2016-05-27 DIAGNOSIS — Z800 Family history of malignant neoplasm of digestive organs: Secondary | ICD-10-CM

## 2016-05-27 DIAGNOSIS — I82501 Chronic embolism and thrombosis of unspecified deep veins of right lower extremity: Secondary | ICD-10-CM

## 2016-05-27 DIAGNOSIS — I5032 Chronic diastolic (congestive) heart failure: Secondary | ICD-10-CM

## 2016-05-27 DIAGNOSIS — Z7901 Long term (current) use of anticoagulants: Secondary | ICD-10-CM

## 2016-05-27 DIAGNOSIS — Z881 Allergy status to other antibiotic agents status: Secondary | ICD-10-CM

## 2016-05-27 DIAGNOSIS — J45901 Unspecified asthma with (acute) exacerbation: Secondary | ICD-10-CM

## 2016-05-27 DIAGNOSIS — Z888 Allergy status to other drugs, medicaments and biological substances status: Secondary | ICD-10-CM

## 2016-05-27 LAB — BASIC METABOLIC PANEL
ANION GAP: 10 (ref 5–15)
BUN: 9 mg/dL (ref 6–20)
CALCIUM: 9.2 mg/dL (ref 8.9–10.3)
CO2: 23 mmol/L (ref 22–32)
Chloride: 103 mmol/L (ref 101–111)
Creatinine, Ser: 1.08 mg/dL (ref 0.61–1.24)
Glucose, Bld: 154 mg/dL — ABNORMAL HIGH (ref 65–99)
POTASSIUM: 4.6 mmol/L (ref 3.5–5.1)
Sodium: 136 mmol/L (ref 135–145)

## 2016-05-27 LAB — CBC
HCT: 46.7 % (ref 39.0–52.0)
HEMOGLOBIN: 15.8 g/dL (ref 13.0–17.0)
MCH: 31.7 pg (ref 26.0–34.0)
MCHC: 33.8 g/dL (ref 30.0–36.0)
MCV: 93.6 fL (ref 78.0–100.0)
Platelets: 210 10*3/uL (ref 150–400)
RBC: 4.99 MIL/uL (ref 4.22–5.81)
RDW: 13.8 % (ref 11.5–15.5)
WBC: 7.7 10*3/uL (ref 4.0–10.5)

## 2016-05-27 LAB — PROTIME-INR
INR: 2.29
PROTHROMBIN TIME: 25.6 s — AB (ref 11.4–15.2)

## 2016-05-27 LAB — INFLUENZA PANEL BY PCR (TYPE A & B)
INFLAPCR: NEGATIVE
INFLBPCR: NEGATIVE

## 2016-05-27 MED ORDER — IPRATROPIUM-ALBUTEROL 0.5-2.5 (3) MG/3ML IN SOLN: 3.0000 mL | RESPIRATORY_TRACT | Status: DC

## 2016-05-27 MED ORDER — INFLUENZA VAC SPLIT QUAD 0.5 ML IM SUSY: 0.5000 mL | PREFILLED_SYRINGE | INTRAMUSCULAR | Status: DC

## 2016-05-27 MED ORDER — WARFARIN SODIUM 2.5 MG PO TABS: 12.5000 mg | ORAL_TABLET | ORAL | Status: DC

## 2016-05-27 MED ORDER — PREDNISONE 20 MG PO TABS
40.0000 mg | ORAL_TABLET | Freq: Every day | ORAL | Status: DC
  Administered 2016-05-27: 40 mg via ORAL
  Filled 2016-05-27: qty 2

## 2016-05-27 MED ORDER — ALBUTEROL SULFATE (2.5 MG/3ML) 0.083% IN NEBU: 2.5000 mg | INHALATION_SOLUTION | Freq: Four times a day (QID) | RESPIRATORY_TRACT | Status: DC | PRN

## 2016-05-27 MED ORDER — WARFARIN SODIUM 7.5 MG PO TABS
15.0000 mg | ORAL_TABLET | ORAL | Status: DC
  Administered 2016-05-27: 15 mg via ORAL
  Filled 2016-05-27: qty 2

## 2016-05-27 MED ORDER — SODIUM CHLORIDE 0.9% FLUSH: 3.0000 mL | Freq: Two times a day (BID) | INTRAVENOUS | Status: DC

## 2016-05-27 MED ORDER — GABAPENTIN 300 MG PO CAPS
600.0000 mg | ORAL_CAPSULE | Freq: Three times a day (TID) | ORAL | Status: DC
  Filled 2016-05-27: qty 2

## 2016-05-27 MED ORDER — AZITHROMYCIN 250 MG PO TABS: 250.0000 mg | ORAL_TABLET | Freq: Every day | ORAL | 0 refills | Status: DC

## 2016-05-27 MED ORDER — AZITHROMYCIN 500 MG PO TABS
250.0000 mg | ORAL_TABLET | Freq: Every day | ORAL | Status: DC
  Filled 2016-05-27: qty 1

## 2016-05-27 MED ORDER — AZITHROMYCIN 250 MG PO TABS
250.0000 mg | ORAL_TABLET | Freq: Every day | ORAL | 0 refills | Status: DC
Start: 2016-05-27 — End: 2016-05-27

## 2016-05-27 MED ORDER — PREDNISONE 20 MG PO TABS: 40.0000 mg | ORAL_TABLET | Freq: Every day | ORAL | 0 refills | Status: AC

## 2016-05-27 MED ORDER — DULOXETINE HCL 60 MG PO CPEP
60.0000 mg | ORAL_CAPSULE | Freq: Every day | ORAL | Status: DC
  Administered 2016-05-27: 60 mg via ORAL
  Filled 2016-05-27: qty 1

## 2016-05-27 MED ORDER — IPRATROPIUM-ALBUTEROL 0.5-2.5 (3) MG/3ML IN SOLN
3.0000 mL | Freq: Four times a day (QID) | RESPIRATORY_TRACT | Status: DC
  Administered 2016-05-27: 3 mL via RESPIRATORY_TRACT
  Filled 2016-05-27: qty 3

## 2016-05-27 MED ORDER — TRAZODONE HCL 100 MG PO TABS
100.0000 mg | ORAL_TABLET | Freq: Every evening | ORAL | Status: DC | PRN
  Administered 2016-05-27: 100 mg via ORAL
  Filled 2016-05-27: qty 1

## 2016-05-27 MED ORDER — FUROSEMIDE 40 MG PO TABS
40.0000 mg | ORAL_TABLET | Freq: Every day | ORAL | Status: DC
  Filled 2016-05-27: qty 1

## 2016-05-27 MED ORDER — PREDNISONE 20 MG PO TABS: 40.0000 mg | ORAL_TABLET | Freq: Every day | ORAL | 0 refills | Status: DC

## 2016-05-27 MED ORDER — WARFARIN - PHARMACIST DOSING INPATIENT: Freq: Every day | Status: DC

## 2016-05-27 MED ORDER — MOMETASONE FURO-FORMOTEROL FUM 200-5 MCG/ACT IN AERO
2.0000 | INHALATION_SPRAY | Freq: Two times a day (BID) | RESPIRATORY_TRACT | Status: DC
  Administered 2016-05-27: 2 via RESPIRATORY_TRACT
  Filled 2016-05-27: qty 8.8

## 2016-05-27 NOTE — H&P (Signed)
Date: 05/27/2016               Patient Name:  [name] [name] MRN: [id number]  DOB: 1965/09/23 Age / Sex: 50 y.o., male   PCP: Dellia Nims, MD         Medical Service: Internal Medicine Teaching Service         Attending Physician: Dr. Axel Filler, MD    First Contact: Dr. Marjory Sneddon, MD Pager: [phone]  Second Contact: Dr. Ignacia Marvel, MD Pager: [phone]       After Hours (After 5p/  First Contact Pager: [phone]  weekends / holidays): Second Contact Pager: [phone]   Chief Complaint: "asthma and bronchitis are acting up"  History of Present Illness:  This is a 50 y/o M with MHx significant for COPD, asthma, chronic LE DVT on Coumadin, CHF (echo 5/16: 50-55%) and tobacco abuse who presents for evaluation of a 2 day history of worsening wheezing and SOB. In addition to those complaints he reports increased coughing with increased sputum production. Reports his sputum is currently green vs his usual clear sputum. Also reports he has been having to use his albuterol rescue inhaler several times a day without relief. Reports he typically uses it 2x/week maximum. Reports he as never been admitted for his COPD or asthma before and believes his last exacerbation of either was 3 years ago.  Endorses watery eyes, myalgias, runny nose and sinus congestion as well. Reports he has allergies however only takes Claritin as needed. Does not wear oxygen at home. Denies any sick contacts. Reports he's had PFTs before however they have been somehow lost in the system.   In the ED vital signs were stable (Temp 97.7*, pulse 91, BP 121/78, respirations 20 and he was saturating 94% on RA). CXR showed atelectasis in both lung bases without other significant abnormality. CBC and CMP normal. He received IV Magnesium, Duoneb and albuterol treatments as well as 10 mg Decadron with improvement of his symptoms. Reports his airways feel significantly more open and is breathing better.   Meds:  Current  Meds  Medication Sig  . albuterol (PROVENTIL HFA;VENTOLIN HFA) 108 (90 Base) MCG/ACT inhaler Inhale 2 puffs into the lungs every 6 (six) hours as needed for wheezing or shortness of breath.  . diclofenac sodium (VOLTAREN) 1 % GEL Apply 2 g topically 4 (four) times daily. (Patient taking differently: Apply 2 g topically 4 (four) times daily as needed (pain). )  . DULoxetine (CYMBALTA) 60 MG capsule Take 1 capsule (60 mg total) by mouth daily. IM program  . Fluticasone-Salmeterol (ADVAIR DISKUS) 500-50 MCG/DOSE AEPB Inhale 1 puff into the lungs 2 (two) times daily.  . furosemide (LASIX) 40 MG tablet Take 1 tablet (40 mg total) by mouth daily.  Marland Kitchen gabapentin (NEURONTIN) 300 MG capsule Take 2 capsules (600 mg total) by mouth 3 (three) times daily. (Patient taking differently: Take 900 mg by mouth 3 (three) times daily. )  . hydrocerin (EUCERIN) CREA Apply 1 application topically daily.  . nicotine (NICODERM CQ - DOSED IN MG/24 HOURS) 14 mg/24hr patch Place 1 patch (14 mg total) onto the skin daily.  . traZODone (DESYREL) 100 MG tablet Take 1 tablet (100 mg total) by mouth at bedtime as needed for sleep.  Marland Kitchen warfarin (COUMADIN) 5 MG tablet Take 3 tablets (15mg ) by mouth Monday-Friday and 2.5 tablets (12.5mg ) Sat-Sun.   Allergies: Allergies as of 05/26/2016 - Review Complete 05/26/2016  Allergen Reaction Noted  . Xarelto [  rivaroxaban] Dermatitis 09/28/2014  . Clindamycin/lincomycin Dermatitis 07/22/2014   Past Medical History:  Diagnosis Date  . Alcohol abuse   . Arthritis    knees  . Asthma    uses Albuterol daily as needed  . Bipolar 1 disorder ([place])   . Bronchitis    last time 2 yrs ago  . Cellulitis   . Chronic dermatitis    due to Xarelto Rx  . Cough    smokers  . Depression   . DVT (deep venous thrombosis) (HCC)    on Coumadin Rx  . DVT (deep venous thrombosis) ([place])   . GERD (gastroesophageal reflux disease)    only takes something about 2 times a yr  . GERD (gastroesophageal  reflux disease)   . Heart murmur   . Joint pain   . Pulmonary embolism ([place]) 2014  . Shortness of breath   . Substance abuse    crack, cocaine, last use 2007  . Tobacco abuse   . Tuberculosis    ' I test Positive "   Family History:  Mother: Asthma Father: Deceased, throat cancer  Social History: Smokes 1/2 pk per day x "many years." Denied any alcohol use or recent recreational drug use. Currently does not work.  Review of Systems: A complete ROS was negative except as per HPI.   Physical Exam: Blood pressure 121/78, pulse 91, temperature 97.7 F (36.5 C), temperature source Oral, resp. rate 20, height 6\' 4"  (1.93 m), weight 250 lb (113.4 kg), SpO2 97 %. General: Middle-aged african [place] male resting comfortably in bed. In no acute distress. Breathing unlabored. Pleasant.  HENT: EOMI. Sclera without icterus. No injections or exudate noted. Oropharynx clear, mucous membranes moist.  Cardiovascular: RRR, no murmur or rub appreciated.  Pulmonary: Mild moderate insp and exp wheezes present diffusely. No crackles. Breathing unlabored. Able to speak without becoming SOB.  Abdomen: Soft, non-tender and not distended. No guarding or rigidity. +bowel sounds Extremities: R LE larger than left. Non pitting edema of RLE. BL LE warm and dry. Skin changes on BL LE consistent with chronic venous stasis  EKG: NSR, rate 76. Abnormal R-wave progression. No significant change since prior EKG 03/31/16  CXR: Linear opacities favoring subsegmental atelectasis in both lung bases. No significant abnormalities noted.   Assessment & Plan by Problem: Active Problems:   Major depressive disorder, recurrent episode, severe (HCC)   Asthma, chronic   Long-term (current) use of anticoagulants   Right leg DVT (HCC)   Tobacco abuse   COPD exacerbation (HCC)  COPD Exacerbation, Chronic Asthma Patient here with all 3 cardinal signs of COPD exacerbation: increased sputum production, change in color of  sputum as well as worsened dyspnea. Most recent COPD exacerbation 3 yrs ago and he has never required hospitalization for this. He was given IV Mag, IV decadron 10 mg, a duoneb treatment and albuterol treatment with significant improvement of his symptoms. Pt reportedly told ED physician that these measures did not seem to help however he reports to me that he feels much better.  -Will treat COPD Exacerbation with a course of Azithromycin. Received 500 mg in ED and will continue this at 250 mg daily. -Duobs, Proventil and Dulera.  -Prednisone 40mg  daily -Repeat CBC and BMET in AM -Flu panel negative -Flu shot -On tele: had short run of non-sustained asymptomatic VTACH in ED  Chronic Right LE DVT, on Anticoagulation. Hx of PE First documented in our EMR on doppler 08/2011 and has been documented consistently since that time.  LE US performed in ED of BL LE again confirm right LE DVT. He is on chronic Warfarin therapy. INR therapeutic at 2.3. Right LE significantly larger than left. -Warfarin dosed per pharmacy -PT/INR  Chronic Diastolic Congestive Heart Failure Most recent ECHO 11/2014 showed an EF of 50-55% with a grade 1 diastolic dysfunction. Takes Lasix 40mg  daily for volume control. Does not appear volume overloaded on examination and he has intact renal function. -Continue PO Lasix 40mg  -Daily weights, I&O's -BNP 8  Tobacco abuse -Smoking cessation. Patient aware that continued tobacco abuse will worsen his chronic lung disease.   Major Depressive Disorder Continuing Cymbalta 60 mg and Trazodone 100 mg PRN sleep  Diet: HH IVF: SNL Code Status: Full code DVT Prophylaxis: On Warfarin   Dispo: Admit patient to Observation with expected length of stay less than 2 midnights.  SignedEinar Gip, DO 05/27/2016, 1:31 AM  Pager: [phone]

## 2016-05-27 NOTE — Progress Notes (Signed)
   Subjective: Feeling well, breathing at baseline, cough improved.  Wants to leave for family funeral at noon.  Objective:  Vital signs in last 24 hours: Vitals:   05/27/16 0015 05/27/16 0055 05/27/16 0439 05/27/16 0904  BP: 129/75 121/78 117/72   Pulse:   76 72  Resp: 20  18 18   Temp:  97.7 F (36.5 C) 97.7 F (36.5 C)   TempSrc:  Oral    SpO2:  97% 92%   Weight:   249 lb 1.9 oz (113 kg)   Height:       Physical Exam  Constitutional: He is oriented to person, place, and time. He appears well-developed and well-nourished. No distress.  Cardiovascular: Normal rate and regular rhythm.   Pulmonary/Chest:  Normal work of breathing, no respiratory distress Good air movement Mild diffuse wheezes bilaterally  Neurological: He is alert and oriented to person, place, and time.  Psychiatric: He has a normal mood and affect. His behavior is normal.   Component     Latest Ref Rng & Units 05/27/2016  Influenza A By PCR     NEGATIVE NEGATIVE  Influenza B By PCR     NEGATIVE NEGATIVE    Assessment/Plan:  Active Problems:   Major depressive disorder, recurrent episode, severe (HCC)   Asthma, chronic   Long-term (current) use of anticoagulants   Right leg DVT (HCC)   Tobacco abuse   COPD exacerbation ([place])  50 year old current smoker with 10 year history of asthma/COPD with 2 day history of dyspnea, wheezing, and productive cough consistent with exacerbation of his chronic airway disease.  His breathing has returned to baseline.  #COPD Exacerbation #Asthma Reports no history of childhood asthma, pulmonary symptoms began in his 78s. Normal CXR, afebrile, no concern for pneumonia. Presentation consistent with URI triggering COPD exacerbation vs asthma exacerbation in this current smoker.  Flu negative.  Needs PFTs at baseline to further characterize his airway disease. -Ambulate w/ pulse ox -Complete 5 days steroids with prednisone 40 mg PO -Complete 5 days antibiotics with  azithromycin 250 mg daily -Counseled regarding tobacco cessation -Outpatient PFTS  Dispo: Anticipated discharge home today.  Minus Liberty, MD 05/27/2016, 9:51 AM Pager: [phone]

## 2016-05-27 NOTE — Discharge Summary (Signed)
Name: [name] [name] MRN: [id number] DOB: Aug 28, 1965 50 y.o. PCP: Dellia Nims, MD  Date of Admission: 05/26/2016  4:39 PM Date of Discharge: 05/27/2016 Attending Physician: Lalla Brothers, MD  Discharge Diagnosis:  1. COPD Exacerbation  Active Problems:   Major depressive disorder, recurrent episode, severe (HCC)   Asthma, chronic   Long-term (current) use of anticoagulants   Right leg DVT ([place])   Tobacco abuse   COPD exacerbation ([place])   Discharge Medications:   Medication List    STOP taking these medications   cephALEXin 500 MG capsule Commonly known as:  KEFLEX     TAKE these medications   albuterol 108 (90 Base) MCG/ACT inhaler Commonly known as:  PROVENTIL HFA;VENTOLIN HFA Inhale 2 puffs into the lungs every 6 (six) hours as needed for wheezing or shortness of breath.   albuterol (2.5 MG/3ML) 0.083% nebulizer solution Commonly known as:  PROVENTIL Take 3 mLs (2.5 mg total) by nebulization every 6 (six) hours as needed for wheezing or shortness of breath.   augmented betamethasone dipropionate 0.05 % ointment Commonly known as:  DIPROLENE-AF Apply topically 2 (two) times daily. 340B patient, NDC [id number]   azithromycin 250 MG tablet Commonly known as:  ZITHROMAX Take 1 tablet (250 mg total) by mouth daily.   diclofenac sodium 1 % Gel Commonly known as:  VOLTAREN Apply 2 g topically 4 (four) times daily. What changed:  when to take this  reasons to take this   DULoxetine 60 MG capsule Commonly known as:  CYMBALTA Take 1 capsule (60 mg total) by mouth daily. IM program   Fluticasone-Salmeterol 500-50 MCG/DOSE Aepb Commonly known as:  ADVAIR DISKUS Inhale 1 puff into the lungs 2 (two) times daily.   furosemide 40 MG tablet Commonly known as:  LASIX Take 1 tablet (40 mg total) by mouth daily.   gabapentin 300 MG capsule Commonly known as:  NEURONTIN Take 2 capsules (600 mg total) by mouth 3 (three) times daily. What changed:  how much to  take   hydrocerin Crea Apply 1 application topically daily.   nicotine 14 mg/24hr patch Commonly known as:  NICODERM CQ - dosed in mg/24 hours Place 1 patch (14 mg total) onto the skin daily.   predniSONE 20 MG tablet Commonly known as:  DELTASONE Take 2 tablets (40 mg total) by mouth daily with breakfast. Start taking on:  05/28/2016   traMADol 50 MG tablet Commonly known as:  ULTRAM Take 1 tablet (50 mg total) by mouth every 6 (six) hours as needed.   traZODone 100 MG tablet Commonly known as:  DESYREL Take 1 tablet (100 mg total) by mouth at bedtime as needed for sleep.   warfarin 5 MG tablet Commonly known as:  COUMADIN Take 3 tablets (15mg ) by mouth Monday-Friday and 2.5 tablets (12.5mg ) Sat-Sun.       Disposition and follow-up:   Mr.Daekwon A Hawk was discharged from [facility] in Stable condition.  At the hospital follow up visit please address:  1.  COPD/Asthma.  Ensure breathing back to baseline.  Continue diagnostic work to clarify his airway disease.  2.  Tobacco Cessation.  Ask about tobacco use.  Encourage cessation.  Consider medical assistance with nicotine replacement, varenicline, bupropion.  3.  Labs / imaging needed at time of follow-up: PFTs  4.  Pending labs/ test needing follow-up: none  Follow-up Appointments: Follow-up Information    [facility]. Schedule an appointment as soon as possible for a visit.  Why:  they will call you to schedule appointment.  If you do not hear from them by Tuesday, please call the clinic to make an appointment Contact information: [address] [place] Course by problem list: Active Problems:   Major depressive disorder, recurrent episode, severe (HCC)   Asthma, chronic   Long-term (current) use of anticoagulants   Right leg DVT ([place])   Tobacco abuse   COPD exacerbation ([place])   1. COPD/Asthma  Exacerbation Mr Scherman presented with 2 days of dyspnea, productive cough, and wheezing.  He was using his albuterol inhaler several times per day without relief.  He is a current smoker and denies childhood asthma, with respiratory symptoms only developing in his 7s.  He also has history of atopy with nasal allergies and eczema.  He was treated with nebulized bronchodilators, steroids, and antibiotics with rapid improvement, and felt back to baseline on the day after admission. He was informed of the plan to discharge him if he did not desaturate while walking, with prescriptions to finish 5 days of prednisone and azithromycin.  He said he had a family funeral to attend at noon.  He eloped before ambulatory pulse oximetry was performed, and did not receive his discharge paperwork or instructions, but he had already been told to pick up his prescriptions at the [facility].  2. CHF He was euvolemic throughout admission, lungs without crackles, and low BNP.  Continued on home 40 mg PO lasix daily.   Discharge Vitals:   BP 117/72   Pulse 72   Temp 97.7 F (36.5 C)   Resp 18   Ht 6\' 4"  (1.93 m)   Wt 249 lb 1.9 oz (113 kg)   SpO2 92%   BMI 30.32 kg/m   Pertinent Labs, Studies, and Procedures:   CBC Latest Ref Rng & Units 05/27/2016 05/26/2016 03/30/2016  WBC 4.0 - 10.5 K/uL 7.7 6.1 4.5  Hemoglobin 13.0 - 17.0 g/dL 15.8 16.0 15.6  Hematocrit 39.0 - 52.0 % 46.7 47.3 47.0  Platelets 150 - 400 K/uL 210 187 189   BMP Latest Ref Rng & Units 05/27/2016 05/26/2016 03/30/2016  Glucose 65 - 99 mg/dL 154(H) 101(H) 119(H)  BUN 6 - 20 mg/dL 9 6 9   Creatinine 0.61 - 1.24 mg/dL 1.08 1.05 1.11  Sodium 135 - 145 mmol/L 136 137 140  Potassium 3.5 - 5.1 mmol/L 4.6 3.6 3.8  Chloride 101 - 111 mmol/L 103 102 106  CO2 22 - 32 mmol/L 23 27 29   Calcium 8.9 - 10.3 mg/dL 9.2 9.1 9.0   BNP (last 3 results)  Recent Labs  05/26/16 1738  BNP 8.7   Chest Radiographs 05/26/2016 IMPRESSION: 1. Linear  opacities favoring subsegmental atelectasis in both lung bases. Otherwise, no significant abnormalities are observed.  Component     Latest Ref Rng & Units 05/27/2016  Influenza A By PCR     NEGATIVE NEGATIVE  Influenza B By PCR     NEGATIVE NEGATIVE   Discharge Instructions:  You were admitted to the hospital for a COPD exacerbation.  This explains your cough and worsening breathing for the last few days.  We treated you with breathing treatments, steroids, and antibiotics, and your breathing returned to normal.    I have prescribed you several more days of prednisone and azithromycin.  Please pick up your meds right away.  Start taking the azithromycin tonight and the prednisone  tomorrow morning.  Make sure to follow up at the [facility] in the next 1-2 weeks.  They should call you to make an appointment, but if you haven't heard from them by the middle of the week please call.  If you have troubles breathing or worse wheezing, please call the clinic for an urgent appointment or 911 if it is an emergency.  Signed: Minus Liberty, MD 05/27/2016, 11:25 AM   Pager: [phone]

## 2016-05-27 NOTE — ED Notes (Signed)
Pt ambulated to bathroom without difficulty.

## 2016-05-27 NOTE — Progress Notes (Signed)
Went into room to ambulate patient and check O2 sats.Pts telemetry box was laying across chair and gown left on the bed. Pt is not in room and appears to of left. Paged Physician to notify

## 2016-05-27 NOTE — Progress Notes (Addendum)
Late entry  admit  To  Room 6e202 From  Home  Via  ED Orientation  To  Room  Call bell  Safety plan Mental  Status  Alert and oriented  X 4 Living arrangements  Home with family  Assessment  See  Doc flow  Sheet

## 2016-05-27 NOTE — Progress Notes (Signed)
Informed by nurse Haywood Lasso, RN that patient had eloped prior to completion of ordered ambulatory pulse oximetry.  At morning rounds, plan to discharge after ambulatory pulse oximetry was discussed.  At that time, he was informed that his prescriptions would be sent to the [facility].

## 2016-05-27 NOTE — Progress Notes (Signed)
ANTICOAGULATION CONSULT NOTE - Initial Consult  Pharmacy Consult for Coumadin Indication: h/o DVT  Allergies  Allergen Reactions  . Xarelto [Rivaroxaban] Dermatitis    Blisters skin  . Clindamycin/Lincomycin Dermatitis    Severe rash/dermatitis from November 2015 still present Jan 2016 from [facility]    Patient Measurements: Height: 6\' 4"  (193 cm) Weight: 250 lb (113.4 kg) IBW/kg (Calculated) : 86.8  Vital Signs: Temp: 97.7 F (36.5 C) (11/18 0055) Temp Source: Oral (11/18 0055) BP: 121/78 (11/18 0055) Pulse Rate: 91 (11/17 2345)  Labs:  Recent Labs  05/26/16 1738  HGB 16.0  HCT 47.3  PLT 187  LABPROT 26.0*  INR 2.34  CREATININE 1.05    Estimated Creatinine Clearance: 117.2 mL/min (by C-G formula based on SCr of 1.05 mg/dL).   Medical History: Past Medical History:  Diagnosis Date  . Alcohol abuse   . Arthritis    knees  . Asthma    uses Albuterol daily as needed  . Bipolar 1 disorder ([employer])   . Bronchitis    last time 2 yrs ago  . Cellulitis   . Chronic dermatitis    due to Xarelto Rx  . Cough    smokers  . Depression   . DVT (deep venous thrombosis) (HCC)    on Coumadin Rx  . DVT (deep venous thrombosis) ([place])   . GERD (gastroesophageal reflux disease)    only takes something about 2 times a yr  . GERD (gastroesophageal reflux disease)   . Heart murmur   . Joint pain   . Pulmonary embolism ([place]) 2014  . Shortness of breath   . Substance abuse    crack, cocaine, last use 2007  . Tobacco abuse   . Tuberculosis    ' I test Positive "    Medications:  Prescriptions Prior to Admission  Medication Sig Dispense Refill Last Dose  . albuterol (PROVENTIL HFA;VENTOLIN HFA) 108 (90 Base) MCG/ACT inhaler Inhale 2 puffs into the lungs every 6 (six) hours as needed for wheezing or shortness of breath. 1 Inhaler 3 05/26/2016 at Unknown time  . diclofenac sodium (VOLTAREN) 1 % GEL Apply 2 g topically 4 (four) times daily. (Patient taking differently: Apply  2 g topically 4 (four) times daily as needed (pain). ) 1 Tube 0 Past Week at Unknown time  . DULoxetine (CYMBALTA) 60 MG capsule Take 1 capsule (60 mg total) by mouth daily. IM program 90 capsule 3 05/25/2016 at Unknown time  . Fluticasone-Salmeterol (ADVAIR DISKUS) 500-50 MCG/DOSE AEPB Inhale 1 puff into the lungs 2 (two) times daily. 1 each 6 05/25/2016 at Unknown time  . furosemide (LASIX) 40 MG tablet Take 1 tablet (40 mg total) by mouth daily. 90 tablet 0 05/25/2016 at Unknown time  . gabapentin (NEURONTIN) 300 MG capsule Take 2 capsules (600 mg total) by mouth 3 (three) times daily. (Patient taking differently: Take 900 mg by mouth 3 (three) times daily. ) 180 capsule 0 05/26/2016 at Unknown time  . hydrocerin (EUCERIN) CREA Apply 1 application topically daily. 454 g 0 Past Month at Unknown time  . nicotine (NICODERM CQ - DOSED IN MG/24 HOURS) 14 mg/24hr patch Place 1 patch (14 mg total) onto the skin daily. 42 patch 0 05/25/2016 at Unknown time  . traZODone (DESYREL) 100 MG tablet Take 1 tablet (100 mg total) by mouth at bedtime as needed for sleep. 90 tablet 0 Past Month at Unknown time  . warfarin (COUMADIN) 5 MG tablet Take 3 tablets (15mg ) by mouth Monday-Friday  and 2.5 tablets (12.5mg ) Sat-Sun. 240 tablet 3 05/25/2016 at Unknown time  . albuterol (PROVENTIL) (2.5 MG/3ML) 0.083% nebulizer solution Take 3 mLs (2.5 mg total) by nebulization every 6 (six) hours as needed for wheezing or shortness of breath. (Patient not taking: Reported on 05/26/2016) 75 mL 0 Not Taking at Unknown time  . augmented betamethasone dipropionate (DIPROLENE-AF) 0.05 % ointment Apply topically 2 (two) times daily. 340B patient, [place] (Patient not taking: Reported on 05/26/2016) 50 g 1 Not Taking at Unknown time  . cephALEXin (KEFLEX) 500 MG capsule Take 1 capsule (500 mg total) by mouth 4 (four) times daily. (Patient not taking: Reported on 05/26/2016) 20 capsule 0 Not Taking at Unknown time  . traMADol  (ULTRAM) 50 MG tablet Take 1 tablet (50 mg total) by mouth every 6 (six) hours as needed. (Patient not taking: Reported on 05/26/2016) 15 tablet 0 Not Taking at Unknown time   Scheduled:  . azithromycin  250 mg Oral Daily  . DULoxetine  60 mg Oral Daily  . furosemide  40 mg Oral Daily  . gabapentin  600 mg Oral TID  . ipratropium-albuterol  3 mL Nebulization Q4H while awake  . mometasone-formoterol  2 puff Inhalation BID  . predniSONE  40 mg Oral Q breakfast  . 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  Goal of Therapy:  INR 2-3   Plan:  Will continue home Coumadin dose of 15mg  MTWTF and 12.5mg  SS and monitor INR.  Wynona Neat, PharmD, BCPS  05/27/2016,2:02 AM

## 2016-05-27 NOTE — Discharge Instructions (Signed)
You were admitted to the hospital for a COPD exacerbation.  This explains your cough and worsening breathing for the last few days.  We treated you with breathing treatments, steroids, and antibiotics, and your breathing returned to normal.    I have prescribed you several more days of prednisone and azithromycin.  Please pick up your meds right away.  Start taking the azithromycin tonight and the prednisone tomorrow morning.  Make sure to follow up at the [facility] in the next 1-2 weeks.  They should call you to make an appointment, but if you haven't heard from them by the middle of the week please call.  If you have troubles breathing or worse wheezing, please call the clinic for an urgent appointment or 911 if it is an emergency.  Chronic Obstructive Pulmonary Disease Exacerbation Chronic obstructive pulmonary disease (COPD) is a common lung condition in which airflow from the lungs is limited. COPD is a general term that can be used to describe many different lung problems that limit airflow, including chronic bronchitis and emphysema. COPD exacerbations are episodes when breathing symptoms become much worse and require extra treatment. Without treatment, COPD exacerbations can be life threatening, and frequent COPD exacerbations can cause further damage to your lungs. What are the causes?  Respiratory infections.  Exposure to smoke.  Exposure to air pollution, chemical fumes, or dust. Sometimes there is no apparent cause or trigger. What increases the risk?  Smoking cigarettes.  Older age.  Frequent prior COPD exacerbations. What are the signs or symptoms?  Increased coughing.  Increased thick spit (sputum) production.  Increased wheezing.  Increased shortness of breath.  Rapid breathing.  Chest tightness. How is this diagnosed? Your medical history, a physical exam, and tests will help your health care provider make a diagnosis. Tests may include:  A  chest X-ray.  Basic lab tests.  Sputum testing.  An arterial blood gas test. How is this treated? Depending on the severity of your COPD exacerbation, you may need to be admitted to a hospital for treatment. Some of the treatments commonly used to treat COPD exacerbations are:  Antibiotic medicines.  Bronchodilators. These are drugs that expand the air passages. They may be given with an inhaler or nebulizer. Spacer devices may be needed to help improve drug delivery.  Corticosteroid medicines.  Supplemental oxygen therapy.  Airway clearing techniques, such as noninvasive ventilation (NIV) and positive expiratory pressure (PEP). These provide respiratory support through a mask or other noninvasive device. Follow these instructions at home:  Do not smoke. Quitting smoking is very important to prevent COPD from getting worse and exacerbations from happening as often.  Avoid exposure to all substances that irritate the airway, especially to tobacco smoke.  If you were prescribed an antibiotic medicine, finish it all even if you start to feel better.  Take all medicines as directed by your health care provider.It is important to use correct technique with inhaled medicines.  Drink enough fluids to keep your urine clear or pale yellow (unless you have a medical condition that requires fluid restriction).  Use a cool mist vaporizer. This makes it easier to clear your chest when you cough.  If you have a home nebulizer and oxygen, continue to use them as directed.  Maintain all necessary vaccinations to prevent infections.  Exercise regularly.  Eat a healthy diet.  Keep all follow-up appointments as directed by your health care provider. Get help right away if:  You have worsening shortness of breath.  You have trouble talking.  You have severe chest pain.  You have blood in your sputum.  You have a fever.  You have weakness, vomit repeatedly, or faint.  You feel  confused.  You continue to get worse. This information is not intended to replace advice given to you by your health care provider. Make sure you discuss any questions you have with your health care provider. Document Released: 04/23/2007 Document Revised: 12/02/2015 Document Reviewed: 02/28/2013 Elsevier Interactive Patient Education  2017 [employer].

## 2016-05-29 ENCOUNTER — Telehealth: Payer: Self-pay | Admitting: Pharmacist

## 2016-05-29 DIAGNOSIS — J441 Chronic obstructive pulmonary disease with (acute) exacerbation: Secondary | ICD-10-CM

## 2016-05-29 MED ORDER — MOMETASONE FURO-FORMOTEROL FUM 200-5 MCG/ACT IN AERO: 2.0000 | INHALATION_SPRAY | Freq: Two times a day (BID) | RESPIRATORY_TRACT | 3 refills | Status: DC

## 2016-05-29 MED FILL — Albuterol Sulfate Soln Nebu 0.5% (5 MG/ML): RESPIRATORY_TRACT | Qty: 20 | Status: AC

## 2016-05-29 NOTE — Progress Notes (Signed)
Transition Care Management Follow-up Telephone Call   Date discharged? 05/27/16   How have you been since you were released from the hospital? improved    Do you understand why you were in the hospital? yes   Do you understand the discharge instructions? yes   Where were you discharged to? home  Items Reviewed:  Medications reviewed: yes  Allergies reviewed: yes  Dietary changes reviewed: yes  Referrals reviewed: yes  Functional Questionnaire:   Activities of Daily Living (ADLs):   He states they are independent in the following: all States they require assistance with the following: none   Any transportation issues/concerns?: no   Any patient concerns? Yes, needs help getting a nebulizer machine   Confirmed importance and date/time of follow-up visits scheduled yes  Provider Appointment booked with [facility] 06/05/16  Confirmed with patient if condition begins to worsen call PCP or go to the ER.  Patient was given the office number and encouraged to call back with question or concerns. yes

## 2016-06-05 ENCOUNTER — Ambulatory Visit: Payer: Self-pay

## 2016-06-05 ENCOUNTER — Encounter: Payer: Self-pay | Admitting: Internal Medicine

## 2016-07-14 ENCOUNTER — Ambulatory Visit (INDEPENDENT_AMBULATORY_CARE_PROVIDER_SITE_OTHER): Payer: Self-pay | Admitting: Pharmacist

## 2016-07-14 ENCOUNTER — Other Ambulatory Visit: Payer: Self-pay | Admitting: Pharmacist

## 2016-07-14 DIAGNOSIS — Z7901 Long term (current) use of anticoagulants: Secondary | ICD-10-CM

## 2016-07-14 DIAGNOSIS — F339 Major depressive disorder, recurrent, unspecified: Secondary | ICD-10-CM

## 2016-07-14 DIAGNOSIS — I82511 Chronic embolism and thrombosis of right femoral vein: Secondary | ICD-10-CM

## 2016-07-14 DIAGNOSIS — Z9114 Patient's other noncompliance with medication regimen: Secondary | ICD-10-CM

## 2016-07-14 DIAGNOSIS — Z720 Tobacco use: Secondary | ICD-10-CM

## 2016-07-14 DIAGNOSIS — I2699 Other pulmonary embolism without acute cor pulmonale: Secondary | ICD-10-CM

## 2016-07-14 LAB — POCT INR: INR: 2.4

## 2016-07-14 MED ORDER — NICOTINE 10 MG IN INHA: RESPIRATORY_TRACT | 1 refills | Status: AC

## 2016-07-14 MED ORDER — WARFARIN SODIUM 5 MG PO TABS: ORAL_TABLET | ORAL | 3 refills | Status: DC

## 2016-07-14 NOTE — Progress Notes (Signed)
Anticoagulation Management [name] [name] is a 51 y.o. male who reports to the clinic for monitoring of warfarin treatment.   Indication: DVT and PEhistory Duration: indefinite  Anticoagulation Clinic Visit History: Patient does report signs/symptoms of bleeding or thromboembolism; no changes reported  Anticoagulation Episode Summary    Current INR goal:   2.0-3.0  TTR:   41.6 % (1.3 y)  Next INR check:   08/18/2016  INR from last check:   2.4 (07/14/2016)  Weekly max dose:     Target end date:     INR check location:     Preferred lab:     Send INR reminders to:   ANTICOAG IMP   Indications   Right leg DVT ([place]) [I82.401] Long-term (current) use of anticoagulants [Z79.01] DVT ([place]) [I82.409] Noncompliance with medication regimen [Z91.14]       Comments:         Anticoagulation Care Providers    Provider Role Specialty Phone number   Bartholomew Crews, MD  Internal Medicine [phone]     ASSESSMENT Recent Results: The most recent result is correlated with 100 mg per week: Lab Results  Component Value Date   INR 2.4 07/14/2016   INR 2.29 05/27/2016   INR 2.34 05/26/2016   Anticoagulation Dosing: INR as of 07/14/2016 and Previous Dosing Information    INR Dt INR Goal [name] Sun Mon Tue Wed Thu Fri Sat   07/14/2016 2.4 2.0-3.0 100 mg 12.5 mg 15 mg 15 mg 15 mg 15 mg 15 mg 12.5 mg    Anticoagulation Dose Instructions as of 07/14/2016      Total Sun Mon Tue Wed Thu Fri Sat   New Dose 100 mg 12.5 mg 15 mg 15 mg 15 mg 15 mg 15 mg 12.5 mg     (5 mg x 2.5)  (5 mg x 3)  (5 mg x 3)  (5 mg x 3)  (5 mg x 3)  (5 mg x 3)  (5 mg x 2.5)                           INR today: Therapeutic  Nicotine dependence: moderate; Smoking cessation instruction/counseling given: counseled patient on the dangers of tobacco use, advised patient to stop smoking, and reviewed strategies to maximize success. Patient reports he has tried NRT patches and gum in the past without success and  is unable to afford. States he is interested in nicotine inhaler which he can obtain free from pharmacy.  PLAN Weekly dose was unchanged   Patient Instructions  Patient educated about medication as defined in this encounter and verbalized understanding by repeating back instructions provided.  Patient advised to contact clinic or seek medical attention if signs/symptoms of bleeding or thromboembolism occur.  Also initiated Nicotine Inhaler. Treatment was reviewed with the patient, including name, instructions, goals of therapy, potential side effects, importance of adherence, and safe use. Provided patient with information on free tobacco cessation classes through [facility].  Patient verbalized understanding by repeating back information and was advised to contact me if further medication-related questions arise. Patient was also provided an information handout.  Follow-up Return in about 5 weeks (around 08/18/2016) for Follow up INR around 08/18/16 at 2:30pm.  [name],[name]  20 minutes spent face-to-face with the patient during the encounter. 50% of time spent on education. 50% of time was spent on assessment, plan, and coordination of care.

## 2016-07-14 NOTE — Patient Instructions (Signed)
Patient educated about medication as defined in this encounter and verbalized understanding by repeating back instructions provided.   

## 2016-07-17 MED ORDER — DULOXETINE HCL 60 MG PO CPEP: 60.0000 mg | ORAL_CAPSULE | Freq: Every day | ORAL | 3 refills | Status: DC

## 2016-07-18 NOTE — Progress Notes (Signed)
INTERNAL MEDICINE TEACHING ATTENDING ADDENDUM - [name] Groves, DO Duration- indefinate, Indication- DVT/PE, INR-  Lab Results  Component Value Date   INR 2.4 07/14/2016  . Agree with pharmacy recommendations as outlined in their note.

## 2016-07-31 ENCOUNTER — Ambulatory Visit (INDEPENDENT_AMBULATORY_CARE_PROVIDER_SITE_OTHER): Payer: Self-pay | Admitting: Internal Medicine

## 2016-07-31 ENCOUNTER — Encounter: Payer: Self-pay | Admitting: Internal Medicine

## 2016-07-31 DIAGNOSIS — M792 Neuralgia and neuritis, unspecified: Secondary | ICD-10-CM

## 2016-07-31 DIAGNOSIS — J984 Other disorders of lung: Secondary | ICD-10-CM

## 2016-07-31 DIAGNOSIS — M25551 Pain in right hip: Secondary | ICD-10-CM

## 2016-07-31 DIAGNOSIS — G4700 Insomnia, unspecified: Secondary | ICD-10-CM

## 2016-07-31 DIAGNOSIS — Z79899 Other long term (current) drug therapy: Secondary | ICD-10-CM

## 2016-07-31 DIAGNOSIS — M25552 Pain in left hip: Secondary | ICD-10-CM

## 2016-07-31 DIAGNOSIS — Z23 Encounter for immunization: Secondary | ICD-10-CM

## 2016-07-31 DIAGNOSIS — F17210 Nicotine dependence, cigarettes, uncomplicated: Secondary | ICD-10-CM

## 2016-07-31 DIAGNOSIS — Z720 Tobacco use: Secondary | ICD-10-CM

## 2016-07-31 DIAGNOSIS — K219 Gastro-esophageal reflux disease without esophagitis: Secondary | ICD-10-CM

## 2016-07-31 DIAGNOSIS — J449 Chronic obstructive pulmonary disease, unspecified: Secondary | ICD-10-CM

## 2016-07-31 DIAGNOSIS — R202 Paresthesia of skin: Secondary | ICD-10-CM

## 2016-07-31 DIAGNOSIS — G5713 Meralgia paresthetica, bilateral lower limbs: Secondary | ICD-10-CM

## 2016-07-31 MED ORDER — MOMETASONE FURO-FORMOTEROL FUM 200-5 MCG/ACT IN AERO: 2.0000 | INHALATION_SPRAY | Freq: Two times a day (BID) | RESPIRATORY_TRACT | 3 refills | Status: DC

## 2016-07-31 MED ORDER — GABAPENTIN 300 MG PO CAPS: 900.0000 mg | ORAL_CAPSULE | Freq: Three times a day (TID) | ORAL | 2 refills | Status: DC

## 2016-07-31 MED ORDER — TRAZODONE HCL 100 MG PO TABS: 100.0000 mg | ORAL_TABLET | Freq: Every evening | ORAL | 0 refills | Status: DC | PRN

## 2016-07-31 MED ORDER — PANTOPRAZOLE SODIUM 40 MG PO TBEC: 40.0000 mg | DELAYED_RELEASE_TABLET | Freq: Every day | ORAL | 1 refills | Status: DC

## 2016-07-31 NOTE — Progress Notes (Signed)
   CC: f/up of asthma/copd, complain of back pain/leg pain  HPI:  Mr.Izic A Viens is a 51 y.o. PMH as listed below is here for follow up of asthma/copd and complaint of back pain/leg pain.   Past Medical History:  Diagnosis Date  . Alcohol abuse   . Arthritis    knees  . Asthma    uses Albuterol daily as needed  . Bipolar 1 disorder ([place])   . Bronchitis    last time 2 yrs ago  . Cellulitis   . Chronic dermatitis    due to Xarelto Rx  . Cough    smokers  . Depression   . DVT (deep venous thrombosis) ([facility])    on Coumadin Rx  . DVT (deep venous thrombosis) ([place])   . GERD (gastroesophageal reflux disease)    only takes something about 2 times a yr  . GERD (gastroesophageal reflux disease)   . Heart murmur   . Joint pain   . Pulmonary embolism ([place]) 2014  . Shortness of breath   . Substance abuse    crack, cocaine, last use 2007  . Tobacco abuse   . Tuberculosis    ' I test Positive "   Was admitted for COPD/Asthma excerbation on 05/27/2016. He is a smoker. Inpatient team recommended PFT. No PFT in chart. On mometasone-formoterol (dulera) albuterol prn. Using albuterol 4times at night every night. He starts coughing after 7 pm with wheezing every night. No clear triggers. Sometimes symptoms start after eating. Does have some GERD symptoms, not taking anything for it. Complaint with his dulera. Continues to smoke 7 cigs daily.   Still having pain and numbness on lateral hip, currently on both hips radiating down to his leg. Wears pants that hangs down to his hip and feels that it may be the cause. No pain on the back. No weakness. No urinary or bowel control loss.   Has trouble sleeping at night. Using trazodone which helps.   Review of Systems:    Review of Systems  Constitutional: Negative for chills and fever.  Respiratory: Positive for cough and shortness of breath.   Cardiovascular: Negative for chest pain, palpitations and leg swelling.  Gastrointestinal: Negative  for heartburn and nausea.  Genitourinary: Negative for dysuria.  Musculoskeletal:       Hio pain  Neurological: Negative for dizziness, tingling and headaches.    Physical Exam:  Vitals:   07/31/16 1548  BP: 116/77  Pulse: 89  Temp: 98.1 F (36.7 C)  TempSrc: Oral  SpO2: 100%  Weight: 265 lb (120.2 kg)   Physical Exam  Constitutional: He is oriented to person, place, and time. He appears well-developed and well-nourished. No distress.  HENT:  Head: Normocephalic and atraumatic.  Eyes: Conjunctivae are normal. No scleral icterus.  Neck: Normal range of motion. Neck supple.  Cardiovascular: Normal rate and regular rhythm.  Exam reveals no gallop and no friction rub.   No murmur heard. Respiratory: Effort normal and breath sounds normal. No respiratory distress. He has no wheezes. He has no rales.  GI: Soft. Bowel sounds are normal.  Musculoskeletal: Normal range of motion. He exhibits no edema or deformity.  Neurological: He is alert and oriented to person, place, and time. No cranial nerve deficit.  Skin: He is not diaphoretic.    Assessment & Plan:   See Encounters Tab for problem based charting.  Patient discussed with Dr. Dareen Piano

## 2016-07-31 NOTE — Assessment & Plan Note (Signed)
Has sx of GERD, which may be causing coughing and wheezing symptoms which he is having. Will do trial of PPI.  Start protonix 40mg  daily.

## 2016-07-31 NOTE — Assessment & Plan Note (Signed)
Continues to smoke 6-7 cigs a day. Has nicotine patch but does not use it. Not ready to quit yet. Offered cessation advice.

## 2016-07-31 NOTE — Patient Instructions (Signed)
Will get PFT for you.  Stop smoking.  Start taking protonix for acid reflux. Take in in emtpy stomach 30 mins before eating.   F/up in 3 months.

## 2016-07-31 NOTE — Assessment & Plan Note (Signed)
insominia is likely from using albuterol 4x nightly. Will try to get him off of using it so much with other treatments. In the mean time, refilled his trazodone.

## 2016-07-31 NOTE — Assessment & Plan Note (Addendum)
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  He is on chronic gabapentin.  We suggested changing his pant wearing habits so it does not hang down to the hip. If it gets better with this change, may just taper the gabapentin slowly.

## 2016-07-31 NOTE — Assessment & Plan Note (Addendum)
Continues to have numbness on both lateral hips radiating down to his feet. Wears pants that hangs down to his hips. voltaren gel that was prescribed before does help sometimes.  Asked him to avoid wearing pants down to his hips. Cont voltaren as needed. F/up as needed for this.   Currently taking gabapentin. Will continue gapabentin for now and slowly taper off if his symptoms improve.

## 2016-07-31 NOTE — Assessment & Plan Note (Addendum)
No formal dx of COPD but has about 15 years of smoking hx. On dulera and albuterol. Using albuterol 4 times a day. Symptoms get worse after 7 pm every night. No environmental triggers. Continues to smoke.  He likely has COPD that's not adequately treated. Will do PFT. If COPD is suggested, would consider adding anti-cholinergic such as spirivia.   GERD may be a component of these, will add protonix.  Goal is to decrease his SABA use.

## 2016-08-03 NOTE — Progress Notes (Signed)
Internal Medicine Clinic Attending  Case discussed with Dr. Ahmed at the time of the visit.  We reviewed the resident's history and exam and pertinent patient test results.  I agree with the assessment, diagnosis, and plan of care documented in the resident's note. 

## 2016-08-17 ENCOUNTER — Telehealth: Payer: Self-pay | Admitting: Pharmacist

## 2016-08-17 NOTE — Telephone Encounter (Signed)
Calling to confirm appt for 08/18/16 at 2:30

## 2016-08-18 ENCOUNTER — Ambulatory Visit (INDEPENDENT_AMBULATORY_CARE_PROVIDER_SITE_OTHER): Payer: Self-pay | Admitting: Pharmacist

## 2016-08-18 ENCOUNTER — Encounter: Payer: Self-pay | Admitting: Pharmacist

## 2016-08-18 DIAGNOSIS — Z7901 Long term (current) use of anticoagulants: Secondary | ICD-10-CM

## 2016-08-18 DIAGNOSIS — I82511 Chronic embolism and thrombosis of right femoral vein: Secondary | ICD-10-CM

## 2016-08-18 LAB — POCT INR: INR: 2

## 2016-08-18 NOTE — Patient Instructions (Signed)
Patient educated about medication as defined in this encounter and verbalized understanding by repeating back instructions provided.   

## 2016-08-18 NOTE — Progress Notes (Addendum)
Anticoagulation Management [name] [name] is a 51 y.o. male who reports to the clinic for monitoring of warfarin treatment.    Indication: DVT and PE history Duration: indefinite Supervising physician: [place] [facility] Visit History: Patient does not report signs/symptoms of bleeding or thromboembolism   Anticoagulation Episode Summary    Current INR goal:   2.0-3.0  TTR:   45.7 % (1.4 y)  Next INR check:   09/22/2016  INR from last check:   2.0 (08/18/2016)  Weekly max dose:     Target end date:     INR check location:     Preferred lab:     Send INR reminders to:   [facility]   Indications   Right leg DVT ([place]) [I82.401] Long-term (current) use of anticoagulants [Z79.01] DVT (Resolved) [I82.409] Noncompliance with medication regimen (Resolved) [Z91.14]       Comments:         Anticoagulation Care Providers    Provider Role Specialty Phone number   Bartholomew Crews, MD  Internal Medicine [phone]     ASSESSMENT Recent Results: The most recent result is correlated with 100 mg per week: Lab Results  Component Value Date   INR 2.0 08/18/2016   INR 2.4 07/14/2016   INR 2.29 05/27/2016   Anticoagulation Dosing: INR as of 08/18/2016 and Previous Dosing Information    INR Dt INR Goal [name] Sun Mon Tue Wed Thu Fri Sat   08/18/2016 2.0 2.0-3.0 100 mg 12.5 mg 15 mg 15 mg 15 mg 15 mg 15 mg 12.5 mg    Anticoagulation Dose Instructions as of 08/18/2016      Total Sun Mon Tue Wed Thu Fri Sat   New Dose 100 mg 12.5 mg 15 mg 15 mg 15 mg 15 mg 15 mg 12.5 mg     (5 mg x 2.5)  (5 mg x 3)  (5 mg x 3)  (5 mg x 3)  (5 mg x 3)  (5 mg x 3)  (5 mg x 2.5)                           INR today: Therapeutic  PLAN Weekly dose was unchanged.  Patient Instructions  Patient educated about medication as defined in this encounter and verbalized understanding by repeating back instructions provided.   Of note, he states he has been able to cut back his  smoking from 8-10 cigarettes/day to 2-4 cigarettes/day since starting Nicotrol inhaler. Patient was congratulated on his success and reminded of [employer]-Quit-Now and  smoking cessation classes. Patient advised to contact clinic or seek medical attention if signs/symptoms of bleeding or thromboembolism occur. Patient verbalized understanding by repeating back information and was advised to contact me if further medication-related questions arise. Patient was also provided an information handout.  Follow-up September 22, 2016  [name] [name]

## 2016-09-21 ENCOUNTER — Telehealth: Payer: Self-pay | Admitting: Internal Medicine

## 2016-09-21 NOTE — Telephone Encounter (Signed)
APT. REMINDER CALL, LMTCB °

## 2016-09-22 ENCOUNTER — Other Ambulatory Visit: Payer: Self-pay | Admitting: Pharmacist

## 2016-09-22 ENCOUNTER — Ambulatory Visit (INDEPENDENT_AMBULATORY_CARE_PROVIDER_SITE_OTHER): Payer: Self-pay | Admitting: Pharmacist

## 2016-09-22 DIAGNOSIS — Z7901 Long term (current) use of anticoagulants: Secondary | ICD-10-CM

## 2016-09-22 DIAGNOSIS — G4700 Insomnia, unspecified: Secondary | ICD-10-CM

## 2016-09-22 DIAGNOSIS — I82511 Chronic embolism and thrombosis of right femoral vein: Secondary | ICD-10-CM

## 2016-09-22 LAB — POCT INR: INR: 2.4

## 2016-09-22 NOTE — Patient Instructions (Signed)
Patient educated about medication as defined in this encounter and verbalized understanding by repeating back instructions provided.   

## 2016-09-22 NOTE — Progress Notes (Signed)
Anticoagulation Management [name] [name] is a 51 y.o. male who reports to the clinic for monitoring of warfarin treatment.    Indication: DVT history Duration: indefinite Supervising physician: Joni Reining  Anticoagulation Clinic Visit History: Patient does not report signs/symptoms of bleeding or thromboembolism  Anticoagulation Episode Summary    Current INR goal:   2.0-3.0  TTR:   49.3 % (1.4 y)  Next INR check:   10/24/2016  INR from last check:   2.4 (09/22/2016)  Weekly max dose:     Target end date:     INR check location:     Preferred lab:     Send INR reminders to:   [facility]   Indications   Right leg DVT ([place]) [I82.401] Long-term (current) use of anticoagulants [Z79.01] DVT (Resolved) [I82.409] Noncompliance with medication regimen (Resolved) [Z91.14]       Comments:         Anticoagulation Care Providers    Provider Role Specialty Phone number   Bartholomew Crews, MD  Internal Medicine [phone]     ASSESSMENT Recent Results: Lab Results  Component Value Date   INR 2.4 09/22/2016   INR 2.0 08/18/2016   INR 2.4 07/14/2016   Anticoagulation Dosing: INR as of 09/22/2016 and Previous Dosing Information    INR Dt INR Goal [name] Sun Mon Tue Wed Thu Fri Sat   09/22/2016 2.4 2.0-3.0 100 mg 12.5 mg 15 mg 15 mg 15 mg 15 mg 15 mg 12.5 mg    Anticoagulation Dose Instructions as of 09/22/2016      Total Sun Mon Tue Wed Thu Fri Sat   New Dose 100 mg 12.5 mg 15 mg 15 mg 15 mg 15 mg 15 mg 12.5 mg     (5 mg x 2.5)  (5 mg x 3)  (5 mg x 3)  (5 mg x 3)  (5 mg x 3)  (5 mg x 3)  (5 mg x 2.5)                           INR today: Therapeutic  PLAN Weekly dose was unchanged  There are no Patient Instructions on file for this visit. Patient advised to contact clinic or seek medical attention if signs/symptoms of bleeding or thromboembolism occur.  Patient verbalized understanding by repeating back information and was advised to contact me if further  medication-related questions arise. Patient was also provided an information handout.  Follow-up Return in about 4 weeks (around 10/20/2016).  Flossie Dibble

## 2016-09-25 MED ORDER — TRAZODONE HCL 100 MG PO TABS: 100.0000 mg | ORAL_TABLET | Freq: Every evening | ORAL | 0 refills | Status: DC | PRN

## 2016-09-26 NOTE — Progress Notes (Signed)
INTERNAL MEDICINE TEACHING ATTENDING ADDENDUM - Lucious Groves, DO Duration- indefinate, Indication- recurrent DVT, INR-  Lab Results  Component Value Date   INR 2.4 09/22/2016  . Agree with pharmacy recommendations as outlined in their note.

## 2016-11-10 ENCOUNTER — Telehealth: Payer: Self-pay | Admitting: Internal Medicine

## 2016-11-11 ENCOUNTER — Other Ambulatory Visit: Payer: Self-pay | Admitting: Internal Medicine

## 2016-11-11 DIAGNOSIS — G4700 Insomnia, unspecified: Secondary | ICD-10-CM

## 2016-11-13 ENCOUNTER — Telehealth: Payer: Self-pay | Admitting: Internal Medicine

## 2016-11-13 ENCOUNTER — Other Ambulatory Visit: Payer: Self-pay | Admitting: *Deleted

## 2016-11-13 DIAGNOSIS — G4700 Insomnia, unspecified: Secondary | ICD-10-CM

## 2016-11-13 MED ORDER — TRAZODONE HCL 100 MG PO TABS: 100.0000 mg | ORAL_TABLET | Freq: Every evening | ORAL | 0 refills | Status: DC | PRN

## 2016-11-13 NOTE — Telephone Encounter (Signed)
APT. REMINDER CALL, NO ANSWER, NO VOICEMAIL °

## 2016-11-14 ENCOUNTER — Ambulatory Visit: Payer: Self-pay

## 2016-11-17 NOTE — Telephone Encounter (Signed)
Reviewing chart for meds/ visits

## 2016-11-24 ENCOUNTER — Other Ambulatory Visit: Payer: Self-pay | Admitting: *Deleted

## 2016-11-24 DIAGNOSIS — I82511 Chronic embolism and thrombosis of right femoral vein: Secondary | ICD-10-CM

## 2016-11-24 DIAGNOSIS — M792 Neuralgia and neuritis, unspecified: Secondary | ICD-10-CM

## 2016-11-24 DIAGNOSIS — J45909 Unspecified asthma, uncomplicated: Secondary | ICD-10-CM

## 2016-11-24 DIAGNOSIS — I2699 Other pulmonary embolism without acute cor pulmonale: Secondary | ICD-10-CM

## 2016-11-24 NOTE — Telephone Encounter (Addendum)
Fax from [facility] - requesting refill on Gabapentin. Needs new diagnosis to be entered.

## 2016-11-25 ENCOUNTER — Encounter: Payer: Self-pay | Admitting: Internal Medicine

## 2016-11-25 MED ORDER — GABAPENTIN 300 MG PO CAPS: 900.0000 mg | ORAL_CAPSULE | Freq: Three times a day (TID) | ORAL | 2 refills | Status: DC

## 2016-11-25 MED ORDER — WARFARIN SODIUM 5 MG PO TABS: ORAL_TABLET | ORAL | 3 refills | Status: DC

## 2016-11-25 MED ORDER — ALBUTEROL SULFATE HFA 108 (90 BASE) MCG/ACT IN AERS: 2.0000 | INHALATION_SPRAY | Freq: Four times a day (QID) | RESPIRATORY_TRACT | 2 refills | Status: DC | PRN

## 2016-11-25 NOTE — Telephone Encounter (Signed)
Done

## 2016-11-27 ENCOUNTER — Telehealth: Payer: Self-pay | Admitting: Internal Medicine

## 2016-11-27 ENCOUNTER — Other Ambulatory Visit: Payer: Self-pay

## 2016-11-27 DIAGNOSIS — G4700 Insomnia, unspecified: Secondary | ICD-10-CM

## 2016-11-27 MED ORDER — TRAZODONE HCL 100 MG PO TABS: 100.0000 mg | ORAL_TABLET | Freq: Every evening | ORAL | 0 refills | Status: DC | PRN

## 2016-11-27 NOTE — Telephone Encounter (Signed)
APT. REMINDER CALL, NO ANSWER, NO VOICEMAIL °

## 2016-11-27 NOTE — Telephone Encounter (Signed)
Cymbalta and Coumadin have already been refilled ;Email was sent - called pt ; no answer and no VM.

## 2016-11-27 NOTE — Telephone Encounter (Signed)
CONFIRMED APT

## 2016-11-27 NOTE — Telephone Encounter (Signed)
DULoxetine (CYMBALTA) 60 MG capsule, warfarin (COUMADIN) 5 MG tablet, traZODone (DESYREL) 100 MG tablet,refill request @ [facility].

## 2016-11-27 NOTE — Telephone Encounter (Signed)
Patient returning call-he was informed that cymbalta and warfarin refills were sent to [facility] on 05/19 and to give it a few more days for delivery and to call  [facility] to track the location of the package, pt agreed.Regenia Skeeter, Darlene Cassady5/21/201811:06 AM      Pt still needs refill on the trazodone, which has already been forwarded to his pcp.

## 2016-11-28 ENCOUNTER — Ambulatory Visit (INDEPENDENT_AMBULATORY_CARE_PROVIDER_SITE_OTHER): Payer: Self-pay | Admitting: Pharmacist

## 2016-11-28 ENCOUNTER — Ambulatory Visit: Payer: Self-pay

## 2016-11-28 DIAGNOSIS — I82511 Chronic embolism and thrombosis of right femoral vein: Secondary | ICD-10-CM

## 2016-11-28 DIAGNOSIS — Z7901 Long term (current) use of anticoagulants: Secondary | ICD-10-CM

## 2016-11-28 LAB — POCT INR: INR: 2.6

## 2016-11-28 MED ORDER — WARFARIN SODIUM 5 MG PO TABS: ORAL_TABLET | ORAL | 0 refills | Status: DC

## 2016-11-28 NOTE — Patient Instructions (Signed)
Patient educated about medication as defined in this encounter and verbalized understanding by repeating back instructions provided.   

## 2016-11-28 NOTE — Progress Notes (Signed)
Anticoagulation Management [name] [name] is a 51 y.o. male who reports to the clinic for monitoring of warfarin treatment.    Indication: DVT and PE history, recurrent Duration: indefinite Supervising physician: Gilles Chiquito  Anticoagulation Clinic Visit History: Patient does not report signs/symptoms of bleeding or thromboembolism  Anticoagulation Episode Summary    Current INR goal:   2.0-3.0  TTR:   55.0 % (1.6 y)  Next INR check:   01/30/2017  INR from last check:   2.6 (11/28/2016)  Weekly max warfarin dose:     Target end date:     INR check location:     Preferred lab:     Send INR reminders to:   [facility]   Indications   Right leg DVT ([place]) [I82.401] Long-term (current) use of anticoagulants [Z79.01] DVT (Resolved) [I82.409] Noncompliance with medication regimen (Resolved) [Z91.14]       Comments:         Anticoagulation Care Providers    Provider Role Specialty Phone number   Bartholomew Crews, MD  Internal Medicine [phone]     ASSESSMENT Recent Results: The most recent result is correlated with 100 mg per week: Lab Results  Component Value Date   INR 2.6 11/28/2016   INR 2.4 09/22/2016   INR 2.0 08/18/2016   Anticoagulation Dosing: INR as of 11/28/2016 and Previous Warfarin Dosing Information    INR Dt INR Goal [name] Sun Mon Tue Wed Thu Fri Sat   11/28/2016 2.6 2.0-3.0 100 mg 12.5 mg 15 mg 15 mg 15 mg 15 mg 15 mg 12.5 mg    Anticoagulation Warfarin Dose Instructions as of 11/28/2016      Total Sun Mon Tue Wed Thu Fri Sat   New Dose 100 mg 12.5 mg 15 mg 15 mg 15 mg 15 mg 15 mg 12.5 mg     (5 mg x 2.5)  (5 mg x 3)  (5 mg x 3)  (5 mg x 3)  (5 mg x 3)  (5 mg x 3)  (5 mg x 2.5)                           INR today: therapeutic  PLAN Weekly dose was unchanged   Patient Instructions  Patient educated about medication as defined in this encounter and verbalized understanding by repeating back instructions provided.    Patient  advised to contact clinic or seek medical attention if signs/symptoms of bleeding or thromboembolism occur.  Patient verbalized understanding by repeating back information and was advised to contact me if further medication-related questions arise. Patient was also provided an information handout.  Follow-up Return in about 8 weeks (around 01/23/2017).  Flossie Dibble

## 2016-11-29 NOTE — Progress Notes (Signed)
I have reviewed Dr. Julianne Rice note.  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  INR 2.6, dose unchanged.

## 2016-12-14 ENCOUNTER — Encounter: Payer: Self-pay | Admitting: *Deleted

## 2016-12-18 ENCOUNTER — Ambulatory Visit (INDEPENDENT_AMBULATORY_CARE_PROVIDER_SITE_OTHER): Payer: Self-pay | Admitting: Internal Medicine

## 2016-12-18 VITALS — BP 130/91

## 2016-12-18 DIAGNOSIS — X58XXXD Exposure to other specified factors, subsequent encounter: Secondary | ICD-10-CM

## 2016-12-18 DIAGNOSIS — J309 Allergic rhinitis, unspecified: Secondary | ICD-10-CM

## 2016-12-18 DIAGNOSIS — M5416 Radiculopathy, lumbar region: Secondary | ICD-10-CM

## 2016-12-18 DIAGNOSIS — S81802D Unspecified open wound, left lower leg, subsequent encounter: Secondary | ICD-10-CM

## 2016-12-18 DIAGNOSIS — J449 Chronic obstructive pulmonary disease, unspecified: Secondary | ICD-10-CM

## 2016-12-18 DIAGNOSIS — F17210 Nicotine dependence, cigarettes, uncomplicated: Secondary | ICD-10-CM

## 2016-12-18 DIAGNOSIS — Z7951 Long term (current) use of inhaled steroids: Secondary | ICD-10-CM

## 2016-12-18 DIAGNOSIS — G8929 Other chronic pain: Secondary | ICD-10-CM

## 2016-12-18 DIAGNOSIS — J3089 Other allergic rhinitis: Secondary | ICD-10-CM

## 2016-12-18 MED ORDER — FLUTICASONE PROPIONATE 50 MCG/ACT NA SUSP: 1.0000 | Freq: Every day | NASAL | 2 refills | Status: DC

## 2016-12-18 MED ORDER — CETIRIZINE HCL 10 MG PO TABS: 10.0000 mg | ORAL_TABLET | Freq: Every day | ORAL | 2 refills | Status: DC

## 2016-12-18 MED ORDER — GABAPENTIN 300 MG PO CAPS: 900.0000 mg | ORAL_CAPSULE | Freq: Three times a day (TID) | ORAL | 2 refills | Status: DC

## 2016-12-18 NOTE — Assessment & Plan Note (Signed)
Having chronic lumbar radiculoatphy with back pain, radiation to lateral thigh all the way down to plantar surfaces b/l, with numbness tingling and weakness, feels weaker on left leg. On gabapentin 600mg  TID. Has not tried Physical therapy.

## 2016-12-18 NOTE — Assessment & Plan Note (Signed)
Has chronic leg wound on right leg for 1.5 year, has scab formation on exam, no drainage, no signs of infection.  Applied Silicone gel adhesive hydrocellular foam dressing to help healing. F/up in 6 weeks.

## 2016-12-18 NOTE — Progress Notes (Signed)
CC: right leg wound, back pain with radiation to legs  HPI:  [name] is a 51 y.o. with pmh as listed below is here for right leg wound, back pain with radiation to legs  Had DVT on the right leg, has a wound on posterior right lower leg for 1.5 year, it goes through scab formation, drainage, and scab formation over and over. Had gone to wound care center for this.   Back pain on lower back, has radiculopathy on lateral thighs all the way down to lateral thighs. This has been going on for over 2 years, also has numbness and tingling on both sides, left leg feels weaker. Gabapentin helps, takes 600mg  TID. Takes cymbalta for his mood but it has not helped with his back pain.   COPD - still smoking 1 cig a day, using albuterol 4 times a day, also using dulera. Symptoms gets worse after 6 PM. Feels chest pressure. Ordered PFT last visit, he did not get it done yet. We added protonix to help with GERD in case it was playing a role, helping his GERd symptoms but not his COPD. Not taking anything for allergies.      Past Medical History:  Diagnosis Date  . Alcohol abuse   . Arthritis    knees  . Asthma    uses Albuterol daily as needed  . Bipolar 1 disorder ([place])   . Bronchitis    last time 2 yrs ago  . Cellulitis   . Chronic dermatitis    due to Xarelto Rx  . Cough    smokers  . Depression   . DVT (deep venous thrombosis) ([facility])    on Coumadin Rx  . DVT (deep venous thrombosis) ([place])   . GERD (gastroesophageal reflux disease)    only takes something about 2 times a yr  . GERD (gastroesophageal reflux disease)   . Heart murmur   . Joint pain   . Pulmonary embolism ([place]) 2014  . Shortness of breath   . Substance abuse    crack, cocaine, last use 2007  . Tobacco abuse   . Tuberculosis    ' I test Positive "    Review of Systems:   Review of Systems  Constitutional: Negative for chills and fever.  Respiratory: Positive for sputum production, shortness of breath  and wheezing.   Cardiovascular: Negative for chest pain and palpitations.  Gastrointestinal: Negative for heartburn, nausea and vomiting.  Musculoskeletal: Positive for back pain and joint pain.  Neurological: Negative for dizziness and headaches.     Physical Exam:  Vitals:   12/18/16 0831  BP: (!) 130/91   Physical Exam  Constitutional: He is oriented to person, place, and time. He appears well-developed and well-nourished.  HENT:  Head: Normocephalic and atraumatic.  Eyes: Conjunctivae are normal.  Cardiovascular: Exam reveals no gallop and no friction rub.   No murmur heard. Respiratory: Effort normal and breath sounds normal. No respiratory distress. He has no wheezes.  GI: Soft. Bowel sounds are normal.  Musculoskeletal: Normal range of motion.  Has 1 cm sized wound on right right posterior lower leg with scab formation. No opening, no drainage, no erythema or fluctuance.   1+ pitting edema b/l lower legs.   No tenderness over L spine or paraspinal muscles.   Neurological: He is alert and oriented to person, place, and time.  str 5/5 on both legs but left leg is weaker than right.  Sensation intact.   Psychiatric: He  has a normal mood and affect.    Assessment & Plan:   See Encounters Tab for problem based charting.  Patient discussed with Dr. Lynnae January

## 2016-12-18 NOTE — Assessment & Plan Note (Signed)
Started zyrtec and flonase

## 2016-12-18 NOTE — Assessment & Plan Note (Signed)
Continues to have wheezing and SOB. Using albuterol 4 times a day, sx worsens at night time. Using dulera.  Ordered PFT last visit, he did not get it done yet. We added protonix to help with GERD in case it was playing a role, helping his GERd symptoms but not his COPD. Not taking anything for allergies.    - get PFT - flonase + zyrtec to help with allergic component - cont dulera and prn albuterol - if PFT confirms COPD, will benefit from anti-cholinergic agent.

## 2016-12-18 NOTE — Patient Instructions (Signed)
Take gabapentin 300 mg 3 tablets = 900 mg, three times daily  Use flonase and zyrtec for your allergies.  We will schedule your breathing test.  Continue your medications for now  Come back in 6 weeks for wound follow up

## 2016-12-19 ENCOUNTER — Telehealth: Payer: Self-pay

## 2016-12-19 NOTE — Telephone Encounter (Signed)
Needs to speak with a nurse about meds.  

## 2016-12-19 NOTE — Progress Notes (Signed)
Internal Medicine Clinic Attending  Case discussed with Dr. Ahmed at the time of the visit.  We reviewed the resident's history and exam and pertinent patient test results.  I agree with the assessment, diagnosis, and plan of care documented in the resident's note. 

## 2016-12-20 NOTE — Telephone Encounter (Signed)
Called pt back, no answers and does not have vmail setup

## 2016-12-25 NOTE — Telephone Encounter (Signed)
No answer

## 2016-12-27 NOTE — Telephone Encounter (Signed)
No answer

## 2016-12-28 ENCOUNTER — Other Ambulatory Visit: Payer: Self-pay | Admitting: *Deleted

## 2016-12-28 DIAGNOSIS — G4700 Insomnia, unspecified: Secondary | ICD-10-CM

## 2016-12-29 ENCOUNTER — Ambulatory Visit (HOSPITAL_COMMUNITY)
Admission: RE | Admit: 2016-12-29 | Discharge: 2016-12-29 | Disposition: A | Discharge status: Home / Self Care | Payer: Self-pay | Source: Ambulatory Visit | Attending: Internal Medicine | Admitting: Internal Medicine

## 2016-12-29 ENCOUNTER — Encounter (HOSPITAL_COMMUNITY): Payer: Self-pay

## 2016-12-29 DIAGNOSIS — J449 Chronic obstructive pulmonary disease, unspecified: Secondary | ICD-10-CM | POA: Insufficient documentation

## 2016-12-29 LAB — PULMONARY FUNCTION TEST
DL/VA % pred: 106 %
DL/VA: 5.26 ml/min/mmHg/L
DLCO UNC % PRED: 53 %
DLCO unc: 21.58 ml/min/mmHg
FEF 25-75 PRE: 1 L/s
FEF 25-75 Post: 2.73 L/sec
FEF2575-%Change-Post: 173 %
FEF2575-%Pred-Post: 70 %
FEF2575-%Pred-Pre: 25 %
FEV1-%CHANGE-POST: 35 %
FEV1-%PRED-POST: 50 %
FEV1-%PRED-PRE: 37 %
FEV1-Post: 2.06 L
FEV1-Pre: 1.52 L
FEV1FVC-%Change-Post: 13 %
FEV1FVC-%Pred-Pre: 85 %
FEV6-%Change-Post: 19 %
FEV6-%PRED-POST: 52 %
FEV6-%Pred-Pre: 44 %
FEV6-POST: 2.65 L
FEV6-Pre: 2.21 L
FEV6FVC-%Pred-Post: 103 %
FEV6FVC-%Pred-Pre: 103 %
FVC-%Change-Post: 19 %
FVC-%Pred-Post: 51 %
FVC-%Pred-Pre: 43 %
FVC-POST: 2.65 L
FVC-Pre: 2.21 L
PRE FEV6/FVC RATIO: 100 %
Post FEV1/FVC ratio: 78 %
Post FEV6/FVC ratio: 100 %
Pre FEV1/FVC ratio: 69 %
RV % PRED: 106 %
RV: 2.54 L
TLC % pred: 62 %
TLC: 5.09 L

## 2016-12-29 MED ORDER — ALBUTEROL SULFATE (2.5 MG/3ML) 0.083% IN NEBU
2.5000 mg | INHALATION_SOLUTION | Freq: Once | RESPIRATORY_TRACT | Status: AC
  Administered 2016-12-29: 2.5 mg via RESPIRATORY_TRACT

## 2016-12-31 MED ORDER — TRAZODONE HCL 100 MG PO TABS: 100.0000 mg | ORAL_TABLET | Freq: Every evening | ORAL | 2 refills | Status: DC | PRN

## 2017-01-15 ENCOUNTER — Emergency Department (HOSPITAL_COMMUNITY)
Admission: EM | Admit: 2017-01-15 | Discharge: 2017-01-16 | Discharge status: Against Medical Advice | Payer: Self-pay | Attending: Emergency Medicine | Admitting: Emergency Medicine

## 2017-01-15 ENCOUNTER — Emergency Department (HOSPITAL_COMMUNITY): Payer: Self-pay

## 2017-01-15 ENCOUNTER — Encounter (HOSPITAL_COMMUNITY): Payer: Self-pay | Admitting: Emergency Medicine

## 2017-01-15 DIAGNOSIS — M25512 Pain in left shoulder: Secondary | ICD-10-CM | POA: Insufficient documentation

## 2017-01-15 DIAGNOSIS — M79604 Pain in right leg: Secondary | ICD-10-CM | POA: Insufficient documentation

## 2017-01-15 DIAGNOSIS — Z5321 Procedure and treatment not carried out due to patient leaving prior to being seen by health care provider: Secondary | ICD-10-CM | POA: Insufficient documentation

## 2017-01-15 DIAGNOSIS — M79605 Pain in left leg: Secondary | ICD-10-CM | POA: Insufficient documentation

## 2017-01-15 NOTE — ED Notes (Signed)
No answer x2 for a room 

## 2017-01-15 NOTE — ED Notes (Signed)
No answer x1 for room.  Called x-ray and pt is not there either

## 2017-01-15 NOTE — ED Triage Notes (Signed)
Pt c/o 8/10 bilateral leg pain and shoulder pain for the past few weeks getting worse today, denies any injury.

## 2017-01-24 ENCOUNTER — Ambulatory Visit: Payer: Self-pay | Admitting: Pharmacist

## 2017-01-25 ENCOUNTER — Ambulatory Visit: Payer: Self-pay | Admitting: Pharmacist

## 2017-01-26 ENCOUNTER — Telehealth: Payer: Self-pay

## 2017-01-26 ENCOUNTER — Ambulatory Visit (INDEPENDENT_AMBULATORY_CARE_PROVIDER_SITE_OTHER): Payer: Self-pay | Admitting: Pharmacist

## 2017-01-26 DIAGNOSIS — I2699 Other pulmonary embolism without acute cor pulmonale: Secondary | ICD-10-CM

## 2017-01-26 DIAGNOSIS — I82511 Chronic embolism and thrombosis of right femoral vein: Secondary | ICD-10-CM

## 2017-01-26 DIAGNOSIS — Z9114 Patient's other noncompliance with medication regimen: Secondary | ICD-10-CM

## 2017-01-26 DIAGNOSIS — Z7901 Long term (current) use of anticoagulants: Secondary | ICD-10-CM

## 2017-01-26 LAB — POCT INR: INR: 1.3

## 2017-01-26 MED ORDER — WARFARIN SODIUM 5 MG PO TABS: ORAL_TABLET | ORAL | 3 refills | Status: DC

## 2017-01-26 NOTE — Patient Instructions (Signed)
Patient educated about medication as defined in this encounter and verbalized understanding by repeating back instructions provided.   

## 2017-01-26 NOTE — Progress Notes (Signed)
Anticoagulation Management [name] [name] is a 51 y.o. male who reports to the clinic for monitoring of warfarin treatment.    Indication: DVT and PE history, recurrent Duration: indefinite Supervising physician: Larey Dresser  Anticoagulation Clinic Visit History: Patient does not report signs/symptoms of bleeding or thromboembolism  Other recent changes: none, patient did state he missed 1 dose yesterday, but no other doses missed this week so will increase dose  Anticoagulation Episode Summary    Current INR goal:   2.0-3.0  TTR:   54.2 % (1.8 y)  Next INR check:   02/02/2017  INR from last check:   1.3! (01/26/2017)  Weekly max warfarin dose:     Target end date:     INR check location:     Preferred lab:     Send INR reminders to:   [facility]   Indications   Right leg DVT ([place]) [I82.401] Long-term (current) use of anticoagulants [Z79.01] DVT (Resolved) [I82.409] Noncompliance with medication regimen (Resolved) [Z91.14]       Comments:         Anticoagulation Care Providers    Provider Role Specialty Phone number   Bartholomew Crews, MD  Internal Medicine [phone]     ASSESSMENT Recent Results: Lab Results  Component Value Date   INR 1.3 01/26/2017   INR 2.6 11/28/2016   INR 2.4 09/22/2016   Anticoagulation Dosing: INR as of 01/26/2017 and Previous Warfarin Dosing Information    INR Dt INR Goal [name] Sun Mon Tue Wed Thu Fri Sat   01/26/2017 1.3 2.0-3.0 100 mg 12.5 mg 15 mg 15 mg 15 mg 15 mg 15 mg 12.5 mg    Anticoagulation Warfarin Dose Instructions as of 01/26/2017      Total Sun Mon Tue Wed Thu Fri Sat   New Dose 102.5 mg 12.5 mg 15 mg 15 mg 15 mg 15 mg 15 mg 15 mg     (5 mg x 2.5)  (5 mg x 3)  (5 mg x 3)  (5 mg x 3)  (5 mg x 3)  (5 mg x 3)  (5 mg x 3)                           INR today: Subtherapeutic  PLAN Weekly dose was increased to 102.5 mg per week  Patient Instructions  Patient educated about medication as defined in  this encounter and verbalized understanding by repeating back instructions provided.   Patient advised to contact clinic or seek medical attention if signs/symptoms of bleeding or thromboembolism occur.  Patient verbalized understanding by repeating back information and was advised to contact me if further medication-related questions arise. Patient was also provided an information handout.  Follow-up Return in about 1 week (around 02/02/2017).  Flossie Dibble

## 2017-01-30 ENCOUNTER — Telehealth: Payer: Self-pay | Admitting: *Deleted

## 2017-01-30 NOTE — Telephone Encounter (Signed)
Noted, will continue to monitor 

## 2017-01-30 NOTE — Telephone Encounter (Signed)
[facility] pharmacist calls to discuss warfarin and duloxetine use, transfer to dr Maudie Mercury

## 2017-02-02 ENCOUNTER — Ambulatory Visit (INDEPENDENT_AMBULATORY_CARE_PROVIDER_SITE_OTHER): Payer: Self-pay | Admitting: Pharmacist

## 2017-02-02 DIAGNOSIS — I82511 Chronic embolism and thrombosis of right femoral vein: Secondary | ICD-10-CM

## 2017-02-02 DIAGNOSIS — Z7901 Long term (current) use of anticoagulants: Secondary | ICD-10-CM

## 2017-02-02 LAB — POCT INR: INR: 2.1

## 2017-02-02 NOTE — Progress Notes (Signed)
Anticoagulation Management Arseniy A Clappis a 51 y.o.malewho reports to the clinic for monitoring of warfarintreatment.   Indication: DVTand PE history, recurrent Duration: indefinite Supervising physician: Lalla Brothers  Anticoagulation Clinic Visit History: Patient does not report signs/symptoms of bleeding or thromboembolism   Anticoagulation Episode Summary    Current INR goal:   2.0-3.0  TTR:   53.8 % (1.8 y)  Next INR check:   02/27/2017  INR from last check:   2.1 (02/02/2017)  Weekly max warfarin dose:     Target end date:     INR check location:     Preferred lab:     Send INR reminders to:   ANT[facility]   Indications   Right leg DVT ([place]) [I82.401] Long-term (current) use of anticoagulants [Z79.01] DVT (Resolved) [I82.409] Noncompliance with medication regimen (Resolved) [Z91.14]       Comments:         Anticoagulation Care Providers    Provider Role Specialty Phone number   Bartholomew Crews, MD  Internal Medicine [phone]     ASSESSMENT Recent Results: Lab Results  Component Value Date   INR 2.1 02/02/2017   INR 1.3 01/26/2017   INR 2.6 11/28/2016   Anticoagulation Dosing: INR as of 02/02/2017 and Previous Warfarin Dosing Information    INR Dt INR Goal Wkly Tot Sun Mon Tue Wed Thu Fri Sat   02/02/2017 2.1 2.0-3.0 102.5 mg 15 mg 15 mg 15 mg 15 mg 15 mg 15 mg 12.5 mg   Patient deviated from recommended dosing.       Anticoagulation Warfarin Dose Instructions as of 02/02/2017      Total Sun Mon Tue Wed Thu Fri Sat   New Dose 102.5 mg 15 mg 15 mg 15 mg 15 mg 15 mg 15 mg 12.5 mg     (5 mg x 3)  (5 mg x 3)  (5 mg x 3)  (5 mg x 3)  (5 mg x 3)  (5 mg x 3)  (5 mg x 2.5)                           INR today: Therapeutic  PLAN Weekly dose was unchanged   Patient Instructions  Patient educated about medication as defined in this encounter and verbalized understanding by repeating back instructions provided.   Patient advised to  contact clinic or seek medical attention if signs/symptoms of bleeding or thromboembolism occur.  Patient verbalized understanding by repeating back information and was advised to contact me if further medication-related questions arise. Patient was also provided an information handout.  Follow-up Return in about 4 weeks (around 02/27/2017).  Flossie Dibble

## 2017-02-02 NOTE — Progress Notes (Signed)
INTERNAL MEDICINE TEACHING ATTENDING ADDENDUM ° °I agree with pharmacy recommendations as outlined in their note.  ° °-Maryama Kuriakose MD ° °

## 2017-02-02 NOTE — Patient Instructions (Signed)
Patient educated about medication as defined in this encounter and verbalized understanding by repeating back instructions provided.   

## 2017-02-08 ENCOUNTER — Emergency Department (HOSPITAL_COMMUNITY): Payer: Self-pay

## 2017-02-08 ENCOUNTER — Emergency Department (HOSPITAL_COMMUNITY)
Admission: EM | Admit: 2017-02-08 | Discharge: 2017-02-08 | Disposition: A | Discharge status: Home / Self Care | Payer: Self-pay | Attending: Emergency Medicine | Admitting: Emergency Medicine

## 2017-02-08 ENCOUNTER — Encounter (HOSPITAL_COMMUNITY): Payer: Self-pay | Admitting: Emergency Medicine

## 2017-02-08 DIAGNOSIS — Z79899 Other long term (current) drug therapy: Secondary | ICD-10-CM | POA: Insufficient documentation

## 2017-02-08 DIAGNOSIS — F17210 Nicotine dependence, cigarettes, uncomplicated: Secondary | ICD-10-CM | POA: Insufficient documentation

## 2017-02-08 DIAGNOSIS — R071 Chest pain on breathing: Secondary | ICD-10-CM | POA: Insufficient documentation

## 2017-02-08 DIAGNOSIS — F319 Bipolar disorder, unspecified: Secondary | ICD-10-CM | POA: Insufficient documentation

## 2017-02-08 DIAGNOSIS — J45909 Unspecified asthma, uncomplicated: Secondary | ICD-10-CM | POA: Insufficient documentation

## 2017-02-08 DIAGNOSIS — J449 Chronic obstructive pulmonary disease, unspecified: Secondary | ICD-10-CM | POA: Insufficient documentation

## 2017-02-08 DIAGNOSIS — Z7901 Long term (current) use of anticoagulants: Secondary | ICD-10-CM | POA: Insufficient documentation

## 2017-02-08 DIAGNOSIS — I5032 Chronic diastolic (congestive) heart failure: Secondary | ICD-10-CM | POA: Insufficient documentation

## 2017-02-08 DIAGNOSIS — R0781 Pleurodynia: Secondary | ICD-10-CM

## 2017-02-08 LAB — BASIC METABOLIC PANEL
ANION GAP: 14 (ref 5–15)
BUN: 9 mg/dL (ref 6–20)
CALCIUM: 9.4 mg/dL (ref 8.9–10.3)
CO2: 22 mmol/L (ref 22–32)
Chloride: 102 mmol/L (ref 101–111)
Creatinine, Ser: 1.22 mg/dL (ref 0.61–1.24)
GLUCOSE: 112 mg/dL — AB (ref 65–99)
Potassium: 3.6 mmol/L (ref 3.5–5.1)
Sodium: 138 mmol/L (ref 135–145)

## 2017-02-08 LAB — CBC
HCT: 47.4 % (ref 39.0–52.0)
HEMOGLOBIN: 16.2 g/dL (ref 13.0–17.0)
MCH: 31.5 pg (ref 26.0–34.0)
MCHC: 34.2 g/dL (ref 30.0–36.0)
MCV: 92.2 fL (ref 78.0–100.0)
Platelets: 219 10*3/uL (ref 150–400)
RBC: 5.14 MIL/uL (ref 4.22–5.81)
RDW: 13.5 % (ref 11.5–15.5)
WBC: 6.9 10*3/uL (ref 4.0–10.5)

## 2017-02-08 LAB — I-STAT TROPONIN, ED: TROPONIN I, POC: 0 ng/mL (ref 0.00–0.08)

## 2017-02-08 MED ORDER — IBUPROFEN 600 MG PO TABS: 600.0000 mg | ORAL_TABLET | Freq: Three times a day (TID) | ORAL | 0 refills | Status: DC | PRN

## 2017-02-08 MED ORDER — IOPAMIDOL (ISOVUE-370) INJECTION 76%
INTRAVENOUS | Status: AC
  Filled 2017-02-08: qty 100

## 2017-02-08 MED ORDER — IOPAMIDOL (ISOVUE-370) INJECTION 76%
100.0000 mL | Freq: Once | INTRAVENOUS | Status: AC | PRN
  Administered 2017-02-08: 100 mL via INTRAVENOUS

## 2017-02-08 MED ORDER — HYDROMORPHONE HCL 1 MG/ML IJ SOLN
1.0000 mg | Freq: Once | INTRAMUSCULAR | Status: AC
  Administered 2017-02-08: 1 mg via INTRAVENOUS
  Filled 2017-02-08: qty 1

## 2017-02-08 MED ORDER — ACETAMINOPHEN 325 MG PO TABS: 650.0000 mg | ORAL_TABLET | Freq: Four times a day (QID) | ORAL | 0 refills | Status: DC | PRN

## 2017-02-08 NOTE — ED Notes (Signed)
Patient transported to CT 

## 2017-02-08 NOTE — ED Triage Notes (Signed)
Pt states "it hurts when I breathe." Pt also complains of chest tightness. Last time he felt like this, states he has PE.
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
## 2017-02-26 ENCOUNTER — Ambulatory Visit: Payer: Self-pay | Admitting: Pharmacist

## 2017-02-27 ENCOUNTER — Ambulatory Visit: Payer: Self-pay

## 2017-02-27 ENCOUNTER — Ambulatory Visit (INDEPENDENT_AMBULATORY_CARE_PROVIDER_SITE_OTHER): Payer: Self-pay | Admitting: Pharmacist

## 2017-02-27 DIAGNOSIS — I82511 Chronic embolism and thrombosis of right femoral vein: Secondary | ICD-10-CM

## 2017-02-27 DIAGNOSIS — Z7901 Long term (current) use of anticoagulants: Secondary | ICD-10-CM

## 2017-02-27 LAB — POCT INR: INR: 3
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
## 2017-02-27 NOTE — Patient Instructions (Signed)
Patient educated about medication as defined in this encounter and verbalized understanding by repeating back instructions provided.   

## 2017-03-15 ENCOUNTER — Other Ambulatory Visit: Payer: Self-pay | Admitting: *Deleted

## 2017-03-15 DIAGNOSIS — I872 Venous insufficiency (chronic) (peripheral): Secondary | ICD-10-CM

## 2017-03-15 DIAGNOSIS — J45909 Unspecified asthma, uncomplicated: Secondary | ICD-10-CM

## 2017-03-16 MED ORDER — ALBUTEROL SULFATE HFA 108 (90 BASE) MCG/ACT IN AERS: 2.0000 | INHALATION_SPRAY | Freq: Four times a day (QID) | RESPIRATORY_TRACT | 6 refills | Status: DC | PRN

## 2017-03-16 MED ORDER — ALBUTEROL SULFATE (2.5 MG/3ML) 0.083% IN NEBU: 2.5000 mg | INHALATION_SOLUTION | Freq: Four times a day (QID) | RESPIRATORY_TRACT | 6 refills | Status: DC | PRN

## 2017-03-16 MED ORDER — FUROSEMIDE 40 MG PO TABS: 40.0000 mg | ORAL_TABLET | Freq: Every day | ORAL | 0 refills | Status: DC

## 2017-04-10 ENCOUNTER — Other Ambulatory Visit: Payer: Self-pay | Admitting: Pharmacist

## 2017-04-10 ENCOUNTER — Ambulatory Visit (INDEPENDENT_AMBULATORY_CARE_PROVIDER_SITE_OTHER): Payer: Self-pay | Admitting: Pharmacist

## 2017-04-10 DIAGNOSIS — J449 Chronic obstructive pulmonary disease, unspecified: Secondary | ICD-10-CM

## 2017-04-10 DIAGNOSIS — I82511 Chronic embolism and thrombosis of right femoral vein: Secondary | ICD-10-CM

## 2017-04-10 DIAGNOSIS — Z7901 Long term (current) use of anticoagulants: Secondary | ICD-10-CM

## 2017-04-10 LAB — POCT INR: INR: 2.9

## 2017-04-10 MED ORDER — MOMETASONE FURO-FORMOTEROL FUM 200-5 MCG/ACT IN AERO: 2.0000 | INHALATION_SPRAY | Freq: Two times a day (BID) | RESPIRATORY_TRACT | 3 refills | Status: DC

## 2017-04-10 NOTE — Patient Instructions (Signed)
Patient educated about medication as defined in this encounter and verbalized understanding by repeating back instructions provided.   
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
## 2017-04-17 ENCOUNTER — Other Ambulatory Visit: Payer: Self-pay | Admitting: *Deleted

## 2017-04-17 DIAGNOSIS — G4700 Insomnia, unspecified: Secondary | ICD-10-CM

## 2017-04-18 ENCOUNTER — Other Ambulatory Visit: Payer: Self-pay | Admitting: *Deleted

## 2017-04-18 DIAGNOSIS — M5416 Radiculopathy, lumbar region: Secondary | ICD-10-CM

## 2017-04-18 MED ORDER — CETIRIZINE HCL 10 MG PO TABS: 10.0000 mg | ORAL_TABLET | Freq: Every day | ORAL | 2 refills | Status: DC

## 2017-04-18 MED ORDER — GABAPENTIN 300 MG PO CAPS: 900.0000 mg | ORAL_CAPSULE | Freq: Three times a day (TID) | ORAL | 2 refills | Status: DC

## 2017-05-30 ENCOUNTER — Ambulatory Visit: Payer: Self-pay

## 2017-06-05 ENCOUNTER — Ambulatory Visit (INDEPENDENT_AMBULATORY_CARE_PROVIDER_SITE_OTHER): Payer: Self-pay | Admitting: Pharmacist

## 2017-06-05 DIAGNOSIS — Z7901 Long term (current) use of anticoagulants: Secondary | ICD-10-CM

## 2017-06-05 DIAGNOSIS — Z86718 Personal history of other venous thrombosis and embolism: Secondary | ICD-10-CM

## 2017-06-05 DIAGNOSIS — I82511 Chronic embolism and thrombosis of right femoral vein: Secondary | ICD-10-CM

## 2017-06-05 DIAGNOSIS — Z86711 Personal history of pulmonary embolism: Secondary | ICD-10-CM

## 2017-06-06 LAB — POCT INR: INR: 1.9

## 2017-06-06 NOTE — Progress Notes (Signed)
Anticoagulation Management [name] A Clappis a 51 y.o.malewho reports to the clinic for monitoring of warfarintreatment.   Indication: DVTand PE history, recurrent Duration: indefinite Supervising physician: Larey Dresser  Anticoagulation Clinic Visit History: Patient does notreport signs/symptoms of bleeding or thromboembolism   Anticoagulation Episode Summary    Current INR goal:   2.0-3.0  TTR:   60.4 % (2.2 y)  Next INR check:   06/18/2017  INR from last check:   2.9 (04/10/2017)  Most recent INR:    1.9! (06/06/2017)  Weekly max warfarin dose:     Target end date:     INR check location:     Preferred lab:     Send INR reminders to:   ANTICOAG IMP   Indications   Right leg DVT ([place]) [I82.401] Long-term (current) use of anticoagulants [Z79.01] DVT (Resolved) [I82.409] Noncompliance with medication regimen (Resolved) [Z91.14]       Comments:         Anticoagulation Care Providers    Provider Role Specialty Phone number   Bartholomew Crews, MD  Internal Medicine [phone]     Allergies  Allergen Reactions  . Xarelto [Rivaroxaban] Dermatitis    Blisters skin  . Clindamycin/Lincomycin Dermatitis    Severe rash/dermatitis from November 2015 still present Jan 2016 from clinda   Medication Sig  acetaminophen (TYLENOL) 325 MG tablet Take 2 tablets (650 mg total) by mouth every 6 (six) hours as needed.  albuterol (PROVENTIL HFA;VENTOLIN HFA) 108 (90 Base) MCG/ACT inhaler Inhale 2 puffs into the lungs every 6 (six) hours as needed for wheezing or shortness of breath.  albuterol (PROVENTIL) (2.5 MG/3ML) 0.083% nebulizer solution Take 3 mLs (2.5 mg total) by nebulization every 6 (six) hours as needed for wheezing or shortness of breath.  cetirizine (ZYRTEC ALLERGY) 10 MG tablet Take 1 tablet (10 mg total) by mouth daily.  diclofenac sodium (VOLTAREN) 1 % GEL Apply 2 g topically 4 (four) times daily. Patient taking differently: Apply 2 g topically 4 (four)  times daily as needed (pain).   DULoxetine (CYMBALTA) 60 MG capsule Take 1 capsule (60 mg total) by mouth daily. IM program  fluticasone (FLONASE) 50 MCG/ACT nasal spray Place 1 spray into both nostrils daily. Patient not taking: Reported on 02/08/2017  furosemide (LASIX) 40 MG tablet Take 1 tablet (40 mg total) by mouth daily.  gabapentin (NEURONTIN) 300 MG capsule Take 3 capsules (900 mg total) by mouth 3 (three) times daily.  hydrocerin (EUCERIN) CREA Apply 1 application topically daily.  mometasone-formoterol (DULERA) 200-5 MCG/ACT AERO Inhale 2 puffs into the lungs 2 (two) times daily.  nicotine (NICOTROL) 10 MG inhaler Inhale 1 cartridge (puff for 20 minutes) as needed. Use at least 6 cartridges/day the first 3-6 weeks, reduce gradually over 12 weeks  pantoprazole (PROTONIX) 40 MG tablet Take 1 tablet (40 mg total) by mouth daily.  traZODone (DESYREL) 100 MG tablet Take 1 tablet (100 mg total) by mouth at bedtime as needed for sleep.  warfarin (COUMADIN) 5 MG tablet Take 3 tablets (15mg ) by mouth daily, except 2 and 1/2 tablets (12.5mg ) Sundays.   Past Medical History:  Diagnosis Date  . Alcohol abuse   . Arthritis    knees  . Asthma    uses Albuterol daily as needed  . Bipolar 1 disorder ([place])   . Bronchitis    last time 2 yrs ago  . Cellulitis   . Chronic dermatitis    due to Xarelto Rx  . Cough    smokers  .  Depression   . DVT (deep venous thrombosis) ([facility])    on Coumadin Rx  . DVT (deep venous thrombosis) ([place])   . GERD (gastroesophageal reflux disease)    only takes something about 2 times a yr  . GERD (gastroesophageal reflux disease)   . Heart murmur   . Joint pain   . Pulmonary embolism ([place]) 2014  . Shortness of breath   . Substance abuse    crack, cocaine, last use 2007  . Tobacco abuse   . Tuberculosis    ' I test Positive "   Social History   Socioeconomic History  . Marital status: Single    Spouse name: Not on file  . Number of children: Not on file   . Years of education: Not on file  . Highest education level: Not on file  Social Needs  . Financial resource strain: Not on file  . Food insecurity - worry: Not on file  . Food insecurity - inability: Not on file  . Transportation needs - medical: Not on file  . Transportation needs - non-medical: Not on file  Occupational History  . Occupation: Dentist: UNEMPLOYED  Tobacco Use  . Smoking status: Current Every Day Smoker    Packs/day: 0.20    Types: Cigarettes  . Smokeless tobacco: Former Systems developer  . Tobacco comment: "stopped chewing in 1990s" 2 cigs per day. Quitting  Substance and Sexual Activity  . Alcohol use: No  . Drug use: No    Comment: last use 2016  . Sexual activity: Yes    Partners: Female  Other Topics Concern  . Not on file  Social History Narrative   Working at [employer] now unemployed.  States he has insurance through them now.    Lives with mom and step dad          Family History  Problem Relation Age of Onset  . Asthma Mother   . Throat cancer Father    ASSESSMENT Recent Results: Lab Results  Component Value Date   INR 1.9 06/06/2017   INR 2.9 04/10/2017   INR 3 02/27/2017   Anticoagulation Dosing: 102.5 mg weekly   INR today: Therapeutic  PLAN Weekly dose was unchanged  Patient Instructions  Patient educated about medication as defined in this encounter and verbalized understanding by repeating back instructions provided.    Patient advised to contact clinic or seek medical attention if signs/symptoms of bleeding or thromboembolism occur.  Patient verbalized understanding by repeating back information and was advised to contact me if further medication-related questions arise. Patient was also provided an information handout.  Follow-up Return in about 3 weeks (around 06/26/2017).  Flossie Dibble

## 2017-06-06 NOTE — Patient Instructions (Signed)
Patient educated about medication as defined in this encounter and verbalized understanding by repeating back instructions provided.   

## 2017-06-18 ENCOUNTER — Ambulatory Visit: Payer: Self-pay | Admitting: Pharmacist

## 2017-06-18 ENCOUNTER — Encounter: Payer: Self-pay | Admitting: Internal Medicine

## 2017-06-19 ENCOUNTER — Ambulatory Visit: Payer: Self-pay | Admitting: Pharmacist

## 2017-06-22 ENCOUNTER — Ambulatory Visit (INDEPENDENT_AMBULATORY_CARE_PROVIDER_SITE_OTHER): Payer: Self-pay | Admitting: Pharmacist

## 2017-06-22 DIAGNOSIS — Z7901 Long term (current) use of anticoagulants: Secondary | ICD-10-CM

## 2017-06-22 DIAGNOSIS — I2699 Other pulmonary embolism without acute cor pulmonale: Secondary | ICD-10-CM

## 2017-06-22 DIAGNOSIS — I82511 Chronic embolism and thrombosis of right femoral vein: Secondary | ICD-10-CM

## 2017-06-22 LAB — POCT INR: INR: 1.2

## 2017-06-22 MED ORDER — WARFARIN SODIUM 5 MG PO TABS: ORAL_TABLET | ORAL | 0 refills | Status: DC

## 2017-06-22 MED FILL — WARFARIN SODIUM 5 MG TABLET: 5 | 30 days supply | Qty: 88 | Fill #0

## 2017-06-25 NOTE — Progress Notes (Signed)
Anticoagulation Management [name] [name] a 51 y.o.malewho reports to the clinic for monitoring of warfarintreatment.   Indication: DVTand PE history, recurrent Duration: indefinite Supervising physician: Murriel Hopper  Anticoagulation Clinic Visit History: Patient does notreport signs/symptoms of bleeding or thromboembolism. Patient states he ran out of warfarin and has not taken in almost 1 week.  Anticoagulation Episode Summary    Current INR goal:   2.0-3.0  TTR:   59.2 % (2.2 y)  Next INR check:   06/29/2017  INR from last check:   1.2! (06/22/2017)  Weekly max warfarin dose:     Target end date:     INR check location:     Preferred lab:     Send INR reminders to:   ANTICOAG IMP   Indications   Right leg DVT ([place]) [I82.401] Long-term (current) use of anticoagulants [Z79.01] DVT (Resolved) [I82.409] Noncompliance with medication regimen (Resolved) [Z91.14]       Comments:         Anticoagulation Care Providers    Provider Role Specialty Phone number   Bartholomew Crews, MD  Internal Medicine [phone]     Allergies  Allergen Reactions  . Xarelto [Rivaroxaban] Dermatitis    Blisters skin  . Clindamycin/Lincomycin Dermatitis    Severe rash/dermatitis from November 2015 still present Jan 2016 from clinda   Medication Sig  acetaminophen (TYLENOL) 325 MG tablet Take 2 tablets (650 mg total) by mouth every 6 (six) hours as needed.  albuterol (PROVENTIL HFA;VENTOLIN HFA) 108 (90 Base) MCG/ACT inhaler Inhale 2 puffs into the lungs every 6 (six) hours as needed for wheezing or shortness of breath.  albuterol (PROVENTIL) (2.5 MG/3ML) 0.083% nebulizer solution Take 3 mLs (2.5 mg total) by nebulization every 6 (six) hours as needed for wheezing or shortness of breath.  cetirizine (ZYRTEC ALLERGY) 10 MG tablet Take 1 tablet (10 mg total) by mouth daily.  diclofenac sodium (VOLTAREN) 1 % GEL Apply 2 g topically 4 (four) times daily. Patient taking  differently: Apply 2 g topically 4 (four) times daily as needed (pain).   DULoxetine (CYMBALTA) 60 MG capsule Take 1 capsule (60 mg total) by mouth daily. IM program  fluticasone (FLONASE) 50 MCG/ACT nasal spray Place 1 spray into both nostrils daily. Patient not taking: Reported on 02/08/2017  furosemide (LASIX) 40 MG tablet Take 1 tablet (40 mg total) by mouth daily.  gabapentin (NEURONTIN) 300 MG capsule Take 3 capsules (900 mg total) by mouth 3 (three) times daily.  hydrocerin (EUCERIN) CREA Apply 1 application topically daily.  mometasone-formoterol (DULERA) 200-5 MCG/ACT AERO Inhale 2 puffs into the lungs 2 (two) times daily.  nicotine (NICOTROL) 10 MG inhaler Inhale 1 cartridge (puff for 20 minutes) as needed. Use at least 6 cartridges/day the first 3-6 weeks, reduce gradually over 12 weeks  pantoprazole (PROTONIX) 40 MG tablet Take 1 tablet (40 mg total) by mouth daily.  traZODone (DESYREL) 100 MG tablet Take 1 tablet (100 mg total) by mouth at bedtime as needed for sleep.  warfarin (COUMADIN) 5 MG tablet Take 3 tablets (15mg ) by mouth daily, except 2 and 1/2 tablets (12.5mg ) Sundays. IM program   Past Medical History:  Diagnosis Date  . Alcohol abuse   . Arthritis    knees  . Asthma    uses Albuterol daily as needed  . Bipolar 1 disorder ([place])   . Bronchitis    last time 2 yrs ago  . Cellulitis   . Chronic dermatitis    due to Xarelto Rx  .  Cough    smokers  . Depression   . DVT (deep venous thrombosis) ([facility])    on Coumadin Rx  . DVT (deep venous thrombosis) ([place])   . GERD (gastroesophageal reflux disease)    only takes something about 2 times a yr  . GERD (gastroesophageal reflux disease)   . Heart murmur   . Joint pain   . Pulmonary embolism ([place]) 2014  . Shortness of breath   . Substance abuse    crack, cocaine, last use 2007  . Tobacco abuse   . Tuberculosis    ' I test Positive "   Social History   Socioeconomic History  . Marital status: Single    Spouse  name: Not on file  . Number of children: Not on file  . Years of education: Not on file  . Highest education level: Not on file  Social Needs  . Financial resource strain: Not on file  . Food insecurity - worry: Not on file  . Food insecurity - inability: Not on file  . Transportation needs - medical: Not on file  . Transportation needs - non-medical: Not on file  Occupational History  . Occupation: Dentist: UNEMPLOYED  Tobacco Use  . Smoking status: Current Every Day Smoker    Packs/day: 0.20    Types: Cigarettes  . Smokeless tobacco: Former Systems developer  . Tobacco comment: "stopped chewing in 1990s" 2 cigs per day. Quitting  Substance and Sexual Activity  . Alcohol use: No  . Drug use: No    Comment: last use 2016  . Sexual activity: Yes    Partners: Female  Other Topics Concern  . Not on file  Social History Narrative   Working at [employer] now unemployed.  States he has insurance through them now.    Lives with mom and step dad          Family History  Problem Relation Age of Onset  . Asthma Mother   . Throat cancer Father    ASSESSMENT Recent Results: Lab Results  Component Value Date   INR 1.2 06/22/2017   INR 1.9 06/06/2017   INR 2.9 04/10/2017   Anticoagulation Dosing: 102.5 mg/weekly   INR today: Therapeutic  PLAN Weekly dose was unchanged, patient was re-started on warfarin and educated on importance of adherence.  Patient Instructions  Patient educated about medication as defined in this encounter and verbalized understanding by repeating back instructions provided.   Patient advised to contact clinic or seek medical attention if signs/symptoms of bleeding or thromboembolism occur.  Patient verbalized understanding by repeating back information and was advised to contact me if further medication-related questions arise. Patient was also provided an information handout.  Follow-up Return in about 1 week (around  06/29/2017).  Flossie Dibble

## 2017-06-25 NOTE — Patient Instructions (Signed)
Patient educated about medication as defined in this encounter and verbalized understanding by repeating back instructions provided.   

## 2017-06-26 ENCOUNTER — Ambulatory Visit (INDEPENDENT_AMBULATORY_CARE_PROVIDER_SITE_OTHER): Payer: Self-pay | Admitting: Internal Medicine

## 2017-06-26 ENCOUNTER — Encounter: Payer: Self-pay | Admitting: Internal Medicine

## 2017-06-26 VITALS — BP 126/90 | HR 77 | Temp 97.6°F | Wt 257.6 lb

## 2017-06-26 DIAGNOSIS — Z86718 Personal history of other venous thrombosis and embolism: Secondary | ICD-10-CM

## 2017-06-26 DIAGNOSIS — M25561 Pain in right knee: Secondary | ICD-10-CM

## 2017-06-26 DIAGNOSIS — L814 Other melanin hyperpigmentation: Secondary | ICD-10-CM

## 2017-06-26 DIAGNOSIS — F329 Major depressive disorder, single episode, unspecified: Secondary | ICD-10-CM

## 2017-06-26 DIAGNOSIS — Z7901 Long term (current) use of anticoagulants: Secondary | ICD-10-CM

## 2017-06-26 MED ORDER — IBUPROFEN 800 MG PO TABS: 800.0000 mg | ORAL_TABLET | Freq: Three times a day (TID) | ORAL | 3 refills | Status: DC | PRN

## 2017-06-26 MED FILL — IBUPROFEN 800 MG TABS: 800 | 10 days supply | Qty: 30 | Fill #0

## 2017-06-26 NOTE — Assessment & Plan Note (Signed)
Patient reports R knee pain which started about 2 months ago. Patient has most pain when bending down to pick up things, however patient able to bear weight and ambulate without difficulty (uses cane at baseline). Patient denies any trauma to the area, fevers, and swelling. PE is reassuring and does not reveal any joint effusions concerning for infection or gout. There is no laxity or abnormal ROM to suggest need for imaging at this time. The decision to defer imaging at this time is supported by the patient's ability to bear weight and ambulate as usual. Per chart review, patient has old imaging of R knee after a fall which occurred 1 year ago. Minimal early OA shown on this imaging. While there is no crepitus on exam, this may be the cause of the patient's new onset daily knee pain. Patient instructed to start taking ibuprofen 800 mg TID PRN for pain and to return to clinic on January 2 or 3 for reevaluation. At that follow up visit, would suggest ultrasound evaluation for effusion and/or joint injection if patient does not report relief with NSAID treatment. Patient may also benefit from referral to sports medicine at that time.   Plan: -Ibuprofen 800 mg TID PRN -RTC on 07/11/2017 or 07/12/2017 for reevaluation, suggest ultrasound and/or joint injection at that time -Consider sports medicine/PT evaluation or further imaging

## 2017-06-26 NOTE — Progress Notes (Signed)
   CC: R knee pain  HPI:  [name] [name] is a 51 y.o. PMH of DVT on coumadin and depression who is presenting for evaluation of a 2 month history of R knee pain. Patient does not report any trauma or inciting incident that precipitated pain. It is rated as a 7-8/10. Localized mainly to the medial aspect of his R knee. Occurs mainly when squatting down. Patient able to bear weight and walk with cane as per usual in spite of this R knee pain. Patient has not noticed any swelling to the area, bruising, fevers, or erythema.   PMH: Past Medical History:  Diagnosis Date  . Alcohol abuse   . Arthritis    knees  . Asthma    uses Albuterol daily as needed  . Bipolar 1 disorder ([place])   . Bronchitis    last time 2 yrs ago  . Cellulitis   . Chronic dermatitis    due to Xarelto Rx  . Cough    smokers  . Depression   . DVT (deep venous thrombosis) ([facility])    on Coumadin Rx  . DVT (deep venous thrombosis) ([place])   . GERD (gastroesophageal reflux disease)    only takes something about 2 times a yr  . GERD (gastroesophageal reflux disease)   . Heart murmur   . Joint pain   . Pulmonary embolism ([place]) 2014  . Shortness of breath   . Substance abuse    crack, cocaine, last use 2007  . Tobacco abuse   . Tuberculosis    ' I test Positive "   Review of Systems:  Patient endorses R knee pain, as per HPI Patient denies chest pain, shortness of breath, abdominal pain, diaphoresis, nausea/vomiting, lower extremity swelling, and change in bowel/bladder habits.  Physical Exam:  Vitals:   06/26/17 0956  BP: 126/90  Pulse: 77  Temp: 97.6 F (36.4 C)  TempSrc: Oral  SpO2: 100%  Weight: 257 lb 9.6 oz (116.8 kg)   Physical Exam  Constitutional: He appears well-developed and well-nourished. No distress.  Cardiovascular: Normal rate, regular rhythm and intact distal pulses. Exam reveals no friction rub.  No murmur heard. Respiratory: Effort normal. No respiratory distress. He has no wheezes.    Musculoskeletal: He exhibits edema (of RLE (trace)) and tenderness (with palpation of RLE).  No effusions, erythema, or swelling of R knee compared to L. ROM of R knee within normal limits. No crepitus noted on exam. Some pain with ROM testing, mainly localized to medial aspect of R knee.  Skin: He is not diaphoretic.  Chronic hyperpigmentation of skin on RLE below knee, unchanged for many years per patient report. No signs of ulceration or skin breakdown.     Assessment & Plan:   See Encounters Tab for problem based charting.  Patient seen with Dr. Lynnae January.

## 2017-06-26 NOTE — Patient Instructions (Signed)
FOLLOW-UP INSTRUCTIONS When: January 2 or 3 For: Knee pain evaluation, possible joint injection or sports med referral  Thank you for seeing [name] in the clinic today!  You were evaluated for right knee pain. You do not have any signs or symptoms of infection in your knee causing your pain. We think the pain is related to arthritis. Please take the anti-inflammatory medication, Ibuprofen. Take one 800 mg tablet every 8 hours as needed per day for pain. Please take this medicine with food as it can irritate the stomach.   Please return to the clinic in on January 2 or 3 for follow up of your right knee pain.  If you have any questions or concerns, please call our clinic at [phone] between the hours of 9am-5pm. If you have a problem after these hours, please call [phone] and ask for the internal medicine resident on call. If you feel you are having a medical emergency please call 911.   Thanks, Dr. Larena Glassman [place]

## 2017-06-28 NOTE — Addendum Note (Signed)
Addended by: [name], [name] on: 06/28/2017 03:42 PM   Modules accepted: Level of Service  

## 2017-06-28 NOTE — Progress Notes (Signed)
Internal Medicine Clinic Attending  I saw and evaluated the patient.  I personally confirmed the key portions of the history and exam documented by Dr. Nedrud and I reviewed pertinent patient test results.  The assessment, diagnosis, and plan were formulated together and I agree with the documentation in the resident's note.  

## 2017-06-29 ENCOUNTER — Ambulatory Visit: Payer: Self-pay | Admitting: Pharmacist

## 2017-07-11 ENCOUNTER — Encounter: Payer: Self-pay | Admitting: Internal Medicine

## 2017-07-11 ENCOUNTER — Other Ambulatory Visit: Payer: Self-pay

## 2017-07-11 ENCOUNTER — Ambulatory Visit (HOSPITAL_COMMUNITY)
Admission: RE | Admit: 2017-07-11 | Discharge: 2017-07-11 | Disposition: A | Discharge status: Home / Self Care | Payer: Self-pay | Source: Ambulatory Visit | Attending: Internal Medicine | Admitting: Internal Medicine

## 2017-07-11 ENCOUNTER — Ambulatory Visit (INDEPENDENT_AMBULATORY_CARE_PROVIDER_SITE_OTHER): Payer: Self-pay | Admitting: Internal Medicine

## 2017-07-11 VITALS — BP 121/88 | HR 77 | Temp 97.8°F | Ht 76.0 in | Wt 257.6 lb

## 2017-07-11 DIAGNOSIS — Z791 Long term (current) use of non-steroidal anti-inflammatories (NSAID): Secondary | ICD-10-CM

## 2017-07-11 DIAGNOSIS — M25561 Pain in right knee: Secondary | ICD-10-CM

## 2017-07-11 DIAGNOSIS — K219 Gastro-esophageal reflux disease without esophagitis: Secondary | ICD-10-CM | POA: Insufficient documentation

## 2017-07-11 MED ORDER — PANTOPRAZOLE SODIUM 40 MG PO TBEC: 40.0000 mg | DELAYED_RELEASE_TABLET | Freq: Two times a day (BID) | ORAL | 2 refills | Status: DC

## 2017-07-11 MED ORDER — PANTOPRAZOLE SODIUM 40 MG PO TBEC: 40.0000 mg | DELAYED_RELEASE_TABLET | Freq: Two times a day (BID) | ORAL | 1 refills | Status: DC

## 2017-07-11 NOTE — Assessment & Plan Note (Addendum)
Patient returns today for reevaluation of right knee pain which started about 3 months ago.  He was seen 12/18 for this pain with a reassuring exam and presentation at the time.  He was instructed to take ibuprofen 800 mg TID PRN and assess response.  It was suggested that an ultrasound or sports medicine referral may be indicated if his pain does not improve.  Since his last visit, his pain is persisted with no significant change and his exam remains overall benign most consistent with arthritis is the most likely cause rather than more serious etiologies.  We will defer joint injection at this point in favor of a sports medicine referral in case other treatment options such as physical therapy may be more beneficial depending on their evaluation.  He was encouraged to continue to use NSAIDs and Tylenol to control underlying inflammation and pain.  --Sports Medicine referral --Cont Ibuprofen 800 mg TID PRN --Tylenol 1000 mg TID or QID PRN

## 2017-07-11 NOTE — Progress Notes (Signed)
CC: Knee pain  HPI:  [name] is a 52 y.o. M with a past medical history as described below who presents to the clinic for re-evaluation of knee pain.   Knee Pain: [name] was seen 12/18 for ongoing medial R knee pain without inciting factor or injury. He was able to bear weight and ambulate with no concerning features on exam. He was advised to take Ibuprofen 800 mg TID prn and follow up to re-evaluate his pain. Since that time, he reports no significant change in his pain. His pain improves mostly with rest, not significantly improved with Ibuprofen or voltaren gel. He is still able to ambulate and has not fallen due to his pain, though he notes moving up stairs has been more difficult recently.   Chest Pain: He also reports a 2-day history of chest pain described as a ache or heat.  It is substernal/central, occurs intermittently with greater than 5 episodes per day and lasts 4-5 minutes.  He notes associated nausea, metallic taste, and decreased appetite since pain onset.  The pain is not exertional, no relation of food, no associated shortness of breath or diaphoresis.  He does have a history of GERD and states that he has some burning towards his throat that feels similar but some aspects of the pain are different than his typical heartburn.  He has not had any recent alcohol use or spicy foods.  Past Medical History:  Diagnosis Date  . Alcohol abuse   . Arthritis    knees  . Asthma    uses Albuterol daily as needed  . Bipolar 1 disorder ([place])   . Bronchitis    last time 2 yrs ago  . Cellulitis   . Chronic dermatitis    due to Xarelto Rx  . Cough    smokers  . Depression   . DVT (deep venous thrombosis) ([facility])    on Coumadin Rx  . DVT (deep venous thrombosis) ([place])   . GERD (gastroesophageal reflux disease)    only takes something about 2 times a yr  . GERD (gastroesophageal reflux disease)   . Heart murmur   . Joint pain   . Pulmonary embolism ([place]) 2014  .  Shortness of breath   . Substance abuse ([place])    crack, cocaine, last use 2007  . Tobacco abuse   . Tuberculosis    ' I test Positive "   Review of Systems:  Review of Systems  Constitutional: Negative for fever.  Respiratory: Negative for cough and shortness of breath.   Cardiovascular: Positive for chest pain.  Musculoskeletal: Positive for joint pain. Negative for falls.     Physical Exam:  Vitals:   07/11/17 0935  BP: 121/88  Pulse: 77  Temp: 97.8 F (36.6 C)  TempSrc: Oral  SpO2: 97%  Weight: 257 lb 9.6 oz (116.8 kg)  Height: 6\' 4"  (1.93 m)   Physical Exam  Constitutional: He is oriented to person, place, and time. He appears well-developed and well-nourished.  HENT:  Mouth/Throat: Oropharynx is clear and moist. No oropharyngeal exudate.  Cardiovascular: Normal rate, regular rhythm and normal heart sounds.  No reproducible tenderness to chest wall   Pulmonary/Chest: Effort normal and breath sounds normal. No respiratory distress. He has no wheezes. He has no rales.  Musculoskeletal:  Full ROM of R knee, minimal crepitus, no joint laxity with medial/lateral or a/p testing, no signs of effusions. Tenderness to palpation primarily at medial joint line  Neurological: He is alert and oriented to person, place, and time.    Assessment & Plan:   See Encounters Tab for problem based charting.  Patient discussed with Dr. Dareen Piano

## 2017-07-11 NOTE — Patient Instructions (Addendum)
FOLLOW-UP INSTRUCTIONS When: ~ 2-3 months  For: Follow up of knee and chest pain  What to bring: Medications   It was nice to meet you today [name] [name].  For knee pain will put in a referral for sports medicine for further evaluation and potential treatments before resorting to a joint injection which may or may not be beneficial.  Continue to try the ibuprofen 800 mg up to 3 times a day, you can also alternate with Tylenol 1000 mg 3-4 times a day to try to decrease any inflammation associated with the pain.  For your chest pain we will increase your Protonix to twice a day instead of once a day and see if this improves the pain.  Your EKG looked good and your vital signs are reassuring that this is not due to your heart or lungs.   You can follow-up in about 2-3 months to see how your symptoms are doing.  Hopefully at that time you will have seen sports medicine.

## 2017-07-11 NOTE — Progress Notes (Signed)
Internal Medicine Clinic Attending  Case discussed with Dr. Harden at the time of the visit.  We reviewed the resident's history and exam and pertinent patient test results.  I agree with the assessment, diagnosis, and plan of care documented in the resident's note.  

## 2017-07-11 NOTE — Assessment & Plan Note (Signed)
Patient presents with complaints of a 2-day history of chest pain with atypical features and overall low concern for cardiac or pulmonary cause.  He does have a history of GERD and associated nausea, metallic taste, and decreased appetite support potential worsening of GERD symptoms as an etiology of his current presentation.  Though he denies alcohol or spicy food, the holiday.  And associated changes in diet may be an exacerbating factor.  We will increase his Protonix to twice daily until his follow-up appointment and assess symptom response.  This medication was refilled via [facility] today.  EKG performed in clinic was reassuring without ischemic changes.  --Increase Protonix to BID and reassess

## 2017-07-13 ENCOUNTER — Encounter: Payer: Self-pay | Admitting: Sports Medicine

## 2017-07-13 ENCOUNTER — Ambulatory Visit (INDEPENDENT_AMBULATORY_CARE_PROVIDER_SITE_OTHER): Payer: Self-pay | Admitting: Sports Medicine

## 2017-07-13 VITALS — BP 118/88 | Ht 76.0 in | Wt 257.0 lb

## 2017-07-13 DIAGNOSIS — M1711 Unilateral primary osteoarthritis, right knee: Secondary | ICD-10-CM

## 2017-07-13 NOTE — Progress Notes (Signed)
   [name] [name], MD Phone: [phone]  Reason For Visit: Establish Care for Knee Pain   # Patient presenting with right knee pain. Patient has had this knee pain for about 4 months. Indicates medial knee pain, which is worse with walking, standing or laying on his sided. Patient states pain is significantly worse when he squats. Patient denies any hx of trauma causing this exacerbation. Patient being involved in any sports activities. Patient denies any radiation of the knee pain. Patient has taken ibuprofen in the past which did not help significantly. Denies any hx of knee pain. Patient with a hx of blood clots - 6-7 blood clots, currently on warfarin.   Past Medical History Reviewed problem list.  Medications- reviewed and updated No additions to family history Social history- patient is a currently smoker - smokes 3 cigarettes a day   Objective: BP 118/88   Ht 6\' 4"  (1.93 m)   Wt 257 lb (116.6 kg)   BMI 31.28 kg/m  Gen: NAD, alert, cooperative with exam Knee: Right knee with varus deviation compared to left otherwise no other abnormalities noted on inspection, pain with palpation of the medial right knee well, decreased range of motion on flexion, normal range of motion on extension, negative anterior posterior draw sign, negative valgus/varus stress, positive [place], neurovascularly intact, 5 out of 5 lower extremity strength Skin: dry, intact, no rashes or lesions  Assessment/Plan: See problem based a/p  Right Knee Pain -History of osteoarthritis noted from 2017 x-ray, most notable in the medial compartment.  Patient with varus deviation which would significantly worsen his osteoarthritis.  Offered steroid injection however patient is hesitant to do this at initial visit - Isometric quadriceps exercises  - Brace for right knee - Try tylenol arthritis for pain, stop ibuprofen given patient is on Coumadin - Follow up in 4 weeks - if no  improvement will consider repeat xrays and reconsider possible steroid injection   Patient seen and evaluated with the resident. I agree with the above plan of care. Non-standing x-rays from 2017 show a moderate amount of medial compartmental narrowing. His physical exam shows an obvious varus deformity of his right knee so I suspect his arthritis is more advanced than what the x-ray suggests. We will proceed with treatment as listed above. He will follow-up with me in 4 weeks. We did discuss the possibility of a cortisone injection but he is hesitant to proceed with that at this time. I may consider updated x-rays at follow-up. Patient will call with questions or concerns prior to that visit.

## 2017-07-13 NOTE — Patient Instructions (Signed)
Please try tylenol arthritis and stop ibuprofen. Please do exercises as prescribed. Please follow up in 4 weeks.

## 2017-07-17 ENCOUNTER — Ambulatory Visit (INDEPENDENT_AMBULATORY_CARE_PROVIDER_SITE_OTHER): Payer: Self-pay | Admitting: Pharmacist

## 2017-07-17 DIAGNOSIS — Z7901 Long term (current) use of anticoagulants: Secondary | ICD-10-CM

## 2017-07-17 DIAGNOSIS — I82511 Chronic embolism and thrombosis of right femoral vein: Secondary | ICD-10-CM

## 2017-07-17 DIAGNOSIS — I82401 Acute embolism and thrombosis of unspecified deep veins of right lower extremity: Secondary | ICD-10-CM

## 2017-07-17 DIAGNOSIS — Z86718 Personal history of other venous thrombosis and embolism: Secondary | ICD-10-CM

## 2017-07-17 LAB — POCT INR: INR: 1.7

## 2017-07-17 NOTE — Progress Notes (Signed)
I reviewed Dr. Julianne Rice note.  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  INR not at goal and dose of coumadin increased.

## 2017-07-17 NOTE — Progress Notes (Signed)
Anticoagulation Management [name] [name] is a 52 y.o. male who reports to the clinic for monitoring of warfarin treatment.    Indication: DVT and PE history, recurrent Duration: indefinite Supervising physician: [facility] Visit History: Patient does not report signs/symptoms of bleeding or thromboembolism.   Anticoagulation Episode Summary    Current INR goal:   2.0-3.0  TTR:   57.4 % (2.3 y)  Next INR check:   07/24/2017  INR from last check:   1.7! (07/17/2017)  Weekly max warfarin dose:     Target end date:     INR check location:     Preferred lab:     Send INR reminders to:   ANTICOAG IMP   Indications   Right leg DVT ([place]) [I82.401] Long-term (current) use of anticoagulants [Z79.01] DVT (Resolved) [I82.409] Noncompliance with medication regimen (Resolved) [Z91.14]       Comments:         Anticoagulation Care Providers    Provider Role Specialty Phone number   Bartholomew Crews, MD  Internal Medicine [phone]      Allergies  Allergen Reactions  . Xarelto [Rivaroxaban] Dermatitis    Blisters skin  . Clindamycin/Lincomycin Dermatitis    Severe rash/dermatitis from November 2015 still present Jan 2016 from clinda   Prior to Admission medications   Medication Sig Start Date End Date Taking? Authorizing Provider  acetaminophen (TYLENOL) 325 MG tablet Take 2 tablets (650 mg total) by mouth every 6 (six) hours as needed. 02/08/17   Jola Schmidt, MD  albuterol (PROVENTIL HFA;VENTOLIN HFA) 108 (90 Base) MCG/ACT inhaler Inhale 2 puffs into the lungs every 6 (six) hours as needed for wheezing or shortness of breath. 03/16/17   Thomasene Ripple, MD  albuterol (PROVENTIL) (2.5 MG/3ML) 0.083% nebulizer solution Take 3 mLs (2.5 mg total) by nebulization every 6 (six) hours as needed for wheezing or shortness of breath. 03/16/17   Thomasene Ripple, MD  cetirizine (ZYRTEC ALLERGY) 10 MG tablet Take 1 tablet (10 mg total) by mouth daily. 04/18/17    Thomasene Ripple, MD  diclofenac sodium (VOLTAREN) 1 % GEL Apply 2 g topically 4 (four) times daily. Patient taking differently: Apply 2 g topically 4 (four) times daily as needed (pain).  01/19/16   Ophelia Shoulder, MD  DULoxetine (CYMBALTA) 60 MG capsule Take 1 capsule (60 mg total) by mouth daily. IM program 07/17/16   Dellia Nims, MD  fluticasone (FLONASE) 50 MCG/ACT nasal spray Place 1 spray into both nostrils daily. 12/18/16   Dellia Nims, MD  furosemide (LASIX) 40 MG tablet Take 1 tablet (40 mg total) by mouth daily. 03/16/17   Thomasene Ripple, MD  gabapentin (NEURONTIN) 300 MG capsule Take 3 capsules (900 mg total) by mouth 3 (three) times daily. 04/18/17   Thomasene Ripple, MD  hydrocerin (EUCERIN) CREA Apply 1 application topically daily. 03/14/16   Dellia Nims, MD  ibuprofen (ADVIL,MOTRIN) 800 MG tablet Take 1 tablet (800 mg total) by mouth every 8 (eight) hours as needed. 06/26/17   Thomasene Ripple, MD  mometasone-formoterol (DULERA) 200-5 MCG/ACT AERO Inhale 2 puffs into the lungs 2 (two) times daily. 04/10/17   Thomasene Ripple, MD  nicotine (NICOTROL) 10 MG inhaler Inhale 1 cartridge (puff for 20 minutes) as needed. Use at least 6 cartridges/day the first 3-6 weeks, reduce gradually over 12 weeks 07/14/16   Lucious Groves, DO  pantoprazole (PROTONIX) 40 MG tablet Take 1 tablet (40 mg total) by mouth 2 (two) times daily. 07/11/17 10/09/17  Tawny Asal, MD  traZODone (DESYREL) 100 MG tablet Take 1 tablet (100 mg total) by mouth at bedtime as needed for sleep. Patient not taking: Reported on 07/13/2017 12/31/16   Dellia Nims, MD  warfarin (COUMADIN) 5 MG tablet Take 3 tablets (15mg ) by mouth daily, except 2 and 1/2 tablets (12.5mg ) Sundays. IM program 06/22/17   Annia Belt, MD   Past Medical History:  Diagnosis Date  . Alcohol abuse   . Arthritis    knees  . Asthma    uses Albuterol daily as needed  . Bipolar 1 disorder ([place])   . Bronchitis    last time 2 yrs ago  .  Cellulitis   . Chronic dermatitis    due to Xarelto Rx  . Cough    smokers  . Depression   . DVT (deep venous thrombosis) ([facility])    on Coumadin Rx  . DVT (deep venous thrombosis) ([place])   . GERD (gastroesophageal reflux disease)    only takes something about 2 times a yr  . GERD (gastroesophageal reflux disease)   . Heart murmur   . Joint pain   . Pulmonary embolism ([place]) 2014  . Shortness of breath   . Substance abuse ([place])    crack, cocaine, last use 2007  . Tobacco abuse   . Tuberculosis    ' I test Positive "   Social History   Socioeconomic History  . Marital status: Single    Spouse name: Not on file  . Number of children: Not on file  . Years of education: Not on file  . Highest education level: Not on file  Social Needs  . Financial resource strain: Not on file  . Food insecurity - worry: Not on file  . Food insecurity - inability: Not on file  . Transportation needs - medical: Not on file  . Transportation needs - non-medical: Not on file  Occupational History  . Occupation: Dentist: UNEMPLOYED  Tobacco Use  . Smoking status: Current Every Day Smoker    Packs/day: 0.20    Types: Cigarettes  . Smokeless tobacco: Former Systems developer  . Tobacco comment: "stopped chewing in 1990s" 2 cigs per day. Quitting  Substance and Sexual Activity  . Alcohol use: No  . Drug use: No    Comment: last use 2016  . Sexual activity: Yes    Partners: Female  Other Topics Concern  . Not on file  Social History Narrative   Working at [employer] now unemployed.  States he has insurance through them now.    Lives with mom and step dad          Family History  Problem Relation Age of Onset  . Asthma Mother   . Throat cancer Father     ASSESSMENT Recent Results: The most recent result is correlated with 100 mg per week: Lab Results  Component Value Date   INR 1.7 07/17/2017   INR 1.2 06/22/2017   INR 1.9 06/06/2017    Anticoagulation Dosing:    INR today: Subtherapeutic  PLAN Weekly dose was Increased by 5% to 105 mg per week  Patient Instructions  Patient educated about medication as defined in this encounter and verbalized understanding by repeating back instructions provided.    Patient advised to contact clinic or seek medical attention if signs/symptoms of bleeding or thromboembolism occur.  Patient verbalized understanding by repeating back information and was advised to contact me if further medication-related questions  arise. Patient was also provided an information handout.  Follow-up Return in about 1 week (around 07/24/2017).  Hedwig Morton

## 2017-07-17 NOTE — Patient Instructions (Signed)
Patient educated about medication as defined in this encounter and verbalized understanding by repeating back instructions provided.   

## 2017-07-27 ENCOUNTER — Ambulatory Visit (INDEPENDENT_AMBULATORY_CARE_PROVIDER_SITE_OTHER): Payer: Self-pay | Admitting: Pharmacist

## 2017-07-27 DIAGNOSIS — I82401 Acute embolism and thrombosis of unspecified deep veins of right lower extremity: Secondary | ICD-10-CM

## 2017-07-27 DIAGNOSIS — Z7901 Long term (current) use of anticoagulants: Secondary | ICD-10-CM

## 2017-07-27 DIAGNOSIS — Z86718 Personal history of other venous thrombosis and embolism: Secondary | ICD-10-CM

## 2017-07-27 DIAGNOSIS — Z5181 Encounter for therapeutic drug level monitoring: Secondary | ICD-10-CM

## 2017-07-27 DIAGNOSIS — I82511 Chronic embolism and thrombosis of right femoral vein: Secondary | ICD-10-CM

## 2017-07-27 LAB — POCT INR: INR: 2.6

## 2017-07-27 NOTE — Patient Instructions (Signed)
Patient educated about medication as defined in this encounter and verbalized understanding by repeating back instructions provided.   

## 2017-07-27 NOTE — Progress Notes (Signed)
Anticoagulation Management [name] [name] is a 52 y.o. male who reports to the clinic for monitoring of warfarin treatment.    Indication: DVT and PE history, recurrent Duration: indefinite Supervising physician: Joni Reining  Anticoagulation Clinic Visit History: Patient does not report signs/symptoms of bleeding or thromboembolism.   Anticoagulation Episode Summary    Current INR goal:   2.0-3.0  TTR:   57.5 % (2.3 y)  Next INR check:   08/27/2017  INR from last check:   2.6 (07/27/2017)  Weekly max warfarin dose:     Target end date:     INR check location:     Preferred lab:     Send INR reminders to:   ANTICOAG IMP   Indications   Right leg DVT ([place]) [I82.401] Long-term (current) use of anticoagulants [Z79.01] DVT (Resolved) [I82.409]       Comments:         Anticoagulation Care Providers    Provider Role Specialty Phone number   Bartholomew Crews, MD  Internal Medicine [phone]     Allergies  Allergen Reactions  . Xarelto [Rivaroxaban] Dermatitis    Blisters skin  . Clindamycin/Lincomycin Dermatitis    Severe rash/dermatitis from November 2015 still present Jan 2016 from clinda   Medication Sig  acetaminophen (TYLENOL) 325 MG tablet Take 2 tablets (650 mg total) by mouth every 6 (six) hours as needed.  albuterol (PROVENTIL HFA;VENTOLIN HFA) 108 (90 Base) MCG/ACT inhaler Inhale 2 puffs into the lungs every 6 (six) hours as needed for wheezing or shortness of breath.  albuterol (PROVENTIL) (2.5 MG/3ML) 0.083% nebulizer solution Take 3 mLs (2.5 mg total) by nebulization every 6 (six) hours as needed for wheezing or shortness of breath.  cetirizine (ZYRTEC ALLERGY) 10 MG tablet Take 1 tablet (10 mg total) by mouth daily.  diclofenac sodium (VOLTAREN) 1 % GEL Apply 2 g topically 4 (four) times daily. Patient taking differently: Apply 2 g topically 4 (four) times daily as needed (pain).   DULoxetine (CYMBALTA) 60 MG capsule Take 1 capsule (60 mg total) by mouth  daily. IM program  fluticasone (FLONASE) 50 MCG/ACT nasal spray Place 1 spray into both nostrils daily.  furosemide (LASIX) 40 MG tablet Take 1 tablet (40 mg total) by mouth daily.  gabapentin (NEURONTIN) 300 MG capsule Take 3 capsules (900 mg total) by mouth 3 (three) times daily.  hydrocerin (EUCERIN) CREA Apply 1 application topically daily.  ibuprofen (ADVIL,MOTRIN) 800 MG tablet Take 1 tablet (800 mg total) by mouth every 8 (eight) hours as needed.  mometasone-formoterol (DULERA) 200-5 MCG/ACT AERO Inhale 2 puffs into the lungs 2 (two) times daily.  nicotine (NICOTROL) 10 MG inhaler Inhale 1 cartridge (puff for 20 minutes) as needed. Use at least 6 cartridges/day the first 3-6 weeks, reduce gradually over 12 weeks  pantoprazole (PROTONIX) 40 MG tablet Take 1 tablet (40 mg total) by mouth 2 (two) times daily.  traZODone (DESYREL) 100 MG tablet Take 1 tablet (100 mg total) by mouth at bedtime as needed for sleep. Patient not taking: Reported on 07/13/2017  warfarin (COUMADIN) 5 MG tablet Take 3 tablets (15mg ) by mouth daily, except 2 and 1/2 tablets (12.5mg ) Sundays. IM program   Past Medical History:  Diagnosis Date  . Alcohol abuse   . Arthritis    knees  . Asthma    uses Albuterol daily as needed  . Bipolar 1 disorder ([place])   . Bronchitis    last time 2 yrs ago  . Cellulitis   .  Chronic dermatitis    due to Xarelto Rx  . Cough    smokers  . Depression   . DVT (deep venous thrombosis) ([facility])    on Coumadin Rx  . DVT (deep venous thrombosis) ([place])   . GERD (gastroesophageal reflux disease)    only takes something about 2 times a yr  . GERD (gastroesophageal reflux disease)   . Heart murmur   . Joint pain   . Pulmonary embolism ([place]) 2014  . Shortness of breath   . Substance abuse ([place])    crack, cocaine, last use 2007  . Tobacco abuse   . Tuberculosis    ' I test Positive "   Social History   Socioeconomic History  . Marital status: Single    Spouse name: Not on file   . Number of children: Not on file  . Years of education: Not on file  . Highest education level: Not on file  Social Needs  . Financial resource strain: Not on file  . Food insecurity - worry: Not on file  . Food insecurity - inability: Not on file  . Transportation needs - medical: Not on file  . Transportation needs - non-medical: Not on file  Occupational History  . Occupation: Dentist: UNEMPLOYED  Tobacco Use  . Smoking status: Current Every Day Smoker    Packs/day: 0.20    Types: Cigarettes  . Smokeless tobacco: Former Systems developer  . Tobacco comment: "stopped chewing in 1990s" 2 cigs per day. Quitting  Substance and Sexual Activity  . Alcohol use: No  . Drug use: No    Comment: last use 2016  . Sexual activity: Yes    Partners: Female  Other Topics Concern  . Not on file  Social History Narrative   Working at [employer] now unemployed.  States he has insurance through them now.    Lives with mom and step dad          Family History  Problem Relation Age of Onset  . Asthma Mother   . Throat cancer Father    ASSESSMENT Recent Results: Lab Results  Component Value Date   INR 2.6 07/27/2017   INR 1.7 07/17/2017   INR 1.2 06/22/2017   Anticoagulation Dosing: 15 mg daily   INR today: Therapeutic  PLAN Weekly dose was unchanged  Patient Instructions  Patient educated about medication as defined in this encounter and verbalized understanding by repeating back instructions provided.   Patient advised to contact clinic or seek medical attention if signs/symptoms of bleeding or thromboembolism occur.  Patient verbalized understanding by repeating back information and was advised to contact me if further medication-related questions arise. Patient was also provided an information handout.  Follow-up 08/27/17  Flossie Dibble

## 2017-08-01 NOTE — Progress Notes (Signed)
INTERNAL MEDICINE TEACHING ATTENDING ADDENDUM - Lucious Groves, DO Duration- indefinate, Indication- recurrent VTE, INR-  Lab Results  Component Value Date   INR 2.6 07/27/2017  . Agree with pharmacy recommendations as outlined in their note.

## 2017-08-27 ENCOUNTER — Ambulatory Visit: Payer: Self-pay | Admitting: Sports Medicine

## 2017-08-27 ENCOUNTER — Ambulatory Visit: Payer: Self-pay | Admitting: Pharmacist

## 2017-09-03 ENCOUNTER — Ambulatory Visit: Payer: Medicare Other | Admitting: Pharmacist

## 2017-09-03 ENCOUNTER — Encounter: Payer: Self-pay | Admitting: Internal Medicine

## 2017-09-03 ENCOUNTER — Ambulatory Visit (INDEPENDENT_AMBULATORY_CARE_PROVIDER_SITE_OTHER): Payer: Medicare Other | Admitting: Internal Medicine

## 2017-09-03 ENCOUNTER — Other Ambulatory Visit: Payer: Self-pay

## 2017-09-03 VITALS — BP 124/82 | HR 89 | Temp 98.0°F | Ht 76.0 in | Wt 262.6 lb

## 2017-09-03 DIAGNOSIS — M25561 Pain in right knee: Secondary | ICD-10-CM | POA: Diagnosis not present

## 2017-09-03 DIAGNOSIS — Z7901 Long term (current) use of anticoagulants: Secondary | ICD-10-CM

## 2017-09-03 DIAGNOSIS — Z79899 Other long term (current) drug therapy: Secondary | ICD-10-CM

## 2017-09-03 DIAGNOSIS — J449 Chronic obstructive pulmonary disease, unspecified: Secondary | ICD-10-CM

## 2017-09-03 DIAGNOSIS — Z86718 Personal history of other venous thrombosis and embolism: Secondary | ICD-10-CM | POA: Diagnosis not present

## 2017-09-03 DIAGNOSIS — Z23 Encounter for immunization: Secondary | ICD-10-CM | POA: Diagnosis not present

## 2017-09-03 DIAGNOSIS — L309 Dermatitis, unspecified: Secondary | ICD-10-CM

## 2017-09-03 DIAGNOSIS — K219 Gastro-esophageal reflux disease without esophagitis: Secondary | ICD-10-CM | POA: Diagnosis not present

## 2017-09-03 DIAGNOSIS — F17210 Nicotine dependence, cigarettes, uncomplicated: Secondary | ICD-10-CM

## 2017-09-03 DIAGNOSIS — G4700 Insomnia, unspecified: Secondary | ICD-10-CM | POA: Diagnosis not present

## 2017-09-03 DIAGNOSIS — Z5181 Encounter for therapeutic drug level monitoring: Secondary | ICD-10-CM

## 2017-09-03 DIAGNOSIS — I82501 Chronic embolism and thrombosis of unspecified deep veins of right lower extremity: Secondary | ICD-10-CM | POA: Diagnosis not present

## 2017-09-03 DIAGNOSIS — I82401 Acute embolism and thrombosis of unspecified deep veins of right lower extremity: Secondary | ICD-10-CM

## 2017-09-03 DIAGNOSIS — G8929 Other chronic pain: Secondary | ICD-10-CM | POA: Diagnosis not present

## 2017-09-03 DIAGNOSIS — I82511 Chronic embolism and thrombosis of right femoral vein: Secondary | ICD-10-CM

## 2017-09-03 DIAGNOSIS — M5416 Radiculopathy, lumbar region: Secondary | ICD-10-CM | POA: Diagnosis not present

## 2017-09-03 DIAGNOSIS — I82411 Acute embolism and thrombosis of right femoral vein: Secondary | ICD-10-CM

## 2017-09-03 DIAGNOSIS — Z7951 Long term (current) use of inhaled steroids: Secondary | ICD-10-CM

## 2017-09-03 DIAGNOSIS — Z0000 Encounter for general adult medical examination without abnormal findings: Secondary | ICD-10-CM

## 2017-09-03 LAB — POCT INR: INR: 2.9

## 2017-09-03 MED ORDER — TRAZODONE HCL 100 MG PO TABS: 100.0000 mg | ORAL_TABLET | Freq: Every evening | ORAL | 3 refills | Status: DC | PRN

## 2017-09-03 MED ORDER — DULOXETINE HCL 60 MG PO CPEP: 60.0000 mg | ORAL_CAPSULE | Freq: Every day | ORAL | 3 refills | Status: DC

## 2017-09-03 MED ORDER — FUROSEMIDE 40 MG PO TABS: 40.0000 mg | ORAL_TABLET | Freq: Every day | ORAL | 3 refills | Status: AC

## 2017-09-03 MED ORDER — HYDROCORTISONE 1 % EX CREA: 1.0000 "application " | TOPICAL_CREAM | Freq: Two times a day (BID) | CUTANEOUS | 0 refills | Status: AC

## 2017-09-03 MED ORDER — TIOTROPIUM BROMIDE MONOHYDRATE 18 MCG IN CAPS: 18.0000 ug | ORAL_CAPSULE | Freq: Once | RESPIRATORY_TRACT | 3 refills | Status: DC

## 2017-09-03 MED ORDER — GABAPENTIN 300 MG PO CAPS: 900.0000 mg | ORAL_CAPSULE | Freq: Three times a day (TID) | ORAL | 3 refills | Status: DC

## 2017-09-03 MED ORDER — HYDROCERIN EX CREA: 1.0000 "application " | TOPICAL_CREAM | Freq: Every day | CUTANEOUS | 3 refills | Status: DC

## 2017-09-03 MED ORDER — PANTOPRAZOLE SODIUM 40 MG PO TBEC: 40.0000 mg | DELAYED_RELEASE_TABLET | Freq: Two times a day (BID) | ORAL | 3 refills | Status: DC

## 2017-09-03 NOTE — Assessment & Plan Note (Signed)
Multiple, well healed raised plaques with overlying dry skin. Patient report increased itching of skin around plaques. No signs or symptoms of current infection. Patient not using cream 2/2 running out of previous prescription.  Plan: -Hydrocortisone 1% BID -Eucerin cream daily -RTC in 3 months for evaluation

## 2017-09-03 NOTE — Progress Notes (Signed)
Anticoagulation Management [name] [name] is a 52 y.o. male who reports to the clinic for monitoring of warfarin treatment.    Indication: DVT, history of; long term current use fo anticoagulants.  Duration: indefinite Supervising physician: Gilles Chiquito  Anticoagulation Clinic Visit History: Patient does not report signs/symptoms of bleeding or thromboembolism  Other recent changes: No diet, medications, lifestyle changes endorsed by the patient to me at this visit.  Anticoagulation Episode Summary    Current INR goal:   2.0-3.0  TTR:   59.3 % (2.4 y)  Next INR check:   10/01/2017  INR from last check:   2.90 (09/03/2017)  Weekly max warfarin dose:     Target end date:     INR check location:     Preferred lab:     Send INR reminders to:   ANTICOAG IMP   Indications   Right leg DVT ([place]) [I82.401] Long-term (current) use of anticoagulants [Z79.01] DVT (Resolved) [I82.409]       Comments:         Anticoagulation Care Providers    Provider Role Specialty Phone number   Bartholomew Crews, MD  Internal Medicine [phone]      Allergies  Allergen Reactions  . Xarelto [Rivaroxaban] Dermatitis    Blisters skin  . Clindamycin/Lincomycin Dermatitis    Severe rash/dermatitis from November 2015 still present Jan 2016 from clinda   Prior to Admission medications   Medication Sig Start Date End Date Taking? Authorizing Provider  acetaminophen (TYLENOL) 325 MG tablet Take 2 tablets (650 mg total) by mouth every 6 (six) hours as needed. 02/08/17  Yes Jola Schmidt, MD  albuterol (PROVENTIL HFA;VENTOLIN HFA) 108 (90 Base) MCG/ACT inhaler Inhale 2 puffs into the lungs every 6 (six) hours as needed for wheezing or shortness of breath. 03/16/17  Yes Nedrud, Larena Glassman, MD  albuterol (PROVENTIL) (2.5 MG/3ML) 0.083% nebulizer solution Take 3 mLs (2.5 mg total) by nebulization every 6 (six) hours as needed for wheezing or shortness of breath. 03/16/17  Yes Thomasene Ripple, MD  cetirizine  (ZYRTEC ALLERGY) 10 MG tablet Take 1 tablet (10 mg total) by mouth daily. 04/18/17  Yes Nedrud, Larena Glassman, MD  diclofenac sodium (VOLTAREN) 1 % GEL Apply 2 g topically 4 (four) times daily. Patient taking differently: Apply 2 g topically 4 (four) times daily as needed (pain).  01/19/16  Yes Ophelia Shoulder, MD  DULoxetine (CYMBALTA) 60 MG capsule Take 1 capsule (60 mg total) by mouth daily. IM program 09/03/17  Yes Thomasene Ripple, MD  furosemide (LASIX) 40 MG tablet Take 1 tablet (40 mg total) by mouth daily. 09/03/17  Yes Nedrud, Larena Glassman, MD  gabapentin (NEURONTIN) 300 MG capsule Take 3 capsules (900 mg total) by mouth 3 (three) times daily. 09/03/17  Yes Nedrud, Larena Glassman, MD  hydrocerin (EUCERIN) CREA Apply 1 application topically daily. 09/03/17  Yes Nedrud, Larena Glassman, MD  hydrocortisone cream 1 % Apply 1 application topically 2 (two) times daily. 09/03/17  Yes Nedrud, Larena Glassman, MD  ibuprofen (ADVIL,MOTRIN) 800 MG tablet Take 1 tablet (800 mg total) by mouth every 8 (eight) hours as needed. 06/26/17  Yes Nedrud, Larena Glassman, MD  mometasone-formoterol (DULERA) 200-5 MCG/ACT AERO Inhale 2 puffs into the lungs 2 (two) times daily. 04/10/17  Yes Nedrud, Larena Glassman, MD  nicotine (NICOTROL) 10 MG inhaler Inhale 1 cartridge (puff for 20 minutes) as needed. Use at least 6 cartridges/day the first 3-6 weeks, reduce gradually over 12 weeks 07/14/16  Yes Lucious Groves, DO  pantoprazole (PROTONIX) 40 MG tablet  Take 1 tablet (40 mg total) by mouth 2 (two) times daily. 09/03/17  Yes Nedrud, Larena Glassman, MD  tiotropium (SPIRIVA HANDIHALER) 18 MCG inhalation capsule Place 1 capsule (18 mcg total) into inhaler and inhale once for 1 dose. 09/03/17 09/03/17 Yes NedrudLarena Glassman, MD  traZODone (DESYREL) 100 MG tablet Take 1 tablet (100 mg total) by mouth at bedtime as needed for sleep. 09/03/17  Yes Thomasene Ripple, MD  warfarin (COUMADIN) 5 MG tablet Take 3 tablets (15mg ) by mouth daily, except 2 and 1/2 tablets (12.5mg ) Sundays. IM  program 06/22/17  Yes Annia Belt, MD   Past Medical History:  Diagnosis Date  . Alcohol abuse   . Arthritis    knees  . Asthma    uses Albuterol daily as needed  . Bipolar 1 disorder ([place])   . Bronchitis    last time 2 yrs ago  . Cellulitis   . Chronic dermatitis    due to Xarelto Rx  . Cough    smokers  . Depression   . DVT (deep venous thrombosis) ([facility])    on Coumadin Rx  . DVT (deep venous thrombosis) ([place])   . GERD (gastroesophageal reflux disease)    only takes something about 2 times a yr  . GERD (gastroesophageal reflux disease)   . Heart murmur   . Joint pain   . Pulmonary embolism ([place]) 2014  . Shortness of breath   . Substance abuse ([place])    crack, cocaine, last use 2007  . Tobacco abuse   . Tuberculosis    ' I test Positive "   Social History   Socioeconomic History  . Marital status: Single    Spouse name: Not on file  . Number of children: Not on file  . Years of education: Not on file  . Highest education level: Not on file  Social Needs  . Financial resource strain: Not on file  . Food insecurity - worry: Not on file  . Food insecurity - inability: Not on file  . Transportation needs - medical: Not on file  . Transportation needs - non-medical: Not on file  Occupational History  . Occupation: Dentist: UNEMPLOYED  Tobacco Use  . Smoking status: Current Every Day Smoker    Packs/day: 0.20    Types: Cigarettes  . Smokeless tobacco: Former Systems developer  . Tobacco comment: "stopped chewing in 1990s" 2 cigs per day. Quitting  Substance and Sexual Activity  . Alcohol use: No  . Drug use: No    Comment: last use 2016  . Sexual activity: Yes    Partners: Female  Other Topics Concern  . Not on file  Social History Narrative   Working at [employer] now unemployed.  States he has insurance through them now.    Lives with mom and step dad          Family History  Problem Relation Age of Onset  . Asthma Mother    . Throat cancer Father     ASSESSMENT Recent Results: The most recent result is correlated with 105 mg per week: Lab Results  Component Value Date   INR 2.90 09/03/2017   INR 2.6 07/27/2017   INR 1.7 07/17/2017    Anticoagulation Dosing: Description   Take 3 of your 5mg  peach-colored warfarin tablets by mouth, once-daily, at [facility] each day.      INR today: Therapeutic  PLAN Weekly dose was unchanged. Will remain on 105mg /wk (3x5mg  strength warfarin  tablets by mouth, once-daily at [facility] each day).   Patient Instructions  Patient instructed to take medications as defined in the Anti-coagulation Track section of this encounter.  Patient instructed to take today's dose.  Patient instructed to take 3 of your 5mg  peach-colored warfarin tablets by mouth, once-daily, at [facility] each day. Patient verbalized understanding of these instructions.     Patient advised to contact clinic or seek medical attention if signs/symptoms of bleeding or thromboembolism occur.  Patient verbalized understanding by repeating back information and was advised to contact me if further medication-related questions arise. Patient was also provided an information handout.  Follow-up Return in 4 weeks (on 10/01/2017) for Follow up INR at 3:45PM.  Pennie Banter, PharmD, CACP, CPP  15 minutes spent face-to-face with the patient during the encounter. 50% of time spent on education. 50% of time was spent on point of care fingerstick INR sample collection, processing, results determination, documentation in [email or web address].

## 2017-09-03 NOTE — Assessment & Plan Note (Addendum)
Patient with mild wheezing and decreased air movement on exam. Upon further review patient states he has dry cough which awakens him from sleep multiple nights per week. Patient states he has difficulty completing some ADL's, including climbing stairs, 2/2 cough and difficulty breathing. Reports use of rescue inhaler multiple times during one week span. Altogether this suggests inadequate control of baseline symptoms on current regimen. Patient will need to start additional inhaler, tiotropium, to help with symptoms and to obtain improved control.  Plan: -Continue Dulera 2 puffs BID -START Spiriva one puff daily -DME order for nebulizer to use PRN albuterol -RTC in 3 months for reevaluation

## 2017-09-03 NOTE — Assessment & Plan Note (Signed)
Patient states sharp, stabbing pain radiating to bilateral lower extremities which is well controlled with 900 mg gabapentin TID.   Plan: -Continue gabapentin 900 mg TID, refills provided

## 2017-09-03 NOTE — Patient Instructions (Signed)
Thank you for seeing [name] in the clinic today!  You were evaluated for your chronic medical conditions. Please continue taking all medications as previously prescribed. Please START taking the inhaler Spiriva daily to help with your COPD. Please let the clinic know if you have trouble with this medication.  Please return to the clinic in 3-6 months for follow up of your chronic medical conditions.  If you have any questions or concerns, please call our clinic at [phone] between the hours of 9am-5pm. If you have a problem after these hours, please call [phone] and ask for the internal medicine resident on call. If you feel you are having a medical emergency please call 911.   Thanks, Dr. Larena Glassman Leasia Swann

## 2017-09-03 NOTE — Assessment & Plan Note (Signed)
Patient reports daily GERD symptoms, including burning in chest, which occur after running out of pantoprazole 40 mg BID. Refill provided.  Plan: -Pantoprazole 40 mg BID

## 2017-09-03 NOTE — Assessment & Plan Note (Signed)
Patient with INR check and visit with clinical pharmacist at end of encounter.  Plan: -POC INR -Warfarin 15 mg daily, except on Sunday 12.5 mg daily with adjustments per clinical pharmacist at end of visit

## 2017-09-03 NOTE — Assessment & Plan Note (Signed)
Flu shot today 

## 2017-09-03 NOTE — Patient Instructions (Signed)
Patient instructed to take medications as defined in the Anti-coagulation Track section of this encounter.  Patient instructed to take today's dose.  Patient instructed to take 3 of your 5mg  peach-colored warfarin tablets by mouth, once-daily, at [facility] each day. Patient verbalized understanding of these instructions.

## 2017-09-03 NOTE — Progress Notes (Signed)
   CC: COPD follow up  HPI:  [name] is a 52 y.o. of recurrent VTE, insomnia, COPD, and GERD who presents for management of chronic medical conditions. Patient states that he has been in relatively good state of health since last seen by physician in 07/2017 for R knee pain. Patient has been able to take all medications as previously prescribed. Patient states that regimen of Tylenol, Ibuprofen, and Voltaren Gel have helped relieve his symptoms of R knee pain. Patient seen sports medicine and got brace for knee in January. This has been helping his symptoms and he really only feels pain at night.   Past Medical History: Past Medical History:  Diagnosis Date  . Alcohol abuse   . Arthritis    knees  . Asthma    uses Albuterol daily as needed  . Bipolar 1 disorder ([place])   . Bronchitis    last time 2 yrs ago  . Cellulitis   . Chronic dermatitis    due to Xarelto Rx  . Cough    smokers  . Depression   . DVT (deep venous thrombosis) ([facility])    on Coumadin Rx  . DVT (deep venous thrombosis) ([place])   . GERD (gastroesophageal reflux disease)    only takes something about 2 times a yr  . GERD (gastroesophageal reflux disease)   . Heart murmur   . Joint pain   . Pulmonary embolism ([place]) 2014  . Shortness of breath   . Substance abuse ([place])    crack, cocaine, last use 2007  . Tobacco abuse   . Tuberculosis    ' I test Positive "   Review of Systems:   Patient endorses R knee pain, as per HPI Patient denies chest pain, shortness of breath, abdominal pain, diaphoresis, nausea/vomiting, lower extremity swelling, and change in bowel/bladder habits.  Physical Exam:  Vitals:   09/03/17 1544  BP: 124/82  Pulse: 89  Temp: 98 F (36.7 C)  TempSrc: Oral  SpO2: 97%  Weight: 262 lb 9.6 oz (119.1 kg)  Height: 6\' 4"  (1.93 m)   Physical Exam  Constitutional: He appears well-developed and well-nourished. No distress.  HENT:  Mouth/Throat: Oropharynx is clear and moist.    Cardiovascular: Normal rate, regular rhythm and intact distal pulses. Exam reveals no friction rub.  No murmur heard. Respiratory: Effort normal.  No nasal flaring or accessory muscle use. Speaking in full sentences. Mild end-expiratory wheezing and decreased air movement throughout all lung fields.  GI: Soft. He exhibits no distension. There is no tenderness.  Musculoskeletal: He exhibits edema (Trace on RLE) and tenderness (with palpation of RLE).  R knee in brace, limits formal ROM testing.   Skin: Skin is warm and dry. No rash noted. No erythema.  Multiple, dry scaly raised plaques, largest 1x2cm, on inferomedial aspects of bilateral lower extremities.  Psychiatric: He has a normal mood and affect. His behavior is normal. Thought content normal.   Assessment & Plan:   See Encounters Tab for problem based charting.  Patient discussed with Dr. Dareen Piano

## 2017-09-03 NOTE — Assessment & Plan Note (Signed)
Poor sleep hygiene at baseline - counseled about habits to promote sleep, but patient insistent about need for Trazadone to stay asleep. Will use PRN. Counseled about side effects.  Plan: -Trazadone 100 mg qNightly PRN

## 2017-09-04 ENCOUNTER — Encounter: Payer: Self-pay | Admitting: Sports Medicine

## 2017-09-04 ENCOUNTER — Ambulatory Visit (INDEPENDENT_AMBULATORY_CARE_PROVIDER_SITE_OTHER): Payer: Medicare Other | Admitting: Sports Medicine

## 2017-09-04 VITALS — BP 110/80 | Ht 76.0 in | Wt 257.0 lb

## 2017-09-04 DIAGNOSIS — M1711 Unilateral primary osteoarthritis, right knee: Secondary | ICD-10-CM | POA: Diagnosis not present

## 2017-09-04 NOTE — Progress Notes (Signed)
   Subjective:    Patient ID: [name] [name], male    DOB: Jul 11, 1965, 52 y.o.   MRN: [id number]  HPI   Patient comes in today for follow-up on right knee osteoarthritis. Overall, he is doing okay. He thinks the knee brace has been helpful. He has been doing his isometric exercises. He uses both Tylenol and ibuprofen for pain control. His PCP knows about his ibuprofen despite his current treatment for DVT. He denies swelling in the knee. No locking.   Review of Systems    as above Objective:   Physical Exam  Well-developed, well-nourished. No acute distress. Awake alert and oriented 3. Vital signs reviewed  Right knee: Full range of motion. No effusion. He is tender to palpation along the medial joint line with pain but no popping with McMurray's. No tenderness laterally. Good ligament stability. Neurovascularly intact distally.      Assessment & Plan:   Right knee pain secondary to DJD  Patient's symptoms are currently tolerable. He'll continue with his knee brace when active. I also encouraged him to apply ice on occasion when his knee gets sore. I've also encouraged him to continue with his isometric quad exercises. If symptoms worsen, patient will return to the office for consideration of a cortisone injection. Follow-up as needed.

## 2017-09-04 NOTE — Progress Notes (Signed)
I reviewed Dr. Gladstone Pih note.  INR at goal and dose unchanged.

## 2017-09-05 NOTE — Progress Notes (Signed)
Internal Medicine Clinic Attending  Case discussed with Dr. Nedrud at the time of the visit.  We reviewed the resident's history and exam and pertinent patient test results.  I agree with the assessment, diagnosis, and plan of care documented in the resident's note.  

## 2017-09-06 ENCOUNTER — Encounter (HOSPITAL_COMMUNITY): Payer: Self-pay | Admitting: Emergency Medicine

## 2017-09-06 ENCOUNTER — Emergency Department (HOSPITAL_COMMUNITY)
Admission: EM | Admit: 2017-09-06 | Discharge: 2017-09-07 | Disposition: A | Discharge status: Home / Self Care | Payer: Medicare Other | Attending: Emergency Medicine | Admitting: Emergency Medicine

## 2017-09-06 DIAGNOSIS — F17210 Nicotine dependence, cigarettes, uncomplicated: Secondary | ICD-10-CM | POA: Insufficient documentation

## 2017-09-06 DIAGNOSIS — I5033 Acute on chronic diastolic (congestive) heart failure: Secondary | ICD-10-CM | POA: Diagnosis not present

## 2017-09-06 DIAGNOSIS — Z79899 Other long term (current) drug therapy: Secondary | ICD-10-CM | POA: Diagnosis not present

## 2017-09-06 DIAGNOSIS — J449 Chronic obstructive pulmonary disease, unspecified: Secondary | ICD-10-CM | POA: Insufficient documentation

## 2017-09-06 DIAGNOSIS — M791 Myalgia, unspecified site: Secondary | ICD-10-CM

## 2017-09-06 DIAGNOSIS — J45909 Unspecified asthma, uncomplicated: Secondary | ICD-10-CM | POA: Insufficient documentation

## 2017-09-06 DIAGNOSIS — R109 Unspecified abdominal pain: Secondary | ICD-10-CM | POA: Diagnosis not present

## 2017-09-06 DIAGNOSIS — N200 Calculus of kidney: Secondary | ICD-10-CM

## 2017-09-06 DIAGNOSIS — M7918 Myalgia, other site: Secondary | ICD-10-CM | POA: Diagnosis not present

## 2017-09-06 DIAGNOSIS — F319 Bipolar disorder, unspecified: Secondary | ICD-10-CM | POA: Insufficient documentation

## 2017-09-06 DIAGNOSIS — M79605 Pain in left leg: Secondary | ICD-10-CM | POA: Diagnosis present

## 2017-09-06 DIAGNOSIS — M79604 Pain in right leg: Secondary | ICD-10-CM | POA: Diagnosis not present

## 2017-09-06 DIAGNOSIS — Z7901 Long term (current) use of anticoagulants: Secondary | ICD-10-CM | POA: Diagnosis not present

## 2017-09-06 DIAGNOSIS — F329 Major depressive disorder, single episode, unspecified: Secondary | ICD-10-CM | POA: Insufficient documentation

## 2017-09-06 LAB — URINALYSIS, ROUTINE W REFLEX MICROSCOPIC
Bacteria, UA: NONE SEEN
Bilirubin Urine: NEGATIVE
Glucose, UA: NEGATIVE mg/dL
Ketones, ur: NEGATIVE mg/dL
Leukocytes, UA: NEGATIVE
Nitrite: NEGATIVE
PROTEIN: NEGATIVE mg/dL
SPECIFIC GRAVITY, URINE: 1.019 (ref 1.005–1.030)
Squamous Epithelial / LPF: NONE SEEN
pH: 6 (ref 5.0–8.0)

## 2017-09-06 LAB — COMPREHENSIVE METABOLIC PANEL
ALT: 23 U/L (ref 17–63)
AST: 25 U/L (ref 15–41)
Albumin: 4.1 g/dL (ref 3.5–5.0)
Alkaline Phosphatase: 94 U/L (ref 38–126)
Anion gap: 12 (ref 5–15)
BILIRUBIN TOTAL: 0.7 mg/dL (ref 0.3–1.2)
BUN: 12 mg/dL (ref 6–20)
CO2: 21 mmol/L — AB (ref 22–32)
CREATININE: 1.11 mg/dL (ref 0.61–1.24)
Calcium: 9.3 mg/dL (ref 8.9–10.3)
Chloride: 104 mmol/L (ref 101–111)
Glucose, Bld: 95 mg/dL (ref 65–99)
Potassium: 3.8 mmol/L (ref 3.5–5.1)
SODIUM: 137 mmol/L (ref 135–145)
Total Protein: 6.9 g/dL (ref 6.5–8.1)

## 2017-09-06 LAB — CBC
HCT: 46 % (ref 39.0–52.0)
Hemoglobin: 15.8 g/dL (ref 13.0–17.0)
MCH: 32.2 pg (ref 26.0–34.0)
MCHC: 34.3 g/dL (ref 30.0–36.0)
MCV: 93.7 fL (ref 78.0–100.0)
PLATELETS: 201 10*3/uL (ref 150–400)
RBC: 4.91 MIL/uL (ref 4.22–5.81)
RDW: 13.6 % (ref 11.5–15.5)
WBC: 7.7 10*3/uL (ref 4.0–10.5)

## 2017-09-06 LAB — LIPASE, BLOOD: Lipase: 49 U/L (ref 11–51)

## 2017-09-06 NOTE — ED Triage Notes (Signed)
Pt c/o bilateral leg pain, left sided abdominal pain, and left arm pain. Hx DVT, currently taking coumadin. Denies nausea/vomiting/diarrhea.

## 2017-09-07 ENCOUNTER — Other Ambulatory Visit: Payer: Self-pay

## 2017-09-07 ENCOUNTER — Emergency Department (HOSPITAL_COMMUNITY): Payer: Medicare Other

## 2017-09-07 DIAGNOSIS — N200 Calculus of kidney: Secondary | ICD-10-CM | POA: Diagnosis not present

## 2017-09-07 DIAGNOSIS — R109 Unspecified abdominal pain: Secondary | ICD-10-CM | POA: Diagnosis not present

## 2017-09-07 DIAGNOSIS — M79604 Pain in right leg: Secondary | ICD-10-CM | POA: Diagnosis not present

## 2017-09-07 DIAGNOSIS — M791 Myalgia, unspecified site: Secondary | ICD-10-CM | POA: Diagnosis not present

## 2017-09-07 LAB — PROTIME-INR
INR: 2.01
Prothrombin Time: 22.6 seconds — ABNORMAL HIGH (ref 11.4–15.2)

## 2017-09-07 MED ORDER — OXYCODONE-ACETAMINOPHEN 5-325 MG PO TABS
1.0000 | ORAL_TABLET | Freq: Once | ORAL | Status: AC
  Administered 2017-09-07: 1 via ORAL
  Filled 2017-09-07: qty 1

## 2017-09-07 MED ORDER — OXYCODONE-ACETAMINOPHEN 7.5-325 MG PO TABS: 1.0000 | ORAL_TABLET | ORAL | 0 refills | Status: DC | PRN

## 2017-09-07 NOTE — ED Notes (Signed)
Delay in lab draw,  edp at bedside. 

## 2017-09-07 NOTE — Discharge Instructions (Signed)
Return if symptoms are getting worse. °
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
## 2017-10-01 ENCOUNTER — Ambulatory Visit (INDEPENDENT_AMBULATORY_CARE_PROVIDER_SITE_OTHER): Payer: Medicare Other | Admitting: Pharmacist

## 2017-10-01 DIAGNOSIS — I82511 Chronic embolism and thrombosis of right femoral vein: Secondary | ICD-10-CM

## 2017-10-01 DIAGNOSIS — Z5181 Encounter for therapeutic drug level monitoring: Secondary | ICD-10-CM | POA: Diagnosis not present

## 2017-10-01 DIAGNOSIS — Z86718 Personal history of other venous thrombosis and embolism: Secondary | ICD-10-CM | POA: Diagnosis not present

## 2017-10-01 DIAGNOSIS — I2699 Other pulmonary embolism without acute cor pulmonale: Secondary | ICD-10-CM

## 2017-10-01 DIAGNOSIS — I82401 Acute embolism and thrombosis of unspecified deep veins of right lower extremity: Secondary | ICD-10-CM

## 2017-10-01 DIAGNOSIS — Z7901 Long term (current) use of anticoagulants: Secondary | ICD-10-CM | POA: Diagnosis not present

## 2017-10-01 LAB — POCT INR: INR: 1.5

## 2017-10-01 MED ORDER — WARFARIN SODIUM 5 MG PO TABS: ORAL_TABLET | ORAL | 2 refills | Status: DC

## 2017-10-01 MED FILL — WARFARIN SODIUM 5 MG TABLET: 5 | 28 days supply | Qty: 84 | Fill #0

## 2017-10-01 NOTE — Patient Instructions (Signed)
Patient instructed to take medications as defined in the Anti-coagulation Track section of this encounter.  Patient instructed to take today's dose.  Patient instructed to take 3 of your 5mg  peach-colored warfarin tablets by mouth, once-daily, at [facility] each day. Patient verbalized understanding of these instructions.

## 2017-10-01 NOTE — Progress Notes (Signed)
Anticoagulation Management [name] [name] is a 52 y.o. male who reports to the clinic for monitoring of warfarin treatment.    Indication: DVT  Duration: indefinite Supervising physician: Aldine Contes  Anticoagulation Clinic Visit History: Patient does not report signs/symptoms of bleeding or thromboembolism  Other recent changes: No diet, medications, lifestyle changes except as noted in patient findings (missed 10mg  of 15mg  dose 2 days ago).  Anticoagulation Episode Summary    Current INR goal:   2.0-3.0  TTR:   57.9 % (2.5 y)  Next INR check:   10/29/2017  INR from last check:   1.50! 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 Xarelto [Rivaroxaban] Dermatitis    Blisters skin  . Clindamycin/Lincomycin Dermatitis    Severe rash/dermatitis from November 2015 still present Jan 2016 from clinda   Prior to Admission medications   Medication Sig Start Date End Date Taking? Authorizing Provider  acetaminophen (TYLENOL) 325 MG tablet Take 2 tablets (650 mg total) by mouth every 6 (six) hours as needed. 02/08/17  Yes Jola Schmidt, MD  albuterol (PROVENTIL HFA;VENTOLIN HFA) 108 (90 Base) MCG/ACT inhaler Inhale 2 puffs into the lungs every 6 (six) hours as needed for wheezing or shortness of breath. 03/16/17  Yes Nedrud, Larena Glassman, MD  albuterol (PROVENTIL) (2.5 MG/3ML) 0.083% nebulizer solution Take 3 mLs (2.5 mg total) by nebulization every 6 (six) hours as needed for wheezing or shortness of breath. 03/16/17  Yes Thomasene Ripple, MD  cetirizine (ZYRTEC ALLERGY) 10 MG  tablet Take 1 tablet (10 mg total) by mouth daily. 04/18/17  Yes Nedrud, Larena Glassman, MD  diclofenac sodium (VOLTAREN) 1 % GEL Apply 2 g topically 4 (four) times daily. Patient taking differently: Apply 2 g topically 4 (four) times daily as needed (pain).  01/19/16  Yes Ophelia Shoulder, MD  DULoxetine (CYMBALTA) 60 MG capsule Take 1 capsule (60 mg total) by mouth daily. IM program 09/03/17  Yes Thomasene Ripple, MD  furosemide (LASIX) 40 MG tablet Take 1 tablet (40 mg total) by mouth daily. 09/03/17  Yes Nedrud, Larena Glassman, MD  gabapentin (NEURONTIN) 300 MG capsule Take 3 capsules (900 mg total) by mouth 3 (three) times daily. 09/03/17  Yes Nedrud, Larena Glassman, MD  hydrocerin (EUCERIN) CREA Apply 1 application topically daily. 09/03/17  Yes Nedrud, Larena Glassman, MD  hydrocortisone cream 1 % Apply 1 application topically 2 (two) times daily. 09/03/17  Yes Nedrud, Larena Glassman, MD  ibuprofen (ADVIL,MOTRIN) 800 MG tablet Take 1 tablet (800 mg total) by mouth every 8 (eight) hours as needed. 06/26/17  Yes Nedrud, Larena Glassman, MD  mometasone-formoterol (DULERA) 200-5 MCG/ACT AERO Inhale 2 puffs into the lungs 2 (two) times daily. 04/10/17  Yes Nedrud, Larena Glassman, MD  nicotine (NICOTROL) 10 MG inhaler Inhale 1 cartridge (puff for 20 minutes) as needed. Use at least 6 cartridges/day the first 3-6 weeks, reduce gradually over 12 weeks 07/14/16  Yes Lucious Groves, DO  oxyCODONE-acetaminophen (PERCOCET) 7.5-325 MG tablet Take 1 tablet  by mouth every 4 (four) hours as needed for severe pain. 2/0/25  Yes Delora Fuel, MD  pantoprazole (PROTONIX) 40 MG tablet Take 1 tablet (40 mg total) by mouth 2 (two) times daily. 09/03/17  Yes Thomasene Ripple, MD  traZODone (DESYREL) 100 MG tablet Take 1 tablet (100 mg total) by mouth at bedtime as needed for sleep. 09/03/17  Yes Thomasene Ripple, MD  warfarin (COUMADIN) 5 MG tablet Take 3 tablets by mouth, once-daily at 6PM. IM Program. 10/01/17  Yes Pennie Banter, RPH-CPP  tiotropium (SPIRIVA HANDIHALER)  18 MCG inhalation capsule Place 1 capsule (18 mcg total) into inhaler and inhale once for 1 dose. 09/03/17 09/03/17  Thomasene Ripple, MD   Past Medical History:  Diagnosis Date  . Alcohol abuse   . Arthritis    knees  . Asthma    uses Albuterol daily as needed  . Bipolar 1 disorder ([place])   . Bronchitis    last time 2 yrs ago  . Cellulitis   . Chronic dermatitis    due to Xarelto Rx  . Cough    smokers  . Depression   . DVT (deep venous thrombosis) ([facility])    on Coumadin Rx  . DVT (deep venous thrombosis) ([place])   . GERD (gastroesophageal reflux disease)    only takes something about 2 times a yr  . GERD (gastroesophageal reflux disease)   . Heart murmur   . Joint pain   . Pulmonary embolism ([place]) 2014  . Shortness of breath   . Substance abuse ([place])    crack, cocaine, last use 2007  . Tobacco abuse   . Tuberculosis    ' I test Positive "   Social History   Socioeconomic History  . Marital status: Single    Spouse name: Not on file  . Number of children: Not on file  . Years of education: Not on file  . Highest education level: Not on file  Occupational History  . Occupation: Dentist: UNEMPLOYED  Social Needs  . Financial resource strain: Not on file  . Food insecurity:    Worry: Not on file    Inability: Not on file  . Transportation needs:    Medical: Not on file    Non-medical: Not on file  Tobacco Use  . Smoking status: Current Every Day Smoker    Packs/day: 0.20    Types: Cigarettes  . Smokeless tobacco: Former Systems developer  . Tobacco comment: "stopped chewing in 1990s" 2 cigs per day. Quitting  Substance and Sexual Activity  . Alcohol use: No  . Drug use: No    Types: Cocaine    Comment: last use 2016  . Sexual activity: Yes    Partners: Female  Lifestyle  . Physical activity:    Days per week: Not on file    Minutes per session: Not on file  . Stress: Not on file  Relationships  . Social connections:    Talks on phone: Not on file     Gets together: Not on file    Attends religious service: Not on file    Active member of club or organization: Not on file    Attends meetings of clubs or organizations: Not on file    Relationship status: Not on file  Other Topics Concern  . Not on file  Social History Narrative   Working at [employer] now unemployed.  States he has insurance through them now.  Lives with mom and step dad          Family History  Problem Relation Age of Onset  . Asthma Mother   . Throat cancer Father     ASSESSMENT Recent Results: The most recent result is correlated with 105 mg per week--but patient MISSED 10mg  of his scheduled 15mg  dose two (2) days ago: Lab Results  Component Value Date   INR 1.50 10/01/2017   INR 2.01 09/07/2017   INR 2.90 09/03/2017    Anticoagulation Dosing: Description   Take 3 of your 5mg  peach-colored warfarin tablets by mouth, once-daily, at [facility] each day.      INR today: Subtherapeutic  PLAN Weekly dose was deferred at this time--with explanation of having missed 10mg  of a total daily dose of 15mg , 2 days ago. He is to go back to his scheduled regimen of 15mg  daily.   Patient Instructions  Patient instructed to take medications as defined in the Anti-coagulation Track section of this encounter.  Patient instructed to take today's dose.  Patient instructed to take 3 of your 5mg  peach-colored warfarin tablets by mouth, once-daily, at [facility] each day. Patient verbalized understanding of these instructions.     Patient advised to contact clinic or seek medical attention if signs/symptoms of bleeding or thromboembolism occur.  Patient verbalized understanding by repeating back information and was advised to contact me if further medication-related questions arise. Patient was also provided an information handout.  Follow-up Return in 1 month (on 10/29/2017) for Follow up INR at [place], PharmD, CACP, CPP  15 minutes spent face-to-face  with the patient during the encounter. 50% of time spent on education. 50% of time was spent on fingerstick point of care INR sample collection, processing, results determination, prescription refill authorization and transmission to [place] and documentation in [email or web address].

## 2017-10-02 NOTE — Progress Notes (Signed)
INTERNAL MEDICINE TEACHING ATTENDING ADDENDUM - [name] [name] M.D  Duration-indefinite, Indication-recurrent DVT/PE, INR-subtherapeutic. Agree with pharmacy recommendations as outlined in their note.

## 2017-10-29 ENCOUNTER — Ambulatory Visit: Payer: Self-pay

## 2017-11-02 ENCOUNTER — Ambulatory Visit (INDEPENDENT_AMBULATORY_CARE_PROVIDER_SITE_OTHER): Payer: Medicare Other | Admitting: Pharmacist

## 2017-11-02 DIAGNOSIS — I82401 Acute embolism and thrombosis of unspecified deep veins of right lower extremity: Secondary | ICD-10-CM | POA: Diagnosis not present

## 2017-11-02 DIAGNOSIS — Z5181 Encounter for therapeutic drug level monitoring: Secondary | ICD-10-CM | POA: Diagnosis not present

## 2017-11-02 DIAGNOSIS — Z86718 Personal history of other venous thrombosis and embolism: Secondary | ICD-10-CM

## 2017-11-02 DIAGNOSIS — Z7901 Long term (current) use of anticoagulants: Secondary | ICD-10-CM | POA: Diagnosis not present

## 2017-11-02 LAB — POCT INR: INR: 3.9

## 2017-11-02 NOTE — Patient Instructions (Signed)
Patient instructed to take medications as defined in the Anti-coagulation Track section of this encounter.  Patient instructed to hold tomorrow's dose as he has taken today's dose.  Patient verbalized understanding of these instructions.  Patient was instructed to hold tomorrow's dose (Saturday 4/27) and then continue previous dosing regimen which was to take 3 of your 5mg  peach-colored warfarin tablets by mouth, once-daily, at [facility] each day.

## 2017-11-02 NOTE — Progress Notes (Signed)
INTERNAL MEDICINE TEACHING ATTENDING ADDENDUM ° °I agree with pharmacy recommendations as outlined in their note.  ° °-Johany Hansman MD ° °

## 2017-11-02 NOTE — Progress Notes (Signed)
Anticoagulation Management [name] [name] is a 52 y.o. male who reports to the clinic for monitoring of warfarin treatment.    Indication: DVT  Duration: indefinite Supervising physician: Lalla Brothers  Anticoagulation Clinic Visit History: Patient does not report signs/symptoms of bleeding or thromboembolism  Other recent changes: Denies any diet, medications, lifestyle changes Anticoagulation Episode Summary    Current INR goal:   2.0-3.0  TTR:   57.4 % (2.6 y)  Next INR check:   11/16/2017  INR from last check:   3.9! 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 Xarelto [Rivaroxaban] Dermatitis    Blisters skin  . Clindamycin/Lincomycin Dermatitis    Severe rash/dermatitis from November 2015 still present Jan 2016 from clinda   Prior to Admission medications   Medication Sig Start Date End Date Taking? Authorizing Provider  acetaminophen (TYLENOL) 325 MG tablet Take 2 tablets (650 mg total) by mouth every 6 (six) hours as needed. 02/08/17   Jola Schmidt, MD  albuterol (PROVENTIL HFA;VENTOLIN HFA) 108 (90 Base) MCG/ACT inhaler Inhale 2 puffs into the lungs every 6 (six) hours as needed for wheezing or shortness of breath. 03/16/17   Thomasene Ripple, MD  albuterol (PROVENTIL) (2.5 MG/3ML) 0.083% nebulizer solution Take 3 mLs (2.5 mg total) by nebulization every 6 (six) hours as needed for wheezing or shortness of breath. 03/16/17   Thomasene Ripple, MD  cetirizine (ZYRTEC ALLERGY) 10 MG tablet Take 1 tablet (10 mg total) by mouth daily. 04/18/17   Thomasene Ripple,  MD  diclofenac sodium (VOLTAREN) 1 % GEL Apply 2 g topically 4 (four) times daily. Patient taking differently: Apply 2 g topically 4 (four) times daily as needed (pain).  01/19/16   Ophelia Shoulder, MD  DULoxetine (CYMBALTA) 60 MG capsule Take 1 capsule (60 mg total) by mouth daily. IM program 09/03/17   Thomasene Ripple, MD  furosemide (LASIX) 40 MG tablet Take 1 tablet (40 mg total) by mouth daily. 09/03/17   Thomasene Ripple, MD  gabapentin (NEURONTIN) 300 MG capsule Take 3 capsules (900 mg total) by mouth 3 (three) times daily. 09/03/17   Thomasene Ripple, MD  hydrocerin (EUCERIN) CREA Apply 1 application topically daily. 09/03/17   Thomasene Ripple, MD  hydrocortisone cream 1 % Apply 1 application topically 2 (two) times daily. 09/03/17   Thomasene Ripple, MD  ibuprofen (ADVIL,MOTRIN) 800 MG tablet Take 1 tablet (800 mg total) by mouth every 8 (eight) hours as needed. 06/26/17   Thomasene Ripple, MD  mometasone-formoterol (DULERA) 200-5 MCG/ACT AERO Inhale 2 puffs into the lungs 2 (two) times daily. 04/10/17   Thomasene Ripple, MD  nicotine (NICOTROL) 10 MG inhaler Inhale 1 cartridge (puff for 20 minutes) as needed. Use at least 6 cartridges/day the first 3-6 weeks, reduce gradually over 12 weeks 07/14/16   Lucious Groves, DO  oxyCODONE-acetaminophen (PERCOCET) 7.5-325 MG tablet Take 1 tablet by mouth every 4 (four) hours as needed for severe pain. 09/07/17  Delora Fuel, MD  pantoprazole (PROTONIX) 40 MG tablet Take 1 tablet (40 mg total) by mouth 2 (two) times daily. 09/03/17   Thomasene Ripple, MD  tiotropium (SPIRIVA HANDIHALER) 18 MCG inhalation capsule Place 1 capsule (18 mcg total) into inhaler and inhale once for 1 dose. 09/03/17 09/03/17  Thomasene Ripple, MD  traZODone (DESYREL) 100 MG tablet Take 1 tablet (100 mg total) by mouth at bedtime as needed for sleep. 09/03/17   Thomasene Ripple, MD  warfarin (COUMADIN) 5 MG tablet Take 3 tablets by mouth, once-daily at 6PM. IM Program. 10/01/17   Pennie Banter, RPH-CPP   Past Medical History:  Diagnosis Date  . Alcohol abuse   . Arthritis    knees  . Asthma    uses Albuterol daily as needed  . Bipolar 1 disorder ([place])   . Bronchitis    last time 2 yrs ago  . Cellulitis   . Chronic dermatitis    due to Xarelto Rx  . Cough    smokers  . Depression   . DVT (deep venous thrombosis) ([facility])    on Coumadin Rx  . DVT (deep venous thrombosis) ([place])   . GERD (gastroesophageal reflux disease)    only takes something about 2 times a yr  . GERD (gastroesophageal reflux disease)   . Heart murmur   . Joint pain   . Pulmonary embolism ([place]) 2014  . Shortness of breath   . Substance abuse ([place])    crack, cocaine, last use 2007  . Tobacco abuse   . Tuberculosis    ' I test Positive "   Social History   Socioeconomic History  . Marital status: Single    Spouse name: Not on file  . Number of children: Not on file  . Years of education: Not on file  . Highest education level: Not on file  Occupational History  . Occupation: Dentist: UNEMPLOYED  Social Needs  . Financial resource strain: Not on file  . Food insecurity:    Worry: Not on file    Inability: Not on file  . Transportation needs:    Medical: Not on file    Non-medical: Not on file  Tobacco Use  . Smoking status: Current Every Day Smoker    Packs/day: 0.20    Types: Cigarettes  . Smokeless tobacco: Former Systems developer  . Tobacco comment: "stopped chewing in 1990s" 2 cigs per day. Quitting  Substance and Sexual Activity  . Alcohol use: No  . Drug use: No    Types: Cocaine    Comment: last use 2016  . Sexual activity: Yes    Partners: Female  Lifestyle  . Physical activity:    Days per week: Not on file    Minutes per session: Not on file  . Stress: Not on file  Relationships  . Social connections:    Talks on phone: Not on file    Gets together: Not on file    Attends religious service: Not on file    Active member of club or organization: Not  on file    Attends meetings of clubs or organizations: Not on file    Relationship status: Not on file  Other Topics Concern  . Not on file  Social History Narrative   Working at [employer] now unemployed.  States he has insurance through them now.    Lives with mom and step dad  Family History  Problem Relation Age of Onset  . Asthma Mother   . Throat cancer Father     ASSESSMENT Recent Results: The most recent result is correlated with 105 mg per week: Lab Results  Component Value Date   INR 3.9 11/02/2017   INR 1.50 10/01/2017   INR 2.01 09/07/2017    Anticoagulation Dosing: Description   Take 3 of your 5mg  peach-colored warfarin tablets by mouth, once-daily, at [facility] each day.      INR today: Supratherapeutic  PLAN Weekly dose was unchanged overall, but he was instructed to hold his warfarin tomorrow 4/27 to lower his INR back to goal. He has been therapeutic on this TWD for several months prior with the exception of one month where he deviated from the dose when he ran out of coumadin. Return in two weeks to re-evaluate.  Patient Instructions  Patient instructed to take medications as defined in the Anti-coagulation Track section of this encounter.  Patient instructed to hold tomorrow's dose as he has taken today's dose.  Patient verbalized understanding of these instructions.  Patient was instructed to hold tomorrow's dose (Saturday 4/27) and then continue previous dosing regimen which was to take 3 of your 5mg  peach-colored warfarin tablets by mouth, once-daily, at [facility] each day.    Patient advised to contact clinic or seek medical attention if signs/symptoms of bleeding or thromboembolism occur.  Patient verbalized understanding by repeating back information and was advised to contact me if further medication-related questions arise. Patient was also provided an information handout.  Follow-up Return in about 2 weeks (around 11/16/2017) for INR  f/u on 5/10 @0930 .  Patterson Hammersmith PharmD PGY1 Pharmacy Practice Resident 11/02/2017 10:11 AM  15 minutes spent face-to-face with the patient during the encounter. 25% of time spent on education. 75% of time was spent on collection of INR, interpretation of results, and documentation.

## 2017-11-16 ENCOUNTER — Other Ambulatory Visit: Payer: Self-pay

## 2017-11-16 ENCOUNTER — Encounter (HOSPITAL_COMMUNITY): Payer: Self-pay | Admitting: Emergency Medicine

## 2017-11-16 ENCOUNTER — Ambulatory Visit (INDEPENDENT_AMBULATORY_CARE_PROVIDER_SITE_OTHER): Payer: Medicare Other | Admitting: Internal Medicine

## 2017-11-16 ENCOUNTER — Emergency Department (HOSPITAL_COMMUNITY): Payer: Medicare Other

## 2017-11-16 ENCOUNTER — Emergency Department (HOSPITAL_COMMUNITY)
Admission: EM | Admit: 2017-11-16 | Discharge: 2017-11-16 | Disposition: A | Discharge status: Home / Self Care | Payer: Medicare Other | Attending: Emergency Medicine | Admitting: Emergency Medicine

## 2017-11-16 ENCOUNTER — Ambulatory Visit (INDEPENDENT_AMBULATORY_CARE_PROVIDER_SITE_OTHER): Payer: Medicare Other | Admitting: Pharmacist

## 2017-11-16 VITALS — BP 124/87 | HR 72 | Temp 97.9°F | Wt 265.6 lb

## 2017-11-16 DIAGNOSIS — Z7951 Long term (current) use of inhaled steroids: Secondary | ICD-10-CM

## 2017-11-16 DIAGNOSIS — Z79899 Other long term (current) drug therapy: Secondary | ICD-10-CM | POA: Diagnosis not present

## 2017-11-16 DIAGNOSIS — Z86718 Personal history of other venous thrombosis and embolism: Secondary | ICD-10-CM

## 2017-11-16 DIAGNOSIS — R0602 Shortness of breath: Secondary | ICD-10-CM | POA: Diagnosis not present

## 2017-11-16 DIAGNOSIS — J441 Chronic obstructive pulmonary disease with (acute) exacerbation: Secondary | ICD-10-CM | POA: Diagnosis not present

## 2017-11-16 DIAGNOSIS — I82401 Acute embolism and thrombosis of unspecified deep veins of right lower extremity: Secondary | ICD-10-CM

## 2017-11-16 DIAGNOSIS — J449 Chronic obstructive pulmonary disease, unspecified: Secondary | ICD-10-CM

## 2017-11-16 DIAGNOSIS — Z7901 Long term (current) use of anticoagulants: Secondary | ICD-10-CM

## 2017-11-16 DIAGNOSIS — F17210 Nicotine dependence, cigarettes, uncomplicated: Secondary | ICD-10-CM | POA: Diagnosis not present

## 2017-11-16 DIAGNOSIS — Z5181 Encounter for therapeutic drug level monitoring: Secondary | ICD-10-CM | POA: Diagnosis not present

## 2017-11-16 DIAGNOSIS — J3089 Other allergic rhinitis: Secondary | ICD-10-CM

## 2017-11-16 DIAGNOSIS — Z5321 Procedure and treatment not carried out due to patient leaving prior to being seen by health care provider: Secondary | ICD-10-CM | POA: Diagnosis not present

## 2017-11-16 DIAGNOSIS — M5416 Radiculopathy, lumbar region: Secondary | ICD-10-CM

## 2017-11-16 DIAGNOSIS — M1711 Unilateral primary osteoarthritis, right knee: Secondary | ICD-10-CM

## 2017-11-16 LAB — POCT INR: INR: 2.3

## 2017-11-16 MED ORDER — ALBUTEROL SULFATE (2.5 MG/3ML) 0.083% IN NEBU
5.0000 mg | INHALATION_SOLUTION | Freq: Once | RESPIRATORY_TRACT | Status: AC
  Administered 2017-11-16: 5 mg via RESPIRATORY_TRACT
  Filled 2017-11-16: qty 6

## 2017-11-16 MED ORDER — PREDNISONE 20 MG PO TABS: ORAL_TABLET | ORAL | 0 refills | Status: DC

## 2017-11-16 MED FILL — WARFARIN SODIUM 5 MG TABLET: 5 | 28 days supply | Qty: 84 | Fill #1

## 2017-11-16 NOTE — Progress Notes (Signed)
   CC: Cough, palpitations, wheezing  HPI:  [name] [name] is a 52 y.o. male with PMHx detailed below presenting due to about 4 days of increased cough wheezing and shortness of breath.  During this time he is also been feeling heart palpitations which he is had often in the past.  He is also feeling some generalized body aches.  He has noticed increased seasonal allergy type symptoms for several weeks but was otherwise in his usual health.  He reports good adherence to his twice daily maintenance inhaler but has been needing to use his albuterol inhaler 4 or 5 times daily.   See problem based assessment and plan below for additional details.  COPD (chronic obstructive pulmonary disease) [facility]) [name] [name] has diffuse wheezing on examination is refractory to his current inhaled corticosteroid and albuterol.  This is consistent with an asthma exacerbation secondary either to acute viral upper respiratory infection or his worsening seasonal allergies.  Considering the severe symptoms despite a good inhaler regimen we will treat with systemic steroids. Plan: Start prednisone 40 mg daily x5 days then 20 mg daily x5 days Recommend starting daily intranasal steroids after finishing prednisone Return to clinic in 2 weeks if symptoms are not significantly improved   Past Medical History:  Diagnosis Date  . Alcohol abuse   . Arthritis    knees  . Asthma    uses Albuterol daily as needed  . Bipolar 1 disorder ([place])   . Bronchitis    last time 2 yrs ago  . Cellulitis   . Chronic dermatitis    due to Xarelto Rx  . Cough    smokers  . Depression   . DVT (deep venous thrombosis) ([facility])    on Coumadin Rx  . DVT (deep venous thrombosis) ([place])   . GERD (gastroesophageal reflux disease)    only takes something about 2 times a yr  . GERD (gastroesophageal reflux disease)   . Heart murmur   . Joint pain   . Pulmonary embolism ([place]) 2014  . Shortness of breath   . Substance abuse ([place])    crack,  cocaine, last use 2007  . Tobacco abuse   . Tuberculosis    ' I test Positive "    Review of Systems: Review of Systems  Constitutional: Negative for chills and fever.  HENT: Positive for congestion. Negative for sinus pain.   Eyes: Negative for blurred vision.  Respiratory: Positive for cough, shortness of breath and wheezing.   Cardiovascular: Positive for leg swelling. Negative for chest pain.  Gastrointestinal: Negative for diarrhea.  Skin: Positive for itching. Negative for rash.  Endo/Heme/Allergies: Positive for environmental allergies.     Physical Exam: Vitals:   11/16/17 0943  BP: 124/87  Pulse: 72  Temp: 97.9 F (36.6 C)  TempSrc: Oral  SpO2: 100%  Weight: 265 lb 9.6 oz (120.5 kg)   GENERAL- alert, co-operative, NAD HEENT- Atraumatic, oral mucosa appears moist, no oropharyngeal erythema, no lymphadenopathy CARDIAC- RRR, no murmurs, rubs or gallops. RESP-normal work of breathing, expiratory wheezes audible over all left lung fields and right lower lung field ABDOMEN- Soft, nontender EXTREMITIES-right pedal 1+ pitting edema with mild chronic stasis skin changes SKIN- Warm, dry, No rash or lesion. PSYCH- Normal mood and affect, appropriate thought content and speech.   Assessment & Plan:   See encounters tab for problem based medical decision making.   Patient discussed with Dr. Dareen Piano

## 2017-11-16 NOTE — Progress Notes (Signed)
INTERNAL MEDICINE TEACHING ATTENDING ADDENDUM - Dylan Monforte M.D  Duration- indefinite, Indication- recurrent DVT, PE, INR- therapeutic. Agree with pharmacy recommendations as outlined in their note.     

## 2017-11-16 NOTE — Patient Instructions (Signed)
Patient educated about medication as defined in this encounter and verbalized understanding by repeating back instructions provided.   

## 2017-11-16 NOTE — ED Triage Notes (Signed)
Pt c/o shortness of breath and wheezing x 3 days. Hx asthma, increased use of albuterol inhaler.

## 2017-11-16 NOTE — Patient Instructions (Signed)
I think you are having an exacerbation of your asthma either due to increased seasonal allergies or possibly a viral respiratory infection. I will prescribe a course of steroids start at 40mg  (2 tablets) daily for 5 days then 20mg  for 5 days and hopefully symptoms should be getting much better. After you finish these you can try starting an intranasal steroid such as flonase for your allergies if symptoms persist.  I suspect you pain is a combination of muscle, nerve, and joint issues. You certainly have right knee arthritis. This often benefits from strengthening the thigh muscles to take off weight. You can also start some stretches to help the hip flexibility avoid nerve impingement.  We can plan to see you back in about 2 weeks if you are still feeling worse than usual after the treatment.

## 2017-11-16 NOTE — Progress Notes (Signed)
Anticoagulation Management [name] [name] is a 52 y.o. male who reports to the clinic for monitoring of warfarin treatment.    Indication: DVT  Duration: indefinite Supervising physician: Aldine Contes  Anticoagulation Clinic Visit History: Patient does not report signs/symptoms of bleeding or thromboembolism  Other recent changes: Denies any diet, medications, lifestyle changes  Anticoagulation Episode Summary    Current INR goal:   2.0-3.0  TTR:   57.2 % (2.6 y)  Next INR check:   12/14/2017  INR from last check:   2.3 (11/16/2017)  Weekly max warfarin dose:     Target end date:     INR check location:     Preferred lab:     Send INR reminders to:   ANTICOAG IMP   Indications   Right leg DVT ([place]) [I82.401] Long-term (current) use of anticoagulants [Z79.01] DVT (Resolved) [I82.409]       Comments:         Anticoagulation Care Providers    Provider Role Specialty Phone number   Bartholomew Crews, MD  Internal Medicine [phone]      Allergies  Allergen Reactions  . Xarelto [Rivaroxaban] Dermatitis    Blisters skin  . Clindamycin/Lincomycin Dermatitis    Severe rash/dermatitis from November 2015 still present Jan 2016 from clinda   Medication Sig  acetaminophen (TYLENOL) 325 MG tablet Take 2 tablets (650 mg total) by mouth every 6 (six) hours as needed.  albuterol (PROVENTIL HFA;VENTOLIN HFA) 108 (90 Base) MCG/ACT inhaler Inhale 2 puffs into the lungs every 6 (six) hours as needed for wheezing or shortness of breath.  albuterol (PROVENTIL) (2.5 MG/3ML) 0.083% nebulizer solution Take 3 mLs (2.5 mg total) by nebulization every 6 (six) hours as needed for wheezing or shortness of breath.  cetirizine (ZYRTEC ALLERGY) 10 MG tablet Take 1 tablet (10 mg total) by mouth daily.  diclofenac sodium (VOLTAREN) 1 % GEL Apply 2 g topically 4 (four) times daily. Patient taking differently: Apply 2 g topically 4 (four) times daily as needed (pain).   DULoxetine (CYMBALTA) 60  MG capsule Take 1 capsule (60 mg total) by mouth daily. IM program  furosemide (LASIX) 40 MG tablet Take 1 tablet (40 mg total) by mouth daily.  gabapentin (NEURONTIN) 300 MG capsule Take 3 capsules (900 mg total) by mouth 3 (three) times daily.  hydrocerin (EUCERIN) CREA Apply 1 application topically daily.  hydrocortisone cream 1 % Apply 1 application topically 2 (two) times daily.  ibuprofen (ADVIL,MOTRIN) 800 MG tablet Take 1 tablet (800 mg total) by mouth every 8 (eight) hours as needed.  mometasone-formoterol (DULERA) 200-5 MCG/ACT AERO Inhale 2 puffs into the lungs 2 (two) times daily.  nicotine (NICOTROL) 10 MG inhaler Inhale 1 cartridge (puff for 20 minutes) as needed. Use at least 6 cartridges/day the first 3-6 weeks, reduce gradually over 12 weeks  oxyCODONE-acetaminophen (PERCOCET) 7.5-325 MG tablet Take 1 tablet by mouth every 4 (four) hours as needed for severe pain.  pantoprazole (PROTONIX) 40 MG tablet Take 1 tablet (40 mg total) by mouth 2 (two) times daily.  predniSONE (DELTASONE) 20 MG tablet Take 1 tablet (20 mg total) by mouth daily with breakfast for 5 days, THEN 1 tablet (20 mg total) daily with breakfast for 5 days.  tiotropium (SPIRIVA HANDIHALER) 18 MCG inhalation capsule Place 1 capsule (18 mcg total) into inhaler and inhale once for 1 dose.  traZODone (DESYREL) 100 MG tablet Take 1 tablet (100 mg total) by mouth at bedtime as needed for sleep.  warfarin (  COUMADIN) 5 MG tablet Take 3 tablets by mouth, once-daily at 6PM. IM Program.   Past Medical History:  Diagnosis Date  . Alcohol abuse   . Arthritis    knees  . Asthma    uses Albuterol daily as needed  . Bipolar 1 disorder ([place])   . Bronchitis    last time 2 yrs ago  . Cellulitis   . Chronic dermatitis    due to Xarelto Rx  . Cough    smokers  . Depression   . DVT (deep venous thrombosis) ([facility])    on Coumadin Rx  . DVT (deep venous thrombosis) ([place])   . GERD (gastroesophageal reflux disease)    only  takes something about 2 times a yr  . GERD (gastroesophageal reflux disease)   . Heart murmur   . Joint pain   . Pulmonary embolism ([place]) 2014  . Shortness of breath   . Substance abuse ([place])    crack, cocaine, last use 2007  . Tobacco abuse   . Tuberculosis    ' I test Positive "   Social History   Socioeconomic History  . Marital status: Single    Spouse name: Not on file  . Number of children: Not on file  . Years of education: Not on file  . Highest education level: Not on file  Occupational History  . Occupation: Dentist: UNEMPLOYED  Social Needs  . Financial resource strain: Not on file  . Food insecurity:    Worry: Not on file    Inability: Not on file  . Transportation needs:    Medical: Not on file    Non-medical: Not on file  Tobacco Use  . Smoking status: Current Every Day Smoker    Packs/day: 0.25    Types: Cigarettes  . Smokeless tobacco: Former Systems developer  . Tobacco comment: "stopped chewing in 1990s" 2 cigs per day. Quitting  Substance and Sexual Activity  . Alcohol use: No  . Drug use: No    Types: Cocaine    Comment: last use 2016  . Sexual activity: Yes    Partners: Female  Lifestyle  . Physical activity:    Days per week: Not on file    Minutes per session: Not on file  . Stress: Not on file  Relationships  . Social connections:    Talks on phone: Not on file    Gets together: Not on file    Attends religious service: Not on file    Active member of club or organization: Not on file    Attends meetings of clubs or organizations: Not on file    Relationship status: Not on file  Other Topics Concern  . Not on file  Social History Narrative   Working at [employer] now unemployed.  States he has insurance through them now.    Lives with mom and step dad          Family History  Problem Relation Age of Onset  . Asthma Mother   . Throat cancer Father     ASSESSMENT Recent Results: Lab Results  Component Value  Date   INR 2.3 11/16/2017   INR 3.9 11/02/2017   INR 1.50 10/01/2017   Anticoagulation Dosing: Description   Take 3 of your 5mg  peach-colored warfarin tablets by mouth, once-daily, at [facility] each day.      INR today: Therapeutic  PLAN Weekly dose was unchanged  There are no Patient Instructions on  file for this visit. Patient advised to contact clinic or seek medical attention if signs/symptoms of bleeding or thromboembolism occur.  Patient verbalized understanding by repeating back information and was advised to contact me if further medication-related questions arise. Patient was also provided an information handout.  Follow-up Return in about 1 month (around 12/14/2017).  Flossie Dibble

## 2017-11-16 NOTE — ED Notes (Signed)
Pt decided to leave 

## 2017-11-19 ENCOUNTER — Other Ambulatory Visit: Payer: Self-pay

## 2017-11-19 ENCOUNTER — Emergency Department (HOSPITAL_COMMUNITY)
Admission: EM | Admit: 2017-11-19 | Discharge: 2017-11-19 | Disposition: A | Discharge status: Home / Self Care | Payer: Medicare Other | Attending: Emergency Medicine | Admitting: Emergency Medicine

## 2017-11-19 ENCOUNTER — Encounter (HOSPITAL_COMMUNITY): Payer: Self-pay

## 2017-11-19 ENCOUNTER — Emergency Department (HOSPITAL_COMMUNITY): Payer: Medicare Other

## 2017-11-19 ENCOUNTER — Ambulatory Visit: Payer: Medicare Other | Admitting: Internal Medicine

## 2017-11-19 DIAGNOSIS — R05 Cough: Secondary | ICD-10-CM | POA: Diagnosis not present

## 2017-11-19 DIAGNOSIS — Z79899 Other long term (current) drug therapy: Secondary | ICD-10-CM | POA: Diagnosis not present

## 2017-11-19 DIAGNOSIS — R079 Chest pain, unspecified: Secondary | ICD-10-CM | POA: Diagnosis not present

## 2017-11-19 DIAGNOSIS — J4541 Moderate persistent asthma with (acute) exacerbation: Secondary | ICD-10-CM | POA: Diagnosis not present

## 2017-11-19 DIAGNOSIS — F17210 Nicotine dependence, cigarettes, uncomplicated: Secondary | ICD-10-CM | POA: Insufficient documentation

## 2017-11-19 DIAGNOSIS — R0602 Shortness of breath: Secondary | ICD-10-CM | POA: Diagnosis not present

## 2017-11-19 DIAGNOSIS — J45901 Unspecified asthma with (acute) exacerbation: Secondary | ICD-10-CM

## 2017-11-19 LAB — BASIC METABOLIC PANEL
Anion gap: 8 (ref 5–15)
BUN: 9 mg/dL (ref 6–20)
CHLORIDE: 108 mmol/L (ref 101–111)
CO2: 24 mmol/L (ref 22–32)
Calcium: 8.9 mg/dL (ref 8.9–10.3)
Creatinine, Ser: 0.99 mg/dL (ref 0.61–1.24)
GFR calc Af Amer: >60 mL/min (ref >60)
GFR calc non Af Amer: >60 mL/min (ref >60)
Glucose, Bld: 135 mg/dL — ABNORMAL HIGH (ref 65–99)
POTASSIUM: 3.9 mmol/L (ref 3.5–5.1)
SODIUM: 140 mmol/L (ref 135–145)

## 2017-11-19 LAB — CBC
HEMATOCRIT: 46.3 % (ref 39.0–52.0)
Hemoglobin: 15.7 g/dL (ref 13.0–17.0)
MCH: 32.4 pg (ref 26.0–34.0)
MCHC: 33.9 g/dL (ref 30.0–36.0)
MCV: 95.7 fL (ref 78.0–100.0)
PLATELETS: 197 10*3/uL (ref 150–400)
RBC: 4.84 MIL/uL (ref 4.22–5.81)
RDW: 15.1 % (ref 11.5–15.5)
WBC: 11.8 10*3/uL — AB (ref 4.0–10.5)

## 2017-11-19 MED ORDER — ALBUTEROL (5 MG/ML) CONTINUOUS INHALATION SOLN
10.0000 mg/h | INHALATION_SOLUTION | Freq: Once | RESPIRATORY_TRACT | Status: AC
  Administered 2017-11-19: 10 mg/h via RESPIRATORY_TRACT
  Filled 2017-11-19: qty 20

## 2017-11-19 MED ORDER — ALBUTEROL SULFATE (2.5 MG/3ML) 0.083% IN NEBU
5.0000 mg | INHALATION_SOLUTION | Freq: Once | RESPIRATORY_TRACT | Status: AC
  Administered 2017-11-19: 5 mg via RESPIRATORY_TRACT
  Filled 2017-11-19: qty 6

## 2017-11-19 MED ORDER — PREDNISONE 20 MG PO TABS
60.0000 mg | ORAL_TABLET | Freq: Once | ORAL | Status: AC
  Administered 2017-11-19: 60 mg via ORAL
  Filled 2017-11-19: qty 3

## 2017-11-19 MED ORDER — PREDNISONE 50 MG PO TABS: 50.0000 mg | ORAL_TABLET | Freq: Every day | ORAL | 0 refills | Status: DC

## 2017-11-19 MED ORDER — ALBUTEROL (5 MG/ML) CONTINUOUS INHALATION SOLN
5.0000 mg/h | INHALATION_SOLUTION | RESPIRATORY_TRACT | Status: DC
  Administered 2017-11-19: 5 mg/h via RESPIRATORY_TRACT

## 2017-11-19 NOTE — ED Notes (Signed)
Pt ambulated out with cane and steady gait, offered pt wheelchair, pt declined.

## 2017-11-19 NOTE — Discharge Instructions (Signed)
Please read attached information. If you experience any new or worsening signs or symptoms please return to the emergency room for evaluation. Please follow-up with your primary care provider or specialist as discussed. Please use medication prescribed only as directed and discontinue taking if you have any concerning signs or symptoms.   °

## 2017-11-19 NOTE — Progress Notes (Deleted)
CC: ***  HPI:  [name] is a 52 y.o.   Please see A&P for status of the patient's chronic medical conditions  Past Medical History:  Diagnosis Date  . Alcohol abuse   . Arthritis    knees  . Asthma    uses Albuterol daily as needed  . Bipolar 1 disorder ([name] [name])   . Bronchitis    last time 2 yrs ago  . Cellulitis   . Chronic dermatitis    due to Xarelto Rx  . Cough    smokers  . Depression   . DVT (deep venous thrombosis) ([facility])    on Coumadin Rx  . DVT (deep venous thrombosis) ([place])   . GERD (gastroesophageal reflux disease)    only takes something about 2 times a yr  . GERD (gastroesophageal reflux disease)   . Heart murmur   . Joint pain   . Pulmonary embolism ([place]) 2014  . Shortness of breath   . Substance abuse ([place])    crack, cocaine, last use 2007  . Tobacco abuse   . Tuberculosis    ' I test Positive "   Review of Systems:  ***  Physical Exam:  Vitals:   11/19/17 0927  BP: 133/86  Pulse: 79  Temp: 97.6 F (36.4 C)  TempSrc: Oral  SpO2: 100%  Weight: 274 lb 4.8 oz (124.4 kg)  Height: 6\' 4"  (1.93 m)   ***  Social History   Socioeconomic History  . Marital status: Single    Spouse name: Not on file  . Number of children: Not on file  . Years of education: Not on file  . Highest education level: Not on file  Occupational History  . Occupation: Dentist: UNEMPLOYED  Social Needs  . Financial resource strain: Not on file  . Food insecurity:    Worry: Not on file    Inability: Not on file  . Transportation needs:    Medical: Not on file    Non-medical: Not on file  Tobacco Use  . Smoking status: Current Every Day Smoker    Packs/day: 0.25    Types: Cigarettes  . Smokeless tobacco: Former Systems developer  . Tobacco comment: "stopped chewing in 1990s" 2 cigs per day. Quitting  Substance and Sexual Activity  . Alcohol use: No  . Drug use: No    Types: Cocaine    Comment: last use 2016  . Sexual activity: Yes   Partners: Female  Lifestyle  . Physical activity:    Days per week: Not on file    Minutes per session: Not on file  . Stress: Not on file  Relationships  . Social connections:    Talks on phone: Not on file    Gets together: Not on file    Attends religious service: Not on file    Active member of club or organization: Not on file    Attends meetings of clubs or organizations: Not on file    Relationship status: Not on file  . Intimate partner violence:    Fear of current or ex partner: Not on file    Emotionally abused: Not on file    Physically abused: Not on file    Forced sexual activity: Not on file  Other Topics Concern  . Not on file  Social History Narrative   Working at [employer] now unemployed.  States he has insurance through them now.    Lives with mom and  step dad          *** Family History  Problem Relation Age of Onset  . Asthma Mother   . Throat cancer Father     Assessment & Plan:   See Encounters Tab for problem based charting.  Patient {GC/GE:[id number]::"discussed with","seen with"} Dr. {NAMES:[id number]::"Butcher","Granfortuna","E. Hoffman","Klima","Mullen","Narendra","Raines","Vincent"}

## 2017-11-19 NOTE — ED Notes (Signed)
Educate pt about importance of stopping smoking and correlation with asthma.
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
## 2017-11-19 NOTE — ED Triage Notes (Signed)
Pt brought up from urgent care with complaint of cough, shortness of breath, and chest pain for several days. Pt states slight activities cause him to be SOB. Pt very hoarse in triage, wife reports coughing up large amounts of phlegm.

## 2017-11-19 NOTE — Progress Notes (Signed)
Internal Medicine Clinic Attending  Case discussed with Dr. Rice at the time of the visit.  We reviewed the resident's history and exam and pertinent patient test results.  I agree with the assessment, diagnosis, and plan of care documented in the resident's note.  

## 2017-11-19 NOTE — Assessment & Plan Note (Signed)
[name] [name] has diffuse wheezing on examination is refractory to his current inhaled corticosteroid and albuterol.  This is consistent with an asthma exacerbation secondary either to acute viral upper respiratory infection or his worsening seasonal allergies.  Considering the severe symptoms despite a good inhaler regimen we will treat with systemic steroids. Plan: Start prednisone 40 mg daily x5 days then 20 mg daily x5 days Recommend starting daily intranasal steroids after finishing prednisone Return to clinic in 2 weeks if symptoms are not significantly improved

## 2017-11-20 NOTE — Progress Notes (Signed)
Could not close encounter, pt was seen by Dr. Benjamine Mola, no way to delete encounter with multiple attempts.

## 2017-11-21 ENCOUNTER — Emergency Department (HOSPITAL_COMMUNITY): Payer: Medicare Other

## 2017-11-21 ENCOUNTER — Observation Stay (HOSPITAL_COMMUNITY)
Admission: EM | Admit: 2017-11-21 | Discharge: 2017-11-23 | Disposition: A | Discharge status: Home / Self Care | Payer: Medicare Other | Attending: Internal Medicine | Admitting: Internal Medicine

## 2017-11-21 ENCOUNTER — Other Ambulatory Visit: Payer: Self-pay

## 2017-11-21 ENCOUNTER — Encounter (HOSPITAL_COMMUNITY): Payer: Self-pay | Admitting: Internal Medicine

## 2017-11-21 DIAGNOSIS — R490 Dysphonia: Secondary | ICD-10-CM | POA: Insufficient documentation

## 2017-11-21 DIAGNOSIS — K219 Gastro-esophageal reflux disease without esophagitis: Secondary | ICD-10-CM | POA: Diagnosis not present

## 2017-11-21 DIAGNOSIS — R05 Cough: Secondary | ICD-10-CM | POA: Diagnosis not present

## 2017-11-21 DIAGNOSIS — Z79899 Other long term (current) drug therapy: Secondary | ICD-10-CM | POA: Diagnosis not present

## 2017-11-21 DIAGNOSIS — M170 Bilateral primary osteoarthritis of knee: Secondary | ICD-10-CM | POA: Insufficient documentation

## 2017-11-21 DIAGNOSIS — F17200 Nicotine dependence, unspecified, uncomplicated: Secondary | ICD-10-CM | POA: Diagnosis present

## 2017-11-21 DIAGNOSIS — F17210 Nicotine dependence, cigarettes, uncomplicated: Secondary | ICD-10-CM | POA: Diagnosis not present

## 2017-11-21 DIAGNOSIS — Z888 Allergy status to other drugs, medicaments and biological substances status: Secondary | ICD-10-CM

## 2017-11-21 DIAGNOSIS — J383 Other diseases of vocal cords: Secondary | ICD-10-CM | POA: Diagnosis not present

## 2017-11-21 DIAGNOSIS — R011 Cardiac murmur, unspecified: Secondary | ICD-10-CM | POA: Insufficient documentation

## 2017-11-21 DIAGNOSIS — R042 Hemoptysis: Secondary | ICD-10-CM

## 2017-11-21 DIAGNOSIS — Z7951 Long term (current) use of inhaled steroids: Secondary | ICD-10-CM | POA: Diagnosis not present

## 2017-11-21 DIAGNOSIS — M5416 Radiculopathy, lumbar region: Secondary | ICD-10-CM | POA: Insufficient documentation

## 2017-11-21 DIAGNOSIS — J392 Other diseases of pharynx: Secondary | ICD-10-CM | POA: Diagnosis not present

## 2017-11-21 DIAGNOSIS — I872 Venous insufficiency (chronic) (peripheral): Secondary | ICD-10-CM | POA: Insufficient documentation

## 2017-11-21 DIAGNOSIS — J9600 Acute respiratory failure, unspecified whether with hypoxia or hypercapnia: Secondary | ICD-10-CM | POA: Diagnosis not present

## 2017-11-21 DIAGNOSIS — Z86718 Personal history of other venous thrombosis and embolism: Secondary | ICD-10-CM | POA: Diagnosis not present

## 2017-11-21 DIAGNOSIS — Z86711 Personal history of pulmonary embolism: Secondary | ICD-10-CM | POA: Diagnosis not present

## 2017-11-21 DIAGNOSIS — J441 Chronic obstructive pulmonary disease with (acute) exacerbation: Principal | ICD-10-CM

## 2017-11-21 DIAGNOSIS — I82501 Chronic embolism and thrombosis of unspecified deep veins of right lower extremity: Secondary | ICD-10-CM | POA: Diagnosis present

## 2017-11-21 DIAGNOSIS — Z881 Allergy status to other antibiotic agents status: Secondary | ICD-10-CM | POA: Diagnosis not present

## 2017-11-21 DIAGNOSIS — Z8611 Personal history of tuberculosis: Secondary | ICD-10-CM | POA: Diagnosis not present

## 2017-11-21 DIAGNOSIS — J4541 Moderate persistent asthma with (acute) exacerbation: Secondary | ICD-10-CM | POA: Diagnosis not present

## 2017-11-21 DIAGNOSIS — R0682 Tachypnea, not elsewhere classified: Secondary | ICD-10-CM | POA: Diagnosis not present

## 2017-11-21 DIAGNOSIS — Z825 Family history of asthma and other chronic lower respiratory diseases: Secondary | ICD-10-CM

## 2017-11-21 DIAGNOSIS — F319 Bipolar disorder, unspecified: Secondary | ICD-10-CM | POA: Insufficient documentation

## 2017-11-21 DIAGNOSIS — Z7901 Long term (current) use of anticoagulants: Secondary | ICD-10-CM | POA: Diagnosis not present

## 2017-11-21 DIAGNOSIS — R0602 Shortness of breath: Secondary | ICD-10-CM | POA: Diagnosis not present

## 2017-11-21 LAB — BASIC METABOLIC PANEL
Anion gap: 11 (ref 5–15)
BUN: 16 mg/dL (ref 6–20)
CALCIUM: 9.2 mg/dL (ref 8.9–10.3)
CO2: 24 mmol/L (ref 22–32)
Chloride: 104 mmol/L (ref 101–111)
Creatinine, Ser: 1.28 mg/dL — ABNORMAL HIGH (ref 0.61–1.24)
GFR calc Af Amer: >60 mL/min (ref >60)
GLUCOSE: 158 mg/dL — AB (ref 65–99)
Potassium: 4.4 mmol/L (ref 3.5–5.1)
Sodium: 139 mmol/L (ref 135–145)

## 2017-11-21 LAB — HEMOGLOBIN A1C
Hgb A1c MFr Bld: 6.1 % — ABNORMAL HIGH (ref 4.8–5.6)
Mean Plasma Glucose: 128.37 mg/dL

## 2017-11-21 LAB — PROTIME-INR
INR: 2.92
Prothrombin Time: 30.3 seconds — ABNORMAL HIGH (ref 11.4–15.2)

## 2017-11-21 LAB — CBC WITH DIFFERENTIAL/PLATELET
Abs Immature Granulocytes: 0.2 10*3/uL — ABNORMAL HIGH (ref 0.0–0.1)
BASOS PCT: 0 %
Basophils Absolute: 0 10*3/uL (ref 0.0–0.1)
EOS ABS: 0 10*3/uL (ref 0.0–0.7)
EOS PCT: 0 %
HCT: 45.9 % (ref 39.0–52.0)
Hemoglobin: 15.1 g/dL (ref 13.0–17.0)
Immature Granulocytes: 2 %
Lymphocytes Relative: 12 %
Lymphs Abs: 1.2 10*3/uL (ref 0.7–4.0)
MCH: 31.1 pg (ref 26.0–34.0)
MCHC: 32.9 g/dL (ref 30.0–36.0)
MCV: 94.4 fL (ref 78.0–100.0)
MONO ABS: 0.3 10*3/uL (ref 0.1–1.0)
MONOS PCT: 3 %
NEUTROS PCT: 83 %
Neutro Abs: 8.5 10*3/uL — ABNORMAL HIGH (ref 1.7–7.7)
PLATELETS: 217 10*3/uL (ref 150–400)
RBC: 4.86 MIL/uL (ref 4.22–5.81)
RDW: 14.5 % (ref 11.5–15.5)
WBC: 10.3 10*3/uL (ref 4.0–10.5)

## 2017-11-21 LAB — HIV ANTIBODY (ROUTINE TESTING W REFLEX): HIV Screen 4th Generation wRfx: NONREACTIVE

## 2017-11-21 MED ORDER — ACETAMINOPHEN 325 MG PO TABS
650.0000 mg | ORAL_TABLET | Freq: Four times a day (QID) | ORAL | Status: DC | PRN
  Administered 2017-11-22 – 2017-11-23 (×2): 650 mg via ORAL
  Filled 2017-11-21 (×2): qty 2

## 2017-11-21 MED ORDER — ALBUTEROL SULFATE (2.5 MG/3ML) 0.083% IN NEBU
5.0000 mg | INHALATION_SOLUTION | Freq: Once | RESPIRATORY_TRACT | Status: AC
  Administered 2017-11-21: 5 mg via RESPIRATORY_TRACT

## 2017-11-21 MED ORDER — AZITHROMYCIN 250 MG PO TABS
500.0000 mg | ORAL_TABLET | Freq: Every day | ORAL | Status: AC
  Administered 2017-11-21: 500 mg via ORAL
  Filled 2017-11-21: qty 2

## 2017-11-21 MED ORDER — MAGNESIUM SULFATE 2 GM/50ML IV SOLN
2.0000 g | Freq: Once | INTRAVENOUS | Status: AC
  Administered 2017-11-21: 2 g via INTRAVENOUS
  Filled 2017-11-21: qty 50

## 2017-11-21 MED ORDER — AZITHROMYCIN 250 MG PO TABS
250.0000 mg | ORAL_TABLET | Freq: Every day | ORAL | Status: DC
  Administered 2017-11-22 – 2017-11-23 (×2): 250 mg via ORAL
  Filled 2017-11-21 (×2): qty 1

## 2017-11-21 MED ORDER — METHYLPREDNISOLONE SODIUM SUCC 40 MG IJ SOLR: 40.0000 mg | Freq: Two times a day (BID) | INTRAMUSCULAR | Status: DC

## 2017-11-21 MED ORDER — IPRATROPIUM BROMIDE 0.02 % IN SOLN
0.5000 mg | Freq: Once | RESPIRATORY_TRACT | Status: AC
  Administered 2017-11-21: 0.5 mg via RESPIRATORY_TRACT
  Filled 2017-11-21: qty 2.5

## 2017-11-21 MED ORDER — ALBUTEROL SULFATE (2.5 MG/3ML) 0.083% IN NEBU
2.5000 mg | INHALATION_SOLUTION | RESPIRATORY_TRACT | Status: DC | PRN
Start: 2017-11-21 — End: 2017-11-23
  Administered 2017-11-21: 2.5 mg via RESPIRATORY_TRACT
  Filled 2017-11-21: qty 3

## 2017-11-21 MED ORDER — ALBUTEROL SULFATE (2.5 MG/3ML) 0.083% IN NEBU
5.0000 mg | INHALATION_SOLUTION | Freq: Once | RESPIRATORY_TRACT | Status: DC
  Filled 2017-11-21: qty 6

## 2017-11-21 MED ORDER — MOMETASONE FURO-FORMOTEROL FUM 200-5 MCG/ACT IN AERO
2.0000 | INHALATION_SPRAY | Freq: Two times a day (BID) | RESPIRATORY_TRACT | Status: DC
  Administered 2017-11-21 – 2017-11-23 (×4): 2 via RESPIRATORY_TRACT
  Filled 2017-11-21: qty 8.8

## 2017-11-21 MED ORDER — PREDNISONE 20 MG PO TABS
40.0000 mg | ORAL_TABLET | Freq: Every day | ORAL | Status: DC
  Administered 2017-11-22 – 2017-11-23 (×2): 40 mg via ORAL
  Filled 2017-11-21 (×2): qty 2

## 2017-11-21 MED ORDER — IPRATROPIUM-ALBUTEROL 0.5-2.5 (3) MG/3ML IN SOLN
3.0000 mL | RESPIRATORY_TRACT | Status: DC
  Administered 2017-11-21 – 2017-11-23 (×13): 3 mL via RESPIRATORY_TRACT
  Filled 2017-11-21 (×12): qty 3

## 2017-11-21 MED ORDER — ALBUTEROL (5 MG/ML) CONTINUOUS INHALATION SOLN
15.0000 mg/h | INHALATION_SOLUTION | Freq: Once | RESPIRATORY_TRACT | Status: AC
  Administered 2017-11-21: 15 mg/h via RESPIRATORY_TRACT
  Filled 2017-11-21: qty 20

## 2017-11-21 MED ORDER — TRAZODONE HCL 100 MG PO TABS
100.0000 mg | ORAL_TABLET | Freq: Every evening | ORAL | Status: DC | PRN
  Administered 2017-11-22: 100 mg via ORAL
  Filled 2017-11-21: qty 1

## 2017-11-21 MED ORDER — PANTOPRAZOLE SODIUM 40 MG PO TBEC
40.0000 mg | DELAYED_RELEASE_TABLET | Freq: Two times a day (BID) | ORAL | Status: DC
  Administered 2017-11-21 – 2017-11-23 (×5): 40 mg via ORAL
  Filled 2017-11-21 (×5): qty 1

## 2017-11-21 MED ORDER — IPRATROPIUM-ALBUTEROL 0.5-2.5 (3) MG/3ML IN SOLN
3.0000 mL | Freq: Four times a day (QID) | RESPIRATORY_TRACT | Status: DC
  Administered 2017-11-21: 3 mL via RESPIRATORY_TRACT
  Filled 2017-11-21: qty 3

## 2017-11-21 MED ORDER — GABAPENTIN 300 MG PO CAPS
900.0000 mg | ORAL_CAPSULE | Freq: Three times a day (TID) | ORAL | Status: DC
  Administered 2017-11-21 – 2017-11-23 (×7): 900 mg via ORAL
  Filled 2017-11-21 (×7): qty 3

## 2017-11-21 MED ORDER — ACETAMINOPHEN 650 MG RE SUPP: 650.0000 mg | Freq: Four times a day (QID) | RECTAL | Status: DC | PRN

## 2017-11-21 MED ORDER — WARFARIN SODIUM 7.5 MG PO TABS
15.0000 mg | ORAL_TABLET | Freq: Once | ORAL | Status: AC
  Administered 2017-11-21: 15 mg via ORAL
  Filled 2017-11-21 (×2): qty 2

## 2017-11-21 MED ORDER — DULOXETINE HCL 60 MG PO CPEP
60.0000 mg | ORAL_CAPSULE | Freq: Every day | ORAL | Status: DC
  Administered 2017-11-21 – 2017-11-23 (×3): 60 mg via ORAL
  Filled 2017-11-21 (×4): qty 1

## 2017-11-21 MED ORDER — METHYLPREDNISOLONE SODIUM SUCC 125 MG IJ SOLR
125.0000 mg | Freq: Once | INTRAMUSCULAR | Status: AC
  Administered 2017-11-21: 125 mg via INTRAVENOUS
  Filled 2017-11-21: qty 2

## 2017-11-21 MED ORDER — WARFARIN - PHARMACIST DOSING INPATIENT
Freq: Every day | Status: DC
  Administered 2017-11-21 – 2017-11-22 (×2)

## 2017-11-21 NOTE — ED Triage Notes (Signed)
Per pt he has been having an asthma flare up for about 1 week now. Pt stated he is having a harder time moving air out. Pt has wheezing in top and lower lobes. Pt stated when he coughed he coughed up some blood.

## 2017-11-21 NOTE — ED Notes (Signed)
Pt lungs have wheezing throughout.

## 2017-11-21 NOTE — ED Notes (Signed)
Admitting MD were paged about pt wheezing after duo and prn breathing tx and need for additional interventions.

## 2017-11-21 NOTE — H&P (Signed)
Date: 11/21/2017               Patient Name:  [name] [name] MRN: [id number]  DOB: [birth year] Age / Sex: 52 y.o., male   PCP: Thomasene Ripple, MD         Medical Service: Internal Medicine Teaching Service         Attending Physician: Dr. Merryl Hacker, MD    First Contact: Dr. Aggie Hacker Pager: [phone]  Second Contact: Dr. Reesa Chew Pager: [phone]       After Hours (After 5p/  First Contact Pager: [phone]  weekends / holidays): Second Contact Pager: [phone]   Chief Complaint: Shortness of breath  History of Present Illness: Mr. Guillette is a 52 yo M with a past medical history of asthma/COPD, DVT, GERD who presented to the ED with complaints of shortness of breath.  He reports a six day history of progressive shortness of breath. He has had associated cough productive of phlegm with associated soreness to abdomen and lateral chest wall due to coughing. This morning he also noted streaks of blood in his cough. He denies fever, sick contacts, diaphoresis, nausea, change in leg swelling, other signs of bleeding, no decreased PO intake. He reports adherence with home albuterol (inhaler and nebulizer) which he has been using more frequently and maintenance dulera, not currently on spiriva. Prior to onset of current sx, he used his albuterol twice daily on average. He was seen in clinic on 5/10, noted to have diffuse wheezing on exam.  At that time he was started on steroids with prednisone 40 mg daily x5 days followed by 20 mg x 5 days. He was then see in the ED 5/13 for persistent sx and his prednisone was increased to 50 mg. He did not experience any noticeable improvements with initiation of steroids.   In the ED, T 97.9, HR 94, BP 127/83, 99% on room air.  Labs largely unremarkable apart from creatinine of 1.28. He received 125 mg Solu-medrol, breathing treatments, magnesium and was admitted for further management.   Meds:  No outpatient medications have been marked as taking for the  11/21/17 encounter [facility] Encounter).     Allergies: Allergies as of 11/21/2017 - Review Complete 11/21/2017  Allergen Reaction Noted  . Xarelto [rivaroxaban] Dermatitis 09/28/2014  . Clindamycin/lincomycin Dermatitis 07/22/2014   Past Medical History:  Diagnosis Date  . Alcohol abuse   . Arthritis    knees  . Asthma    uses Albuterol daily as needed  . Bipolar 1 disorder ([place])   . Bronchitis    last time 2 yrs ago  . Cellulitis   . Chronic dermatitis    due to Xarelto Rx  . Cough    smokers  . Depression   . DVT (deep venous thrombosis) (HCC)    on Coumadin Rx  . DVT (deep venous thrombosis) ([place])   . GERD (gastroesophageal reflux disease)    only takes something about 2 times a yr  . GERD (gastroesophageal reflux disease)   . Heart murmur   . Joint pain   . Pulmonary embolism ([place]) 2014  . Shortness of breath   . Substance abuse ([place])    crack, cocaine, last use 2007  . Tobacco abuse   . Tuberculosis    ' I test Positive "    Family History:  Family History  Problem Relation Age of Onset  . Asthma Mother   . Throat cancer Father  Social History:  Social History   Tobacco Use  . Smoking status: Current Every Day Smoker    Packs/day: 0.25    Types: Cigarettes  . Smokeless tobacco: Former Systems developer  . Tobacco comment: "stopped chewing in 1990s" 2 cigs per day. Quitting  Substance Use Topics  . Alcohol use: No  . Drug use: No    Types: Cocaine    Comment: last use 2016     Review of Systems: A complete ROS was negative except as per HPI.   Physical Exam: Blood pressure 127/83, pulse 94, temperature 97.9 F (36.6 C), temperature source Oral, resp. rate 17, height 6\' 4"  (1.93 m), weight 272 lb (123.4 kg), SpO2 99 %. General: Resting on ED stretcher, intermittently coughing throughout exam Head: Normocephalic, atraumatic Eyes: PERRL, EOMI ENT: No pharyngeal exudate, moist mucus membranes  CV: RRR, no murmur appreciated  Resp: Mildly increase  work of breathing esp during periods of cough, diffuse expiratory wheezing in all lung fields   Abd: Soft, +BS, non-tender to palpation  Extr: Mild R LE edema, no calf tenderness  Neuro: Alert and oriented x3, moving all extremities spontaneously  Skin: Warm, dry    EKG: personally reviewed my interpretation is sinus rhythm with normal rate, inverted T waves in V4-V6, fairly similar to prior.   CXR: personally reviewed my interpretation is no effusions, no focal consolidation, normal heart size. Mild increase in interstitial markings.   Assessment & Plan by Problem:  Asthma/COPD Exacerbation Presenting with persistent SOB and wheezing after outpatient management with oral steroids and with home inhalers/nebulizer. No evidence of pna on CXR, afebrile. Both COPD and asthma have been documented for his obstructive lung disease in the past. PFTs performed 12/2016 which showed obstructive pattern with good bronchodilator response. Will continue IV steroids given lack of response to oral steroids, also cont breathing treatments. Not currently requiring oxygen.  --Monitor vital signs, pulse ox --Duonebs q6hrs, albuterol nebulizer q4hr prn  --IV Solumedrol 40 mg BID  --Cont maintenance inhaler equivalent --Supplemental O2 prn    H/o DVT Reports multiple DVTs in the past and is on long term anticoagulation with Warfarin, INR 2.92. Feel PE less likely given wheezing and therapeutic anti-coagulation. Hemoptysis likely due to pharyngeal irritation, no other signs of bleeding.   --Warfarin per pharm  H/o GERD Continue home PPI   Dispo: Admit patient to Observation with expected length of stay less than 2 midnights.  Signed: Tawny Asal, MD 11/21/2017, 5:49 AM  Pager: [phone]

## 2017-11-21 NOTE — Progress Notes (Signed)
   Subjective:  Patient seen and examined. States he feels worse than when he arrived to the ED this morning. Complaining of chest tightness ans shortness of breath. States his symptoms never improved since starting steroids as an outpatient several days ago. No precipitating event that he could think of.   Objective:  Vital signs in last 24 hours: Vitals:   11/21/17 0645 11/21/17 0700 11/21/17 0715 11/21/17 0725  BP: (!) 98/55 (!) 99/55 (!) 106/50   Pulse: 96 83 81   Resp: (!) 21 (!) 21 17   Temp:      TempSrc:      SpO2: 91% 92% 94% 94%  Weight:      Height:       General: Laying in bed comfortably, NAD HEENT: [place]/AT, EOMI, no scleral icterus Cardiac: Tachycardic, regular rhythm, No R/M/G appreciated Pulm: normal effort, good aeration, diffuse expiratory wheezing throughout all lung fields Abd: soft, non tender, distended, BS normal Ext: extremities well perfused, +1 pitting edema RLE (chronic) compared to LLE with trace edema Neuro: alert and oriented X3, cranial nerves II-XII grossly intact   Assessment/Plan:  Principal Problem:   Moderate persistent asthma with (acute) exacerbation Active Problems:   Chronic deep vein thrombosis (DVT) of right lower extremity (HCC)   Tobacco use disorder   Chronic lumbar radiculopathy   Chronic venous insufficiency   History of pulmonary embolism  COPD Exacerbation Presenting with persistent SOB and wheezing after failure of outpatient management with oral steroids and home inhalers/nebulizer. No evidence of PNA on CXR.  Patient is afebrile, saturating high 90s on RA. Lung exam with diffuse expiratory wheezing throughout all lung fields. Given smoking history and failure of outpatient steroids, will start azithromycin and treat for COPD exacerbation.  -Start Azithromycin 500 mg x 1, followed by 250 mg daily  -Duonebs q4hrs -Albuterol nebulizer q4hr prn for SOB and wheezing  -IV Solumedrol 40 mg BID  -Cont maintenance inhaler  equivalent -Supplemental O2 prn     H/o DVT Reports multiple DVTs in the past and is on long term anticoagulation with Warfarin, INR 2.92. Feel PE less likely given wheezing and therapeutic anti-coagulation. Hemoptysis likely due to pharyngeal irritation, no other signs of bleeding.   -Warfarin per pharm  GERD -Continue Protonix 40 mg BID  Tobacco Use -Will discuss smoking cessation with patient prior to discharge    Dispo: Anticipated discharge in approximately 1-2 day(s).   Melanee Spry, MD 11/21/2017, 9:25 AM Pager: [phone]

## 2017-11-21 NOTE — Progress Notes (Signed)
ANTICOAGULATION CONSULT NOTE - Initial Consult  Pharmacy Consult for coumadin Indication: DVT  Allergies  Allergen Reactions  . Xarelto [Rivaroxaban] Dermatitis    Blisters skin  . Clindamycin/Lincomycin Dermatitis    Severe rash/dermatitis from November 2015 still present Jan 2016 from [facility]    Patient Measurements: Height: 6\' 4"  (193 cm) Weight: 272 lb (123.4 kg) IBW/kg (Calculated) : 86.8   Vital Signs: Temp: 97.9 F (36.6 C) (05/15 0226) Temp Source: Oral (05/15 0226) BP: 97/61 (05/15 5929) Pulse Rate: 91 (05/15 0633)  Labs: Recent Labs    11/19/17 1011 11/21/17 0357  HGB 15.7 15.1  HCT 46.3 45.9  PLT 197 217  LABPROT  --  30.3*  INR  --  2.92  CREATININE 0.99 1.28*    Estimated Creatinine Clearance: 97.9 mL/min (A) (by C-G formula based on SCr of 1.28 mg/dL (H)).   Medical History: Past Medical History:  Diagnosis Date  . Alcohol abuse   . Arthritis    knees  . Asthma    uses Albuterol daily as needed  . Bipolar 1 disorder ([place])   . Bronchitis    last time 2 yrs ago  . Cellulitis   . Chronic dermatitis    due to Xarelto Rx  . Cough    smokers  . Depression   . DVT (deep venous thrombosis) (HCC)    on Coumadin Rx  . DVT (deep venous thrombosis) ([place])   . GERD (gastroesophageal reflux disease)    only takes something about 2 times a yr  . GERD (gastroesophageal reflux disease)   . Heart murmur   . Joint pain   . Pulmonary embolism ([place]) 2014  . Shortness of breath   . Substance abuse ([place])    crack, cocaine, last use 2007  . Tobacco abuse   . Tuberculosis    ' I test Positive "    Medications:  See medication history  Assessment: 52 yo man to continue coumadin for h/o DVT.  Admission INR is 2.92.  INR was 2.3 at office visit 11/16/17 Goal of Therapy:  INR 2-3 Monitor platelets by anticoagulation protocol: Yes   Plan:  Coumadin 15 mg po today at 1800 May need lower dose of coumadin if trend continue up. Daily PT/INR Monitor  for bleeding complications   Excell Seltzer [place] 11/21/2017,6:44 AM

## 2017-11-21 NOTE — ED Notes (Signed)
Admitting MD rounded on the pt.

## 2017-11-21 NOTE — ED Notes (Signed)
Pt coughing when trying to speak. No distress noted.
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
## 2017-11-22 DIAGNOSIS — K219 Gastro-esophageal reflux disease without esophagitis: Secondary | ICD-10-CM | POA: Diagnosis not present

## 2017-11-22 DIAGNOSIS — J441 Chronic obstructive pulmonary disease with (acute) exacerbation: Secondary | ICD-10-CM | POA: Diagnosis not present

## 2017-11-22 DIAGNOSIS — Z79899 Other long term (current) drug therapy: Secondary | ICD-10-CM | POA: Diagnosis not present

## 2017-11-22 DIAGNOSIS — F17210 Nicotine dependence, cigarettes, uncomplicated: Secondary | ICD-10-CM | POA: Diagnosis not present

## 2017-11-22 DIAGNOSIS — Z86718 Personal history of other venous thrombosis and embolism: Secondary | ICD-10-CM | POA: Diagnosis not present

## 2017-11-22 DIAGNOSIS — Z7901 Long term (current) use of anticoagulants: Secondary | ICD-10-CM | POA: Diagnosis not present

## 2017-11-22 LAB — CBC
HCT: 43.8 % (ref 39.0–52.0)
Hemoglobin: 14.6 g/dL (ref 13.0–17.0)
MCH: 31 pg (ref 26.0–34.0)
MCHC: 33.3 g/dL (ref 30.0–36.0)
MCV: 93 fL (ref 78.0–100.0)
Platelets: 223 10*3/uL (ref 150–400)
RBC: 4.71 MIL/uL (ref 4.22–5.81)
RDW: 13.9 % (ref 11.5–15.5)
WBC: 19.4 10*3/uL — ABNORMAL HIGH (ref 4.0–10.5)

## 2017-11-22 LAB — PROTIME-INR
INR: 3.19
Prothrombin Time: 32.4 seconds — ABNORMAL HIGH (ref 11.4–15.2)

## 2017-11-22 LAB — BASIC METABOLIC PANEL
ANION GAP: 9 (ref 5–15)
BUN: 16 mg/dL (ref 6–20)
CALCIUM: 9 mg/dL (ref 8.9–10.3)
CO2: 28 mmol/L (ref 22–32)
CREATININE: 1.01 mg/dL (ref 0.61–1.24)
Chloride: 103 mmol/L (ref 101–111)
GFR calc Af Amer: >60 mL/min (ref >60)
GFR calc non Af Amer: >60 mL/min (ref >60)
GLUCOSE: 152 mg/dL — AB (ref 65–99)
Potassium: 4.1 mmol/L (ref 3.5–5.1)
Sodium: 140 mmol/L (ref 135–145)

## 2017-11-22 MED ORDER — MONTELUKAST SODIUM 10 MG PO TABS: 10.0000 mg | ORAL_TABLET | Freq: Every day | ORAL | Status: DC

## 2017-11-22 MED ORDER — MONTELUKAST SODIUM 10 MG PO TABS
10.0000 mg | ORAL_TABLET | Freq: Every day | ORAL | Status: DC
  Administered 2017-11-22: 10 mg via ORAL
  Filled 2017-11-22: qty 1

## 2017-11-22 MED ORDER — WARFARIN SODIUM 7.5 MG PO TABS
7.5000 mg | ORAL_TABLET | Freq: Once | ORAL | Status: AC
  Administered 2017-11-22: 7.5 mg via ORAL
  Filled 2017-11-22: qty 1

## 2017-11-22 NOTE — Progress Notes (Addendum)
[facility] for coumadin Indication: DVT  Allergies  Allergen Reactions  . Xarelto [Rivaroxaban] Dermatitis    Blisters skin  . Clindamycin/Lincomycin Dermatitis    Severe rash/dermatitis from November 2015 still present Jan 2016 from clinda    Patient Measurements: Height: 6\' 4"  (193 cm) Weight: 271 lb 2.7 oz (123 kg) IBW/kg (Calculated) : 86.8   Vital Signs: Temp: 97.8 F (36.6 C) (05/16 0729) Temp Source: Oral (05/16 0729) BP: 120/83 (05/16 0729) Pulse Rate: 66 (05/16 0729)  Labs: Recent Labs    11/21/17 0357 11/22/17 0256  HGB 15.1 14.6  HCT 45.9 43.8  PLT 217 223  LABPROT 30.3* 32.4*  INR 2.92 3.19  CREATININE 1.28* 1.01    Estimated Creatinine Clearance: 124 mL/min (by C-G formula based on SCr of 1.01 mg/dL).   Medical History: Past Medical History:  Diagnosis Date  . Alcohol abuse   . Arthritis    knees  . Asthma    uses Albuterol daily as needed  . Bipolar 1 disorder ([place])   . Bronchitis    last time 2 yrs ago  . Cellulitis   . Chronic dermatitis    due to Xarelto Rx  . Cough    smokers  . Depression   . DVT (deep venous thrombosis) (HCC)    on Coumadin Rx  . DVT (deep venous thrombosis) ([place])   . GERD (gastroesophageal reflux disease)    only takes something about 2 times a yr  . GERD (gastroesophageal reflux disease)   . Heart murmur   . Joint pain   . Pulmonary embolism ([place]) 2014  . Shortness of breath   . Substance abuse ([place])    crack, cocaine, last use 2007  . Tobacco abuse   . Tuberculosis    ' I test Positive "    Assessment: 52 yo man to continue coumadin for h/o DVT.  Admission INR therapeutic at 2.92, now slightly supratherapeutic, up to 3.19 after home dose 15mg  daily continued last night. Noted on azithromycin - may cause INR to bump. CBC stable. No bleed documented.  Goal of Therapy:  INR 2-3 Monitor platelets by anticoagulation protocol: Yes   Plan:  Coumadin 7.5mg  po today  at 1800 - lower dose tonight with DDI with azithro May need to further lower dose of coumadin if INR trend continues up Monitor daily INR, CBC, s/sx bleeding  Elicia Lamp, PharmD, BCPS Clinical Pharmacist 11/22/2017 11:29 AM

## 2017-11-22 NOTE — Progress Notes (Signed)
  Date: 11/22/2017  Patient name: [name] [name]  Medical record number: [id number]  Date of birth: [birth year]   I have seen and evaluated this patient and I have discussed the plan of care with the house staff. Please see their note for complete details. I concur with their findings with the following additions/corrections:   52 year old man admitted for COPD/asthma exacerbation.  He had failed outpatient treatment with steroids and breathing treatments.  He reports feeling slightly improved today, but still has significant tightness and wheezing on exam with restricted air movement and prolonged expiratory phase.  He continues to have significant dyspnea with any exertion.  He has high-pitched, hoarse voice has improved a little since admission, but is still not back to normal.  He requires at least one additional day of inpatient management with frequent breathing treatments and steroid therapy.  I am hopeful he will be able to go home tomorrow to complete a course of steroids.  Lenice Pressman, M.D., Ph.D. 11/22/2017, 3:03 PM

## 2017-11-22 NOTE — Progress Notes (Signed)
   Subjective:  Patient seen and examined. He states he feels a little improved from yesterday. Had difficulty sleeping because of his breathing. Still experiencing chest tightness, cough, and difficulty with expiration. Felt nauseated yesterday around lunch time, but this improved by dinner and he has been eating well. Denies vomiting or constipation, moving bowels regularly. Dicussed smoking cessation with the patient this morning. He smokes about 1/2 PPD. He has tried quitting in the past and has stopped smoking for as long as two months duration. He can't really think of any barriers that are preventing him from smoking. Declines nicotine patch at this time.   Objective:  Vital signs in last 24 hours: Vitals:   11/21/17 2353 11/22/17 0041 11/22/17 0340 11/22/17 0729  BP:  104/69  120/83  Pulse:  78 71 66  Resp:  20 20 18   Temp:  98.1 F (36.7 C)  97.8 F (36.6 C)  TempSrc:  Oral  Oral  SpO2: 97% 95% 98% 95%  Weight:      Height:       General: Sitting up in bed comfortably, NAD HEENT: [place]/AT, EOMI, no scleral icterus Cardiac: Tachycardic, regular rhythm, No R/M/G appreciated Pulm: normal effort, good aeration, diffuse expiratory wheezing throughout all lung fields Abd: soft, non tender, distended, BS normal Ext: extremities well perfused, +1 pitting edema RLE (chronic) compared to LLE with trace edema Neuro: alert and oriented X3, cranial nerves II-XII grossly intact   Assessment/Plan:  Principal Problem:   Moderate persistent asthma with (acute) exacerbation Active Problems:   Chronic deep vein thrombosis (DVT) of right lower extremity (HCC)   Tobacco use disorder   Chronic lumbar radiculopathy   Chronic venous insufficiency   History of pulmonary embolism  COPD Exacerbation Presenting with persistent SOB and wheezing after failure of outpatient management with oral steroids and home inhalers/nebulizer.  Patient is afebrile, saturating high 90s on RA. Lung exam with  diffuse expiratory wheezing throughout all lung field and prolonged expiratory phase. Subjective improvement today, lung exam unchanged from yesterday.  -Continue Azithromycin 250 mg daily  -Duonebs q4hrs -Albuterol nebulizer q4hr prn for SOB and wheezing  -Prednisone 40 mg daily  -Cont maintenance inhaler equivalent -Will start Singulair 10 mg qhs    H/o DVT Reports multiple DVTs in the past and is on long term anticoagulation with Warfarin. -Warfarin per pharm -Therapeutic INR  GERD -Continue Protonix 40 mg BID  Tobacco Use -Will discuss smoking cessation with patient prior to discharge    Dispo: Anticipated discharge in approximately 1-2 day(s).   Melanee Spry, MD 11/22/2017, 7:55 AM Pager: [phone]

## 2017-11-22 NOTE — Care Management Obs Status (Signed)
[facility] NOTIFICATION   Patient Details  Name: SAMAN UMSTEAD MRN: [id number] Date of Birth: Jan 30, 1966   Medicare Observation Status Notification Given:  Yes    Erenest Rasher, RN 11/22/2017, 2:01 PM

## 2017-11-23 DIAGNOSIS — Z881 Allergy status to other antibiotic agents status: Secondary | ICD-10-CM | POA: Diagnosis not present

## 2017-11-23 DIAGNOSIS — F17200 Nicotine dependence, unspecified, uncomplicated: Secondary | ICD-10-CM

## 2017-11-23 DIAGNOSIS — Z7901 Long term (current) use of anticoagulants: Secondary | ICD-10-CM | POA: Diagnosis not present

## 2017-11-23 DIAGNOSIS — Z888 Allergy status to other drugs, medicaments and biological substances status: Secondary | ICD-10-CM | POA: Diagnosis not present

## 2017-11-23 DIAGNOSIS — Z86718 Personal history of other venous thrombosis and embolism: Secondary | ICD-10-CM | POA: Diagnosis not present

## 2017-11-23 DIAGNOSIS — J441 Chronic obstructive pulmonary disease with (acute) exacerbation: Secondary | ICD-10-CM | POA: Diagnosis not present

## 2017-11-23 DIAGNOSIS — J383 Other diseases of vocal cords: Secondary | ICD-10-CM

## 2017-11-23 DIAGNOSIS — Z79899 Other long term (current) drug therapy: Secondary | ICD-10-CM | POA: Diagnosis not present

## 2017-11-23 LAB — CBC
HCT: 44.3 % (ref 39.0–52.0)
HEMOGLOBIN: 14.5 g/dL (ref 13.0–17.0)
MCH: 30.7 pg (ref 26.0–34.0)
MCHC: 32.7 g/dL (ref 30.0–36.0)
MCV: 93.7 fL (ref 78.0–100.0)
Platelets: 206 10*3/uL (ref 150–400)
RBC: 4.73 MIL/uL (ref 4.22–5.81)
RDW: 14.2 % (ref 11.5–15.5)
WBC: 14.6 10*3/uL — ABNORMAL HIGH (ref 4.0–10.5)

## 2017-11-23 LAB — PROTIME-INR
INR: 3.56
PROTHROMBIN TIME: 35.3 s — AB (ref 11.4–15.2)

## 2017-11-23 MED ORDER — PREDNISONE 20 MG PO TABS: ORAL_TABLET | ORAL | 0 refills | Status: AC

## 2017-11-23 MED ORDER — MONTELUKAST SODIUM 10 MG PO TABS: 10.0000 mg | ORAL_TABLET | Freq: Every day | ORAL | 0 refills | Status: DC

## 2017-11-23 MED ORDER — IPRATROPIUM-ALBUTEROL 0.5-2.5 (3) MG/3ML IN SOLN: 3.0000 mL | Freq: Four times a day (QID) | RESPIRATORY_TRACT | 3 refills | Status: DC | PRN

## 2017-11-23 MED ORDER — AZITHROMYCIN 250 MG PO TABS: 250.0000 mg | ORAL_TABLET | Freq: Every day | ORAL | 0 refills | Status: AC

## 2017-11-23 MED FILL — AZITHROMYCIN 250 MG TABLET: 250 | 2 days supply | Qty: 2 | Fill #0

## 2017-11-23 MED FILL — IPRAT-ALBUT 0.5-3(2.5) MG/3: 0.5-2.5 (3) | 30 days supply | Qty: 360 | Fill #0

## 2017-11-23 MED FILL — MONTELUKAST SOD 10 MG TAB: 10 | 30 days supply | Qty: 30 | Fill #0

## 2017-11-23 NOTE — Progress Notes (Signed)
Patient ambulated in hallway. O2 sats 95%. Patient denies any respiratory distress at this time.

## 2017-11-23 NOTE — Progress Notes (Signed)
[facility] for coumadin Indication: DVT  Allergies  Allergen Reactions  . Xarelto [Rivaroxaban] Dermatitis    Blisters skin  . Clindamycin/Lincomycin Dermatitis    Severe rash/dermatitis from November 2015 still present Jan 2016 from clinda    Patient Measurements: Height: 6\' 4"  (193 cm) Weight: 271 lb 2.7 oz (123 kg) IBW/kg (Calculated) : 86.8   Vital Signs: Temp: 97.5 F (36.4 C) (05/17 0852) Temp Source: Oral (05/17 0852) BP: 112/73 (05/17 1129) Pulse Rate: 84 (05/17 0852)  Labs: Recent Labs    11/21/17 0357 11/22/17 0256 11/23/17 0335  HGB 15.1 14.6 14.5  HCT 45.9 43.8 44.3  PLT 217 223 206  LABPROT 30.3* 32.4* 35.3*  INR 2.92 3.19 3.56  CREATININE 1.28* 1.01  --     Estimated Creatinine Clearance: 124 mL/min (by C-G formula based on SCr of 1.01 mg/dL).   Medical History: Past Medical History:  Diagnosis Date  . Alcohol abuse   . Arthritis    knees  . Asthma    uses Albuterol daily as needed  . Bipolar 1 disorder ([place])   . Bronchitis    last time 2 yrs ago  . Cellulitis   . Chronic dermatitis    due to Xarelto Rx  . Cough    smokers  . Depression   . DVT (deep venous thrombosis) (HCC)    on Coumadin Rx  . DVT (deep venous thrombosis) ([place])   . GERD (gastroesophageal reflux disease)    only takes something about 2 times a yr  . GERD (gastroesophageal reflux disease)   . Heart murmur   . Joint pain   . Pulmonary embolism ([place]) 2014  . Shortness of breath   . Substance abuse ([place])    crack, cocaine, last use 2007  . Tobacco abuse   . Tuberculosis    ' I test Positive "    Assessment: 52 yo man to continue coumadin for h/o DVT.  Admission INR therapeutic at 2.92, now supratherapeutic at 3.56 despite lower dose given last night - INR bump likely due to interaction with azithromycin - day #3/5. CBC stable. No bleed documented.  Goal of Therapy:  INR 2-3 Monitor platelets by anticoagulation protocol:  Yes   Plan:  Hold Coumadin tonight Monitor daily INR, CBC, s/sx bleeding, DDI  Elicia Lamp, PharmD, BCPS Clinical Pharmacist 11/23/2017 1:12 PM

## 2017-11-23 NOTE — Progress Notes (Signed)
   Subjective:  Patient seen and examined. No acute events overnight. He feels improved from yesterday. He is amenable to discharge later this afternoon. He has his maintenance inhalers and access to nebulizer treatments. Discussed smoking cessation again. Patient states he has everything he needs to help him quit. He already has a quit date set in his mind for in approximately two weeks. He has never been evaluated by ENT for vocal cord dysfunction. He states he would be interested in seeing an ENT as an outpatient.    Objective:  Vital signs in last 24 hours: Vitals:   11/23/17 0021 11/23/17 0031 11/23/17 0523 11/23/17 0744  BP:  117/72    Pulse:  76    Resp:      Temp: (P) 97.6 F (36.4 C)     TempSrc: (P) Oral     SpO2: 96% 93% 95% 98%  Weight:      Height:       General: Sitting up in bed comfortably, NAD HEENT: Felt/AT, EOMI, no scleral icterus Cardiac: RRR, No R/M/G appreciated Pulm: normal effort, good aeration, diffuse expiratory wheezing throughout all lung fields, prolonged expiratory phase; prominent upper airway wheeze Abd: soft, non tender, distended, BS normal Ext: extremities well perfused, +1 pitting edema RLE (chronic) compared to LLE with trace edema Neuro: alert and oriented X3, cranial nerves II-XII grossly intact   Assessment/Plan:  Principal Problem:   Moderate persistent asthma with (acute) exacerbation Active Problems:   Chronic deep vein thrombosis (DVT) of right lower extremity (HCC)   Tobacco use disorder   Chronic lumbar radiculopathy   Chronic venous insufficiency   History of pulmonary embolism  COPD/Asthma Exacerbation Presenting with persistent SOB and wheezing after failure of outpatient management with oral steroids and home inhalers/nebulizer.  Patient is afebrile, saturating high 90s on RA. Lung exam with diffuse expiratory wheezing throughout all lung field and prolonged expiratory phase. Subjective improvement today, lung exam unchanged  from yesterday.  -Ambulatory Pulse ox today -Continue Azithromycin 250 mg daily, will resume at discharge for total treatment duration of 5 days -Duonebs q4hrs -Albuterol nebulizer q4hr prn for SOB and wheezing  -Prednisone 40 mg daily, will resume at discharge and plan for taper -Cont maintenance inhaler equivalent -Continue Singulair 10 mg qhs, will prescribe at discharge  Vocal Cord Dysfunction  Patient's voice high pitched and raspy. Likely secondary to asthma/COPD exacerbation, but warrants outpatient evaluation by ENT.  -Outpatient ENT referral to be placed at hospital follow up visit    H/o DVT Reports multiple DVTs in the past and is on long term anticoagulation with Warfarin. -Warfarin per pharm -Therapeutic INR   GERD -Continue Protonix 40 mg BID  Tobacco Use Patient is motivated to quit and has set a quit in his mind for approximately 2 weeks. Will continue to provide guidance and support in our IM clinic to help patient achieve goal.    Dispo: Anticipated discharge today.   Melanee Spry, MD 11/23/2017, 8:20 AM Pager: [phone]

## 2017-11-23 NOTE — Progress Notes (Signed)
Patient given discharge instructions. Patient educated on medication and follow up appts.Patient verbalized understanding. Patient discharged home.

## 2017-11-23 NOTE — Care Management Note (Addendum)
Case Management Note  Patient Details  Name: [name] [name] MRN: [id number] Date of Birth: [birth year]  Subjective/Objective:     From home, pta indep, he states he does not have Medicare part D for medication coverage , but [facility] and he has been able to afford his medications.  He states he will have Medicare part D July 1 and they will reimburse him and he also has Medicaid but he just does not have the Card yet. He states he will be able to afford his medications 44.60 is price.                 Action/Plan: DC home when ready.   Expected Discharge Date:  11/23/17               Expected Discharge Plan:  Home/Self Care  In-House Referral:     Discharge planning Services  CM Consult  Post Acute Care Choice:    Choice offered to:     DME Arranged:    DME Agency:     HH Arranged:    HH Agency:     Status of Service:  Completed, signed off  If discussed at [employer] of Stay Meetings, dates discussed:    Additional Comments:  Zenon Mayo, RN 11/23/2017, 12:44 PM

## 2017-11-25 NOTE — Discharge Summary (Signed)
Name: [name] [name]: [id number] DOB: 11/25/1965 52 y.o. PCP: Thomasene Ripple, MD  Date of Admission: 11/21/2017  2:20 AM Date of Discharge: 11/23/2017 Attending Physician: No att. providers found  Discharge Diagnosis: 1. COPD/Asthma Exacerbation  Principal Problem:   COPD exacerbation (HCC) Active Problems:   Chronic deep vein thrombosis (DVT) of right lower extremity (HCC)   Tobacco use disorder   Chronic lumbar radiculopathy   Chronic venous insufficiency   Moderate persistent asthma with (acute) exacerbation   History of pulmonary embolism   Discharge Medications: Allergies as of 11/23/2017      Reactions   Xarelto [rivaroxaban] Dermatitis   Blisters skin   Clindamycin/lincomycin Dermatitis   Severe rash/dermatitis from November 2015 still present Jan 2016 from [facility]      Medication List    TAKE these medications   acetaminophen 325 MG tablet Commonly known as:  TYLENOL Take 2 tablets (650 mg total) by mouth every 6 (six) hours as needed. What changed:  reasons to take this   albuterol 108 (90 Base) MCG/ACT inhaler Commonly known as:  PROVENTIL HFA;VENTOLIN HFA Inhale 2 puffs into the lungs every 6 (six) hours as needed for wheezing or shortness of breath.   albuterol (2.5 MG/3ML) 0.083% nebulizer solution Commonly known as:  PROVENTIL Take 3 mLs (2.5 mg total) by nebulization every 6 (six) hours as needed for wheezing or shortness of breath.   azithromycin 250 MG tablet Commonly known as:  ZITHROMAX Take 1 tablet (250 mg total) by mouth daily for 2 days.   cetirizine 10 MG tablet Commonly known as:  ZYRTEC ALLERGY Take 1 tablet (10 mg total) by mouth daily.   diclofenac sodium 1 % Gel Commonly known as:  VOLTAREN Apply 2 g topically 4 (four) times daily. What changed:    when to take this  reasons to take this   DULoxetine 60 MG capsule Commonly known as:  CYMBALTA Take 1 capsule (60 mg total) by mouth daily. IM program   furosemide 40 MG  tablet Commonly known as:  LASIX Take 1 tablet (40 mg total) by mouth daily.   gabapentin 300 MG capsule Commonly known as:  NEURONTIN Take 3 capsules (900 mg total) by mouth 3 (three) times daily.   hydrocerin Crea Apply 1 application topically daily.   hydrocortisone cream 1 % Apply 1 application topically 2 (two) times daily. What changed:    when to take this  reasons to take this   ibuprofen 800 MG tablet Commonly known as:  ADVIL,MOTRIN Take 1 tablet (800 mg total) by mouth every 8 (eight) hours as needed. What changed:  reasons to take this   ipratropium-albuterol 0.5-2.5 (3) MG/3ML Soln Commonly known as:  DUONEB Take 3 mLs by nebulization every 6 (six) hours as needed.   mometasone-formoterol 200-5 MCG/ACT Aero Commonly known as:  DULERA Inhale 2 puffs into the lungs 2 (two) times daily.   montelukast 10 MG tablet Commonly known as:  SINGULAIR Take 1 tablet (10 mg total) by mouth at bedtime.   nicotine 10 MG inhaler Commonly known as:  NICOTROL Inhale 1 cartridge (puff for 20 minutes) as needed. Use at least 6 cartridges/day the first 3-6 weeks, reduce gradually over 12 weeks What changed:    how much to take  how to take this  when to take this  reasons to take this  additional instructions   OVER THE COUNTER MEDICATION Apply 1 application topically daily. oxyphor   oxyCODONE-acetaminophen 7.5-325 MG tablet Commonly known  as:  PERCOCET Take 1 tablet by mouth every 4 (four) hours as needed for severe pain.   pantoprazole 40 MG tablet Commonly known as:  PROTONIX Take 1 tablet (40 mg total) by mouth 2 (two) times daily.   predniSONE 20 MG tablet Commonly known as:  DELTASONE Take 2 tablets (40 mg total) by mouth daily with breakfast for 2 days, THEN 1 tablet (20 mg total) daily with breakfast for 3 days, THEN 0.5 tablets (10 mg total) daily with breakfast for 3 days. Start taking on:  11/24/2017 What changed:    medication strength  See the  new instructions.   traZODone 100 MG tablet Commonly known as:  DESYREL Take 1 tablet (100 mg total) by mouth at bedtime as needed for sleep. What changed:  when to take this   warfarin 5 MG tablet Commonly known as:  COUMADIN Take as directed. If you are unsure how to take this medication, talk to your nurse or doctor. Original instructions:  Take 3 tablets by mouth, once-daily at 6PM. IM Program.       Disposition and follow-up:   Mr.Godwin A Ging was discharged from [facility] in Good condition.  At the hospital follow up visit please address:  1.  COPD/Asthma Exacerbation  -Patient was discharged with a Prednisone taper: Take 2 tablets (40 mg total) by mouth daily with breakfast for 2 days, THEN 1 tablet (20 mg total) daily with breakfast for 3 days, THEN 0.5 tablets (10 mg total) daily with breakfast for 3 days. -Please ensure patient is tapering as prescribed -Patient was also discharged with 2 day prescription of azithromycin - please confirm patient has completed his antibiotic course  -Please assess for resolution of symptoms -Started patient on Singulair 10 mg qhs - please make sure patient was able to pick up this medicine and has started taking it   2. Vocal Cord Dysfunction  Patient's voice high pitched and raspy. Likely secondary to asthma/COPD exacerbation, but warrants outpatient evaluation by ENT.  -Please help facilitate outpatient ENT referral    3. Tobacco Use, current every day smoker -Patient is motivated to quit and has set a quit in his mind for approximately 2 weeks.  -Please assess patient's tobacco use and help facilitate cessation    2.  Labs / imaging needed at time of follow-up: none  3.  Pending labs/ test needing follow-up: none   Follow-up Appointments:   Hospital Course by problem list: Principal Problem:   COPD exacerbation ([place]) Active Problems:   Chronic deep vein thrombosis (DVT) of right lower extremity (HCC)    Tobacco use disorder   Chronic lumbar radiculopathy   Chronic venous insufficiency   Moderate persistent asthma with (acute) exacerbation   History of pulmonary embolism   1.  COPD/Asthma Exacerbation  Mr. Gladish presented to [facility] emergency department with shortness of breath and wheezing not relieved by home nebulizer treatments, inhalers, or steroid taper that was prescribed at outpatient IM clinic.  Upon arrival, his vital signs were stable and he had no new oxygen requirement. Chest xray was negative for focal opacity, pleural effusions, or pneumothorax. He was started on IV Solu-Medrol, scheduled duo nebs, and albuterol nebulizer as needed.  Azithromycin was added to his treatment regimen. He improved after the initiation of antibiotics.  He was transitioned to 40 mg of PO prednisone, and discharged with a prolonged taper. Ambulatory pulse oximetry at discharge was normal and without exertional hypoxia.   2. History of  DVT Patient was therapeutic on Warfarin on admission. On respiratory exam, he had decreased aeration and diffuse expiratory wheezing, making PE an unlikely cause of the patient's dyspnea. Warfarin dosing and monitoring was managed by pharmacy and his INR was therapeutic throughout the course of the hospitalization.    Discharge Vitals:   BP 112/73 Comment: c/o feeling dizzy  Pulse 84   Temp (!) 97.5 F (36.4 C) (Oral)   Resp 18   Ht 6\' 4"  (1.93 m)   Wt 271 lb 2.7 oz (123 kg)   SpO2 97%   BMI 33.01 kg/m   Pertinent Labs, Studies, and Procedures:  CBC Latest Ref Rng & Units 11/23/2017 11/22/2017 11/21/2017  WBC 4.0 - 10.5 K/uL 14.6(H) 19.4(H) 10.3  Hemoglobin 13.0 - 17.0 g/dL 14.5 14.6 15.1  Hematocrit 39.0 - 52.0 % 44.3 43.8 45.9  Platelets 150 - 400 K/uL 206 223 217   BMP Latest Ref Rng & Units 11/22/2017 11/21/2017 11/19/2017  Glucose 65 - 99 mg/dL 152(H) 158(H) 135(H)  BUN 6 - 20 mg/dL 16 16 9   Creatinine 0.61 - 1.24 mg/dL 1.01 1.28(H) 0.99  Sodium 135 - 145  mmol/L 140 139 140  Potassium 3.5 - 5.1 mmol/L 4.1 4.4 3.9  Chloride 101 - 111 mmol/L 103 104 108  CO2 22 - 32 mmol/L 28 24 24   Calcium 8.9 - 10.3 mg/dL 9.0 9.2 8.9    Discharge Instructions: Discharge Instructions    Call MD for:  difficulty breathing, headache or visual disturbances   Complete by:  As directed    Call MD for:  temperature >100.4   Complete by:  As directed    Diet - low sodium heart healthy   Complete by:  As directed    Discharge instructions   Complete by:  As directed    Mr. Diontay, Rosencrans were admitted for an asthma/COPD exacerbation. I have prescribed you several medications to be taken once you leave the hospital.   Prednisone taper: Take 40 mg of prednisone for an additional 2 days. Then take 20 mg of prednisone for an additional 3 days. Then take 10 mg of prednisone for an additional 3 days. Then STOP.   Antibiotic: Take Azithromycin 250 mg daily for an additional 2 days. Then stop.   I have also started you on a new medication that may help control your asthma better. This is called Singulair. Take 10 mg of Singulair every night at bedtime.   Continue to do breathing treatment every 4-6 hours as needed. I prescribed you a different type of nebulaizer treatment called a Duoneb. I recommend continuing Duoneb nebulizer treatment every 4-6 hours(when you are awake), for atleast another 1-2 days or until you symptomatically feel improved.   Continue all other maintenance inhaler medications as previously prescribed.   [facility] for hospital follow up on Wedneday, May 22 @ 10:15AM.   Increase activity slowly   Complete by:  As directed       Signed: Melanee Spry, MD 11/25/2017, 10:31 AM   Pager: [phone]

## 2017-11-26 ENCOUNTER — Other Ambulatory Visit: Payer: Self-pay

## 2017-11-26 ENCOUNTER — Ambulatory Visit (INDEPENDENT_AMBULATORY_CARE_PROVIDER_SITE_OTHER): Payer: Medicare Other | Admitting: Internal Medicine

## 2017-11-26 VITALS — BP 141/89 | HR 109 | Temp 98.0°F | Ht 76.0 in | Wt 277.1 lb

## 2017-11-26 DIAGNOSIS — R0602 Shortness of breath: Secondary | ICD-10-CM | POA: Diagnosis not present

## 2017-11-26 DIAGNOSIS — Z7952 Long term (current) use of systemic steroids: Secondary | ICD-10-CM

## 2017-11-26 DIAGNOSIS — B370 Candidal stomatitis: Secondary | ICD-10-CM | POA: Diagnosis not present

## 2017-11-26 DIAGNOSIS — S29011A Strain of muscle and tendon of front wall of thorax, initial encounter: Secondary | ICD-10-CM

## 2017-11-26 DIAGNOSIS — F17210 Nicotine dependence, cigarettes, uncomplicated: Secondary | ICD-10-CM | POA: Diagnosis not present

## 2017-11-26 DIAGNOSIS — R0689 Other abnormalities of breathing: Secondary | ICD-10-CM

## 2017-11-26 DIAGNOSIS — R0789 Other chest pain: Secondary | ICD-10-CM | POA: Insufficient documentation

## 2017-11-26 DIAGNOSIS — J4540 Moderate persistent asthma, uncomplicated: Secondary | ICD-10-CM | POA: Diagnosis not present

## 2017-11-26 DIAGNOSIS — X58XXXA Exposure to other specified factors, initial encounter: Secondary | ICD-10-CM

## 2017-11-26 DIAGNOSIS — R499 Unspecified voice and resonance disorder: Secondary | ICD-10-CM | POA: Diagnosis not present

## 2017-11-26 DIAGNOSIS — J4541 Moderate persistent asthma with (acute) exacerbation: Secondary | ICD-10-CM

## 2017-11-26 DIAGNOSIS — R05 Cough: Secondary | ICD-10-CM

## 2017-11-26 DIAGNOSIS — J029 Acute pharyngitis, unspecified: Secondary | ICD-10-CM | POA: Diagnosis not present

## 2017-11-26 MED ORDER — FLUCONAZOLE 100 MG PO TABS: 100.0000 mg | ORAL_TABLET | Freq: Every day | ORAL | 0 refills | Status: DC

## 2017-11-26 MED ORDER — OXYCODONE-ACETAMINOPHEN 7.5-325 MG PO TABS: 1.0000 | ORAL_TABLET | Freq: Four times a day (QID) | ORAL | 0 refills | Status: DC | PRN

## 2017-11-26 MED ORDER — DICLOFENAC SODIUM 1 % TD GEL: 2.0000 g | Freq: Four times a day (QID) | TRANSDERMAL | 0 refills | Status: AC | PRN

## 2017-11-26 NOTE — Assessment & Plan Note (Signed)
Mr. Heap has fairly severe chest wall pain that seems to be from his multiple weeks of prolonged coughing.  This is consistent with a musculoskeletal strain as it is also triggered by positional change in overhead reaching which applies tension to the area. He has respiratory splinting due to the pain which is concerning for worsening of his respiratory status again. Plan: Recommend starting Voltaren gel to the affected area up to 4 times daily Prescribed 5 days of 7.5 mg oxycodone every 6 hours as needed for chest wall pain

## 2017-11-26 NOTE — Assessment & Plan Note (Signed)
His work of breathing and diffuse wheezes seem better today with fair air movement in all lung fields.  His loud high-pitched upper airway expiratory wheeze is now more concerning for a laryngeal obstruction or vocal cord dysfunction. Plan: Referral to ENT to evaluate possible vocal cord dysfunction Finish current course of oral prednisone

## 2017-11-26 NOTE — Patient Instructions (Signed)
I recommend using topical voltaren gel on the affected area of your side for an antiinflammatory medicine. I have also prescribed a short duration of oral oxycodone-acetaminophen 7.5mg  every 6 hours as needed that may help with this.  We will refer you to be seen by a throat doctor to better manage your wheezing and voice change.  You are developing some yeast infection in your throat related to the steroid treatments for asthma. Take fluconazole 200mg  x 1 day then 100mg  daily x 1 week.

## 2017-11-26 NOTE — Progress Notes (Signed)
CC: Cough and chest wall pain  HPI:  [name] is a 52 y.o. male with PMHx detailed below presenting for hospital follow-up after discharge 3 days ago.  He is finished taking his azithromycin but is still finishing the prednisone.  He feels his difficulty breathing and has improved but is still having expiratory wheeze and is having worsening right-sided chest wall pain provoked by coughing.  This pain is bothersome enough that he is taking shallow breaths to try and avoid provoking repeated coughs.  He also cannot reach up above shoulder height with his right arm without some pain.  See problem based assessment and plan below for additional details.  Chest wall muscle strain [name] has fairly severe chest wall pain that seems to be from his multiple weeks of prolonged coughing.  This is consistent with a musculoskeletal strain as it is also triggered by positional change in overhead reaching which applies tension to the area. He has respiratory splinting due to the pain which is concerning for worsening of his respiratory status again. Plan: Recommend starting Voltaren gel to the affected area up to 4 times daily Prescribed 5 days of 7.5 mg oxycodone every 6 hours as needed for chest wall pain  Oral thrush New small areas of thrush in the posterior oropharynx due to his ongoing use of inhaled and high-dose oral corticosteroids. Plan: Start fluconazole 200 mg x 1 then 100 mg daily for 1 week  Moderate persistent asthma His work of breathing and diffuse wheezes seem better today with fair air movement in all lung fields.  His loud high-pitched upper airway expiratory wheeze is now more concerning for a laryngeal obstruction or vocal cord dysfunction. Plan: Referral to ENT to evaluate possible vocal cord dysfunction Finish current course of oral prednisone   Past Medical History:  Diagnosis Date  . Alcohol abuse   . Arthritis    knees  . Asthma    uses Albuterol daily as  needed  . Bipolar 1 disorder ([place])   . Bronchitis    last time 2 yrs ago  . Cellulitis   . Chronic dermatitis    due to Xarelto Rx  . Cough    smokers  . Depression   . DVT (deep venous thrombosis) (HCC)    on Coumadin Rx  . DVT (deep venous thrombosis) ([place])   . GERD (gastroesophageal reflux disease)    only takes something about 2 times a yr  . GERD (gastroesophageal reflux disease)   . Heart murmur   . Joint pain   . Pulmonary embolism ([place]) 2014  . Shortness of breath   . Substance abuse ([place])    crack, cocaine, last use 2007  . Tobacco abuse   . Tuberculosis    ' I test Positive "    Review of Systems: Review of Systems  HENT: Positive for sore throat.   Eyes: Negative for blurred vision.  Respiratory: Positive for cough, shortness of breath and wheezing. Negative for sputum production.   Cardiovascular: Positive for chest pain and leg swelling.  Gastrointestinal: Negative for abdominal pain.  Musculoskeletal: Negative for falls.     Physical Exam: Vitals:   11/26/17 1435  BP: (!) 141/89  Pulse: (!) 109  Temp: 98 F (36.7 C)  TempSrc: Oral  SpO2: 94%  Weight: 277 lb 1.6 oz (125.7 kg)  Height: 6\' 4"  (1.93 m)   GENERAL- alert, co-operative, NAD HEENT-few small white plaques in posterior oropharynx, loud high-pitched monophonic expiratory wheeze  in upper airway CARDIAC- RRR, no murmurs, rubs or gallops. RESP-fair air movement throughout lung fields without inspiratory wheezing, radiation of loud upper airway wheeze on expiration through all fields CHEST WALL-severe tenderness to palpation along the right lower costal border extending from the midclavicular line all the way to the right flank EXTREMITIES-1+ pitting edema below the knees bilaterally   Assessment & Plan:   See encounters tab for problem based medical decision making.   Patient discussed with Dr. Dareen Piano

## 2017-11-26 NOTE — Assessment & Plan Note (Signed)
New small areas of thrush in the posterior oropharynx due to his ongoing use of inhaled and high-dose oral corticosteroids. Plan: Start fluconazole 200 mg x 1 then 100 mg daily for 1 week

## 2017-11-27 NOTE — Progress Notes (Signed)
Internal Medicine Clinic Attending  Case discussed with Dr. Rice at the time of the visit.  We reviewed the resident's history and exam and pertinent patient test results.  I agree with the assessment, diagnosis, and plan of care documented in the resident's note.  

## 2017-11-28 ENCOUNTER — Ambulatory Visit: Payer: Self-pay

## 2017-12-10 ENCOUNTER — Encounter: Payer: Self-pay | Admitting: Internal Medicine

## 2017-12-10 ENCOUNTER — Ambulatory Visit: Payer: Self-pay

## 2017-12-19 ENCOUNTER — Ambulatory Visit (INDEPENDENT_AMBULATORY_CARE_PROVIDER_SITE_OTHER): Payer: Medicare Other | Admitting: Internal Medicine

## 2017-12-19 VITALS — BP 132/90 | HR 84 | Temp 97.8°F | Wt 278.0 lb

## 2017-12-19 DIAGNOSIS — R0609 Other forms of dyspnea: Secondary | ICD-10-CM

## 2017-12-19 DIAGNOSIS — I82511 Chronic embolism and thrombosis of right femoral vein: Secondary | ICD-10-CM

## 2017-12-19 DIAGNOSIS — J4540 Moderate persistent asthma, uncomplicated: Secondary | ICD-10-CM | POA: Diagnosis not present

## 2017-12-19 DIAGNOSIS — I82401 Acute embolism and thrombosis of unspecified deep veins of right lower extremity: Secondary | ICD-10-CM | POA: Diagnosis present

## 2017-12-19 DIAGNOSIS — M5416 Radiculopathy, lumbar region: Secondary | ICD-10-CM

## 2017-12-19 DIAGNOSIS — G4730 Sleep apnea, unspecified: Secondary | ICD-10-CM | POA: Diagnosis not present

## 2017-12-19 LAB — POCT INR: INR: 3.6 — AB (ref 2.0–3.0)

## 2017-12-19 MED ORDER — DULOXETINE HCL 60 MG PO CPEP: 60.0000 mg | ORAL_CAPSULE | Freq: Every day | ORAL | 3 refills | Status: DC

## 2017-12-19 MED ORDER — NAPROXEN 500 MG PO TABS: 500.0000 mg | ORAL_TABLET | Freq: Two times a day (BID) | ORAL | 0 refills | Status: DC

## 2017-12-19 MED FILL — DULoxetine HCL 60 MG CPEP: 60 | 30 days supply | Qty: 30 | Fill #0

## 2017-12-19 MED FILL — WARFARIN SODIUM 5 MG TABLET: 5 | 28 days supply | Qty: 84 | Fill #2

## 2017-12-19 NOTE — Progress Notes (Signed)
   CC: asthma  HPI:  [name] [name] is a 52 y.o. male with a past medical history listed below here today for follow up of his moderate persistent asthma.   For details of today's visit and the status of his chronic medical issues please refer to the assessment and plan.  Past Medical History:  Diagnosis Date  . Alcohol abuse   . Arthritis    knees  . Asthma    uses Albuterol daily as needed  . Bipolar 1 disorder ([place])   . Bronchitis    last time 2 yrs ago  . Cellulitis   . Chronic dermatitis    due to Xarelto Rx  . Cough    smokers  . Depression   . DVT (deep venous thrombosis) (HCC)    on Coumadin Rx  . DVT (deep venous thrombosis) ([place])   . GERD (gastroesophageal reflux disease)    only takes something about 2 times a yr  . GERD (gastroesophageal reflux disease)   . Heart murmur   . Joint pain   . Pulmonary embolism ([place]) 2014  . Shortness of breath   . Substance abuse ([place])    crack, cocaine, last use 2007  . Tobacco abuse   . Tuberculosis    ' I test Positive "   Review of Systems:   Review of Systems  Constitutional: Positive for malaise/fatigue. Negative for chills and fever.  Respiratory: Positive for shortness of breath and wheezing.   Cardiovascular: Positive for leg swelling. Negative for chest pain and palpitations.  Musculoskeletal: Positive for back pain. Negative for falls.  Neurological: Negative for focal weakness.  Psychiatric/Behavioral:       Mood has been labile      Physical Exam:  Vitals:   12/19/17 1533  BP: 132/90  Pulse: 84  Temp: 97.8 F (36.6 C)  TempSrc: Oral  SpO2: 100%  Weight: 278 lb (126.1 kg)   Physical Exam  Constitutional: He is oriented to person, place, and time. He appears well-developed and well-nourished. No distress.  HENT:  Head: Normocephalic and atraumatic.  Eyes: Conjunctivae and EOM are normal.  Cardiovascular: Normal rate, regular rhythm and normal heart sounds.  Pulmonary/Chest: Effort normal and  breath sounds normal. He has no wheezes.  Upper airway expiratory wheeze  Musculoskeletal: He exhibits no edema.       Lumbar back: He exhibits tenderness. He exhibits no bony tenderness and no deformity.  Neurological: He is alert and oriented to person, place, and time. He has normal strength. No cranial nerve deficit or sensory deficit.  Skin: Skin is warm and dry.    Assessment & Plan:   See Encounters Tab for problem based charting.  Patient discussed with Dr. Rebeca Alert

## 2017-12-19 NOTE — Assessment & Plan Note (Addendum)
INR 3.6 today. Goal INR 2-3.   Plan: -Patient Instructed to hold Coumadin dose tonight  -Restart 15 mg Coumadin daily starting tomorrow -RTC 1 week for INR check

## 2017-12-19 NOTE — Assessment & Plan Note (Signed)
Patient is having acute worsening of his chronic lumbar radiculopathy pain for about 3 weeks duration.  He states he is having difficulty standing for long periods of time.  He is only able to stand for several minutes and then has to sit down due to the sensation of his legs feeling heavy and pain shooting down the back of his legs.  He has been off his Cymbalta for several weeks, which I believe is likely contributing to the acute worsening of his pain.  Plan: -Refilled Cymbalta 60 mg daily  -Continue Gabapentin 900 mg TID

## 2017-12-19 NOTE — Patient Instructions (Addendum)
[name] [name],   It was nice seeing you today.   I have refilled your medications and sent them to your pharmacy.   I would like you to get a sleep and study and echocardiogram of your heart within the next few months.

## 2017-12-20 ENCOUNTER — Encounter: Payer: Self-pay | Admitting: Internal Medicine

## 2017-12-20 DIAGNOSIS — G4730 Sleep apnea, unspecified: Secondary | ICD-10-CM | POA: Insufficient documentation

## 2017-12-20 NOTE — Assessment & Plan Note (Addendum)
Patient has now been off the steroid taper for 2 weeks duration. He states his asthma has been under fairly good control. He had one episode of SOB and wheezing after working outdoors, which required him to use his his rescue inhaler and do a breathing treatment. He states it took him about 15 minutes to recover. He was started on Singulair at his most recent hospitalization for an asthma exacerbation. He states since starting the singulair his asthma symptoms have been under much better control. He continues to have shortness of breath with exertion. Unclear if this is secondary to his asthma. He states he is unable to walk long distances due to chronic lumbar radicular pain and this may be contributing to him feeling short of breath. He also states that he received a call from the clinic regarding a change in his inhaler regimen. He is currently on Dulera. No phone encounters in chart regarding this. He is planning to follow up with ENT on 6/19. On exam, lungs CTAB with some expiratory upper airway wheezing.   Plan: -Continue Singulair -Continue Dulera - will get in touch with Dr. Maudie Mercury to see if she is aware of the telephone call regarding change in inhaler -Albuterol PRN -Patient to see ENT soon  -Given his persistent SOB with exertion will get echocardiogram to rule out cardiac etiology of SOB

## 2017-12-20 NOTE — Assessment & Plan Note (Signed)
Patient complaining of paroxsymal nocturnal dyspnea. He states he has had about 5 episodes of waking up gasping for air within the last 30 days. Patient is high risk for OSA with STOP BANG screening.   Plan: -Order placed for split night study

## 2017-12-21 ENCOUNTER — Ambulatory Visit: Payer: Self-pay | Admitting: Pharmacist

## 2017-12-21 ENCOUNTER — Ambulatory Visit: Payer: Self-pay

## 2017-12-26 ENCOUNTER — Encounter: Payer: Self-pay | Admitting: *Deleted

## 2017-12-26 ENCOUNTER — Other Ambulatory Visit: Payer: Self-pay | Admitting: Internal Medicine

## 2017-12-26 ENCOUNTER — Ambulatory Visit (INDEPENDENT_AMBULATORY_CARE_PROVIDER_SITE_OTHER): Payer: Medicare Other | Admitting: Pharmacist

## 2017-12-26 DIAGNOSIS — J45909 Unspecified asthma, uncomplicated: Secondary | ICD-10-CM | POA: Diagnosis not present

## 2017-12-26 DIAGNOSIS — I82501 Chronic embolism and thrombosis of unspecified deep veins of right lower extremity: Secondary | ICD-10-CM | POA: Diagnosis not present

## 2017-12-26 DIAGNOSIS — F17200 Nicotine dependence, unspecified, uncomplicated: Secondary | ICD-10-CM | POA: Diagnosis not present

## 2017-12-26 DIAGNOSIS — J449 Chronic obstructive pulmonary disease, unspecified: Secondary | ICD-10-CM

## 2017-12-26 DIAGNOSIS — J4540 Moderate persistent asthma, uncomplicated: Secondary | ICD-10-CM

## 2017-12-26 DIAGNOSIS — R490 Dysphonia: Secondary | ICD-10-CM | POA: Diagnosis not present

## 2017-12-26 LAB — POCT INR: INR: 4 — AB (ref 2.0–3.0)

## 2017-12-26 MED ORDER — MONTELUKAST SODIUM 10 MG PO TABS: 10.0000 mg | ORAL_TABLET | Freq: Every day | ORAL | 0 refills | Status: DC

## 2017-12-26 MED ORDER — TIOTROPIUM BROMIDE MONOHYDRATE 18 MCG IN CAPS: 18.0000 ug | ORAL_CAPSULE | Freq: Every day | RESPIRATORY_TRACT | 2 refills | Status: DC

## 2017-12-26 MED ORDER — FLUTICASONE-UMECLIDIN-VILANT 100-62.5-25 MCG/INH IN AEPB: 1.0000 | INHALATION_SPRAY | Freq: Every day | RESPIRATORY_TRACT | 11 refills | Status: DC

## 2017-12-26 NOTE — Progress Notes (Signed)
Reviewed Agree w plan Thanks DrG

## 2017-12-26 NOTE — Patient Instructions (Signed)
Patient educated about medication as defined in this encounter and verbalized understanding by repeating back instructions provided.   

## 2017-12-26 NOTE — Progress Notes (Signed)
Anticoagulation Management [name] A Clappis a 52 y.o.malewho reports to the clinic for monitoring of warfarintreatment.   Indication:DVT Duration:indefinite Supervising physician:[name] Visit History: Patientdoes notreport signs/symptoms of bleeding or thromboembolism  Other recent changes:Denies anydiet, medications, lifestylechanges  Anticoagulation Episode Summary    Current INR goal:   2.0-3.0  TTR:   55.4 % (2.7 y)  Next INR check:   01/02/2018  INR from last check:   4.0! (12/26/2017)  Weekly max warfarin dose:     Target end date:     INR check location:     Preferred lab:     Send INR reminders to:   ANTICOAG IMP   Indications   Chronic deep vein thrombosis (DVT) of right lower extremity ([place]) [I82.501] Long-term (current) use of anticoagulants [Z79.01] DVT (Resolved) [I82.409]       Comments:         Anticoagulation Care Providers    Provider Role Specialty Phone number   Bartholomew Crews, MD  Internal Medicine [phone]     Allergies  Allergen Reactions  . Xarelto [Rivaroxaban] Dermatitis    Blisters skin  . Clindamycin/Lincomycin Dermatitis    Severe rash/dermatitis from November 2015 still present Jan 2016 from clinda   Medication Sig  acetaminophen (TYLENOL) 325 MG tablet Take 2 tablets (650 mg total) by mouth every 6 (six) hours as needed. Patient taking differently: Take 650 mg by mouth every 6 (six) hours as needed for mild pain.   albuterol (PROVENTIL HFA;VENTOLIN HFA) 108 (90 Base) MCG/ACT inhaler Inhale 2 puffs into the lungs every 6 (six) hours as needed for wheezing or shortness of breath.  albuterol (PROVENTIL) (2.5 MG/3ML) 0.083% nebulizer solution Take 3 mLs (2.5 mg total) by nebulization every 6 (six) hours as needed for wheezing or shortness of breath.  cetirizine (ZYRTEC ALLERGY) 10 MG tablet Take 1 tablet (10 mg total) by mouth daily.  diclofenac sodium (VOLTAREN) 1 % GEL Apply 2 g  topically 4 (four) times daily as needed (pain).  DULoxetine (CYMBALTA) 60 MG capsule Take 1 capsule (60 mg total) by mouth daily.  fluconazole (DIFLUCAN) 100 MG tablet Take 1 tablet (100 mg total) by mouth daily. Take 2 on the first day  Fluticasone-Umeclidin-Vilant (TRELEGY ELLIPTA) 100-62.5-25 MCG/INH AEPB Inhale 1 puff into the lungs daily.  furosemide (LASIX) 40 MG tablet Take 1 tablet (40 mg total) by mouth daily.  gabapentin (NEURONTIN) 300 MG capsule Take 3 capsules (900 mg total) by mouth 3 (three) times daily.  hydrocerin (EUCERIN) CREA Apply 1 application topically daily.  hydrocortisone cream 1 % Apply 1 application topically 2 (two) times daily. Patient taking differently: Apply 1 application topically 2 (two) times daily as needed for itching.   ibuprofen (ADVIL,MOTRIN) 800 MG tablet Take 1 tablet (800 mg total) by mouth every 8 (eight) hours as needed. Patient taking differently: Take 800 mg by mouth every 8 (eight) hours as needed for moderate pain.   ipratropium-albuterol (DUONEB) 0.5-2.5 (3) MG/3ML SOLN Take 3 mLs by nebulization every 6 (six) hours as needed.  mometasone-formoterol (DULERA) 200-5 MCG/ACT AERO Inhale 2 puffs into the lungs 2 (two) times daily.  montelukast (SINGULAIR) 10 MG tablet Take 1 tablet (10 mg total) by mouth at bedtime.  nicotine (NICOTROL) 10 MG inhaler Inhale 1 cartridge (puff for 20 minutes) as needed. Use at least 6 cartridges/day the first 3-6 weeks, reduce gradually over 12 weeks Patient taking differently: Inhale 1 continuous puffing into the lungs daily as needed for smoking cessation.  OVER THE COUNTER MEDICATION Apply 1 application topically daily. oxyphor  oxyCODONE-acetaminophen (PERCOCET) 7.5-325 MG tablet Take 1 tablet by mouth every 6 (six) hours as needed for severe pain.  pantoprazole (PROTONIX) 40 MG tablet Take 1 tablet (40 mg total) by mouth 2 (two) times daily.  traZODone (DESYREL) 100 MG tablet Take 1 tablet (100 mg total) by mouth  at bedtime as needed for sleep. Patient taking differently: Take 100 mg by mouth at bedtime.   warfarin (COUMADIN) 5 MG tablet Take 3 tablets by mouth, once-daily at 6PM. IM Program.   Past Medical History:  Diagnosis Date  . Alcohol abuse   . Arthritis    knees  . Asthma    uses Albuterol daily as needed  . Bipolar 1 disorder ([place])   . Bronchitis    last time 2 yrs ago  . Cellulitis   . Chronic dermatitis    due to Xarelto Rx  . Cough    smokers  . Depression   . DVT (deep venous thrombosis) ([facility])    on Coumadin Rx  . DVT (deep venous thrombosis) ([place])   . GERD (gastroesophageal reflux disease)    only takes something about 2 times a yr  . GERD (gastroesophageal reflux disease)   . Heart murmur   . Joint pain   . Pulmonary embolism ([place]) 2014  . Shortness of breath   . Substance abuse ([place])    crack, cocaine, last use 2007  . Tobacco abuse   . Tuberculosis    ' I test Positive "   Social History   Socioeconomic History  . Marital status: Single    Spouse name: Not on file  . Number of children: Not on file  . Years of education: Not on file  . Highest education level: Not on file  Occupational History  . Occupation: Dentist: UNEMPLOYED  Social Needs  . Financial resource strain: Not on file  . Food insecurity:    Worry: Not on file    Inability: Not on file  . Transportation needs:    Medical: Not on file    Non-medical: Not on file  Tobacco Use  . Smoking status: Current Every Day Smoker    Packs/day: 0.50    Types: Cigarettes  . Smokeless tobacco: Former Systems developer  . Tobacco comment: 11/21/17: "0.5 PPD since age 86"  Substance and Sexual Activity  . Alcohol use: No  . Drug use: No    Types: Cocaine    Comment: last use 2016  . Sexual activity: Yes    Partners: Female  Lifestyle  . Physical activity:    Days per week: Not on file    Minutes per session: Not on file  . Stress: Not on file  Relationships  . Social connections:     Talks on phone: Not on file    Gets together: Not on file    Attends religious service: Not on file    Active member of club or organization: Not on file    Attends meetings of clubs or organizations: Not on file    Relationship status: Not on file  Other Topics Concern  . Not on file  Social History Narrative   Working at [employer] now unemployed.  States he has insurance through them now.    Lives with mom and step dad          Family History  Problem Relation Age of Onset  . Asthma  Mother   . Throat cancer Father    ASSESSMENT Recent Results: The most recent result is correlated with 105 mg per week: Lab Results  Component Value Date   INR 4.0 (A) 12/26/2017   INR 3.6 (A) 12/19/2017   INR 3.56 11/23/2017   Anticoagulation Dosing: Description   Take 3 of your 5mg  peach-colored warfarin tablets by mouth, once-daily, at [facility] each day.      INR today: Supratherapeutic  PLAN Weekly dose was decreased by 14% to 90 mg per week, RTC 1 week.  Patient also reports ongoing shortness of breath since hospitalization for asthma/COPD on 11/21/17. ACT score of 8 as well as CAT of 24 indicating poor control. Currrent regimen includes high-dose ICS with LABA (Dulera), montelukast, and PRN albuterol and Duoneb. PCP approved addition of Spiriva. Provided Spiriva sample today and provided patient education. We considered the 3-in-1 inhaler today (Trelegy), however, patient's insurance is not active until July. Will re-consider Trelegy as a possibility in the future.  Patient was also advised to quit smoking, he states he is not ready/intersted in quitting today. Will re-visit again in the future.  Patient Instructions  Patient educated about medication as defined in this encounter and verbalized understanding by repeating back instructions provided.   Patient advised to contact clinic or seek medical attention if signs/symptoms of bleeding or thromboembolism occur.  Patient  verbalized understanding by repeating back information and was advised to contact me if further medication-related questions arise. Patient was also provided an information handout.  Follow-up Return in about 1 week (around 01/02/2018).  Flossie Dibble  30 minutes spent face-to-face with the patient during the encounter. 50% of time spent on education. 50% of time was spent on assessment, plan, coordination of care.

## 2017-12-26 NOTE — Addendum Note (Signed)
Addended by: Forde Dandy on: 12/26/2017 01:39 PM   Modules accepted: Orders

## 2017-12-29 NOTE — Progress Notes (Signed)
Internal Medicine Clinic Attending  Case discussed with Dr. LaCroce  at the time of the visit.  We reviewed the resident's history and exam and pertinent patient test results.  I agree with the assessment, diagnosis, and plan of care documented in the resident's note.  Alexander N Raines, MD   

## 2018-01-02 ENCOUNTER — Ambulatory Visit (INDEPENDENT_AMBULATORY_CARE_PROVIDER_SITE_OTHER): Payer: Medicare Other | Admitting: Pharmacist

## 2018-01-02 DIAGNOSIS — Z5181 Encounter for therapeutic drug level monitoring: Secondary | ICD-10-CM

## 2018-01-02 DIAGNOSIS — Z86718 Personal history of other venous thrombosis and embolism: Secondary | ICD-10-CM

## 2018-01-02 DIAGNOSIS — I82501 Chronic embolism and thrombosis of unspecified deep veins of right lower extremity: Secondary | ICD-10-CM

## 2018-01-02 DIAGNOSIS — Z7901 Long term (current) use of anticoagulants: Secondary | ICD-10-CM

## 2018-01-02 LAB — POCT INR: INR: 2.6 (ref 2.0–3.0)

## 2018-01-02 NOTE — Progress Notes (Signed)
Anticoagulation Management [name] [name] a 52 y.o.malewho reports to the clinic for monitoring of warfarintreatment.   Indication:DVT Duration:indefinite Supervising physician:[facility]  Anticoagulation Clinic Visit History: Patientdoes notreport signs/symptoms of bleeding or thromboembolism  Other recent changes:Denies anydiet, medications, lifestylechanges  Anticoagulation Episode Summary    Current INR goal:   2.0-3.0  TTR:   55.2 % (2.7 y)  Next INR check:   01/16/2018  INR from last check:   2.6 (01/02/2018)  Weekly max warfarin dose:     Target end date:     INR check location:     Preferred lab:     Send INR reminders to:   ANTICOAG IMP   Indications   Chronic deep vein thrombosis (DVT) of right lower extremity ([place]) [I82.501] Long-term (current) use of anticoagulants [Z79.01] DVT (Resolved) [I82.409]       Comments:         Anticoagulation Care Providers    Provider Role Specialty Phone number   Bartholomew Crews, MD  Internal Medicine [phone]     Allergies  Allergen Reactions  . Xarelto [Rivaroxaban] Dermatitis    Blisters skin  . Clindamycin/Lincomycin Dermatitis    Severe rash/dermatitis from November 2015 still present Jan 2016 from clinda   Medication Sig  acetaminophen (TYLENOL) 325 MG tablet Take 2 tablets (650 mg total) by mouth every 6 (six) hours as needed. Patient taking differently: Take 650 mg by mouth every 6 (six) hours as needed for mild pain.   albuterol (PROVENTIL HFA;VENTOLIN HFA) 108 (90 Base) MCG/ACT inhaler Inhale 2 puffs into the lungs every 6 (six) hours as needed for wheezing or shortness of breath.  albuterol (PROVENTIL) (2.5 MG/3ML) 0.083% nebulizer solution INHALE 1 VIAL VIA NEBULIZER EVERY 6 HOURS AS NEEDED FOR WHEEZING OR SHORTNESS OF BREATH  cetirizine (ZYRTEC) 10 MG tablet TAKE 1 Tablet BY MOUTH ONCE DAILY  diclofenac sodium (VOLTAREN) 1 % GEL Apply 2 g topically 4 (four) times daily as needed (pain).   DULoxetine (CYMBALTA) 60 MG capsule Take 1 capsule (60 mg total) by mouth daily.  fluconazole (DIFLUCAN) 100 MG tablet Take 1 tablet (100 mg total) by mouth daily. Take 2 on the first day  furosemide (LASIX) 40 MG tablet Take 1 tablet (40 mg total) by mouth daily.  gabapentin (NEURONTIN) 300 MG capsule Take 3 capsules (900 mg total) by mouth 3 (three) times daily.  hydrocerin (EUCERIN) CREA Apply 1 application topically daily.  hydrocortisone cream 1 % Apply 1 application topically 2 (two) times daily. Patient taking differently: Apply 1 application topically 2 (two) times daily as needed for itching.   ibuprofen (ADVIL,MOTRIN) 800 MG tablet Take 1 tablet (800 mg total) by mouth every 8 (eight) hours as needed. Patient taking differently: Take 800 mg by mouth every 8 (eight) hours as needed for moderate pain.   ipratropium-albuterol (DUONEB) 0.5-2.5 (3) MG/3ML SOLN Take 3 mLs by nebulization every 6 (six) hours as needed.  mometasone-formoterol (DULERA) 200-5 MCG/ACT AERO Inhale 2 puffs into the lungs 2 (two) times daily.  montelukast (SINGULAIR) 10 MG tablet Take 1 tablet (10 mg total) by mouth at bedtime.  nicotine (NICOTROL) 10 MG inhaler Inhale 1 cartridge (puff for 20 minutes) as needed. Use at least 6 cartridges/day the first 3-6 weeks, reduce gradually over 12 weeks Patient taking differently: Inhale 1 continuous puffing into the lungs daily as needed for smoking cessation.   oxyCODONE-acetaminophen (PERCOCET) 7.5-325 MG tablet Take 1 tablet by mouth every 6 (six) hours as needed for severe pain.  pantoprazole (PROTONIX) 40 MG tablet Take 1 tablet (40 mg total) by mouth 2 (two) times daily.  tiotropium (SPIRIVA HANDIHALER) 18 MCG inhalation capsule Place 1 capsule (18 mcg total) into inhaler and inhale daily. IM program  traZODone (DESYREL) 100 MG tablet Take 1 tablet (100 mg total) by mouth at bedtime as needed for sleep. Patient taking differently: Take 100 mg by mouth at bedtime.    warfarin (COUMADIN) 5 MG tablet Take 3 tablets by mouth, once-daily at 6PM. IM Program.   Past Medical History:  Diagnosis Date  . Alcohol abuse   . Arthritis    knees  . Asthma    uses Albuterol daily as needed  . Bipolar 1 disorder ([place])   . Bronchitis    last time 2 yrs ago  . Cellulitis   . Chronic dermatitis    due to Xarelto Rx  . Cough    smokers  . Depression   . DVT (deep venous thrombosis) ([facility])    on Coumadin Rx  . DVT (deep venous thrombosis) ([place])   . GERD (gastroesophageal reflux disease)    only takes something about 2 times a yr  . GERD (gastroesophageal reflux disease)   . Heart murmur   . Joint pain   . Pulmonary embolism ([place]) 2014  . Shortness of breath   . Substance abuse ([place])    crack, cocaine, last use 2007  . Tobacco abuse   . Tuberculosis    ' I test Positive "   Social History   Socioeconomic History  . Marital status: Single    Spouse name: Not on file  . Number of children: Not on file  . Years of education: Not on file  . Highest education level: Not on file  Occupational History  . Occupation: Dentist: UNEMPLOYED  Social Needs  . Financial resource strain: Not on file  . Food insecurity:    Worry: Not on file    Inability: Not on file  . Transportation needs:    Medical: Not on file    Non-medical: Not on file  Tobacco Use  . Smoking status: Current Every Day Smoker    Packs/day: 0.50    Types: Cigarettes  . Smokeless tobacco: Former Systems developer  . Tobacco comment: 11/21/17: "0.5 PPD since age 39"  Substance and Sexual Activity  . Alcohol use: No  . Drug use: No    Types: Cocaine    Comment: last use 2016  . Sexual activity: Yes    Partners: Female  Lifestyle  . Physical activity:    Days per week: Not on file    Minutes per session: Not on file  . Stress: Not on file  Relationships  . Social connections:    Talks on phone: Not on file    Gets together: Not on file    Attends religious service: Not on  file    Active member of club or organization: Not on file    Attends meetings of clubs or organizations: Not on file    Relationship status: Not on file  Other Topics Concern  . Not on file  Social History Narrative   Working at [employer] now unemployed.  States he has insurance through them now.    Lives with mom and step dad          Family History  Problem Relation Age of Onset  . Asthma Mother   . Throat cancer Father  ASSESSMENT Recent Results: Lab Results  Component Value Date   INR 2.6 01/02/2018   INR 4.0 (A) 12/26/2017   INR 3.6 (A) 12/19/2017   Anticoagulation Dosing: Description   Take 3 of your 5mg  peach-colored warfarin tablets by mouth, once-daily, at [facility] each day.      INR today: Therapeutic  PLAN Weekly dose was unchanged  Patient Instructions  Patient educated about medication as defined in this encounter and verbalized understanding by repeating back instructions provided.   Patient advised to contact clinic or seek medical attention if signs/symptoms of bleeding or thromboembolism occur.  Patient verbalized understanding by repeating back information and was advised to contact me if further medication-related questions arise. Patient was also provided an information handout.  Follow-up Return in about 2 weeks (around 01/16/2018).  Flossie Dibble  15 minutes spent face-to-face with the patient during the encounter. 50% of time spent on education. 50% of time was spent on assessment, plan, and coordination of care.

## 2018-01-02 NOTE — Patient Instructions (Signed)
Patient educated about medication as defined in this encounter and verbalized understanding by repeating back instructions provided.   

## 2018-01-04 NOTE — Progress Notes (Signed)
I reviewed Dr. [name]'s note.  INR at goal.  

## 2018-01-16 ENCOUNTER — Ambulatory Visit (INDEPENDENT_AMBULATORY_CARE_PROVIDER_SITE_OTHER): Payer: Medicare Other | Admitting: Pharmacist

## 2018-01-16 DIAGNOSIS — I82501 Chronic embolism and thrombosis of unspecified deep veins of right lower extremity: Secondary | ICD-10-CM | POA: Diagnosis not present

## 2018-01-16 DIAGNOSIS — J4540 Moderate persistent asthma, uncomplicated: Secondary | ICD-10-CM | POA: Diagnosis not present

## 2018-01-16 LAB — POCT INR: INR: 2.9 (ref 2.0–3.0)

## 2018-01-16 MED ORDER — MONTELUKAST SODIUM 10 MG PO TABS: 10.0000 mg | ORAL_TABLET | Freq: Every day | ORAL | 11 refills | Status: DC

## 2018-01-16 NOTE — Progress Notes (Signed)
Anticoagulation Management [name] [name] a 52 y.o.malewho reports to the clinic for monitoring of warfarintreatment.   Indication:DVT Duration:indefinite Supervising physician:James [place]  Anticoagulation Clinic Visit History: Patientdoes notreport signs/symptoms of bleeding or thromboembolism  Other recent changes:Denies anydiet, medications, lifestylechanges  Anticoagulation Episode Summary    Current INR goal:   2.0-3.0  TTR:   55.8 % (2.8 y)  Next INR check:   02/13/2018  INR from last check:   2.9 (01/16/2018)  Weekly max warfarin dose:     Target end date:     INR check location:     Preferred lab:     Send INR reminders to:   ANTICOAG IMP   Indications   Chronic deep vein thrombosis (DVT) of right lower extremity ([place]) [I82.501] Long-term (current) use of anticoagulants [Z79.01] DVT (Resolved) [I82.409]       Comments:         Anticoagulation Care Providers    Provider Role Specialty Phone number   Bartholomew Crews, MD  Internal Medicine [phone]     Allergies  Allergen Reactions  . Xarelto [Rivaroxaban] Dermatitis    Blisters skin  . Clindamycin/Lincomycin Dermatitis    Severe rash/dermatitis from November 2015 still present Jan 2016 from clinda   Medication Sig  acetaminophen (TYLENOL) 325 MG tablet Take 2 tablets (650 mg total) by mouth every 6 (six) hours as needed. Patient taking differently: Take 650 mg by mouth every 6 (six) hours as needed for mild pain.   albuterol (PROVENTIL HFA;VENTOLIN HFA) 108 (90 Base) MCG/ACT inhaler Inhale 2 puffs into the lungs every 6 (six) hours as needed for wheezing or shortness of breath.  albuterol (PROVENTIL) (2.5 MG/3ML) 0.083% nebulizer solution INHALE 1 VIAL VIA NEBULIZER EVERY 6 HOURS AS NEEDED FOR WHEEZING OR SHORTNESS OF BREATH  cetirizine (ZYRTEC) 10 MG tablet TAKE 1 Tablet BY MOUTH ONCE DAILY  diclofenac sodium (VOLTAREN) 1 % GEL Apply 2 g topically 4 (four) times daily as needed  (pain).  DULoxetine (CYMBALTA) 60 MG capsule Take 1 capsule (60 mg total) by mouth daily.  fluconazole (DIFLUCAN) 100 MG tablet Take 1 tablet (100 mg total) by mouth daily. Take 2 on the first day  furosemide (LASIX) 40 MG tablet Take 1 tablet (40 mg total) by mouth daily.  gabapentin (NEURONTIN) 300 MG capsule Take 3 capsules (900 mg total) by mouth 3 (three) times daily.  hydrocerin (EUCERIN) CREA Apply 1 application topically daily.  hydrocortisone cream 1 % Apply 1 application topically 2 (two) times daily. Patient taking differently: Apply 1 application topically 2 (two) times daily as needed for itching.   ibuprofen (ADVIL,MOTRIN) 800 MG tablet Take 1 tablet (800 mg total) by mouth every 8 (eight) hours as needed. Patient taking differently: Take 800 mg by mouth every 8 (eight) hours as needed for moderate pain.   ipratropium-albuterol (DUONEB) 0.5-2.5 (3) MG/3ML SOLN Take 3 mLs by nebulization every 6 (six) hours as needed.  mometasone-formoterol (DULERA) 200-5 MCG/ACT AERO Inhale 2 puffs into the lungs 2 (two) times daily.  montelukast (SINGULAIR) 10 MG tablet Take 1 tablet (10 mg total) by mouth at bedtime.  nicotine (NICOTROL) 10 MG inhaler Inhale 1 cartridge (puff for 20 minutes) as needed. Use at least 6 cartridges/day the first 3-6 weeks, reduce gradually over 12 weeks Patient taking differently: Inhale 1 continuous puffing into the lungs daily as needed for smoking cessation.   oxyCODONE-acetaminophen (PERCOCET) 7.5-325 MG tablet Take 1 tablet by mouth every 6 (six) hours as needed for severe pain.  pantoprazole (PROTONIX) 40 MG tablet Take 1 tablet (40 mg total) by mouth 2 (two) times daily.  tiotropium (SPIRIVA HANDIHALER) 18 MCG inhalation capsule Place 1 capsule (18 mcg total) into inhaler and inhale daily. IM program  traZODone (DESYREL) 100 MG tablet Take 1 tablet (100 mg total) by mouth at bedtime as needed for sleep. Patient taking differently: Take 100 mg by mouth at  bedtime.   warfarin (COUMADIN) 5 MG tablet Take 3 tablets by mouth, once-daily at 6PM. IM Program.   Past Medical History:  Diagnosis Date  . Alcohol abuse   . Arthritis    knees  . Asthma    uses Albuterol daily as needed  . Bipolar 1 disorder ([place])   . Bronchitis    last time 2 yrs ago  . Cellulitis   . Chronic dermatitis    due to Xarelto Rx  . Cough    smokers  . Depression   . DVT (deep venous thrombosis) ([facility])    on Coumadin Rx  . DVT (deep venous thrombosis) ([place])   . GERD (gastroesophageal reflux disease)    only takes something about 2 times a yr  . GERD (gastroesophageal reflux disease)   . Heart murmur   . Joint pain   . Pulmonary embolism ([place]) 2014  . Shortness of breath   . Substance abuse ([place])    crack, cocaine, last use 2007  . Tobacco abuse   . Tuberculosis    ' I test Positive "   Social History   Socioeconomic History  . Marital status: Single    Spouse name: Not on file  . Number of children: Not on file  . Years of education: Not on file  . Highest education level: Not on file  Occupational History  . Occupation: Dentist: UNEMPLOYED  Social Needs  . Financial resource strain: Not on file  . Food insecurity:    Worry: Not on file    Inability: Not on file  . Transportation needs:    Medical: Not on file    Non-medical: Not on file  Tobacco Use  . Smoking status: Current Every Day Smoker    Packs/day: 0.50    Types: Cigarettes  . Smokeless tobacco: Former Systems developer  . Tobacco comment: 11/21/17: "0.5 PPD since age 72"  Substance and Sexual Activity  . Alcohol use: No  . Drug use: No    Types: Cocaine    Comment: last use 2016  . Sexual activity: Yes    Partners: Female  Lifestyle  . Physical activity:    Days per week: Not on file    Minutes per session: Not on file  . Stress: Not on file  Relationships  . Social connections:    Talks on phone: Not on file    Gets together: Not on file    Attends religious  service: Not on file    Active member of club or organization: Not on file    Attends meetings of clubs or organizations: Not on file    Relationship status: Not on file  Other Topics Concern  . Not on file  Social History Narrative   Working at [employer] now unemployed.  States he has insurance through them now.    Lives with mom and step dad          Family History  Problem Relation Age of Onset  . Asthma Mother   . Throat cancer Father  ASSESSMENT Recent Results: Lab Results  Component Value Date   INR 2.9 01/16/2018   INR 2.6 01/02/2018   INR 4.0 (A) 12/26/2017   Anticoagulation Dosing: Description   Take 3 of your 5mg  peach-colored warfarin tablets by mouth, once-daily, at [facility] each day.      INR today: Therapeutic  PLAN Weekly dose was unchanged. Patient requested refill of montelukast---refill sent due to pending PCP appointment. Patient reports no shortness of breath or other symptoms of concern today.  Patient Instructions  Patient educated about medication as defined in this encounter and verbalized understanding by repeating back instructions provided.   Patient advised to contact clinic or seek medical attention if signs/symptoms of bleeding or thromboembolism occur.  Patient verbalized understanding by repeating back information and was advised to contact me if further medication-related questions arise. Patient was also provided an information handout.  Follow-up Return in about 1 month (around 02/13/2018).  Flossie Dibble  15 minutes spent face-to-face with the patient during the encounter. 80% of time spent on education. 20% of time was spent on assessment, plan, coordination of care.

## 2018-01-16 NOTE — Patient Instructions (Signed)
Patient educated about medication as defined in this encounter and verbalized understanding by repeating back instructions provided.   

## 2018-01-16 NOTE — Progress Notes (Signed)
Reviewed thx DrG 

## 2018-01-23 MED FILL — PANTOPRAZOLE SOD DR 40 MG T: 40 | 30 days supply | Qty: 60 | Fill #0

## 2018-01-25 ENCOUNTER — Ambulatory Visit (HOSPITAL_BASED_OUTPATIENT_CLINIC_OR_DEPARTMENT_OTHER): Payer: Medicare Other | Attending: Internal Medicine | Admitting: Internal Medicine

## 2018-01-25 VITALS — Ht 76.0 in | Wt 275.0 lb

## 2018-01-25 DIAGNOSIS — G4734 Idiopathic sleep related nonobstructive alveolar hypoventilation: Secondary | ICD-10-CM

## 2018-01-25 DIAGNOSIS — G4730 Sleep apnea, unspecified: Secondary | ICD-10-CM | POA: Diagnosis not present

## 2018-02-06 DIAGNOSIS — G4730 Sleep apnea, unspecified: Secondary | ICD-10-CM | POA: Diagnosis not present

## 2018-02-06 NOTE — Procedures (Signed)
   Patient Name: [name] [name] Date: 01/25/2018 Gender: Male D.O.B: [birth year] Age (years): 51 Referring Provider: Oda Kilts Height (inches): 53 Interpreting Physician: Baird Lyons MD, ABSM Weight (lbs): 275 RPSGT: Earney [place] BMI: 33 MRN: [id number] Neck Size: 18.00  CLINICAL INFORMATION Sleep Study Type: NPSG Indication for sleep study: OSA  Epworth Sleepiness Score: 3  SLEEP STUDY TECHNIQUE As per the AASM Manual for the Scoring of Sleep and Associated Events v2.3 (April 2016) with a hypopnea requiring 4% desaturations.  The channels recorded and monitored were frontal, central and occipital EEG, electrooculogram (EOG), submentalis EMG (chin), nasal and oral airflow, thoracic and abdominal wall motion, anterior tibialis EMG, snore microphone, electrocardiogram, and pulse oximetry.  MEDICATIONS Medications self-administered by patient taken the night of the study : GABAPENTIN, SPIRIVA, DULERA, COUMADIN, CYMBALTA, TRAZADONE, zyrtec  SLEEP ARCHITECTURE The study was initiated at 11:07:41 PM and ended at 5:14:10 AM.  Sleep onset time was 21.3 minutes and the sleep efficiency was 91.3%%. The total sleep time was 334.7 minutes.  Stage REM latency was 130.0 minutes.  The patient spent 1.5%% of the night in stage N1 sleep, 85.5%% in stage N2 sleep, 0.0%% in stage N3 and 13% in REM.  Alpha intrusion was absent.  Supine sleep was 54.64%.  RESPIRATORY PARAMETERS The overall apnea/hypopnea index (AHI) was 4.5 per hour. There were 0 total apneas, including 0 obstructive, 0 central and 0 mixed apneas. There were 25 hypopneas and 1 RERAs.  The AHI during Stage REM sleep was 26.2 per hour.  AHI while supine was 2.6 per hour.  The mean oxygen saturation was 89.5%. The minimum SpO2 during sleep was 0.0%.  moderate snoring was noted during this study.  CARDIAC DATA The 2 lead EKG demonstrated sinus rhythm. The mean heart rate was 66.4 beats per minute. Other  EKG findings include: None.  LEG MOVEMENT DATA The total PLMS were 0 with a resulting PLMS index of 0.0. Associated arousal with leg movement index was 0.0 .  IMPRESSIONS - No significant obstructive sleep apnea occurred during this study (AHI = 4.5/h). Events were only noted during REM. - No significant central sleep apnea occurred during this study (CAI = 0.0/h). - Oxygen desaturation was noted during this study (Mean O2 = 89.5%). - The patient snored with moderate snoring volume. - No cardiac abnormalities were noted during this study. - Clinically significant periodic limb movements did not occur during sleep. No significant associated arousals.  DIAGNOSIS - Nocturnal Hypoxemia (327.26 [G47.36 ICD-10])  RECOMMENDATIONS - Assess basis for nocturnal hypoxemia and need for supplemental O2 during sleep. - Be careful with alcohol, sedatives and other CNS depressants that may worsen sleep apnea and disrupt normal sleep architecture. - Sleep hygiene should be reviewed to assess factors that may improve sleep quality. - Weight management and regular exercise should be initiated or continued if appropriate.  [Electronically signed] 02/06/2018 01:45 PM  Baird Lyons MD, [place], American Board of Sleep Medicine   NPI: [id number]                          [place], [place] [facility]  ELECTRONICALLY SIGNED ON:  02/06/2018, 1:41 PM [place] PH: [phone]   FX: [phone] [place]

## 2018-02-12 ENCOUNTER — Ambulatory Visit (INDEPENDENT_AMBULATORY_CARE_PROVIDER_SITE_OTHER): Payer: Medicare Other | Admitting: Pharmacist

## 2018-02-12 ENCOUNTER — Other Ambulatory Visit: Payer: Self-pay | Admitting: Pharmacist

## 2018-02-12 ENCOUNTER — Other Ambulatory Visit: Payer: Self-pay | Admitting: Internal Medicine

## 2018-02-12 DIAGNOSIS — Z86718 Personal history of other venous thrombosis and embolism: Secondary | ICD-10-CM

## 2018-02-12 DIAGNOSIS — I82501 Chronic embolism and thrombosis of unspecified deep veins of right lower extremity: Secondary | ICD-10-CM

## 2018-02-12 DIAGNOSIS — I2699 Other pulmonary embolism without acute cor pulmonale: Secondary | ICD-10-CM

## 2018-02-12 DIAGNOSIS — Z7901 Long term (current) use of anticoagulants: Secondary | ICD-10-CM

## 2018-02-12 DIAGNOSIS — I82511 Chronic embolism and thrombosis of right femoral vein: Secondary | ICD-10-CM

## 2018-02-12 DIAGNOSIS — Z5181 Encounter for therapeutic drug level monitoring: Secondary | ICD-10-CM

## 2018-02-12 LAB — POCT INR: INR: 1.2 — AB (ref 2.0–3.0)

## 2018-02-12 MED ORDER — WARFARIN SODIUM 5 MG PO TABS: ORAL_TABLET | ORAL | 2 refills | Status: DC

## 2018-02-12 MED FILL — WARFARIN SODIUM 5 MG TABLET: 5 | 28 days supply | Qty: 84 | Fill #0

## 2018-02-12 NOTE — Progress Notes (Signed)
Anticoagulation Management [name] [name] a 52 y.o.malewho reports to the clinic for monitoring of warfarintreatment.   Indication:DVT Duration:indefinite Supervising [employer]  Anticoagulation Clinic Visit History: Patientdoes notreport signs/symptoms of bleeding or thromboembolism  Other recent changes:Denies anydiet, medications, lifestylechanges. He does admit to running out of warfarin, has not taken it in 5 days.  Anticoagulation Episode Summary    Current INR goal:   2.0-3.0  TTR:   55.7 % (2.8 y)  Next INR check:   02/19/2018  INR from last check:   1.2! (02/12/2018)  Weekly max warfarin dose:     Target end date:     INR check location:     Preferred lab:     Send INR reminders to:   ANTICOAG [facility]   Indications   Chronic deep vein thrombosis (DVT) of right lower extremity ([place]) [I82.501] Long-term (current) use of anticoagulants [Z79.01] DVT (Resolved) [I82.409]       Comments:         Anticoagulation Care Providers    Provider Role Specialty Phone number   Bartholomew Crews, MD  Internal Medicine [phone]     Allergies  Allergen Reactions  . Xarelto [Rivaroxaban] Dermatitis    Blisters skin  . Clindamycin/Lincomycin Dermatitis    Severe rash/dermatitis from November 2015 still present Jan 2016 from clinda   Medication Sig  acetaminophen (TYLENOL) 325 MG tablet Take 2 tablets (650 mg total) by mouth every 6 (six) hours as needed. Patient taking differently: Take 650 mg by mouth every 6 (six) hours as needed for mild pain.   albuterol (PROVENTIL HFA;VENTOLIN HFA) 108 (90 Base) MCG/ACT inhaler Inhale 2 puffs into the lungs every 6 (six) hours as needed for wheezing or shortness of breath.  albuterol (PROVENTIL) (2.5 MG/3ML) 0.083% nebulizer solution INHALE 1 VIAL VIA NEBULIZER EVERY 6 HOURS AS NEEDED FOR WHEEZING OR SHORTNESS OF BREATH  cetirizine (ZYRTEC) 10 MG tablet TAKE 1 Tablet BY MOUTH ONCE DAILY  diclofenac sodium  (VOLTAREN) 1 % GEL Apply 2 g topically 4 (four) times daily as needed (pain).  DULoxetine (CYMBALTA) 60 MG capsule Take 1 capsule (60 mg total) by mouth daily.  fluconazole (DIFLUCAN) 100 MG tablet Take 1 tablet (100 mg total) by mouth daily. Take 2 on the first day  furosemide (LASIX) 40 MG tablet Take 1 tablet (40 mg total) by mouth daily.  gabapentin (NEURONTIN) 300 MG capsule Take 3 capsules (900 mg total) by mouth 3 (three) times daily.  hydrocerin (EUCERIN) CREA Apply 1 application topically daily.  hydrocortisone cream 1 % Apply 1 application topically 2 (two) times daily. Patient taking differently: Apply 1 application topically 2 (two) times daily as needed for itching.   ibuprofen (ADVIL,MOTRIN) 800 MG tablet Take 1 tablet (800 mg total) by mouth every 8 (eight) hours as needed. Patient taking differently: Take 800 mg by mouth every 8 (eight) hours as needed for moderate pain.   ipratropium-albuterol (DUONEB) 0.5-2.5 (3) MG/3ML SOLN Take 3 mLs by nebulization every 6 (six) hours as needed.  mometasone-formoterol (DULERA) 200-5 MCG/ACT AERO Inhale 2 puffs into the lungs 2 (two) times daily.  montelukast (SINGULAIR) 10 MG tablet Take 1 tablet (10 mg total) by mouth at bedtime.  nicotine (NICOTROL) 10 MG inhaler Inhale 1 cartridge (puff for 20 minutes) as needed. Use at least 6 cartridges/day the first 3-6 weeks, reduce gradually over 12 weeks Patient taking differently: Inhale 1 continuous puffing into the lungs daily as needed for smoking cessation.   oxyCODONE-acetaminophen (PERCOCET) 7.5-325 MG  tablet Take 1 tablet by mouth every 6 (six) hours as needed for severe pain.  pantoprazole (PROTONIX) 40 MG tablet Take 1 tablet (40 mg total) by mouth 2 (two) times daily.  tiotropium (SPIRIVA HANDIHALER) 18 MCG inhalation capsule Place 1 capsule (18 mcg total) into inhaler and inhale daily. IM program  traZODone (DESYREL) 100 MG tablet Take 1 tablet (100 mg total) by mouth at bedtime as needed  for sleep. Patient taking differently: Take 100 mg by mouth at bedtime.   warfarin (COUMADIN) 5 MG tablet Take 3 tablets by mouth, once-daily at 6PM. IM Program.   Past Medical History:  Diagnosis Date  . Alcohol abuse   . Arthritis    knees  . Asthma    uses Albuterol daily as needed  . Bipolar 1 disorder ([place])   . Bronchitis    last time 2 yrs ago  . Cellulitis   . Chronic dermatitis    due to Xarelto Rx  . Cough    smokers  . Depression   . DVT (deep venous thrombosis) ([facility])    on Coumadin Rx  . DVT (deep venous thrombosis) ([place])   . GERD (gastroesophageal reflux disease)    only takes something about 2 times a yr  . GERD (gastroesophageal reflux disease)   . Heart murmur   . Joint pain   . Pulmonary embolism ([place]) 2014  . Shortness of breath   . Substance abuse ([place])    crack, cocaine, last use 2007  . Tobacco abuse   . Tuberculosis    ' I test Positive "   Social History   Socioeconomic History  . Marital status: Single    Spouse name: Not on file  . Number of children: Not on file  . Years of education: Not on file  . Highest education level: Not on file  Occupational History  . Occupation: Dentist: UNEMPLOYED  Social Needs  . Financial resource strain: Not on file  . Food insecurity:    Worry: Not on file    Inability: Not on file  . Transportation needs:    Medical: Not on file    Non-medical: Not on file  Tobacco Use  . Smoking status: Current Every Day Smoker    Packs/day: 0.50    Types: Cigarettes  . Smokeless tobacco: Former Systems developer  . Tobacco comment: 11/21/17: "0.5 PPD since age 68"  Substance and Sexual Activity  . Alcohol use: No  . Drug use: No    Types: Cocaine    Comment: last use 2016  . Sexual activity: Yes    Partners: Female  Lifestyle  . Physical activity:    Days per week: Not on file    Minutes per session: Not on file  . Stress: Not on file  Relationships  . Social connections:    Talks on phone: Not on  file    Gets together: Not on file    Attends religious service: Not on file    Active member of club or organization: Not on file    Attends meetings of clubs or organizations: Not on file    Relationship status: Not on file  Other Topics Concern  . Not on file  Social History Narrative   Working at [employer] now unemployed.  States he has insurance through them now.    Lives with mom and step dad          Family History  Problem Relation  Age of Onset  . Asthma Mother   . Throat cancer Father    ASSESSMENT Recent Results: Lab Results  Component Value Date   INR 1.2 (A) 02/12/2018   INR 2.9 01/16/2018   INR 2.6 01/02/2018   Anticoagulation Dosing: Description   Take 3 of your 5mg  peach-colored warfarin tablets by mouth, once-daily, at [facility] each day.      INR today: Subtherapeutic  PLAN Weekly dose was unchanged, provided adherence education. RTC 1 week for INR re-check  Patient Instructions  Patient educated about medication as defined in this encounter and verbalized understanding by repeating back instructions provided.   Patient advised to contact clinic or seek medical attention if signs/symptoms of bleeding or thromboembolism occur.  Patient verbalized understanding by repeating back information and was advised to contact me if further medication-related questions arise. Patient was also provided an information handout.  Follow-up Return in about 1 week (around 02/19/2018).  Flossie Dibble

## 2018-02-12 NOTE — Patient Instructions (Signed)
Patient educated about medication as defined in this encounter and verbalized understanding by repeating back instructions provided.   

## 2018-02-12 NOTE — Telephone Encounter (Signed)
Refill Request   warfarin (COUMADIN) 5 MG tablet

## 2018-02-12 NOTE — Progress Notes (Signed)
INTERNAL MEDICINE TEACHING ATTENDING ADDENDUM ° °I agree with pharmacy recommendations as outlined in their note.  ° °-Duncan Vincent MD ° °

## 2018-02-19 ENCOUNTER — Ambulatory Visit: Payer: Self-pay | Admitting: Pharmacist

## 2018-02-22 ENCOUNTER — Ambulatory Visit (INDEPENDENT_AMBULATORY_CARE_PROVIDER_SITE_OTHER): Payer: Medicare Other | Admitting: Pharmacist

## 2018-02-22 ENCOUNTER — Other Ambulatory Visit: Payer: Self-pay | Admitting: Pharmacist

## 2018-02-22 DIAGNOSIS — Z86718 Personal history of other venous thrombosis and embolism: Secondary | ICD-10-CM | POA: Diagnosis not present

## 2018-02-22 DIAGNOSIS — Z5181 Encounter for therapeutic drug level monitoring: Secondary | ICD-10-CM | POA: Diagnosis not present

## 2018-02-22 DIAGNOSIS — Z7901 Long term (current) use of anticoagulants: Secondary | ICD-10-CM

## 2018-02-22 DIAGNOSIS — I82501 Chronic embolism and thrombosis of unspecified deep veins of right lower extremity: Secondary | ICD-10-CM

## 2018-02-22 DIAGNOSIS — M5416 Radiculopathy, lumbar region: Secondary | ICD-10-CM

## 2018-02-22 DIAGNOSIS — J449 Chronic obstructive pulmonary disease, unspecified: Secondary | ICD-10-CM

## 2018-02-22 LAB — POCT INR: INR: 2.2 (ref 2.0–3.0)

## 2018-02-22 NOTE — Progress Notes (Signed)
Anticoagulation Management [name] [name] is a 52 y.o. male who reports to the clinic for monitoring of warfarin treatment.    Indication: DVT Duration: indefinite Supervising physician: Lalla Brothers  Anticoagulation Clinic Visit History: Patient does not report signs/symptoms of bleeding or thromboembolism. Patient does endorse small amount of bruising on ankles. Reports being adherent to warfarin.  Other recent changes: Denies any diet, medications, lifestyle changes.  Anticoagulation Episode Summary    Current INR goal:   2.0-3.0  TTR:   55.7 % (2.8 y)  Next INR check:   02/19/2018  INR from last check:   1.2! (02/12/2018)  Weekly max warfarin dose:     Target end date:     INR check location:     Preferred lab:     Send INR reminders to:   ANTICOAG IMP   Indications   Chronic deep vein thrombosis (DVT) of right lower extremity ([place]) [I82.501] Long-term (current) use of anticoagulants [Z79.01] DVT (Resolved) [I82.409]       Comments:         Anticoagulation Care Providers    Provider Role Specialty Phone number   Bartholomew Crews, MD  Internal Medicine [phone]      Allergies  Allergen Reactions  . Xarelto [Rivaroxaban] Dermatitis    Blisters skin  . Clindamycin/Lincomycin Dermatitis    Severe rash/dermatitis from November 2015 still present Jan 2016 from clinda   Prior to Admission medications   Medication Sig  acetaminophen (TYLENOL) 325 MG tablet Take 2 tablets (650 mg total) by mouth every 6 (six) hours as needed. Patient taking differently: Take 650 mg by mouth every 6 (six) hours as needed for mild pain.   albuterol (PROVENTIL HFA;VENTOLIN HFA) 108 (90 Base) MCG/ACT inhaler Inhale 2 puffs into the lungs every 6 (six) hours as needed for wheezing or shortness of breath.  albuterol (PROVENTIL) (2.5 MG/3ML) 0.083% nebulizer solution INHALE 1 VIAL VIA NEBULIZER EVERY 6 HOURS AS NEEDED FOR WHEEZING OR SHORTNESS OF BREATH  cetirizine (ZYRTEC) 10 MG tablet  TAKE 1 Tablet BY MOUTH ONCE DAILY  diclofenac sodium (VOLTAREN) 1 % GEL Apply 2 g topically 4 (four) times daily as needed (pain).  DULoxetine (CYMBALTA) 60 MG capsule Take 1 capsule (60 mg total) by mouth daily.  furosemide (LASIX) 40 MG tablet Take 1 tablet (40 mg total) by mouth daily.  gabapentin (NEURONTIN) 300 MG capsule Take 3 capsules (900 mg total) by mouth 3 (three) times daily.  hydrocerin (EUCERIN) CREA Apply 1 application topically daily.  hydrocortisone cream 1 % Apply 1 application topically 2 (two) times daily. Patient taking differently: Apply 1 application topically 2 (two) times daily as needed for itching.   ibuprofen (ADVIL,MOTRIN) 800 MG tablet Take 1 tablet (800 mg total) by mouth every 8 (eight) hours as needed. Patient taking differently: Take 800 mg by mouth every 8 (eight) hours as needed for moderate pain.   ipratropium-albuterol (DUONEB) 0.5-2.5 (3) MG/3ML SOLN Take 3 mLs by nebulization every 6 (six) hours as needed.  mometasone-formoterol (DULERA) 200-5 MCG/ACT AERO Inhale 2 puffs into the lungs 2 (two) times daily.  montelukast (SINGULAIR) 10 MG tablet Take 1 tablet (10 mg total) by mouth at bedtime.  nicotine (NICOTROL) 10 MG inhaler Inhale 1 cartridge (puff for 20 minutes) as needed. Use at least 6 cartridges/day the first 3-6 weeks, reduce gradually over 12 weeks Patient taking differently: Inhale 1 continuous puffing into the lungs daily as needed for smoking cessation.   oxyCODONE-acetaminophen (PERCOCET) 7.5-325 MG tablet Take  1 tablet by mouth every 6 (six) hours as needed for severe pain.  pantoprazole (PROTONIX) 40 MG tablet Take 1 tablet (40 mg total) by mouth 2 (two) times daily.  tiotropium (SPIRIVA HANDIHALER) 18 MCG inhalation capsule Place 1 capsule (18 mcg total) into inhaler and inhale daily. IM program  traZODone (DESYREL) 100 MG tablet Take 1 tablet (100 mg total) by mouth at bedtime as needed for sleep. Patient taking differently: Take 100 mg by  mouth at bedtime.   warfarin (COUMADIN) 5 MG tablet Take 3 tablets by mouth, once-daily at 6PM. IM Program.   Past Medical History:  Diagnosis Date  . Alcohol abuse   . Arthritis    knees  . Asthma    uses Albuterol daily as needed  . Bipolar 1 disorder ([place])   . Bronchitis    last time 2 yrs ago  . Cellulitis   . Chronic dermatitis    due to Xarelto Rx  . Cough    smokers  . Depression   . DVT (deep venous thrombosis) ([facility])    on Coumadin Rx  . DVT (deep venous thrombosis) ([place])   . GERD (gastroesophageal reflux disease)    only takes something about 2 times a yr  . GERD (gastroesophageal reflux disease)   . Heart murmur   . Joint pain   . Pulmonary embolism ([place]) 2014  . Shortness of breath   . Substance abuse ([place])    crack, cocaine, last use 2007  . Tobacco abuse   . Tuberculosis    ' I test Positive "   Social History   Socioeconomic History  . Marital status: Single    Spouse name: Not on file  . Number of children: Not on file  . Years of education: Not on file  . Highest education level: Not on file  Occupational History  . Occupation: Dentist: UNEMPLOYED  Social Needs  . Financial resource strain: Not on file  . Food insecurity:    Worry: Not on file    Inability: Not on file  . Transportation needs:    Medical: Not on file    Non-medical: Not on file  Tobacco Use  . Smoking status: Current Every Day Smoker    Packs/day: 0.50    Types: Cigarettes  . Smokeless tobacco: Former Systems developer  . Tobacco comment: 11/21/17: "0.5 PPD since age 89"  Substance and Sexual Activity  . Alcohol use: No  . Drug use: No    Types: Cocaine    Comment: last use 2016  . Sexual activity: Yes    Partners: Female  Lifestyle  . Physical activity:    Days per week: Not on file    Minutes per session: Not on file  . Stress: Not on file  Relationships  . Social connections:    Talks on phone: Not on file    Gets together: Not on file    Attends  religious service: Not on file    Active member of club or organization: Not on file    Attends meetings of clubs or organizations: Not on file    Relationship status: Not on file  Other Topics Concern  . Not on file  Social History Narrative   Working at [employer] now unemployed.  States he has insurance through them now.    Lives with mom and step dad          Family History  Problem Relation Age of  Onset  . Asthma Mother   . Throat cancer Father     ASSESSMENT Recent Results: The most recent result is correlated with 90 mg per week: Lab Results  Component Value Date   INR 1.2 (A) 02/12/2018   INR 2.9 01/16/2018   INR 2.6 01/02/2018    Anticoagulation Dosing: Take 3 5mg  tablets on Sunday, Tuesday, Thursday and Saturday. Take 2 5mg  tablets on Mondays, Wednesdays, and Fridays.    INR today: Therapeutic (2.2)  PLAN Weekly dose was not changed. RTC in 4 weeks for INR re-check.  Patient advised to contact clinic or seek medical attention if signs/symptoms of bleeding or thromboembolism occur.  Patient verbalized understanding by repeating back information and was advised to contact me if further medication-related questions arise. Patient was also provided an information handout.  Follow-up Return in about 4 weeks (around 03/22/2018).  Patrina Levering, PharmD Candidate

## 2018-02-22 NOTE — Patient Instructions (Signed)
Patient educated about medication as defined in this encounter and verbalized understanding by repeating back instructions provided.   

## 2018-02-25 MED ORDER — TIOTROPIUM BROMIDE MONOHYDRATE 18 MCG IN CAPS: 18.0000 ug | ORAL_CAPSULE | Freq: Every day | RESPIRATORY_TRACT | 3 refills | Status: DC

## 2018-02-25 MED ORDER — DULOXETINE HCL 60 MG PO CPEP: 60.0000 mg | ORAL_CAPSULE | Freq: Every day | ORAL | 3 refills | Status: DC

## 2018-03-19 MED FILL — SPIRIVA 18 MCG CP-HANDIHALE: 18 | 30 days supply | Qty: 30 | Fill #0

## 2018-03-19 MED FILL — DULoxetine HCL 60 MG CPEP: 60 | 90 days supply | Qty: 90 | Fill #0

## 2018-03-21 ENCOUNTER — Ambulatory Visit: Payer: Self-pay | Admitting: Pharmacist

## 2018-03-22 ENCOUNTER — Ambulatory Visit: Payer: Self-pay | Admitting: Pharmacist

## 2018-03-22 MED FILL — WARFARIN SODIUM 5 MG TABLET: 5 | 28 days supply | Qty: 84 | Fill #1

## 2018-04-08 ENCOUNTER — Encounter: Payer: Self-pay | Admitting: Internal Medicine

## 2018-04-08 ENCOUNTER — Ambulatory Visit (INDEPENDENT_AMBULATORY_CARE_PROVIDER_SITE_OTHER): Payer: Medicare Other | Admitting: Internal Medicine

## 2018-04-08 ENCOUNTER — Ambulatory Visit (INDEPENDENT_AMBULATORY_CARE_PROVIDER_SITE_OTHER): Payer: Medicare Other | Admitting: Pharmacist

## 2018-04-08 ENCOUNTER — Other Ambulatory Visit: Payer: Self-pay

## 2018-04-08 VITALS — BP 134/86 | HR 79 | Temp 98.1°F | Wt 263.4 lb

## 2018-04-08 DIAGNOSIS — I82501 Chronic embolism and thrombosis of unspecified deep veins of right lower extremity: Secondary | ICD-10-CM

## 2018-04-08 DIAGNOSIS — G4736 Sleep related hypoventilation in conditions classified elsewhere: Secondary | ICD-10-CM

## 2018-04-08 DIAGNOSIS — F319 Bipolar disorder, unspecified: Secondary | ICD-10-CM

## 2018-04-08 DIAGNOSIS — Z5181 Encounter for therapeutic drug level monitoring: Secondary | ICD-10-CM | POA: Diagnosis not present

## 2018-04-08 DIAGNOSIS — Z0000 Encounter for general adult medical examination without abnormal findings: Secondary | ICD-10-CM

## 2018-04-08 DIAGNOSIS — Z720 Tobacco use: Secondary | ICD-10-CM

## 2018-04-08 DIAGNOSIS — Z86718 Personal history of other venous thrombosis and embolism: Secondary | ICD-10-CM | POA: Diagnosis not present

## 2018-04-08 DIAGNOSIS — J4540 Moderate persistent asthma, uncomplicated: Secondary | ICD-10-CM | POA: Diagnosis not present

## 2018-04-08 DIAGNOSIS — M5416 Radiculopathy, lumbar region: Secondary | ICD-10-CM

## 2018-04-08 DIAGNOSIS — Z79899 Other long term (current) drug therapy: Secondary | ICD-10-CM

## 2018-04-08 DIAGNOSIS — Z7901 Long term (current) use of anticoagulants: Secondary | ICD-10-CM | POA: Diagnosis not present

## 2018-04-08 DIAGNOSIS — Z7951 Long term (current) use of inhaled steroids: Secondary | ICD-10-CM | POA: Diagnosis not present

## 2018-04-08 DIAGNOSIS — Z23 Encounter for immunization: Secondary | ICD-10-CM

## 2018-04-08 DIAGNOSIS — K219 Gastro-esophageal reflux disease without esophagitis: Secondary | ICD-10-CM

## 2018-04-08 DIAGNOSIS — G8929 Other chronic pain: Secondary | ICD-10-CM

## 2018-04-08 DIAGNOSIS — G4700 Insomnia, unspecified: Secondary | ICD-10-CM | POA: Diagnosis not present

## 2018-04-08 LAB — POCT INR: INR: 2 (ref 2.0–3.0)

## 2018-04-08 MED ORDER — TRAZODONE HCL 100 MG PO TABS: 100.0000 mg | ORAL_TABLET | Freq: Every evening | ORAL | 3 refills | Status: DC | PRN

## 2018-04-08 NOTE — Assessment & Plan Note (Signed)
Recurrent DVTs: Assessment: Patient is on warfarin daily and attend the coumadin clinic on a monthly basis. Last INR on 02/22/18 was 2.2. He has chronic right lower extremity swelling and bilateral lower extremity pain which he reports has been unchanged since his last visit. He reports that if his symptoms worsened he would go straight to the ER because it would likely be another blood clot.   Plan: -Continue to attend coumadin clinic with frequent INR checks

## 2018-04-08 NOTE — Assessment & Plan Note (Signed)
Asthma: Assessment: Patient is on singulair 10mg  daily, albuterol nebulizer daily, dulera 2 puffs 2 times a day, and spiriva daily. He reports that he is using his albuterol inhaler up to 2-3 times a day and due to shortness of breath, wheezing, and chest tightness. He reports that the symptoms are worse at night around 7-9 pm. He also reports poor sleep, he will wake up 2-3 times a night due to shortness of breath and will need to use his inhaler at that time. He had a sleep study done which was negative for obstructive sleep apnea. He saw an ENT, Dr. Janace Hoard, on 12/26/17, had a laryngoscope that showed inflammatory findings and thickened vocal cords with evidence of irritation, started back on protonix BID, he reports that he has been taking only daily. He reports that he is still smoking, he smokes about 4-5 cigerrettes day, he states that he has the nicotine inhaler and patches at home but states that he has not been using them. We discussed that his smoking is worsening his asthma symptoms and the importance of decreasing and stopping smoking.   Plan: -Continue dulera 2 puffs twice a day -Continue spiriva -Continue singular 10 mg daily -Continue albuterol nebulizer -Continue albuterol inhaler PRN -Encouraged smoking cessation

## 2018-04-08 NOTE — Assessment & Plan Note (Signed)
  Chronic lumbar radiculopathy Assessment: Patient reports back pain about 2-3 times a month, this lasts about 3 to 4 days.  He is on Cymbalta and gabapentin for other medical conditions but he reports they help a little bit.  He has been using Biofreeze on the area which he reports also helps.  Plan: -Continue to use OTC biofreeze -Continue cymbalta and gabapentin

## 2018-04-08 NOTE — Patient Instructions (Addendum)
Dear Mr. More,  Thank you for coming in today. Today we discussed your chronic medical conditions.   In regards to your asthma please continue to take your medications as prescribed. Please utilize the tools your have available to decrease your smoking, including the nicotine patch and inhalers. This will improve your asthma. If your symptoms worsen or increase in frequency please return and we can consider a prednisone taper.   In regards to your blood clots please continue to follow up with the INR clinic and continue your warfarin as prescribed.   Please continue all of your other prescribed medications.   We sent a referral to the gastroenterologist to get you scheduled for a colonoscopy.   Thank you for coming in today,   [name] [name].D.

## 2018-04-08 NOTE — Assessment & Plan Note (Signed)
Assessment: Patient reports that he has been having trouble sleeping and has been using trazodone to help initiate sleep.  We discussed sleep hygiene.  Patient was requesting refill of trazodone.   Plan: -Refilled trazodone 100 mg nightly as needed

## 2018-04-08 NOTE — Assessment & Plan Note (Signed)
GERD: Assessment: Patient reports that this is been well controlled, he denies any reflux, heartburn, regurgitation, belching, or nighttime symptoms.  He reports that he is only taking Protonix once a day.  Plan: -Continue Protonix 40 mg daily

## 2018-04-08 NOTE — Progress Notes (Signed)
Anticoagulation Management [name] [name] is a 52 y.o. male who reports to the clinic for monitoring of warfarin treatment.    Indication: DVT, chronic--RLE; long term (current) use of anticoagulant.   Duration: indefinite Supervising physician: Aldine Contes  Anticoagulation Clinic Visit History: Patient does not report signs/symptoms of bleeding or thromboembolism  Other recent changes: No diet, medications, lifestyle changes endorsed.  Anticoagulation Episode Summary    Current INR goal:   2.0-3.0  TTR:   57.2 % (3 y)  Next INR check:   04/30/2018  INR from last check:   2.0 (04/08/2018)  Weekly max warfarin dose:     Target end date:     INR check location:     Preferred lab:     Send INR reminders to:   ANT[facility]   Indications   Chronic deep vein thrombosis (DVT) of right lower extremity ([place]) [I82.501] Long-term (current) use of anticoagulants [Z79.01] DVT (Resolved) [I82.409]       Comments:         Anticoagulation Care Providers    Provider Role Specialty Phone number   Bartholomew Crews, MD  Internal Medicine [phone]      Allergies  Allergen Reactions  . Xarelto [Rivaroxaban] Dermatitis    Blisters skin  . Clindamycin/Lincomycin Dermatitis    Severe rash/dermatitis from November 2015 still present Jan 2016 from clinda   Prior to Admission medications   Medication Sig Start Date End Date Taking? Authorizing Provider  albuterol (PROVENTIL HFA;VENTOLIN HFA) 108 (90 Base) MCG/ACT inhaler Inhale 2 puffs into the lungs every 6 (six) hours as needed for wheezing or shortness of breath. 03/16/17  Yes Nedrud, Larena Glassman, MD  albuterol (PROVENTIL) (2.5 MG/3ML) 0.083% nebulizer solution INHALE 1 VIAL VIA NEBULIZER EVERY 6 HOURS AS NEEDED FOR WHEEZING OR SHORTNESS OF BREATH 12/28/17  Yes Nedrud, Larena Glassman, MD  cetirizine (ZYRTEC) 10 MG tablet TAKE 1 Tablet BY MOUTH ONCE DAILY 12/28/17  Yes Nedrud, Larena Glassman, MD  diclofenac sodium (VOLTAREN) 1 % GEL Apply 2 g  topically 4 (four) times daily as needed (pain). 11/26/17  Yes Rice, Resa Miner, MD  DULoxetine (CYMBALTA) 60 MG capsule Take 1 capsule (60 mg total) by mouth daily. 02/25/18  Yes Forde Dandy, PharmD  furosemide (LASIX) 40 MG tablet Take 1 tablet (40 mg total) by mouth daily. 09/03/17  Yes Nedrud, Larena Glassman, MD  gabapentin (NEURONTIN) 300 MG capsule Take 3 capsules (900 mg total) by mouth 3 (three) times daily. 09/03/17  Yes Nedrud, Larena Glassman, MD  hydrocortisone cream 1 % Apply 1 application topically 2 (two) times daily. Patient taking differently: Apply 1 application topically 2 (two) times daily as needed for itching.  09/03/17  Yes Nedrud, Larena Glassman, MD  mometasone-formoterol (DULERA) 200-5 MCG/ACT AERO Inhale 2 puffs into the lungs 2 (two) times daily. 04/10/17  Yes Nedrud, Larena Glassman, MD  montelukast (SINGULAIR) 10 MG tablet Take 1 tablet (10 mg total) by mouth at bedtime. 01/16/18  Yes Forde Dandy, PharmD  nicotine (NICOTROL) 10 MG inhaler Inhale 1 cartridge (puff for 20 minutes) as needed. Use at least 6 cartridges/day the first 3-6 weeks, reduce gradually over 12 weeks Patient taking differently: Inhale 1 continuous puffing into the lungs daily as needed for smoking cessation.  07/14/16  Yes Lucious Groves, DO  pantoprazole (PROTONIX) 40 MG tablet Take 1 tablet (40 mg total) by mouth 2 (two) times daily. 09/03/17  Yes Nedrud, Larena Glassman, MD  tiotropium (SPIRIVA HANDIHALER) 18 MCG inhalation capsule Place 1 capsule (18 mcg total)  into inhaler and inhale daily. IM program 02/25/18 02/25/19 Yes Forde Dandy, PharmD  traZODone (DESYREL) 100 MG tablet Take 1 tablet (100 mg total) by mouth at bedtime as needed for sleep. 04/08/18  Yes [name], MD  warfarin (COUMADIN) 5 MG tablet Take 3 tablets by mouth, once-daily at [facility]. IM Program. 02/12/18  Yes Forde Dandy, PharmD   Past Medical History:  Diagnosis Date  . Alcohol abuse   . Arthritis    knees  . Asthma    uses Albuterol daily as  needed  . Bipolar 1 disorder ([place])   . Bronchitis    last time 2 yrs ago  . Cellulitis   . Chronic dermatitis    due to Xarelto Rx  . Cough    smokers  . Depression   . DVT (deep venous thrombosis) ([facility])    on Coumadin Rx  . DVT (deep venous thrombosis) ([place])   . GERD (gastroesophageal reflux disease)    only takes something about 2 times a yr  . GERD (gastroesophageal reflux disease)   . Heart murmur   . Joint pain   . Pulmonary embolism ([place]) 2014  . Shortness of breath   . Substance abuse ([place])    crack, cocaine, last use 2007  . Tobacco abuse   . Tuberculosis    ' I test Positive "   Social History   Socioeconomic History  . Marital status: Single    Spouse name: Not on file  . Number of children: Not on file  . Years of education: Not on file  . Highest education level: Not on file  Occupational History  . Occupation: Dentist: UNEMPLOYED  Social Needs  . Financial resource strain: Not on file  . Food insecurity:    Worry: Not on file    Inability: Not on file  . Transportation needs:    Medical: Not on file    Non-medical: Not on file  Tobacco Use  . Smoking status: Current Every Day Smoker    Packs/day: 0.50    Types: Cigarettes  . Smokeless tobacco: Former Systems developer  . Tobacco comment: 11/21/17: "0.5 PPD since age 75"  Substance and Sexual Activity  . Alcohol use: No  . Drug use: No    Types: Cocaine    Comment: last use 2016  . Sexual activity: Yes    Partners: Female  Lifestyle  . Physical activity:    Days per week: Not on file    Minutes per session: Not on file  . Stress: Not on file  Relationships  . Social connections:    Talks on phone: Not on file    Gets together: Not on file    Attends religious service: Not on file    Active member of club or organization: Not on file    Attends meetings of clubs or organizations: Not on file    Relationship status: Not on file  Other Topics Concern  . Not on file  Social History  Narrative   Working at [employer] now unemployed.  States he has insurance through them now.    Lives with mom and step dad          Family History  Problem Relation Age of Onset  . Asthma Mother   . Throat cancer Father     ASSESSMENT Recent Results: The most recent result is correlated with 90 mg per week: Lab Results  Component Value Date  INR 2.0 04/08/2018   INR 2.2 02/22/2018   INR 1.2 (A) 02/12/2018    Anticoagulation Dosing: Description   Take 3 of your 5mg  peach-colored warfarin tablets by mouth, once-daily, at [facility] on Sundays, Tuesdays, Thursdays and Saturdays; On Mondays, Wednesdays and Fridays--take only 2 & 1/2 tablets of your 5mg  peach-colored warfarin tablets.      INR today: Therapeutic  PLAN Weekly dose was increased by 8% to 97.5 mg per week  Patient Instructions  Patient instructed to take medications as defined in the Anti-coagulation Track section of this encounter.  Patient instructed to take today's dose.  Patient instructed to take  3 of your 5mg  peach-colored warfarin tablets by mouth, once-daily, at [facility] on Sundays, Tuesdays, Thursdays and Saturdays; On Mondays, Wednesdays and Fridays--take only 2 & 1/2 tablets of your 5mg  peach-colored warfarin tablets. Patient verbalized understanding of these instructions.     Patient advised to contact clinic or seek medical attention if signs/symptoms of bleeding or thromboembolism occur.  Patient verbalized understanding by repeating back information and was advised to contact me if further medication-related questions arise. Patient was also provided an information handout.  Follow-up Return in about 22 days (around 04/30/2018) for Follow up INR at 0900h TUESDAY (Call Ulice Dash on his cell phone). Pennie Banter, Pharm, CACP, CPP  15 minutes spent face-to-face with the patient during the encounter. 50% of time spent on education. 50% of time was spent on fingerstick point of care INR sample  collection, processing, results determination, dose adjustment and documentation in http://www.kim.net/.

## 2018-04-08 NOTE — Patient Instructions (Signed)
Patient instructed to take medications as defined in the Anti-coagulation Track section of this encounter.  Patient instructed to take today's dose.  Patient instructed to take  3 of your 5mg  peach-colored warfarin tablets by mouth, once-daily, at [facility] on Sundays, Tuesdays, Thursdays and Saturdays; On Mondays, Wednesdays and Fridays--take only 2 & 1/2 tablets of your 5mg  peach-colored warfarin tablets. Patient verbalized understanding of these instructions.

## 2018-04-08 NOTE — Assessment & Plan Note (Signed)
Flu shot today.  Referred for screening colonoscopy.

## 2018-04-08 NOTE — Progress Notes (Signed)
CC: Chronic medical condition management  HPI:  [name] is a 52 y.o. with a history of chronic DVTs, asthma, GERD, OSA, bipolar disorder, and tobacco use presenting for management of his chronic medical conditions.   Recurrent DVTs: Assessment: Patient is on warfarin daily and attend the coumadin clinic on a monthly basis. Last INR on 02/22/18 was 2.2. He has chronic right lower extremity swelling and bilateral lower extremity pain which he reports has been unchanged since his last visit. He reports that if his symptoms worsened he would go straight to the ER because it would likely be another blood clot.   Asthma: Assessment: Patient is on singulair 10mg  daily, albuterol nebulizer daily, dulera 2 puffs 2 times a day, and spiriva daily. He reports that he is using his albuterol inhaler up to 2-3 times a day and due to shortness of breath, wheezing, and chest tightness. He reports that the symptoms are worse at night around 7-9 pm. He also reports poor sleep, he will wake up 2-3 times a night due to shortness of breath and will need to use his inhaler at that time. He had a sleep study done which was negative for obstructive sleep apnea He also reports that he saw an ENT doctor about 2 weeks ago for possible vocal cord dysfunction, he states he was restarted on his reflux medication and told to return if his symptoms worsened. He reports that he is still smoking, he smokes about 4-5 cigerrettes day, he states that he has the nicotine inhaler and patches at home but states that he has not been using them. We discussed that his smoking is worsening his asthma symptoms and the importance of decreasing and stopping smoking.   OSA Assessment: He had a sleep study done to assess for sleep apnea which showed an AHI of 4.5/hour during REM sleep with no significant obstructive sleep apnea, he did have nocturnal hypoxemia with an oxygen desaturation, mean O2 = 89.5%. He does not have OSA and does not  meet criteria for nocturnal oxygen at the moment.   GERD: Assessment: Patient reports that this is been well controlled, he denies any reflux, heartburn, regurgitation, belching, or nighttime symptoms.  He reports that he is only taking Protonix once a day.  Chronic lumbar radiculopathy Assessment: Patient reports having lower back pain about 2-3 times a month, this lasts about 3 to 4 days.  He is on Cymbalta and gabapentin for other medical conditions but he reports they help his back pain a little bit.  He has been using Biofreeze on the area which he reports also helps.  Please see A&P for status of the patient's chronic medical conditions  Past Medical History:  Diagnosis Date  . Alcohol abuse   . Arthritis    knees  . Asthma    uses Albuterol daily as needed  . Bipolar 1 disorder ([place])   . Bronchitis    last time 2 yrs ago  . Cellulitis   . Chronic dermatitis    due to Xarelto Rx  . Cough    smokers  . Depression   . DVT (deep venous thrombosis) ([facility])    on Coumadin Rx  . DVT (deep venous thrombosis) ([place])   . GERD (gastroesophageal reflux disease)    only takes something about 2 times a yr  . GERD (gastroesophageal reflux disease)   . Heart murmur   . Joint pain   . Pulmonary embolism ([place]) 2014  . Shortness of breath   .  Substance abuse ([place])    crack, cocaine, last use 2007  . Tobacco abuse   . Tuberculosis    ' I test Positive "   Review of Systems:  Review of Systems  Constitutional: Negative for chills, fever, malaise/fatigue and weight loss.  HENT: Negative for congestion, ear pain and sinus pain.   Eyes: Negative for pain and discharge.  Respiratory: Positive for shortness of breath and wheezing. Negative for cough.   Cardiovascular: Negative for chest pain, palpitations and orthopnea.  Gastrointestinal: Negative for abdominal pain, constipation, diarrhea, nausea and vomiting.  Genitourinary: Negative for dysuria, frequency and urgency.  Musculoskeletal:  Negative for back pain, joint pain and myalgias.  Skin: Negative for itching and rash.  Neurological: Negative for dizziness, weakness and headaches.  Psychiatric/Behavioral: Negative for depression. The patient is not nervous/anxious.      Physical Exam:  Vitals:   04/08/18 1506  BP: 134/86  Pulse: 79  Temp: 98.1 F (36.7 C)  TempSrc: Oral  SpO2: 96%  Weight: 263 lb 6.4 oz (119.5 kg)   Physical Exam  Constitutional: He is oriented to person, place, and time and well-developed, well-nourished, and in no distress.  HENT:  Head: Normocephalic and atraumatic.  Eyes: Pupils are equal, round, and reactive to light. Conjunctivae and EOM are normal.  Neck: Normal range of motion. Neck supple. No thyromegaly present.  Cardiovascular: Normal rate, regular rhythm and normal heart sounds. Exam reveals no gallop and no friction rub.  No murmur heard. Pulmonary/Chest: Effort normal. No respiratory distress. He has wheezes (diffuse, mild expiratory wheezing).  Abdominal: Soft. Bowel sounds are normal. He exhibits no distension.  Musculoskeletal: Normal range of motion.  Neurological: He is alert and oriented to person, place, and time. Gait normal.  Skin: Skin is warm and dry. Rash (bilateral lower extremity posterior dryness with skin thickening) noted. No erythema.  Psychiatric: Mood and affect normal.     Social History   Socioeconomic History  . Marital status: Single    Spouse name: Not on file  . Number of children: Not on file  . Years of education: Not on file  . Highest education level: Not on file  Occupational History  . Occupation: Dentist: UNEMPLOYED  Social Needs  . Financial resource strain: Not on file  . Food insecurity:    Worry: Not on file    Inability: Not on file  . Transportation needs:    Medical: Not on file    Non-medical: Not on file  Tobacco Use  . Smoking status: Current Every Day Smoker    Packs/day: 0.50    Types: Cigarettes    . Smokeless tobacco: Former Systems developer  . Tobacco comment: 11/21/17: "0.5 PPD since age 61"  Substance and Sexual Activity  . Alcohol use: No  . Drug use: No    Types: Cocaine    Comment: last use 2016  . Sexual activity: Yes    Partners: Female  Lifestyle  . Physical activity:    Days per week: Not on file    Minutes per session: Not on file  . Stress: Not on file  Relationships  . Social connections:    Talks on phone: Not on file    Gets together: Not on file    Attends religious service: Not on file    Active member of club or organization: Not on file    Attends meetings of clubs or organizations: Not on file    Relationship status: Not  on file  . Intimate partner violence:    Fear of current or ex partner: Not on file    Emotionally abused: Not on file    Physically abused: Not on file    Forced sexual activity: Not on file  Other Topics Concern  . Not on file  Social History Narrative   Working at [employer] now unemployed.  States he has insurance through them now.    Lives with mom and step dad           Family History  Problem Relation Age of Onset  . Asthma Mother   . Throat cancer Father     Assessment & Plan:   See Encounters Tab for problem based charting.  Patient seen with Dr. Dareen Piano

## 2018-04-09 ENCOUNTER — Telehealth: Payer: Self-pay | Admitting: *Deleted

## 2018-04-09 NOTE — Telephone Encounter (Signed)
LVM FOR PATIENT TO RETURN CALL TO [facility] REGARDING HIS GI REFERRAL. NEEDING TO KNOW AS TO IF HE HAS BEEN SEEN IN PAST TO GI DR. / WHO.

## 2018-04-10 ENCOUNTER — Encounter: Payer: Self-pay | Admitting: Nurse Practitioner

## 2018-04-11 NOTE — Progress Notes (Signed)
INTERNAL MEDICINE TEACHING ATTENDING ADDENDUM - Sinjin Amero M.D  Duration- indefinite, Indication- recurrent DVT, INR- therapeutic. Agree with pharmacy recommendations as outlined in their note.     

## 2018-04-11 NOTE — Progress Notes (Signed)
Internal Medicine Clinic Attending  I saw and evaluated the patient.  I personally confirmed the key portions of the history and exam documented by Dr. Krienke and I reviewed pertinent patient test results.  The assessment, diagnosis, and plan were formulated together and I agree with the documentation in the resident's note.    

## 2018-04-23 ENCOUNTER — Encounter: Payer: Self-pay | Admitting: Nurse Practitioner

## 2018-04-23 ENCOUNTER — Telehealth: Payer: Self-pay

## 2018-04-23 ENCOUNTER — Ambulatory Visit (INDEPENDENT_AMBULATORY_CARE_PROVIDER_SITE_OTHER): Payer: Medicare Other | Admitting: Nurse Practitioner

## 2018-04-23 VITALS — BP 130/86 | HR 76 | Ht 76.0 in | Wt 261.0 lb

## 2018-04-23 DIAGNOSIS — Z7901 Long term (current) use of anticoagulants: Secondary | ICD-10-CM | POA: Diagnosis not present

## 2018-04-23 DIAGNOSIS — Z1211 Encounter for screening for malignant neoplasm of colon: Secondary | ICD-10-CM

## 2018-04-23 MED ORDER — PEG 3350-KCL-NA BICARB-NACL 420 G PO SOLR: 4000.0000 mL | Freq: Once | ORAL | 0 refills | Status: AC

## 2018-04-23 MED FILL — PEG-3350 SOLUTION: 420 | 1 days supply | Qty: 4000 | Fill #0

## 2018-04-23 NOTE — Patient Instructions (Signed)
If you are age 52 or older, your body mass index should be between 23-30. Your Body mass index is 31.77 kg/m. If this is out of the aforementioned range listed, please consider follow up with your Primary Care Provider.  If you are age 9 or younger, your body mass index should be between 19-25. Your Body mass index is 31.77 kg/m. If this is out of the aformentioned range listed, please consider follow up with your Primary Care Provider.   You have been scheduled for a colonoscopy. Please follow written instructions given to you at your visit today.  Please pick up your prep supplies at the pharmacy within the next 1-3 days. If you use inhalers (even only as needed), please bring them with you on the day of your procedure. Your physician has requested that you go to www.startemmi.com and enter the access code given to you at your visit today. This web site gives a general overview about your procedure. However, you should still follow specific instructions given to you by our office regarding your preparation for the procedure.  We have sent the following medications to your pharmacy for you to pick up at your convenience: [place] will be contacted by our office prior to your procedure for directions on holding your Plavix.  If you do not hear from our office 1 week prior to your scheduled procedure, please call [phone] to discuss.   Congrats on stop date planned to quit smoking.  Thank you for choosing me and [facility].   Tye Savoy, NP
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
## 2018-04-23 NOTE — Telephone Encounter (Signed)
   [facility] [address] [place],   [address] Phone:  [phone]   Fax:  [phone]  04/23/2018   RE:      SPENSER HARREN DOB:   1965/12/08 MRN:   [id number]   Dear Dr Aggie Hacker,    We have scheduled the above patient for an endoscopic procedure. Our records show that he is on anticoagulation therapy.   Please advise as to whether the patient may come off his therapy of Coumadin five days prior to the procedure, which is scheduled for 05/01/18.  Please fax back/ or route to [facility] at [phone].   Sincerely,    Thurmon Fair, RMA

## 2018-04-23 NOTE — Progress Notes (Signed)
Reviewed. I agree with documentation including the assessment and plan.  Emonii Wienke L. Morayo Leven, MD, MPH 

## 2018-04-25 ENCOUNTER — Telehealth: Payer: Self-pay | Admitting: Internal Medicine

## 2018-04-25 ENCOUNTER — Telehealth: Payer: Self-pay

## 2018-04-25 NOTE — Telephone Encounter (Signed)
Hello Ms. Cyndi Bender, Yes that should be fine, he can stop his Coumadin for his procedure. Thank you!

## 2018-04-25 NOTE — Telephone Encounter (Signed)
Rec'd phone call and fax  Peter Congo @ [facility]  Requesting to, please advise as to whether the patient may come off his therapy of coumadin five days prior to his Endoscopic procedure that is sch for 05/01/2018. Please advise. Gloria @ [facility].

## 2018-04-25 NOTE — Telephone Encounter (Signed)
Hi Dr. Sherry Ruffing, just wanted to forward this note to you. Were you able to follow up with periop anticoagulant recommendations?  Thank you,  Mannie Stabile

## 2018-04-25 NOTE — Telephone Encounter (Signed)
Per Dr. Sherry Ruffing okay to hold Coumadin five days prior to procedure. Patient verbalized understanding.

## 2018-04-25 NOTE — Telephone Encounter (Signed)
Spoke with Mae at PCP office regarding holding Coumadin five days.  She states she just received fax today.  Waiting on a response from physician.  I have called and left a message on patients voicemail stating to wait to take his coumadin in the morning(take a little later in the day) until he has heard back from [name] in the morning if not today.

## 2018-04-26 NOTE — Telephone Encounter (Signed)
Hi Dr. Maudie Mercury,  Yes I followed up on this. Thank you for the reminder.  [name]

## 2018-04-26 NOTE — Telephone Encounter (Signed)
Notes faxed to [facility] on 04/25/2018 and phone call made directly to [facility] to see notes in [place].

## 2018-04-30 ENCOUNTER — Telehealth: Payer: Self-pay | Admitting: *Deleted

## 2018-04-30 ENCOUNTER — Telehealth: Payer: Self-pay | Admitting: Gastroenterology

## 2018-04-30 ENCOUNTER — Ambulatory Visit (INDEPENDENT_AMBULATORY_CARE_PROVIDER_SITE_OTHER): Payer: Medicare Other | Admitting: Pharmacist

## 2018-04-30 DIAGNOSIS — Z7901 Long term (current) use of anticoagulants: Secondary | ICD-10-CM

## 2018-04-30 DIAGNOSIS — I82501 Chronic embolism and thrombosis of unspecified deep veins of right lower extremity: Secondary | ICD-10-CM

## 2018-04-30 DIAGNOSIS — Z5181 Encounter for therapeutic drug level monitoring: Secondary | ICD-10-CM | POA: Diagnosis not present

## 2018-04-30 LAB — POCT INR: INR: 1.1 — AB (ref 2.0–3.0)

## 2018-04-30 NOTE — Telephone Encounter (Signed)
Dr. Tarri Glenn,  [name] [name] called to get clarification on his prep prior to his colonoscopy tomorrow at 9:00 Am. This patient saw Nevin Bloodgood in the office and received prep instructions for Miralax. Patient states he went to the pharmacy and he was given Golytely, his words a big jug. Patient followed instructions that were on the box which are not our instructions. I spoke to the patient about 4:45 and he states that he already drank half of his prep. Patient states that he is getting a yellow return after drinking the prep.   I instructed the patient to drink the second half of the prep six hours before his procedure at 9:00 Am. Patient will drink the rest of the prep beginning at 3:00 Am. I instructed the patient to follow the prep with clear liquids up until 6:00 Am and then nothing after that time. Patient states that he has been on clear liquids all day. The patient did not take the dulcolax that we typically give with Golytely.   I am not sure how he ended up with this prep and I didn't see where it was ordered? I told [name] [name] to continue on as I directed. He verbalizes understanding. I just wanted to let you know! I hope it works!   Riki Sheer, LPN (PV )

## 2018-04-30 NOTE — Patient Instructions (Signed)
Patient instructed to take medications as defined in the Anti-coagulation Track section of this encounter.  Patient instructed to OMIT today's dose.  Patient instructed to OMIT today's dose, DO NOT TAKE ANY WARFARIN prior to your planned colonoscopy, tomorrow, Wednesday 23-OCT-19. Patient instructed AFTER your colonoscopy, take 3 of your 5mg  peach-colored warfarin tablets by mouth, once-daily, at [facility] on Sundays, Tuesdays, Thursdays and Saturdays; On Mondays, Wednesdays and Fridays--take only 2 & 1/2 tablets of your 5mg  peach-colored warfarin tablets.  Patient verbalized understanding of these instructions.

## 2018-04-30 NOTE — Telephone Encounter (Signed)
I agree with your plan as outlined. Thank you for being so helpful to [name] [name].

## 2018-04-30 NOTE — Telephone Encounter (Signed)
Thank you :)

## 2018-04-30 NOTE — Telephone Encounter (Signed)
LVM WITH PHONE NUMBER TO THE MAMMOGRAM SCHOLARSHIP  PHONE NUMBER. [phone].

## 2018-04-30 NOTE — Progress Notes (Signed)
Anticoagulation Management [name] [name] is a 52 y.o. male who reports to the clinic for monitoring of warfarin treatment.    Indication: DVT, chronic of RLE; long term current use of anticoagulant.   Duration: indefinite Supervising physician: [facility] Visit History: Patient does not report signs/symptoms of bleeding or thromboembolism  Other recent changes: No diet, medications, lifestyle changes except as noted in patient findings.  Anticoagulation Episode Summary    Current INR goal:   2.0-3.0  TTR:   56.1 % (3 y)  Next INR check:   05/14/2018  INR from last check:     Weekly max warfarin dose:     Target end date:     INR check location:     Preferred lab:     Send INR reminders to:   ANTICOAG IMP   Indications   Chronic deep vein thrombosis (DVT) of right lower extremity ([place]) [I82.501] Long-term (current) use of anticoagulants [Z79.01] DVT (Resolved) [I82.409]       Comments:         Anticoagulation Care Providers    Provider Role Specialty Phone number   Bartholomew Crews, MD  Internal Medicine [phone]      Allergies  Allergen Reactions  . Xarelto [Rivaroxaban] Dermatitis    Blisters skin  . Clindamycin/Lincomycin Dermatitis    Severe rash/dermatitis from November 2015 still present Jan 2016 from clinda   Prior to Admission medications   Medication Sig Start Date End Date Taking? Authorizing Provider  albuterol (PROVENTIL HFA;VENTOLIN HFA) 108 (90 Base) MCG/ACT inhaler Inhale 2 puffs into the lungs every 6 (six) hours as needed for wheezing or shortness of breath. 03/16/17  Yes Nedrud, Larena Glassman, MD  albuterol (PROVENTIL) (2.5 MG/3ML) 0.083% nebulizer solution INHALE 1 VIAL VIA NEBULIZER EVERY 6 HOURS AS NEEDED FOR WHEEZING OR SHORTNESS OF BREATH 12/28/17  Yes Nedrud, Larena Glassman, MD  cetirizine (ZYRTEC) 10 MG tablet TAKE 1 Tablet BY MOUTH ONCE DAILY 12/28/17  Yes Nedrud, Larena Glassman, MD  diclofenac sodium (VOLTAREN) 1 % GEL Apply  2 g topically 4 (four) times daily as needed (pain). 11/26/17  Yes Rice, Resa Miner, MD  DULoxetine (CYMBALTA) 60 MG capsule Take 1 capsule (60 mg total) by mouth daily. 02/25/18  Yes Forde Dandy, PharmD  furosemide (LASIX) 40 MG tablet Take 1 tablet (40 mg total) by mouth daily. 09/03/17  Yes Nedrud, Larena Glassman, MD  gabapentin (NEURONTIN) 300 MG capsule Take 3 capsules (900 mg total) by mouth 3 (three) times daily. 09/03/17  Yes Nedrud, Larena Glassman, MD  hydrocortisone cream 1 % Apply 1 application topically 2 (two) times daily. Patient taking differently: Apply 1 application topically 2 (two) times daily as needed for itching.  09/03/17  Yes Nedrud, Larena Glassman, MD  mometasone-formoterol (DULERA) 200-5 MCG/ACT AERO Inhale 2 puffs into the lungs 2 (two) times daily. 04/10/17  Yes Nedrud, Larena Glassman, MD  montelukast (SINGULAIR) 10 MG tablet Take 1 tablet (10 mg total) by mouth at bedtime. 01/16/18  Yes Forde Dandy, PharmD  nicotine (NICOTROL) 10 MG inhaler Inhale 1 cartridge (puff for 20 minutes) as needed. Use at least 6 cartridges/day the first 3-6 weeks, reduce gradually over 12 weeks Patient taking differently: Inhale 1 continuous puffing into the lungs daily as needed for smoking cessation.  07/14/16  Yes Lucious Groves, DO  pantoprazole (PROTONIX) 40 MG tablet Take 1 tablet (40 mg total) by mouth 2 (two) times daily. 09/03/17  Yes Nedrud, Larena Glassman, MD  tiotropium (SPIRIVA HANDIHALER) 18 MCG inhalation capsule  Place 1 capsule (18 mcg total) into inhaler and inhale daily. IM program 02/25/18 02/25/19 Yes Forde Dandy, PharmD  traZODone (DESYREL) 100 MG tablet Take 1 tablet (100 mg total) by mouth at bedtime as needed for sleep. 04/08/18  Yes [name], MD  warfarin (COUMADIN) 5 MG tablet Take 3 tablets by mouth, once-daily at [facility]. IM Program. Patient not taking: Reported on 04/30/2018 02/12/18   Forde Dandy, PharmD   Past Medical History:  Diagnosis Date  . Alcohol abuse   . Arthritis     knees  . Asthma    uses Albuterol daily as needed  . Bipolar 1 disorder ([place])   . Bronchitis    last time 2 yrs ago  . Cellulitis   . Chronic dermatitis    due to Xarelto Rx  . Cough    smokers  . Depression   . DVT (deep venous thrombosis) ([facility])    on Coumadin Rx  . DVT (deep venous thrombosis) ([place])   . GERD (gastroesophageal reflux disease)    only takes something about 2 times a yr  . GERD (gastroesophageal reflux disease)   . Heart murmur   . Joint pain   . Pulmonary embolism ([place]) 2014  . Shortness of breath   . Substance abuse ([place])    crack, cocaine, last use 2007  . Tobacco abuse   . Tuberculosis    ' I test Positive "   Social History   Socioeconomic History  . Marital status: Single    Spouse name: Not on file  . Number of children: Not on file  . Years of education: Not on file  . Highest education level: Not on file  Occupational History  . Occupation: Dentist: UNEMPLOYED  Social Needs  . Financial resource strain: Not on file  . Food insecurity:    Worry: Not on file    Inability: Not on file  . Transportation needs:    Medical: Not on file    Non-medical: Not on file  Tobacco Use  . Smoking status: Current Every Day Smoker    Packs/day: 0.50    Types: Cigarettes  . Smokeless tobacco: Former Systems developer  . Tobacco comment: 11/21/17: "0.5 PPD since age 28"  Substance and Sexual Activity  . Alcohol use: No  . Drug use: No    Types: Cocaine    Comment: last use 2016  . Sexual activity: Yes    Partners: Female  Lifestyle  . Physical activity:    Days per week: Not on file    Minutes per session: Not on file  . Stress: Not on file  Relationships  . Social connections:    Talks on phone: Not on file    Gets together: Not on file    Attends religious service: Not on file    Active member of club or organization: Not on file    Attends meetings of clubs or organizations: Not on file    Relationship status: Not on file  Other  Topics Concern  . Not on file  Social History Narrative   Working at [employer] now unemployed.  States he has insurance through them now.    Lives with mom and step dad          Family History  Problem Relation Age of Onset  . Asthma Mother   . Throat cancer Father     ASSESSMENT Recent Results: The most recent result is  correlated with 97.5 mg per week--BUT WITH OMITTED DOSES x past 4 days in advance of planned colonoscopy tomorrow, Wednesday 23-OCT-19: Lab Results  Component Value Date   INR 1.1 (A) 04/30/2018   INR 2.0 04/08/2018   INR 2.2 02/22/2018    Anticoagulation Dosing: Description   AFTER your colonoscopy, take 3 of your 5mg  peach-colored warfarin tablets by mouth, once-daily, at [facility] on Sundays, Tuesdays, Thursdays and Saturdays; On Mondays, Wednesdays and Fridays--take only 2 & 1/2 tablets of your 5mg  peach-colored warfarin tablets.      INR today: Subtherapeutic  PLAN Weekly dose was N/A he is OFF warfarin x 4-5 days for planned colonoscopy. He will resume his usual regimen of 97.5mg  warfarin/week after procedure.   Patient Instructions  Patient instructed to take medications as defined in the Anti-coagulation Track section of this encounter.  Patient instructed to OMIT today's dose.  Patient instructed to OMIT today's dose, DO NOT TAKE ANY WARFARIN prior to your planned colonoscopy, tomorrow, Wednesday 23-OCT-19. Patient instructed AFTER your colonoscopy, take 3 of your 5mg  peach-colored warfarin tablets by mouth, once-daily, at [facility] on Sundays, Tuesdays, Thursdays and Saturdays; On Mondays, Wednesdays and Fridays--take only 2 & 1/2 tablets of your 5mg  peach-colored warfarin tablets.  Patient verbalized understanding of these instructions.     Patient advised to contact clinic or seek medical attention if signs/symptoms of bleeding or thromboembolism occur.  Patient verbalized understanding by repeating back information and was advised to contact  me if further medication-related questions arise. Patient was also provided an information handout.  Follow-up Return in 2 weeks (on 05/14/2018) for Follow up INR at 1000h.  Pennie Banter, PharmD, CPP  15 minutes spent face-to-face with the patient during the encounter. 50% of time spent on education. 50% of time was spent on fingerstick point of care INR sample collection, processing, results determination and documentation in http://www.kim.net/.

## 2018-05-01 ENCOUNTER — Ambulatory Visit (AMBULATORY_SURGERY_CENTER): Payer: Medicare Other | Admitting: Gastroenterology

## 2018-05-01 ENCOUNTER — Encounter: Payer: Self-pay | Admitting: Gastroenterology

## 2018-05-01 VITALS — BP 111/81 | HR 65 | Temp 96.8°F | Resp 13 | Ht 76.0 in | Wt 261.0 lb

## 2018-05-01 DIAGNOSIS — Z1211 Encounter for screening for malignant neoplasm of colon: Secondary | ICD-10-CM

## 2018-05-01 DIAGNOSIS — D122 Benign neoplasm of ascending colon: Secondary | ICD-10-CM

## 2018-05-01 DIAGNOSIS — D124 Benign neoplasm of descending colon: Secondary | ICD-10-CM

## 2018-05-01 DIAGNOSIS — D125 Benign neoplasm of sigmoid colon: Secondary | ICD-10-CM | POA: Diagnosis not present

## 2018-05-01 DIAGNOSIS — D120 Benign neoplasm of cecum: Secondary | ICD-10-CM

## 2018-05-01 MED ORDER — SODIUM CHLORIDE 0.9 % IV SOLN: 500.0000 mL | Freq: Once | INTRAVENOUS | Status: DC

## 2018-05-01 NOTE — Progress Notes (Signed)
A and O x3. Report to RN. Tolerated MAC anesthesia well.

## 2018-05-01 NOTE — Progress Notes (Signed)
Called to room to assist during endoscopic procedure.  Patient ID and intended procedure confirmed with present staff. Received instructions for my participation in the procedure from the performing physician.  

## 2018-05-01 NOTE — Op Note (Signed)
[name] [name]: [name] [name] Procedure Date: 05/01/2018 9:05 AM MRN: [id number] Endoscopist: Thornton Park MD, MD Age: 52 Referring MD:  Date of Birth: 1966-03-01 Gender: Male Account #: [id number] Procedure:                Colonoscopy Indications:              Screening for colorectal malignant neoplasm. No                            family history of colon cancer or polyps. No                            baseline GI symptoms. Medicines:                See the Anesthesia note for documentation of the                            administered medications Procedure:                Pre-Anesthesia Assessment:                           - Prior to the procedure, a History and Physical                            was performed, and patient medications and                            allergies were reviewed. The patient's tolerance of                            previous anesthesia was also reviewed. The risks                            and benefits of the procedure and the sedation                            options and risks were discussed with the patient.                            All questions were answered, and informed consent                            was obtained. Prior Anticoagulants: The patient has                            taken no previous anticoagulant or antiplatelet                            agents. ASA Grade Assessment: II - A patient with                            mild systemic disease. After reviewing the risks  and benefits, the patient was deemed in                            satisfactory condition to undergo the procedure.                           After obtaining informed consent, the colonoscope                            was passed under direct vision. Throughout the                            procedure, the patient's blood pressure, pulse, and                            oxygen saturations were monitored continuously.  The                            Colonoscope was introduced through the anus and                            advanced to the the terminal ileum, with                            identification of the appendiceal orifice and IC                            valve. The colonoscopy was performed without                            difficulty. The patient tolerated the procedure                            well. The quality of the bowel preparation was good. Scope In: 9:11:04 AM Scope Out: 9:25:59 AM Scope Withdrawal Time: 0 hours 13 minutes 30 seconds  Total Procedure Duration: 0 hours 14 minutes 55 seconds  Findings:                 The perianal and digital rectal examinations were                            normal.                           A 4 mm polyp was found in the ileocecal valve. The                            polyp was sessile. The polyp was removed with a                            cold snare. Resection and retrieval were complete.                           A 1 mm polyp was found in the ascending  colon. The                            polyp was sessile. The polyp was removed with a                            cold biopsy forceps. Resection and retrieval were                            complete.                           A 3 mm polyp was found in the descending colon. The                            polyp was sessile. The polyp was removed with a                            cold snare. Resection and retrieval were complete.                           A 3 mm polyp was found in the sigmoid colon. The                            polyp was sessile. The polyp was removed with a                            cold biopsy forceps. Resection and retrieval were                            complete.                           A few small-mouthed diverticula were found in the                            sigmoid colon, descending colon and ascending colon.                           The exam was otherwise without  abnormality on                            direct and retroflexion views. Complications:            No immediate complications. Estimated Blood Loss:     Estimated blood loss: none. Impression:               - One 4 mm polyp at the ileocecal valve, removed                            with a cold snare. Resected and retrieved.                           - One 1 mm polyp in the ascending colon, removed  with a cold biopsy forceps. Resected and retrieved.                           - One 3 mm polyp in the descending colon, removed                            with a cold snare. Resected and retrieved.                           - One 3 mm polyp in the sigmoid colon, removed with                            a cold biopsy forceps. Resected and retrieved.                           - Diverticulosis in the sigmoid colon, in the                            descending colon and in the ascending colon.                           - The examination was otherwise normal on direct                            and retroflexion views. Recommendation:           - Discharge patient to home.                           - Resume previous diet today.                           - Resume Coumadin (warfarin) at prior dose tomorrow.                           - Await pathology results.                           - Repeat colonoscopy in 3 years for surveillance if                            at least 3 polyps are adenomatous. Thornton Park MD, MD 05/01/2018 9:30:40 AM This report has been signed electronically.

## 2018-05-01 NOTE — Patient Instructions (Signed)
YOU HAD AN ENDOSCOPIC PROCEDURE TODAY AT THE [facility]:   Refer to the procedure report that was given to you for any specific questions about what was found during the examination.  If the procedure report does not answer your questions, please call your gastroenterologist to clarify.  If you requested that your care partner not be given the details of your procedure findings, then the procedure report has been included in a sealed envelope for you to review at your convenience later.  YOU SHOULD EXPECT: Some feelings of bloating in the abdomen. Passage of more gas than usual.  Walking can help get rid of the air that was put into your GI tract during the procedure and reduce the bloating. If you had a lower endoscopy (such as a colonoscopy or flexible sigmoidoscopy) you may notice spotting of blood in your stool or on the toilet paper. If you underwent a bowel prep for your procedure, you may not have a normal bowel movement for a few days.  Please Note:  You might notice some irritation and congestion in your nose or some drainage.  This is from the oxygen used during your procedure.  There is no need for concern and it should clear up in a day or so.  SYMPTOMS TO REPORT IMMEDIATELY:   Following lower endoscopy (colonoscopy or flexible sigmoidoscopy):  Excessive amounts of blood in the stool  Significant tenderness or worsening of abdominal pains  Swelling of the abdomen that is new, acute  Fever of 100F or higher    For urgent or emergent issues, a gastroenterologist can be reached at any hour by calling [phone].   DIET:  We do recommend a small meal at first, but then you may proceed to your regular diet.  Drink plenty of fluids but you should avoid alcoholic beverages for 24 hours.  ACTIVITY:  You should plan to take it easy for the rest of today and you should NOT DRIVE or use heavy machinery until tomorrow (because of the sedation medicines used during the test).     FOLLOW UP: Our staff will call the number listed on your records the next business day following your procedure to check on you and address any questions or concerns that you may have regarding the information given to you following your procedure. If we do not reach you, we will leave a message.  However, if you are feeling well and you are not experiencing any problems, there is no need to return our call.  We will assume that you have returned to your regular daily activities without incident.  If any biopsies were taken you will be contacted by phone or by letter within the next 1-3 weeks.  Please call us at [phone] if you have not heard about the biopsies in 3 weeks.    SIGNATURES/CONFIDENTIALITY: You and/or your care partner have signed paperwork which will be entered into your electronic medical record.  These signatures attest to the fact that that the information above on your After Visit Summary has been reviewed and is understood.  Full responsibility of the confidentiality of this discharge information lies with you and/or your care-partner.  Polyp and diverticulosis information given. 

## 2018-05-02 ENCOUNTER — Telehealth: Payer: Self-pay

## 2018-05-02 NOTE — Telephone Encounter (Signed)
  Follow up Call-  Call back number 05/01/2018  Post procedure Call Back phone  # [phone]  Permission to leave phone message Yes  Some recent data might be hidden     Patient questions:  Do you have a fever, pain , or abdominal swelling? No. Pain Score  0 *  Have you tolerated food without any problems? Yes.    Have you been able to return to your normal activities? Yes.    Do you have any questions about your discharge instructions: Diet   No. Medications  No. Follow up visit  No.  Do you have questions or concerns about your Care? No.  Actions: * If pain score is 4 or above: No action needed, pain <4.

## 2018-05-06 ENCOUNTER — Encounter: Payer: Self-pay | Admitting: Gastroenterology

## 2018-05-14 ENCOUNTER — Other Ambulatory Visit: Payer: Self-pay | Admitting: Pharmacist

## 2018-05-14 ENCOUNTER — Ambulatory Visit (INDEPENDENT_AMBULATORY_CARE_PROVIDER_SITE_OTHER): Payer: Medicare Other | Admitting: Pharmacist

## 2018-05-14 ENCOUNTER — Other Ambulatory Visit: Payer: Self-pay | Admitting: Internal Medicine

## 2018-05-14 DIAGNOSIS — Z7901 Long term (current) use of anticoagulants: Secondary | ICD-10-CM | POA: Diagnosis not present

## 2018-05-14 DIAGNOSIS — Z5181 Encounter for therapeutic drug level monitoring: Secondary | ICD-10-CM | POA: Diagnosis not present

## 2018-05-14 DIAGNOSIS — I2699 Other pulmonary embolism without acute cor pulmonale: Secondary | ICD-10-CM

## 2018-05-14 DIAGNOSIS — I82501 Chronic embolism and thrombosis of unspecified deep veins of right lower extremity: Secondary | ICD-10-CM | POA: Diagnosis not present

## 2018-05-14 DIAGNOSIS — G4700 Insomnia, unspecified: Secondary | ICD-10-CM

## 2018-05-14 DIAGNOSIS — I82511 Chronic embolism and thrombosis of right femoral vein: Secondary | ICD-10-CM

## 2018-05-14 LAB — POCT INR: INR: 2.3 (ref 2.0–3.0)

## 2018-05-14 MED ORDER — WARFARIN SODIUM 5 MG PO TABS: ORAL_TABLET | ORAL | 2 refills | Status: DC

## 2018-05-14 MED FILL — WARFARIN SODIUM 5 MG TABLET: 5 | 30 days supply | Qty: 80 | Fill #0

## 2018-05-14 NOTE — Progress Notes (Signed)
Anticoagulation Management [name] [name] is a 52 y.o. male who reports to the clinic for monitoring of warfarin treatment.    Indication: DVT, chronic DVT History of; Long term current use of anticoagulant.   Duration: 1 year Supervising physician: [facility] Visit History: Patient does not report signs/symptoms of bleeding or thromboembolism  Other recent changes: No diet, medications, lifestyle changes. Anticoagulation Episode Summary    Current INR goal:   2.0-3.0  TTR:   55.7 % (3.1 y)  Next INR check:   06/11/2018  INR from last check:   2.3 (05/14/2018)  Weekly max warfarin dose:     Target end date:     INR check location:     Preferred lab:     Send INR reminders to:   ANTICOAG [facility]   Indications   Chronic deep vein thrombosis (DVT) of right lower extremity ([name] [name]) [I82.501] Long-term (current) use of anticoagulants [Z79.01] DVT (Resolved) [I82.409]       Comments:         Anticoagulation Care Providers    Provider Role Specialty Phone number   Bartholomew Crews, MD  Internal Medicine [phone]      Allergies  Allergen Reactions  . Xarelto [Rivaroxaban] Dermatitis    Blisters skin  . Clindamycin/Lincomycin Dermatitis    Severe rash/dermatitis from November 2015 still present Jan 2016 from clinda   Prior to Admission medications   Medication Sig Start Date End Date Taking? Authorizing Provider  albuterol (PROVENTIL HFA;VENTOLIN HFA) 108 (90 Base) MCG/ACT inhaler Inhale 2 puffs into the lungs every 6 (six) hours as needed for wheezing or shortness of breath. 03/16/17  Yes Nedrud, Larena Glassman, MD  albuterol (PROVENTIL) (2.5 MG/3ML) 0.083% nebulizer solution INHALE 1 VIAL VIA NEBULIZER EVERY 6 HOURS AS NEEDED FOR WHEEZING OR SHORTNESS OF BREATH 12/28/17  Yes Nedrud, Larena Glassman, MD  cetirizine (ZYRTEC) 10 MG tablet TAKE 1 Tablet BY MOUTH ONCE DAILY 12/28/17  Yes Nedrud, Larena Glassman, MD  diclofenac sodium (VOLTAREN) 1 % GEL Apply 2 g topically 4  (four) times daily as needed (pain). 11/26/17  Yes Rice, Resa Miner, MD  DULoxetine (CYMBALTA) 60 MG capsule Take 1 capsule (60 mg total) by mouth daily. 02/25/18  Yes Forde Dandy, PharmD  furosemide (LASIX) 40 MG tablet Take 1 tablet (40 mg total) by mouth daily. 09/03/17  Yes Nedrud, Larena Glassman, MD  gabapentin (NEURONTIN) 300 MG capsule Take 3 capsules (900 mg total) by mouth 3 (three) times daily. 09/03/17  Yes Nedrud, Larena Glassman, MD  hydrocortisone cream 1 % Apply 1 application topically 2 (two) times daily. Patient taking differently: Apply 1 application topically 2 (two) times daily as needed for itching.  09/03/17  Yes Nedrud, Larena Glassman, MD  mometasone-formoterol (DULERA) 200-5 MCG/ACT AERO Inhale 2 puffs into the lungs 2 (two) times daily. 04/10/17  Yes Nedrud, Larena Glassman, MD  montelukast (SINGULAIR) 10 MG tablet Take 1 tablet (10 mg total) by mouth at bedtime. 01/16/18  Yes Forde Dandy, PharmD  nicotine (NICOTROL) 10 MG inhaler Inhale 1 cartridge (puff for 20 minutes) as needed. Use at least 6 cartridges/day the first 3-6 weeks, reduce gradually over 12 weeks 07/14/16  Yes Lucious Groves, DO  pantoprazole (PROTONIX) 40 MG tablet Take 1 tablet (40 mg total) by mouth 2 (two) times daily. 09/03/17  Yes Nedrud, Larena Glassman, MD  tiotropium (SPIRIVA HANDIHALER) 18 MCG inhalation capsule Place 1 capsule (18 mcg total) into inhaler and inhale daily. IM program 02/25/18 02/25/19 Yes Forde Dandy, PharmD  traZODone (DESYREL) 100 MG tablet Take 1 tablet (100 mg total) by mouth at bedtime as needed for sleep. 04/08/18  Yes [name], MD  warfarin (COUMADIN) 5 MG tablet Take 3 tablets by mouth, once-daily at [facility]. IM Program. 02/12/18  Yes Forde Dandy, PharmD   Past Medical History:  Diagnosis Date  . Alcohol abuse   . Arthritis    knees  . Asthma    uses Albuterol daily as needed  . Bipolar 1 disorder ([place])   . Bronchitis    last time 2 yrs ago  . Cellulitis   . Chronic dermatitis    due to  Xarelto Rx  . Cough    smokers  . Depression   . DVT (deep venous thrombosis) ([facility])    on Coumadin Rx  . DVT (deep venous thrombosis) ([place])   . GERD (gastroesophageal reflux disease)    only takes something about 2 times a yr  . GERD (gastroesophageal reflux disease)   . Heart murmur   . Joint pain   . Pulmonary embolism ([place]) 2014  . Shortness of breath   . Substance abuse ([place])    crack, cocaine, last use 2007  . Tobacco abuse   . Tuberculosis    ' I test Positive "   Social History   Socioeconomic History  . Marital status: Single    Spouse name: Not on file  . Number of children: Not on file  . Years of education: Not on file  . Highest education level: Not on file  Occupational History  . Occupation: Dentist: UNEMPLOYED  Social Needs  . Financial resource strain: Not on file  . Food insecurity:    Worry: Not on file    Inability: Not on file  . Transportation needs:    Medical: Not on file    Non-medical: Not on file  Tobacco Use  . Smoking status: Current Every Day Smoker    Packs/day: 0.50    Types: Cigarettes  . Smokeless tobacco: Former Systems developer  . Tobacco comment: 11/21/17: "0.5 PPD since age 75"  Substance and Sexual Activity  . Alcohol use: No  . Drug use: No    Types: Cocaine    Comment: last use 2016  . Sexual activity: Yes    Partners: Female  Lifestyle  . Physical activity:    Days per week: Not on file    Minutes per session: Not on file  . Stress: Not on file  Relationships  . Social connections:    Talks on phone: Not on file    Gets together: Not on file    Attends religious service: Not on file    Active member of club or organization: Not on file    Attends meetings of clubs or organizations: Not on file    Relationship status: Not on file  Other Topics Concern  . Not on file  Social History Narrative   Working at [employer] now unemployed.  States he has insurance through them now.    Lives with mom and  step dad          Family History  Problem Relation Age of Onset  . Asthma Mother   . Throat cancer Father     ASSESSMENT Recent Results: The most recent result is correlated with 97.5 mg per week: Lab Results  Component Value Date   INR 2.3 05/14/2018   INR 1.1 (A) 04/30/2018   INR 2.0  04/08/2018    Anticoagulation Dosing: Description   Take 3 of your 5mg  peach-colored warfarin tablets by mouth, once-daily, at [facility] on Sundays, Tuesdays, Thursdays and Saturdays; On Mondays, Wednesdays and Fridays--take only 2 & 1/2 tablets of your 5mg  peach-colored warfarin tablets.      INR today: Therapeutic  PLAN Weekly dose was unchanged.   Patient Instructions  Patient instructed to take medications as defined in the Anti-coagulation Track section of this encounter.  Patient instructed to take today's dose.  Patient instructed to take  3 of your 5mg  peach-colored warfarin tablets by mouth, once-daily, at [facility] on Sundays, Tuesdays, Thursdays and Saturdays; On Mondays, Wednesdays and Fridays--take only 2 & 1/2 tablets of your 5mg  peach-colored warfarin tablets.  Patient verbalized understanding of these instructions.    Patient advised to contact clinic or seek medical attention if signs/symptoms of bleeding or thromboembolism occur.  Patient verbalized understanding by repeating back information and was advised to contact me if further medication-related questions arise. Patient was also provided an information handout.  Follow-up Return in 4 weeks (on 06/11/2018) for Follow up INR at 1100h.  Pennie Banter, PharmD, CCP  15 minutes spent face-to-face with the patient during the encounter. 50% of time spent on education. 50% of time was spent on fingerstick point of care INR sample collection, processing, results determination, and documentation in [email or web address].[email or web address]/.

## 2018-05-14 NOTE — Telephone Encounter (Signed)
Patient walked in requesting refill on traZODone (DESYREL) 100 MG tablet to be called in at  [place], [place] - [address]. [phone] (Phone) [phone] (Fax)

## 2018-05-14 NOTE — Patient Instructions (Signed)
Patient instructed to take medications as defined in the Anti-coagulation Track section of this encounter.  Patient instructed to take today's dose.  Patient instructed to take  3 of your 5mg  peach-colored warfarin tablets by mouth, once-daily, at [facility] on Sundays, Tuesdays, Thursdays and Saturdays; On Mondays, Wednesdays and Fridays--take only 2 & 1/2 tablets of your 5mg  peach-colored warfarin tablets.  Patient verbalized understanding of these instructions.

## 2018-05-14 NOTE — Telephone Encounter (Signed)
Pt is requesting a refill on Warfarin, he did have an INR today.( will route to dr. Elie Confer) Also requesting a RF on Trazodone, RX was sent on 04/08/18 to Mail order pharmacy, pt states he never received it and Is requesting both RX's go to [place].  Pt in IM program. Thanks!  Katie, RN

## 2018-05-20 MED ORDER — TRAZODONE HCL 100 MG PO TABS: 100.0000 mg | ORAL_TABLET | Freq: Every evening | ORAL | 3 refills | Status: DC | PRN

## 2018-06-11 ENCOUNTER — Ambulatory Visit (INDEPENDENT_AMBULATORY_CARE_PROVIDER_SITE_OTHER): Payer: Medicare Other | Admitting: Pharmacist

## 2018-06-11 DIAGNOSIS — Z5181 Encounter for therapeutic drug level monitoring: Secondary | ICD-10-CM

## 2018-06-11 DIAGNOSIS — Z7901 Long term (current) use of anticoagulants: Secondary | ICD-10-CM | POA: Diagnosis not present

## 2018-06-11 DIAGNOSIS — Z86718 Personal history of other venous thrombosis and embolism: Secondary | ICD-10-CM

## 2018-06-11 DIAGNOSIS — J4540 Moderate persistent asthma, uncomplicated: Secondary | ICD-10-CM

## 2018-06-11 DIAGNOSIS — J449 Chronic obstructive pulmonary disease, unspecified: Secondary | ICD-10-CM

## 2018-06-11 DIAGNOSIS — I82501 Chronic embolism and thrombosis of unspecified deep veins of right lower extremity: Secondary | ICD-10-CM

## 2018-06-11 DIAGNOSIS — J45909 Unspecified asthma, uncomplicated: Secondary | ICD-10-CM

## 2018-06-11 LAB — POCT INR: INR: 2.2 (ref 2.0–3.0)

## 2018-06-11 MED ORDER — ALBUTEROL SULFATE HFA 108 (90 BASE) MCG/ACT IN AERS: 2.0000 | INHALATION_SPRAY | Freq: Four times a day (QID) | RESPIRATORY_TRACT | 6 refills | Status: DC | PRN

## 2018-06-11 MED ORDER — MONTELUKAST SODIUM 10 MG PO TABS: 10.0000 mg | ORAL_TABLET | Freq: Every day | ORAL | 11 refills | Status: DC

## 2018-06-11 MED ORDER — TIOTROPIUM BROMIDE MONOHYDRATE 2.5 MCG/ACT IN AERS: 2.0000 | INHALATION_SPRAY | Freq: Every day | RESPIRATORY_TRACT | 11 refills | Status: DC

## 2018-06-11 MED ORDER — MOMETASONE FURO-FORMOTEROL FUM 200-5 MCG/ACT IN AERO: 2.0000 | INHALATION_SPRAY | Freq: Two times a day (BID) | RESPIRATORY_TRACT | 3 refills | Status: DC

## 2018-06-11 MED FILL — MONTELUKAST SOD 10 MG TAB: 10 | 30 days supply | Qty: 30 | Fill #0

## 2018-06-11 MED FILL — VENTOLIN HFA 90 MCG INHALER: 108 (90 BAS | 25 days supply | Qty: 18 | Fill #0

## 2018-06-11 MED FILL — SPIRIVA RESPIMAT INHAL SPRY: 2.5 | 30 days supply | Qty: 4 | Fill #0

## 2018-06-11 NOTE — Progress Notes (Addendum)
Anticoagulation Management [name] [name] is a 52 y.o. male who reports to the clinic for monitoring of warfarin treatment.    Indication: DVT, chronic DVT History of; Long term current use of anticoagulant.   Duration: 1 year Supervising physician: [facility] Visit History: Patient does not report signs/symptoms of bleeding or thromboembolism  Other recent changes: No diet, medications, lifestyle changes.  Anticoagulation Episode Summary    Current INR goal:   2.0-3.0  TTR:   56.8 % (3.2 y)  Next INR check:   07/22/2018  INR from last check:   2.2 (06/11/2018)  Weekly max warfarin dose:     Target end date:     INR check location:     Preferred lab:     Send INR reminders to:   ANT[facility]   Indications   Chronic deep vein thrombosis (DVT) of right lower extremity ([place]) [I82.501] Long-term (current) use of anticoagulants [Z79.01] DVT (Resolved) [I82.409]       Comments:         Anticoagulation Care Providers    Provider Role Specialty Phone number   Bartholomew Crews, MD  Internal Medicine [phone]     Allergies  Allergen Reactions  . Xarelto [Rivaroxaban] Dermatitis    Blisters skin  . Clindamycin/Lincomycin Dermatitis    Severe rash/dermatitis from November 2015 still present Jan 2016 from clinda   Medication Sig  albuterol (PROVENTIL HFA;VENTOLIN HFA) 108 (90 Base) MCG/ACT inhaler Inhale 2 puffs into the lungs every 6 (six) hours as needed for wheezing or shortness of breath.  albuterol (PROVENTIL) (2.5 MG/3ML) 0.083% nebulizer solution INHALE 1 VIAL VIA NEBULIZER EVERY 6 HOURS AS NEEDED FOR WHEEZING OR SHORTNESS OF BREATH  cetirizine (ZYRTEC) 10 MG tablet TAKE 1 Tablet BY MOUTH ONCE DAILY  diclofenac sodium (VOLTAREN) 1 % GEL Apply 2 g topically 4 (four) times daily as needed (pain).  DULoxetine (CYMBALTA) 60 MG capsule Take 1 capsule (60 mg total) by mouth daily.  furosemide (LASIX) 40 MG tablet Take 1 tablet (40 mg total)  by mouth daily.  gabapentin (NEURONTIN) 300 MG capsule Take 3 capsules (900 mg total) by mouth 3 (three) times daily.  hydrocortisone cream 1 % Apply 1 application topically 2 (two) times daily. Patient taking differently: Apply 1 application topically 2 (two) times daily as needed for itching.   mometasone-formoterol (DULERA) 200-5 MCG/ACT AERO Inhale 2 puffs into the lungs 2 (two) times daily.  montelukast (SINGULAIR) 10 MG tablet Take 1 tablet (10 mg total) by mouth at bedtime.  nicotine (NICOTROL) 10 MG inhaler Inhale 1 cartridge (puff for 20 minutes) as needed. Use at least 6 cartridges/day the first 3-6 weeks, reduce gradually over 12 weeks  pantoprazole (PROTONIX) 40 MG tablet Take 1 tablet (40 mg total) by mouth 2 (two) times daily.  tiotropium (SPIRIVA HANDIHALER) 18 MCG inhalation capsule Place 1 capsule (18 mcg total) into inhaler and inhale daily. IM program  traZODone (DESYREL) 100 MG tablet Take 1 tablet (100 mg total) by mouth at bedtime as needed for sleep.  warfarin (COUMADIN) 5 MG tablet Take 3 tablets on Sundays, Tuesdays, Thursdays and Saturdays once-daily at [facility]. On Mondays, Wednesdays and Fridays, take 2& 1/2 tablets.   Past Medical History:  Diagnosis Date  . Alcohol abuse   . Arthritis    knees  . Asthma    uses Albuterol daily as needed  . Bipolar 1 disorder ([place])   . Bronchitis    last time 2 yrs ago  .  Cellulitis   . Chronic dermatitis    due to Xarelto Rx  . Cough    smokers  . Depression   . DVT (deep venous thrombosis) ([facility])    on Coumadin Rx  . DVT (deep venous thrombosis) ([place])   . GERD (gastroesophageal reflux disease)    only takes something about 2 times a yr  . GERD (gastroesophageal reflux disease)   . Heart murmur   . Joint pain   . Pulmonary embolism ([place]) 2014  . Shortness of breath   . Substance abuse ([place])    crack, cocaine, last use 2007  . Tobacco abuse   . Tuberculosis    ' I test Positive "   Social History   Socioeconomic  History  . Marital status: Single    Spouse name: Not on file  . Number of children: Not on file  . Years of education: Not on file  . Highest education level: Not on file  Occupational History  . Occupation: Dentist: UNEMPLOYED  Social Needs  . Financial resource strain: Not on file  . Food insecurity:    Worry: Not on file    Inability: Not on file  . Transportation needs:    Medical: Not on file    Non-medical: Not on file  Tobacco Use  . Smoking status: Current Every Day Smoker    Packs/day: 0.50    Types: Cigarettes  . Smokeless tobacco: Former Systems developer  . Tobacco comment: 11/21/17: "0.5 PPD since age 44"  Substance and Sexual Activity  . Alcohol use: No  . Drug use: No    Types: Cocaine    Comment: last use 2016  . Sexual activity: Yes    Partners: Female  Lifestyle  . Physical activity:    Days per week: Not on file    Minutes per session: Not on file  . Stress: Not on file  Relationships  . Social connections:    Talks on phone: Not on file    Gets together: Not on file    Attends religious service: Not on file    Active member of club or organization: Not on file    Attends meetings of clubs or organizations: Not on file    Relationship status: Not on file  Other Topics Concern  . Not on file  Social History Narrative   Working at [employer] now unemployed.  States he has insurance through them now.    Lives with mom and step dad          Family History  Problem Relation Age of Onset  . Asthma Mother   . Throat cancer Father    ASSESSMENT Lab Results  Component Value Date   INR 2.2 06/11/2018   INR 2.3 05/14/2018   INR 1.1 (A) 04/30/2018   Anticoagulation Dosing: Description   Take 3 of your 5mg  peach-colored warfarin tablets by mouth, once-daily, at [facility] on Sundays, Tuesdays, Thursdays and Saturdays; On Mondays, Wednesdays and Fridays--take only 2 & 1/2 tablets of your 5mg  peach-colored warfarin tablets.      INR  today: Therapeutic  PLAN Weekly dose was unchanged   Patient Instructions  Patient educated about medication as defined in this encounter and verbalized understanding by repeating back instructions provided.    Patient advised to contact clinic or seek medical attention if signs/symptoms of bleeding or thromboembolism occur.  Patient verbalized understanding by repeating back information and was advised to contact me if further  medication-related questions arise. Patient was also provided an information handout.  Follow-up Return in about 6 weeks (around 07/22/2018).  Flossie Dibble  15 minutes spent face-to-face with the patient during the encounter. 50% of time spent on education. 50% of time was spent on assessment and plan.  Patient requested transfer of all respiratory meds to [facility] due to having insurance and no longer qualifying for [facility] [place] pharmacy.

## 2018-06-11 NOTE — Addendum Note (Signed)
Addended by: Forde Dandy on: 06/11/2018 11:19 AM   Modules accepted: Orders

## 2018-06-11 NOTE — Patient Instructions (Signed)
Patient educated about medication as defined in this encounter and verbalized understanding by repeating back instructions provided.   

## 2018-06-13 MED ORDER — BUDESONIDE-FORMOTEROL FUMARATE 160-4.5 MCG/ACT IN AERO: 2.0000 | INHALATION_SPRAY | Freq: Two times a day (BID) | RESPIRATORY_TRACT | 11 refills | Status: DC

## 2018-06-13 MED FILL — SYMBICORT 160-4.5 MCG INH: 160-4.5 | 30 days supply | Qty: 10 | Fill #0

## 2018-06-13 NOTE — Addendum Note (Signed)
Addended by: Forde Dandy on: 06/13/2018 02:14 PM   Modules accepted: Orders

## 2018-06-22 ENCOUNTER — Encounter (HOSPITAL_COMMUNITY): Payer: Self-pay

## 2018-06-22 ENCOUNTER — Emergency Department (HOSPITAL_COMMUNITY)
Admission: EM | Admit: 2018-06-22 | Discharge: 2018-06-22 | Disposition: A | Discharge status: Home / Self Care | Payer: Medicare Other | Attending: Emergency Medicine | Admitting: Emergency Medicine

## 2018-06-22 ENCOUNTER — Emergency Department (HOSPITAL_COMMUNITY): Payer: Medicare Other

## 2018-06-22 ENCOUNTER — Other Ambulatory Visit: Payer: Self-pay

## 2018-06-22 DIAGNOSIS — J45909 Unspecified asthma, uncomplicated: Secondary | ICD-10-CM | POA: Insufficient documentation

## 2018-06-22 DIAGNOSIS — M545 Low back pain, unspecified: Secondary | ICD-10-CM

## 2018-06-22 DIAGNOSIS — Z7901 Long term (current) use of anticoagulants: Secondary | ICD-10-CM | POA: Diagnosis not present

## 2018-06-22 DIAGNOSIS — F17210 Nicotine dependence, cigarettes, uncomplicated: Secondary | ICD-10-CM | POA: Diagnosis not present

## 2018-06-22 DIAGNOSIS — Z79899 Other long term (current) drug therapy: Secondary | ICD-10-CM | POA: Insufficient documentation

## 2018-06-22 DIAGNOSIS — I5032 Chronic diastolic (congestive) heart failure: Secondary | ICD-10-CM | POA: Insufficient documentation

## 2018-06-22 DIAGNOSIS — R109 Unspecified abdominal pain: Secondary | ICD-10-CM | POA: Insufficient documentation

## 2018-06-22 DIAGNOSIS — N200 Calculus of kidney: Secondary | ICD-10-CM | POA: Diagnosis not present

## 2018-06-22 LAB — URINALYSIS, ROUTINE W REFLEX MICROSCOPIC
Bilirubin Urine: NEGATIVE
GLUCOSE, UA: NEGATIVE mg/dL
Hgb urine dipstick: NEGATIVE
Ketones, ur: NEGATIVE mg/dL
LEUKOCYTES UA: NEGATIVE
NITRITE: NEGATIVE
Protein, ur: NEGATIVE mg/dL
Specific Gravity, Urine: 1.016 (ref 1.005–1.030)
pH: 5 (ref 5.0–8.0)

## 2018-06-22 MED ORDER — METHOCARBAMOL 750 MG PO TABS: 750.0000 mg | ORAL_TABLET | Freq: Three times a day (TID) | ORAL | 0 refills | Status: DC | PRN

## 2018-06-22 MED ORDER — HYDROCODONE-ACETAMINOPHEN 5-325 MG PO TABS: 1.0000 | ORAL_TABLET | Freq: Four times a day (QID) | ORAL | 0 refills | Status: DC | PRN

## 2018-06-22 MED ORDER — HYDROCODONE-ACETAMINOPHEN 5-325 MG PO TABS
2.0000 | ORAL_TABLET | Freq: Once | ORAL | Status: AC
  Administered 2018-06-22: 2 via ORAL
  Filled 2018-06-22: qty 2

## 2018-06-22 NOTE — ED Triage Notes (Signed)
Pt reports severe lower back pain X2 days. He denies injury. He also reports he has something stuck in his foot.

## 2018-06-22 NOTE — Discharge Instructions (Addendum)
It was our pleasure to provide your ER care today - we hope that you feel better.  Take robaxin as need for muscle pain/spasm. You may also take hydrocodone as need for pain. No driving for the next 6 hours or when taking hydrocodone. Also, do not take tylenol or acetaminophen containing medication when taking hydrocodone.  Follow up with primary care doctor in 1-2 weeks.   Return to ER if worse, new symptoms, fevers, numbness/weakness, other concern.  Your ct scan shows no acute process - a couple incidental findings were noted - you have a kidney stone in the right kidney, and you have a small umbilical hernia - follow up with your doctor.

## 2018-06-22 NOTE — ED Notes (Signed)
Pt declining to wait for foot xray.  Will d/c pt and let MD know.
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
## 2018-06-22 NOTE — ED Notes (Signed)
Discharge instructions and prescription discussed with Pt by Casey Burkitt.  Pt verbalized understanding. Pt stable and ambulatory.

## 2018-07-12 ENCOUNTER — Other Ambulatory Visit: Payer: Self-pay

## 2018-07-12 DIAGNOSIS — I82511 Chronic embolism and thrombosis of right femoral vein: Secondary | ICD-10-CM

## 2018-07-12 DIAGNOSIS — I2699 Other pulmonary embolism without acute cor pulmonale: Secondary | ICD-10-CM

## 2018-07-12 MED ORDER — WARFARIN SODIUM 5 MG PO TABS: ORAL_TABLET | ORAL | 5 refills | Status: DC

## 2018-07-12 NOTE — Telephone Encounter (Signed)
warfarin (COUMADIN) 5 MG tablet, REFILL REQUEST @ [facility] ON [place], [place].    Pt would like this med by today, please call pt back.

## 2018-07-12 NOTE — Telephone Encounter (Signed)
12.5 mg (5 mg x 2.5) every Mon, Wed, Fri; 15 mg (5 mg x 3) all other days

## 2018-07-23 ENCOUNTER — Ambulatory Visit: Payer: Self-pay

## 2018-07-24 ENCOUNTER — Ambulatory Visit (INDEPENDENT_AMBULATORY_CARE_PROVIDER_SITE_OTHER): Payer: Medicare Other | Admitting: Pharmacist

## 2018-07-24 DIAGNOSIS — I82501 Chronic embolism and thrombosis of unspecified deep veins of right lower extremity: Secondary | ICD-10-CM

## 2018-07-24 DIAGNOSIS — Z7901 Long term (current) use of anticoagulants: Secondary | ICD-10-CM

## 2018-07-24 DIAGNOSIS — Z5181 Encounter for therapeutic drug level monitoring: Secondary | ICD-10-CM | POA: Diagnosis not present

## 2018-07-24 DIAGNOSIS — K219 Gastro-esophageal reflux disease without esophagitis: Secondary | ICD-10-CM

## 2018-07-24 LAB — POCT INR: INR: 2.5 (ref 2.0–3.0)

## 2018-07-24 MED ORDER — PANTOPRAZOLE SODIUM 40 MG PO TBEC: 40.0000 mg | DELAYED_RELEASE_TABLET | Freq: Two times a day (BID) | ORAL | 3 refills | Status: DC

## 2018-07-24 NOTE — Addendum Note (Signed)
Addended by: Forde Dandy on: 07/24/2018 02:21 PM   Modules accepted: Orders

## 2018-07-24 NOTE — Patient Instructions (Signed)
Patient educated about medication as defined in this encounter and verbalized understanding by repeating back instructions provided.   

## 2018-07-24 NOTE — Progress Notes (Signed)
Anticoagulation Management [name] [name] a 53 y.o.malewho reports to the clinic for monitoring of warfarintreatment.   Indication:DVT, chronic DVT History of; Long term current use of anticoagulant.  Duration:indefinite Supervising physician:[facility]  Anticoagulation Clinic Visit History: Patientdoes notreport signs/symptoms of bleeding or thromboembolism  Other recent changes:Nodiet, medications, lifestylechanges.  Anticoagulation Episode Summary    Current INR goal:   2.0-3.0  TTR:   58.3 % (3.3 y)  Next INR check:   09/27/2018  INR from last check:   2.5 (07/24/2018)  Weekly max warfarin dose:     Target end date:     INR check location:     Preferred lab:     Send INR reminders to:   [facility]   Indications   Chronic deep vein thrombosis (DVT) of right lower extremity ([place]) [I82.501] Long-term (current) use of anticoagulants [Z79.01] DVT (Resolved) [I82.409]       Comments:         Anticoagulation Care Providers    Provider Role Specialty Phone number   Bartholomew Crews, MD  Internal Medicine [phone]     Allergies  Allergen Reactions  . Xarelto [Rivaroxaban] Dermatitis    Blisters skin  . Clindamycin/Lincomycin Dermatitis    Severe rash/dermatitis from November 2015 still present Jan 2016 from clinda   Medication Sig  albuterol (PROVENTIL HFA;VENTOLIN HFA) 108 (90 Base) MCG/ACT inhaler Inhale 2 puffs into the lungs every 6 (six) hours as needed for wheezing or shortness of breath.  albuterol (PROVENTIL) (2.5 MG/3ML) 0.083% nebulizer solution INHALE 1 VIAL VIA NEBULIZER EVERY 6 HOURS AS NEEDED FOR WHEEZING OR SHORTNESS OF BREATH  budesonide-formoterol (SYMBICORT) 160-4.5 MCG/ACT inhaler Inhale 2 puffs into the lungs 2 (two) times daily. Rinse mouth after use  cetirizine (ZYRTEC) 10 MG tablet TAKE 1 Tablet BY MOUTH ONCE DAILY  diclofenac sodium (VOLTAREN) 1 % GEL Apply 2 g topically 4 (four) times daily as needed (pain).  DULoxetine  (CYMBALTA) 60 MG capsule Take 1 capsule (60 mg total) by mouth daily.  furosemide (LASIX) 40 MG tablet Take 1 tablet (40 mg total) by mouth daily.  gabapentin (NEURONTIN) 300 MG capsule Take 3 capsules (900 mg total) by mouth 3 (three) times daily.  HYDROcodone-acetaminophen (NORCO/VICODIN) 5-325 MG tablet Take 1-2 tablets by mouth every 6 (six) hours as needed for moderate pain.  hydrocortisone cream 1 % Apply 1 application topically 2 (two) times daily. Patient taking differently: Apply 1 application topically 2 (two) times daily as needed for itching.   methocarbamol (ROBAXIN) 750 MG tablet Take 1 tablet (750 mg total) by mouth 3 (three) times daily as needed (muscle spasm/pain).  montelukast (SINGULAIR) 10 MG tablet Take 1 tablet (10 mg total) by mouth at bedtime.  nicotine (NICOTROL) 10 MG inhaler Inhale 1 cartridge (puff for 20 minutes) as needed. Use at least 6 cartridges/day the first 3-6 weeks, reduce gradually over 12 weeks  pantoprazole (PROTONIX) 40 MG tablet Take 1 tablet (40 mg total) by mouth 2 (two) times daily.  Tiotropium Bromide Monohydrate (SPIRIVA RESPIMAT) 2.5 MCG/ACT AERS Inhale 2 puffs into the lungs daily.  traZODone (DESYREL) 100 MG tablet Take 1 tablet (100 mg total) by mouth at bedtime as needed for sleep.  warfarin (COUMADIN) 5 MG tablet Take 3 tablets (15mg ) on Sundays, Tuesdays, Thursdays and Saturdays once-daily at [facility] On Mondays, Wednesdays and Fridays, take 2 & 1/2 tablets (12.5 mg).   Past Medical History:  Diagnosis Date  . Alcohol abuse   . Arthritis    knees  .  Asthma    uses Albuterol daily as needed  . Bipolar 1 disorder ([place])   . Bronchitis    last time 2 yrs ago  . Cellulitis   . Chronic dermatitis    due to Xarelto Rx  . Cough    smokers  . Depression   . DVT (deep venous thrombosis) ([facility])    on Coumadin Rx  . DVT (deep venous thrombosis) ([place])   . GERD (gastroesophageal reflux disease)    only takes something about 2 times a yr  . GERD  (gastroesophageal reflux disease)   . Heart murmur   . Joint pain   . Pulmonary embolism ([place]) 2014  . Shortness of breath   . Substance abuse ([place])    crack, cocaine, last use 2007  . Tobacco abuse   . Tuberculosis    ' I test Positive "   Social History   Socioeconomic History  . Marital status: Single    Spouse name: Not on file  . Number of children: Not on file  . Years of education: Not on file  . Highest education level: Not on file  Occupational History  . Occupation: Dentist: UNEMPLOYED  Social Needs  . Financial resource strain: Not on file  . Food insecurity:    Worry: Not on file    Inability: Not on file  . Transportation needs:    Medical: Not on file    Non-medical: Not on file  Tobacco Use  . Smoking status: Current Every Day Smoker    Packs/day: 0.50    Types: Cigarettes  . Smokeless tobacco: Former Systems developer  . Tobacco comment: 11/21/17: "0.5 PPD since age 26"  Substance and Sexual Activity  . Alcohol use: No  . Drug use: No    Types: Cocaine    Comment: last use 2016  . Sexual activity: Yes    Partners: Female  Lifestyle  . Physical activity:    Days per week: Not on file    Minutes per session: Not on file  . Stress: Not on file  Relationships  . Social connections:    Talks on phone: Not on file    Gets together: Not on file    Attends religious service: Not on file    Active member of club or organization: Not on file    Attends meetings of clubs or organizations: Not on file    Relationship status: Not on file  Other Topics Concern  . Not on file  Social History Narrative   Working at [employer] now unemployed.  States he has insurance through them now.    Lives with mom and step dad          Family History  Problem Relation Age of Onset  . Asthma Mother   . Throat cancer Father    ASSESSMENT Lab Results  Component Value Date   INR 2.5 07/24/2018   INR 2.2 06/11/2018   INR 2.3 05/14/2018     Anticoagulation Dosing: Description   Take 3 of your 5mg  peach-colored warfarin tablets by mouth, once-daily, at [facility] on Sundays, Tuesdays, Thursdays and Saturdays; On Mondays, Wednesdays and Fridays--take only 2 & 1/2 tablets of your 5mg  peach-colored warfarin tablets.      INR today: Therapeutic  PLAN Weekly dose was unchanged  Patient Instructions  Patient educated about medication as defined in this encounter and verbalized understanding by repeating back instructions provided.    Patient advised to contact  clinic or seek medical attention if signs/symptoms of bleeding or thromboembolism occur.  Patient verbalized understanding by repeating back information and was advised to contact me if further medication-related questions arise. Patient was also provided an information handout.  Follow-up Return in about 4 weeks (around 08/21/2018).  Flossie Dibble

## 2018-07-27 ENCOUNTER — Other Ambulatory Visit: Payer: Self-pay

## 2018-07-27 ENCOUNTER — Emergency Department (HOSPITAL_COMMUNITY): Payer: Medicare Other

## 2018-07-27 ENCOUNTER — Emergency Department (HOSPITAL_COMMUNITY)
Admission: EM | Admit: 2018-07-27 | Discharge: 2018-07-27 | Disposition: A | Discharge status: Home / Self Care | Payer: Medicare Other | Attending: Emergency Medicine | Admitting: Emergency Medicine

## 2018-07-27 ENCOUNTER — Encounter (HOSPITAL_COMMUNITY): Payer: Self-pay

## 2018-07-27 DIAGNOSIS — Z7901 Long term (current) use of anticoagulants: Secondary | ICD-10-CM | POA: Insufficient documentation

## 2018-07-27 DIAGNOSIS — R748 Abnormal levels of other serum enzymes: Secondary | ICD-10-CM | POA: Insufficient documentation

## 2018-07-27 DIAGNOSIS — J181 Lobar pneumonia, unspecified organism: Secondary | ICD-10-CM

## 2018-07-27 DIAGNOSIS — J189 Pneumonia, unspecified organism: Secondary | ICD-10-CM | POA: Insufficient documentation

## 2018-07-27 DIAGNOSIS — R10812 Left upper quadrant abdominal tenderness: Secondary | ICD-10-CM | POA: Insufficient documentation

## 2018-07-27 DIAGNOSIS — Z79899 Other long term (current) drug therapy: Secondary | ICD-10-CM | POA: Diagnosis not present

## 2018-07-27 DIAGNOSIS — F17210 Nicotine dependence, cigarettes, uncomplicated: Secondary | ICD-10-CM | POA: Diagnosis not present

## 2018-07-27 DIAGNOSIS — N281 Cyst of kidney, acquired: Secondary | ICD-10-CM | POA: Diagnosis not present

## 2018-07-27 DIAGNOSIS — Z86711 Personal history of pulmonary embolism: Secondary | ICD-10-CM | POA: Insufficient documentation

## 2018-07-27 DIAGNOSIS — Z86718 Personal history of other venous thrombosis and embolism: Secondary | ICD-10-CM | POA: Insufficient documentation

## 2018-07-27 DIAGNOSIS — R0789 Other chest pain: Secondary | ICD-10-CM | POA: Diagnosis not present

## 2018-07-27 DIAGNOSIS — J168 Pneumonia due to other specified infectious organisms: Secondary | ICD-10-CM | POA: Diagnosis not present

## 2018-07-27 DIAGNOSIS — R918 Other nonspecific abnormal finding of lung field: Secondary | ICD-10-CM | POA: Diagnosis not present

## 2018-07-27 DIAGNOSIS — R10816 Epigastric abdominal tenderness: Secondary | ICD-10-CM | POA: Diagnosis not present

## 2018-07-27 DIAGNOSIS — R079 Chest pain, unspecified: Secondary | ICD-10-CM | POA: Diagnosis not present

## 2018-07-27 LAB — CBC WITH DIFFERENTIAL/PLATELET
Abs Immature Granulocytes: 0.03 10*3/uL (ref 0.00–0.07)
Basophils Absolute: 0 10*3/uL (ref 0.0–0.1)
Basophils Relative: 0 %
EOS PCT: 1 %
Eosinophils Absolute: 0.1 10*3/uL (ref 0.0–0.5)
HCT: 50.3 % (ref 39.0–52.0)
Hemoglobin: 16.2 g/dL (ref 13.0–17.0)
Immature Granulocytes: 0 %
Lymphocytes Relative: 18 %
Lymphs Abs: 1.7 10*3/uL (ref 0.7–4.0)
MCH: 30.2 pg (ref 26.0–34.0)
MCHC: 32.2 g/dL (ref 30.0–36.0)
MCV: 93.8 fL (ref 80.0–100.0)
Monocytes Absolute: 0.6 10*3/uL (ref 0.1–1.0)
Monocytes Relative: 7 %
Neutro Abs: 6.5 10*3/uL (ref 1.7–7.7)
Neutrophils Relative %: 74 %
Platelets: 229 10*3/uL (ref 150–400)
RBC: 5.36 MIL/uL (ref 4.22–5.81)
RDW: 13.5 % (ref 11.5–15.5)
WBC: 9 10*3/uL (ref 4.0–10.5)
nRBC: 0 % (ref 0.0–0.2)

## 2018-07-27 LAB — COMPREHENSIVE METABOLIC PANEL
ALK PHOS: 69 U/L (ref 38–126)
ALT: 24 U/L (ref 0–44)
AST: 25 U/L (ref 15–41)
Albumin: 3.9 g/dL (ref 3.5–5.0)
Anion gap: 10 (ref 5–15)
BUN: 9 mg/dL (ref 6–20)
CO2: 24 mmol/L (ref 22–32)
Calcium: 9.3 mg/dL (ref 8.9–10.3)
Chloride: 104 mmol/L (ref 98–111)
Creatinine, Ser: 1.11 mg/dL (ref 0.61–1.24)
GFR calc Af Amer: >60 mL/min (ref >60)
GFR calc non Af Amer: >60 mL/min (ref >60)
Glucose, Bld: 103 mg/dL — ABNORMAL HIGH (ref 70–99)
Potassium: 5 mmol/L (ref 3.5–5.1)
Sodium: 138 mmol/L (ref 135–145)
Total Bilirubin: 1.2 mg/dL (ref 0.3–1.2)
Total Protein: 7.2 g/dL (ref 6.5–8.1)

## 2018-07-27 LAB — LIPASE, BLOOD: LIPASE: 97 U/L — AB (ref 11–51)

## 2018-07-27 LAB — PROTIME-INR
INR: 1.77
Prothrombin Time: 20.4 seconds — ABNORMAL HIGH (ref 11.4–15.2)

## 2018-07-27 LAB — I-STAT TROPONIN, ED: Troponin i, poc: 0 ng/mL (ref 0.00–0.08)

## 2018-07-27 MED ORDER — LEVOFLOXACIN 750 MG PO TABS
750.0000 mg | ORAL_TABLET | Freq: Once | ORAL | Status: AC
  Administered 2018-07-27: 750 mg via ORAL
  Filled 2018-07-27: qty 1

## 2018-07-27 MED ORDER — DOXYCYCLINE HYCLATE 100 MG PO CAPS: 100.0000 mg | ORAL_CAPSULE | Freq: Two times a day (BID) | ORAL | 0 refills | Status: AC

## 2018-07-27 MED ORDER — HYDROCODONE-ACETAMINOPHEN 5-325 MG PO TABS: 1.0000 | ORAL_TABLET | Freq: Four times a day (QID) | ORAL | 0 refills | Status: DC | PRN

## 2018-07-27 MED ORDER — HYDROCODONE-ACETAMINOPHEN 5-325 MG PO TABS
1.0000 | ORAL_TABLET | Freq: Once | ORAL | Status: AC
  Administered 2018-07-27: 1 via ORAL
  Filled 2018-07-27: qty 1

## 2018-07-27 MED ORDER — ONDANSETRON HCL 4 MG/2ML IJ SOLN
4.0000 mg | Freq: Once | INTRAMUSCULAR | Status: AC
  Administered 2018-07-27: 4 mg via INTRAVENOUS
  Filled 2018-07-27: qty 2

## 2018-07-27 MED ORDER — IOPAMIDOL (ISOVUE-370) INJECTION 76%
INTRAVENOUS | Status: AC
  Administered 2018-07-27: 100 mL
  Filled 2018-07-27: qty 100

## 2018-07-27 NOTE — ED Notes (Signed)
Patient verbalizes understanding of discharge instructions. Opportunity for questioning and answers were provided. Armband removed by staff, pt discharged from ED.  

## 2018-07-27 NOTE — Discharge Instructions (Addendum)
Your antibiotic is called doxycycline.  Please make sure that you avoid the sun while taking this.  Please make sure that you are taking your warfarin, as today your INR was low.  As we discussed today your lipase is slightly elevated.  This indicates a level of inflammation of your pancreas.  Usually to diagnose you with pancreatitis that has to be over 150 which years is not.  Please follow clear liquid diet.  Your INR was low/subtherapeutic, please follow-up with your clinic.  Your chest x-ray and CT scan showed pneumonia.  We discussed the risks of Levaquin which is an antibiotic for this.  Please follow-up with your primary care doctor.  Today you received medications that may make you sleepy or impair your ability to make decisions.  For the next 24 hours please do not drive, operate heavy machinery, care for a small child with out another adult present, or perform any activities that may cause harm to you or someone else if you were to fall asleep or be impaired.   You are being prescribed a medication which may make you sleepy. Please follow up of listed precautions for at least 24 hours after taking one dose.  You may have diarrhea from the antibiotics.  It is very important that you continue to take the antibiotics even if you get diarrhea unless a medical professional tells you that you may stop taking them.  If you stop too early the bacteria you are being treated for will become stronger and you may need different, more powerful antibiotics that have more side effects and worsening diarrhea.  Please stay well hydrated and consider probiotics as they may decrease the severity of your diarrhea.
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
## 2018-07-27 NOTE — ED Triage Notes (Signed)
Pt presents for left sided abdominal pain that radiates in to his left chest and left arm x4 days. Pt states pain significantly worsens immediately after eating. Pt states he has not tried anything to alleviate the pain. Pt endorses mild SOB, states has hx of COPD and asthma and attributes it to his hx. Denies NVD. Pt A+Ox4, in NAD on arrival, skin warm and dry.

## 2018-07-30 ENCOUNTER — Ambulatory Visit (INDEPENDENT_AMBULATORY_CARE_PROVIDER_SITE_OTHER): Payer: Medicare Other | Admitting: Internal Medicine

## 2018-07-30 ENCOUNTER — Other Ambulatory Visit: Payer: Self-pay

## 2018-07-30 DIAGNOSIS — M549 Dorsalgia, unspecified: Secondary | ICD-10-CM

## 2018-07-30 DIAGNOSIS — J9811 Atelectasis: Secondary | ICD-10-CM

## 2018-07-30 DIAGNOSIS — Z8701 Personal history of pneumonia (recurrent): Secondary | ICD-10-CM

## 2018-07-30 DIAGNOSIS — R0789 Other chest pain: Secondary | ICD-10-CM

## 2018-07-30 DIAGNOSIS — F17210 Nicotine dependence, cigarettes, uncomplicated: Secondary | ICD-10-CM | POA: Diagnosis not present

## 2018-07-30 NOTE — Progress Notes (Signed)
   CC: ER follow-up of pneumonia   HPI:Mr.Levoy A Hulbert is a 53 y.o. male who presents for evaluation of pneumonia with associated chest pain. Please see individual problem based A/P for details.  PHQ-9: Based on the patients score of 6 we have decided to monitor.  Past Medical History:  Diagnosis Date  . Alcohol abuse   . Arthritis    knees  . Asthma    uses Albuterol daily as needed  . Bipolar 1 disorder ([place])   . Bronchitis    last time 2 yrs ago  . Cellulitis   . Chronic dermatitis    due to Xarelto Rx  . Cough    smokers  . Depression   . DVT (deep venous thrombosis) ([facility])    on Coumadin Rx  . DVT (deep venous thrombosis) ([place])   . GERD (gastroesophageal reflux disease)    only takes something about 2 times a yr  . GERD (gastroesophageal reflux disease)   . Heart murmur   . Joint pain   . Pulmonary embolism ([place]) 2014  . Shortness of breath   . Substance abuse ([place])    crack, cocaine, last use 2007  . Tobacco abuse   . Tuberculosis    ' I test Positive "   Review of Systems:  ROS negative except as per HPI.  Physical Exam: Vitals:   07/30/18 1513  BP: 131/89  Pulse: 68  Temp: 97.8 F (36.6 C)  TempSrc: Oral  SpO2: 99%  Weight: 261 lb 3.2 oz (118.5 kg)  Height: 6\' 4"  (1.93 m)   General: A/O x4, in no acute distress, afebrile, nondiaphoretic Cardio: RRR, no mrg's Pulmonary: CTA bilaterally MSK: BLE nontender, nonedematous  Assessment & Plan:   See Encounters Tab for problem based charting.  Patient discussed with Dr. Lynnae January

## 2018-07-30 NOTE — Assessment & Plan Note (Signed)
  Chest wall pain: The patient stated that he presented to the emergency department on 07/27/2018 for chest wall pain of moderate intensity.  The patient endorsed mild associated dyspnea and slight malaise but denied fever, chills, myalgias, cough, vomiting, increased sputum production or diarrhea.  CT NGO as well as a CT with contrast of the abdomen and pelvis were all performed as chest wall pain was not well characterized on exam. Today the patient endorses associated bloating, early prandial fullness, and moderate mid epigastric pain with meals in addition to the chest wall pain.  Chest wall pain is reproducible and is located at the base of the ribs anteriorly just below the nipple. Posteriorly at the base of the ribs approximately 5 inches below the left scapula and midline. The pain is present on palpation. This pain is somewhat distinguishable from the primary pain.  Review of the CT's reveals the noted pulmonary infiltrate on the right with mild atelectasis on the left.  Given his mild symptoms for pneumonia, the meal time variation as well as the reproducible nature of the pain on palpation I feel there may be multiple processes at play.   Plan: We will complete his current course of antibiotics as prescribed by the ER.  If his symptoms persist, he is to follow-up on January 31st for consideration of H. Pylori stool testing given his symptoms. (denied prior history of such or gastric ulcers) If he worsens I have advised him to return sooner. Voltaren gel was recommended Prn for the Chest wall pain. He has this available at home.

## 2018-07-30 NOTE — Patient Instructions (Addendum)
FOLLOW-UP INSTRUCTIONS When: By January 31st. If your symptmos resolve you may cancel the appointment, If they worsen, please schedule sooner.  I have not made any changes to your medications today.   Today we discussed your lower chest wall and back pain. This is in a nonspecific manner. I would continue to monitor this and let us know if it worsens.  Please complete the antibiotic course and let us know if you have any questions.    As always if your symptoms worsen, fail to improve, or you develop other concerning symptoms, please notify our office or visit the local ER if we are unavailable. Symptoms including fever, weakness, lightheadedness, blood in your stool or dark tarry stool, should not be ignored and should encourage you to visit the ED if we are unavailable by phone or the symptoms are severe.  Thank you for your visit to the [facility] today. If you have any questions or concerns please call us at [phone].

## 2018-08-01 NOTE — Progress Notes (Signed)
Internal Medicine Clinic Attending  Case discussed with Dr. Harbrecht at the time of the visit.  We reviewed the resident's history and exam and pertinent patient test results.  I agree with the assessment, diagnosis, and plan of care documented in the resident's note.   

## 2018-08-09 ENCOUNTER — Ambulatory Visit: Payer: Self-pay

## 2018-08-09 ENCOUNTER — Encounter: Payer: Self-pay | Admitting: Internal Medicine

## 2018-08-16 MED FILL — PANTOPRAZOLE SOD DR 40 MG T: 40 | 90 days supply | Qty: 180 | Fill #0

## 2018-08-16 MED FILL — WARFARIN SODIUM 5 MG TABLET: 5 | 30 days supply | Qty: 80 | Fill #1

## 2018-08-21 ENCOUNTER — Ambulatory Visit (INDEPENDENT_AMBULATORY_CARE_PROVIDER_SITE_OTHER): Payer: Medicare Other | Admitting: Pharmacist

## 2018-08-21 DIAGNOSIS — Z5181 Encounter for therapeutic drug level monitoring: Secondary | ICD-10-CM

## 2018-08-21 DIAGNOSIS — Z7901 Long term (current) use of anticoagulants: Secondary | ICD-10-CM | POA: Diagnosis not present

## 2018-08-21 DIAGNOSIS — I82501 Chronic embolism and thrombosis of unspecified deep veins of right lower extremity: Secondary | ICD-10-CM

## 2018-08-21 LAB — POCT INR: INR: 4.4 — AB (ref 2.0–3.0)

## 2018-08-21 NOTE — Progress Notes (Signed)
INTERNAL MEDICINE TEACHING ATTENDING ADDENDUM ° °I agree with pharmacy recommendations as outlined in their note.  ° °-Duncan Vincent MD ° °

## 2018-08-21 NOTE — Patient Instructions (Signed)
Patient educated about medication as defined in this encounter and verbalized understanding by repeating back instructions provided.   

## 2018-08-21 NOTE — Progress Notes (Signed)
Anticoagulation Management [name] [name] is a 53 y.o. male who reports to the clinic for monitoring of warfarin treatment.    Indication: DVT and chronic DVT History of; Long term use of anticoagulation.  Duration: indefinite Supervising physician: Gilles Chiquito  Anticoagulation Clinic Visit History: Patient does not report signs/symptoms of bleeding or thromboembolism. Other recent changes: No diet, medications, or lifestyle changes at this time. Anticoagulation Episode Summary    Current INR goal:   2.0-3.0  TTR:   58.4 % (3.3 y)  Next INR check:   09/27/2018  INR from last check:   2.5 (07/24/2018)  Most recent INR:    1.77! (07/27/2018)  Weekly max warfarin dose:     Target end date:     INR check location:     Preferred lab:     Send INR reminders to:   [facility]   Indications   Chronic deep vein thrombosis (DVT) of right lower extremity ([place]) [I82.501] Long-term (current) use of anticoagulants [Z79.01] DVT (Resolved) [I82.409]       Comments:         Anticoagulation Care Providers    Provider Role Specialty Phone number   Bartholomew Crews, MD  Internal Medicine [phone]      Allergies  Allergen Reactions  . Rivaroxaban Dermatitis    Blisters skin Blisters skin   . Clindamycin/Lincomycin Dermatitis    Severe rash/dermatitis from November 2015 still present Jan 2016 from clinda  . Clindamycin Other (See Comments)   Prior to Admission medications   Medication Sig Start Date End Date Taking? Authorizing Provider  albuterol (PROVENTIL HFA;VENTOLIN HFA) 108 (90 Base) MCG/ACT inhaler Inhale 2 puffs into the lungs every 6 (six) hours as needed for wheezing or shortness of breath. 06/11/18   [name], MD  albuterol (PROVENTIL) (2.5 MG/3ML) 0.083% nebulizer solution INHALE 1 VIAL VIA NEBULIZER EVERY 6 HOURS AS NEEDED FOR WHEEZING OR SHORTNESS OF BREATH 12/28/17   Thomasene Ripple, MD  budesonide-formoterol (SYMBICORT) 160-4.5 MCG/ACT inhaler Inhale 2  puffs into the lungs 2 (two) times daily. Rinse mouth after use 06/13/18   Forde Dandy, PharmD  cetirizine (ZYRTEC) 10 MG tablet TAKE 1 Tablet BY MOUTH ONCE DAILY 12/28/17   Thomasene Ripple, MD  diclofenac sodium (VOLTAREN) 1 % GEL Apply 2 g topically 4 (four) times daily as needed (pain). 11/26/17   Rice, Resa Miner, MD  DULoxetine (CYMBALTA) 60 MG capsule Take 1 capsule (60 mg total) by mouth daily. 02/25/18   Forde Dandy, PharmD  furosemide (LASIX) 40 MG tablet Take 1 tablet (40 mg total) by mouth daily. 09/03/17   Thomasene Ripple, MD  gabapentin (NEURONTIN) 300 MG capsule Take 3 capsules (900 mg total) by mouth 3 (three) times daily. 09/03/17   Thomasene Ripple, MD  HYDROcodone-acetaminophen (NORCO/VICODIN) 5-325 MG tablet Take 1 tablet by mouth every 6 (six) hours as needed for severe pain. 07/27/18   Lorin Glass, PA-C  hydrocortisone cream 1 % Apply 1 application topically 2 (two) times daily. Patient taking differently: Apply 1 application topically 2 (two) times daily as needed for itching.  09/03/17   Thomasene Ripple, MD  methocarbamol (ROBAXIN) 750 MG tablet Take 1 tablet (750 mg total) by mouth 3 (three) times daily as needed (muscle spasm/pain). Patient not taking: Reported on 07/27/2018 06/22/18   Lajean Saver, MD  montelukast (SINGULAIR) 10 MG tablet Take 1 tablet (10 mg total) by mouth at bedtime. 06/11/18   [name], MD  nicotine (NICOTROL) 10  MG inhaler Inhale 1 cartridge (puff for 20 minutes) as needed. Use at least 6 cartridges/day the first 3-6 weeks, reduce gradually over 12 weeks 07/14/16   Lucious Groves, DO  pantoprazole (PROTONIX) 40 MG tablet Take 1 tablet (40 mg total) by mouth 2 (two) times daily. 07/24/18   Forde Dandy, PharmD  Tiotropium Bromide Monohydrate (SPIRIVA RESPIMAT) 2.5 MCG/ACT AERS Inhale 2 puffs into the lungs daily. 06/11/18   [name], MD  traZODone (DESYREL) 100 MG tablet Take 1 tablet (100 mg total) by mouth at bedtime as  needed for sleep. 05/20/18   [name], MD  warfarin (COUMADIN) 5 MG tablet Take 3 tablets (15mg ) on Sundays, Tuesdays, Thursdays and Saturdays once-daily at [facility]. On Mondays, Wednesdays and Fridays, take 2 & 1/2 tablets (12.5 mg). 07/12/18   Bartholomew Crews, MD   Past Medical History:  Diagnosis Date  . Alcohol abuse   . Arthritis    knees  . Asthma    uses Albuterol daily as needed  . Bipolar 1 disorder ([place])   . Bronchitis    last time 2 yrs ago  . Cellulitis   . Chronic dermatitis    due to Xarelto Rx  . Cough    smokers  . Depression   . DVT (deep venous thrombosis) ([facility])    on Coumadin Rx  . DVT (deep venous thrombosis) ([place])   . GERD (gastroesophageal reflux disease)    only takes something about 2 times a yr  . GERD (gastroesophageal reflux disease)   . Heart murmur   . Joint pain   . Pulmonary embolism ([place]) 2014  . Shortness of breath   . Substance abuse ([place])    crack, cocaine, last use 2007  . Tobacco abuse   . Tuberculosis    ' I test Positive "   Social History   Socioeconomic History  . Marital status: Single    Spouse name: Not on file  . Number of children: Not on file  . Years of education: Not on file  . Highest education level: Not on file  Occupational History  . Occupation: Dentist: UNEMPLOYED  Social Needs  . Financial resource strain: Not on file  . Food insecurity:    Worry: Not on file    Inability: Not on file  . Transportation needs:    Medical: Not on file    Non-medical: Not on file  Tobacco Use  . Smoking status: Current Every Day Smoker    Packs/day: 0.50    Types: Cigarettes  . Smokeless tobacco: Former Systems developer  . Tobacco comment: 11/21/17: "0.5 PPD since age 36, none for 2 days"  Substance and Sexual Activity  . Alcohol use: No  . Drug use: No    Types: Cocaine    Comment: last use 2016  . Sexual activity: Yes    Partners: Female  Lifestyle  . Physical activity:    Days per week: Not on file     Minutes per session: Not on file  . Stress: Not on file  Relationships  . Social connections:    Talks on phone: Not on file    Gets together: Not on file    Attends religious service: Not on file    Active member of club or organization: Not on file    Attends meetings of clubs or organizations: Not on file    Relationship status: Not on file  Other Topics Concern  .  Not on file  Social History Narrative   Working at [employer] now unemployed.  States he has insurance through them now.    Lives with mom and step dad          Family History  Problem Relation Age of Onset  . Asthma Mother   . Throat cancer Father     ASSESSMENT Recent Results: The most recent result is correlated with 97.5 mg per week: Lab Results  Component Value Date   INR 1.77 07/27/2018   INR 2.5 07/24/2018   INR 2.2 06/11/2018    Anticoagulation Dosing: Description   Take 3 of your 5mg  peach-colored warfarin tablets by mouth, once-daily, at [facility] on Sundays, Tuesdays, Thursdays and Saturdays; On Mondays, Wednesdays and Fridays--take only 2 & 1/2 tablets of your 5mg  peach-colored warfarin tablets.      INR today: Therapeutic  PLAN Weekly dose was unchanged.  Patient Instructions Patient advised to contact clinic or seek medical attention if signs/symptoms of bleeding or thromboembolism occur.  Patient verbalized understanding by repeating back information and was advised to contact me if further medication-related questions arise. Patient was also provided an information handout.  Follow-up Return in 6 weeks for INR check (3/25 @ 10:30am)  Adella Hare, PharmD Candidate  15 minutes spent face-to-face with the patient during the encounter. 50% of time spent on education. 50% of time was spent on fingerstick point of care INR sample collection, processing, results determination, and documentation in [email or web address]

## 2018-08-26 MED FILL — SPIRIVA RESPIMAT 2.5 MCG IN: 2.5 | 30 days supply | Qty: 4 | Fill #1

## 2018-08-26 MED FILL — DULoxetine HCL 60 MG CPEP: 60 | 90 days supply | Qty: 90 | Fill #1

## 2018-08-26 MED FILL — MONTELUKAST SOD 10 MG TAB: 10 | 30 days supply | Qty: 30 | Fill #1

## 2018-09-23 ENCOUNTER — Telehealth: Payer: Self-pay | Admitting: Internal Medicine

## 2018-09-23 NOTE — Telephone Encounter (Signed)
Pt is returning a call regarding an INR machine at home, pls return call [phone]

## 2018-09-25 ENCOUNTER — Encounter: Payer: Self-pay | Admitting: Pharmacist

## 2018-09-25 ENCOUNTER — Other Ambulatory Visit: Payer: Self-pay | Admitting: Pharmacist

## 2018-09-25 DIAGNOSIS — I2699 Other pulmonary embolism without acute cor pulmonale: Secondary | ICD-10-CM

## 2018-09-25 DIAGNOSIS — Z7901 Long term (current) use of anticoagulants: Secondary | ICD-10-CM

## 2018-09-25 DIAGNOSIS — I82501 Chronic embolism and thrombosis of unspecified deep veins of right lower extremity: Secondary | ICD-10-CM

## 2018-09-25 MED FILL — WARFARIN SODIUM 5 MG TABLET: 5 | 30 days supply | Qty: 80 | Fill #2

## 2018-09-27 ENCOUNTER — Encounter: Payer: Self-pay | Admitting: Internal Medicine

## 2018-09-27 ENCOUNTER — Other Ambulatory Visit: Payer: Self-pay

## 2018-10-02 ENCOUNTER — Ambulatory Visit: Payer: Self-pay | Admitting: Pharmacist

## 2018-10-07 ENCOUNTER — Other Ambulatory Visit: Payer: Self-pay | Admitting: Pharmacist

## 2018-10-07 DIAGNOSIS — G4700 Insomnia, unspecified: Secondary | ICD-10-CM

## 2018-10-07 DIAGNOSIS — J4540 Moderate persistent asthma, uncomplicated: Secondary | ICD-10-CM

## 2018-10-09 MED ORDER — TRAZODONE HCL 100 MG PO TABS: 100.0000 mg | ORAL_TABLET | Freq: Every evening | ORAL | 3 refills | Status: AC | PRN

## 2018-10-09 MED ORDER — CETIRIZINE HCL 10 MG PO TABS: 10.0000 mg | ORAL_TABLET | Freq: Every day | ORAL | 2 refills | Status: AC

## 2018-10-09 MED ORDER — MONTELUKAST SODIUM 10 MG PO TABS: 10.0000 mg | ORAL_TABLET | Freq: Every day | ORAL | 11 refills | Status: DC

## 2018-10-09 NOTE — Telephone Encounter (Signed)
cetirizine (ZYRTEC) 10 MG tablet,  montelukast (SINGULAIR) 10 MG tablet   traZODone (DESYREL) 100 MG tablet   Refill request @  [id number] - [address], [place] AT [address] [phone] (Phone) [phone] (Fax)

## 2018-10-29 ENCOUNTER — Telehealth: Payer: Self-pay | Admitting: Pharmacist

## 2018-10-29 DIAGNOSIS — I2699 Other pulmonary embolism without acute cor pulmonale: Secondary | ICD-10-CM

## 2018-10-29 DIAGNOSIS — I82511 Chronic embolism and thrombosis of right femoral vein: Secondary | ICD-10-CM

## 2018-10-29 MED ORDER — WARFARIN SODIUM 5 MG PO TABS: ORAL_TABLET | ORAL | 5 refills | Status: DC

## 2018-10-29 NOTE — Progress Notes (Signed)
Patient called to notify clinic he has not been successful checking his INR but was informed he will be receiving his home INR meter on either 11/01/18 or 11/04/18 in the mail through [facility] and will contact clinic with results. Will also plan to follow up with patient for INR results.

## 2018-11-04 ENCOUNTER — Telehealth (INDEPENDENT_AMBULATORY_CARE_PROVIDER_SITE_OTHER): Payer: Medicare Other | Admitting: Pharmacist

## 2018-11-04 DIAGNOSIS — Z86718 Personal history of other venous thrombosis and embolism: Secondary | ICD-10-CM

## 2018-11-04 DIAGNOSIS — Z7901 Long term (current) use of anticoagulants: Secondary | ICD-10-CM | POA: Diagnosis not present

## 2018-11-04 DIAGNOSIS — I82511 Chronic embolism and thrombosis of right femoral vein: Secondary | ICD-10-CM

## 2018-11-04 DIAGNOSIS — Z5181 Encounter for therapeutic drug level monitoring: Secondary | ICD-10-CM

## 2018-11-04 LAB — POCT INR: INR: 2.3 (ref 2.0–3.0)

## 2018-11-05 ENCOUNTER — Telehealth: Payer: Self-pay | Admitting: Pharmacist

## 2018-11-05 ENCOUNTER — Telehealth: Payer: Self-pay | Admitting: *Deleted

## 2018-11-05 NOTE — Telephone Encounter (Signed)
Spoke with patient. He was called by Dr. Maudie Mercury yesterday working remotely from home during [place].

## 2018-11-05 NOTE — Telephone Encounter (Signed)
Received fax from [facility] - INR 2.3 done on 11/04/18.

## 2018-11-05 NOTE — Telephone Encounter (Signed)
Patient was called yesterday by Dr. Maudie Mercury and advised to continue same warfarin regimen and perform PST FS PT/INRs as instructed.

## 2018-11-05 NOTE — Progress Notes (Signed)
Anticoagulation Management [place] [name] [name] is [name] 53 y.o. male who reports to the clinic for monitoring of warfarin treatment.    Indication: DVT and chronic DVT History of; Long term use of anticoagulation.  Duration: indefinite Supervising physician: [place] Clinic Visit History: Patient does not report signs/symptoms of bleeding or thromboembolism. Other recent changes: No diet, medications, or lifestyle changes at this time.  Anticoagulation Episode Summary    Current INR goal:   2.0-3.0  TTR:   56.5 % (3.6 y)  Next INR check:   11/19/2018  INR from last check:   2.3 (11/04/2018)  Weekly max warfarin dose:     Target end date:     INR check location:     Preferred lab:     Send INR reminders to:   [facility]   Indications   Chronic deep vein thrombosis (DVT) of right lower extremity ([place]) [I82.501] Long-term (current) use of anticoagulants [Z79.01] DVT (Resolved) [I82.409]       Comments:         Anticoagulation Care Providers    Provider Role Specialty Phone number   Bartholomew Crews, MD  Internal Medicine [phone]     Allergies  Allergen Reactions  . Rivaroxaban Dermatitis    Blisters skin Blisters skin   . Clindamycin/Lincomycin Dermatitis    Severe rash/dermatitis from November 2015 still present Jan 2016 from clinda  . Clindamycin Other (See Comments)   Prior to Admission medications   Medication Sig      albuterol (PROVENTIL HFA;VENTOLIN HFA) 108 (90 Base) MCG/ACT inhaler Inhale 2 puffs into the lungs every 6 (six) hours as needed for wheezing or shortness of breath.      albuterol (PROVENTIL) (2.5 MG/3ML) 0.083% nebulizer solution INHALE 1 VIAL VIA NEBULIZER EVERY 6 HOURS AS NEEDED FOR WHEEZING OR SHORTNESS OF BREATH      budesonide-formoterol (SYMBICORT) 160-4.5 MCG/ACT inhaler Inhale 2 puffs into the lungs 2 (two) times daily. Rinse mouth after use      cetirizine (ZYRTEC) 10 MG tablet Take 1 tablet (10 mg total) by mouth  daily.      diclofenac sodium (VOLTAREN) 1 % GEL Apply 2 g topically 4 (four) times daily as needed (pain).      DULoxetine (CYMBALTA) 60 MG capsule Take 1 capsule (60 mg total) by mouth daily.      furosemide (LASIX) 40 MG tablet Take 1 tablet (40 mg total) by mouth daily.      gabapentin (NEURONTIN) 300 MG capsule Take 3 capsules (900 mg total) by mouth 3 (three) times daily.      HYDROcodone-acetaminophen (NORCO/VICODIN) 5-325 MG tablet Take 1 tablet by mouth every 6 (six) hours as needed for severe pain.      hydrocortisone cream 1 % Apply 1 application topically 2 (two) times daily. Patient taking differently: Apply 1 application topically 2 (two) times daily as needed for itching.       methocarbamol (ROBAXIN) 750 MG tablet Take 1 tablet (750 mg total) by mouth 3 (three) times daily as needed (muscle spasm/pain). Patient not taking: Reported on 07/27/2018      montelukast (SINGULAIR) 10 MG tablet Take 1 tablet (10 mg total) by mouth at bedtime.      nicotine (NICOTROL) 10 MG inhaler Inhale 1 cartridge (puff for 20 minutes) as needed. Use at least 6 cartridges/day the first 3-6 weeks, reduce gradually over 12 weeks      pantoprazole (PROTONIX) 40 MG tablet Take 1 tablet (40 mg  total) by mouth 2 (two) times daily.      Tiotropium Bromide Monohydrate (SPIRIVA RESPIMAT) 2.5 MCG/ACT AERS Inhale 2 puffs into the lungs daily.      traZODone (DESYREL) 100 MG tablet Take 1 tablet (100 mg total) by mouth at bedtime as needed for sleep.      warfarin (COUMADIN) 5 MG tablet Take 3 tablets (15mg ) on Sundays, Tuesdays, Thursdays and Saturdays once-daily at [facility]. On Mondays, Wednesdays and Fridays, take 2 & 1/2 tablets (12.5 mg).       Past Medical History:  Diagnosis Date  . Alcohol abuse   . Arthritis    knees  . Asthma    uses Albuterol daily as needed  . Bipolar 1 disorder ([place])   . Bronchitis    last time 2 yrs ago  . Cellulitis   . Chronic dermatitis    due to Xarelto Rx  . Cough    smokers   . Depression   . DVT (deep venous thrombosis) ([facility])    on Coumadin Rx  . DVT (deep venous thrombosis) ([place])   . GERD (gastroesophageal reflux disease)    only takes something about 2 times [name] yr  . GERD (gastroesophageal reflux disease)   . Heart murmur   . Joint pain   . Pulmonary embolism ([place]) 2014  . Shortness of breath   . Substance abuse ([place])    crack, cocaine, last use 2007  . Tobacco abuse   . Tuberculosis    ' I test Positive "   Social History   Socioeconomic History  . Marital status: Single    Spouse name: Not on file  . Number of children: Not on file  . Years of education: Not on file  . Highest education level: Not on file  Occupational History  . Occupation: Dentist: UNEMPLOYED  Social Needs  . Financial resource strain: Not on file  . Food insecurity:    Worry: Not on file    Inability: Not on file  . Transportation needs:    Medical: Not on file    Non-medical: Not on file  Tobacco Use  . Smoking status: Current Every Day Smoker    Packs/day: 0.50    Types: Cigarettes  . Smokeless tobacco: Former Systems developer  . Tobacco comment: 11/21/17: "0.5 PPD since age 62, none for 2 days"  Substance and Sexual Activity  . Alcohol use: No  . Drug use: No    Types: Cocaine    Comment: last use 2016  . Sexual activity: Yes    Partners: Female  Lifestyle  . Physical activity:    Days per week: Not on file    Minutes per session: Not on file  . Stress: Not on file  Relationships  . Social connections:    Talks on phone: Not on file    Gets together: Not on file    Attends religious service: Not on file    Active member of club or organization: Not on file    Attends meetings of clubs or organizations: Not on file    Relationship status: Not on file  Other Topics Concern  . Not on file  Social History Narrative   Working at [employer] now unemployed.  States he has insurance through them now.    Lives with mom and step dad           Family History  Problem Relation Age of Onset  . Asthma Mother   .  Throat cancer Father    ASSESSMENT Recent Results: Lab Results  Component Value Date   INR 2.3 11/04/2018   INR 4.4 ([name]) 08/21/2018   INR 1.77 07/27/2018   Anticoagulation Dosing: Description   Take 3 of your 5mg  peach-colored warfarin tablets by mouth, once-daily, at [facility] on Sundays, Tuesdays, Thursdays and Saturdays; On Mondays, Wednesdays and Fridays--take only 2 & 1/2 tablets of your 5mg  peach-colored warfarin tablets.      INR today: Therapeutic  PLAN Weekly dose was unchanged  There are no Patient Instructions on file for this visit. Patient advised to contact clinic or seek medical attention if signs/symptoms of bleeding or thromboembolism occur.  Patient verbalized understanding by repeating back information and was advised to contact me if further medication-related questions arise. Patient was also provided an information handout.  Follow-up Return in about 2 weeks (around 11/18/2018).  Flossie Dibble

## 2018-11-06 NOTE — Telephone Encounter (Signed)
See Dr Elie Confer and Dr Maudie Mercury telephone encounter.

## 2018-11-12 ENCOUNTER — Other Ambulatory Visit: Payer: Self-pay | Admitting: *Deleted

## 2018-11-12 ENCOUNTER — Telehealth: Payer: Self-pay | Admitting: Pharmacist

## 2018-11-12 ENCOUNTER — Telehealth: Payer: Self-pay | Admitting: *Deleted

## 2018-11-12 DIAGNOSIS — I2699 Other pulmonary embolism without acute cor pulmonale: Secondary | ICD-10-CM

## 2018-11-12 DIAGNOSIS — I82501 Chronic embolism and thrombosis of unspecified deep veins of right lower extremity: Secondary | ICD-10-CM

## 2018-11-12 DIAGNOSIS — Z7901 Long term (current) use of anticoagulants: Secondary | ICD-10-CM

## 2018-11-12 NOTE — Telephone Encounter (Signed)
Will call patient, document results as a phone note and increase warfarin from 97.5mg /week to 102.5mg /wk, Patient will perform patient self-testing in 1-2 weeks and apprise [name] of value.

## 2018-11-12 NOTE — Telephone Encounter (Signed)
Discussed with patient his PST, FS, POC INR value 1.9 on 97.5mg /wk warfarin. Will INCREASE to 102.5mg /wk warfarin. Repeat PST, FS, POC INR on 25-Nov-2018. Denies any symptoms/signs of embolic event(s) or of bleeding.

## 2018-11-12 NOTE — Telephone Encounter (Signed)
Reviewed Thanks DrG 

## 2018-11-12 NOTE — Telephone Encounter (Signed)
Received fax from [facility] (PT/INR Self-Testing)   INR 1.8 - done on 11/11/18. Result given to Dr Elie Confer.

## 2018-11-14 ENCOUNTER — Telehealth: Payer: Self-pay | Admitting: *Deleted

## 2018-11-14 ENCOUNTER — Other Ambulatory Visit: Payer: Self-pay

## 2018-11-14 ENCOUNTER — Ambulatory Visit (INDEPENDENT_AMBULATORY_CARE_PROVIDER_SITE_OTHER): Payer: Medicare Other | Admitting: Internal Medicine

## 2018-11-14 DIAGNOSIS — Z86718 Personal history of other venous thrombosis and embolism: Secondary | ICD-10-CM

## 2018-11-14 DIAGNOSIS — Z7901 Long term (current) use of anticoagulants: Secondary | ICD-10-CM

## 2018-11-14 DIAGNOSIS — M5416 Radiculopathy, lumbar region: Secondary | ICD-10-CM | POA: Diagnosis not present

## 2018-11-14 DIAGNOSIS — Z86711 Personal history of pulmonary embolism: Secondary | ICD-10-CM

## 2018-11-14 DIAGNOSIS — I82501 Chronic embolism and thrombosis of unspecified deep veins of right lower extremity: Secondary | ICD-10-CM

## 2018-11-14 DIAGNOSIS — M79605 Pain in left leg: Secondary | ICD-10-CM

## 2018-11-14 DIAGNOSIS — Z7951 Long term (current) use of inhaled steroids: Secondary | ICD-10-CM | POA: Diagnosis not present

## 2018-11-14 DIAGNOSIS — F17210 Nicotine dependence, cigarettes, uncomplicated: Secondary | ICD-10-CM

## 2018-11-14 DIAGNOSIS — J4540 Moderate persistent asthma, uncomplicated: Secondary | ICD-10-CM

## 2018-11-14 DIAGNOSIS — M79604 Pain in right leg: Secondary | ICD-10-CM | POA: Diagnosis not present

## 2018-11-14 DIAGNOSIS — Z79899 Other long term (current) drug therapy: Secondary | ICD-10-CM

## 2018-11-14 DIAGNOSIS — K219 Gastro-esophageal reflux disease without esophagitis: Secondary | ICD-10-CM

## 2018-11-14 NOTE — Telephone Encounter (Signed)
Called pt to schedule appt per Dr Sherry Ruffing - no answer; left message to call the office. I will also send message to front office.

## 2018-11-14 NOTE — Progress Notes (Signed)
  [facility] Telephone Encounter Continuity Care Appointment  HPI:   This telephone encounter was created for [name] on 11/14/2018 for the following purpose/cc bilateral leg pain, asthma, Hx of DVT/PE, and GERD.   Past Medical History:  Past Medical History:  Diagnosis Date  . Alcohol abuse   . Arthritis    knees  . Asthma    uses Albuterol daily as needed  . Bipolar 1 disorder ([place])   . Bronchitis    last time 2 yrs ago  . Cellulitis   . Chronic dermatitis    due to Xarelto Rx  . Cough    smokers  . Depression   . DVT (deep venous thrombosis) ([facility])    on Coumadin Rx  . DVT (deep venous thrombosis) ([place])   . GERD (gastroesophageal reflux disease)    only takes something about 2 times a yr  . GERD (gastroesophageal reflux disease)   . Heart murmur   . Joint pain   . Pulmonary embolism ([place]) 2014  . Shortness of breath   . Substance abuse ([place])    crack, cocaine, last use 2007  . Tobacco abuse   . Tuberculosis    ' I test Positive "      ROS:   Reports bilateral lower extremity pain, mild shortness of breath.  Denies any fevers, chest pain, chills, nausea, vomiting, headaches, lightheadedness, dizziness, lower extremity swelling, lower extremity numbness or tingling, or joint pains.   Assessment / Plan / Recommendations:   Please see A&P under problem oriented charting for assessment of the patient's acute and chronic medical conditions.   As always, pt is advised that if symptoms worsen or new symptoms arise, they should go to an urgent care facility or to to ER for further evaluation.   Consent and Medical Decision Making:   Patient discussed with Dr. Dareen Piano  This is a telephone encounter between [facility] and [name] on 11/14/2018 for bilateral leg pain, asthma, Hx of DVT/PE, and GERD.. The visit was conducted with the patient located at home and [name] at [facility]. The patient's identity was confirmed  using their DOB and current address. The patient has consented to being evaluated through a telephone encounter and understands the associated risks (an examination cannot be done and the patient may need to come in for an appointment) / benefits (allows the patient to remain at home, decreasing exposure to coronavirus). I personally spent 15 minutes on medical discussion.

## 2018-11-14 NOTE — Telephone Encounter (Signed)
-----   Message from [name], MD sent at 11/14/2018  1:51 PM EDT ----- Hello,   Patient needs an inpatient visit for next Wednesday when I am in the clinic to look at his legs and evaluate his lungs. Thank you!  [name]

## 2018-11-15 ENCOUNTER — Other Ambulatory Visit: Payer: Self-pay

## 2018-11-15 DIAGNOSIS — M79604 Pain in right leg: Secondary | ICD-10-CM | POA: Insufficient documentation

## 2018-11-15 DIAGNOSIS — J4540 Moderate persistent asthma, uncomplicated: Secondary | ICD-10-CM

## 2018-11-15 DIAGNOSIS — J45909 Unspecified asthma, uncomplicated: Secondary | ICD-10-CM

## 2018-11-15 MED ORDER — ALBUTEROL SULFATE HFA 108 (90 BASE) MCG/ACT IN AERS: 2.0000 | INHALATION_SPRAY | Freq: Four times a day (QID) | RESPIRATORY_TRACT | 6 refills | Status: AC | PRN

## 2018-11-15 MED ORDER — MONTELUKAST SODIUM 10 MG PO TABS: 10.0000 mg | ORAL_TABLET | Freq: Every day | ORAL | 11 refills | Status: DC

## 2018-11-15 MED ORDER — BUDESONIDE-FORMOTEROL FUMARATE 160-4.5 MCG/ACT IN AERO: 2.0000 | INHALATION_SPRAY | Freq: Two times a day (BID) | RESPIRATORY_TRACT | 11 refills | Status: AC

## 2018-11-15 NOTE — Assessment & Plan Note (Addendum)
Patient has a history of right lower extremity DVT and PE and is currently on warfarin daily, his most recent INR check was 2.4 and he attends the Coumadin clinic on a regular basis. He is now having bilateral lower extremity pain, will be evaluating him in the clinic for this. He denies any changes in his chronic right lower extremity swelling, color changes, or any sensation changes.  He reported that he does not think the pain is related to a DVT.   Continue coumadin and attending coumadin clinic with frequent INR checks

## 2018-11-15 NOTE — Progress Notes (Signed)
Internal Medicine Clinic Attending  Case discussed with Dr. Krienke at the time of the visit.  We reviewed the resident's history and exam and pertinent patient test results.  I agree with the assessment, diagnosis, and plan of care documented in the resident's note.    

## 2018-11-15 NOTE — Telephone Encounter (Signed)
Per Epic, pt has an appt on 5/13.

## 2018-11-15 NOTE — Assessment & Plan Note (Signed)
Patient has a history of right lower extremity DVT and PE and is currently on warfarin daily, his most recent INR check was 2.4 and he attends the Coumadin clinic on a regular basis. Today he is reporting bilateral lower extremity pain, he reports that it started about 1 to 2 months ago, it occurs in the upper thighs and the lower legs, he states that in the thigh area it is a stabbing and shooting pain and in the foot area it feels like someone is breaking.  He reports that he has had this pain before but states that this is much worse. He is able to ambulate but has been using his cane more frequently.  He has chronic right lower extremity swelling from his previous DVT, reports that he does not feel like a DVT.  He denies any color changes, numbness, tingling, weakness, or symptoms of other sensation changes in his legs.  He has been taking trazodone at night for sleep which he reports helps with the pain, he has tried icy spray and Voltaren gel which he states does not help.  He is also taking gabapentin which he reports does not help.  Patient has a history of lumbar radiculopathy which could be contributing.  I believe that we should do a physical exam to evaluate his lower extremities and will arrange for a in person visit.  I discussed this with the patient and he reported that he would like to wait until next week to come in.  -Send a message to the front desk to arrange in person visit

## 2018-11-15 NOTE — Assessment & Plan Note (Signed)
Patient is currently on pantoprazole 40 mg daily.  He reports that this has been helping his acid reflux significantly and thinks that this was an adequate dose.  Denies any issues taking the medications.  -Continue pantoprazole 40 mg daily

## 2018-11-15 NOTE — Telephone Encounter (Signed)
Return pt's call - stated he needs refills on his inhalers and requesting them to be sent to [facility] in [place].

## 2018-11-15 NOTE — Assessment & Plan Note (Addendum)
Patient is currently on singular 10 mg daily, Symbicort 2 puff BID, Spiriva 2 puffs daily, and albuterol as needed.  He reports that for about 1 month he has been increasing his albuterol use to 4-5 times a day, he reports that he has been taking it for chest tightness, wheezing, and shortness of breath.  He denies any recent sick contacts and has been staying at home due to the corona virus outbreak.  He denies any recent travel.  He has been sleeping okay.  He reported that he was almost out of some of his asthma medications. During the conversation patient does not appear short of breath and is able to speak in full sentences.  However given the increased albuterol use I am concerned that he may be having an exacerbation and would like to do a pulmonary exam in person.  -Refill Singulair 10 mg daily, albuterol PRN, and Symbicort 2 puffs BID -Will send a message to the front desk to arrange in person clinic visit

## 2018-11-15 NOTE — Telephone Encounter (Signed)
Requesting to speak with a nurse about meds. Please call back.  

## 2018-11-15 NOTE — Telephone Encounter (Signed)
Thank you :)

## 2018-11-18 LAB — POCT INR: INR: 3.3 — AB (ref 2.0–3.0)

## 2018-11-20 ENCOUNTER — Ambulatory Visit (INDEPENDENT_AMBULATORY_CARE_PROVIDER_SITE_OTHER): Payer: Medicare Other | Admitting: Internal Medicine

## 2018-11-20 ENCOUNTER — Other Ambulatory Visit: Payer: Self-pay

## 2018-11-20 VITALS — BP 117/90 | HR 86 | Temp 98.0°F

## 2018-11-20 DIAGNOSIS — M79605 Pain in left leg: Secondary | ICD-10-CM

## 2018-11-20 DIAGNOSIS — G629 Polyneuropathy, unspecified: Secondary | ICD-10-CM | POA: Diagnosis not present

## 2018-11-20 DIAGNOSIS — M79604 Pain in right leg: Secondary | ICD-10-CM | POA: Diagnosis not present

## 2018-11-20 DIAGNOSIS — F17210 Nicotine dependence, cigarettes, uncomplicated: Secondary | ICD-10-CM | POA: Diagnosis not present

## 2018-11-20 DIAGNOSIS — Z86718 Personal history of other venous thrombosis and embolism: Secondary | ICD-10-CM

## 2018-11-20 DIAGNOSIS — Z79899 Other long term (current) drug therapy: Secondary | ICD-10-CM | POA: Diagnosis not present

## 2018-11-20 DIAGNOSIS — J4540 Moderate persistent asthma, uncomplicated: Secondary | ICD-10-CM

## 2018-11-20 DIAGNOSIS — Z7901 Long term (current) use of anticoagulants: Secondary | ICD-10-CM | POA: Diagnosis not present

## 2018-11-20 DIAGNOSIS — J4541 Moderate persistent asthma with (acute) exacerbation: Secondary | ICD-10-CM | POA: Diagnosis not present

## 2018-11-20 DIAGNOSIS — M5416 Radiculopathy, lumbar region: Secondary | ICD-10-CM

## 2018-11-20 DIAGNOSIS — Z7951 Long term (current) use of inhaled steroids: Secondary | ICD-10-CM | POA: Diagnosis not present

## 2018-11-20 MED ORDER — PREDNISONE 20 MG PO TABS: 40.0000 mg | ORAL_TABLET | Freq: Every day | ORAL | 0 refills | Status: DC

## 2018-11-20 MED ORDER — AZITHROMYCIN 250 MG PO TABS: ORAL_TABLET | ORAL | 0 refills | Status: AC

## 2018-11-20 NOTE — Assessment & Plan Note (Deleted)
Patient reports that he still having bilateral lower extremity pain

## 2018-11-20 NOTE — Assessment & Plan Note (Signed)
Patient is currently on Singulair 10 mg daily, Symbicort 2 puffs twice daily, Spiriva 2 puffs daily, and albuterol as needed.  He states that for the last 2 months he has been increasing his skin inhaler up to 4-5 times a day, with symptoms of productive cough with clear sputum, wheezing, and shortness of breath.  He reports that he often gets allergies in the spring and fall time.  He denies any fevers, chills, nausea, vomiting, lightheadedness, dizziness.  He denies any recent travel or recent known sick contacts.  He was found to have a normal oxygenation, minimal wheezing noted on exam.  Given the increase in frequency this appears to be a mild exacerbation.  Plan -Prednisone 40 mg daily x5 days -Azithromycin 500 mg on first day, 250 mg x 4 days after that -Continue singular 10 mg daily, Symbicort 2 puffs twice daily, Spiriva 2 puffs daily, and albuterol as needed

## 2018-11-20 NOTE — Progress Notes (Signed)
   CC: Bilateral leg pain, shortness of breath, cough  HPI:  Mr.Kairen A Sittner is a 53 y.o.   Past Medical History:  Diagnosis Date  . Alcohol abuse   . Arthritis    knees  . Asthma    uses Albuterol daily as needed  . Bipolar 1 disorder ([place])   . Bronchitis    last time 2 yrs ago  . Cellulitis   . Chronic dermatitis    due to Xarelto Rx  . Cough    smokers  . Depression   . DVT (deep venous thrombosis) (HCC)    on Coumadin Rx  . DVT (deep venous thrombosis) ([place])   . GERD (gastroesophageal reflux disease)    only takes something about 2 times a yr  . GERD (gastroesophageal reflux disease)   . Heart murmur   . Joint pain   . Pulmonary embolism ([place]) 2014  . Shortness of breath   . Substance abuse ([place])    crack, cocaine, last use 2007  . Tobacco abuse   . Tuberculosis    ' I test Positive "   Review of Systems: Reports bilateral lower extremity pain productive cough with clear phlegm, wheezing.  Denies fevers, chills, nausea, vomiting, headaches, lightheadedness, dizziness, or other symptoms.  Physical Exam:  Vitals:   11/20/18 1008  BP: 117/90  Pulse: 86  Temp: 98 F (36.7 C)  TempSrc: Oral  SpO2: 99%   Physical Exam  Constitutional: He is well-developed, well-nourished, and in no distress.  Cardiovascular: Normal rate, regular rhythm, normal heart sounds and intact distal pulses.  Pulmonary/Chest: Effort normal and breath sounds normal. No respiratory distress.  Abdominal: Soft. Bowel sounds are normal.  Musculoskeletal:     Comments: Tenderness to palpation over L3/4 area, negative straight leg raise, no exacerbation with flexion or extension of hip, normal range of motion.  Chronic right lower extremity 1+ edema, no tenderness in the calf area, no other areas of swelling.  No joint edema, tenderness, swelling, or erythema.  Skin: Skin is warm and dry.     Assessment & Plan:   See Encounters Tab for problem based charting.  Patient discussed with  Dr. Beryle Beams

## 2018-11-20 NOTE — Patient Instructions (Addendum)
[name],  It was a pleasure to see you today. Thank you for coming in.   Today we discussed your asthma and back pain.  In regards to your asthma it seems like you are having an asthma exacerbation, please start taking prednisone 40 mg daily and azithromycin for the next 5 days. Please continue your other home medications.   We also discussed your back pain. We will be getting an x-ray of your back and will contact you with the results. You can continue taking over the counter ibuprofen for now, and hopefully the steroids should help as well.   Please return to clinic in 3 months or sooner if needed.   Thank you again for coming in.   [name].

## 2018-11-20 NOTE — Assessment & Plan Note (Signed)
Is having bilateral lower extreme pain, he states that he has been having this for a while now but that it is getting worse recently.  They occur in both legs, and his left leg it is from his knee down to his ankle and in his right leg its from his hip all the way down to his ankle.  He reported that nothing seems to improve it, and that it is worse at night.  He denied any worsening of symptoms with specific movements or with certain activities.  He has chronically lower extremity swelling from a previous DVT, does not feel like this is a DVT. On exam he has some point tenderness around the L3/4 paraspinal area, a negative straight leg raise test, normal flexion and extension of his back that does not reproduce the pain, no significant lower extremity edema(does have chronic right lower extremity edema that is unchanged per patient), no calf tenderness.  Normal pulses and warmth. He has a history of possible lumbar radiculopathy, however no imaging is noted in the chart.  He reported that he had never gotten imaging that he is aware of.  He is currently on Cymbalta 60 mg daily and gabapentin 900 mg 3 times daily for neuropathy.  Given the point tenderness and previous possible history of lumbar radiculopathy there is a possibility that this is the cause, no significant changes were found on exam.3  -Obtain x-ray of lumbar sacral area -Prescribing prednisone for asthma exacerbation, this may help with some of his pain as well

## 2018-11-21 NOTE — Progress Notes (Signed)
Medicine attending: Medical history, presenting problems, physical findings, and medications, reviewed with resident physician Dr [name] on the day of the patient visit and I concur with her evaluation and management plan. 53 y/o hx of prior RLE DVT on chronic warfarin. Asthma. C/O acute on chronic lower extrem pain w radiation down back of leg consistent w a radiculopathy. No motor deficits or other red flag signs; already on max dose Gabapentin & Cymbalta. Also on prn Vicodin. I can find no imaging studies of his back in the chart. Will start w regular LS spine films; may need MRI/Ortho referrral to consider surgery or epidural steroids. Room to add a NSAID, normal renal function, not diabetic. Given concomitant flare of his asthma at this time, will Rx w short course of steroids then re-evaluate.

## 2018-11-21 NOTE — Telephone Encounter (Signed)
done

## 2018-11-22 ENCOUNTER — Telehealth: Payer: Self-pay | Admitting: Pharmacy Technician

## 2018-11-22 ENCOUNTER — Ambulatory Visit (INDEPENDENT_AMBULATORY_CARE_PROVIDER_SITE_OTHER): Payer: Medicare Other | Admitting: Pharmacy Technician

## 2018-11-22 DIAGNOSIS — Z5181 Encounter for therapeutic drug level monitoring: Secondary | ICD-10-CM

## 2018-11-22 DIAGNOSIS — I82501 Chronic embolism and thrombosis of unspecified deep veins of right lower extremity: Secondary | ICD-10-CM

## 2018-11-22 DIAGNOSIS — Z7901 Long term (current) use of anticoagulants: Secondary | ICD-10-CM

## 2018-11-22 DIAGNOSIS — Z86718 Personal history of other venous thrombosis and embolism: Secondary | ICD-10-CM

## 2018-11-22 LAB — POCT INR: INR: 4 — AB (ref 2.0–3.0)

## 2018-11-22 NOTE — Telephone Encounter (Signed)
Anticoagulation Management [name] [name] is a 53 y.o. male who is contacted for monitoring of warfarin treatment.  Patient identity verified using date of birth and address.  Indication: DVT  Duration: indefinite Supervising physician: Joni Reining  Anticoagulation Clinic Visit History: Patient does not report signs/symptoms of bleeding or thromboembolism. Dietary changes: none Missed doses of warfarin: none Other medication changes: short course of azithromycin and prednisone started on 5/13    Allergies  Allergen Reactions  . Rivaroxaban Dermatitis    Blisters skin Blisters skin   . Clindamycin/Lincomycin Dermatitis    Severe rash/dermatitis from November 2015 still present Jan 2016 from clinda  . Clindamycin Other (See Comments)   Prior to Admission medications   Medication Sig Start Date End Date Taking? Authorizing Provider  albuterol (PROVENTIL) (2.5 MG/3ML) 0.083% nebulizer solution INHALE 1 VIAL VIA NEBULIZER EVERY 6 HOURS AS NEEDED FOR WHEEZING OR SHORTNESS OF BREATH 12/28/17   Nedrud, Larena Glassman, MD  albuterol (VENTOLIN HFA) 108 (90 Base) MCG/ACT inhaler Inhale 2 puffs into the lungs every 6 (six) hours as needed for wheezing or shortness of breath. 11/15/18   [name], MD  azithromycin (ZITHROMAX Z-PAK) 250 MG tablet Take 2 tablets (500 mg) on  Day 1,  followed by 1 tablet (250 mg) once daily on Days 2 through 5. 11/20/18 11/25/18  [name], MD  budesonide-formoterol [facility]) 160-4.5 MCG/ACT inhaler Inhale 2 puffs into the lungs 2 (two) times daily. Rinse mouth after use 11/15/18   [name], MD  cetirizine (ZYRTEC) 10 MG tablet Take 1 tablet (10 mg total) by mouth daily. 10/09/18   [name], MD  diclofenac sodium (VOLTAREN) 1 % GEL Apply 2 g topically 4 (four) times daily as needed (pain). 11/26/17   Rice, Resa Miner, MD  DULoxetine (CYMBALTA) 60 MG capsule Take 1 capsule (60 mg total) by mouth daily. 02/25/18   Forde Dandy, PharmD   furosemide (LASIX) 40 MG tablet Take 1 tablet (40 mg total) by mouth daily. 09/03/17   Thomasene Ripple, MD  gabapentin (NEURONTIN) 300 MG capsule Take 3 capsules (900 mg total) by mouth 3 (three) times daily. 09/03/17   Thomasene Ripple, MD  HYDROcodone-acetaminophen (NORCO/VICODIN) 5-325 MG tablet Take 1 tablet by mouth every 6 (six) hours as needed for severe pain. 07/27/18   Lorin Glass, PA-C  hydrocortisone cream 1 % Apply 1 application topically 2 (two) times daily. Patient taking differently: Apply 1 application topically 2 (two) times daily as needed for itching.  09/03/17   Thomasene Ripple, MD  methocarbamol (ROBAXIN) 750 MG tablet Take 1 tablet (750 mg total) by mouth 3 (three) times daily as needed (muscle spasm/pain). Patient not taking: Reported on 07/27/2018 06/22/18   Lajean Saver, MD  montelukast (SINGULAIR) 10 MG tablet Take 1 tablet (10 mg total) by mouth at bedtime. 11/15/18   [name], MD  nicotine (NICOTROL) 10 MG inhaler Inhale 1 cartridge (puff for 20 minutes) as needed. Use at least 6 cartridges/day the first 3-6 weeks, reduce gradually over 12 weeks 07/14/16   Lucious Groves, DO  pantoprazole (PROTONIX) 40 MG tablet Take 1 tablet (40 mg total) by mouth 2 (two) times daily. 07/24/18   Forde Dandy, PharmD  predniSONE (DELTASONE) 20 MG tablet Take 2 tablets (40 mg total) by mouth daily with breakfast. 11/20/18   [name], MD  Tiotropium Bromide Monohydrate (SPIRIVA RESPIMAT) 2.5 MCG/ACT AERS Inhale 2 puffs into the lungs daily. 06/11/18   [name]  M, MD  traZODone (DESYREL) 100 MG tablet Take 1 tablet (100 mg total) by mouth at bedtime as needed for sleep. 10/09/18   [name], MD  warfarin (COUMADIN) 5 MG tablet Take 3 tablets (15mg ) on Sundays, Tuesdays, Thursdays and Saturdays once-daily at [facility]. On Mondays, Wednesdays and Fridays, take 2 & 1/2 tablets (12.5 mg). 10/29/18   [name], MD   Past Medical History:  Diagnosis Date   . Alcohol abuse   . Arthritis    knees  . Asthma    uses Albuterol daily as needed  . Bipolar 1 disorder ([place])   . Bronchitis    last time 2 yrs ago  . Cellulitis   . Chronic dermatitis    due to Xarelto Rx  . Cough    smokers  . Depression   . DVT (deep venous thrombosis) (HCC)    on Coumadin Rx  . DVT (deep venous thrombosis) ([place])   . GERD (gastroesophageal reflux disease)    only takes something about 2 times a yr  . GERD (gastroesophageal reflux disease)   . Heart murmur   . Joint pain   . Pulmonary embolism ([place]) 2014  . Shortness of breath   . Substance abuse ([place])    crack, cocaine, last use 2007  . Tobacco abuse   . Tuberculosis    ' I test Positive "   Social History   Socioeconomic History  . Marital status: Single    Spouse name: Not on file  . Number of children: Not on file  . Years of education: Not on file  . Highest education level: Not on file  Occupational History  . Occupation: Dentist: UNEMPLOYED  Social Needs  . Financial resource strain: Not on file  . Food insecurity:    Worry: Not on file    Inability: Not on file  . Transportation needs:    Medical: Not on file    Non-medical: Not on file  Tobacco Use  . Smoking status: Current Every Day Smoker    Packs/day: 0.50    Types: Cigarettes  . Smokeless tobacco: Former Systems developer  . Tobacco comment: 11/21/17: "0.5 PPD since age 10, none for 2 days"  Substance and Sexual Activity  . Alcohol use: No  . Drug use: No    Types: Cocaine    Comment: last use 2016  . Sexual activity: Yes    Partners: Female  Lifestyle  . Physical activity:    Days per week: Not on file    Minutes per session: Not on file  . Stress: Not on file  Relationships  . Social connections:    Talks on phone: Not on file    Gets together: Not on file    Attends religious service: Not on file    Active member of club or organization: Not on file    Attends meetings of clubs or organizations: Not on  file    Relationship status: Not on file  Other Topics Concern  . Not on file  Social History Narrative   Working at [employer] now unemployed.  States he has insurance through them now.    Lives with mom and step dad          Family History  Problem Relation Age of Onset  . Asthma Mother   . Throat cancer Father     ASSESSMENT Recent Results: The most recent result is correlated with 102.5 mg per  week: Lab Results  Component Value Date   INR 4.0 (A) 11/22/2018   INR 2.3 11/04/2018   INR 4.4 (A) 08/21/2018   Goal INR 2.0-3.0 INR today: Supratherapeutic at 4.0 likely secondary to warfarin dose increase on 5/5 and initiation of prednisone and azithromycin 2 days ago. The patient also reports that he had an unscheduled INR check on 5/11 that resulted at 3.3, before starting the steroid and antibiotic.  PLAN Weekly dose was decreased to 100 mg per week (12.5 mg on Mondays and Thursdays and 15 mg all the other days of the week). The patient was instructed to hold his warfarin dose today, 5/15, for supratherapeutic INR.  Patient advised to contact clinic or seek medical attention if signs/symptoms of bleeding or thromboembolism occur. Patient educated to seek immediate medical attention if in an accident or fall where he hits his head to check for bleed.  Patient verbalized understanding by repeating back information and was advised to contact clinc if further medication-related questions arise. Patient given phone numbers to reach Dr. Elie Confer and Dr. Maudie Mercury for INR result reporting.  Follow-up Will f/u in 10 days on 5/25 to reassess warfarin regimen after completion of steroid and antibiotic  Brendolyn Patty, PharmD PGY1 Pharmacy Resident Phone [phone]  11/22/2018   4:10 PM

## 2018-11-22 NOTE — Telephone Encounter (Signed)
Opened in error

## 2018-11-26 ENCOUNTER — Telehealth: Payer: Self-pay | Admitting: Pharmacist

## 2018-11-26 NOTE — Telephone Encounter (Signed)
Patient texted me at 7:26PM 25-Nov-2018 reporting PST FS POC INR 3.7. Was instructed to DECREASE dose to 12.5mg  M/W/Th; 15mg  all other days. Next PST FS POC INR 09-Dec-2018. Off of prednisone in two days. Off of azithromycin now. No bleeding.

## 2018-12-03 DIAGNOSIS — Z7901 Long term (current) use of anticoagulants: Secondary | ICD-10-CM | POA: Diagnosis not present

## 2018-12-03 DIAGNOSIS — I2699 Other pulmonary embolism without acute cor pulmonale: Secondary | ICD-10-CM | POA: Diagnosis not present

## 2018-12-03 LAB — PROTIME-INR

## 2018-12-10 ENCOUNTER — Telehealth: Payer: Self-pay | Admitting: Pharmacist

## 2018-12-10 DIAGNOSIS — J449 Chronic obstructive pulmonary disease, unspecified: Secondary | ICD-10-CM

## 2018-12-10 MED ORDER — TIOTROPIUM BROMIDE MONOHYDRATE 2.5 MCG/ACT IN AERS: 2.0000 | INHALATION_SPRAY | Freq: Every day | RESPIRATORY_TRACT | 11 refills | Status: DC

## 2018-12-10 NOTE — Addendum Note (Signed)
Addended by: Forde Dandy on: 12/10/2018 01:59 PM   Modules accepted: Orders

## 2018-12-10 NOTE — Telephone Encounter (Signed)
Patient texted me his PST FS POC INR = 6.0 on 97.5mg  warfarin/wk. NO BLEEDING. No new medications or febrile illness. Will decrease to 90mg  warfarin/wk, repeat INR in 1 week (patient self-testing, point of care fingerstick INR).

## 2018-12-18 ENCOUNTER — Telehealth: Payer: Self-pay | Admitting: *Deleted

## 2018-12-18 NOTE — Telephone Encounter (Signed)
Received fax from [facility] with INR results from 11/04/2018 to 12/17/2018. Last INR 3.8 on 12/17/2018. Will place in Dr. Gladstone Pih box. Hubbard Hartshorn, RN, BSN

## 2018-12-19 ENCOUNTER — Telehealth: Payer: Self-pay | Admitting: Pharmacist

## 2018-12-19 NOTE — Telephone Encounter (Signed)
I contacted the patient the day of his patient-self testing point of care fingerstick INR determination "real-time", Tuesday 17-Dec-2018 at 1925h when he texted me in "real-time" (as I have instructed all of the PST patients to do--to avoid the delay in response/plan of action--while awaiting the fax from the device manufacturer). Patient was advised that evening to OMIT that days dose (Tuesday 17-Dec-2018), Wednesday take 2 tablets; Thursday take 2&1/2 tablets; Friday take 2&1/2 tablets; Saturday take JUST 2 tablets; Sunday take 2&1/2 tablets; Monday take 2&1/2 tablets; NEXT TUESDAY16-JUN-2020, REPEAT your PST POC FS INR and TEXT ME your results in "real-time". He agreed. No bleeding, No clotting, has adequate supply of warfarin on-hand.

## 2018-12-24 ENCOUNTER — Telehealth (INDEPENDENT_AMBULATORY_CARE_PROVIDER_SITE_OTHER): Payer: Medicare Other | Admitting: Pharmacist

## 2018-12-24 DIAGNOSIS — Z5181 Encounter for therapeutic drug level monitoring: Secondary | ICD-10-CM | POA: Diagnosis not present

## 2018-12-24 DIAGNOSIS — Z7901 Long term (current) use of anticoagulants: Secondary | ICD-10-CM | POA: Diagnosis not present

## 2018-12-24 DIAGNOSIS — Z86718 Personal history of other venous thrombosis and embolism: Secondary | ICD-10-CM

## 2018-12-24 DIAGNOSIS — I82501 Chronic embolism and thrombosis of unspecified deep veins of right lower extremity: Secondary | ICD-10-CM

## 2018-12-24 LAB — POCT INR: INR: 2.5 (ref 2.0–3.0)

## 2018-12-24 NOTE — Progress Notes (Signed)
Anticoagulation Management [name] [name] is a 53 y.o. male who was contacted for monitoring of warfarin treatment.    Indication:DVT, chronic DVT History of; Long term current use of anticoagulant.  Duration:indefinite Supervising Tool  Anticoagulation Clinic Visit History: Patientdoes notreport signs/symptoms of bleeding or thromboembolism  Other recent changes:Nodiet, medications, lifestylechanges.  Anticoagulation Episode Summary    Current INR goal:  2.0-3.0  TTR:  55.9 % (3.7 y)  Next INR check:  12/31/2018  INR from last check:  2.5 (12/24/2018)  Weekly max warfarin dose:    Target end date:    INR check location:    Preferred lab:    Send INR reminders to:  ANT[facility]   Indications   Chronic deep vein thrombosis (DVT) of right lower extremity ([place]) [I82.501] Long-term (current) use of anticoagulants [Z79.01] DVT (Resolved) [I82.409]       Comments:        Anticoagulation Care Providers    Provider Role Specialty Phone number   Bartholomew Crews, MD  Internal Medicine [phone]     Allergies  Allergen Reactions  . Rivaroxaban Dermatitis    Blisters skin Blisters skin   . Clindamycin/Lincomycin Dermatitis    Severe rash/dermatitis from November 2015 still present Jan 2016 from clinda  . Clindamycin Other (See Comments)    Current Outpatient Medications:  .  albuterol (PROVENTIL) (2.5 MG/3ML) 0.083% nebulizer solution, INHALE 1 VIAL VIA NEBULIZER EVERY 6 HOURS AS NEEDED FOR WHEEZING OR SHORTNESS OF BREATH, Disp: 90 mL, Rfl: 3 .  albuterol (VENTOLIN HFA) 108 (90 Base) MCG/ACT inhaler, Inhale 2 puffs into the lungs every 6 (six) hours as needed for wheezing or shortness of breath., Disp: 1 Inhaler, Rfl: 6 .  budesonide-formoterol (SYMBICORT) 160-4.5 MCG/ACT inhaler, Inhale 2 puffs into the lungs 2 (two) times daily. Rinse mouth after use, Disp: 1 Inhaler, Rfl: 11 .  cetirizine (ZYRTEC) 10 MG tablet, Take 1 tablet (10 mg  total) by mouth daily., Disp: 30 tablet, Rfl: 2 .  diclofenac sodium (VOLTAREN) 1 % GEL, Apply 2 g topically 4 (four) times daily as needed (pain)., Disp: 100 g, Rfl: 0 .  DULoxetine (CYMBALTA) 60 MG capsule, Take 1 capsule (60 mg total) by mouth daily., Disp: 90 capsule, Rfl: 3 .  furosemide (LASIX) 40 MG tablet, Take 1 tablet (40 mg total) by mouth daily., Disp: 90 tablet, Rfl: 3 .  gabapentin (NEURONTIN) 300 MG capsule, Take 3 capsules (900 mg total) by mouth 3 (three) times daily., Disp: 810 capsule, Rfl: 3 .  HYDROcodone-acetaminophen (NORCO/VICODIN) 5-325 MG tablet, Take 1 tablet by mouth every 6 (six) hours as needed for severe pain., Disp: 8 tablet, Rfl: 0 .  hydrocortisone cream 1 %, Apply 1 application topically 2 (two) times daily. (Patient taking differently: Apply 1 application topically 2 (two) times daily as needed for itching. ), Disp: 30 g, Rfl: 0 .  methocarbamol (ROBAXIN) 750 MG tablet, Take 1 tablet (750 mg total) by mouth 3 (three) times daily as needed (muscle spasm/pain). (Patient not taking: Reported on 07/27/2018), Disp: 15 tablet, Rfl: 0 .  montelukast (SINGULAIR) 10 MG tablet, Take 1 tablet (10 mg total) by mouth at bedtime., Disp: 30 tablet, Rfl: 11 .  nicotine (NICOTROL) 10 MG inhaler, Inhale 1 cartridge (puff for 20 minutes) as needed. Use at least 6 cartridges/day the first 3-6 weeks, reduce gradually over 12 weeks, Disp: 504 each, Rfl: 1 .  pantoprazole (PROTONIX) 40 MG tablet, Take 1 tablet (40 mg total) by mouth 2 (  two) times daily., Disp: 180 tablet, Rfl: 3 .  predniSONE (DELTASONE) 20 MG tablet, Take 2 tablets (40 mg total) by mouth daily with breakfast., Disp: 10 tablet, Rfl: 0 .  Tiotropium Bromide Monohydrate (SPIRIVA RESPIMAT) 2.5 MCG/ACT AERS, Inhale 2 puffs into the lungs daily., Disp: 1 Inhaler, Rfl: 11 .  traZODone (DESYREL) 100 MG tablet, Take 1 tablet (100 mg total) by mouth at bedtime as needed for sleep., Disp: 90 tablet, Rfl: 3 .  warfarin (COUMADIN) 5 MG  tablet, Take 3 tablets (15mg ) on Sundays, Tuesdays, Thursdays and Saturdays once-daily at [facility]. On Mondays, Wednesdays and Fridays, take 2 & 1/2 tablets (12.5 mg)., Disp: 80 tablet, Rfl: 5 Past Medical History:  Diagnosis Date  . Alcohol abuse   . Arthritis    knees  . Asthma    uses Albuterol daily as needed  . Bipolar 1 disorder ([place])   . Bronchitis    last time 2 yrs ago  . Cellulitis   . Chronic dermatitis    due to Xarelto Rx  . Cough    smokers  . Depression   . DVT (deep venous thrombosis) (HCC)    on Coumadin Rx  . DVT (deep venous thrombosis) ([place])   . GERD (gastroesophageal reflux disease)    only takes something about 2 times a yr  . GERD (gastroesophageal reflux disease)   . Heart murmur   . Joint pain   . Pulmonary embolism ([place]) 2014  . Shortness of breath   . Substance abuse ([place])    crack, cocaine, last use 2007  . Tobacco abuse   . Tuberculosis    ' I test Positive "   Social History   Socioeconomic History  . Marital status: Single    Spouse name: Not on file  . Number of children: Not on file  . Years of education: Not on file  . Highest education level: Not on file  Occupational History  . Occupation: Dentist: UNEMPLOYED  Social Needs  . Financial resource strain: Not on file  . Food insecurity    Worry: Not on file    Inability: Not on file  . Transportation needs    Medical: Not on file    Non-medical: Not on file  Tobacco Use  . Smoking status: Current Every Day Smoker    Packs/day: 0.50    Types: Cigarettes  . Smokeless tobacco: Former Systems developer  . Tobacco comment: 11/21/17: "0.5 PPD since age 60, none for 2 days"  Substance and Sexual Activity  . Alcohol use: No  . Drug use: No    Types: Cocaine    Comment: last use 2016  . Sexual activity: Yes    Partners: Female  Lifestyle  . Physical activity    Days per week: Not on file    Minutes per session: Not on file  . Stress: Not on file  Relationships  . Social  Herbalist on phone: Not on file    Gets together: Not on file    Attends religious service: Not on file    Active member of club or organization: Not on file    Attends meetings of clubs or organizations: Not on file    Relationship status: Not on file  Other Topics Concern  . Not on file  Social History Narrative   Working at [employer] now unemployed.  States he has insurance through them now.    Lives with mom  and step dad          Family History  Problem Relation Age of Onset  . Asthma Mother   . Throat cancer Father    ASSESSMENT Recent Results: Lab Results  Component Value Date   INR 2.5 12/24/2018   INR 4.0 (A) 11/22/2018   INR 3.3 (A) 11/18/2018   Anticoagulation Dosing: Description   Take 12.5 mg by mouth on Mondays and Thursdays and take 15 mg all other days of the week.     INR today: Therapeutic  PLAN Weekly dose was unchanged  Patient advised to contact clinic or seek medical attention if signs/symptoms of bleeding or thromboembolism occur.  Patient verbalized understanding by repeating back information and was advised to contact me if further medication-related questions arise.   Follow-up 1 week  Flossie Dibble

## 2018-12-25 ENCOUNTER — Telehealth: Payer: Self-pay

## 2018-12-25 NOTE — Telephone Encounter (Signed)
Received fax from [facility] for INR results from 11/04/18 - 12/24/18 with last result listed on 12/24/18 of INR 2.5 Will forward to Dr. Elie Confer and place report in Dr. Gladstone Pih box Laurence Compton, RN,BSN

## 2018-12-26 ENCOUNTER — Other Ambulatory Visit: Payer: Self-pay | Admitting: Pharmacist

## 2018-12-26 DIAGNOSIS — J45909 Unspecified asthma, uncomplicated: Secondary | ICD-10-CM

## 2018-12-26 DIAGNOSIS — M5416 Radiculopathy, lumbar region: Secondary | ICD-10-CM

## 2018-12-26 DIAGNOSIS — J449 Chronic obstructive pulmonary disease, unspecified: Secondary | ICD-10-CM

## 2018-12-27 ENCOUNTER — Other Ambulatory Visit: Payer: Self-pay | Admitting: Internal Medicine

## 2018-12-27 DIAGNOSIS — J45909 Unspecified asthma, uncomplicated: Secondary | ICD-10-CM

## 2018-12-27 MED ORDER — DULOXETINE HCL 60 MG PO CPEP: 60.0000 mg | ORAL_CAPSULE | Freq: Every day | ORAL | 3 refills | Status: AC

## 2018-12-27 MED ORDER — ALBUTEROL SULFATE (2.5 MG/3ML) 0.083% IN NEBU: INHALATION_SOLUTION | RESPIRATORY_TRACT | 3 refills | Status: AC

## 2018-12-27 MED ORDER — SPIRIVA RESPIMAT 2.5 MCG/ACT IN AERS: 2.0000 | INHALATION_SPRAY | Freq: Every day | RESPIRATORY_TRACT | 11 refills | Status: AC

## 2018-12-27 NOTE — Telephone Encounter (Signed)
Needs refill on albuterol (PROVENTIL) (2.5 MG/3ML) 0.083% nebulizer solution DULoxetine (CYMBALTA) 60 MG capsule   ;pt contact [place], [place] - [id number] AT [place]

## 2018-12-27 NOTE — Telephone Encounter (Signed)
Per patient-refill should go to [facility] on [address].Regenia Skeeter, Brandyn Thien Cassady6/19/20209:34 AM

## 2018-12-27 NOTE — Telephone Encounter (Signed)
Call to patient to confirm refill request (albuterol neb solution vs inhaler). request for duloxetine already in separate encounter-will add albuterol neb solution to request.  Of note, pt wants refill to go to [place].Marland Kitchen Marland KitchenRegenia Skeeter, Laquitta Dominski Cassady6/19/20209:27 AM

## 2019-01-01 NOTE — Telephone Encounter (Signed)
Patient was contacted on date of patient-self-testing, point of care fingerstick INR test he performed on 12/24/2018 and advised to repeat this on 12/31/2018.

## 2019-01-07 DIAGNOSIS — Z7901 Long term (current) use of anticoagulants: Secondary | ICD-10-CM | POA: Diagnosis not present

## 2019-01-07 DIAGNOSIS — I2699 Other pulmonary embolism without acute cor pulmonale: Secondary | ICD-10-CM | POA: Diagnosis not present

## 2019-01-24 ENCOUNTER — Telehealth: Payer: Self-pay | Admitting: Pharmacist

## 2019-01-24 NOTE — Telephone Encounter (Signed)
Patient texted me last PM at home reporting PSF FS POC INR determination of 2.4 on 77.5mg /wk warfarin. Advised to CONTINUE on 2&1/2 x 5mg  (12.5mg ) on MWF; 2x5mg  (10mg ) on Su/Tu/Th/Sa. Repeat PSF FS POC INR on 06-Feb-2019.

## 2019-02-03 ENCOUNTER — Telehealth: Payer: Self-pay | Admitting: Internal Medicine

## 2019-02-03 NOTE — Telephone Encounter (Signed)
Pt having left arm pain and breathing problems x 1 week and is requesting an appt.  Please call patient back.

## 2019-02-03 NOTE — Telephone Encounter (Signed)
Return pt's call - we are currently close ;instructed pt to go to UC with his left arm pain and trouble breathing. Stated he will.

## 2019-02-12 ENCOUNTER — Telehealth: Payer: Self-pay | Admitting: Pharmacist

## 2019-02-12 NOTE — Telephone Encounter (Signed)
Patient is a patient-self testing, finger-stick, point of care patient. Texted me last night while I was home, reporting INR 2.8 on 77.5mg /wk warfarin. Advised him to CONTINUE this SAME regimen and re-collect in 2 weeks.

## 2019-02-13 NOTE — Telephone Encounter (Signed)
I agree

## 2019-02-26 ENCOUNTER — Telehealth: Payer: Self-pay | Admitting: Pharmacist

## 2019-02-26 DIAGNOSIS — I2699 Other pulmonary embolism without acute cor pulmonale: Secondary | ICD-10-CM | POA: Diagnosis not present

## 2019-02-26 DIAGNOSIS — Z7901 Long term (current) use of anticoagulants: Secondary | ICD-10-CM | POA: Diagnosis not present

## 2019-02-26 NOTE — Telephone Encounter (Signed)
Received text from patient at 2209h to my cell phone [phone] reporting PST FS POC INR = 3.7 (goal 2.0 - 3.0) on 77.5mg  warfarin/wk. Advised to DECREASE weekly dose to 72.5mg  warfarin/wk as 2x5mg  PO QD--EXCEPT on Mondays, take 2&1/2 x 5mg  on Mondays. Repeat PST FS POC INR on 11-Mar-2019. Reports no signs or symptoms of bleeding, on no new medications. No extra-doses taken.

## 2019-02-27 NOTE — Telephone Encounter (Signed)
I agree

## 2019-03-14 ENCOUNTER — Telehealth: Payer: Self-pay | Admitting: *Deleted

## 2019-03-14 NOTE — Telephone Encounter (Signed)
Received fax from [facility] with INR results from 12/10/2018 to 03/12/2019. Last INR 3.0 on 03/12/2019. Results placed in Dr. Gladstone Pih box. Hubbard Hartshorn, RN, BSN

## 2019-04-01 ENCOUNTER — Other Ambulatory Visit: Payer: Self-pay

## 2019-04-01 DIAGNOSIS — M5416 Radiculopathy, lumbar region: Secondary | ICD-10-CM

## 2019-04-01 DIAGNOSIS — K219 Gastro-esophageal reflux disease without esophagitis: Secondary | ICD-10-CM

## 2019-04-01 NOTE — Telephone Encounter (Signed)
gabapentin (NEURONTIN) 300 MG capsule   pantoprazole (PROTONIX) 40 MG tablet   REFILL REQUEST @  [facility] [id number] - [address], [address] AT [address] [place] [phone] (Phone) [phone] (Fax)

## 2019-04-02 MED ORDER — GABAPENTIN 300 MG PO CAPS: 900.0000 mg | ORAL_CAPSULE | Freq: Three times a day (TID) | ORAL | 1 refills | Status: DC

## 2019-04-02 MED ORDER — PANTOPRAZOLE SODIUM 40 MG PO TBEC: 40.0000 mg | DELAYED_RELEASE_TABLET | Freq: Two times a day (BID) | ORAL | 1 refills | Status: DC

## 2019-04-07 ENCOUNTER — Ambulatory Visit (INDEPENDENT_AMBULATORY_CARE_PROVIDER_SITE_OTHER): Payer: Medicare Other | Admitting: Internal Medicine

## 2019-04-07 ENCOUNTER — Encounter: Payer: Self-pay | Admitting: Internal Medicine

## 2019-04-07 ENCOUNTER — Other Ambulatory Visit: Payer: Self-pay

## 2019-04-07 VITALS — BP 113/84 | HR 112 | Temp 98.6°F | Ht 76.0 in | Wt 263.2 lb

## 2019-04-07 DIAGNOSIS — J069 Acute upper respiratory infection, unspecified: Secondary | ICD-10-CM

## 2019-04-07 DIAGNOSIS — Z79899 Other long term (current) drug therapy: Secondary | ICD-10-CM | POA: Diagnosis not present

## 2019-04-07 DIAGNOSIS — F17210 Nicotine dependence, cigarettes, uncomplicated: Secondary | ICD-10-CM | POA: Diagnosis not present

## 2019-04-07 DIAGNOSIS — I82501 Chronic embolism and thrombosis of unspecified deep veins of right lower extremity: Secondary | ICD-10-CM

## 2019-04-07 DIAGNOSIS — F3163 Bipolar disorder, current episode mixed, severe, without psychotic features: Secondary | ICD-10-CM

## 2019-04-07 DIAGNOSIS — Z23 Encounter for immunization: Secondary | ICD-10-CM

## 2019-04-07 DIAGNOSIS — Z7951 Long term (current) use of inhaled steroids: Secondary | ICD-10-CM

## 2019-04-07 DIAGNOSIS — J4540 Moderate persistent asthma, uncomplicated: Secondary | ICD-10-CM | POA: Diagnosis not present

## 2019-04-07 DIAGNOSIS — F319 Bipolar disorder, unspecified: Secondary | ICD-10-CM

## 2019-04-07 DIAGNOSIS — Z0000 Encounter for general adult medical examination without abnormal findings: Secondary | ICD-10-CM

## 2019-04-07 NOTE — Patient Instructions (Addendum)
[name],  It was a pleasure to see you today. Thank you for coming in.   Today we discussed your chronic medical conditions. You are doing well, no changes were made to your medications. I have placed a referral for a new psychiatrist. You received a flu vaccine.  Please let us know if your cold does not improve or starts to worsen.  Please return to clinic in 6 months or sooner if needed.   Thank you again for coming in.   [name].

## 2019-04-07 NOTE — Progress Notes (Signed)
CC: history of DVT, asthma, URI  HPI:  [name] is a 53 y.o.  with a PMH listed below presenting for history of DVT, asthma, URI.  Overall patient reports that he is feeling well today and denies any new complaints.  He is here for a general checkup. He reports that he is getting over a cold.  Since Friday he has been having some mildly worsening shortness of breath, with a productive cough of white-mucus, he has been using his nebulizer up to 3 times a day however he states that it he has been improving each day since then.  Denies any current chest pain, chest tightness, shortness of breath, fevers, chills, or other symptoms at this time.  Please see A&P for status of the patient's chronic medical conditions  Past Medical History:  Diagnosis Date  . Alcohol abuse   . Arthritis    knees  . Asthma    uses Albuterol daily as needed  . Bipolar 1 disorder ([place])   . Bronchitis    last time 2 yrs ago  . Cellulitis   . Chronic dermatitis    due to Xarelto Rx  . Cough    smokers  . Depression   . DVT (deep venous thrombosis) ([facility])    on Coumadin Rx  . DVT (deep venous thrombosis) ([place])   . GERD (gastroesophageal reflux disease)    only takes something about 2 times a yr  . GERD (gastroesophageal reflux disease)   . Heart murmur   . Joint pain   . Pulmonary embolism ([place]) 2014  . Shortness of breath   . Substance abuse ([place])    crack, cocaine, last use 2007  . Tobacco abuse   . Tuberculosis    ' I test Positive "   Review of Systems: Refer to history of present illness and assessment and plans for pertinent review of systems, all others reviewed and negative.  Physical Exam:  Vitals:   04/07/19 1537  BP: 113/84  Pulse: (!) 112  Temp: 98.6 F (37 C)  TempSrc: Oral  SpO2: 98%  Weight: 263 lb 3.2 oz (119.4 kg)  Height: 6\' 4"  (1.93 m)    Physical Exam  Constitutional: He is well-developed, well-nourished, and in no distress.  Cardiovascular: Normal heart  sounds.  Tacycardic  Pulmonary/Chest: Effort normal and breath sounds normal. No respiratory distress.  Abdominal: Soft. Bowel sounds are normal. He exhibits no distension.  Musculoskeletal: Normal range of motion.     Comments: RLE: 1+ edema, mild erythema  Skin: Skin is warm and dry.  Psychiatric: Mood and affect normal.    Social History   Socioeconomic History  . Marital status: Single    Spouse name: Not on file  . Number of children: Not on file  . Years of education: Not on file  . Highest education level: Not on file  Occupational History  . Occupation: Dentist: UNEMPLOYED  Social Needs  . Financial resource strain: Not on file  . Food insecurity    Worry: Not on file    Inability: Not on file  . Transportation needs    Medical: Not on file    Non-medical: Not on file  Tobacco Use  . Smoking status: Current Every Day Smoker    Packs/day: 0.50    Types: Cigarettes  . Smokeless tobacco: Former Network engineer and Sexual Activity  . Alcohol use: No  . Drug use: No    Types:  Cocaine    Comment: last use 2016  . Sexual activity: Yes    Partners: Female  Lifestyle  . Physical activity    Days per week: Not on file    Minutes per session: Not on file  . Stress: Not on file  Relationships  . Social Herbalist on phone: Not on file    Gets together: Not on file    Attends religious service: Not on file    Active member of club or organization: Not on file    Attends meetings of clubs or organizations: Not on file    Relationship status: Not on file  . Intimate partner violence    Fear of current or ex partner: Not on file    Emotionally abused: Not on file    Physically abused: Not on file    Forced sexual activity: Not on file  Other Topics Concern  . Not on file  Social History Narrative   Working at [employer] now unemployed.  States he has insurance through them now.    Lives with mom and step dad            Family History  Problem Relation Age of Onset  . Asthma Mother   . Throat cancer Father     Assessment & Plan:   See Encounters Tab for problem based charting.  Patient discussed with Dr. Lynnae January

## 2019-04-09 ENCOUNTER — Emergency Department (HOSPITAL_COMMUNITY)
Admission: EM | Admit: 2019-04-09 | Discharge: 2019-04-10 | Disposition: A | Discharge status: Home / Self Care | Payer: Medicare Other | Attending: Emergency Medicine | Admitting: Emergency Medicine

## 2019-04-09 ENCOUNTER — Other Ambulatory Visit: Payer: Self-pay

## 2019-04-09 DIAGNOSIS — R062 Wheezing: Secondary | ICD-10-CM | POA: Diagnosis not present

## 2019-04-09 DIAGNOSIS — Z7901 Long term (current) use of anticoagulants: Secondary | ICD-10-CM | POA: Insufficient documentation

## 2019-04-09 DIAGNOSIS — I110 Hypertensive heart disease with heart failure: Secondary | ICD-10-CM | POA: Insufficient documentation

## 2019-04-09 DIAGNOSIS — F17210 Nicotine dependence, cigarettes, uncomplicated: Secondary | ICD-10-CM | POA: Diagnosis not present

## 2019-04-09 DIAGNOSIS — Z79899 Other long term (current) drug therapy: Secondary | ICD-10-CM | POA: Diagnosis not present

## 2019-04-09 DIAGNOSIS — N200 Calculus of kidney: Secondary | ICD-10-CM

## 2019-04-09 DIAGNOSIS — I5032 Chronic diastolic (congestive) heart failure: Secondary | ICD-10-CM | POA: Diagnosis not present

## 2019-04-09 DIAGNOSIS — R319 Hematuria, unspecified: Secondary | ICD-10-CM | POA: Insufficient documentation

## 2019-04-09 LAB — URINALYSIS, ROUTINE W REFLEX MICROSCOPIC
Bilirubin Urine: NEGATIVE
Glucose, UA: NEGATIVE mg/dL
Ketones, ur: NEGATIVE mg/dL
Leukocytes,Ua: NEGATIVE
Nitrite: NEGATIVE
Protein, ur: 100 mg/dL — AB
RBC / HPF: >50 RBC/hpf — ABNORMAL HIGH (ref 0–5)
Specific Gravity, Urine: 1.023 (ref 1.005–1.030)
pH: 6 (ref 5.0–8.0)

## 2019-04-09 LAB — CBC
HCT: 46.8 % (ref 39.0–52.0)
Hemoglobin: 16.2 g/dL (ref 13.0–17.0)
MCH: 33.1 pg (ref 26.0–34.0)
MCHC: 34.6 g/dL (ref 30.0–36.0)
MCV: 95.7 fL (ref 80.0–100.0)
Platelets: 208 10*3/uL (ref 150–400)
RBC: 4.89 MIL/uL (ref 4.22–5.81)
RDW: 13.6 % (ref 11.5–15.5)
WBC: 6.5 10*3/uL (ref 4.0–10.5)
nRBC: 0 % (ref 0.0–0.2)

## 2019-04-09 LAB — BASIC METABOLIC PANEL
Anion gap: 10 (ref 5–15)
BUN: 8 mg/dL (ref 6–20)
CO2: 23 mmol/L (ref 22–32)
Calcium: 9.1 mg/dL (ref 8.9–10.3)
Chloride: 107 mmol/L (ref 98–111)
Creatinine, Ser: 1.12 mg/dL (ref 0.61–1.24)
GFR calc Af Amer: >60 mL/min (ref >60)
GFR calc non Af Amer: >60 mL/min (ref >60)
Glucose, Bld: 101 mg/dL — ABNORMAL HIGH (ref 70–99)
Potassium: 3.9 mmol/L (ref 3.5–5.1)
Sodium: 140 mmol/L (ref 135–145)

## 2019-04-09 NOTE — Progress Notes (Signed)
Internal Medicine Clinic Attending  Case discussed with Dr. Krienke at the time of the visit.  We reviewed the resident's history and exam and pertinent patient test results.  I agree with the assessment, diagnosis, and plan of care documented in the resident's note.    

## 2019-04-09 NOTE — Assessment & Plan Note (Signed)
Patient received flu shot 

## 2019-04-09 NOTE — Assessment & Plan Note (Signed)
Patient continues have some lower extremity leg pain due to history of extremity DVT, he is currently on warfarin and follows with the Coumadin clinic on a regular basis.  On exam he has right lower extremity swelling, erythema, mild warmth.  He denies any changes to his symptoms.  -Continue Coumadin, Coumadin clinic

## 2019-04-09 NOTE — Assessment & Plan Note (Signed)
Patient is currently on Singulair 10 mg daily, Symbicort 2 puffs twice a day, Spiriva 2 puffs daily, and albuterol nebulizer as needed.  Says for the past 3 days he has been having increased use of his albuterol, up to 3 doses per day.  He reports that he feels like he is getting over cold.  Since Friday he has been having some mildly worsening shortness of breath, with a productive cough of white-mucus, he has been using his nebulizer up to 3 times a day however he states that it he has been improving each day since then.  Denies any current chest pain, chest tightness, shortness of breath, fevers, chills, or other symptoms at this time.  On exam he does have some bilateral wheezing, normal work of breathing and oxygenating well on room air.  He does not appear to have an acute asthma exacerbation so we will hold off on any steroids or antibiotics.  Advised him that if symptoms worsen or he has a productive cough with yellow/dark mucus to return to clinic to be evaluated and he expressed understanding.   -Continue Singulair 10 mg daily, Symbicort 2 puffs twice a day, Spiriva 2 puffs daily, and albuterol nebulizer as needed.

## 2019-04-09 NOTE — Assessment & Plan Note (Signed)
Patient is currently on Cymbalta 60 mg tablets daily.  He reports that his bipolar disorder is well controlled at this time he had been following with the psychiatrist in the past but has not seen them for a while.  He would need a new psychiatrist to follow-up with.  Will place referral for psychiatry.

## 2019-04-09 NOTE — ED Triage Notes (Signed)
Pt here for flank pain and hematuria since this morning. Pt sts he is just getting over a cold and has been wheezing for a week. Denies shob and fevers.

## 2019-04-09 NOTE — ED Notes (Signed)
Pt asked how many people there were in front of him, this NT informed him there are still 3 people in front of him. Pt did not say anything and walked out the door.

## 2019-04-09 NOTE — ED Notes (Signed)
Pt states "I can go home and die of internal bleeding I been here over 6 hours. Informed pt that he has a room and is waiting for it to be cleaned so he can go back

## 2019-04-10 ENCOUNTER — Emergency Department (HOSPITAL_COMMUNITY): Payer: Medicare Other

## 2019-04-10 ENCOUNTER — Encounter: Payer: Self-pay | Admitting: Licensed Clinical Social Worker

## 2019-04-10 ENCOUNTER — Telehealth: Payer: Self-pay | Admitting: Licensed Clinical Social Worker

## 2019-04-10 DIAGNOSIS — N200 Calculus of kidney: Secondary | ICD-10-CM | POA: Diagnosis not present

## 2019-04-10 DIAGNOSIS — R062 Wheezing: Secondary | ICD-10-CM | POA: Diagnosis not present

## 2019-04-10 LAB — PROTIME-INR
INR: 2 — ABNORMAL HIGH (ref 0.8–1.2)
Prothrombin Time: 22.5 seconds — ABNORMAL HIGH (ref 11.4–15.2)

## 2019-04-10 MED ORDER — ALBUTEROL SULFATE HFA 108 (90 BASE) MCG/ACT IN AERS
8.0000 | INHALATION_SPRAY | Freq: Once | RESPIRATORY_TRACT | Status: AC
  Administered 2019-04-10: 8 via RESPIRATORY_TRACT
  Filled 2019-04-10: qty 6.7

## 2019-04-10 MED ORDER — PREDNISONE 20 MG PO TABS
60.0000 mg | ORAL_TABLET | Freq: Once | ORAL | Status: AC
  Administered 2019-04-10: 03:00:00 60 mg via ORAL
  Filled 2019-04-10: qty 3

## 2019-04-10 MED ORDER — AEROCHAMBER PLUS FLO-VU MEDIUM MISC
1.0000 | Freq: Once | Status: DC
  Filled 2019-04-10: qty 1

## 2019-04-10 MED ORDER — PREDNISONE 20 MG PO TABS: 40.0000 mg | ORAL_TABLET | Freq: Every day | ORAL | 0 refills | Status: AC

## 2019-04-10 NOTE — Discharge Instructions (Addendum)
Please schedule a follow-up appointment with the urologist listed.  Your blood counts and Coumadin level were reassuring today.  You may continue to have some bleeding until you see urologist.  If you have any chest pain, shortness of breath, or lightheadedness, please return to the ER.  Also, return to the ER if you are unable to urinate, or run a fever, or have severe abdominal pain.
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
## 2019-04-10 NOTE — Telephone Encounter (Signed)
Patient was called due to a referral from his doctor. Patient did not answer, and a vm was left for the patient to contact our office to establish care.

## 2019-04-10 NOTE — ED Notes (Signed)
Patient transported to CT 

## 2019-04-11 IMAGING — CR DG CHEST 2V
2 series · 2 of 2 positions shown · non-contrast
Comparison: Chest radiograph performed 11/19/2017

CLINICAL DATA: Acute onset of wheezing and hemoptysis.

EXAM:
CHEST - 2 VIEW

[chest pa]
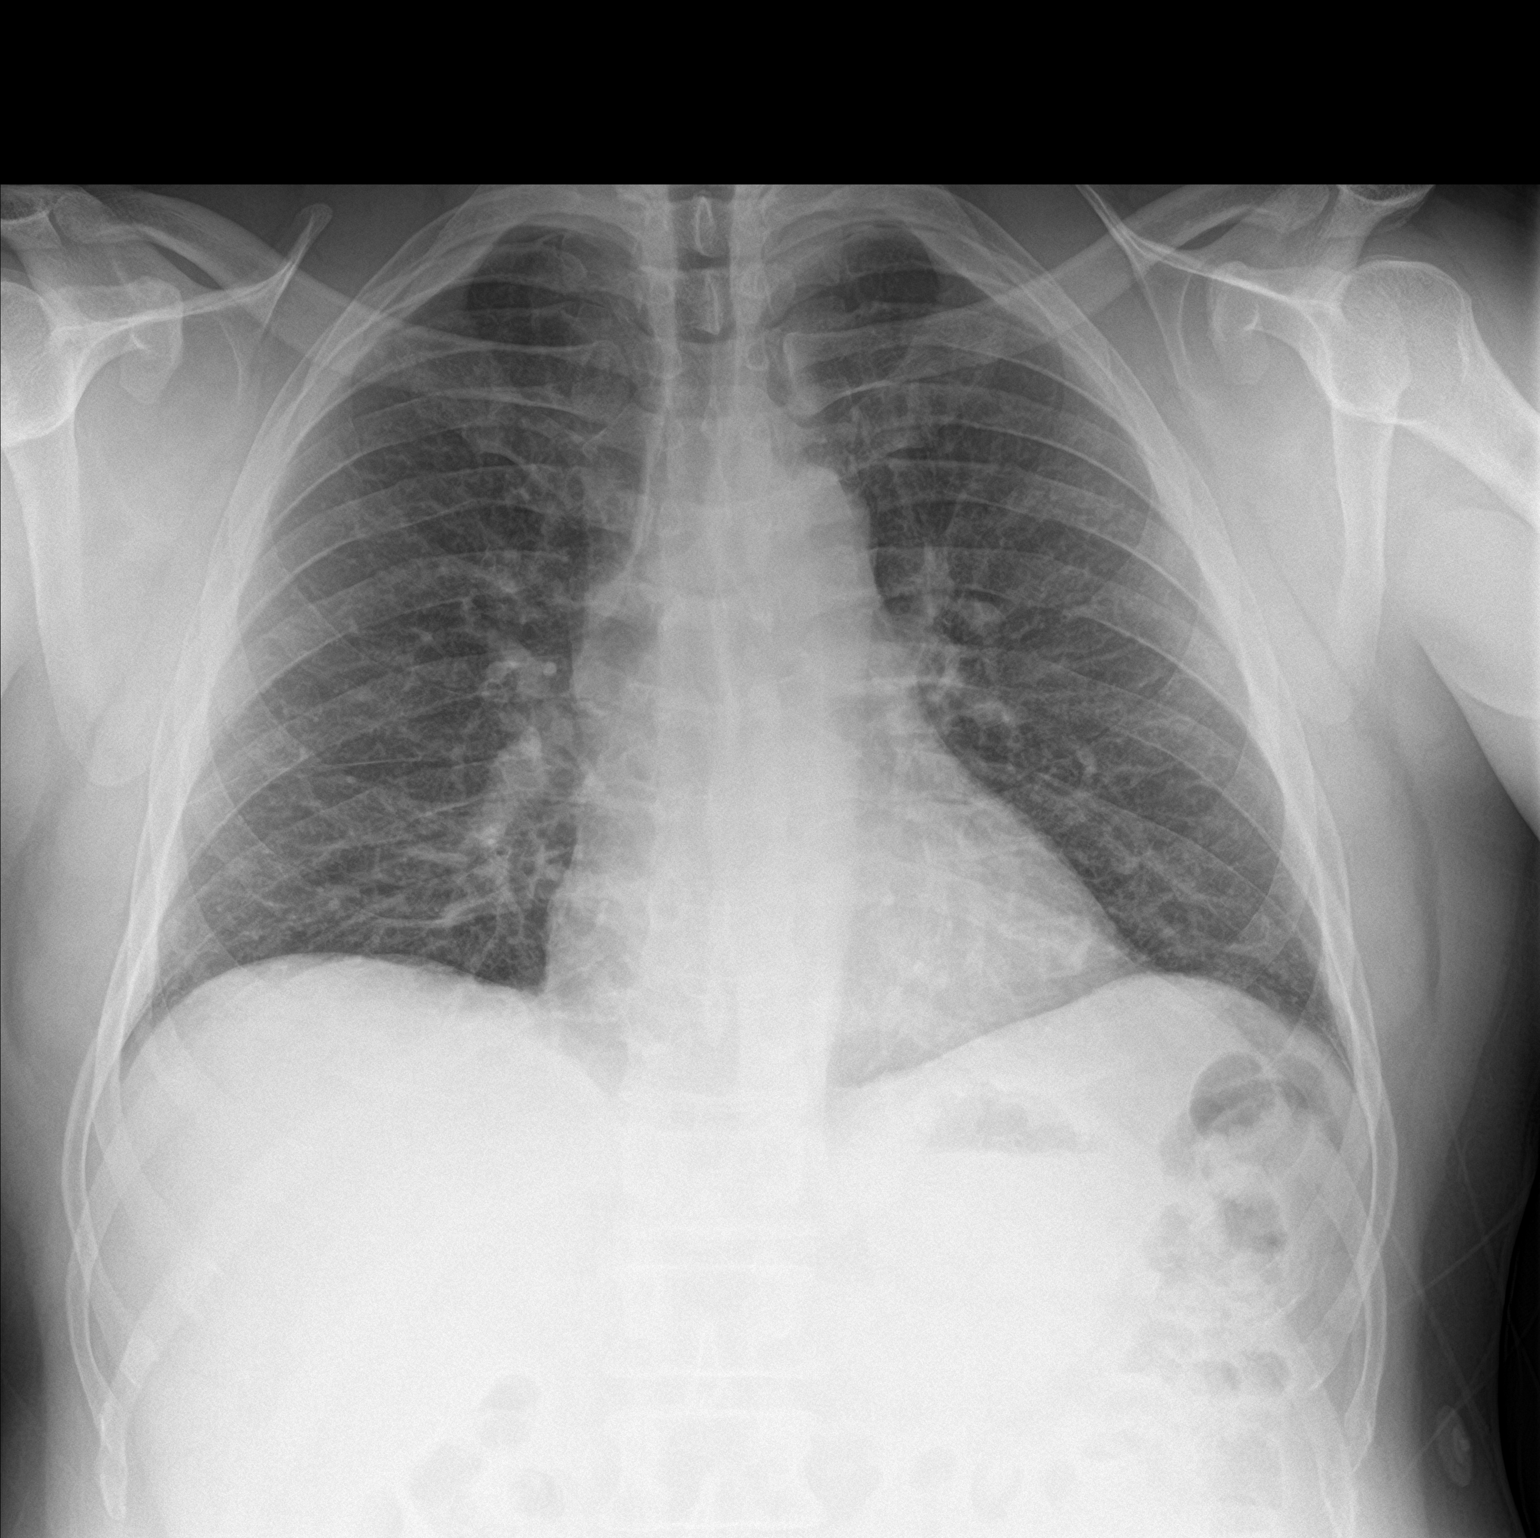

[chest lat]
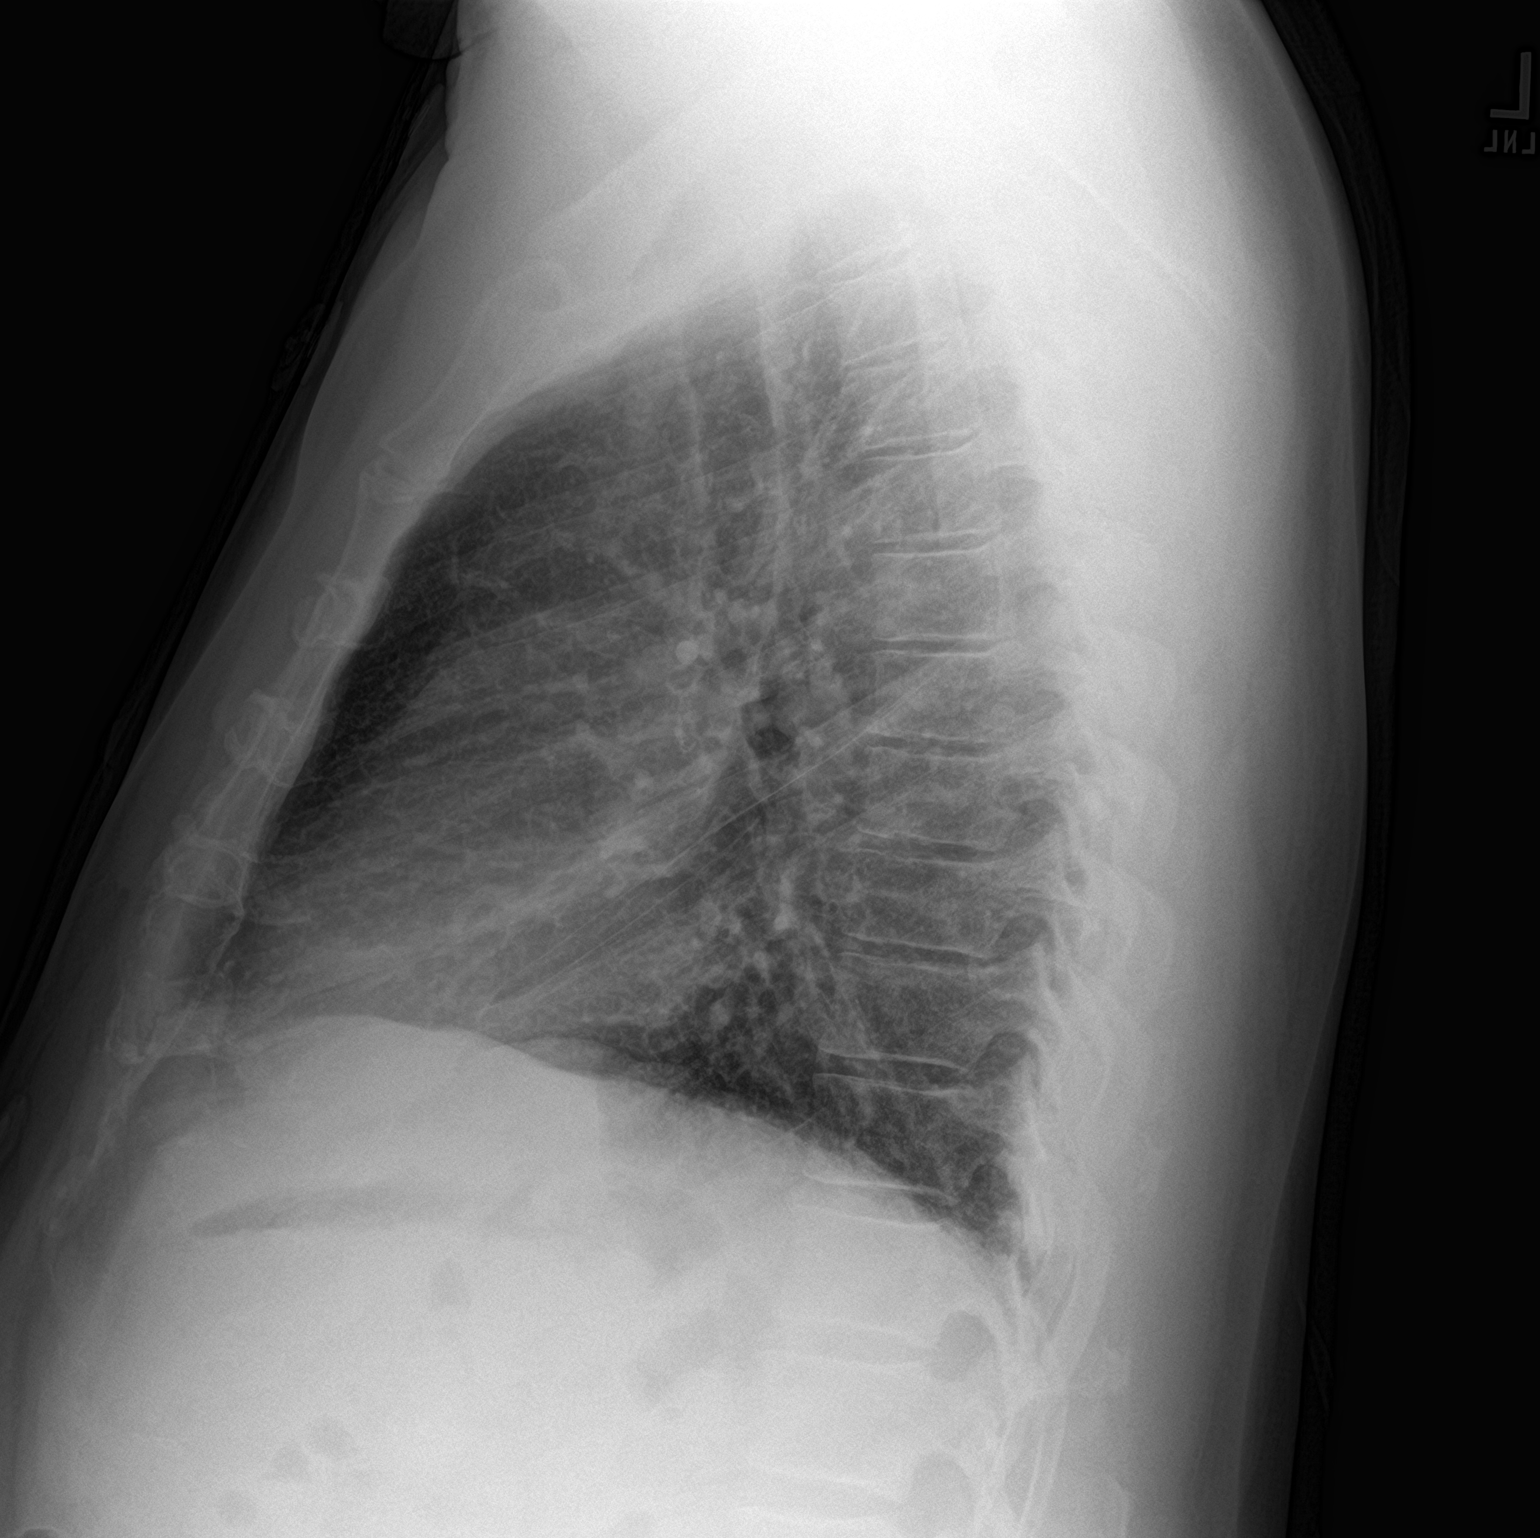

[2 of 2 positions shown; findings below may reference images not displayed]

FINDINGS: The lungs are well-aerated. Mildly increased interstitial markings
are noted. There is no evidence of focal opacification, pleural
effusion or pneumothorax.

The heart is normal in size; the mediastinal contour is within
normal limits. No acute osseous abnormalities are seen.
IMPRESSION: Mildly increased interstitial markings noted; lungs otherwise clear.

## 2019-04-17 ENCOUNTER — Telehealth: Payer: Self-pay | Admitting: Licensed Clinical Social Worker

## 2019-04-17 NOTE — Telephone Encounter (Signed)
Patient was called (2nd attempt), and a vm was left for the patient to call our office to schedule a future appointment with myself.

## 2019-05-02 DIAGNOSIS — N200 Calculus of kidney: Secondary | ICD-10-CM | POA: Diagnosis not present

## 2019-05-02 DIAGNOSIS — R310 Gross hematuria: Secondary | ICD-10-CM | POA: Diagnosis not present

## 2019-05-07 ENCOUNTER — Other Ambulatory Visit: Payer: Self-pay | Admitting: Internal Medicine

## 2019-05-07 ENCOUNTER — Other Ambulatory Visit: Payer: Self-pay | Admitting: Pharmacist

## 2019-05-07 DIAGNOSIS — I2699 Other pulmonary embolism without acute cor pulmonale: Secondary | ICD-10-CM

## 2019-05-07 DIAGNOSIS — I82511 Chronic embolism and thrombosis of right femoral vein: Secondary | ICD-10-CM

## 2019-05-12 NOTE — Addendum Note (Signed)
Addended by: Hulan Fray on: 05/12/2019 06:26 PM   Modules accepted: Orders

## 2019-05-16 DIAGNOSIS — R310 Gross hematuria: Secondary | ICD-10-CM | POA: Diagnosis not present

## 2019-05-16 DIAGNOSIS — N200 Calculus of kidney: Secondary | ICD-10-CM | POA: Diagnosis not present

## 2019-05-23 DIAGNOSIS — R310 Gross hematuria: Secondary | ICD-10-CM | POA: Diagnosis not present

## 2019-05-23 DIAGNOSIS — N200 Calculus of kidney: Secondary | ICD-10-CM | POA: Diagnosis not present

## 2019-05-27 ENCOUNTER — Telehealth: Payer: Self-pay | Admitting: Pharmacist

## 2019-05-27 ENCOUNTER — Telehealth: Payer: Self-pay

## 2019-05-27 NOTE — Telephone Encounter (Signed)
Advised the patient by VM that he may call me on my cell phone to provide results of his patient self-testing, finger-stick, point of care INR test results. Will await his return call.

## 2019-05-27 NOTE — Telephone Encounter (Signed)
Patient called me results of PST FS POC INR = 2.3 (goal 2.0 - 3.0) on 10mg  warfarin daily EXCEPT on Mondays, 12.5mg . Advised to CONTINUE this regimen and re-test in 2 weeks.

## 2019-05-27 NOTE — Telephone Encounter (Signed)
Requesting to speak with a nurse about INR results. Please call pt back.

## 2019-05-28 ENCOUNTER — Other Ambulatory Visit: Payer: Self-pay | Admitting: Urology

## 2019-06-10 NOTE — Patient Instructions (Addendum)
DUE TO COVID-19 ONLY ONE VISITOR IS ALLOWED TO COME WITH YOU AND STAY IN THE WAITING ROOM ONLY DURING PRE OP AND PROCEDURE DAY OF SURGERY. THE 1 VISITOR MAY VISIT WITH YOU AFTER SURGERY IN YOUR PRIVATE ROOM DURING VISITING HOURS ONLY!  YOU NEED TO HAVE A COVID 19 TEST ON__Thursday 12/03/2020_____ @__0915  am_____, THIS TEST MUST BE DONE BEFORE SURGERY, COME  [place]  , [address].  ([place]) ONCE YOUR COVID TEST IS COMPLETED, PLEASE BEGIN THE QUARANTINE INSTRUCTIONS AS OUTLINED IN YOUR HANDOUT.                [name] [name]     Your procedure is scheduled on: Monday 06/16/2019   Report to [facility] Main  Entrance    Report to Short Stay at  0530 AM               Do Not smoke 24 hours before surgery!    Call this number if you have problems the morning of surgery [phone]    Remember: Do not eat food or drink liquids :After Midnight. BRUSH YOUR TEETH MORNING OF SURGERY AND RINSE YOUR MOUTH OUT, NO CHEWING GUM CANDY OR MINTS.     Take these medicines the morning of surgery with A SIP OF WATER: Gabapentin (neurontin), Duloxetine (Cymbalta), Pantoprazole (Protonix),  use Albuterol inhaler and nebulizer if needed, use Symbicort inhaler and bring inhalers with you to the hospital.                                 You may not have any metal on your body including hair pins and              piercings  Do not wear jewelry, make-up, lotions, powders or perfumes, deodorant                        Men may shave face and neck.   Do not bring valuables to the hospital. [place].  Contacts, dentures or bridgework may not be worn into surgery.  Leave suitcase in the car. After surgery it may be brought to your room.     Patients discharged the day of surgery will not be allowed to drive home. IF YOU ARE HAVING SURGERY AND GOING HOME THE SAME DAY, YOU MUST HAVE AN ADULT TO DRIVE YOU HOME AND BE  WITH YOU FOR 24 HOURS. YOU MAY GO HOME BY TAXI OR UBER OR ORTHERWISE, BUT AN ADULT MUST ACCOMPANY YOU HOME AND STAY WITH YOU FOR 24 HOURS.  Name and phone number of your driver:fiancee'-[name] [name]  [phone]               Please read over the following fact sheets you were given: _____________________________________________________________________             [facility] - Preparing for Surgery Before surgery, you can play an important role.  Because skin is not sterile, your skin needs to be as free of germs as possible.  You can reduce the number of germs on your skin by washing with CHG (chlorahexidine gluconate) soap before surgery.  CHG is an antiseptic cleaner which kills germs and bonds with the skin to continue killing germs even after washing. Please DO NOT use if  you have an allergy to CHG or antibacterial soaps.  If your skin becomes reddened/irritated stop using the CHG and inform your nurse when you arrive at Short Stay. Do not shave (including legs and underarms) for at least 48 hours prior to the first CHG shower.  You may shave your face/neck. Please follow these instructions carefully:  1.  Shower with CHG Soap the night before surgery and the  morning of Surgery.  2.  If you choose to wash your hair, wash your hair first as usual with your  normal  shampoo.  3.  After you shampoo, rinse your hair and body thoroughly to remove the  shampoo.                           4.  Use CHG as you would any other liquid soap.  You can apply chg directly  to the skin and wash                       Gently with a scrungie or clean washcloth.  5.  Apply the CHG Soap to your body ONLY FROM THE NECK DOWN.   Do not use on face/ open                           Wound or open sores. Avoid contact with eyes, ears mouth and genitals (private parts).                       Wash face,  Genitals (private parts) with your normal soap.             6.  Wash thoroughly, paying special attention to the area  where your surgery  will be performed.  7.  Thoroughly rinse your body with warm water from the neck down.  8.  DO NOT shower/wash with your normal soap after using and rinsing off  the CHG Soap.                9.  Pat yourself dry with a clean towel.            10.  Wear clean pajamas.            11.  Place clean sheets on your bed the night of your first shower and do not  sleep with pets. Day of Surgery : Do not apply any lotions/deodorants the morning of surgery.  Please wear clean clothes to the hospital/surgery center.  FAILURE TO FOLLOW THESE INSTRUCTIONS MAY RESULT IN THE CANCELLATION OF YOUR SURGERY PATIENT SIGNATURE_________________________________  NURSE SIGNATURE__________________________________  ________________________________________________________________________

## 2019-06-12 ENCOUNTER — Other Ambulatory Visit (HOSPITAL_COMMUNITY)
Admission: RE | Admit: 2019-06-12 | Discharge: 2019-06-12 | Disposition: A | Discharge status: Home / Self Care | Payer: Medicare Other | Source: Ambulatory Visit | Attending: Urology | Admitting: Urology

## 2019-06-12 ENCOUNTER — Other Ambulatory Visit: Payer: Self-pay

## 2019-06-12 ENCOUNTER — Encounter (HOSPITAL_COMMUNITY)
Admission: RE | Admit: 2019-06-12 | Discharge: 2019-06-12 | Disposition: A | Discharge status: Home / Self Care | Payer: Medicare Other | Source: Ambulatory Visit | Attending: Urology | Admitting: Urology

## 2019-06-12 ENCOUNTER — Telehealth: Payer: Self-pay | Admitting: *Deleted

## 2019-06-12 ENCOUNTER — Encounter (HOSPITAL_COMMUNITY): Payer: Self-pay

## 2019-06-12 ENCOUNTER — Telehealth: Payer: Self-pay | Admitting: Pharmacist

## 2019-06-12 DIAGNOSIS — Z86711 Personal history of pulmonary embolism: Secondary | ICD-10-CM | POA: Diagnosis not present

## 2019-06-12 DIAGNOSIS — Z86718 Personal history of other venous thrombosis and embolism: Secondary | ICD-10-CM | POA: Diagnosis not present

## 2019-06-12 DIAGNOSIS — Z20828 Contact with and (suspected) exposure to other viral communicable diseases: Secondary | ICD-10-CM | POA: Diagnosis not present

## 2019-06-12 DIAGNOSIS — F319 Bipolar disorder, unspecified: Secondary | ICD-10-CM | POA: Insufficient documentation

## 2019-06-12 DIAGNOSIS — F17200 Nicotine dependence, unspecified, uncomplicated: Secondary | ICD-10-CM | POA: Insufficient documentation

## 2019-06-12 DIAGNOSIS — Z7901 Long term (current) use of anticoagulants: Secondary | ICD-10-CM | POA: Insufficient documentation

## 2019-06-12 DIAGNOSIS — N200 Calculus of kidney: Secondary | ICD-10-CM | POA: Diagnosis not present

## 2019-06-12 DIAGNOSIS — Z79899 Other long term (current) drug therapy: Secondary | ICD-10-CM | POA: Diagnosis not present

## 2019-06-12 DIAGNOSIS — K219 Gastro-esophageal reflux disease without esophagitis: Secondary | ICD-10-CM | POA: Diagnosis not present

## 2019-06-12 DIAGNOSIS — J45909 Unspecified asthma, uncomplicated: Secondary | ICD-10-CM | POA: Diagnosis not present

## 2019-06-12 DIAGNOSIS — I2699 Other pulmonary embolism without acute cor pulmonale: Secondary | ICD-10-CM | POA: Diagnosis not present

## 2019-06-12 DIAGNOSIS — M170 Bilateral primary osteoarthritis of knee: Secondary | ICD-10-CM | POA: Diagnosis not present

## 2019-06-12 DIAGNOSIS — Z01818 Encounter for other preprocedural examination: Secondary | ICD-10-CM | POA: Insufficient documentation

## 2019-06-12 LAB — CBC
HCT: 48.4 % (ref 39.0–52.0)
Hemoglobin: 15.8 g/dL (ref 13.0–17.0)
MCH: 31.7 pg (ref 26.0–34.0)
MCHC: 32.6 g/dL (ref 30.0–36.0)
MCV: 97 fL (ref 80.0–100.0)
Platelets: 204 10*3/uL (ref 150–400)
RBC: 4.99 MIL/uL (ref 4.22–5.81)
RDW: 13.7 % (ref 11.5–15.5)
WBC: 6.1 10*3/uL (ref 4.0–10.5)
nRBC: 0 % (ref 0.0–0.2)

## 2019-06-12 LAB — BASIC METABOLIC PANEL
Anion gap: 9 (ref 5–15)
BUN: 14 mg/dL (ref 6–20)
CO2: 26 mmol/L (ref 22–32)
Calcium: 9.3 mg/dL (ref 8.9–10.3)
Chloride: 103 mmol/L (ref 98–111)
Creatinine, Ser: 1.12 mg/dL (ref 0.61–1.24)
GFR calc Af Amer: >60 mL/min (ref >60)
GFR calc non Af Amer: >60 mL/min (ref >60)
Glucose, Bld: 86 mg/dL (ref 70–99)
Potassium: 4.4 mmol/L (ref 3.5–5.1)
Sodium: 138 mmol/L (ref 135–145)

## 2019-06-12 NOTE — Telephone Encounter (Signed)
Received fax from [facility] by [facility] with INR results from 01/23/2019 to 06/11/2019. Last INR 1.9 on 06/11/2019. Results placed in Dr. Gladstone Pih box. Hubbard Hartshorn, RN, BSN

## 2019-06-12 NOTE — Telephone Encounter (Signed)
Patient called me on my personal cell phone at home, reporting PST, FS, POC INR = 1.9.  Also stated he is having "laser removal of a 'kidney stone' on 16-Jun-2019." I described the "lithotripsy" procedure for kidney stones and he responded this is NOT what he is having. States the physician performing this advised that warfarin cessation would not be necessary. Out of an abundance of caution (vacularity of the renal parenchyma) I recommended that he stop his warfarin for 3 days prior to procedure. As such, this evenings dose will be last dose. He will OMIT warfarin for Friday, Saturday, Sunday and of course Monday, day of the procedure. He will recommence warfarin within 24h of the procedure.

## 2019-06-12 NOTE — Progress Notes (Signed)
Called and LM on VM for patient to return call as he was informed at his pre-op appointment today 06/12/2019 to get in touch with Dr. Paulla Dolly  About the Coumadin, whether to stay on it for surgery or to stop it for surgery.Also LM on VM for Darrel Reach, scheduler for Dr. Gloriann Loan to send me any information she has about this patient staying on Coumadin for surgery by Dr. Gloriann Loan.

## 2019-06-12 NOTE — Progress Notes (Addendum)
PCP - Dr. [name] Cardiologist - N/A  Chest x-ray - 04/10/2019 Epic 1 view EKG - 07/30/2018 Epic  Stress Test - N/A ECHO - 11/30/2014 EPIC Cardiac Cath - N/A  Sleep Study - 01/25/2018  01/25/2018-Dr. Clinton Young CPAP - NO  Fasting Blood Sugar - N/A Checks Blood Sugar _____ times a day 0 Blood Thinner Instructions:Patient states that Dr. Gloriann Loan informed patient to not stop Coumadin for surgery. Instructed patient to call Dr. Paulla Dolly who doses his Coumadin to get instructions from him . Patient informed me that Dr. Elie Confer does not know he is having surgery on 06/16/2019. [facility]  Aspirin Instructions:N/A Last Dose:06/11/2019  Anesthesia review:  Chart given to Konrad Felix, PA to review.  Patient has a history of PE (2014), DVT, Heart murmur,and  venous insufficiency    Patient denies shortness of breath, fever, cough and chest pain at PAT appointment   Patient verbalized understanding of instructions that were given to them at the PAT appointment. Patient was also instructed that they will need to review over the PAT instructions again at home before surgery.
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
## 2019-06-13 NOTE — Telephone Encounter (Signed)
I agree with that plan. Thank you.

## 2019-06-13 NOTE — Progress Notes (Signed)
Patient called me letting me know that he did talk to Dr. Paulla Dolly about his upcoming surgery on 06/16/2019 and Dr. Elie Confer had given him instructions to stop Coumadin last night 06/12/2019 for his surgery on 06/16/2019. Patient verbalized understanding of this.

## 2019-06-15 LAB — NOVEL CORONAVIRUS, NAA (HOSP ORDER, SEND-OUT TO REF LAB; TAT 18-24 HRS): SARS-CoV-2, NAA: NOT DETECTED

## 2019-06-15 MED ORDER — DEXTROSE 5 % IV SOLN
3.0000 g | INTRAVENOUS | Status: AC
  Administered 2019-06-16: 3 g via INTRAVENOUS
  Filled 2019-06-15: qty 3

## 2019-06-16 ENCOUNTER — Other Ambulatory Visit: Payer: Self-pay

## 2019-06-16 ENCOUNTER — Ambulatory Visit (HOSPITAL_COMMUNITY): Payer: Medicare Other | Admitting: Physician Assistant

## 2019-06-16 ENCOUNTER — Ambulatory Visit (HOSPITAL_COMMUNITY): Payer: Medicare Other

## 2019-06-16 ENCOUNTER — Encounter (HOSPITAL_COMMUNITY)
Admission: RE | Disposition: A | Discharge status: Home / Self Care | Payer: Self-pay | Source: Home / Self Care | Attending: Urology

## 2019-06-16 ENCOUNTER — Ambulatory Visit (HOSPITAL_COMMUNITY): Payer: Medicare Other | Admitting: Anesthesiology

## 2019-06-16 ENCOUNTER — Encounter (HOSPITAL_COMMUNITY): Payer: Self-pay

## 2019-06-16 ENCOUNTER — Ambulatory Visit (HOSPITAL_COMMUNITY)
Admission: RE | Admit: 2019-06-16 | Discharge: 2019-06-16 | Disposition: A | Discharge status: Home / Self Care | Payer: Medicare Other | Attending: Urology | Admitting: Urology

## 2019-06-16 DIAGNOSIS — Z7901 Long term (current) use of anticoagulants: Secondary | ICD-10-CM | POA: Diagnosis not present

## 2019-06-16 DIAGNOSIS — N202 Calculus of kidney with calculus of ureter: Secondary | ICD-10-CM | POA: Insufficient documentation

## 2019-06-16 DIAGNOSIS — Z86718 Personal history of other venous thrombosis and embolism: Secondary | ICD-10-CM | POA: Diagnosis not present

## 2019-06-16 DIAGNOSIS — F419 Anxiety disorder, unspecified: Secondary | ICD-10-CM | POA: Diagnosis not present

## 2019-06-16 DIAGNOSIS — N35911 Unspecified urethral stricture, male, meatal: Secondary | ICD-10-CM | POA: Diagnosis not present

## 2019-06-16 DIAGNOSIS — Z79899 Other long term (current) drug therapy: Secondary | ICD-10-CM | POA: Insufficient documentation

## 2019-06-16 DIAGNOSIS — J45909 Unspecified asthma, uncomplicated: Secondary | ICD-10-CM | POA: Diagnosis not present

## 2019-06-16 DIAGNOSIS — N201 Calculus of ureter: Secondary | ICD-10-CM | POA: Diagnosis not present

## 2019-06-16 DIAGNOSIS — F329 Major depressive disorder, single episode, unspecified: Secondary | ICD-10-CM | POA: Insufficient documentation

## 2019-06-16 DIAGNOSIS — F17200 Nicotine dependence, unspecified, uncomplicated: Secondary | ICD-10-CM | POA: Insufficient documentation

## 2019-06-16 DIAGNOSIS — N35919 Unspecified urethral stricture, male, unspecified site: Secondary | ICD-10-CM | POA: Diagnosis not present

## 2019-06-16 DIAGNOSIS — I509 Heart failure, unspecified: Secondary | ICD-10-CM | POA: Diagnosis not present

## 2019-06-16 DIAGNOSIS — N200 Calculus of kidney: Secondary | ICD-10-CM | POA: Diagnosis present

## 2019-06-16 DIAGNOSIS — I5032 Chronic diastolic (congestive) heart failure: Secondary | ICD-10-CM | POA: Diagnosis not present

## 2019-06-16 DIAGNOSIS — Z7951 Long term (current) use of inhaled steroids: Secondary | ICD-10-CM | POA: Insufficient documentation

## 2019-06-16 DIAGNOSIS — G4730 Sleep apnea, unspecified: Secondary | ICD-10-CM | POA: Diagnosis not present

## 2019-06-16 DIAGNOSIS — Z86711 Personal history of pulmonary embolism: Secondary | ICD-10-CM | POA: Diagnosis not present

## 2019-06-16 HISTORY — PX: CYSTOSCOPY WITH RETROGRADE PYELOGRAM, URETEROSCOPY AND STENT PLACEMENT: SHX5789

## 2019-06-16 LAB — PROTIME-INR
INR: 1.1 (ref 0.8–1.2)
Prothrombin Time: 13.8 seconds (ref 11.4–15.2)

## 2019-06-16 SURGERY — CYSTOURETEROSCOPY, WITH RETROGRADE PYELOGRAM AND STENT INSERTION
Anesthesia: General | Laterality: Right

## 2019-06-16 MED ORDER — MIDAZOLAM HCL 2 MG/2ML IJ SOLN
INTRAMUSCULAR | Status: AC
  Filled 2019-06-16: qty 2

## 2019-06-16 MED ORDER — IOHEXOL 300 MG/ML  SOLN
INTRAMUSCULAR | Status: DC | PRN
  Administered 2019-06-16: 4 mL

## 2019-06-16 MED ORDER — PROMETHAZINE HCL 25 MG/ML IJ SOLN: 6.2500 mg | INTRAMUSCULAR | Status: DC | PRN

## 2019-06-16 MED ORDER — OXYCODONE HCL 5 MG/5ML PO SOLN: 5.0000 mg | Freq: Once | ORAL | Status: AC | PRN

## 2019-06-16 MED ORDER — LACTATED RINGERS IV SOLN: INTRAVENOUS | Status: DC

## 2019-06-16 MED ORDER — ACETAMINOPHEN 160 MG/5ML PO SOLN: 325.0000 mg | Freq: Once | ORAL | Status: DC | PRN

## 2019-06-16 MED ORDER — HYDROCODONE-ACETAMINOPHEN 5-325 MG PO TABS: 1.0000 | ORAL_TABLET | ORAL | 0 refills | Status: AC | PRN

## 2019-06-16 MED ORDER — LACTATED RINGERS IV SOLN
INTRAVENOUS | Status: DC
  Administered 2019-06-16: 07:00:00 via INTRAVENOUS

## 2019-06-16 MED ORDER — MEPERIDINE HCL 50 MG/ML IJ SOLN: 6.2500 mg | INTRAMUSCULAR | Status: DC | PRN

## 2019-06-16 MED ORDER — MIDAZOLAM HCL 5 MG/5ML IJ SOLN
INTRAMUSCULAR | Status: DC | PRN
  Administered 2019-06-16: 2 mg via INTRAVENOUS

## 2019-06-16 MED ORDER — FENTANYL CITRATE (PF) 100 MCG/2ML IJ SOLN
25.0000 ug | INTRAMUSCULAR | Status: DC | PRN
  Administered 2019-06-16 (×2): 50 ug via INTRAVENOUS

## 2019-06-16 MED ORDER — PROPOFOL 10 MG/ML IV BOLUS
INTRAVENOUS | Status: DC | PRN
  Administered 2019-06-16: 200 mg via INTRAVENOUS

## 2019-06-16 MED ORDER — ONDANSETRON HCL 4 MG/2ML IJ SOLN
INTRAMUSCULAR | Status: DC | PRN
  Administered 2019-06-16: 4 mg via INTRAVENOUS

## 2019-06-16 MED ORDER — LIDOCAINE 2% (20 MG/ML) 5 ML SYRINGE
INTRAMUSCULAR | Status: AC
  Filled 2019-06-16: qty 5

## 2019-06-16 MED ORDER — LIDOCAINE HCL (CARDIAC) PF 100 MG/5ML IV SOSY
PREFILLED_SYRINGE | INTRAVENOUS | Status: DC | PRN
  Administered 2019-06-16: 60 mg via INTRAVENOUS
  Administered 2019-06-16: 40 mg via INTRAVENOUS

## 2019-06-16 MED ORDER — FENTANYL CITRATE (PF) 100 MCG/2ML IJ SOLN
INTRAMUSCULAR | Status: AC
  Filled 2019-06-16: qty 2

## 2019-06-16 MED ORDER — CEFAZOLIN SODIUM-DEXTROSE 2-4 GM/100ML-% IV SOLN
INTRAVENOUS | Status: AC
  Filled 2019-06-16: qty 200

## 2019-06-16 MED ORDER — FENTANYL CITRATE (PF) 250 MCG/5ML IJ SOLN
INTRAMUSCULAR | Status: DC | PRN
  Administered 2019-06-16: 50 ug via INTRAVENOUS
  Administered 2019-06-16 (×2): 25 ug via INTRAVENOUS

## 2019-06-16 MED ORDER — SODIUM CHLORIDE 0.9 % IR SOLN
Status: DC | PRN
  Administered 2019-06-16: 3000 mL via INTRAVESICAL

## 2019-06-16 MED ORDER — ACETAMINOPHEN 325 MG PO TABS: 325.0000 mg | ORAL_TABLET | Freq: Once | ORAL | Status: DC | PRN

## 2019-06-16 MED ORDER — ONDANSETRON HCL 4 MG/2ML IJ SOLN
INTRAMUSCULAR | Status: AC
  Filled 2019-06-16: qty 2

## 2019-06-16 MED ORDER — PROPOFOL 10 MG/ML IV BOLUS
INTRAVENOUS | Status: AC
  Filled 2019-06-16: qty 20

## 2019-06-16 MED ORDER — ACETAMINOPHEN 10 MG/ML IV SOLN
1000.0000 mg | Freq: Once | INTRAVENOUS | Status: DC | PRN
  Administered 2019-06-16: 1000 mg via INTRAVENOUS

## 2019-06-16 MED ORDER — ACETAMINOPHEN 10 MG/ML IV SOLN
INTRAVENOUS | Status: AC
  Filled 2019-06-16: qty 100

## 2019-06-16 MED ORDER — OXYCODONE HCL 5 MG PO TABS
ORAL_TABLET | ORAL | Status: AC
  Administered 2019-06-16: 5 mg via ORAL
  Filled 2019-06-16: qty 1

## 2019-06-16 MED ORDER — FENTANYL CITRATE (PF) 250 MCG/5ML IJ SOLN
INTRAMUSCULAR | Status: AC
  Filled 2019-06-16: qty 5

## 2019-06-16 MED ORDER — OXYCODONE HCL 5 MG PO TABS
5.0000 mg | ORAL_TABLET | Freq: Once | ORAL | Status: AC | PRN
  Administered 2019-06-16: 10:00:00 5 mg via ORAL

## 2019-06-16 MED ORDER — DEXAMETHASONE SODIUM PHOSPHATE 10 MG/ML IJ SOLN
INTRAMUSCULAR | Status: AC
  Filled 2019-06-16: qty 1

## 2019-06-16 SURGICAL SUPPLY — 22 items
BAG URO CATCHER STRL LF (MISCELLANEOUS) ×3 IMPLANT
BASKET LASER NITINOL 1.9FR (BASKET) IMPLANT
BASKET ZERO TIP NITINOL 2.4FR (BASKET) IMPLANT
CATH INTERMIT  6FR 70CM (CATHETERS) ×3 IMPLANT
CLOTH BEACON ORANGE TIMEOUT ST (SAFETY) ×3 IMPLANT
EXTRACTOR STONE 1.7FRX115CM (UROLOGICAL SUPPLIES) IMPLANT
FIBER LASER FLEXIVA 365 (UROLOGICAL SUPPLIES) IMPLANT
FIBER LASER TRAC TIP (UROLOGICAL SUPPLIES) ×3 IMPLANT
GLOVE BIO SURGEON STRL SZ7.5 (GLOVE) ×3 IMPLANT
GOWN STRL REUS W/TWL XL LVL3 (GOWN DISPOSABLE) ×3 IMPLANT
GUIDEWIRE ANG ZIPWIRE 038X150 (WIRE) IMPLANT
GUIDEWIRE STR DUAL SENSOR (WIRE) ×6 IMPLANT
KIT TURNOVER KIT A (KITS) IMPLANT
MANIFOLD NEPTUNE II (INSTRUMENTS) ×3 IMPLANT
PACK CYSTO (CUSTOM PROCEDURE TRAY) ×3 IMPLANT
PENCIL SMOKE EVACUATOR (MISCELLANEOUS) IMPLANT
SHEATH URETERAL 12FRX28CM (UROLOGICAL SUPPLIES) IMPLANT
SHEATH URETERAL 12FRX35CM (MISCELLANEOUS) ×3 IMPLANT
STENT URET 6FRX26 CONTOUR (STENTS) ×3 IMPLANT
TUBING CONNECTING 10 (TUBING) ×2 IMPLANT
TUBING CONNECTING 10' (TUBING) ×1
TUBING UROLOGY SET (TUBING) ×3 IMPLANT

## 2019-06-16 NOTE — Anesthesia Postprocedure Evaluation (Signed)
Anesthesia Post Note  Patient: Haidyn A Nault  Procedure(s) Performed: CYSTOSCOPY WITH RIGHT  RETROGRADE PYELOGRAM, URETEROSCOPY HOLMIUM LASER AND STENT PLACEMENT (Right )     Patient location during evaluation: PACU Anesthesia Type: General Level of consciousness: awake and alert Pain management: pain level controlled Vital Signs Assessment: post-procedure vital signs reviewed and stable Respiratory status: spontaneous breathing, nonlabored ventilation, respiratory function stable and patient connected to nasal cannula oxygen Cardiovascular status: blood pressure returned to baseline and stable Postop Assessment: no apparent nausea or vomiting Anesthetic complications: no    Last Vitals:  Vitals:   06/16/19 0945 06/16/19 1010  BP: 115/87 115/85  Pulse: 61 77  Resp: 16 14  Temp:    SpO2: 92% 95%    Last Pain:  Vitals:   06/16/19 1024  TempSrc:   PainSc: 3                  Effie Berkshire

## 2019-06-16 NOTE — H&P (Signed)
CC/HPI: CC: Gross hematuria  HPI:  53 year old male presenting to the emergency department with gross hematuria. He had a CT without contrast that revealed a 6 mm ureteropelvic junction calculus with no hydronephrosis. He denies a previous history of stones. He denies family history of prostate cancer. He denies any pain.   05/23/2019  Patient returns after undergoing a CT IVP. This revealed persistence of a 7 mm right ureteropelvic junction calculus. It is visible on [facility] imaging. He has occasional right-sided flank pain. He has had no further gross hematuria. He presents for cystoscopy.     ALLERGIES: Clindamycin    MEDICATIONS: Warfarin Sodium  Albuterol Sulfate  Cymbalta  Gabapentin  Singulair  Spiriva  Symbicort  Trazodone Hcl     GU PSH: Locm 300-399Mg /Ml Iodine,1Ml - 05/16/2019     NON-GU PSH: Foot surgery (unspecified), Left         GU PMH: Gross hematuria - 05/02/2019 Renal calculus    NON-GU PMH: Anxiety Arthritis Asthma Cardiac murmur, unspecified DVT, History GERD Heart disease, unspecified Pulmonary Embolism, History    FAMILY HISTORY: 1 son - Other Cancer - Father Tuberculosis - Runs in Family   SOCIAL HISTORY: Marital Status: Divorced Preferred Language: English; Race: White Current Smoking Status: Patient smokes.  <DIV'  Tobacco Use Assessment Completed:  Used Tobacco in last 30 days?   Drinks 1 caffeinated drink per day.    REVIEW OF SYSTEMS:     GU Review Male:  Patient denies frequent urination, hard to postpone urination, burning/ pain with urination, get up at night to urinate, leakage of urine, stream starts and stops, trouble starting your stream, have to strain to urinate , erection problems, and penile pain.    Gastrointestinal (Upper):  Patient denies nausea, vomiting, and indigestion/ heartburn.    Gastrointestinal (Lower):  Patient denies diarrhea and constipation.    Constitutional:  Patient denies fever, night sweats, weight loss,  and fatigue.    Skin:  Patient denies skin rash/ lesion and itching.    Eyes:  Patient denies blurred vision and double vision.    Ears/ Nose/ Throat:  Patient denies sore throat and sinus problems.    Hematologic/Lymphatic:  Patient denies easy bruising and swollen glands.    Cardiovascular:  Patient denies leg swelling and chest pains.    Respiratory:  Patient denies cough and shortness of breath.    Endocrine:  Patient denies excessive thirst.    Musculoskeletal:  Patient denies back pain and joint pain.    Neurological:  Patient denies headaches and dizziness.    Psychologic:  Patient denies depression and anxiety.    VITAL SIGNS:       05/23/2019 08:49 AM     BP 130/86 mmHg     Pulse 86 /min     Temperature 97.5 F / 36.3 C     GU PHYSICAL EXAMINATION:      Penis: Circumcised, no warts, no cracks. No dorsal Peyronie's plaques, no left corporal Peyronie's plaques, no right corporal Peyronie's plaques, no scarring, no warts. No balanitis, no meatal stenosis.     MULTI-SYSTEM PHYSICAL EXAMINATION:      Constitutional: Well-nourished. No physical deformities. Normally developed. Good grooming.     Respiratory: No labored breathing, no use of accessory muscles.      Cardiovascular: Normal temperature, adequate perfusion of extremities     Skin: No paleness, no jaundice     Neurologic / Psychiatric: Oriented to time, oriented to place, oriented to person. No depression, no anxiety,  no agitation.     Gastrointestinal: No mass, no tenderness, no rigidity, non obese abdomen.     Eyes: Normal conjunctivae. Normal eyelids.     Musculoskeletal: Normal gait and station of head and neck.            PAST DATA REVIEWED:   Source Of History:  Patient  Records Review:  Previous Patient Records  X-Ray Review: C.T. Abdomen/Pelvis: Reviewed Films. Reviewed Report. Discussed With Patient.      05/02/19  PSA  Total PSA 0.37 ng/mL    PROCEDURES:    Flexible Cystoscopy - 52000  Risks,  benefits, and some of the potential complications of the procedure were discussed at length with the patient including infection, bleeding, voiding discomfort, urinary retention, fever, chills, sepsis, and others. All questions were answered. Informed consent was obtained. Antibiotic prophylaxis was given. Sterile technique and intraurethral analgesia were used.  Meatus:  Normal size. Normal location. Normal condition.  Urethra:  No strictures.  External Sphincter:  Normal.  Verumontanum:  Normal.  Prostate:  Borderline obstructing. Mild hyperplasia.  Bladder Neck:  Non-obstructing.  Ureteral Orifices:  Normal location. Normal size. Normal shape.   Bladder:  No trabeculation. No tumors. Normal mucosa. No stones.      The lower urinary tract was carefully examined. The procedure was well-tolerated and without complications. Antibiotic instructions were given. Instructions were given to call the office immediately for bloody urine, difficulty urinating, urinary retention, painful or frequent urination, fever, chills, nausea, vomiting or other illness. The patient stated that he understood these instructions and would comply with them.    Urinalysis w/Scope  Dipstick Dipstick Cont'd Micro  Color: Yellow Bilirubin: Neg mg/dL WBC/hpf: 6 - 10/hpf  Appearance: Clear Ketones: Neg mg/dL RBC/hpf: 3 - 10/hpf  Specific Gravity: 1.025 Blood: 1+ ery/uL Bacteria: NS (Not Seen)  pH: 5.5 Protein: Neg mg/dL Cystals: NS (Not Seen)  Glucose: Trace mg/dL Urobilinogen: 0.2 mg/dL Casts: NS (Not Seen)   Nitrites: Neg Trichomonas: Not Present   Leukocyte Esterase: Neg leu/uL Mucous: Not Present    Epithelial Cells: NS (Not Seen)    Yeast: NS (Not Seen)    Sperm: Not Present    ASSESSMENT:     ICD-10 Details  1 GU:  Renal calculus - N20.0   2  Gross hematuria - R31.0    PLAN:   Orders  Labs Urine Culture  Document  Letter(s):  Created for Patient: Clinical Summary   Notes:  We discussed the management  of urinary stones. These options include observation, ureteroscopy, and shockwave lithotripsy. We discussed which options are relevant to these particular stones. We discussed the natural history of stones as well as the complications of untreated stones and the impact on quality of life without treatment as well as with each of the above listed treatments. We also discussed the efficacy of each treatment in its ability to clear the stone burden. With any of these management options I discussed the signs and symptoms of infection and the need for emergent treatment should these be experienced. For each option we discussed the ability of each procedure to clear the patient of their stone burden.   For observation I described the risks which include but are not limited to silent renal damage, life-threatening infection, need for emergent surgery, failure to pass stone, and pain.   For ureteroscopy I described the risks which include heart attack, stroke, pulmonary embolus, death, bleeding, infection, damage to contiguous structures, positioning injury, ureteral stricture, ureteral avulsion, ureteral injury,  need for ureteral stent, inability to perform ureteroscopy, need for an interval procedure, inability to clear stone burden, stent discomfort and pain.   For shockwave lithotripsy I described the risks which include arrhythmia, kidney contusion, kidney hemorrhage, need for transfusion, pain, inability to break up stone, inability to pass stone fragments, Steinstr[place], infection associated with obstructing stones, need for different surgical procedure, need for repeat shockwave lithotripsy.   He would like to proceed with ureteroscopy.      Signed by Link Snuffer, III, M.D. on 05/23/19 at 9:08 AM (EST

## 2019-06-16 NOTE — Op Note (Signed)
Operative Note  Preoperative diagnosis:  1.  Right renal calculus  Postoperative diagnosis: 1.  Right renal calculus 2.  Meatal stenosis  Procedure(s): 1.  Cystoscopy with right retrograde pyelogram, right ureteroscopy with laser lithotripsy, ureteral stent placement 2.  Urethral dilation  Surgeon: Link Snuffer, MD  Assistants: None  Anesthesia: General  Complications: None immediate  EBL: Minimal  Specimens: 1.  None  Drains/Catheters: 1.  6 x 26 double-J ureteral stent  Intraoperative findings: 1.  Mild meatal stenosis.  Remainder urethra was normal.  Normal bladder.  2.  Retrograde pyelogram revealed evidence of filling defect at the level of the stone. 3.  Ureteroscopy revealed a 7 mm ureteral calculus fragmented to tiny fragments.  Indication: 53 year old male evaluated for gross hematuria was found to have a 7 mm ureteropelvic junction calculus and presents for the previously mentioned operation.  Description of procedure:  The patient was identified and consent was obtained.  The patient was taken to the operating room and placed in the supine position.  The patient was placed under general anesthesia.  Perioperative antibiotics were administered.  The patient was placed in dorsal lithotomy.  Patient was prepped and draped in a standard sterile fashion and a timeout was performed.  A 21 French rigid cystoscope was attempted to be advanced into the urethra but I noted that there was meatal stenosis that was very mild.  Therefore, I sequentially dilated the meatus from 14 [place] up to 26 [place].  I was then able to easily pass a 21 French rigid cystoscope into the urethra and into the bladder.  Complete cystoscopy was performed with no abnormal findings.  The right ureter was cannulated with a sensor wire which was advanced up to the kidney under fluoroscopic guidance.  A semirigid ureteroscope was advanced alongside the wire up to the proximal third of the ureter.  No stone  was seen.  I advanced a second wire through the scope and withdrew the rigid ureteroscope.  I advanced the inner portion of a 12 x 14 ureteral access sheath over one of the wires under continuous fluoroscopic guidance for passive dilation.  I then advanced the entire sheath over the wire under continuous fluoroscopic guidance.  I withdrew the inner sheath along with the wire.  I then performed flexible ureteroscopy.  The stone was encountered and laser fragmented to tiny fragments.  I performed a complete pyeloscopy and no other stone fragments were seen.  I shot a retrograde pyelogram through the scope with findings noted above.  I then withdrew the wire along with the access sheath.  There was mild trauma to the ureter from the ureteral access sheath but no full-thickness injury.  I backloaded the wire onto a rigid cystoscope and advanced that into the bladder followed by routine placement of a 6 x 26 double-J ureteral stent.  Fluoroscopy confirmed proximal placement and direct visualization confirmed a good coil within the bladder.  I drained the bladder withdrew the scope.  Patient tolerated procedure well was stable postoperative.  Plan: Follow-up in 1 week for ureteral stent removal

## 2019-06-16 NOTE — Telephone Encounter (Signed)
I have communicated instructions to the patient based upon this result.

## 2019-06-16 NOTE — Transfer of Care (Signed)
Immediate Anesthesia Transfer of Care Note  Patient: [name] [name]  Procedure(s) Performed: CYSTOSCOPY WITH RIGHT  RETROGRADE PYELOGRAM, URETEROSCOPY HOLMIUM LASER AND STENT PLACEMENT (Right )  Patient Location: PACU  Anesthesia Type:General  Level of Consciousness: drowsy, patient cooperative and responds to stimulation  Airway & Oxygen Therapy: Patient Spontanous Breathing and Patient connected to face mask oxygen  Post-op Assessment: Report given to RN and Post -op Vital signs reviewed and stable  Post vital signs: Reviewed and stable  Last Vitals:  Vitals Value Taken Time  BP 151/103 06/16/19 0842  Temp    Pulse 83 06/16/19 0842  Resp 12 06/16/19 0842  SpO2 98 % 06/16/19 0842  Vitals shown include unvalidated device data.  Last Pain:  Vitals:   06/16/19 0637  TempSrc:   PainSc: 0-No pain         Complications: No apparent anesthesia complications

## 2019-06-16 NOTE — Anesthesia Procedure Notes (Signed)
Procedure Name: LMA Insertion Date/Time: 06/16/2019 7:46 AM Performed by: Glory Buff, CRNA Pre-anesthesia Checklist: Patient identified, Emergency Drugs available, Suction available and Patient being monitored Patient Re-evaluated:Patient Re-evaluated prior to induction Oxygen Delivery Method: Circle system utilized Preoxygenation: Pre-oxygenation with 100% oxygen Induction Type: IV induction Ventilation: Mask ventilation without difficulty LMA: LMA inserted Number of attempts: 1 Placement Confirmation: positive ETCO2 Tube secured with: Tape Dental Injury: Teeth and Oropharynx as per pre-operative assessment

## 2019-06-16 NOTE — Anesthesia Preprocedure Evaluation (Addendum)
Anesthesia Evaluation  Patient identified by MRN, date of birth, ID band Patient awake    Reviewed: Allergy & Precautions, NPO status , Patient's Chart, lab work & pertinent test results  Airway Mallampati: I  TM Distance: >3 FB Neck ROM: Full    Dental  (+) Partial Upper   Pulmonary asthma , sleep apnea , Current Smoker,    breath sounds clear to auscultation       Cardiovascular +CHF   Rhythm:Regular Rate:Normal     Neuro/Psych PSYCHIATRIC DISORDERS Depression Bipolar Disorder    GI/Hepatic Neg liver ROS, GERD  ,  Endo/Other  negative endocrine ROS  Renal/GU negative Renal ROS     Musculoskeletal   Abdominal Normal abdominal exam  (+)   Peds  Hematology negative hematology ROS (+)   Anesthesia Other Findings   Reproductive/Obstetrics                            Anesthesia Physical Anesthesia Plan  ASA: II  Anesthesia Plan: General   Post-op Pain Management:    Induction: Intravenous  PONV Risk Score and Plan: 2 and Ondansetron and Midazolam  Airway Management Planned: LMA  Additional Equipment: None  Intra-op Plan:   Post-operative Plan: Extubation in OR  Informed Consent: I have reviewed the patients History and Physical, chart, labs and discussed the procedure including the risks, benefits and alternatives for the proposed anesthesia with the patient or authorized representative who has indicated his/her understanding and acceptance.     Dental advisory given  Plan Discussed with: CRNA  Anesthesia Plan Comments:        Anesthesia Quick Evaluation

## 2019-06-16 NOTE — Discharge Instructions (Signed)

## 2019-06-17 ENCOUNTER — Encounter (HOSPITAL_COMMUNITY): Payer: Self-pay | Admitting: Urology

## 2019-06-23 DIAGNOSIS — N200 Calculus of kidney: Secondary | ICD-10-CM | POA: Diagnosis not present

## 2019-07-21 ENCOUNTER — Telehealth: Payer: Self-pay | Admitting: Pharmacist

## 2019-07-21 NOTE — Telephone Encounter (Signed)
Patient texted me his PST FS POC INR results = 1.6 on 70mg  warfarin/wk. Have INCREASED to 77.5mg  warfarin/wk (2 and 1/2 x 5mg  M/W/F; 2 x 5mg  all other days). Repeat PST FS INR in 2 weeks.

## 2019-07-22 ENCOUNTER — Telehealth: Payer: Self-pay | Admitting: *Deleted

## 2019-07-22 NOTE — Telephone Encounter (Signed)
Out of range INR reported this am: INR 1.6 done 1/11 this was noted 1/11 by dr groce and orders given to pt by dr groce

## 2019-08-03 ENCOUNTER — Other Ambulatory Visit: Payer: Self-pay | Admitting: Pharmacist

## 2019-08-03 DIAGNOSIS — I82511 Chronic embolism and thrombosis of right femoral vein: Secondary | ICD-10-CM

## 2019-08-03 DIAGNOSIS — I2699 Other pulmonary embolism without acute cor pulmonale: Secondary | ICD-10-CM

## 2019-09-17 ENCOUNTER — Telehealth: Payer: Self-pay

## 2019-09-17 NOTE — Telephone Encounter (Signed)
Received fax INR results from [facility] for patient PT/INR Self-testing Summary Report dated 03/11/2019 - 09/17/2019.  Last INR result dated 09/16/19 is 2.1  Will forward to Dr. Elie Confer and PCP, as well as, place hard copy result in Dr. Gladstone Pih box. Thank you, SChaplin, RN,BSN

## 2019-09-22 ENCOUNTER — Telehealth: Payer: Self-pay | Admitting: Pharmacist

## 2019-09-22 NOTE — Telephone Encounter (Signed)
Patient was contacted in relation to a faxed document showing INR of 2.1 on 9-MAR-21. States he is having his warfarin adjusted now by a provider at [facility]. Advised the patient to contact his device provider and apprise them of this change in provider so that the provider managing his warfarin will get these fax results.

## 2019-10-03 ENCOUNTER — Other Ambulatory Visit: Payer: Self-pay | Admitting: Internal Medicine

## 2019-10-03 DIAGNOSIS — J4540 Moderate persistent asthma, uncomplicated: Secondary | ICD-10-CM

## 2019-10-03 DIAGNOSIS — K219 Gastro-esophageal reflux disease without esophagitis: Secondary | ICD-10-CM

## 2019-10-03 DIAGNOSIS — M5416 Radiculopathy, lumbar region: Secondary | ICD-10-CM

## 2019-10-06 NOTE — Telephone Encounter (Signed)
Spoke with the patient.  He has transferred his Care to [facility] and is no longer a Patient.

## 2019-10-09 ENCOUNTER — Telehealth: Payer: Self-pay

## 2019-10-09 NOTE — Telephone Encounter (Signed)
Opened in error. SChaplin, RN,BSN  

## 2019-11-10 ENCOUNTER — Other Ambulatory Visit: Payer: Self-pay | Admitting: Internal Medicine

## 2019-11-10 DIAGNOSIS — J4540 Moderate persistent asthma, uncomplicated: Secondary | ICD-10-CM

## 2019-12-02 ENCOUNTER — Other Ambulatory Visit: Payer: Self-pay | Admitting: Pharmacist

## 2019-12-02 DIAGNOSIS — I82511 Chronic embolism and thrombosis of right femoral vein: Secondary | ICD-10-CM

## 2019-12-02 DIAGNOSIS — I2699 Other pulmonary embolism without acute cor pulmonale: Secondary | ICD-10-CM

## 2019-12-03 ENCOUNTER — Other Ambulatory Visit: Payer: Self-pay | Admitting: Internal Medicine

## 2019-12-03 DIAGNOSIS — J45909 Unspecified asthma, uncomplicated: Secondary | ICD-10-CM

## 2019-12-15 IMAGING — CT CT ANGIO CHEST
4 of 5 series · 17 of 46 positions shown · IV contrast (APPLIED)
Comparison: None.

CLINICAL DATA: Left chest and abdomen pain. Slight elevated lipase.
History of chronic DVT.

EXAM:
CT ANGIOGRAPHY CHEST
CT ABDOMEN AND PELVIS WITH CONTRAST
TECHNIQUE: Multidetector CT imaging of the chest was performed using the
standard protocol during bolus administration of intravenous
contrast. Multiplanar CT image reconstructions and MIPs were
obtained to evaluate the vascular anatomy. Multidetector CT imaging
of the abdomen and pelvis was performed using the standard protocol
during bolus administration of intravenous contrast.
CONTRAST:  100mL [id number] IOPAMIDOL ([id number]) INJECTION 76%

[Series 3: abdomen 5.0 · axial · 0.78mm/px · z∈[+991,+1066]mm · 2 of 107 slices shown]
[im 8/107  lung]
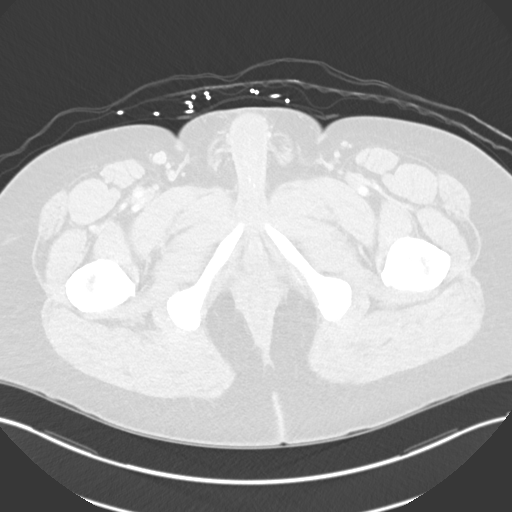
[im 23/107  lung]
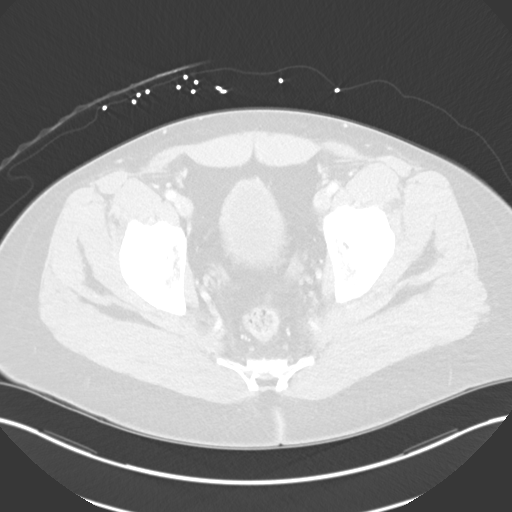

[Series 5: lung · axial · 0.78mm/px · z∈[+1348,+1470]mm · 8 of 77 slices shown]
[im 8/77  soft-tissue]
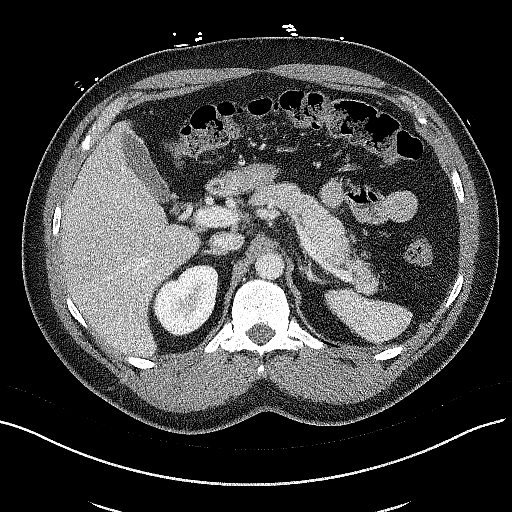
[im 16/77  soft-tissue]
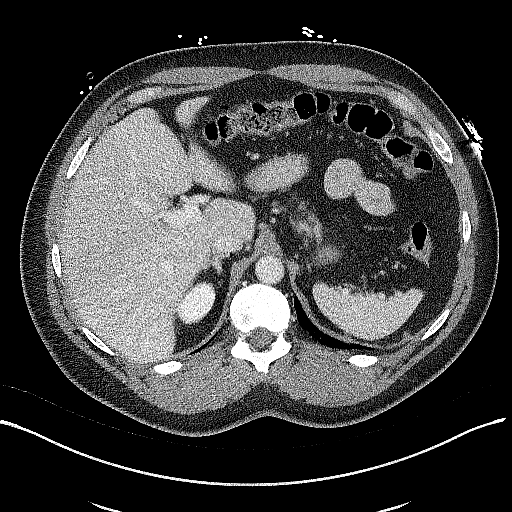
[im 23/77  soft-tissue]
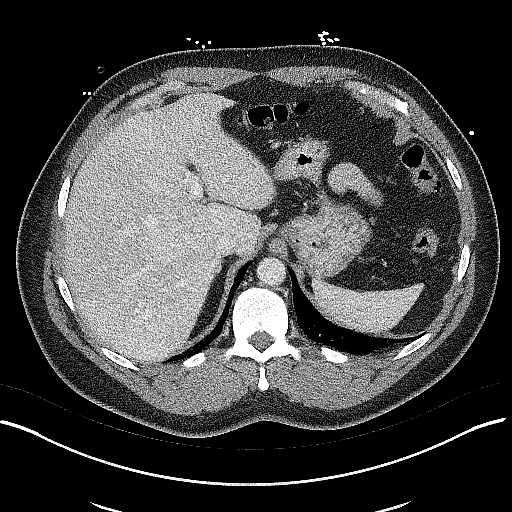
[im 31/77  soft-tissue]
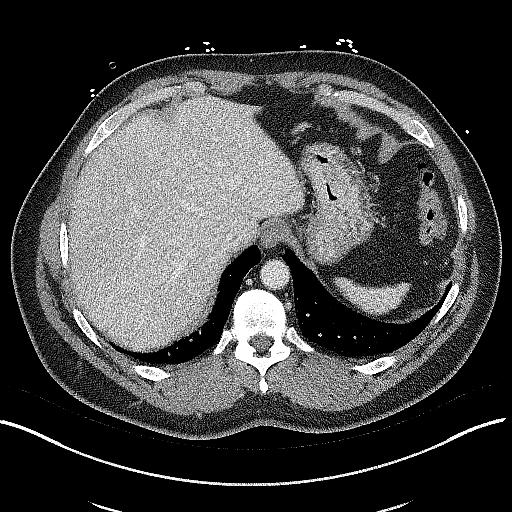
[im 46/77  soft-tissue]
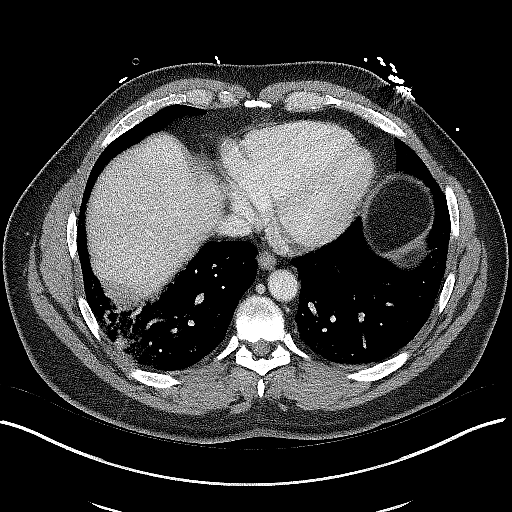
[im 54/77  soft-tissue]
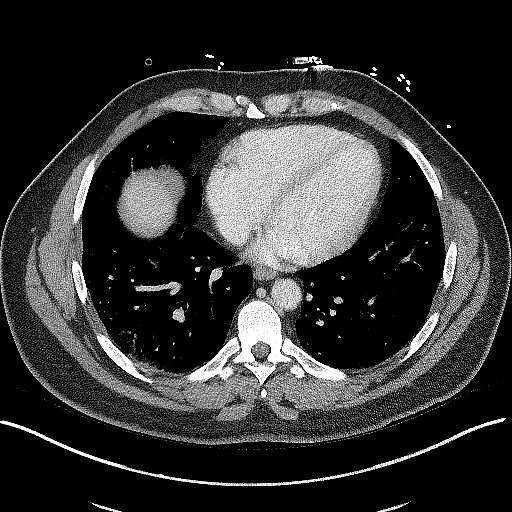
[im 61/77  soft-tissue]
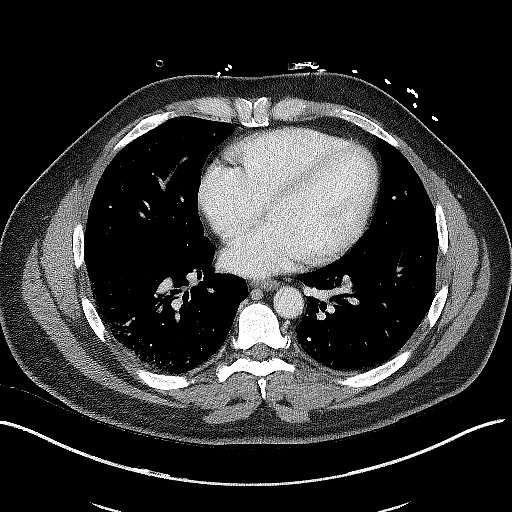
[im 69/77  soft-tissue]
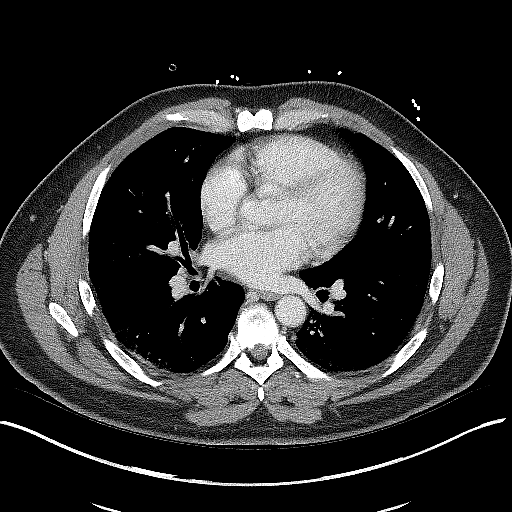

[Series 6: abdomen 3.0 mpr cor · coronal · 0.75mm/px · 3 of 92 slices shown]
[im 31/92  soft-tissue]
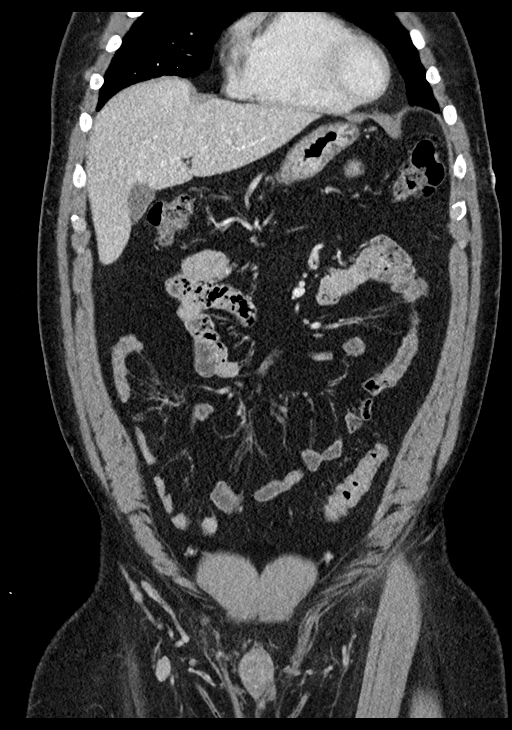
[im 41/92  soft-tissue]
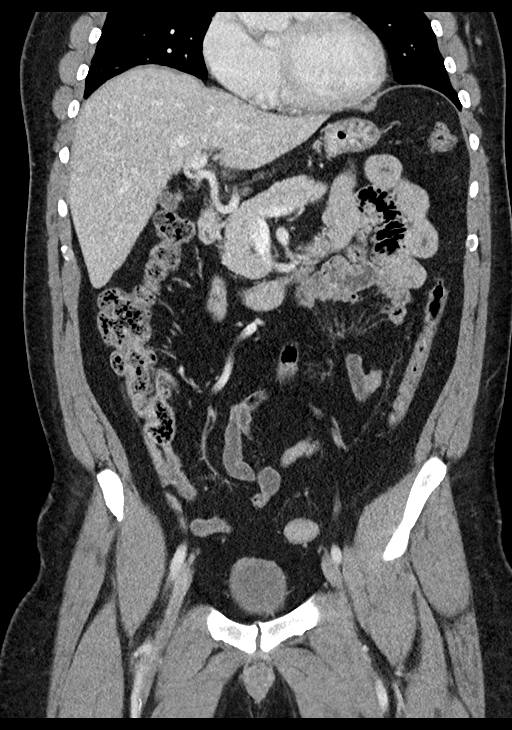
[im 51/92  soft-tissue]
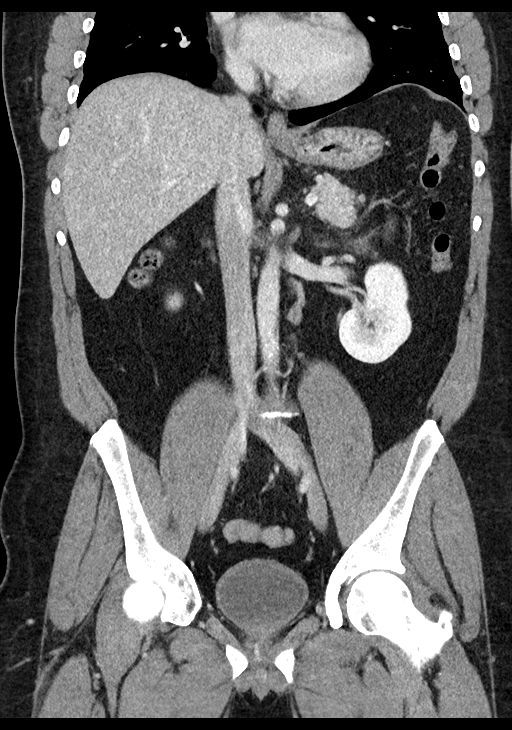

[Series 8: delay 5.0 br40 1 · axial · delayed · 0.78mm/px · z∈[+1227,+1357]mm · 4 of 44 slices shown]
[im 9/44  lung]
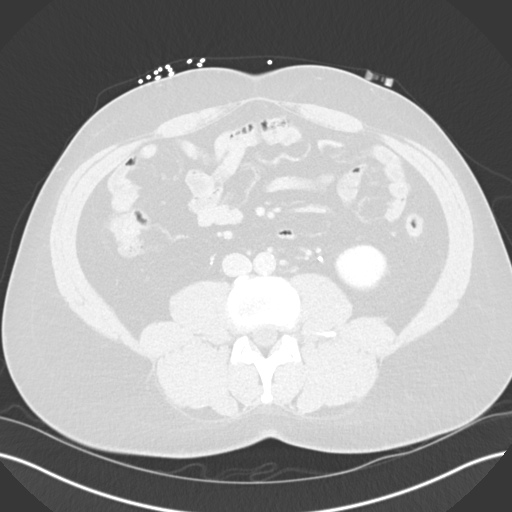
[im 18/44  soft-tissue]
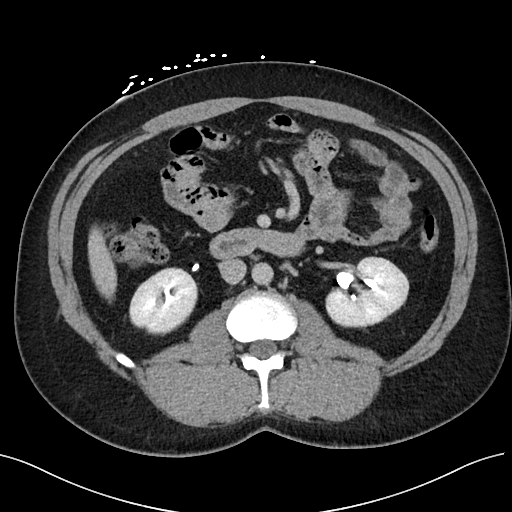
[im 26/44  lung]
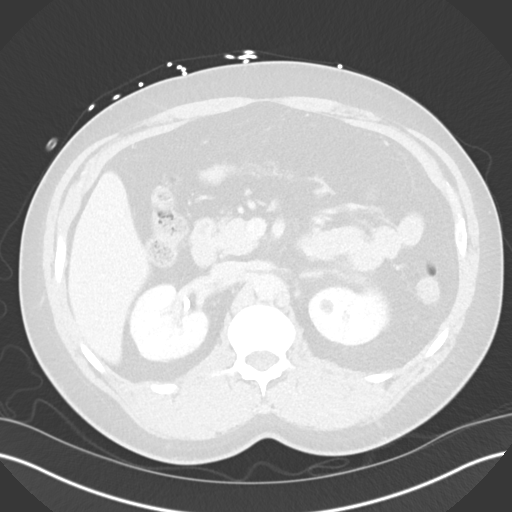
[im 35/44  soft-tissue]
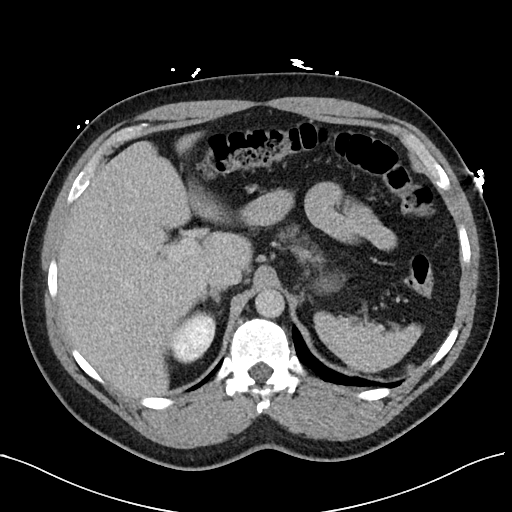

[17 of 46 positions shown; findings below may reference images not displayed]

FINDINGS: CTA CHEST FINDINGS

Cardiovascular: Satisfactory opacification of the pulmonary arteries
to the segmental level. No evidence of pulmonary embolism. Normal
heart size. No pericardial effusion.

Mediastinum/Nodes: No enlarged mediastinal, hilar, or axillary lymph
nodes. Thyroid gland, trachea, and esophagus demonstrate no
significant findings.

Lungs/Pleura: Patchy consolidation right lower lobe is identified.
Mild atelectasis of posterior left lung base is noted. Minimal
bilateral pleural effusions are identified.

Musculoskeletal: No acute abnormality.

Review of the MIP images confirms the above findings.

CT ABDOMEN and PELVIS FINDINGS

Hepatobiliary: No focal liver abnormality is seen. No gallstones,
gallbladder wall thickening, or biliary dilatation.

Pancreas: Unremarkable. No pancreatic ductal dilatation or
surrounding inflammatory changes.

Spleen: Normal in size without focal abnormality.

Adrenals/Urinary Tract: Bilateral adrenal glands are normal. Small
bilateral kidney cysts are noted. No hydronephrosis is identified
bilaterally. The bladder is normal.

Stomach/Bowel: Stomach is within normal limits. Appendix appears
normal. No evidence of bowel wall thickening, distention, or
inflammatory changes.

Vascular/Lymphatic: Aortic atherosclerosis. No enlarged abdominal or
pelvic lymph nodes.

Reproductive: Prostate calcifications are noted.

Other: Small umbilical herniation of mesenteric fat.

Musculoskeletal: No acute abnormality

Review of the MIP images confirms the above findings.
IMPRESSION: No pulmonary embolus.

Right lower lobe pneumonia.

No acute abnormality identified in the abdomen and pelvis. No
evidence of pancreatitis.

## 2019-12-16 ENCOUNTER — Telehealth: Payer: Self-pay | Admitting: *Deleted

## 2019-12-16 NOTE — Telephone Encounter (Signed)
Is he still seen in our clinic by our physician(s)?

## 2019-12-16 NOTE — Telephone Encounter (Signed)
INR 12/16/2019 at 1415 is 2.1

## 2019-12-17 NOTE — Telephone Encounter (Signed)
THIS PT HAS TRANSFERRED HIS CARE TO [facility] FOR HIS PCP. NO LONGER [facility] PT

## 2019-12-30 ENCOUNTER — Other Ambulatory Visit: Payer: Self-pay | Admitting: Internal Medicine

## 2019-12-30 DIAGNOSIS — M5416 Radiculopathy, lumbar region: Secondary | ICD-10-CM

## 2020-01-12 ENCOUNTER — Other Ambulatory Visit: Payer: Self-pay | Admitting: Internal Medicine

## 2020-01-12 DIAGNOSIS — J449 Chronic obstructive pulmonary disease, unspecified: Secondary | ICD-10-CM

## 2020-03-09 ENCOUNTER — Telehealth: Payer: Self-pay | Admitting: *Deleted

## 2020-03-09 NOTE — Telephone Encounter (Signed)
Opened in error

## 2020-03-28 ENCOUNTER — Other Ambulatory Visit: Payer: Self-pay | Admitting: Internal Medicine

## 2020-03-28 DIAGNOSIS — K219 Gastro-esophageal reflux disease without esophagitis: Secondary | ICD-10-CM

## 2020-03-28 DIAGNOSIS — M5416 Radiculopathy, lumbar region: Secondary | ICD-10-CM

## 2020-04-21 ENCOUNTER — Telehealth: Payer: Self-pay | Admitting: *Deleted

## 2020-04-22 NOTE — Telephone Encounter (Signed)
Error

## 2020-08-09 ENCOUNTER — Telehealth: Payer: Self-pay | Admitting: Pharmacist

## 2020-08-09 NOTE — Telephone Encounter (Signed)
Was provided results of PST FS POC INR collected by patient on 31-JAN-22 at 1011h. INR = 3.6 (target 2.0 -3.0) His warfarin is being managed by the [facility] at [facility] in [place]. I am contacting the device provider, [facility] and alerting them they need to provide these reports to his [facility] Provider. Coumad[facility] Clinic phone number is [phone]. [facility] phone number is [phone].

## 2021-04-29 ENCOUNTER — Encounter: Payer: Self-pay | Admitting: Gastroenterology
